# Patient Record
Sex: Male | Born: 1970
Health system: Southern US, Community
[De-identification: ages and names within clinical notes are randomized; demographics above are authoritative.]

## PROBLEM LIST (undated history)

## (undated) DIAGNOSIS — A419 Sepsis, unspecified organism: Secondary | ICD-10-CM

## (undated) DIAGNOSIS — Z992 Dependence on renal dialysis: Secondary | ICD-10-CM

## (undated) DIAGNOSIS — R112 Nausea with vomiting, unspecified: Secondary | ICD-10-CM

## (undated) DIAGNOSIS — I10 Essential (primary) hypertension: Secondary | ICD-10-CM

## (undated) DIAGNOSIS — E7800 Pure hypercholesterolemia, unspecified: Secondary | ICD-10-CM

## (undated) DIAGNOSIS — R000 Tachycardia, unspecified: Secondary | ICD-10-CM

## (undated) DIAGNOSIS — L0390 Cellulitis, unspecified: Secondary | ICD-10-CM

## (undated) DIAGNOSIS — E109 Type 1 diabetes mellitus without complications: Secondary | ICD-10-CM

## (undated) DIAGNOSIS — Z9889 Other specified postprocedural states: Secondary | ICD-10-CM

## (undated) DIAGNOSIS — E039 Hypothyroidism, unspecified: Secondary | ICD-10-CM

## (undated) DIAGNOSIS — G459 Transient cerebral ischemic attack, unspecified: Secondary | ICD-10-CM

## (undated) DIAGNOSIS — N182 Chronic kidney disease, stage 2 (mild): Secondary | ICD-10-CM

## (undated) DIAGNOSIS — K3184 Gastroparesis: Secondary | ICD-10-CM

## (undated) DIAGNOSIS — Z9641 Presence of insulin pump (external) (internal): Secondary | ICD-10-CM

## (undated) DIAGNOSIS — F1910 Other psychoactive substance abuse, uncomplicated: Secondary | ICD-10-CM

## (undated) DIAGNOSIS — E1110 Type 2 diabetes mellitus with ketoacidosis without coma: Secondary | ICD-10-CM

## (undated) DIAGNOSIS — G4733 Obstructive sleep apnea (adult) (pediatric): Secondary | ICD-10-CM

## (undated) DIAGNOSIS — N186 End stage renal disease: Secondary | ICD-10-CM

## (undated) DIAGNOSIS — M5020 Other cervical disc displacement, unspecified cervical region: Secondary | ICD-10-CM

## (undated) DIAGNOSIS — R1319 Other dysphagia: Secondary | ICD-10-CM

## (undated) HISTORY — PX: HERNIA REPAIR: SHX51

## (undated) HISTORY — PX: EYE SURGERY: SHX253

## (undated) HISTORY — DX: Pure hypercholesterolemia, unspecified: E78.00

## (undated) HISTORY — PX: PERITONEAL CATHETER INSERTION: SHX2223

## (undated) HISTORY — DX: Other psychoactive substance abuse, uncomplicated: F19.10

## (undated) HISTORY — PX: OTHER SURGICAL HISTORY: SHX169

## (undated) HISTORY — PX: CATARACT EXTRACTION: SUR2

## (undated) HISTORY — DX: Morbid (severe) obesity due to excess calories: E66.01

## (undated) HISTORY — DX: End stage renal disease: N18.6

## (undated) HISTORY — DX: Other specified postprocedural states: Z98.890

## (undated) HISTORY — DX: Gastroparesis: K31.84

## (undated) HISTORY — PX: PERITONEAL CATHETER REMOVAL: SUR1106

---

## 2009-12-15 ENCOUNTER — Encounter: Admission: RE | Admit: 2009-12-15 | Discharge: 2009-12-15 | Payer: Self-pay | Admitting: Neurological Surgery

## 2009-12-17 ENCOUNTER — Encounter: Admission: RE | Admit: 2009-12-17 | Discharge: 2009-12-17 | Payer: Self-pay | Admitting: Neurosurgery

## 2010-02-10 ENCOUNTER — Emergency Department (HOSPITAL_COMMUNITY): Admission: EM | Admit: 2010-02-10 | Discharge: 2010-02-10 | Payer: Self-pay | Admitting: Emergency Medicine

## 2010-02-17 ENCOUNTER — Encounter: Admission: RE | Admit: 2010-02-17 | Discharge: 2010-02-17 | Payer: Self-pay | Admitting: Neurological Surgery

## 2010-05-21 ENCOUNTER — Inpatient Hospital Stay (HOSPITAL_COMMUNITY): Admission: EM | Admit: 2010-05-21 | Discharge: 2010-05-23 | Payer: Self-pay | Admitting: Emergency Medicine

## 2010-10-25 ENCOUNTER — Encounter: Payer: Self-pay | Admitting: Neurological Surgery

## 2010-12-18 LAB — COMPREHENSIVE METABOLIC PANEL
ALT: 14 U/L (ref 0–53)
AST: 19 U/L (ref 0–37)
Albumin: 2.9 g/dL — ABNORMAL LOW (ref 3.5–5.2)
Alkaline Phosphatase: 63 U/L (ref 39–117)
BUN: 11 mg/dL (ref 6–23)
CO2: 26 mEq/L (ref 19–32)
Calcium: 8.7 mg/dL (ref 8.4–10.5)
Chloride: 109 mEq/L (ref 96–112)
Creatinine, Ser: 0.82 mg/dL (ref 0.4–1.5)
GFR calc Af Amer: >60 mL/min (ref >60)
GFR calc non Af Amer: >60 mL/min (ref >60)
Glucose, Bld: 106 mg/dL — ABNORMAL HIGH (ref 70–99)
Potassium: 3.8 mEq/L (ref 3.5–5.1)
Sodium: 138 mEq/L (ref 135–145)
Total Bilirubin: 1 mg/dL (ref 0.3–1.2)
Total Protein: 5.9 g/dL — ABNORMAL LOW (ref 6.0–8.3)

## 2010-12-18 LAB — BASIC METABOLIC PANEL
BUN: 10 mg/dL (ref 6–23)
BUN: 19 mg/dL (ref 6–23)
CO2: 24 mEq/L (ref 19–32)
CO2: 29 mEq/L (ref 19–32)
Calcium: 9.1 mg/dL (ref 8.4–10.5)
Calcium: 9.1 mg/dL (ref 8.4–10.5)
Chloride: 105 mEq/L (ref 96–112)
Chloride: 93 mEq/L — ABNORMAL LOW (ref 96–112)
Creatinine, Ser: 0.91 mg/dL (ref 0.4–1.5)
Creatinine, Ser: 1.13 mg/dL (ref 0.4–1.5)
GFR calc Af Amer: >60 mL/min (ref >60)
GFR calc Af Amer: >60 mL/min (ref >60)
GFR calc non Af Amer: >60 mL/min (ref >60)
GFR calc non Af Amer: >60 mL/min (ref >60)
Glucose, Bld: 768 mg/dL (ref 70–99)
Glucose, Bld: 81 mg/dL (ref 70–99)
Potassium: 3.6 mEq/L (ref 3.5–5.1)
Potassium: 5.6 mEq/L — ABNORMAL HIGH (ref 3.5–5.1)
Sodium: 126 mEq/L — ABNORMAL LOW (ref 135–145)
Sodium: 140 mEq/L (ref 135–145)

## 2010-12-18 LAB — GLUCOSE, CAPILLARY
Glucose-Capillary: 114 mg/dL — ABNORMAL HIGH (ref 70–99)
Glucose-Capillary: 115 mg/dL — ABNORMAL HIGH (ref 70–99)
Glucose-Capillary: 116 mg/dL — ABNORMAL HIGH (ref 70–99)
Glucose-Capillary: 134 mg/dL — ABNORMAL HIGH (ref 70–99)
Glucose-Capillary: 139 mg/dL — ABNORMAL HIGH (ref 70–99)
Glucose-Capillary: 153 mg/dL — ABNORMAL HIGH (ref 70–99)
Glucose-Capillary: 159 mg/dL — ABNORMAL HIGH (ref 70–99)
Glucose-Capillary: 162 mg/dL — ABNORMAL HIGH (ref 70–99)
Glucose-Capillary: 168 mg/dL — ABNORMAL HIGH (ref 70–99)
Glucose-Capillary: 169 mg/dL — ABNORMAL HIGH (ref 70–99)
Glucose-Capillary: 186 mg/dL — ABNORMAL HIGH (ref 70–99)
Glucose-Capillary: 204 mg/dL — ABNORMAL HIGH (ref 70–99)
Glucose-Capillary: 218 mg/dL — ABNORMAL HIGH (ref 70–99)
Glucose-Capillary: 223 mg/dL — ABNORMAL HIGH (ref 70–99)
Glucose-Capillary: 224 mg/dL — ABNORMAL HIGH (ref 70–99)
Glucose-Capillary: 228 mg/dL — ABNORMAL HIGH (ref 70–99)
Glucose-Capillary: 361 mg/dL — ABNORMAL HIGH (ref 70–99)
Glucose-Capillary: 361 mg/dL — ABNORMAL HIGH (ref 70–99)
Glucose-Capillary: 413 mg/dL — ABNORMAL HIGH (ref 70–99)
Glucose-Capillary: 420 mg/dL — ABNORMAL HIGH (ref 70–99)
Glucose-Capillary: 466 mg/dL — ABNORMAL HIGH (ref 70–99)
Glucose-Capillary: 73 mg/dL (ref 70–99)
Glucose-Capillary: 74 mg/dL (ref 70–99)
Glucose-Capillary: 79 mg/dL (ref 70–99)
Glucose-Capillary: 92 mg/dL (ref 70–99)
Glucose-Capillary: 93 mg/dL (ref 70–99)
Glucose-Capillary: >600 mg/dL (ref 70–99)
Glucose-Capillary: >600 mg/dL (ref 70–99)

## 2010-12-18 LAB — CLOSTRIDIUM DIFFICILE EIA: C difficile Toxins A+B, EIA: NEGATIVE

## 2010-12-18 LAB — POCT CARDIAC MARKERS
CKMB, poc: 1.8 ng/mL (ref 1.0–8.0)
Myoglobin, poc: 80.1 ng/mL (ref 12–200)
Troponin i, poc: <0.05 ng/mL (ref 0.00–0.09)

## 2010-12-18 LAB — CULTURE, BLOOD (ROUTINE X 2)
Culture: NO GROWTH
Culture: NO GROWTH

## 2010-12-18 LAB — CBC
HCT: 37.5 % — ABNORMAL LOW (ref 39.0–52.0)
HCT: 42.1 % (ref 39.0–52.0)
Hemoglobin: 13.1 g/dL (ref 13.0–17.0)
Hemoglobin: 14.6 g/dL (ref 13.0–17.0)
MCH: 29.6 pg (ref 26.0–34.0)
MCH: 30.4 pg (ref 26.0–34.0)
MCHC: 34.7 g/dL (ref 30.0–36.0)
MCHC: 34.9 g/dL (ref 30.0–36.0)
MCV: 84.7 fL (ref 78.0–100.0)
MCV: 87.5 fL (ref 78.0–100.0)
Platelets: 212 10*3/uL (ref 150–400)
Platelets: 225 10*3/uL (ref 150–400)
RBC: 4.43 MIL/uL (ref 4.22–5.81)
RBC: 4.81 MIL/uL (ref 4.22–5.81)
RDW: 12.7 % (ref 11.5–15.5)
RDW: 13 % (ref 11.5–15.5)
WBC: 6.9 10*3/uL (ref 4.0–10.5)
WBC: 8.5 10*3/uL (ref 4.0–10.5)

## 2010-12-18 LAB — URINALYSIS, ROUTINE W REFLEX MICROSCOPIC
Bilirubin Urine: NEGATIVE
Glucose, UA: >1000 mg/dL — AB
Ketones, ur: 15 mg/dL — AB
Leukocytes, UA: NEGATIVE
Nitrite: NEGATIVE
Protein, ur: NEGATIVE mg/dL
Specific Gravity, Urine: 1.027 (ref 1.005–1.030)
Urobilinogen, UA: 0.2 mg/dL (ref 0.0–1.0)
pH: 5.5 (ref 5.0–8.0)

## 2010-12-18 LAB — MRSA PCR SCREENING: MRSA by PCR: NEGATIVE

## 2010-12-18 LAB — TSH: TSH: 3.583 u[IU]/mL (ref 0.350–4.500)

## 2010-12-18 LAB — MAGNESIUM: Magnesium: 2 mg/dL (ref 1.5–2.5)

## 2010-12-18 LAB — HEMOGLOBIN A1C
Hgb A1c MFr Bld: 10.9 % — ABNORMAL HIGH (ref <5.7)
Mean Plasma Glucose: 266 mg/dL — ABNORMAL HIGH (ref <117)

## 2010-12-18 LAB — LIPASE, BLOOD
Lipase: 19 U/L (ref 11–59)
Lipase: 20 U/L (ref 11–59)

## 2010-12-18 LAB — LIPID PANEL
Cholesterol: 197 mg/dL (ref 0–200)
HDL: 57 mg/dL (ref >39)
LDL Cholesterol: 125 mg/dL — ABNORMAL HIGH (ref 0–99)
Total CHOL/HDL Ratio: 3.5 RATIO
Triglycerides: 77 mg/dL (ref <150)
VLDL: 15 mg/dL (ref 0–40)

## 2010-12-18 LAB — URINE MICROSCOPIC-ADD ON

## 2010-12-22 LAB — URINALYSIS, ROUTINE W REFLEX MICROSCOPIC
Bilirubin Urine: NEGATIVE
Glucose, UA: >1000 mg/dL — AB
Ketones, ur: 15 mg/dL — AB
Leukocytes, UA: NEGATIVE
Nitrite: NEGATIVE
Protein, ur: NEGATIVE mg/dL
Specific Gravity, Urine: 1.029 (ref 1.005–1.030)
Urobilinogen, UA: 1 mg/dL (ref 0.0–1.0)
pH: 6 (ref 5.0–8.0)

## 2010-12-22 LAB — CBC
HCT: 41.6 % (ref 39.0–52.0)
Hemoglobin: 14.3 g/dL (ref 13.0–17.0)
MCHC: 34.4 g/dL (ref 30.0–36.0)
MCV: 87.6 fL (ref 78.0–100.0)
Platelets: 250 K/uL (ref 150–400)
RBC: 4.75 MIL/uL (ref 4.22–5.81)
RDW: 13.9 % (ref 11.5–15.5)
WBC: 7.7 K/uL (ref 4.0–10.5)

## 2010-12-22 LAB — DIFFERENTIAL
Basophils Absolute: 0 10*3/uL (ref 0.0–0.1)
Basophils Relative: 0 % (ref 0–1)
Eosinophils Absolute: 0.1 10*3/uL (ref 0.0–0.7)
Eosinophils Relative: 2 % (ref 0–5)
Lymphocytes Relative: 32 % (ref 12–46)
Lymphs Abs: 2.4 10*3/uL (ref 0.7–4.0)
Monocytes Absolute: 0.6 10*3/uL (ref 0.1–1.0)
Monocytes Relative: 8 % (ref 3–12)
Neutro Abs: 4.4 10*3/uL (ref 1.7–7.7)
Neutrophils Relative %: 58 % (ref 43–77)

## 2010-12-22 LAB — LACTIC ACID, PLASMA: Lactic Acid, Venous: 1.4 mmol/L (ref 0.5–2.2)

## 2010-12-22 LAB — BASIC METABOLIC PANEL WITH GFR
BUN: 15 mg/dL (ref 6–23)
CO2: 28 meq/L (ref 19–32)
Calcium: 9.4 mg/dL (ref 8.4–10.5)
Chloride: 98 meq/L (ref 96–112)
Creatinine, Ser: 1.11 mg/dL (ref 0.4–1.5)
GFR calc non Af Amer: >60 mL/min
Glucose, Bld: 486 mg/dL — ABNORMAL HIGH (ref 70–99)
Potassium: 4.6 meq/L (ref 3.5–5.1)
Sodium: 130 meq/L — ABNORMAL LOW (ref 135–145)

## 2010-12-22 LAB — GLUCOSE, CAPILLARY
Glucose-Capillary: 208 mg/dL — ABNORMAL HIGH (ref 70–99)
Glucose-Capillary: 290 mg/dL — ABNORMAL HIGH (ref 70–99)
Glucose-Capillary: 313 mg/dL — ABNORMAL HIGH (ref 70–99)
Glucose-Capillary: 542 mg/dL — ABNORMAL HIGH (ref 70–99)

## 2010-12-22 LAB — URINE MICROSCOPIC-ADD ON

## 2010-12-23 ENCOUNTER — Ambulatory Visit (INDEPENDENT_AMBULATORY_CARE_PROVIDER_SITE_OTHER): Payer: 59

## 2010-12-23 ENCOUNTER — Inpatient Hospital Stay (INDEPENDENT_AMBULATORY_CARE_PROVIDER_SITE_OTHER)
Admission: RE | Admit: 2010-12-23 | Discharge: 2010-12-23 | Disposition: A | Discharge status: Home / Self Care | Payer: 59 | Source: Ambulatory Visit | Attending: Emergency Medicine | Admitting: Emergency Medicine

## 2010-12-23 DIAGNOSIS — K5900 Constipation, unspecified: Secondary | ICD-10-CM

## 2010-12-23 DIAGNOSIS — R109 Unspecified abdominal pain: Secondary | ICD-10-CM

## 2010-12-23 DIAGNOSIS — R319 Hematuria, unspecified: Secondary | ICD-10-CM

## 2010-12-23 LAB — POCT URINALYSIS DIP (DEVICE)
Bilirubin Urine: NEGATIVE
Glucose, UA: 500 mg/dL — AB
Ketones, ur: NEGATIVE mg/dL
Nitrite: NEGATIVE
Protein, ur: 30 mg/dL — AB
Specific Gravity, Urine: 1.015 (ref 1.005–1.030)
Urobilinogen, UA: 0.2 mg/dL (ref 0.0–1.0)
pH: 5 (ref 5.0–8.0)

## 2010-12-23 LAB — GLUCOSE, CAPILLARY: Glucose-Capillary: 199 mg/dL — ABNORMAL HIGH (ref 70–99)

## 2010-12-24 ENCOUNTER — Emergency Department (HOSPITAL_COMMUNITY): Payer: 59

## 2010-12-24 ENCOUNTER — Emergency Department (HOSPITAL_COMMUNITY)
Admission: EM | Admit: 2010-12-24 | Discharge: 2010-12-24 | Disposition: A | Discharge status: Home / Self Care | Payer: 59 | Attending: Emergency Medicine | Admitting: Emergency Medicine

## 2010-12-24 ENCOUNTER — Encounter (HOSPITAL_COMMUNITY): Payer: Self-pay | Admitting: Radiology

## 2010-12-24 DIAGNOSIS — R631 Polydipsia: Secondary | ICD-10-CM | POA: Insufficient documentation

## 2010-12-24 DIAGNOSIS — Z79899 Other long term (current) drug therapy: Secondary | ICD-10-CM | POA: Insufficient documentation

## 2010-12-24 DIAGNOSIS — R5383 Other fatigue: Secondary | ICD-10-CM | POA: Insufficient documentation

## 2010-12-24 DIAGNOSIS — R110 Nausea: Secondary | ICD-10-CM | POA: Insufficient documentation

## 2010-12-24 DIAGNOSIS — R319 Hematuria, unspecified: Secondary | ICD-10-CM | POA: Insufficient documentation

## 2010-12-24 DIAGNOSIS — Z794 Long term (current) use of insulin: Secondary | ICD-10-CM | POA: Insufficient documentation

## 2010-12-24 DIAGNOSIS — I10 Essential (primary) hypertension: Secondary | ICD-10-CM | POA: Insufficient documentation

## 2010-12-24 DIAGNOSIS — E119 Type 2 diabetes mellitus without complications: Secondary | ICD-10-CM | POA: Insufficient documentation

## 2010-12-24 DIAGNOSIS — R109 Unspecified abdominal pain: Secondary | ICD-10-CM | POA: Insufficient documentation

## 2010-12-24 DIAGNOSIS — R5381 Other malaise: Secondary | ICD-10-CM | POA: Insufficient documentation

## 2010-12-24 LAB — URINALYSIS, ROUTINE W REFLEX MICROSCOPIC
Glucose, UA: >1000 mg/dL — AB
Ketones, ur: 40 mg/dL — AB
Leukocytes, UA: NEGATIVE
Nitrite: NEGATIVE
Protein, ur: 100 mg/dL — AB
Specific Gravity, Urine: 1.028 (ref 1.005–1.030)
Urobilinogen, UA: 1 mg/dL (ref 0.0–1.0)
pH: 5.5 (ref 5.0–8.0)

## 2010-12-24 LAB — GLUCOSE, CAPILLARY: Glucose-Capillary: 288 mg/dL — ABNORMAL HIGH (ref 70–99)

## 2010-12-24 LAB — CBC
HCT: 40.6 % (ref 39.0–52.0)
Hemoglobin: 14.1 g/dL (ref 13.0–17.0)
MCH: 29.4 pg (ref 26.0–34.0)
MCHC: 34.7 g/dL (ref 30.0–36.0)
MCV: 84.8 fL (ref 78.0–100.0)
Platelets: 244 10*3/uL (ref 150–400)
RBC: 4.79 MIL/uL (ref 4.22–5.81)
RDW: 13.2 % (ref 11.5–15.5)
WBC: 11.2 10*3/uL — ABNORMAL HIGH (ref 4.0–10.5)

## 2010-12-24 LAB — POCT I-STAT, CHEM 8
BUN: 21 mg/dL (ref 6–23)
Calcium, Ion: 1.18 mmol/L (ref 1.12–1.32)
Chloride: 101 mEq/L (ref 96–112)
Creatinine, Ser: 1.5 mg/dL (ref 0.4–1.5)
Glucose, Bld: 295 mg/dL — ABNORMAL HIGH (ref 70–99)
HCT: 44 % (ref 39.0–52.0)
Hemoglobin: 15 g/dL (ref 13.0–17.0)
Potassium: 4.3 mEq/L (ref 3.5–5.1)
Sodium: 133 mEq/L — ABNORMAL LOW (ref 135–145)
TCO2: 23 mmol/L (ref 0–100)

## 2010-12-24 LAB — URINE MICROSCOPIC-ADD ON

## 2010-12-24 LAB — DIFFERENTIAL
Basophils Absolute: 0.1 10*3/uL (ref 0.0–0.1)
Basophils Relative: 0 % (ref 0–1)
Eosinophils Absolute: 0 10*3/uL (ref 0.0–0.7)
Eosinophils Relative: 0 % (ref 0–5)
Lymphocytes Relative: 15 % (ref 12–46)
Lymphs Abs: 1.6 10*3/uL (ref 0.7–4.0)
Monocytes Absolute: 2.3 10*3/uL — ABNORMAL HIGH (ref 0.1–1.0)
Monocytes Relative: 21 % — ABNORMAL HIGH (ref 3–12)
Neutro Abs: 7.2 10*3/uL (ref 1.7–7.7)
Neutrophils Relative %: 64 % (ref 43–77)

## 2010-12-24 MED ORDER — IOHEXOL 350 MG/ML SOLN
100.0000 mL | Freq: Once | INTRAVENOUS | Status: AC | PRN
  Administered 2010-12-24: 100 mL via INTRAVENOUS

## 2011-02-01 ENCOUNTER — Ambulatory Visit: Payer: 59 | Admitting: Family Medicine

## 2011-03-01 ENCOUNTER — Emergency Department (HOSPITAL_COMMUNITY): Payer: 59

## 2011-03-01 ENCOUNTER — Observation Stay (HOSPITAL_COMMUNITY)
Admission: EM | Admit: 2011-03-01 | Discharge: 2011-03-01 | Disposition: A | Discharge status: Home / Self Care | Payer: 59 | Attending: Emergency Medicine | Admitting: Emergency Medicine

## 2011-03-01 DIAGNOSIS — J40 Bronchitis, not specified as acute or chronic: Secondary | ICD-10-CM | POA: Insufficient documentation

## 2011-03-01 DIAGNOSIS — I10 Essential (primary) hypertension: Secondary | ICD-10-CM | POA: Insufficient documentation

## 2011-03-01 DIAGNOSIS — E119 Type 2 diabetes mellitus without complications: Principal | ICD-10-CM | POA: Insufficient documentation

## 2011-03-01 DIAGNOSIS — R110 Nausea: Secondary | ICD-10-CM | POA: Insufficient documentation

## 2011-03-01 LAB — CBC
HCT: 38.8 % — ABNORMAL LOW (ref 39.0–52.0)
Hemoglobin: 13.4 g/dL (ref 13.0–17.0)
MCH: 28.8 pg (ref 26.0–34.0)
MCHC: 34.5 g/dL (ref 30.0–36.0)
MCV: 83.4 fL (ref 78.0–100.0)
Platelets: 208 10*3/uL (ref 150–400)
RBC: 4.65 MIL/uL (ref 4.22–5.81)
RDW: 13 % (ref 11.5–15.5)
WBC: 10 10*3/uL (ref 4.0–10.5)

## 2011-03-01 LAB — POCT I-STAT, CHEM 8
BUN: 28 mg/dL — ABNORMAL HIGH (ref 6–23)
Calcium, Ion: 1.17 mmol/L (ref 1.12–1.32)
Chloride: 99 mEq/L (ref 96–112)
Creatinine, Ser: 1.1 mg/dL (ref 0.4–1.5)
Glucose, Bld: 569 mg/dL (ref 70–99)
HCT: 42 % (ref 39.0–52.0)
Hemoglobin: 14.3 g/dL (ref 13.0–17.0)
Potassium: 4.8 mEq/L (ref 3.5–5.1)
Sodium: 130 mEq/L — ABNORMAL LOW (ref 135–145)
TCO2: 25 mmol/L (ref 0–100)

## 2011-03-01 LAB — GLUCOSE, CAPILLARY
Glucose-Capillary: 530 mg/dL — ABNORMAL HIGH (ref 70–99)
Glucose-Capillary: 517 mg/dL — ABNORMAL HIGH (ref 70–99)
Glucose-Capillary: 536 mg/dL — ABNORMAL HIGH (ref 70–99)
Glucose-Capillary: 337 mg/dL — ABNORMAL HIGH (ref 70–99)
Glucose-Capillary: 215 mg/dL — ABNORMAL HIGH (ref 70–99)

## 2011-03-01 LAB — URINE MICROSCOPIC-ADD ON

## 2011-03-01 LAB — URINALYSIS, ROUTINE W REFLEX MICROSCOPIC
Bilirubin Urine: NEGATIVE
Glucose, UA: >1000 mg/dL — AB
Ketones, ur: NEGATIVE mg/dL
Leukocytes, UA: NEGATIVE
Nitrite: NEGATIVE
Protein, ur: NEGATIVE mg/dL
Specific Gravity, Urine: 1.029 (ref 1.005–1.030)
Urobilinogen, UA: 0.2 mg/dL (ref 0.0–1.0)
pH: 5.5 (ref 5.0–8.0)

## 2011-04-30 ENCOUNTER — Emergency Department (HOSPITAL_COMMUNITY)
Admission: EM | Admit: 2011-04-30 | Discharge: 2011-04-30 | Disposition: A | Discharge status: Home / Self Care | Payer: 59 | Attending: Emergency Medicine | Admitting: Emergency Medicine

## 2011-04-30 ENCOUNTER — Emergency Department (HOSPITAL_COMMUNITY): Payer: 59

## 2011-04-30 ENCOUNTER — Inpatient Hospital Stay (INDEPENDENT_AMBULATORY_CARE_PROVIDER_SITE_OTHER)
Admission: RE | Admit: 2011-04-30 | Discharge: 2011-04-30 | Disposition: A | Discharge status: Home / Self Care | Payer: 59 | Source: Ambulatory Visit | Attending: Family Medicine | Admitting: Family Medicine

## 2011-04-30 DIAGNOSIS — I10 Essential (primary) hypertension: Secondary | ICD-10-CM | POA: Insufficient documentation

## 2011-04-30 DIAGNOSIS — K5900 Constipation, unspecified: Secondary | ICD-10-CM | POA: Insufficient documentation

## 2011-04-30 DIAGNOSIS — Z79899 Other long term (current) drug therapy: Secondary | ICD-10-CM | POA: Insufficient documentation

## 2011-04-30 DIAGNOSIS — E119 Type 2 diabetes mellitus without complications: Secondary | ICD-10-CM | POA: Insufficient documentation

## 2011-04-30 DIAGNOSIS — R5381 Other malaise: Secondary | ICD-10-CM | POA: Insufficient documentation

## 2011-04-30 DIAGNOSIS — R112 Nausea with vomiting, unspecified: Secondary | ICD-10-CM | POA: Insufficient documentation

## 2011-04-30 DIAGNOSIS — R42 Dizziness and giddiness: Secondary | ICD-10-CM

## 2011-04-30 DIAGNOSIS — R059 Cough, unspecified: Secondary | ICD-10-CM | POA: Insufficient documentation

## 2011-04-30 DIAGNOSIS — R05 Cough: Secondary | ICD-10-CM | POA: Insufficient documentation

## 2011-04-30 DIAGNOSIS — R1915 Other abnormal bowel sounds: Secondary | ICD-10-CM | POA: Insufficient documentation

## 2011-04-30 DIAGNOSIS — E860 Dehydration: Secondary | ICD-10-CM | POA: Insufficient documentation

## 2011-04-30 DIAGNOSIS — R7989 Other specified abnormal findings of blood chemistry: Secondary | ICD-10-CM

## 2011-04-30 DIAGNOSIS — Z794 Long term (current) use of insulin: Secondary | ICD-10-CM | POA: Insufficient documentation

## 2011-04-30 LAB — POCT I-STAT, CHEM 8
BUN: 22 mg/dL (ref 6–23)
BUN: 22 mg/dL (ref 6–23)
Calcium, Ion: 1.23 mmol/L (ref 1.12–1.32)
Calcium, Ion: 1.23 mmol/L (ref 1.12–1.32)
Chloride: 102 mEq/L (ref 96–112)
Chloride: 105 mEq/L (ref 96–112)
Creatinine, Ser: 1.1 mg/dL (ref 0.50–1.35)
Creatinine, Ser: 1 mg/dL (ref 0.50–1.35)
Glucose, Bld: 334 mg/dL — ABNORMAL HIGH (ref 70–99)
Glucose, Bld: 198 mg/dL — ABNORMAL HIGH (ref 70–99)
HCT: 42 % (ref 39.0–52.0)
HCT: 39 % (ref 39.0–52.0)
Hemoglobin: 14.3 g/dL (ref 13.0–17.0)
Hemoglobin: 13.3 g/dL (ref 13.0–17.0)
Potassium: 4.7 mEq/L (ref 3.5–5.1)
Potassium: 4.2 mEq/L (ref 3.5–5.1)
Sodium: 135 mEq/L (ref 135–145)
Sodium: 136 mEq/L (ref 135–145)
TCO2: 21 mmol/L (ref 0–100)
TCO2: 22 mmol/L (ref 0–100)

## 2011-04-30 LAB — URINALYSIS, ROUTINE W REFLEX MICROSCOPIC
Bilirubin Urine: NEGATIVE
Glucose, UA: >1000 mg/dL — AB
Ketones, ur: 15 mg/dL — AB
Leukocytes, UA: NEGATIVE
Nitrite: NEGATIVE
Protein, ur: NEGATIVE mg/dL
Specific Gravity, Urine: 1.033 — ABNORMAL HIGH (ref 1.005–1.030)
Urobilinogen, UA: 0.2 mg/dL (ref 0.0–1.0)
pH: 5.5 (ref 5.0–8.0)

## 2011-04-30 LAB — URINE MICROSCOPIC-ADD ON

## 2011-04-30 LAB — GLUCOSE, CAPILLARY
Glucose-Capillary: 205 mg/dL — ABNORMAL HIGH (ref 70–99)
Glucose-Capillary: 133 mg/dL — ABNORMAL HIGH (ref 70–99)
Glucose-Capillary: 261 mg/dL — ABNORMAL HIGH (ref 70–99)
Glucose-Capillary: 283 mg/dL — ABNORMAL HIGH (ref 70–99)
Glucose-Capillary: 335 mg/dL — ABNORMAL HIGH (ref 70–99)
Glucose-Capillary: 306 mg/dL — ABNORMAL HIGH (ref 70–99)
Glucose-Capillary: 227 mg/dL — ABNORMAL HIGH (ref 70–99)

## 2011-07-01 ENCOUNTER — Emergency Department (HOSPITAL_COMMUNITY): Payer: 59

## 2011-07-01 ENCOUNTER — Emergency Department (HOSPITAL_COMMUNITY)
Admission: EM | Admit: 2011-07-01 | Discharge: 2011-07-01 | Disposition: A | Discharge status: Home / Self Care | Payer: 59 | Attending: Emergency Medicine | Admitting: Emergency Medicine

## 2011-07-01 DIAGNOSIS — R5381 Other malaise: Secondary | ICD-10-CM | POA: Insufficient documentation

## 2011-07-01 DIAGNOSIS — E119 Type 2 diabetes mellitus without complications: Secondary | ICD-10-CM | POA: Insufficient documentation

## 2011-07-01 DIAGNOSIS — I10 Essential (primary) hypertension: Secondary | ICD-10-CM | POA: Insufficient documentation

## 2011-07-01 DIAGNOSIS — R10819 Abdominal tenderness, unspecified site: Secondary | ICD-10-CM | POA: Insufficient documentation

## 2011-07-01 DIAGNOSIS — R112 Nausea with vomiting, unspecified: Secondary | ICD-10-CM | POA: Insufficient documentation

## 2011-07-01 DIAGNOSIS — J984 Other disorders of lung: Secondary | ICD-10-CM | POA: Insufficient documentation

## 2011-07-01 DIAGNOSIS — R079 Chest pain, unspecified: Secondary | ICD-10-CM | POA: Insufficient documentation

## 2011-07-01 LAB — CBC
HCT: 39.4 % (ref 39.0–52.0)
Hemoglobin: 13.6 g/dL (ref 13.0–17.0)
MCH: 29.3 pg (ref 26.0–34.0)
MCHC: 34.5 g/dL (ref 30.0–36.0)
MCV: 84.9 fL (ref 78.0–100.0)
Platelets: 309 10*3/uL (ref 150–400)
RBC: 4.64 MIL/uL (ref 4.22–5.81)
RDW: 12.8 % (ref 11.5–15.5)
WBC: 9.5 10*3/uL (ref 4.0–10.5)

## 2011-07-01 LAB — HEPATIC FUNCTION PANEL
ALT: 22 U/L (ref 0–53)
AST: 26 U/L (ref 0–37)
Albumin: 3.3 g/dL — ABNORMAL LOW (ref 3.5–5.2)
Alkaline Phosphatase: 134 U/L — ABNORMAL HIGH (ref 39–117)
Bilirubin, Direct: <0.1 mg/dL (ref 0.0–0.3)
Indirect Bilirubin: 0.3 mg/dL (ref 0.3–0.9)
Total Bilirubin: 0.4 mg/dL (ref 0.3–1.2)
Total Protein: 7.4 g/dL (ref 6.0–8.3)

## 2011-07-01 LAB — BLOOD GAS, VENOUS
Acid-base deficit: 2.8 mmol/L — ABNORMAL HIGH (ref 0.0–2.0)
Bicarbonate: 21 mEq/L (ref 20.0–24.0)
FIO2: 0.21 %
O2 Saturation: 92.4 %
Patient temperature: 37
TCO2: 18.6 mmol/L (ref 0–100)
pCO2, Ven: 35.6 mmHg — ABNORMAL LOW (ref 45.0–50.0)
pH, Ven: 7.389 — ABNORMAL HIGH (ref 7.250–7.300)
pO2, Ven: 63.5 mmHg — ABNORMAL HIGH (ref 30.0–45.0)

## 2011-07-01 LAB — URINE MICROSCOPIC-ADD ON

## 2011-07-01 LAB — BASIC METABOLIC PANEL
BUN: 18 mg/dL (ref 6–23)
CO2: 21 mEq/L (ref 19–32)
Calcium: 9.9 mg/dL (ref 8.4–10.5)
Chloride: 95 mEq/L — ABNORMAL LOW (ref 96–112)
Creatinine, Ser: 1.16 mg/dL (ref 0.50–1.35)
GFR calc Af Amer: >60 mL/min (ref >60)
GFR calc non Af Amer: >60 mL/min (ref >60)
Glucose, Bld: 199 mg/dL — ABNORMAL HIGH (ref 70–99)
Potassium: 3.5 mEq/L (ref 3.5–5.1)
Sodium: 134 mEq/L — ABNORMAL LOW (ref 135–145)

## 2011-07-01 LAB — DIFFERENTIAL
Basophils Absolute: 0 10*3/uL (ref 0.0–0.1)
Basophils Relative: 0 % (ref 0–1)
Eosinophils Absolute: 0.1 10*3/uL (ref 0.0–0.7)
Eosinophils Relative: 1 % (ref 0–5)
Lymphocytes Relative: 25 % (ref 12–46)
Lymphs Abs: 2.3 10*3/uL (ref 0.7–4.0)
Monocytes Absolute: 0.8 10*3/uL (ref 0.1–1.0)
Monocytes Relative: 9 % (ref 3–12)
Neutro Abs: 6.2 10*3/uL (ref 1.7–7.7)
Neutrophils Relative %: 65 % (ref 43–77)

## 2011-07-01 LAB — GLUCOSE, CAPILLARY
Glucose-Capillary: 229 mg/dL — ABNORMAL HIGH (ref 70–99)
Glucose-Capillary: 48 mg/dL — ABNORMAL LOW (ref 70–99)
Glucose-Capillary: 190 mg/dL — ABNORMAL HIGH (ref 70–99)

## 2011-07-01 LAB — URINALYSIS, ROUTINE W REFLEX MICROSCOPIC
Glucose, UA: >1000 mg/dL — AB
Ketones, ur: 40 mg/dL — AB
Leukocytes, UA: NEGATIVE
Nitrite: NEGATIVE
Protein, ur: 30 mg/dL — AB
Specific Gravity, Urine: 1.025 (ref 1.005–1.030)
Urobilinogen, UA: 0.2 mg/dL (ref 0.0–1.0)
pH: 5.5 (ref 5.0–8.0)

## 2011-07-01 LAB — POCT I-STAT TROPONIN I: Troponin i, poc: 0.01 ng/mL (ref 0.00–0.08)

## 2011-12-31 ENCOUNTER — Encounter (HOSPITAL_COMMUNITY): Payer: Self-pay | Admitting: *Deleted

## 2011-12-31 ENCOUNTER — Emergency Department (INDEPENDENT_AMBULATORY_CARE_PROVIDER_SITE_OTHER)
Admission: EM | Admit: 2011-12-31 | Discharge: 2011-12-31 | Disposition: A | Discharge status: Home / Self Care | Payer: 59 | Source: Home / Self Care | Attending: Family Medicine | Admitting: Family Medicine

## 2011-12-31 DIAGNOSIS — J069 Acute upper respiratory infection, unspecified: Secondary | ICD-10-CM

## 2011-12-31 MED ORDER — HYDROCOD POLST-CHLORPHEN POLST 10-8 MG/5ML PO LQCR: 5.0000 mL | Freq: Two times a day (BID) | ORAL | Status: DC

## 2011-12-31 MED ORDER — FLUTICASONE PROPIONATE 50 MCG/ACT NA SUSP: 2.0000 | Freq: Every day | NASAL | Status: DC

## 2011-12-31 MED ORDER — AZITHROMYCIN 250 MG PO TABS: ORAL_TABLET | ORAL | Status: AC

## 2011-12-31 NOTE — ED Notes (Signed)
Pt has had chest and nasal congestion for 2 days.  Today woke up with uvula swollen.

## 2011-12-31 NOTE — ED Provider Notes (Signed)
History     CSN: [id number]  Arrival date & time 12/31/11  0840   First MD Initiated Contact with Patient 12/31/11 [phone]      No chief complaint on file.   (Consider location/radiation/quality/duration/timing/severity/associated sxs/prior treatment) Patient is a 41 y.o. male presenting with URI. The history is provided by the patient.  URI The primary symptoms include ear pain, sore throat and cough. Primary symptoms do not include fever, nausea, vomiting or rash. The current episode started 3 to 5 days ago.  The onset of the illness is associated with exposure to sick contacts. Symptoms associated with the illness include congestion and rhinorrhea.    Past Medical History  Diagnosis Date  . Diabetes mellitus     No past surgical history on file.  No family history on file.  History  Substance Use Topics  . Smoking status: Not on file  . Smokeless tobacco: Not on file  . Alcohol Use: Not on file      Review of Systems  Constitutional: Negative for fever.  HENT: Positive for ear pain, congestion, sore throat and rhinorrhea.   Respiratory: Positive for cough.   Gastrointestinal: Negative for nausea and vomiting.  Skin: Negative for rash.    Allergies  Shellfish-derived products  Home Medications   Current Outpatient Rx  Name Route Sig Dispense Refill  . AZITHROMYCIN 250 MG PO TABS  Take as directed on pack 6 each 0  . HYDROCOD POLST-CPM POLST ER 10-8 MG/5ML PO LQCR Oral Take 5 mLs by mouth every 12 (twelve) hours. 115 mL 0  . FLUTICASONE PROPIONATE 50 MCG/ACT NA SUSP Nasal Place 2 sprays into the nose daily. 1 g 2    BP 133/84  Pulse 114  Temp(Src) 98.4 F (36.9 C) (Oral)  Resp 14  SpO2 98%  Physical Exam  Nursing note and vitals reviewed. Constitutional: He is oriented to person, place, and time. He appears well-developed and well-nourished.  HENT:  Head: Normocephalic.  Right Ear: External ear normal.  Left Ear: External ear normal.  Mouth/Throat:  Oropharynx is clear and moist.  Eyes: Pupils are equal, round, and reactive to light.  Neck: Normal range of motion. Neck supple.  Cardiovascular: Normal rate, normal heart sounds and intact distal pulses.   Pulmonary/Chest: Effort normal and breath sounds normal.  Abdominal: Bowel sounds are normal.  Neurological: He is alert and oriented to person, place, and time.  Skin: Skin is warm and dry.    ED Course  Procedures (including critical care time)  Labs Reviewed - No data to display No results found.   1. URI (upper respiratory infection)       MDM         Billy Fischer, MD 12/31/11 1005

## 2011-12-31 NOTE — Discharge Instructions (Signed)
Drink plenty of fluids as discussed, use medicine as prescribed, and mucinex or delsym for cough. Return or see your doctor if further problems

## 2012-02-15 ENCOUNTER — Encounter: Payer: Self-pay | Admitting: Cardiovascular Disease

## 2012-03-10 ENCOUNTER — Encounter: Payer: Self-pay | Admitting: Cardiovascular Disease

## 2012-03-10 ENCOUNTER — Encounter: Payer: Self-pay | Admitting: *Deleted

## 2012-03-13 ENCOUNTER — Encounter: Payer: Self-pay | Admitting: Cardiovascular Disease

## 2012-03-13 ENCOUNTER — Ambulatory Visit (INDEPENDENT_AMBULATORY_CARE_PROVIDER_SITE_OTHER): Payer: 59 | Admitting: Cardiovascular Disease

## 2012-03-13 VITALS — BP 156/103 | HR 98 | Ht 72.0 in | Wt 238.0 lb

## 2012-03-13 DIAGNOSIS — R079 Chest pain, unspecified: Secondary | ICD-10-CM | POA: Insufficient documentation

## 2012-03-13 DIAGNOSIS — E109 Type 1 diabetes mellitus without complications: Secondary | ICD-10-CM | POA: Insufficient documentation

## 2012-03-13 DIAGNOSIS — R000 Tachycardia, unspecified: Secondary | ICD-10-CM | POA: Insufficient documentation

## 2012-03-13 DIAGNOSIS — R0989 Other specified symptoms and signs involving the circulatory and respiratory systems: Secondary | ICD-10-CM

## 2012-03-13 DIAGNOSIS — IMO0001 Reserved for inherently not codable concepts without codable children: Secondary | ICD-10-CM

## 2012-03-13 DIAGNOSIS — R0609 Other forms of dyspnea: Secondary | ICD-10-CM

## 2012-03-13 DIAGNOSIS — R0600 Dyspnea, unspecified: Secondary | ICD-10-CM | POA: Insufficient documentation

## 2012-03-13 DIAGNOSIS — E119 Type 2 diabetes mellitus without complications: Secondary | ICD-10-CM

## 2012-03-13 NOTE — Patient Instructions (Signed)
Your physician wants you to follow-up in: [place] will receive a reminder letter in the mail two months in advance. If you don't receive a letter, please call our office to schedule the follow-up appointment. Your physician recommends that you continue on your current medications as directed. Please refer to the Current Medication list given to you today.  Your physician has requested that you have an echocardiogram. Echocardiography is a painless test that uses sound waves to create images of your heart. It provides your doctor with information about the size and shape of your heart and how well your heart's chambers and valves are working. This procedure takes approximately one hour. There are no restrictions for this procedure. DX DYSPNEA Your physician has requested that you have an exercise tolerance test. For further information please visit [email or web address]. Please also follow instruction sheet, as given. DX CHEST PAIN

## 2012-03-13 NOTE — Progress Notes (Signed)
Patient ID: [name], male   DOB: [birth year], 41 y.o.   MRN: [id number] 41 yo long term IDDM referred by Dr Soyla Murphy for diaphoresis SSCP and resting tachycardia.  Good friends with Leigh in ECG lab.  Had cath in [place] about 10 years ago when HR was high and no CAD.  DM poorly controlled but improving.  HbA1c in 8 range but had been much higher.  Diet can be an issue when he gets lazy.  He has had some atypical resting SSCP lasting minutes last couple of weeks. Associated with diaphoresis.  Had been on insulin pump before but gained too much weight.  Mild exertional dyspnea.  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  Has not had any recent cardiac testing.  TSH 3.5 in 2011.  On ACE and statin for vascular risk  Sees eye doctor regularly and no neovascularization of retina.    ROS: Denies fever, malais, weight loss, blurry vision, decreased visual acuity, cough, sputum, SOB, hemoptysis, pleuritic pain, palpitaitons, heartburn, abdominal pain, melena, lower extremity edema, claudication, or rash.  All other systems reviewed and negative   General: Affect appropriate Healthy:  appears stated age 12: normal Neck supple with no adenopathy JVP normal no bruits no thyromegaly Lungs clear with no wheezing and good diaphragmatic motion Heart:  S1/S2 no murmur,rub, gallop or click PMI normal Abdomen: benighn, BS positve, no tenderness, no AAA no bruit.  No HSM or HJR Distal pulses intact with no bruits No edema Neuro non-focal Skin warm and dry No muscular weakness  Medications Current Outpatient Prescriptions  Medication Sig Dispense Refill  . atorvastatin (LIPITOR) 20 MG tablet Take 20 mg by mouth daily.      . insulin glargine (LANTUS) 100 UNIT/ML injection Inject into the skin. Sliding scale      . insulin lispro (HUMALOG) 100 UNIT/ML injection Inject into the skin 3 (three) times daily before meals.      . insulin lispro  (HUMALOG) 100 UNIT/ML injection Inject into the skin. Sliding scale      . lisinopril (PRINIVIL,ZESTRIL) 10 MG tablet Take 10 mg by mouth daily.      . Multiple Vitamin (MULTIVITAMIN) capsule Take 1 capsule by mouth daily.        Allergies Shellfish-derived products  Family History: Family History  Problem Relation Age of Onset  . Hypertension    . Coronary artery disease    . Diabetes      Social History: History   Social History  . Marital Status: Married    Spouse Name: N/A    Number of Children: N/A  . Years of Education: N/A   Occupational History  . Not on file.   Social History Main Topics  . Smoking status: Never Smoker   . Smokeless tobacco: Not on file  . Alcohol Use: No  . Drug Use: No  . Sexually Active:    Other Topics Concern  . Not on file   Social History Narrative  . No narrative on file    Electrocardiogram:  SR rate 108 otherwise normal.    Assessment and Plan

## 2012-03-13 NOTE — Assessment & Plan Note (Signed)
NO cardiopulmonary abnormality on exam or ECG.  With relative tachycardia check echo

## 2012-03-13 NOTE — Assessment & Plan Note (Signed)
Atypical but longstanding IDDM with poor control. ECG normal.  F/U ETT

## 2012-03-13 NOTE — Assessment & Plan Note (Signed)
Improving slowly.  A1c still in 8 range F/U Dr Soyla Murphy  Poor BS control likely related to tachycardia

## 2012-03-13 NOTE — Assessment & Plan Note (Signed)
Start with ETT and echo.  Get labs from Dr Soyla Murphy  If these are ok may benefit from 24 hr holter to document normal circadian variation.  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

## 2012-03-16 ENCOUNTER — Ambulatory Visit (HOSPITAL_COMMUNITY): Payer: 59 | Attending: Cardiology | Admitting: Radiology

## 2012-03-16 DIAGNOSIS — R0600 Dyspnea, unspecified: Secondary | ICD-10-CM

## 2012-03-16 DIAGNOSIS — R0989 Other specified symptoms and signs involving the circulatory and respiratory systems: Secondary | ICD-10-CM | POA: Insufficient documentation

## 2012-03-16 DIAGNOSIS — I10 Essential (primary) hypertension: Secondary | ICD-10-CM | POA: Insufficient documentation

## 2012-03-16 DIAGNOSIS — E119 Type 2 diabetes mellitus without complications: Secondary | ICD-10-CM | POA: Insufficient documentation

## 2012-03-16 DIAGNOSIS — R079 Chest pain, unspecified: Secondary | ICD-10-CM | POA: Insufficient documentation

## 2012-03-16 DIAGNOSIS — E669 Obesity, unspecified: Secondary | ICD-10-CM | POA: Insufficient documentation

## 2012-03-16 DIAGNOSIS — R000 Tachycardia, unspecified: Secondary | ICD-10-CM | POA: Insufficient documentation

## 2012-03-16 DIAGNOSIS — R0609 Other forms of dyspnea: Secondary | ICD-10-CM | POA: Insufficient documentation

## 2012-03-16 DIAGNOSIS — R0602 Shortness of breath: Secondary | ICD-10-CM

## 2012-03-16 NOTE — Progress Notes (Signed)
Echocardiogram performed.  

## 2012-03-24 ENCOUNTER — Ambulatory Visit (INDEPENDENT_AMBULATORY_CARE_PROVIDER_SITE_OTHER): Payer: 59 | Admitting: Nurse Practitioner

## 2012-03-24 ENCOUNTER — Encounter: Payer: Self-pay | Admitting: Nurse Practitioner

## 2012-03-24 VITALS — BP 131/71 | HR 100 | Ht 72.0 in | Wt 238.0 lb

## 2012-03-24 DIAGNOSIS — R000 Tachycardia, unspecified: Secondary | ICD-10-CM

## 2012-03-24 DIAGNOSIS — R079 Chest pain, unspecified: Secondary | ICD-10-CM

## 2012-03-24 NOTE — Procedures (Signed)
Exercise Treadmill Test  Pre-Exercise Testing Evaluation Rhythm: normal sinus  Rate: 100   PR:  14 QRS:  .09  QT:  .36 QTc: .46     Test  Exercise Tolerance Test Ordering MD: Jenkins Rouge, MD  Interpreting MD: Truitt Merle , NP  Unique Test No: 1  Treadmill:  1  Indication for ETT: chest pain - rule out ischemia  Contraindication to ETT: No   Stress Modality: exercise - treadmill  Cardiac Imaging Performed: non   Protocol: standard Bruce - maximal  Max BP: 153/73  Max MPHR (bpm):  181 85% MPR (bpm):  153  MPHR obtained (bpm):  155 % MPHR obtained:  85%  Reached 85% MPHR (min:sec):  8:36 Total Exercise Time (min-sec):  9:00  Workload in METS:  10.1 Borg Scale: 14  Reason ETT Terminated:  patient's desire to stop    ST Segment Analysis At Rest: normal ST segments - no evidence of significant ST depression With Exercise: no evidence of significant ST depression  Other Information Arrhythmia:  No Angina during ETT:  absent (0) Quality of ETT:  diagnostic  ETT Interpretation:  normal - no evidence of ischemia by ST analysis  Comments: Patient presents today for GXT due to resting tachycardia and atypical chest pain. Has HTN and is diabetic. Has had a normal EF per echo but has LVH noted. He exercised today on the standard Bruce protocol for a total of 9 minutes. Good exercise tolerance. Adequate blood pressure reponse. Clinically negative for chest pain. EKG negative. Target reached at 8:36. Heart rate dropped 12 beats from his maximum HR within 45 seconds of recovery.   Recommendations: Per Dr. Kyla Balzarine recommendation, will place 24 Holter to further evaluate his tachycardia. I suspect he may have beta blocker added to his regimen. Will tentatively see him back in about 1 month if so. Risk factor modification is strongly encouraged.

## 2012-03-24 NOTE — Patient Instructions (Signed)
Will place a holter monitor next week to further evaluate your heart rate. Based on those results, you may have additional medicines added.  See Dr. Johnsie Cancel in one month.   Regular exercise is encouraged.

## 2012-03-28 ENCOUNTER — Encounter (INDEPENDENT_AMBULATORY_CARE_PROVIDER_SITE_OTHER): Payer: 59

## 2012-03-28 DIAGNOSIS — R000 Tachycardia, unspecified: Secondary | ICD-10-CM

## 2012-04-05 ENCOUNTER — Telehealth: Payer: Self-pay | Admitting: *Deleted

## 2012-04-05 NOTE — Telephone Encounter (Signed)
LEFT MESSAGE RE  HOLTER RESULTS PER DR NISHAN  SR [place] .Adonis Housekeeper

## 2012-04-05 NOTE — Telephone Encounter (Signed)
PT AWARE OF HOLTER RESULTS./CY

## 2012-05-04 ENCOUNTER — Ambulatory Visit: Payer: 59 | Admitting: Cardiovascular Disease

## 2012-06-14 ENCOUNTER — Encounter (HOSPITAL_COMMUNITY): Payer: Self-pay | Admitting: Emergency Medicine

## 2012-06-14 ENCOUNTER — Emergency Department (HOSPITAL_COMMUNITY)
Admission: EM | Admit: 2012-06-14 | Discharge: 2012-06-14 | Disposition: A | Discharge status: Home / Self Care | Payer: 59 | Attending: Emergency Medicine | Admitting: Emergency Medicine

## 2012-06-14 ENCOUNTER — Emergency Department (HOSPITAL_COMMUNITY): Payer: 59

## 2012-06-14 DIAGNOSIS — E1169 Type 2 diabetes mellitus with other specified complication: Secondary | ICD-10-CM | POA: Insufficient documentation

## 2012-06-14 DIAGNOSIS — E7800 Pure hypercholesterolemia, unspecified: Secondary | ICD-10-CM | POA: Insufficient documentation

## 2012-06-14 DIAGNOSIS — Z794 Long term (current) use of insulin: Secondary | ICD-10-CM | POA: Insufficient documentation

## 2012-06-14 DIAGNOSIS — R739 Hyperglycemia, unspecified: Secondary | ICD-10-CM

## 2012-06-14 DIAGNOSIS — K5900 Constipation, unspecified: Secondary | ICD-10-CM | POA: Insufficient documentation

## 2012-06-14 LAB — COMPREHENSIVE METABOLIC PANEL
ALT: 18 U/L (ref 0–53)
AST: 17 U/L (ref 0–37)
Albumin: 3.2 g/dL — ABNORMAL LOW (ref 3.5–5.2)
Alkaline Phosphatase: 125 U/L — ABNORMAL HIGH (ref 39–117)
BUN: 24 mg/dL — ABNORMAL HIGH (ref 6–23)
CO2: 24 mEq/L (ref 19–32)
Calcium: 9.9 mg/dL (ref 8.4–10.5)
Chloride: 94 mEq/L — ABNORMAL LOW (ref 96–112)
Creatinine, Ser: 0.97 mg/dL (ref 0.50–1.35)
GFR calc Af Amer: >90 mL/min (ref >90)
GFR calc non Af Amer: >90 mL/min (ref >90)
Glucose, Bld: 351 mg/dL — ABNORMAL HIGH (ref 70–99)
Potassium: 4.2 mEq/L (ref 3.5–5.1)
Sodium: 130 mEq/L — ABNORMAL LOW (ref 135–145)
Total Bilirubin: 0.3 mg/dL (ref 0.3–1.2)
Total Protein: 7.5 g/dL (ref 6.0–8.3)

## 2012-06-14 LAB — CBC WITH DIFFERENTIAL/PLATELET
Basophils Absolute: 0 10*3/uL (ref 0.0–0.1)
Basophils Relative: 1 % (ref 0–1)
Eosinophils Absolute: 0.2 10*3/uL (ref 0.0–0.7)
Eosinophils Relative: 2 % (ref 0–5)
HCT: 40.1 % (ref 39.0–52.0)
Hemoglobin: 14 g/dL (ref 13.0–17.0)
Lymphocytes Relative: 30 % (ref 12–46)
Lymphs Abs: 2 10*3/uL (ref 0.7–4.0)
MCH: 29.5 pg (ref 26.0–34.0)
MCHC: 34.9 g/dL (ref 30.0–36.0)
MCV: 84.4 fL (ref 78.0–100.0)
Monocytes Absolute: 0.7 10*3/uL (ref 0.1–1.0)
Monocytes Relative: 11 % (ref 3–12)
Neutro Abs: 3.8 10*3/uL (ref 1.7–7.7)
Neutrophils Relative %: 56 % (ref 43–77)
Platelets: 280 10*3/uL (ref 150–400)
RBC: 4.75 MIL/uL (ref 4.22–5.81)
RDW: 13 % (ref 11.5–15.5)
WBC: 6.7 10*3/uL (ref 4.0–10.5)

## 2012-06-14 LAB — URINALYSIS, ROUTINE W REFLEX MICROSCOPIC
Bilirubin Urine: NEGATIVE
Glucose, UA: >1000 mg/dL — AB
Ketones, ur: NEGATIVE mg/dL
Leukocytes, UA: NEGATIVE
Nitrite: NEGATIVE
Protein, ur: NEGATIVE mg/dL
Specific Gravity, Urine: 1.031 — ABNORMAL HIGH (ref 1.005–1.030)
Urobilinogen, UA: 0.2 mg/dL (ref 0.0–1.0)
pH: 5 (ref 5.0–8.0)

## 2012-06-14 LAB — URINE MICROSCOPIC-ADD ON

## 2012-06-14 LAB — GLUCOSE, CAPILLARY
Glucose-Capillary: 378 mg/dL — ABNORMAL HIGH (ref 70–99)
Glucose-Capillary: 187 mg/dL — ABNORMAL HIGH (ref 70–99)

## 2012-06-14 MED ORDER — INSULIN ASPART 100 UNIT/ML ~~LOC~~ SOLN
10.0000 [IU] | Freq: Once | SUBCUTANEOUS | Status: AC
  Administered 2012-06-14: 10 [IU] via SUBCUTANEOUS
  Filled 2012-06-14: qty 1

## 2012-06-14 MED ORDER — POLYETHYLENE GLYCOL 3350 17 GM/SCOOP PO POWD: 17.0000 g | Freq: Every day | ORAL | Status: AC

## 2012-06-14 MED ORDER — INSULIN REGULAR HUMAN 100 UNIT/ML IJ SOLN
10.0000 [IU] | Freq: Once | INTRAMUSCULAR | Status: DC
Start: 2012-06-14 — End: 2012-06-14

## 2012-06-14 MED ORDER — SODIUM CHLORIDE 0.9 % IV BOLUS (SEPSIS)
1000.0000 mL | Freq: Once | INTRAVENOUS | Status: AC
  Administered 2012-06-14: 1000 mL via INTRAVENOUS

## 2012-06-14 NOTE — ED Notes (Addendum)
Hyperglycemia- states CBG at 8:20 was 476. It was 536 at 5:30am this morning, took 20U Humalog. Has been constipated for 6 days -- no bm in 5 days, used suppository, no relief-- thinks this is making sugar go up.   Also-- bit by tick 3-4 weeks ago, pulled off umbilical area

## 2012-06-14 NOTE — ED Provider Notes (Signed)
History     CSN: [id number]  Arrival date & time 06/14/12  [id number]   First MD Initiated Contact with Patient 06/14/12 [phone]      Chief Complaint  Patient presents with  . Hyperglycemia    (Consider location/radiation/quality/duration/timing/severity/associated sxs/prior treatment) HPI Pt has had 5 days of constipation with only small, hard stool yesterday. Has tried OTC suppositories with no relief. No abdominal surgeries, passing flatus. No fever or chills. No focal area of pain.   Pt also c/o uncontrolled blood sugar. States he has been compliant with meds. No increased urination, dysuria.  Past Medical History  Diagnosis Date  . Diabetes mellitus   . Palpitations   . Tachycardia   . Gastroparesis   . Hypercholesteremia   . S/P cardiac cath 18 yrs ago    performed in the remote past; reportedly normal    History reviewed. No pertinent past surgical history.  Family History  Problem Relation Age of Onset  . Hypertension    . Coronary artery disease    . Diabetes      History  Substance Use Topics  . Smoking status: Never Smoker   . Smokeless tobacco: Not on file  . Alcohol Use: No      Review of Systems  Constitutional: Negative for fever and chills.  Respiratory: Negative for shortness of breath.   Cardiovascular: Negative for chest pain.  Gastrointestinal: Positive for constipation and abdominal distention. Negative for nausea, vomiting and abdominal pain.  Genitourinary: Negative for dysuria, frequency and flank pain.  Musculoskeletal: Negative for myalgias and back pain.  Skin: Negative for rash and wound.  Neurological: Negative for dizziness, weakness, light-headedness, numbness and headaches.    Allergies  Shellfish-derived products  Home Medications   Current Outpatient Rx  Name Route Sig Dispense Refill  . ATORVASTATIN CALCIUM 20 MG PO TABS Oral Take 20 mg by mouth daily.    . INSULIN LISPRO (HUMAN) 100 UNIT/ML [place] SOLN Subcutaneous Inject 1-10  Units into the skin 3 (three) times daily before meals. Sliding scale    . LISINOPRIL 10 MG PO TABS Oral Take 10 mg by mouth daily.    . MULTIVITAMINS PO CAPS Oral Take 1 capsule by mouth daily.    Marland Kitchen POLYETHYLENE GLYCOL 3350 PO POWD Oral Take 17 g by mouth daily. 255 g 0    BP 154/86  Pulse 108  Temp 98.3 F (36.8 C) (Oral)  Resp 18  SpO2 98%  Physical Exam  Nursing note and vitals reviewed. Constitutional: He is oriented to person, place, and time. He appears well-developed and well-nourished. No distress.  HENT:  Head: Normocephalic and atraumatic.  Mouth/Throat: Oropharynx is clear and moist.  Eyes: EOM are normal. Pupils are equal, round, and reactive to light.  Neck: Normal range of motion. Neck supple.  Cardiovascular: Normal rate and regular rhythm.   Pulmonary/Chest: Effort normal and breath sounds normal. No respiratory distress. He has no wheezes. He has no rales.  Abdominal: Soft. Bowel sounds are normal. He exhibits distension. He exhibits no mass. There is no tenderness. There is no rebound and no guarding.  Musculoskeletal: Normal range of motion. He exhibits no edema and no tenderness.  Neurological: He is alert and oriented to person, place, and time.  Skin: Skin is warm and dry. No rash noted. No erythema.  Psychiatric: He has a normal mood and affect. His behavior is normal.    ED Course  Procedures (including critical care time)  Labs Reviewed  GLUCOSE, CAPILLARY -  Abnormal; Notable for the following:    Glucose-Capillary 378 (*)     All other components within normal limits  COMPREHENSIVE METABOLIC PANEL - Abnormal; Notable for the following:    Sodium 130 (*)     Chloride 94 (*)     Glucose, Bld 351 (*)     BUN 24 (*)     Albumin 3.2 (*)     Alkaline Phosphatase 125 (*)     All other components within normal limits  URINALYSIS, ROUTINE W REFLEX MICROSCOPIC - Abnormal; Notable for the following:    Specific Gravity, Urine 1.031 (*)     Glucose, UA  >1000 (*)     Hgb urine dipstick MODERATE (*)     All other components within normal limits  GLUCOSE, CAPILLARY - Abnormal; Notable for the following:    Glucose-Capillary 187 (*)     All other components within normal limits  CBC WITH DIFFERENTIAL  URINE MICROSCOPIC-ADD ON   Dg Abd Acute W/chest  06/14/2012  *RADIOLOGY REPORT*  Clinical Data: Abdominal distension.  ACUTE ABDOMEN SERIES (ABDOMEN 2 VIEW & CHEST 1 VIEW)  Comparison: Abdominal radiograph 07/01/2011.  Findings: Lung volumes are normal.  No consolidative airspace disease.  No pleural effusions.  No pneumothorax.  No pulmonary nodule or mass noted.  Pulmonary vasculature and the cardiomediastinal silhouette are within normal limits.  No pneumoperitoneum.  Supine and upright views of the abdomen demonstrate gas and stool scattered throughout the colon extending to the level of the distal rectum.  No pathologic distension of small bowel is noted.  IMPRESSION: 1.  Nonobstructive bowel gas pattern. 2.  No pneumoperitoneum. 3.  No radiographic evidence of acute cardiopulmonary disease.   Original Report Authenticated By: Etheleen Mayhew, M.D.      1. Hyperglycemia   2. Constipation       MDM          Julianne Rice, MD 06/14/12 1128

## 2012-08-30 ENCOUNTER — Ambulatory Visit (INDEPENDENT_AMBULATORY_CARE_PROVIDER_SITE_OTHER): Payer: 59 | Admitting: Family Medicine

## 2012-08-30 DIAGNOSIS — Z131 Encounter for screening for diabetes mellitus: Secondary | ICD-10-CM

## 2012-08-30 NOTE — Progress Notes (Signed)
Patient presents today for 3 month DM follow up as part of an employee-sponsored [employer] to Wellness program. Medications, insulin regimens, and glucose readings have been reviewed. I have also discussed with patient lifestyle interventions such as diet and exercise. Details of this visit can be found in [facility] documenting program through [facility]). Patient has set a series of personal goals and will follow up in 3 months for further review of DM.

## 2012-09-05 ENCOUNTER — Encounter: Payer: Self-pay | Admitting: Family Medicine

## 2012-09-05 NOTE — Progress Notes (Signed)
Patient ID: [name], male   DOB: 11-10-1970, 41 y.o.   MRN: [id number] Reviewed and agree with this documentation and management.

## 2012-10-19 ENCOUNTER — Inpatient Hospital Stay (HOSPITAL_COMMUNITY)
Admission: EM | Admit: 2012-10-19 | Discharge: 2012-10-21 | DRG: 391 | Disposition: A | Discharge status: Home / Self Care | Payer: 59 | Attending: Internal Medicine | Admitting: Internal Medicine

## 2012-10-19 ENCOUNTER — Encounter (HOSPITAL_COMMUNITY): Payer: Self-pay | Admitting: Emergency Medicine

## 2012-10-19 ENCOUNTER — Inpatient Hospital Stay (HOSPITAL_COMMUNITY): Payer: 59

## 2012-10-19 DIAGNOSIS — R197 Diarrhea, unspecified: Secondary | ICD-10-CM

## 2012-10-19 DIAGNOSIS — Z7982 Long term (current) use of aspirin: Secondary | ICD-10-CM

## 2012-10-19 DIAGNOSIS — Z79899 Other long term (current) drug therapy: Secondary | ICD-10-CM

## 2012-10-19 DIAGNOSIS — E1010 Type 1 diabetes mellitus with ketoacidosis without coma: Secondary | ICD-10-CM | POA: Diagnosis present

## 2012-10-19 DIAGNOSIS — R110 Nausea: Secondary | ICD-10-CM

## 2012-10-19 DIAGNOSIS — Z8249 Family history of ischemic heart disease and other diseases of the circulatory system: Secondary | ICD-10-CM

## 2012-10-19 DIAGNOSIS — R079 Chest pain, unspecified: Secondary | ICD-10-CM

## 2012-10-19 DIAGNOSIS — E860 Dehydration: Secondary | ICD-10-CM

## 2012-10-19 DIAGNOSIS — J029 Acute pharyngitis, unspecified: Secondary | ICD-10-CM | POA: Diagnosis present

## 2012-10-19 DIAGNOSIS — Z794 Long term (current) use of insulin: Secondary | ICD-10-CM

## 2012-10-19 DIAGNOSIS — R000 Tachycardia, unspecified: Secondary | ICD-10-CM

## 2012-10-19 DIAGNOSIS — IMO0001 Reserved for inherently not codable concepts without codable children: Secondary | ICD-10-CM

## 2012-10-19 DIAGNOSIS — R112 Nausea with vomiting, unspecified: Secondary | ICD-10-CM

## 2012-10-19 DIAGNOSIS — E7800 Pure hypercholesterolemia, unspecified: Secondary | ICD-10-CM | POA: Diagnosis present

## 2012-10-19 DIAGNOSIS — A088 Other specified intestinal infections: Principal | ICD-10-CM | POA: Diagnosis present

## 2012-10-19 DIAGNOSIS — R0600 Dyspnea, unspecified: Secondary | ICD-10-CM

## 2012-10-19 DIAGNOSIS — R739 Hyperglycemia, unspecified: Secondary | ICD-10-CM

## 2012-10-19 DIAGNOSIS — R7309 Other abnormal glucose: Secondary | ICD-10-CM

## 2012-10-19 DIAGNOSIS — Z885 Allergy status to narcotic agent status: Secondary | ICD-10-CM

## 2012-10-19 DIAGNOSIS — E1110 Type 2 diabetes mellitus with ketoacidosis without coma: Secondary | ICD-10-CM

## 2012-10-19 DIAGNOSIS — E109 Type 1 diabetes mellitus without complications: Secondary | ICD-10-CM

## 2012-10-19 LAB — URINALYSIS, MICROSCOPIC ONLY
Bilirubin Urine: NEGATIVE
Glucose, UA: >1000 mg/dL — AB
Ketones, ur: 40 mg/dL — AB
Leukocytes, UA: NEGATIVE
Nitrite: NEGATIVE
Protein, ur: NEGATIVE mg/dL
Specific Gravity, Urine: 1.03 (ref 1.005–1.030)
Urobilinogen, UA: 0.2 mg/dL (ref 0.0–1.0)
pH: 5 (ref 5.0–8.0)

## 2012-10-19 LAB — GLUCOSE, CAPILLARY
Glucose-Capillary: 596 mg/dL (ref 70–99)
Glucose-Capillary: 520 mg/dL — ABNORMAL HIGH (ref 70–99)
Glucose-Capillary: 557 mg/dL (ref 70–99)
Glucose-Capillary: 353 mg/dL — ABNORMAL HIGH (ref 70–99)
Glucose-Capillary: 300 mg/dL — ABNORMAL HIGH (ref 70–99)
Glucose-Capillary: 254 mg/dL — ABNORMAL HIGH (ref 70–99)
Glucose-Capillary: 195 mg/dL — ABNORMAL HIGH (ref 70–99)
Glucose-Capillary: 204 mg/dL — ABNORMAL HIGH (ref 70–99)
Glucose-Capillary: 141 mg/dL — ABNORMAL HIGH (ref 70–99)
Glucose-Capillary: 168 mg/dL — ABNORMAL HIGH (ref 70–99)
Glucose-Capillary: 95 mg/dL (ref 70–99)
Glucose-Capillary: 114 mg/dL — ABNORMAL HIGH (ref 70–99)
Glucose-Capillary: 198 mg/dL — ABNORMAL HIGH (ref 70–99)

## 2012-10-19 LAB — CBC
HCT: 39 % (ref 39.0–52.0)
Hemoglobin: 13.3 g/dL (ref 13.0–17.0)
MCH: 29.6 pg (ref 26.0–34.0)
MCHC: 34.1 g/dL (ref 30.0–36.0)
MCV: 86.7 fL (ref 78.0–100.0)
Platelets: 237 10*3/uL (ref 150–400)
RBC: 4.5 MIL/uL (ref 4.22–5.81)
RDW: 13.3 % (ref 11.5–15.5)
WBC: 6.5 10*3/uL (ref 4.0–10.5)

## 2012-10-19 LAB — BASIC METABOLIC PANEL
BUN: 25 mg/dL — ABNORMAL HIGH (ref 6–23)
BUN: 23 mg/dL (ref 6–23)
BUN: 21 mg/dL (ref 6–23)
BUN: 17 mg/dL (ref 6–23)
CO2: 23 mEq/L (ref 19–32)
CO2: 24 mEq/L (ref 19–32)
CO2: 25 mEq/L (ref 19–32)
CO2: 23 mEq/L (ref 19–32)
Calcium: 8.6 mg/dL (ref 8.4–10.5)
Calcium: 8.5 mg/dL (ref 8.4–10.5)
Calcium: 8.1 mg/dL — ABNORMAL LOW (ref 8.4–10.5)
Calcium: 8 mg/dL — ABNORMAL LOW (ref 8.4–10.5)
Chloride: 101 mEq/L (ref 96–112)
Chloride: 102 mEq/L (ref 96–112)
Chloride: 106 mEq/L (ref 96–112)
Chloride: 105 mEq/L (ref 96–112)
Creatinine, Ser: 1.16 mg/dL (ref 0.50–1.35)
Creatinine, Ser: 1.13 mg/dL (ref 0.50–1.35)
Creatinine, Ser: 1.08 mg/dL (ref 0.50–1.35)
Creatinine, Ser: 1 mg/dL (ref 0.50–1.35)
GFR calc Af Amer: 90 mL/min — ABNORMAL LOW (ref >90)
GFR calc Af Amer: >90 mL/min (ref >90)
GFR calc Af Amer: >90 mL/min (ref >90)
GFR calc Af Amer: >90 mL/min (ref >90)
GFR calc non Af Amer: 77 mL/min — ABNORMAL LOW (ref >90)
GFR calc non Af Amer: 80 mL/min — ABNORMAL LOW (ref >90)
GFR calc non Af Amer: 84 mL/min — ABNORMAL LOW (ref >90)
GFR calc non Af Amer: >90 mL/min (ref >90)
Glucose, Bld: 228 mg/dL — ABNORMAL HIGH (ref 70–99)
Glucose, Bld: 172 mg/dL — ABNORMAL HIGH (ref 70–99)
Glucose, Bld: 155 mg/dL — ABNORMAL HIGH (ref 70–99)
Glucose, Bld: 130 mg/dL — ABNORMAL HIGH (ref 70–99)
Potassium: 3.9 mEq/L (ref 3.5–5.1)
Potassium: 3.5 mEq/L (ref 3.5–5.1)
Potassium: 3.6 mEq/L (ref 3.5–5.1)
Potassium: 3.8 mEq/L (ref 3.5–5.1)
Sodium: 135 mEq/L (ref 135–145)
Sodium: 137 mEq/L (ref 135–145)
Sodium: 138 mEq/L (ref 135–145)
Sodium: 136 mEq/L (ref 135–145)

## 2012-10-19 LAB — COMPREHENSIVE METABOLIC PANEL
ALT: 23 U/L (ref 0–53)
AST: 24 U/L (ref 0–37)
Albumin: 3.3 g/dL — ABNORMAL LOW (ref 3.5–5.2)
Alkaline Phosphatase: 74 U/L (ref 39–117)
BUN: 30 mg/dL — ABNORMAL HIGH (ref 6–23)
CO2: 18 mEq/L — ABNORMAL LOW (ref 19–32)
Calcium: 8.7 mg/dL (ref 8.4–10.5)
Chloride: 86 mEq/L — ABNORMAL LOW (ref 96–112)
Creatinine, Ser: 1.25 mg/dL (ref 0.50–1.35)
GFR calc Af Amer: 82 mL/min — ABNORMAL LOW (ref >90)
GFR calc non Af Amer: 71 mL/min — ABNORMAL LOW (ref >90)
Glucose, Bld: 665 mg/dL (ref 70–99)
Potassium: 5.2 mEq/L — ABNORMAL HIGH (ref 3.5–5.1)
Sodium: 127 mEq/L — ABNORMAL LOW (ref 135–145)
Total Bilirubin: 0.4 mg/dL (ref 0.3–1.2)
Total Protein: 6.8 g/dL (ref 6.0–8.3)

## 2012-10-19 LAB — CBC WITH DIFFERENTIAL/PLATELET
Basophils Absolute: 0 10*3/uL (ref 0.0–0.1)
Basophils Relative: 0 % (ref 0–1)
Eosinophils Absolute: 0.1 10*3/uL (ref 0.0–0.7)
Eosinophils Relative: 2 % (ref 0–5)
HCT: 40.4 % (ref 39.0–52.0)
Hemoglobin: 13.4 g/dL (ref 13.0–17.0)
Lymphocytes Relative: 22 % (ref 12–46)
Lymphs Abs: 2.1 10*3/uL (ref 0.7–4.0)
MCH: 29.7 pg (ref 26.0–34.0)
MCHC: 33.2 g/dL (ref 30.0–36.0)
MCV: 89.6 fL (ref 78.0–100.0)
Monocytes Absolute: 0.8 10*3/uL (ref 0.1–1.0)
Monocytes Relative: 9 % (ref 3–12)
Neutro Abs: 6.4 10*3/uL (ref 1.7–7.7)
Neutrophils Relative %: 67 % (ref 43–77)
Platelets: 234 10*3/uL (ref 150–400)
RBC: 4.51 MIL/uL (ref 4.22–5.81)
RDW: 13.2 % (ref 11.5–15.5)
WBC: 9.4 10*3/uL (ref 4.0–10.5)

## 2012-10-19 LAB — LIPASE, BLOOD: Lipase: 16 U/L (ref 11–59)

## 2012-10-19 LAB — HEMOGLOBIN A1C
Hgb A1c MFr Bld: 10.3 % — ABNORMAL HIGH (ref <5.7)
Mean Plasma Glucose: 249 mg/dL — ABNORMAL HIGH (ref <117)

## 2012-10-19 LAB — KETONES, QUALITATIVE

## 2012-10-19 MED ORDER — SODIUM CHLORIDE 0.9 % IV SOLN
INTRAVENOUS | Status: DC
  Administered 2012-10-19: 6.9 [IU]/h via INTRAVENOUS
  Administered 2012-10-19: 6.5 [IU]/h via INTRAVENOUS
  Administered 2012-10-19: 1.8 [IU]/h via INTRAVENOUS
  Administered 2012-10-19: 2.2 [IU]/h via INTRAVENOUS
  Administered 2012-10-20: 2 [IU]/h via INTRAVENOUS
  Filled 2012-10-19: qty 1

## 2012-10-19 MED ORDER — INSULIN REGULAR BOLUS VIA INFUSION
0.0000 [IU] | Freq: Three times a day (TID) | INTRAVENOUS | Status: DC
  Filled 2012-10-19: qty 10

## 2012-10-19 MED ORDER — SODIUM CHLORIDE 0.9 % IV SOLN
INTRAVENOUS | Status: DC
  Administered 2012-10-19: 11:00:00 via INTRAVENOUS

## 2012-10-19 MED ORDER — SODIUM CHLORIDE 0.9 % IV SOLN
INTRAVENOUS | Status: DC
  Administered 2012-10-20 (×2): via INTRAVENOUS
  Administered 2012-10-20: 1000 mL via INTRAVENOUS
  Administered 2012-10-20: 09:00:00 via INTRAVENOUS

## 2012-10-19 MED ORDER — SODIUM CHLORIDE 0.9 % IV SOLN
INTRAVENOUS | Status: DC
  Administered 2012-10-19: 7.2 [IU]/h via INTRAVENOUS
  Administered 2012-10-19: 9.9 [IU]/h via INTRAVENOUS
  Administered 2012-10-19: 4.6 [IU]/h via INTRAVENOUS
  Administered 2012-10-19: 5.9 [IU]/h via INTRAVENOUS
  Filled 2012-10-19: qty 1

## 2012-10-19 MED ORDER — DEXTROSE 50 % IV SOLN: 25.0000 mL | INTRAVENOUS | Status: DC | PRN

## 2012-10-19 MED ORDER — SODIUM CHLORIDE 0.9 % IV BOLUS (SEPSIS)
1000.0000 mL | Freq: Once | INTRAVENOUS | Status: AC
  Administered 2012-10-19: 1000 mL via INTRAVENOUS

## 2012-10-19 MED ORDER — ONDANSETRON HCL 4 MG/2ML IJ SOLN
4.0000 mg | Freq: Once | INTRAMUSCULAR | Status: AC
  Administered 2012-10-19: 4 mg via INTRAVENOUS
  Filled 2012-10-19: qty 2

## 2012-10-19 MED ORDER — PROMETHAZINE HCL 25 MG/ML IJ SOLN
12.5000 mg | Freq: Four times a day (QID) | INTRAMUSCULAR | Status: DC | PRN
  Administered 2012-10-19 (×2): 12.5 mg via INTRAVENOUS
  Filled 2012-10-19 (×2): qty 1

## 2012-10-19 MED ORDER — INSULIN GLARGINE 100 UNIT/ML ~~LOC~~ SOLN
20.0000 [IU] | Freq: Every day | SUBCUTANEOUS | Status: DC
  Administered 2012-10-19 – 2012-10-20 (×2): 20 [IU] via SUBCUTANEOUS

## 2012-10-19 MED ORDER — DEXTROSE-NACL 5-0.45 % IV SOLN: INTRAVENOUS | Status: DC

## 2012-10-19 MED ORDER — SODIUM CHLORIDE 0.9 % IV SOLN
1000.0000 mL | Freq: Once | INTRAVENOUS | Status: AC
  Administered 2012-10-19: 1000 mL via INTRAVENOUS

## 2012-10-19 MED ORDER — ONDANSETRON HCL 4 MG/2ML IJ SOLN: 4.0000 mg | Freq: Four times a day (QID) | INTRAMUSCULAR | Status: DC | PRN

## 2012-10-19 MED ORDER — ENOXAPARIN SODIUM 40 MG/0.4ML ~~LOC~~ SOLN
40.0000 mg | SUBCUTANEOUS | Status: DC
  Administered 2012-10-19 – 2012-10-20 (×2): 40 mg via SUBCUTANEOUS
  Filled 2012-10-19 (×3): qty 0.4

## 2012-10-19 MED ORDER — SODIUM CHLORIDE 0.9 % IV SOLN: INTRAVENOUS | Status: AC

## 2012-10-19 NOTE — ED Notes (Signed)
Pt presenting to ed with c/o elevated blood sugar with nausea and vomiting x 1 day. Pt states positive neck pain pt denies abdominal pain at this time

## 2012-10-19 NOTE — ED Provider Notes (Signed)
History     CSN: [id number]  Arrival date & time 10/19/12  0808   First MD Initiated Contact with Patient 10/19/12 [phone]      Chief Complaint  Patient presents with  . Hyperglycemia  . Nausea  . Emesis    (Consider location/radiation/quality/duration/timing/severity/associated sxs/prior treatment) Patient is a 42 y.o. male presenting with vomiting. The history is provided by the patient.  Emesis  Associated symptoms include diarrhea. Pertinent negatives include no abdominal pain, no cough, no fever and no headaches.  pt with hx iddm x 20 yrs states yesterday am onset of nvd illness. Had several watery stools, vomited several x, clear, not bloody or bilious. Today felt slightly better, but noted blood sugars were very high. No abd pain. No abd distension. No dysuria or gu c/o. No fever or chills. No syncope. Slightly lightheaded when stands. No cough or uri c/o. No fever or chills. Mild body aches.     Past Medical History  Diagnosis Date  . Diabetes mellitus   . Palpitations   . Tachycardia   . Gastroparesis   . Hypercholesteremia   . S/P cardiac cath 18 yrs ago    performed in the remote past; reportedly normal    History reviewed. No pertinent past surgical history.  Family History  Problem Relation Age of Onset  . Hypertension    . Coronary artery disease    . Diabetes      History  Substance Use Topics  . Smoking status: Never Smoker   . Smokeless tobacco: Not on file  . Alcohol Use: No      Review of Systems  Constitutional: Negative for fever.  HENT: Negative for sore throat and neck pain.   Eyes: Negative for redness.  Respiratory: Negative for cough and shortness of breath.   Cardiovascular: Negative for chest pain.  Gastrointestinal: Positive for vomiting and diarrhea. Negative for abdominal pain.  Genitourinary: Negative for dysuria and flank pain.  Musculoskeletal: Negative for back pain.  Skin: Negative for rash.  Neurological: Negative for  headaches.  Hematological: Does not bruise/bleed easily.  Psychiatric/Behavioral: Negative for confusion.    Allergies  Oxycontin and Shellfish-derived products  Home Medications   Current Outpatient Rx  Name  Route  Sig  Dispense  Refill  . ATORVASTATIN CALCIUM 40 MG PO TABS   Oral   Take 40 mg by mouth daily.         . INSULIN GLARGINE 100 UNIT/ML [place] SOLN   Subcutaneous   Inject 20 Units into the skin at bedtime.         . INSULIN LISPRO (HUMAN) 100 UNIT/ML [place] SOLN   Subcutaneous   Inject 15 Units into the skin 3 (three) times daily before meals. Sliding scale         . LISINOPRIL 20 MG PO TABS   Oral   Take 20 mg by mouth daily.         Marland Kitchen TADALAFIL 2.5 MG PO TABS   Oral   Take 1 tablet by mouth daily.         . TESTOSTERONE 20.25 MG/1.25GM (1.62%) TD GEL   Transdermal   Place 2 application onto the skin daily.           BP 145/66  Pulse 123  Temp 98.1 F (36.7 C) (Oral)  Resp 20  SpO2 100%  Physical Exam  Nursing note and vitals reviewed. Constitutional: He is oriented to person, place, and time. He appears well-developed and well-nourished. No  distress.  HENT:  Head: Atraumatic.  Nose: Nose normal.  Mouth/Throat: Oropharynx is clear and moist.  Eyes: Conjunctivae normal are normal. No scleral icterus.  Neck: Neck supple. No tracheal deviation present.       No stiffness or rigidity. c spine nt.   Cardiovascular: Regular rhythm, normal heart sounds and intact distal pulses.   Pulmonary/Chest: Effort normal and breath sounds normal. No accessory muscle usage. No respiratory distress.  Abdominal: Soft. Bowel sounds are normal. He exhibits no distension and no mass. There is no tenderness. There is no rebound and no guarding.  Genitourinary:       No cva tenderness  Musculoskeletal: Normal range of motion. He exhibits no edema and no tenderness.  Neurological: He is alert and oriented to person, place, and time.  Skin: Skin is warm and dry.    Psychiatric: He has a normal mood and affect.    ED Course  Procedures (including critical care time)  Results for orders placed during the hospital encounter of 10/19/12  CBC WITH DIFFERENTIAL      Component Value Range   WBC 9.4  4.0 - 10.5 K/uL   RBC 4.51  4.22 - 5.81 MIL/uL   Hemoglobin 13.4  13.0 - 17.0 g/dL   HCT 40.4  39.0 - 52.0 %   MCV 89.6  78.0 - 100.0 fL   MCH 29.7  26.0 - 34.0 pg   MCHC 33.2  30.0 - 36.0 g/dL   RDW 13.2  11.5 - 15.5 %   Platelets 234  150 - 400 K/uL   Neutrophils Relative 67  43 - 77 %   Neutro Abs 6.4  1.7 - 7.7 K/uL   Lymphocytes Relative 22  12 - 46 %   Lymphs Abs 2.1  0.7 - 4.0 K/uL   Monocytes Relative 9  3 - 12 %   Monocytes Absolute 0.8  0.1 - 1.0 K/uL   Eosinophils Relative 2  0 - 5 %   Eosinophils Absolute 0.1  0.0 - 0.7 K/uL   Basophils Relative 0  0 - 1 %   Basophils Absolute 0.0  0.0 - 0.1 K/uL  COMPREHENSIVE METABOLIC PANEL      Component Value Range   Sodium 127 (*) 135 - 145 mEq/L   Potassium 5.2 (*) 3.5 - 5.1 mEq/L   Chloride 86 (*) 96 - 112 mEq/L   CO2 18 (*) 19 - 32 mEq/L   Glucose, Bld 665 (*) 70 - 99 mg/dL   BUN 30 (*) 6 - 23 mg/dL   Creatinine, Ser 1.25  0.50 - 1.35 mg/dL   Calcium 8.7  8.4 - 10.5 mg/dL   Total Protein 6.8  6.0 - 8.3 g/dL   Albumin 3.3 (*) 3.5 - 5.2 g/dL   AST 24  0 - 37 U/L   ALT 23  0 - 53 U/L   Alkaline Phosphatase 74  39 - 117 U/L   Total Bilirubin 0.4  0.3 - 1.2 mg/dL   GFR calc non Af Amer 71 (*) >90 mL/min   GFR calc Af Amer 82 (*) >90 mL/min  URINALYSIS, MICROSCOPIC ONLY      Component Value Range   Color, Urine YELLOW  YELLOW   APPearance CLEAR  CLEAR   Specific Gravity, Urine 1.030  1.005 - 1.030   pH 5.0  5.0 - 8.0   Glucose, UA >1000 (*) NEGATIVE mg/dL   Hgb urine dipstick TRACE (*) NEGATIVE   Bilirubin Urine NEGATIVE  NEGATIVE  Ketones, ur 40 (*) NEGATIVE mg/dL   Protein, ur NEGATIVE  NEGATIVE mg/dL   Urobilinogen, UA 0.2  0.0 - 1.0 mg/dL   Nitrite NEGATIVE  NEGATIVE    Leukocytes, UA NEGATIVE  NEGATIVE   RBC / HPF 0-2  <3 RBC/hpf   Squamous Epithelial / LPF RARE  RARE  LIPASE, BLOOD      Component Value Range   Lipase 16  11 - 59 U/L  GLUCOSE, CAPILLARY      Component Value Range   Glucose-Capillary 596 (*) 70 - 99 mg/dL   Comment 1 Notify RN         MDM  Iv ns bolus. zofran iv.  Labs  Reviewed nursing notes and prior charts for additional history.   Additional ns iv.   Given blood sugar 655, dehydration, iv insulin via glucose stabilizer, ivf.  hospitalist service called to admit/obs, re tachycardia, dehydration, severe hyperglycemia.  Recheck hr improved mildly, sinus tach.  Elevated anion gap.  CRITICAL CARE Performed by: Mirna Mires   Total critical care time: 40  Critical care time was exclusive of separately billable procedures and treating other patients.  Critical care was necessary to treat or prevent imminent or life-threatening deterioration.  Critical care was time spent personally by me on the following activities: development of treatment plan with patient and/or surrogate as well as nursing, discussions with consultants, evaluation of patient's response to treatment, examination of patient, obtaining history from patient or surrogate, ordering and performing treatments and interventions, ordering and review of laboratory studies, ordering and review of radiographic studies, pulse oximetry and re-evaluation of patient's condition.    [facility] hospitalist says they will see right now in ed and do all admit orders.       Mirna Mires, MD 10/19/12 1105

## 2012-10-19 NOTE — H&P (Signed)
[facility] History and Physical  [name] [id number] DOB: Dec 23, 1970 DOA: 10/19/2012  Referring physician: er PCP: No primary provider on file.  Specialists:   Chief Complaint: n/v/elevated BS  HPI: [name] is a 42 y.o. male  With IDDM who comes in with a few day h/o N/V/D.  Blood sugars have been controlled up to last night when it was around 300.  He felt better this AM and went to work [employer]), when he arrived at work and vomited.  He came to the ER at that point.  Blood sugar was in there 600s when he arrived here. He has been given 2 L so far and started on an insulin gtt.    +chills +fever (subjective) No abd pain No dysuria No cough  Review of Systems: all systems reviewed, negative unless stated above  Past Medical History  Diagnosis Date  . Diabetes mellitus   . Palpitations   . Tachycardia   . Gastroparesis   . Hypercholesteremia   . S/P cardiac cath 18 yrs ago    performed in the remote past; reportedly normal   Past Surgical History  Procedure Date  . Hernia repair     bilateral  . Cervical neck fusion    Social History:  reports that he has never smoked. He has never used smokeless tobacco. He reports that he does not drink alcohol or use illicit drugs.   Allergies  Allergen Reactions  . Oxycontin (Oxycodone Hcl Er) Nausea And Vomiting  . Shellfish-Derived Products     Pt is not allergic to iodine, has had iodine in the past w/o premeds and w/ no problems    Family History  Problem Relation Age of Onset  . Hypertension    . Coronary artery disease    . Diabetes      Prior to Admission medications   Medication Sig Start Date End Date Taking? Authorizing Provider  aspirin 81 MG tablet Take 81 mg by mouth daily.   Yes Historical Provider, MD  atorvastatin (LIPITOR) 40 MG tablet Take 40 mg by mouth daily.   Yes Historical Provider, MD  insulin glargine (LANTUS) 100 UNIT/ML injection Inject 20 Units into the  skin at bedtime.   Yes Historical Provider, MD  insulin lispro (HUMALOG) 100 UNIT/ML injection Inject 15 Units into the skin 3 (three) times daily before meals. Sliding scale   Yes Historical Provider, MD  lisinopril (PRINIVIL,ZESTRIL) 20 MG tablet Take 20 mg by mouth daily.   Yes Historical Provider, MD  Testosterone (ANDROGEL) 20.25 MG/1.25GM (1.62%) GEL Place 2 application onto the skin daily.   Yes Historical Provider, MD  Tadalafil (CIALIS) 2.5 MG TABS Take 1 tablet by mouth daily.    Historical Provider, MD   Physical Exam: Filed Vitals:   10/19/12 0813 10/19/12 0833 10/19/12 1143  BP: 145/66 120/57 134/66  Pulse: 123 118 114  Temp: 98.1 F (36.7 C)    TempSrc: Oral    Resp: 20 16 20   SpO2: 100% 98% 97%     General:  A+Ox3, NAD- appears uncomforatable  Eyes: wnl  ENT: wnl  Neck: supple, no JVD  Cardiovascular: rrr- slightly tachy  Respiratory: clear anterior  Abdomen: +BS, soft, NT/ND  Skin: no rashes or lesions  Musculoskeletal: moves all 4 extremities  Psychiatric: no SI/noHI  Neurologic: CB 2-12 grossly intact  Labs on Admission:  Basic Metabolic Panel:  Lab [id number] 0845  NA 127*  K 5.2*  CL 86*  CO2 18*  GLUCOSE 665*  BUN 30*  CREATININE 1.25  CALCIUM 8.7  MG --  PHOS --   Liver Function Tests:  Lab 10/19/12 0845  AST 24  ALT 23  ALKPHOS 74  BILITOT 0.4  PROT 6.8  ALBUMIN 3.3*    Lab 10/19/12 0845  LIPASE 16  AMYLASE --   No results found for this basename: AMMONIA:5 in the last 168 hours CBC:  Lab 10/19/12 0845  WBC 9.4  NEUTROABS 6.4  HGB 13.4  HCT 40.4  MCV 89.6  PLT 234   Cardiac Enzymes: No results found for this basename: CKTOTAL:5,CKMB:5,CKMBINDEX:5,TROPONINI:5 in the last 168 hours  BNP (last 3 results) No results found for this basename: PROBNP:3 in the last 8760 hours CBG:  Lab 10/19/12 1138 10/19/12 1031 10/19/12 0820  GLUCAP 557* 520* 596*    Radiological Exams on Admission: No results  found.    Assessment/Plan Active Problems:  IDDM (insulin dependent diabetes mellitus)  Nausea & vomiting  Diarrhea  DKA (diabetic ketoacidoses)   1. DKA- protocol, once gap closed and off IV insulin along with nausea/vomiting resolved.  Will allow clears and popcicles to eat- advance as tolerated 2. N/V- alternate phenergan and zofran- clears and advance diet as tolerated 3. IDDM- hope to get back on his home regimine 4. 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

## 2012-10-19 NOTE — ED Notes (Signed)
Pt is aware that we need urine.  Sts he can not urinate at this time.

## 2012-10-20 LAB — GLUCOSE, CAPILLARY
Glucose-Capillary: 110 mg/dL — ABNORMAL HIGH (ref 70–99)
Glucose-Capillary: 141 mg/dL — ABNORMAL HIGH (ref 70–99)
Glucose-Capillary: 272 mg/dL — ABNORMAL HIGH (ref 70–99)
Glucose-Capillary: 309 mg/dL — ABNORMAL HIGH (ref 70–99)
Glucose-Capillary: 99 mg/dL (ref 70–99)

## 2012-10-20 MED ORDER — SALINE SPRAY 0.65 % NA SOLN
1.0000 | NASAL | Status: DC | PRN
  Filled 2012-10-20: qty 44

## 2012-10-20 MED ORDER — INSULIN ASPART 100 UNIT/ML ~~LOC~~ SOLN
15.0000 [IU] | Freq: Three times a day (TID) | SUBCUTANEOUS | Status: DC
  Administered 2012-10-20 – 2012-10-21 (×3): 15 [IU] via SUBCUTANEOUS

## 2012-10-20 MED ORDER — MENTHOL 3 MG MT LOZG
1.0000 | LOZENGE | OROMUCOSAL | Status: DC | PRN
  Filled 2012-10-20: qty 9

## 2012-10-20 MED ORDER — INSULIN ASPART 100 UNIT/ML ~~LOC~~ SOLN
0.0000 [IU] | Freq: Three times a day (TID) | SUBCUTANEOUS | Status: DC
  Administered 2012-10-20: 11 [IU] via SUBCUTANEOUS
  Administered 2012-10-20: 8 [IU] via SUBCUTANEOUS
  Administered 2012-10-20 – 2012-10-21 (×2): 2 [IU] via SUBCUTANEOUS

## 2012-10-20 NOTE — Progress Notes (Signed)
  RD consulted for nutrition education regarding diabetes.   Lab Results  Component Value Date   HGBA1C 10.3* 10/19/2012    RD provided "My Food Plan" handout. Discussed different food groups and their effects on blood sugar, emphasizing carbohydrate-containing foods. Provided list of carbohydrates and recommended serving sizes of common foods.  Discussed importance of controlled and consistent carbohydrate intake throughout the day. Provided examples of ways to balance meals/snacks and encouraged intake of high-fiber, whole grain complex carbohydrates. Teach back method used.  Expect good compliance.  Body mass index is 31.22 kg/(m^2). Pt meets criteria for obese based on current BMI.  Current diet order is CHO Mod Medium, patient is consuming approximately >50% of meals at this time. Labs and medications reviewed. No further nutrition interventions warranted at this time. RD contact information provided. If additional nutrition issues arise, please re-consult RD.  Brynda Greathouse, MS RD LDN Clinical Inpatient Dietitian Pager: [phone] Weekend/After hours pager: [phone]

## 2012-10-20 NOTE — Progress Notes (Signed)
Inpatient Diabetes Program Recommendations  AACE/ADA: New Consensus Statement on Inpatient Glycemic Control (2013)  Target Ranges:  Prepandial:   less than 140 mg/dL      Peak postprandial:   less than 180 mg/dL (1-2 hours)      Critically ill patients:  140 - 180 mg/dL   Reason for Visit: Hyperglycemia  42 yo male admitted with N/V/D.  Blood sugars have been controlled up to last night when it was around 300. He felt better this AM and went to work [employer]), when he arrived at work and vomited. He came to the ER at that point.  BS in 600s. GlucoStabilizer started for DKA. Gap is now closed and pt eating.  Results for ZOLTON, CHALIFOUR (MRN [id number]) as of 10/20/2012 15:38  Ref. Range 10/19/2012 14:51  Hemoglobin A1C Latest Range: <5.7 % 10.3 (H)  Results for JAHVON, DUREN (MRN [id number]) as of 10/20/2012 15:38  Ref. Range 10/19/2012 20:28  Sodium Latest Range: 135-145 mEq/L 136  Potassium Latest Range: 3.5-5.1 mEq/L 3.8  Chloride Latest Range: 96-112 mEq/L 105  CO2 Latest Range: 19-32 mEq/L 23  BUN Latest Range: 6-23 mg/dL 17  Creatinine Latest Range: 0.50-1.35 mg/dL 1.00  Calcium Latest Range: 8.4-10.5 mg/dL 8.0 (L)  GFR calc non Af Amer Latest Range: >90 mL/min >90  GFR calc Af Amer Latest Range: >90 mL/min >90  Glucose Latest Range: 70-99 mg/dL 130 (H)  Results for ELIAV, JUMA (MRN [id number]) as of 10/20/2012 15:38  Ref. Range 10/19/2012 19:50 10/19/2012 21:03 10/19/2012 22:13 10/19/2012 23:19 10/20/2012 00:22 10/20/2012 07:51 10/20/2012 12:15  Glucose-Capillary Latest Range: 70-99 mg/dL 168 (H) 95 114 (H) 198 (H) 110 (H) 309 (H) 272 (H)   Gap closed and basal/bolus insulin therapy to manage DM.  Ate lunch without nausea.   Inpatient Diabetes Program Recommendations Outpatient Referral: Continue f/u with [facility] as part of Link to Wellness Program  Thank you. Lorenda Peck, RD, LDN, CDE Inpatient Diabetes Coordinator [phone]

## 2012-10-20 NOTE — Progress Notes (Signed)
[facility] PROGRESS NOTE  [name] [id number] DOB: 09-10-1971 DOA: 10/19/2012 PCP: No primary provider on file.  Assessment/Plan: 1. DKA (mild)- gap closed last PM, able to eat so placed back on lantus and SSI and 15 units with meals, continue IVF (BS 110-309) 2. Viral gastroenteritis- N/V better but not resolved, wife sick now, symptomatic treatment 3. Sore throat- no thrush, Cepacol given  Code Status: full Family Communication: patient at bedside Disposition Plan: home Saturday if symptoms resolved   Consultants:  none  Procedures:  none  Antibiotics:  none  HPI/Subjective: Wife is now sick with same symptoms C/o sore throat Eating but still having nausea after No BM since last night  Objective: Filed Vitals:   10/19/12 1830 10/20/12 0035 10/20/12 0309 10/20/12 0705  BP: 126/79 107/65 115/75 130/77  Pulse: 99 95 94 102  Temp: 98.4 F (36.9 C) 98 F (36.7 C) 97.6 F (36.4 C) 97.7 F (36.5 C)  TempSrc: Oral Oral Oral Oral  Resp: 18 16 16 16   Height:      Weight:      SpO2: 98% 93% 94% 99%    Intake/Output Summary (Last 24 hours) at 10/20/12 0812 Last data filed at 10/19/12 1745  Gross per 24 hour  Intake    990 ml  Output      2 ml  Net    988 ml   Filed Weights   10/19/12 1445  Weight: 104.4 kg (230 lb 2.6 oz)    Exam:   General:  A+Ox3, NAD  Cardiovascular: rrr  Respiratory: clear anterior  Abdomen: +BS, soft, NT, ND  Data Reviewed: Basic Metabolic Panel:  Lab [id number] 10/19/12 1810 10/19/12 1559 10/19/12 1451 10/19/12 0845  NA 136 138 137 135 127*  K 3.8 3.6 3.5 3.9 5.2*  CL 105 106 102 101 86*  CO2 23 25 24 23  18*  GLUCOSE 130* 155* 172* 228* 665*  BUN 17 21 23  25* 30*  CREATININE 1.00 1.08 1.13 1.16 1.25  CALCIUM 8.0* 8.1* 8.5 8.6 8.7  MG -- -- -- -- --  PHOS -- -- -- -- --   Liver Function Tests:  Lab 10/19/12 0845  AST 24  ALT 23  ALKPHOS 74  BILITOT 0.4  PROT 6.8  ALBUMIN 3.3*    Lab  10/19/12 0845  LIPASE 16  AMYLASE --   No results found for this basename: AMMONIA:5 in the last 168 hours CBC:  Lab 10/19/12 1451 10/19/12 0845  WBC 6.5 9.4  NEUTROABS -- 6.4  HGB 13.3 13.4  HCT 39.0 40.4  MCV 86.7 89.6  PLT 237 234   Cardiac Enzymes: No results found for this basename: CKTOTAL:5,CKMB:5,CKMBINDEX:5,TROPONINI:5 in the last 168 hours BNP (last 3 results) No results found for this basename: PROBNP:3 in the last 8760 hours CBG:  Lab 10/20/12 0751 10/20/12 0022 10/19/12 2319 10/19/12 2213 10/19/12 2103  GLUCAP 309* 110* 198* 114* 95    No results found for this or any previous visit (from the past 240 hour(s)).   Studies: Dg Chest 2 View  10/19/2012  *RADIOLOGY REPORT*  Clinical Data: Shortness of breath.  CHEST - 2 VIEW  Comparison: Two-view chest 07/01/2011.  Findings: The heart size is normal.  Chronic elevation of left hemidiaphragm is again noted.  A small left pleural effusion and atelectasis is now present.  The visualized soft tissues and bony thorax are unremarkable.  IMPRESSION:  1.  Small left pleural effusion and minimal left basilar atelectasis. 2.  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   Original Report Authenticated By: San Morelle, M.D.     Scheduled Meds:   . enoxaparin (LOVENOX) injection  40 mg Subcutaneous Q24H  . insulin aspart  0-15 Units Subcutaneous TID WC  . insulin glargine  20 Units Subcutaneous QHS   Continuous Infusions:   . sodium chloride 1,000 mL (10/20/12 0054)    Active Problems:  IDDM (insulin dependent diabetes mellitus)  Nausea & vomiting  Diarrhea  DKA (diabetic ketoacidoses)    Time spent: [place], [place] [facility] Pager [phone]. If 8PM-8AM, please contact night-coverage at [email or web address], password [facility] 10/20/2012, 8:12 AM  LOS: 1 day

## 2012-10-20 NOTE — Care Management Note (Unsigned)
    Page 1 of 1   10/20/2012     6:04:15 PM   CARE MANAGEMENT NOTE 10/20/2012  Patient:  [name], [name]   Account Number:  [id number]  Date Initiated:  10/20/2012  Documentation initiated by:  Dessa Phi  Subjective/Objective Assessment:   ADMITTED W/N/V.HX:DM     Action/Plan:   FROM HOME W/SPOUSE.HAS PCP,PHARMACY.   Anticipated DC Date:  10/23/2012   Anticipated DC Plan:  [place]  CM consult      Choice offered to / List presented to:             Status of service:  In process, will continue to follow Medicare Important Message given?   (If response is "NO", the following Medicare IM given date fields will be blank) Date Medicare IM given:   Date Additional Medicare IM given:    Discharge Disposition:    Per UR Regulation:  Reviewed for med. necessity/level of care/duration of stay  If discussed at [place] of Stay Meetings, dates discussed:    Comments:  10/20/12 [facility] Elease Swarm RN,BSN NCM [id number]

## 2012-10-21 DIAGNOSIS — R112 Nausea with vomiting, unspecified: Secondary | ICD-10-CM

## 2012-10-21 DIAGNOSIS — Z794 Long term (current) use of insulin: Secondary | ICD-10-CM

## 2012-10-21 DIAGNOSIS — E119 Type 2 diabetes mellitus without complications: Secondary | ICD-10-CM

## 2012-10-21 LAB — BASIC METABOLIC PANEL
BUN: 6 mg/dL (ref 6–23)
CO2: 26 mEq/L (ref 19–32)
Calcium: 8.3 mg/dL — ABNORMAL LOW (ref 8.4–10.5)
Chloride: 107 mEq/L (ref 96–112)
Creatinine, Ser: 0.94 mg/dL (ref 0.50–1.35)
GFR calc Af Amer: >90 mL/min (ref >90)
GFR calc non Af Amer: >90 mL/min (ref >90)
Glucose, Bld: 150 mg/dL — ABNORMAL HIGH (ref 70–99)
Potassium: 3.6 mEq/L (ref 3.5–5.1)
Sodium: 138 mEq/L (ref 135–145)

## 2012-10-21 LAB — GLUCOSE, CAPILLARY: Glucose-Capillary: 146 mg/dL — ABNORMAL HIGH (ref 70–99)

## 2012-10-21 LAB — CBC
HCT: 35 % — ABNORMAL LOW (ref 39.0–52.0)
Hemoglobin: 11.8 g/dL — ABNORMAL LOW (ref 13.0–17.0)
MCH: 29.4 pg (ref 26.0–34.0)
MCHC: 33.7 g/dL (ref 30.0–36.0)
MCV: 87.3 fL (ref 78.0–100.0)
Platelets: 222 10*3/uL (ref 150–400)
RBC: 4.01 MIL/uL — ABNORMAL LOW (ref 4.22–5.81)
RDW: 13.5 % (ref 11.5–15.5)
WBC: 5.4 10*3/uL (ref 4.0–10.5)

## 2012-10-21 MED ORDER — UNABLE TO FIND: Status: DC

## 2012-10-21 MED ORDER — MENTHOL 3 MG MT LOZG: 1.0000 | LOZENGE | OROMUCOSAL | Status: DC | PRN

## 2012-10-21 NOTE — Progress Notes (Signed)
[facility] PROGRESS NOTE  [name] [id number] DOB: Feb 04, 1971 DOA: 10/19/2012 PCP: Nicoletta Dress, MD  Assessment/Plan: DKA (mild)/Uncontrolled diabeties Anion gap on admission was 23, anion gap closed on insulin drip. Transitioned back to home lantus and meal time insulin. SSI, on diabetic diet.  Viral gastroenteritis N/V resolved. Diarrhea resolved. Patient was instructed that if he has any futher diarrhea after discharge then to contact his supervisor and follow protocol as he works at [employer]. Patient understood the instructions, otherwise he is able to return to work on 10/23/2012 without restrictions.  Sore throat No thrush, Cepacol as needed. Cough at times, improved.  Code Status: Full Family Communication: Patient at bedside Disposition Plan: DC home today   Consultants:  none  Procedures:  none  Antibiotics:  none  HPI/Subjective: Had [name] coughing episode last night, which resolved. Diarrhea resolved, now forming solid stools.  Objective: Filed Vitals:   10/20/12 1552 10/20/12 2025 10/21/12 0149 10/21/12 0706  BP: 152/90 152/100 149/87 146/91  Pulse: 99 96 87 68  Temp: 98.4 F (36.9 C) 98.7 F (37.1 C) 98.4 F (36.9 C) 97.8 F (36.6 C)  TempSrc: Oral Oral Oral Oral  Resp: 18  16 18   Height:      Weight:      SpO2: 98% 97% 98% 97%    Intake/Output Summary (Last 24 hours) at 10/21/12 [id number] Last data filed at 10/21/12 0600  Gross per 24 hour  Intake   3475 ml  Output      0 ml  Net   3475 ml   Filed Weights   10/19/12 1445  Weight: 104.4 kg (230 lb 2.6 oz)    Exam: Physical Exam: General: Awake, Oriented, No acute distress. HEENT: EOMI. Neck: Supple CV: S1 and S2 Lungs: Clear to ascultation bilaterally Abdomen: Soft, Nontender, Nondistended, +bowel sounds. Ext: Good pulses. Trace edema.  Data Reviewed: Basic Metabolic Panel:  Lab [id number] 10/19/12 2028 10/19/12 1810 10/19/12 1559 10/19/12 1451  NA  138 136 138 137 135  K 3.6 3.8 3.6 3.5 3.9  CL 107 105 106 102 101  CO2 26 23 25 24 23   GLUCOSE 150* 130* 155* 172* 228*  BUN 6 17 21 23  25*  CREATININE 0.94 1.00 1.08 1.13 1.16  CALCIUM 8.3* 8.0* 8.1* 8.5 8.6  MG -- -- -- -- --  PHOS -- -- -- -- --   Liver Function Tests:  Lab 10/19/12 0845  AST 24  ALT 23  ALKPHOS 74  BILITOT 0.4  PROT 6.8  ALBUMIN 3.3*    Lab 10/19/12 0845  LIPASE 16  AMYLASE --   No results found for this basename: AMMONIA:5 in the last 168 hours CBC:  Lab 10/21/12 0552 10/19/12 1451 10/19/12 0845  WBC 5.4 6.5 9.4  NEUTROABS -- -- 6.4  HGB 11.8* 13.3 13.4  HCT 35.0* 39.0 40.4  MCV 87.3 86.7 89.6  PLT 222 237 234   Cardiac Enzymes: No results found for this basename: CKTOTAL:5,CKMB:5,CKMBINDEX:5,TROPONINI:5 in the last 168 hours BNP (last 3 results) No results found for this basename: PROBNP:3 in the last 8760 hours CBG:  Lab 10/21/12 0813 10/20/12 2138 10/20/12 1728 10/20/12 1215 10/20/12 0751  GLUCAP 146* 99 141* 272* 309*    No results found for this or any previous visit (from the past 240 hour(s)).   Studies: Dg Chest 2 View  10/19/2012  *RADIOLOGY REPORT*  Clinical Data: Shortness of breath.  CHEST - 2 VIEW  Comparison: Two-view chest 07/01/2011.  Findings:  The heart size is normal.  Chronic elevation of left hemidiaphragm is again noted.  [name] small left pleural effusion and atelectasis is now present.  The visualized soft tissues and bony thorax are unremarkable.  IMPRESSION:  1.  Small left pleural effusion and minimal left basilar atelectasis. 2.  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   Original Report Authenticated By: [name], M.D.     Scheduled Meds:    . enoxaparin (LOVENOX) injection  40 mg Subcutaneous Q24H  . insulin aspart  0-15 Units Subcutaneous TID WC  . insulin aspart  15 Units Subcutaneous TID WC  . insulin glargine  20 Units Subcutaneous QHS   Continuous Infusions:    . sodium chloride  150 mL/hr at 10/20/12 2142    Active Problems:  IDDM (insulin dependent diabetes mellitus)  Nausea & vomiting  Diarrhea  DKA (diabetic ketoacidoses)    [name]  [facility] Pager [phone]. If 8PM-8AM, please contact night-coverage at [email or web address], password [facility] 10/21/2012, 8:22 AM  LOS: 2 days

## 2012-10-21 NOTE — Discharge Summary (Signed)
Physician Discharge Summary  [name] [id number] DOB: Sep 14, 1971 DOA: 10/19/2012  PCP: Nicoletta Dress, MD  Admit date: 10/19/2012 Discharge date: 10/21/2012  Time spent: 25 minutes  Recommendations for Outpatient Follow-up:  1. Followup with SCHULTZ,DOUGLAS E, MD (PCP) in 1 week.  Discharge Diagnoses:  Active Problems:  IDDM (insulin dependent diabetes mellitus)  Nausea & vomiting  Diarrhea  DKA (diabetic ketoacidoses)   Discharge Condition: Stable  Diet recommendation: Diabetic diet  Filed Weights   10/19/12 1445  Weight: 104.4 kg (230 lb 2.6 oz)    History of present illness:  On admission: "[name] is a 42 y.o. male with IDDM who comes in with a few day h/o N/V/D. Blood sugars have been controlled up to last night when it was around 300. He felt better this AM and went to work [employer]), when he arrived at work and vomited. He came to the ER at that point. Blood sugar was in there 600s when he arrived here. He has been given 2 L so far and started on an insulin gtt"   Hospital Course:  DKA (mild)/Uncontrolled diabeties Anion gap on admission was 23, anion gap closed on insulin drip and IV fluids. Transitioned back to home lantus and meal time insulin. SSI, on diabetic diet.  Viral gastroenteritis N/V resolved. Diarrhea resolved. Patient was instructed that if he has any futher diarrhea after discharge then to contact his supervisor and follow protocol as he works at [employer]. Patient understood the instructions, otherwise he is able to return to work on 10/23/2012 without restrictions.  Sore throat No thrush, Cepacol as needed. Cough at times, improved.  Procedures:  None  Consultations:  None  Discharge Exam: Filed Vitals:   10/20/12 1552 10/20/12 2025 10/21/12 0149 10/21/12 0706  BP: 152/90 152/100 149/87 146/91  Pulse: 99 96 87 68  Temp: 98.4 F (36.9 C) 98.7 F (37.1 C) 98.4 F (36.9 C) 97.8 F (36.6 C)    TempSrc: Oral Oral Oral Oral  Resp: 18  16 18   Height:      Weight:      SpO2: 98% 97% 98% 97%   Discharge Instructions  Discharge Orders    Future Orders Please Complete By Expires   Diet Carb Modified      Increase activity slowly      Discharge instructions      Comments:   Followup with SCHULTZ,DOUGLAS E, MD (PCP) in 1 week.       Medication List     As of 10/21/2012  8:34 AM    TAKE these medications         ANDROGEL 20.25 MG/1.25GM (1.62%) Gel   Generic drug: Testosterone   Place 2 application onto the skin daily.      aspirin 81 MG tablet   Take 81 mg by mouth daily.      atorvastatin 40 MG tablet   Commonly known as: LIPITOR   Take 40 mg by mouth daily.      CIALIS 2.5 MG Tabs   Generic drug: Tadalafil   Take 1 tablet by mouth daily.      insulin glargine 100 UNIT/ML injection   Commonly known as: LANTUS   Inject 20 Units into the skin at bedtime.      insulin lispro 100 UNIT/ML injection   Commonly known as: HUMALOG   Inject 15 Units into the skin 3 (three) times daily before meals. Sliding scale      lisinopril 20 MG tablet  Commonly known as: PRINIVIL,ZESTRIL   Take 20 mg by mouth daily.      menthol-cetylpyridinium 3 MG lozenge   Commonly known as: CEPACOL   Take 1 lozenge (3 mg total) by mouth as needed (Sore throat).      UNABLE TO FIND   Please excuse Mr. Fouche from any work obligations from 10/19/2012 till 10/21/2012 as patient was hospitalized. Patient to return to work on 10/22/2012 without any restrictions.           Follow-up Information    Follow up with SCHULTZ,DOUGLAS E, MD. Schedule an appointment as soon as possible for a visit in 1 week.   Contact information:   [place] [address] [phone]           The results of significant diagnostics from this hospitalization (including imaging, microbiology, ancillary and laboratory) are listed below for reference.    Significant Diagnostic Studies: Dg  Chest 2 View  10/19/2012  *RADIOLOGY REPORT*  Clinical Data: Shortness of breath.  CHEST - 2 VIEW  Comparison: Two-view chest 07/01/2011.  Findings: The heart size is normal.  Chronic elevation of left hemidiaphragm is again noted.  A small left pleural effusion and atelectasis is now present.  The visualized soft tissues and bony thorax are unremarkable.  IMPRESSION:  1.  Small left pleural effusion and minimal left basilar atelectasis. 2.  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   Original Report Authenticated By: San Morelle, M.D.     Microbiology: No results found for this or any previous visit (from the past 240 hour(s)).   Labs: Basic Metabolic Panel:  Lab [id number] 10/19/12 2028 10/19/12 1810 10/19/12 1559 10/19/12 1451  NA 138 136 138 137 135  K 3.6 3.8 3.6 3.5 3.9  CL 107 105 106 102 101  CO2 26 23 25 24 23   GLUCOSE 150* 130* 155* 172* 228*  BUN 6 17 21 23  25*  CREATININE 0.94 1.00 1.08 1.13 1.16  CALCIUM 8.3* 8.0* 8.1* 8.5 8.6  MG -- -- -- -- --  PHOS -- -- -- -- --   Liver Function Tests:  Lab 10/19/12 0845  AST 24  ALT 23  ALKPHOS 74  BILITOT 0.4  PROT 6.8  ALBUMIN 3.3*    Lab 10/19/12 0845  LIPASE 16  AMYLASE --   No results found for this basename: AMMONIA:5 in the last 168 hours CBC:  Lab 10/21/12 0552 10/19/12 1451 10/19/12 0845  WBC 5.4 6.5 9.4  NEUTROABS -- -- 6.4  HGB 11.8* 13.3 13.4  HCT 35.0* 39.0 40.4  MCV 87.3 86.7 89.6  PLT 222 237 234   Cardiac Enzymes: No results found for this basename: CKTOTAL:5,CKMB:5,CKMBINDEX:5,TROPONINI:5 in the last 168 hours BNP: BNP (last 3 results) No results found for this basename: PROBNP:3 in the last 8760 hours CBG:  Lab 10/21/12 0813 10/20/12 2138 10/20/12 1728 10/20/12 1215 10/20/12 0751  GLUCAP 146* 99 141* 272* 309*    Signed:  Kingston Shawgo A  [facility] 10/21/2012, 8:34 AM

## 2012-11-18 ENCOUNTER — Other Ambulatory Visit: Payer: Self-pay

## 2012-11-29 ENCOUNTER — Ambulatory Visit (HOSPITAL_BASED_OUTPATIENT_CLINIC_OR_DEPARTMENT_OTHER): Admission: RE | Admit: 2012-11-29 | Payer: 59 | Source: Ambulatory Visit | Admitting: Orthopedic Surgery

## 2012-11-29 ENCOUNTER — Encounter (HOSPITAL_BASED_OUTPATIENT_CLINIC_OR_DEPARTMENT_OTHER): Admission: RE | Payer: Self-pay | Source: Ambulatory Visit

## 2012-11-29 SURGERY — RELEASE, DUPUYTREN CONTRACTURE
Anesthesia: General | Site: Hand | Laterality: Left

## 2012-12-20 ENCOUNTER — Encounter: Payer: Self-pay | Admitting: *Deleted

## 2012-12-20 ENCOUNTER — Encounter: Payer: 59 | Attending: Endocrinology | Admitting: *Deleted

## 2012-12-20 DIAGNOSIS — Z713 Dietary counseling and surveillance: Secondary | ICD-10-CM | POA: Insufficient documentation

## 2012-12-20 DIAGNOSIS — E1110 Type 2 diabetes mellitus with ketoacidosis without coma: Secondary | ICD-10-CM

## 2012-12-20 DIAGNOSIS — E1310 Other specified diabetes mellitus with ketoacidosis without coma: Secondary | ICD-10-CM | POA: Insufficient documentation

## 2012-12-20 DIAGNOSIS — IMO0001 Reserved for inherently not codable concepts without codable children: Secondary | ICD-10-CM

## 2012-12-20 NOTE — Progress Notes (Signed)
Introduction to Insulin Pump Therapy:  Appt start time: 1630 end time:  1730.  Assessment:  This patient has DM 1 and their primary concerns today: wants update on carb counting. Also had recent hypoglycemia that required hospitalization so planning to obtain Minimed 530-G with Enlite sensor which has been ordered and should arrive the end of this week.  MEDICATIONS: Basal Insulin: 25 units of Lantus at bedtime via pen Bolus Insulin: 12 units of Humnalog at meals plus sliding scale  via pen  Total of insulin doses per day 4-5 Other diabetes medications: none  Current BG meter is One Touch Mini and patient states they are testing 4 times per day Patient does not currently have Ketone Strips  This patient is currently adjusting bolus insulin based BG with a sliding scale ( 2 units for 50 points over 150) This patient is not currently adjusting bolus insulin based on carb intake. 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  Patient currently is working as a Dealer for [employer] and the schedule is erratic with long hours  Progress Towards Obtaining an Insulin PumpGoal(s):  In progress.  Patient states their expectations of pump therapy include: He has been on a pump before, is familiar with basic concepts of therapy and potential for improved control to postpone or prevent complications. Patient expresses understanding that for improved outcomes for their diabetes on an insulin pump they will:  Check BG 4-6 times per  day  Change out pump infusion set at least every 3 days  Upload pump information to software on a regular basis so provider can assess patterns and make setting adjustments.     Intervention:    Taught difference between delivery of insulin via syringe/pen compared to insulin pump.  Demonstrated improved insulin delivery via pump due to improved accuracy of dose and flexibility of adjusting bolus insulin based on carb intake and BG correction.  Explained importance of testing BG at least 4 times per day for appropriate correction of high BG and prevention of DKA as applicable.  Emphasized importance of follow up after Pump Start for appropriate pump setting adjustments and on-going training on more advanced features.  Plan:  Consider paying more attention to your foods based on which ones are Carb Choices Consider reading food labels for Total Carbohydrate of foods Go to the Medtronicdiabetes.com web-site to complete the MyLearning tutorial on the 530G pump Consider checking BG at alternate times per day as directed by MD  Take your last dose of Lantus the evening prior to the pump start On the day of your pump start, take Humalog per meals as usual. Bring 2 reservoirs, 2 infusion sets, the pump and all that is in the pump box and your Humalog insulin to pump start appointment Bring the meter and strips to pump start appointment   Handouts given during visit include: Meal Planning Handout Carb Counting and Food Label handouts Meal Plan Card  Intro To Pumping Handout  Monitoring/Evaluation:    Patient instructed to provide a 3 day food diary to demonstrate understanding  of carb counting   Patient instructed to go to Med[employer] pump web-site to complete learning module on insulin pump and bring Certificate of Completion to next visit in 3 weeks for pump start.

## 2012-12-20 NOTE — Patient Instructions (Addendum)
Plan:  Consider paying more attention to your foods based on which ones are Carb Choices Consider reading food labels for Total Carbohydrate of foods Go to the Medtronicdiabetes.com web-site to complete the MyLearning tutorial on the 530G pump Consider checking BG at alternate times per day as directed by MD  Take your last dose of Lantus the evening prior to the pump start On the day of your pump start, DO take Humalog per meals as usual. Bring 2 reservoirs, 2 infusion sets, the pump and all that is in the pump box and your Humalog insulin to pump start appointment Bring the meter and strips to pump start appointment

## 2013-01-10 ENCOUNTER — Encounter: Payer: 59 | Attending: Endocrinology | Admitting: *Deleted

## 2013-01-10 DIAGNOSIS — Z713 Dietary counseling and surveillance: Secondary | ICD-10-CM | POA: Insufficient documentation

## 2013-01-10 DIAGNOSIS — E1310 Other specified diabetes mellitus with ketoacidosis without coma: Secondary | ICD-10-CM | POA: Insufficient documentation

## 2013-01-10 NOTE — Progress Notes (Signed)
  Pump Follow Up Progress Note  Orders received from MD giving me permission to make insulin pump adjustments for the following patient.  Patient was started on Medtronic 530G insulin pump last week, being trained by [place]. He is here for his first follow up visit post pump start. He states he is already feeling better, is resting better and not having to get up to go to the bathroom as much during the night, and feels less stress from his diabetes now.   Reviewed blood glucose logs on 01/10/2013 via: [facility] and found the following:            Hypoglycemia Hyperglycemia Comments  Overnight Period:   YES Basal  Pre-Meal:    Breakfast  YES Basal   Lunch  YES Basal   Supper  YES Basal  Post-Meal: Breakfast      Lunch      Supper     Bedtime:       Comments: Although he is having occasional hypoglycemia, his average BG for past week is 160 +/- 80 mg/dl. Post meal BG's are rising less than 40 mg/dl so no change in Carb Ratio at this time. Plan increase in Basal Rate by 10% from 0.80 to 0.90 units per hour.   Pump Settings:changes made will be in bold print under date of visit Date: Current Date: 01/10/2013   Basal Rate: Carb Ratio Sensitivity  Basal Rate: Carb Ratio Sensitivity   MN: 0.800 10 32 MN: 0.900 10 32                                                         Plan: patient has tentative appointment to follow up with Melinda Crutch, [place]. He is to either confirm that appointment or decide to come back here for ongoing follow up. He needs to contact [place] to be able to set up [facility] on his computer so he can upload his pump on a regular basis for assessment of data.  Follow up: Pt to let me know if he will follow up in this office or through [employer] local clinical manager. CGM training will be done when he states he is ready.

## 2013-01-25 ENCOUNTER — Ambulatory Visit (INDEPENDENT_AMBULATORY_CARE_PROVIDER_SITE_OTHER): Payer: Self-pay | Admitting: Family Medicine

## 2013-01-25 DIAGNOSIS — E119 Type 2 diabetes mellitus without complications: Secondary | ICD-10-CM

## 2013-01-25 NOTE — Progress Notes (Signed)
Patient presents for 1 month follow up DM as part of the employee sponsored Link to [employer]. Medications, glucose readings, and insulin regimen have been reviewed. I have also discussed with patient lifestyle interventions such as diet and exercise. Full documentation of this visit can be found in the caretracker documenting system through [facility]). However, I did speak with patient specifically about compliance with blood pressure medication (he has not taken it in a week), as well as overcorrecting for low blood sugars. He was using 16 ounces of juice instead of 4 and then consequently shooting his blood sugar up to the 300-400 range. Reviewed rule of 15 for hypoglycemia. Patient has set a series of personal goals and will follow up in 1-2 months for further review of DM.

## 2013-01-29 ENCOUNTER — Ambulatory Visit: Payer: 59 | Admitting: *Deleted

## 2013-02-05 NOTE — Progress Notes (Signed)
Patient ID: [name], male   DOB: Jul 17, 1971, 42 y.o.   MRN: [id number] ATTENDING PHYSICIAN NOTE: I have reviewed the chart and agree with the plan as detailed above. Dorcas Mcmurray MD Pager [phone]

## 2013-04-11 ENCOUNTER — Encounter: Payer: 59 | Attending: Endocrinology | Admitting: *Deleted

## 2013-04-11 DIAGNOSIS — IMO0001 Reserved for inherently not codable concepts without codable children: Secondary | ICD-10-CM

## 2013-04-11 DIAGNOSIS — E1310 Other specified diabetes mellitus with ketoacidosis without coma: Secondary | ICD-10-CM | POA: Insufficient documentation

## 2013-04-11 DIAGNOSIS — Z713 Dietary counseling and surveillance: Secondary | ICD-10-CM | POA: Insufficient documentation

## 2013-04-11 DIAGNOSIS — E1110 Type 2 diabetes mellitus with ketoacidosis without coma: Secondary | ICD-10-CM

## 2013-04-11 NOTE — Progress Notes (Signed)
  Pump Follow Up Progress Note  Orders received from MD giving me permission to make insulin pump adjustments for the following patient. [name], [name] Male, 42 y.o., September 01, 1971  Reviewed blood glucose logs on 04/11/2013 via: [facility] and found the following:            Hypoglycemia Hyperglycemia Comments  Overnight Period:   YES   Pre-Meal:    Breakfast      Lunch      Supper     Post-Meal: Breakfast      Lunch      Supper     Bedtime:   YES    Comments: Patient states he met with his MD, Dr. Chalmers Cater 2 days ago and she increased his Carb Ratio and Sensitivty Factors already. So no plan to make any changes today until the results of these changes can be documented. Patient states that he is not comfortable with his carb counting accuracy and requests additional instruction. He also states he is putting in higher carb grams than he is actually eating in an effort to get more insulin and bring his BGs down. He has started jogging every evening for about 30 minutes both for weight loss and BG management. He expresses frustration of 16 pound stated weight gain in past 2 months since he started on the pump.  Pump Settings: Date: Current Date: no changes today   Basal Rate: Carb Ratio Sensitivity  Basal Rate: Carb Ratio Sensitivity   MN: 0.900 8.0 25 MN:     3 AM 0.925        11 A 0.950                                             Intervention: Answered questions regarding infusion set options and recommended he switch to Silhouette for less risk of site problems with his active job. Also reviewed Carb Counting and he expressed good verbal understanding of that. Commended him on his increase in jogging and SMBG frequency. Suggested he use Temp Basal to prevent hypoglycemia when he is jogging.  Plan:   Aim for 3 Carb Choices per meal (45 grams) +/- 1 either way to help with weight loss Aim for 0-2 Carbs per snack if hungry  Continue reading food labels for Total Carbohydrate and Fat  Grams of foods Continue with your activity level by jogging for 30 minutes daily as tolerated, good job! Consider using Temp Basal @ 75% for exercise and set duration for 1 hour longer than actual exercise time. Continue checking BG at alternate times per day as directed by MD  Continue using Bolus Wizard with your insulin pump for meal time and correction insulin. Please put in an accurate carb amount and if you need a smaller or greater insulin dose, override the total insulin recommendation accordingly rather than using incorrect carb amount. When ordering your pump supplies, consider switching from Mio to Silhouette infusion sets, (you will also need the Sil-serter). Remember to acknowledge the behaviors that you are doing well...   Follow up:  Patient follow up appointment in 4 weeks

## 2013-04-11 NOTE — Patient Instructions (Signed)
Plan:  Aim for 3 Carb Choices per meal (45 grams) +/- 1 either way to help with weight loss Aim for 0-2 Carbs per snack if hungry  Continue reading food labels for Total Carbohydrate and Fat Grams of foods Continue with your activity level by jogging for 30 minutes daily as tolerated, good job! Consider using Temp Basal @ 75% for exercise and set duration for 1 hour longer than actual exercise time. Continue checking BG at alternate times per day as directed by MD  Continue using Bolus Wizard with your insulin pump for meal time and correction insulin. Please put in an accurate carb amount and if you need a smaller or greater insulin dose, override the total insulin recommendation accordingly rather than using incorrect carb amount. When ordering your pump supplies, consider switching from Mio to Silhouette infusion sets, (you will also need the Sil-serter). Remember to acknowledge the behaviors that you are doing well.Marland KitchenMarland Kitchen

## 2013-05-15 ENCOUNTER — Encounter: Payer: 59 | Attending: Endocrinology | Admitting: *Deleted

## 2013-05-15 ENCOUNTER — Encounter: Payer: Self-pay | Admitting: *Deleted

## 2013-05-15 DIAGNOSIS — IMO0001 Reserved for inherently not codable concepts without codable children: Secondary | ICD-10-CM

## 2013-05-15 DIAGNOSIS — Z713 Dietary counseling and surveillance: Secondary | ICD-10-CM | POA: Insufficient documentation

## 2013-05-15 DIAGNOSIS — E1310 Other specified diabetes mellitus with ketoacidosis without coma: Secondary | ICD-10-CM | POA: Insufficient documentation

## 2013-05-15 NOTE — Progress Notes (Signed)
  Pump Follow Up Progress Note  Orders received from MD giving me permission to make insulin pump adjustments for the following patient.  Reviewed blood glucose logs on 05/15/2013 via: [facility] and found the following:            Hypoglycemia Hyperglycemia Comments  Overnight Period:   YES BASAL @ BEDTIME  Pre-Meal:    Breakfast      Lunch      Supper     Post-Meal: Breakfast      Lunch      Supper     Bedtime:   YES BASAL @ BEDTIME    Comments: Patient has had a stressful week with having to work all 3 shifts on various days. WE looked at past 2 weeks and evening BG are better but still too high. Suggest increasing evening basal rate by 10% again from 1.10 to 1.25 units/hr starting at 6 PM  Pump Settings: Date: Current Date: 05/15/2013   Basal Rate: Carb Ratio Sensitivity  Basal Rate: Carb Ratio Sensitivity   MN: 0.900 MN: 1/8 25 MN: 0.900 MN: 1/8 25   3  AM 0.925 5 PM: 1/7  3 AM 0.925 5 PM: 1/7   11 AM 0.850   11 AM 0.825    6 PM 1.10   6 PM 1.25 (+)                                Plan: Patient ready to start on CGM, appointment made for 05/31/2013 for Sensor Start   Follow up:  Patient to upload to [place] within 14 days for further review

## 2013-05-31 ENCOUNTER — Encounter: Payer: 59 | Admitting: *Deleted

## 2013-05-31 DIAGNOSIS — IMO0001 Reserved for inherently not codable concepts without codable children: Secondary | ICD-10-CM

## 2013-05-31 NOTE — Progress Notes (Signed)
CGM Training:  Appt start time: 1215 end time:1315.  Assessment:  Primary concerns today: Patient here for initiation of Medtronic Continuous Glucose Monitoring on 530G pump with Enlyte Sensor  Medications: see list, uses Humalog insulin in insulin pump     Intervention:   Understanding Glucose Sensing Programming Sensor Information  High Glucose: OFF  Low Glucose: MN: 80 mg/dl    7 AM: 60 mg/dl    10 PM: 80 mg/dl  Low Threshold Suspend: ON @ 60 mg/dl  Other settings to be added at follow up visit Starting [employer] of Product  Entering BG, Calibration Technique and Graphs with [name] and Reports - patient is already familiar with [place] Site and Alarms  Patient is more concerned about low BG during the night, so set Low Glucose Alarm lower during the day when he can feel the symptoms and a little higher during his sleep time.   Follow Up Patient to schedule visit with me for CGM follow up within 2 week(s).

## 2013-08-02 ENCOUNTER — Ambulatory Visit (INDEPENDENT_AMBULATORY_CARE_PROVIDER_SITE_OTHER): Payer: Self-pay | Admitting: Family Medicine

## 2013-08-02 VITALS — BP 145/103 | HR 92

## 2013-08-02 DIAGNOSIS — E119 Type 2 diabetes mellitus without complications: Secondary | ICD-10-CM

## 2013-08-02 NOTE — Progress Notes (Signed)
Patient presents for 3 mo f/u DM as part of the employee sponsored Link to [employer]. Medications, glucose readings, and insulin regimen have been reviewed. I have also discussed with patient lifestyle interventions such as diet and exercise. Full documentation of this visit can be found in the [employer] documenting system through [facility]). However specifics from this visit include the following:  Diabetes Mellitus: POC A1C 8.3, much better. Avg 14 day glucose 129. From his pump readings, I really don't see any adjustments that need to be made but Rise Paganini will look at them on Monday. Although the A1C is not at goal it is a 3 mo avg, and the last 2 weeks reflect very good control. I think given time and sustaining this level of control the A1C will come down.  plan 1.) will try to start exercising on the treadmill. His weight has increased a lot since he has started using the pump. 2.) f/u 3 mo but I will follow along with Rise Paganini to make sure he is following up and he stays under this level of control. He has not been wearing CGM so he will resume that soon because he woke up with a BG of 57 this am.  Hyperlipidemia: lipitor dose appropriate  Hypertension:  BP high today but patient went back to taking 1 tablet of the 20 mg even tho Dr. Chalmers Cater sent a new Rx for the 20 bid. He will f/u with her next week and tell her he hasn't been taking 40 mg. Hopefully she can just write for 40 mg tabs so he won't have to remember to take 2.  Cost Savings Intervention Outcomes: Medical problem identified  Patient has set a series of goals and will f/u in 3 mo for further review of DM

## 2013-08-06 ENCOUNTER — Encounter: Payer: 59 | Attending: Endocrinology | Admitting: *Deleted

## 2013-08-06 DIAGNOSIS — Z713 Dietary counseling and surveillance: Secondary | ICD-10-CM | POA: Insufficient documentation

## 2013-08-06 DIAGNOSIS — E1310 Other specified diabetes mellitus with ketoacidosis without coma: Secondary | ICD-10-CM | POA: Insufficient documentation

## 2013-08-06 DIAGNOSIS — IMO0001 Reserved for inherently not codable concepts without codable children: Secondary | ICD-10-CM

## 2013-08-06 NOTE — Progress Notes (Signed)
  Pump Follow Up Progress Note  Orders received from MD  giving me permission to make insulin pump adjustments for the following patient.  Reviewed blood glucose logs on 08/06/13 via: [place] that patient provided and found the following:            Hypoglycemia Hyperglycemia Comments  Overnight Period:      Pre-Meal:    Breakfast      Lunch      Supper     Post-Meal: Breakfast      Lunch      Supper     Bedtime:       Comments: Working 2nd shift right now.[facility] Personal reports indicate excellent BG control. He is now interested in weight loss. Per diet history, not enough carbohydrate intake. He walks at work all shift, not on his days off.   Pump Settings: no change today  Plan:  Aim for 3 Carb Choices per meal (45 grams) +/- 1 either way  Aim for 0-2 Carbs per snack if hungry  Consider reading food labels for Total Carbohydrate of foods Consider  increasing your activity level by aerobic exercise for 30 minutes 5 days a week or as tolerated Continue checking BG at alternate times per day as directed by MD  Consider wearing CGM 1 week a month or as needed.  Follow up: Patient to follow up with me every 3 months and more as needed.

## 2013-08-06 NOTE — Patient Instructions (Signed)
Plan:  Aim for 3 Carb Choices per meal (45 grams) +/- 1 either way  Aim for 0-2 Carbs per snack if hungry  Consider reading food labels for Total Carbohydrate of foods Consider  increasing your activity level by aerobic exercise for 30 minutes 5 days a week or as tolerated Continue checking BG at alternate times per day as directed by MD  Consider wearing CGM 1 week a month or as needed.

## 2013-08-09 ENCOUNTER — Other Ambulatory Visit: Payer: Self-pay

## 2013-09-28 ENCOUNTER — Encounter (HOSPITAL_COMMUNITY): Payer: Self-pay | Admitting: Emergency Medicine

## 2013-09-28 ENCOUNTER — Emergency Department (HOSPITAL_COMMUNITY): Payer: 59

## 2013-09-28 ENCOUNTER — Emergency Department (HOSPITAL_COMMUNITY)
Admission: EM | Admit: 2013-09-28 | Discharge: 2013-09-28 | Disposition: A | Discharge status: Home / Self Care | Payer: 59 | Attending: Emergency Medicine | Admitting: Emergency Medicine

## 2013-09-28 DIAGNOSIS — Z794 Long term (current) use of insulin: Secondary | ICD-10-CM | POA: Insufficient documentation

## 2013-09-28 DIAGNOSIS — Y9389 Activity, other specified: Secondary | ICD-10-CM | POA: Insufficient documentation

## 2013-09-28 DIAGNOSIS — S339XXA Sprain of unspecified parts of lumbar spine and pelvis, initial encounter: Secondary | ICD-10-CM | POA: Insufficient documentation

## 2013-09-28 DIAGNOSIS — S39012A Strain of muscle, fascia and tendon of lower back, initial encounter: Secondary | ICD-10-CM

## 2013-09-28 DIAGNOSIS — R110 Nausea: Secondary | ICD-10-CM | POA: Insufficient documentation

## 2013-09-28 DIAGNOSIS — E119 Type 2 diabetes mellitus without complications: Secondary | ICD-10-CM | POA: Insufficient documentation

## 2013-09-28 DIAGNOSIS — Z79899 Other long term (current) drug therapy: Secondary | ICD-10-CM | POA: Insufficient documentation

## 2013-09-28 DIAGNOSIS — E7800 Pure hypercholesterolemia, unspecified: Secondary | ICD-10-CM | POA: Insufficient documentation

## 2013-09-28 DIAGNOSIS — Z95818 Presence of other cardiac implants and grafts: Secondary | ICD-10-CM | POA: Insufficient documentation

## 2013-09-28 DIAGNOSIS — Y929 Unspecified place or not applicable: Secondary | ICD-10-CM | POA: Insufficient documentation

## 2013-09-28 DIAGNOSIS — Z8719 Personal history of other diseases of the digestive system: Secondary | ICD-10-CM | POA: Insufficient documentation

## 2013-09-28 DIAGNOSIS — X500XXA Overexertion from strenuous movement or load, initial encounter: Secondary | ICD-10-CM | POA: Insufficient documentation

## 2013-09-28 MED ORDER — DIAZEPAM 5 MG PO TABS
5.0000 mg | ORAL_TABLET | Freq: Once | ORAL | Status: AC
  Administered 2013-09-28: 5 mg via ORAL
  Filled 2013-09-28: qty 1

## 2013-09-28 MED ORDER — DIAZEPAM 5 MG PO TABS: 5.0000 mg | ORAL_TABLET | Freq: Two times a day (BID) | ORAL | Status: DC | PRN

## 2013-09-28 MED ORDER — OXYCODONE-ACETAMINOPHEN 5-325 MG PO TABS: 1.0000 | ORAL_TABLET | ORAL | Status: DC | PRN

## 2013-09-28 MED ORDER — HYDROMORPHONE HCL PF 1 MG/ML IJ SOLN
1.0000 mg | Freq: Once | INTRAMUSCULAR | Status: AC
  Administered 2013-09-28: 1 mg via INTRAMUSCULAR
  Filled 2013-09-28: qty 1

## 2013-09-28 MED ORDER — ONDANSETRON 8 MG PO TBDP
8.0000 mg | ORAL_TABLET | Freq: Once | ORAL | Status: AC
  Administered 2013-09-28: 8 mg via ORAL
  Filled 2013-09-28: qty 1

## 2013-09-28 MED ORDER — ONDANSETRON 4 MG PO TBDP: 4.0000 mg | ORAL_TABLET | Freq: Three times a day (TID) | ORAL | Status: DC | PRN

## 2013-09-28 NOTE — ED Notes (Signed)
Pt in stating a few days ago while getting ready for work he lifted up and felt instant pain in his lower back and to the right side of his lower back, states pain has continued and worsened over the last few days, also c/o nausea with this, pain is constant despite movement

## 2013-09-28 NOTE — ED Notes (Signed)
PA notified of pt's unchanged response to pain.

## 2013-09-28 NOTE — ED Provider Notes (Signed)
CSN: [id number]     Arrival date & time 09/28/13  1247 History   First MD Initiated Contact with Patient 09/28/13 1538     Chief Complaint  Patient presents with  . Back Pain  . Nausea   (Consider location/radiation/quality/duration/timing/severity/associated sxs/prior Treatment) HPI Comments: Patient is a 42 year old male past medical history significant for DM, gastroparesis, hypercholesterolemia presented to the emergency department for right-sided low back pain that began on Tuesday. He states he stood up and felt his right low back become tight. He states his pain has been getting worse since then. He denies any alleviating factors. He says his pain is worsened with sitting to standing. He denies any radiation down his legs. He denies any fevers, numbness or weakness, bladder or bowel incontinence, history of IV drug use, history of cancer.   Patient is a 42 y.o. male presenting with back pain.  Back Pain Associated symptoms: no fever, no numbness and no weakness     Past Medical History  Diagnosis Date  . Diabetes mellitus   . Palpitations   . Tachycardia   . Gastroparesis   . Hypercholesteremia   . S/P cardiac cath 18 yrs ago    performed in the remote past; reportedly normal   Past Surgical History  Procedure Laterality Date  . Hernia repair      bilateral  . Cervical neck fusion     Family History  Problem Relation Age of Onset  . Hypertension    . Coronary artery disease    . Diabetes     History  Substance Use Topics  . Smoking status: Never Smoker   . Smokeless tobacco: Never Used  . Alcohol Use: No    Review of Systems  Constitutional: Negative for fever and chills.  Musculoskeletal: Positive for back pain.  Neurological: Negative for weakness and numbness.  All other systems reviewed and are negative.    Allergies  Oxycontin; Prednisone; and Shellfish-derived products  Home Medications   Current Outpatient Rx  Name  Route  Sig  Dispense   Refill  . atorvastatin (LIPITOR) 40 MG tablet   Oral   Take 40 mg by mouth daily.         . cyclobenzaprine (FLEXERIL) 5 MG tablet   Oral   Take 10 mg by mouth 3 (three) times daily as needed for muscle spasms.         Marland Kitchen HYDROcodone-acetaminophen (NORCO) 5-325 MG per tablet   Oral   Take 1 tablet by mouth every 6 (six) hours as needed for moderate pain (pain).         Marland Kitchen ibuprofen (ADVIL,MOTRIN) 200 MG tablet   Oral   Take 600 mg by mouth every 6 (six) hours as needed (pain).         . insulin lispro (HUMALOG) 100 UNIT/ML injection   Subcutaneous   Inject into the skin 3 (three) times daily before meals. For use via insulin pump         . lisinopril (PRINIVIL,ZESTRIL) 20 MG tablet   Oral   Take 20 mg by mouth daily.         . Testosterone (ANDROGEL) 20.25 MG/1.25GM (1.62%) GEL   Transdermal   Place 4 application onto the skin daily.          . diazepam (VALIUM) 5 MG tablet   Oral   Take 1 tablet (5 mg total) by mouth every 12 (twelve) hours as needed for muscle spasms.   12 tablet  0   . ondansetron (ZOFRAN ODT) 4 MG disintegrating tablet   Oral   Take 1 tablet (4 mg total) by mouth every 8 (eight) hours as needed for nausea or vomiting.   10 tablet   0   . oxyCODONE-acetaminophen (PERCOCET) 5-325 MG per tablet   Oral   Take 1 tablet by mouth every 4 (four) hours as needed.   20 tablet   0    BP 174/91  Pulse 96  Temp(Src) 98.4 F (36.9 C) (Oral)  Resp 18  Wt 240 lb (108.863 kg)  SpO2 100% Physical Exam  Constitutional: He is oriented to person, place, and time. He appears well-developed and well-nourished. No distress.  HENT:  Head: Normocephalic and atraumatic.  Right Ear: External ear normal.  Left Ear: External ear normal.  Nose: Nose normal.  Mouth/Throat: Oropharynx is clear and moist. No oropharyngeal exudate.  Eyes: Conjunctivae and EOM are normal. Pupils are equal, round, and reactive to light.  Neck: Normal range of motion. Neck  supple.  Cardiovascular: Normal rate, regular rhythm, normal heart sounds and intact distal pulses.   Pulmonary/Chest: Effort normal and breath sounds normal. No respiratory distress.  Abdominal: Soft. There is no tenderness.  Musculoskeletal: Normal range of motion.       Lumbar back: He exhibits tenderness. He exhibits normal range of motion, no bony tenderness and no swelling.       Back:  Neurological: He is alert and oriented to person, place, and time. He has normal strength. No cranial nerve deficit or sensory deficit. Gait normal. GCS eye subscore is 4. GCS verbal subscore is 5. GCS motor subscore is 6.  No pronator drift. Bilateral heel-knee-shin intact.  Skin: Skin is warm and dry. He is not diaphoretic.    ED Course  Procedures (including critical care time) Medications  ondansetron (ZOFRAN-ODT) disintegrating tablet 8 mg (8 mg Oral Given 09/28/13 1554)  diazepam (VALIUM) tablet 5 mg (5 mg Oral Given 09/28/13 1646)  HYDROmorphone (DILAUDID) injection 1 mg (1 mg Intramuscular Given 09/28/13 1800)    Labs Review Labs Reviewed - No data to display Imaging Review Dg Lumbar Spine Complete  09/28/2013   CLINICAL DATA:  Acute onset low back pain.  EXAM: LUMBAR SPINE - COMPLETE 4+ VIEW  COMPARISON:  CT abdomen and pelvis 12/24/2010.  FINDINGS: Vertebral body height and alignment are maintained. As seen on CT scan, there is some loss of disc space height and vacuum disc phenomenon at L3-4. Facet degenerative change is noted at L5-S1. Paraspinous structures are unremarkable.  IMPRESSION: No acute finding.  No marked change in the appearance of L3-4 degenerative disc disease.   Electronically Signed   By: Inge Rise M.D.   On: 09/28/2013 16:36    EKG Interpretation   None       MDM   1. Lumbosacral strain, initial encounter     Afebrile, NAD, non-toxic appearing, AAOx4. Patient with back pain.  No neurological deficits and normal neuro exam.  Patient can walk but states  is painful.  No loss of bowel or bladder control.  No concern for cauda equina.  No fever, night sweats, weight loss, h/o cancer, IVDU.  RICE protocol and pain medicine indicated and discussed with patient. Return precautions discussed. Patient is agreeable to plan. Patient is stable at time of discharge.         Harlow Mares, PA-C 09/28/13 2201

## 2013-09-29 NOTE — ED Provider Notes (Signed)
Medical screening examination/treatment/procedure(s) were performed by non-physician practitioner and as supervising physician I was immediately available for consultation/collaboration.  EKG Interpretation   None         Neta Ehlers, MD 09/29/13 0111

## 2013-10-15 NOTE — Progress Notes (Signed)
Patient ID: [name], male   DOB: 1971/03/28, 43 y.o.   MRN: [id number] ATTENDING PHYSICIAN NOTE: I have reviewed the chart and agree with the plan as detailed above. Dorcas Mcmurray MD Pager [phone]

## 2013-11-06 ENCOUNTER — Ambulatory Visit: Payer: 59 | Admitting: *Deleted

## 2013-11-27 ENCOUNTER — Ambulatory Visit (INDEPENDENT_AMBULATORY_CARE_PROVIDER_SITE_OTHER): Payer: Self-pay | Admitting: Family Medicine

## 2013-11-27 VITALS — BP 160/100 | HR 98

## 2013-11-27 DIAGNOSIS — E119 Type 2 diabetes mellitus without complications: Secondary | ICD-10-CM

## 2013-11-27 NOTE — Progress Notes (Signed)
Patient presents for 3 mo f/u DM as part of the employee sponsored Link to [employer]. Medications, glucose readings, and insulin regimen have been reviewed. I have also discussed with patient lifestyle interventions such as diet and exercise. Full documentation of this visit can be found in the [employer] documenting system through [facility]). However, specifics from this visit include the following:  Diabetes Mellitus:POC A1C 9.9, not at goal. Patient was having some personal issues with his wife for over a month and they have since reconciled. He said he let everything go during that time. His 2 week print out looks great. There was only 1 hyperglycemic number the past 2 weeks plan 1.) I don't see any pump adjustments needing to be made at this point. When he stays focused and doing well, his numbers are in range. The recurring problem with him is that he gets lax at certain points and that's when he has the issue. It's difficult to get him in to his appointments on a 3 mo schedule. The longer I go without seeing him, the more he lapses back into his old habits. 2.) Will monitor patient more closely. I have asked he fax me his printouts from his pump every 2 weeks to make sure he is staying on track.  Hyperlipidemia: on appropriately dosed statin  Hypertension: BP elevated x 2 checks. Confirmed he is taking his BP medicine correctly. Last time he hadn't increased his lisinopril dose. Now he is taking it correctly but BP is still high. Pt will check at home x 1 week. I will call him next week to see what it has been running. If still high, then he will need to make an appointment with his primary to add another medicine. HCTZ or amlodipine would be fine.  Cost Savings Intervention Outcomes: Medical problem identified    Patient has set a series of personal goals and will f/u in 3 mo for further review of DM.

## 2013-12-17 ENCOUNTER — Observation Stay (HOSPITAL_COMMUNITY)
Admission: EM | Admit: 2013-12-17 | Discharge: 2013-12-18 | Disposition: A | Discharge status: Home / Self Care | Payer: 59 | Attending: Cardiovascular Disease | Admitting: Cardiovascular Disease

## 2013-12-17 ENCOUNTER — Emergency Department (HOSPITAL_COMMUNITY): Payer: 59

## 2013-12-17 ENCOUNTER — Other Ambulatory Visit: Payer: Self-pay

## 2013-12-17 ENCOUNTER — Encounter (HOSPITAL_COMMUNITY): Payer: Self-pay | Admitting: Emergency Medicine

## 2013-12-17 DIAGNOSIS — I10 Essential (primary) hypertension: Secondary | ICD-10-CM | POA: Diagnosis present

## 2013-12-17 DIAGNOSIS — E162 Hypoglycemia, unspecified: Secondary | ICD-10-CM

## 2013-12-17 DIAGNOSIS — E7800 Pure hypercholesterolemia, unspecified: Secondary | ICD-10-CM | POA: Diagnosis present

## 2013-12-17 DIAGNOSIS — E119 Type 2 diabetes mellitus without complications: Secondary | ICD-10-CM

## 2013-12-17 DIAGNOSIS — Z888 Allergy status to other drugs, medicaments and biological substances status: Secondary | ICD-10-CM | POA: Insufficient documentation

## 2013-12-17 DIAGNOSIS — K3184 Gastroparesis: Secondary | ICD-10-CM | POA: Insufficient documentation

## 2013-12-17 DIAGNOSIS — R079 Chest pain, unspecified: Secondary | ICD-10-CM | POA: Diagnosis present

## 2013-12-17 DIAGNOSIS — Z794 Long term (current) use of insulin: Secondary | ICD-10-CM | POA: Insufficient documentation

## 2013-12-17 DIAGNOSIS — I498 Other specified cardiac arrhythmias: Secondary | ICD-10-CM | POA: Insufficient documentation

## 2013-12-17 DIAGNOSIS — E109 Type 1 diabetes mellitus without complications: Secondary | ICD-10-CM

## 2013-12-17 DIAGNOSIS — Z9889 Other specified postprocedural states: Secondary | ICD-10-CM | POA: Insufficient documentation

## 2013-12-17 DIAGNOSIS — E1069 Type 1 diabetes mellitus with other specified complication: Secondary | ICD-10-CM | POA: Insufficient documentation

## 2013-12-17 DIAGNOSIS — R0789 Other chest pain: Principal | ICD-10-CM | POA: Insufficient documentation

## 2013-12-17 DIAGNOSIS — Z79899 Other long term (current) drug therapy: Secondary | ICD-10-CM | POA: Insufficient documentation

## 2013-12-17 HISTORY — DX: Essential (primary) hypertension: I10

## 2013-12-17 HISTORY — DX: Tachycardia, unspecified: R00.0

## 2013-12-17 HISTORY — DX: Type 1 diabetes mellitus without complications: E10.9

## 2013-12-17 LAB — HEPATIC FUNCTION PANEL
ALT: 14 U/L (ref 0–53)
AST: 17 U/L (ref 0–37)
Albumin: 3.3 g/dL — ABNORMAL LOW (ref 3.5–5.2)
Alkaline Phosphatase: 65 U/L (ref 39–117)
Bilirubin, Direct: <0.2 mg/dL (ref 0.0–0.3)
Total Bilirubin: 0.4 mg/dL (ref 0.3–1.2)
Total Protein: 6.8 g/dL (ref 6.0–8.3)

## 2013-12-17 LAB — BASIC METABOLIC PANEL
BUN: 18 mg/dL (ref 6–23)
CO2: 26 mEq/L (ref 19–32)
Calcium: 9.9 mg/dL (ref 8.4–10.5)
Chloride: 96 mEq/L (ref 96–112)
Creatinine, Ser: 1.12 mg/dL (ref 0.50–1.35)
GFR calc Af Amer: >90 mL/min (ref >90)
GFR calc non Af Amer: 80 mL/min — ABNORMAL LOW (ref >90)
Glucose, Bld: 107 mg/dL — ABNORMAL HIGH (ref 70–99)
Potassium: 4.7 mEq/L (ref 3.7–5.3)
Sodium: 136 mEq/L — ABNORMAL LOW (ref 137–147)

## 2013-12-17 LAB — GLUCOSE, CAPILLARY: Glucose-Capillary: 306 mg/dL — ABNORMAL HIGH (ref 70–99)

## 2013-12-17 LAB — CBG MONITORING, ED
Glucose-Capillary: 46 mg/dL — ABNORMAL LOW (ref 70–99)
Glucose-Capillary: 108 mg/dL — ABNORMAL HIGH (ref 70–99)

## 2013-12-17 LAB — CBC
HCT: 40.4 % (ref 39.0–52.0)
Hemoglobin: 14.1 g/dL (ref 13.0–17.0)
MCH: 29.1 pg (ref 26.0–34.0)
MCHC: 34.9 g/dL (ref 30.0–36.0)
MCV: 83.5 fL (ref 78.0–100.0)
Platelets: 271 10*3/uL (ref 150–400)
RBC: 4.84 MIL/uL (ref 4.22–5.81)
RDW: 13 % (ref 11.5–15.5)
WBC: 7.6 10*3/uL (ref 4.0–10.5)

## 2013-12-17 LAB — I-STAT TROPONIN, ED: Troponin i, poc: 0 ng/mL (ref 0.00–0.08)

## 2013-12-17 LAB — TROPONIN I: Troponin I: <0.3 ng/mL (ref <0.30)

## 2013-12-17 LAB — PROTIME-INR
INR: 1.01 (ref 0.00–1.49)
Prothrombin Time: 13.1 seconds (ref 11.6–15.2)

## 2013-12-17 MED ORDER — SODIUM CHLORIDE 0.9 % IV SOLN: 250.0000 mL | INTRAVENOUS | Status: DC | PRN

## 2013-12-17 MED ORDER — ASPIRIN 81 MG PO CHEW
81.0000 mg | CHEWABLE_TABLET | ORAL | Status: AC
  Administered 2013-12-18: 81 mg via ORAL
  Filled 2013-12-17: qty 1

## 2013-12-17 MED ORDER — SODIUM CHLORIDE 0.9 % IJ SOLN: 3.0000 mL | Freq: Two times a day (BID) | INTRAMUSCULAR | Status: DC

## 2013-12-17 MED ORDER — HEPARIN SODIUM (PORCINE) 5000 UNIT/ML IJ SOLN
5000.0000 [IU] | Freq: Three times a day (TID) | INTRAMUSCULAR | Status: DC
  Administered 2013-12-17: 5000 [IU] via SUBCUTANEOUS
  Filled 2013-12-17 (×5): qty 1

## 2013-12-17 MED ORDER — LISINOPRIL 20 MG PO TABS
20.0000 mg | ORAL_TABLET | Freq: Two times a day (BID) | ORAL | Status: DC
  Administered 2013-12-17 – 2013-12-18 (×2): 20 mg via ORAL
  Filled 2013-12-17 (×3): qty 1

## 2013-12-17 MED ORDER — ATORVASTATIN CALCIUM 40 MG PO TABS
40.0000 mg | ORAL_TABLET | Freq: Every day | ORAL | Status: DC
  Administered 2013-12-18: 40 mg via ORAL
  Filled 2013-12-17: qty 1

## 2013-12-17 MED ORDER — ASPIRIN EC 81 MG PO TBEC
81.0000 mg | DELAYED_RELEASE_TABLET | Freq: Every day | ORAL | Status: DC
  Filled 2013-12-17: qty 1

## 2013-12-17 MED ORDER — INSULIN PUMP
Freq: Three times a day (TID) | SUBCUTANEOUS | Status: DC
  Administered 2013-12-17: 1.4 via SUBCUTANEOUS
  Administered 2013-12-18: 4.5 via SUBCUTANEOUS
  Administered 2013-12-18: 6.3 via SUBCUTANEOUS
  Filled 2013-12-17: qty 1

## 2013-12-17 MED ORDER — NITROGLYCERIN 0.4 MG SL SUBL: 0.4000 mg | SUBLINGUAL_TABLET | SUBLINGUAL | Status: DC | PRN

## 2013-12-17 MED ORDER — SODIUM CHLORIDE 0.9 % IJ SOLN
3.0000 mL | INTRAMUSCULAR | Status: DC | PRN
Start: 2013-12-17 — End: 2013-12-18

## 2013-12-17 MED ORDER — ASPIRIN 81 MG PO CHEW
324.0000 mg | CHEWABLE_TABLET | Freq: Once | ORAL | Status: AC
  Administered 2013-12-17: 324 mg via ORAL
  Filled 2013-12-17: qty 4

## 2013-12-17 MED ORDER — DEXTROSE 50 % IV SOLN
1.0000 | Freq: Once | INTRAVENOUS | Status: DC
Start: 2013-12-17 — End: 2013-12-18

## 2013-12-17 MED ORDER — SODIUM CHLORIDE 0.9 % IV SOLN
1.0000 mL/kg/h | INTRAVENOUS | Status: DC
  Administered 2013-12-18: 1 mL/kg/h via INTRAVENOUS

## 2013-12-17 NOTE — ED Provider Notes (Signed)
CSN: [id number]     Arrival date & time 12/17/13  1349 History   First MD Initiated Contact with Patient 12/17/13 1429     Chief Complaint  Patient presents with  . Chest Pain     (Consider location/radiation/quality/duration/timing/severity/associated sxs/prior Treatment) HPI Pt presents with c/o chest pain intermittently over the past 3 days. He states symptoms are a tightness in the chest, described as a squeezing sensation.  No diaphoresis ( he was diaphoretic upon arrival to the ED due to low blood sugar), associated with nausea.  Symptoms come on at rest, last approx 30 seconds, then relieved on its own.  Has hx of HTN, DM, early heart disease in family.  No sob.  Currently has no chest pain.  There are no other associated systemic symptoms, there are no other alleviating or modifying factors.   Past Medical History  Diagnosis Date  . Type 1 diabetes mellitus     a. With insulin pump.  . Gastroparesis   . Hypercholesteremia   . S/P cardiac cath     a. 10-15 yrs ago at [place] due to tachycardia, reportedly normal. b. Normal ETT 03/2012.  Marland Kitchen Sinus tachycardia     a. 24-hr Holter 03/2012 - SR, occ PVCs, no VT, avg HR 96bpm.  . HTN (hypertension)    Past Surgical History  Procedure Laterality Date  . Hernia repair      bilateral  . Cervical neck fusion     Family History  Problem Relation Age of Onset  . Hypertension    . Coronary artery disease      Mother's side - both her side's grandparents died of heart disease (MIs)  . Diabetes    . Stroke      Paternal grandfather (89)   History  Substance Use Topics  . Smoking status: Never Smoker   . Smokeless tobacco: Never Used  . Alcohol Use: Yes     Comment: Rare - 1 beer a month    Review of Systems ROS reviewed and all otherwise negative except for mentioned in HPI    Allergies  Oxycontin; Prednisone; and Shellfish-derived products  Home Medications   Current Outpatient Rx  Name  Route  Sig  Dispense  Refill  .  atorvastatin (LIPITOR) 40 MG tablet   Oral   Take 40 mg by mouth daily.         Marland Kitchen ibuprofen (ADVIL,MOTRIN) 200 MG tablet   Oral   Take 200 mg by mouth every 6 (six) hours as needed.         . insulin lispro (HUMALOG) 100 UNIT/ML injection   Subcutaneous   Inject into the skin 3 (three) times daily before meals. For use via insulin pump         . lisinopril (PRINIVIL,ZESTRIL) 20 MG tablet   Oral   Take 20 mg by mouth 2 (two) times daily.          . sildenafil (VIAGRA) 100 MG tablet   Oral   Take 100 mg by mouth daily as needed for erectile dysfunction.          BP 140/83  Pulse 89  Temp(Src) 97.7 F (36.5 C) (Oral)  Resp 24  Wt 240 lb (108.863 kg)  SpO2 98% Vitals reviewed Physical Exam Physical Examination: General appearance - alert, well appearing, and in no distress Mental status - alert, oriented to person, place, and time Eyes - no conjunctival injection, no scleral icterus Mouth - mucous membranes moist, pharynx  normal without lesions Chest - clear to auscultation, no wheezes, rales or rhonchi, symmetric air entry Heart - normal rate, regular rhythm, normal S1, S2, no murmurs, rubs, clicks or gallops Abdomen - soft, nontender, nondistended, no masses or organomegaly Extremities - peripheral pulses normal, no pedal edema, no clubbing or cyanosis Skin - normal coloration and turgor, no rashes  ED Course  Procedures (including critical care time) Labs Review Labs Reviewed  BASIC METABOLIC PANEL - Abnormal; Notable for the following:    Sodium 136 (*)    Glucose, Bld 107 (*)    GFR calc non Af Amer 80 (*)    All other components within normal limits  CBG MONITORING, ED - Abnormal; Notable for the following:    Glucose-Capillary 46 (*)    All other components within normal limits  CBG MONITORING, ED - Abnormal; Notable for the following:    Glucose-Capillary 108 (*)    All other components within normal limits  CBC  I-STAT TROPOININ, ED   Imaging  Review Dg Chest 2 View  12/17/2013   CLINICAL DATA:  Shortness of Breath, left side chest pain  EXAM: CHEST  2 VIEW  COMPARISON:  11/25/2012  FINDINGS: Cardiomediastinal silhouette is stable. Mild elevation of the left hemidiaphragm again noted. No acute infiltrate or pleural effusion. No pulmonary edema. Bony thorax is unremarkable.  IMPRESSION: No active cardiopulmonary disease.   Electronically Signed   By: Lahoma Crocker M.D.   On: 12/17/2013 15:33     EKG Interpretation   Date/Time:  Monday December 17 2013 13:56:50 EDT Ventricular Rate:  96 PR Interval:  156 QRS Duration: 88 QT Interval:  352 QTC Calculation: 444 R Axis:   25 Text Interpretation:  Normal sinus rhythm Septal infarct , age  undetermined Abnormal ECG No significant change since last tracing  Confirmed by [facility]  MD, Aayden Cefalu [phone]) on 12/17/2013 4:25:28 PM      MDM   Final diagnoses:  Chest pain  Hypoglycemia    Pt with multiple risk factors and chest pain described as tightness with diaphoresis.  EKG reassuring, intiial troponin negative.  Pt was hypolgycemic on arrival- states this was due to missing his usual lunch time because of coming to the ED.  This was resolved with eating and drinking.  Due to his risk factors and somewhat concerning story of chest pain, cardiology consulted.  Pt has seen Dr. Frances Nickels in the past.  Plan per cardiology recs.      Threasa Beards, MD 12/17/13 1700

## 2013-12-17 NOTE — ED Notes (Addendum)
Pt provided 2 cups of orange juice and pack of graham crackers.

## 2013-12-17 NOTE — H&P (Signed)
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 He reportedly underwent cath over 10 yrs ago in [place] that was normal. He saw Dr. Johnsie Cancel in 2013 for atypical CP/dyspnea with normal ETT & normal echo (except mild-mod LVH), as well as palpitations/sinus tach with 24-hr Holter showing NSR with occ PVC, no VT, avg HR 96. He is good friends with Truman Hayward in the [employer]. He has not had any recent palpitations. However, since Wed of last week (3/11) he has had intermittent chest discomfort that lasts anywhere from 15 seconds up to maximum 5 minutes. It is sometimes sharp and sometimes squeezing. There are no inciting factors including exertion, palpation, or inspiration. When it first started it was very rare and he thought it might just be heartburn. However, over the last 3-4 days it has occurred with increasing frequency. Today he's had it just about every hour. It is sometimes associated with R arm discomfort, finger tingling, and R leg discomfort. He has chronic underlying unchanged dyspnea but does feel more tired recently. He occasional feels nausea, SOB with the chest discomfort but not every time. Today he had an episode brought on by standing up for the CXR. He also had an episode while I was in the room lasting about 15 seconds not associated with any telemetry abnormalities. He denies any leg erythema, edema, recent travel/surgery/bedrest, syncope, near syncope, fevers, weight changes. He does not exercise. His job is not very physical but he denies any recent exertional sx - the CP seems to  occur at random. He has noticed his BP running higher lately. He does admit he may not have been very compliant with his Lisinopril and Lipitor in the past but for the past few weeks has been really committed to taking it.  In the ER, labs significant for negative troponin x 1, normal CBC, glu 106 dropping to 46 in the ER but this was due to insulin pump in the setting of missed meals. He denies any other recent hypoglycemic events. He is not hypoxic or tachycardic in the ER.  Past Medical History  Diagnosis Date  . Diabetes mellitus     a. With insulin pump.  . Gastroparesis   . Hypercholesteremia   . S/P cardiac cath     a. ~15 yrs ago at [facility], reportedly normal. b. Normal ETT 03/2012.   Marland Kitchen Sinus tachycardia     a. 24-hr Holter 03/2012 - SR, occ PVCs, no VT, avg HR 96bpm.  . HTN (hypertension)      Most Recent Cardiac Studies: 2D Echo 03/2012 - Left ventricle: The cavity size was normal. Wall thickness was increased increased in a pattern of mild to moderate LVH. The estimated ejection fraction was 60%. Wall motion was normal; there were no regional wall motion abnormalities. Left ventricular diastolic function parameters were normal. - Right ventricle: The cavity size was normal. Systolic function was normal.  24-hr Holter 03/2012 to evaluate tachycardia SR occasional PVCs, no VT, avg HR 96bpm  ETT 03/2012 ETT Interpretation: normal - no evidence of ischemia by ST analysis  Comments:  Patient presents today for GXT due to resting tachycardia and atypical chest pain. Has HTN and is diabetic. Has had a normal EF per echo but has LVH noted. He exercised today on the standard Bruce protocol for a total of 9 minutes. Good exercise tolerance. Adequate blood pressure reponse. Clinically negative for chest pain. EKG negative. Target reached at 8:36. Heart rate dropped 12 beats from his maximum HR within 45 seconds of recovery.  Recommendations:  Per Dr. Kyla Balzarine recommendation, will place 24 Holter  to further evaluate his tachycardia. I suspect he may have beta blocker added to his regimen. Will tentatively see him back in about 1 month if so. Risk factor modification is strongly encouraged.     Surgical History:  Past Surgical History  Procedure Laterality Date  . Hernia repair      bilateral  . Cervical neck fusion       Home Meds: Prior to Admission medications   Medication Sig Start Date End Date Taking? Authorizing Provider  atorvastatin (LIPITOR) 40 MG tablet Take 40 mg by mouth daily.   Yes Historical Provider, MD  ibuprofen (ADVIL,MOTRIN) 200 MG tablet Take 200 mg by mouth every 6 (six) hours as needed.   Yes Historical Provider, MD  insulin lispro (HUMALOG) 100 UNIT/ML injection Inject into the skin 3 (three) times daily before meals. For use via insulin pump   Yes Historical Provider, MD  lisinopril (PRINIVIL,ZESTRIL) 20 MG tablet Take 20 mg by mouth 2 (two) times daily.    Yes Historical Provider, MD  sildenafil (VIAGRA) 100 MG tablet Take 100 mg by mouth daily as needed for erectile dysfunction.   Yes Historical Provider, MD    Allergies:  Allergies  Allergen Reactions  . Oxycontin [Oxycodone Hcl] Nausea And Vomiting  . Prednisone Other (See Comments)    Patient is diabetic, runs sugar up  . Shellfish-Derived Products     Pt is not allergic to iodine, has had iodine in the past w/o premeds and w/ no problems    History   Social History  . Marital Status: Married    Spouse Name: N/A    Number of Children: N/A  . Years of Education: N/A   Occupational History  . Not on file.   Social History Main Topics  . Smoking status: Never Smoker   . Smokeless tobacco: Never Used  . Alcohol Use: No  . Drug Use: No  . Sexual Activity: Yes   Other Topics Concern  . Not on file   Social History Narrative  . No narrative on file     Family History  Problem Relation Age of Onset  . Hypertension    . Coronary artery disease    . Diabetes      Review of  Systems: General: negative for chills, fever, night sweats or weight changes.  Cardiovascular: negative for orthopnea Dermatological: negative for rash Respiratory: negative for cough or wheezing Urologic: negative for hematuria Abdominal: negative for vomiting, diarrhea, bright red blood per rectum, melena, or hematemesis Neurologic: negative for visual changes, syncope, or dizziness All other systems reviewed and are otherwise negative except as noted above.  Labs:   Lab Results  Component Value Date   WBC 7.6 12/17/2013   HGB 14.1 12/17/2013   HCT 40.4 12/17/2013   MCV 83.5 12/17/2013   PLT 271 12/17/2013    Recent Labs Lab 12/17/13 1400  NA 136*  K 4.7  CL 96  CO2 26  BUN 18  CREATININE 1.12  CALCIUM 9.9  GLUCOSE 107*   Troponin neg x 1  Radiology/Studies:  Dg Chest 2 View 12/17/2013   CLINICAL DATA:  Shortness of Breath, left side chest pain  EXAM: CHEST  2 VIEW  COMPARISON:  11/25/2012  FINDINGS: Cardiomediastinal silhouette is stable. Mild elevation of the left hemidiaphragm again noted. No acute infiltrate or pleural effusion. No pulmonary edema. Bony thorax is unremarkable.  IMPRESSION: No active cardiopulmonary disease.   Electronically Signed   By: Lahoma Crocker M.D.   On: 12/17/2013 15:33    EKG: NSR 96bpm septal infarct age undetermined, TWI avL, otherwise no acute changes, not sig changed from prior  Physical Exam: Blood pressure 120/80, pulse 87, temperature 97.7 F (36.5 C), temperature source Oral, resp. rate 12, weight 240 lb (108.863 kg), SpO2 100.00%. General: Well developed, well nourished overweight WM in no acute distress. Head: Normocephalic, atraumatic, sclera non-icteric, no xanthomas, nares are without discharge.  Neck: JVD not elevated. Lungs: Clear bilaterally to auscultation without wheezes, rales, or rhonchi. Breathing is unlabored. Heart: RRR with S1 S2. No murmurs, rubs, or gallops appreciated. Abdomen: Soft, non-tender, non-distended with  normoactive bowel sounds. No hepatomegaly. No rebound/guarding. No obvious abdominal masses. Msk:  Strength and tone appear normal for age. Extremities: No clubbing or cyanosis. No edema or erythema. No signs to indicate DVT.  Distal pedal pulses are 2+ and equal bilaterally. Neuro: Alert and oriented X 3. No focal deficit. No facial asymmetry. Moves all extremities spontaneously. Psych:  Responds to questions appropriately with a normal affect.    ASSESSMENT AND PLAN:  1. Chest pain, somewhat atypical but in setting of 18+ yrs diabetes 2. Diabetes mellitus, on insulin pump 3. HTN, controlled here but patient reports running higher recently 4. Hyperlipidemia, on statin  Symptoms atypical but persistent and worsening with some features concerning for angina. He is a longtime diabetic so presentation could be skewed. Will admit, continue aspirin, cycle troponins, continue home meds. Follow BP. Will ask for diabetes coordinator help with insulin pump. For most definitive evaluation given high pretest probability, will plan cardiac cath tomorrow. Risks/benefits discussed with patient who is in agreement.  Patient will need to cover insulin pump with lead per cath lab. Dr. Johnsie Cancel says the case should be done radially if possible.  Signed, Melina Copa PA-C 12/17/2013, 4:26 PM  Patient examined chart reviewed including my notes and ETT from 2013  Progressive chest pain in patient with IDDM for over 18 years.  Some of the features are atypical but frequency increasing.  Favor diagnostic cath in am.  Risks discussed including MI and stroke Willing to proceed. Hopefully can be done radially.  Last cath at [facility] lots of pain in groin and had to use LFA ? Not clear why Has shell fish allergy not iodine.  No need for prednisone and it makes his BS go very high.  Normal breakfast then NPO Ask DM coordinator to help with insulin  Pump  Check BS before cath and during case.    Jenkins Rouge

## 2013-12-17 NOTE — ED Notes (Signed)
Cardiology at bedside.

## 2013-12-17 NOTE — ED Notes (Signed)
Attempted to call report x 1  

## 2013-12-17 NOTE — ED Notes (Signed)
Pt reports left sided CP x 3 days and states today it started radiating to his left arm. States the pain comes and goes, but he has it appx every 10 minutes lasting appx 15-30 seconds. Pt reports SOB with pain, and increased generalized weakness x 3 days. PT is AO x4. States "I feel a lot better now that my blood sugar is up." Denies eating lunch today, on insulin pump.

## 2013-12-17 NOTE — ED Notes (Signed)
PT returned from xray. Md Linker at bedside.

## 2013-12-17 NOTE — ED Notes (Signed)
Pt reports cp on and off for 4 days. Currently states tightness to left side of chest. Pt is diaphoretic at current, states he thinks his sugar is low. Pt is type 1 diabetic with insulin pump to right lower abdomen. Denies cardiac hx. Reports SOB.

## 2013-12-18 ENCOUNTER — Encounter (HOSPITAL_COMMUNITY)
Admission: EM | Disposition: A | Discharge status: Home / Self Care | Payer: Self-pay | Source: Home / Self Care | Attending: Emergency Medicine

## 2013-12-18 DIAGNOSIS — R079 Chest pain, unspecified: Secondary | ICD-10-CM

## 2013-12-18 HISTORY — PX: LEFT HEART CATHETERIZATION WITH CORONARY ANGIOGRAM: SHX5451

## 2013-12-18 LAB — GLUCOSE, CAPILLARY
Glucose-Capillary: 128 mg/dL — ABNORMAL HIGH (ref 70–99)
Glucose-Capillary: 191 mg/dL — ABNORMAL HIGH (ref 70–99)
Glucose-Capillary: 180 mg/dL — ABNORMAL HIGH (ref 70–99)
Glucose-Capillary: 165 mg/dL — ABNORMAL HIGH (ref 70–99)
Glucose-Capillary: 151 mg/dL — ABNORMAL HIGH (ref 70–99)
Glucose-Capillary: 246 mg/dL — ABNORMAL HIGH (ref 70–99)

## 2013-12-18 LAB — LIPID PANEL
Cholesterol: 131 mg/dL (ref 0–200)
HDL: 51 mg/dL (ref >39)
LDL Cholesterol: 60 mg/dL (ref 0–99)
Total CHOL/HDL Ratio: 2.6 RATIO
Triglycerides: 102 mg/dL (ref <150)
VLDL: 20 mg/dL (ref 0–40)

## 2013-12-18 LAB — BASIC METABOLIC PANEL
BUN: 18 mg/dL (ref 6–23)
CO2: 26 mEq/L (ref 19–32)
Calcium: 9.3 mg/dL (ref 8.4–10.5)
Chloride: 97 mEq/L (ref 96–112)
Creatinine, Ser: 1.19 mg/dL (ref 0.50–1.35)
GFR calc Af Amer: 86 mL/min — ABNORMAL LOW (ref >90)
GFR calc non Af Amer: 74 mL/min — ABNORMAL LOW (ref >90)
Glucose, Bld: 125 mg/dL — ABNORMAL HIGH (ref 70–99)
Potassium: 4.5 mEq/L (ref 3.7–5.3)
Sodium: 135 mEq/L — ABNORMAL LOW (ref 137–147)

## 2013-12-18 LAB — TSH: TSH: 1.073 u[IU]/mL (ref 0.350–4.500)

## 2013-12-18 LAB — TROPONIN I
Troponin I: <0.3 ng/mL (ref <0.30)
Troponin I: <0.3 ng/mL (ref <0.30)

## 2013-12-18 LAB — CBC
HCT: 35.6 % — ABNORMAL LOW (ref 39.0–52.0)
Hemoglobin: 12.4 g/dL — ABNORMAL LOW (ref 13.0–17.0)
MCH: 29 pg (ref 26.0–34.0)
MCHC: 34.8 g/dL (ref 30.0–36.0)
MCV: 83.4 fL (ref 78.0–100.0)
Platelets: 232 10*3/uL (ref 150–400)
RBC: 4.27 MIL/uL (ref 4.22–5.81)
RDW: 13 % (ref 11.5–15.5)
WBC: 7.4 10*3/uL (ref 4.0–10.5)

## 2013-12-18 LAB — HEMOGLOBIN A1C
Hgb A1c MFr Bld: 10.4 % — ABNORMAL HIGH (ref <5.7)
Mean Plasma Glucose: 252 mg/dL — ABNORMAL HIGH (ref <117)

## 2013-12-18 SURGERY — LEFT HEART CATHETERIZATION WITH CORONARY ANGIOGRAM
Anesthesia: LOCAL

## 2013-12-18 MED ORDER — VERAPAMIL HCL 2.5 MG/ML IV SOLN
INTRAVENOUS | Status: AC
  Filled 2013-12-18: qty 2

## 2013-12-18 MED ORDER — LIDOCAINE HCL (PF) 1 % IJ SOLN
INTRAMUSCULAR | Status: AC
  Filled 2013-12-18: qty 30

## 2013-12-18 MED ORDER — HEPARIN (PORCINE) IN NACL 2-0.9 UNIT/ML-% IJ SOLN
INTRAMUSCULAR | Status: AC
  Filled 2013-12-18: qty 1000

## 2013-12-18 MED ORDER — MIDAZOLAM HCL 2 MG/2ML IJ SOLN
INTRAMUSCULAR | Status: AC
  Filled 2013-12-18: qty 2

## 2013-12-18 MED ORDER — FENTANYL CITRATE 0.05 MG/ML IJ SOLN
INTRAMUSCULAR | Status: AC
  Filled 2013-12-18: qty 2

## 2013-12-18 MED ORDER — HEPARIN SODIUM (PORCINE) 1000 UNIT/ML IJ SOLN
INTRAMUSCULAR | Status: AC
  Filled 2013-12-18: qty 1

## 2013-12-18 MED ORDER — SODIUM CHLORIDE 0.9 % IV SOLN
1.0000 mL/kg/h | INTRAVENOUS | Status: AC
  Administered 2013-12-18: 1 mL/kg/h via INTRAVENOUS

## 2013-12-18 MED ORDER — NITROGLYCERIN 0.2 MG/ML ON CALL CATH LAB
INTRAVENOUS | Status: AC
  Filled 2013-12-18: qty 1

## 2013-12-18 NOTE — Discharge Instructions (Signed)
Radial Site Care Refer to this sheet in the next few weeks. These instructions provide you with information on caring for yourself after your procedure. Your caregiver may also give you more specific instructions. Your treatment has been planned according to current medical practices, but problems sometimes occur. Call your caregiver if you have any problems or questions after your procedure. HOME CARE INSTRUCTIONS  You may shower the day after the procedure.Remove the bandage (dressing) and gently wash the site with plain soap and water.Gently pat the site dry.  Do not apply powder or lotion to the site.  Do not submerge the affected site in water for 3 to 5 days.  Inspect the site at least twice daily.  Do not flex or bend the affected arm for 24 hours.  No lifting over 5 pounds (2.3 kg) for 5 days after your procedure.  Do not drive home if you are discharged the same day of the procedure. Have someone else drive you.  You may drive 24 hours after the procedure unless otherwise instructed by your caregiver.  Do not operate machinery or power tools for 24 hours.  A responsible adult should be with you for the first 24 hours after you arrive home. What to expect:  Any bruising will usually fade within 1 to 2 weeks.  Blood that collects in the tissue (hematoma) may be painful to the touch. It should usually decrease in size and tenderness within 1 to 2 weeks. SEEK IMMEDIATE MEDICAL CARE IF:  You have unusual pain at the radial site.  You have redness, warmth, swelling, or pain at the radial site.  You have drainage (other than a small amount of blood on the dressing).  You have chills.  You have a fever or persistent symptoms for more than 72 hours.  You have a fever and your symptoms suddenly get worse.  Your arm becomes pale, cool, tingly, or numb.  You have heavy bleeding from the site. Hold pressure on the site. Document Released: 10/23/2010 Document Revised:  12/13/2011 Document Reviewed: 10/23/2010 [facility] Patient Information 2014 [place], [place].   Diabetes Meal Planning Guide The diabetes meal planning guide is a tool to help you plan your meals and snacks. It is important for people with diabetes to manage their blood glucose (sugar) levels. Choosing the right foods and the right amounts throughout your day will help control your blood glucose. Eating right can even help you improve your blood pressure and reach or maintain a healthy weight. CARBOHYDRATE COUNTING MADE EASY When you eat carbohydrates, they turn to sugar. This raises your blood glucose level. Counting carbohydrates can help you control this level so you feel better. When you plan your meals by counting carbohydrates, you can have more flexibility in what you eat and balance your medicine with your food intake. Carbohydrate counting simply means adding up the total amount of carbohydrate grams in your meals and snacks. Try to eat about the same amount at each meal. Foods with carbohydrates are listed below. Each portion below is 1 carbohydrate serving or 15 grams of carbohydrates. Ask your dietician how many grams of carbohydrates you should eat at each meal or snack. Grains and Starches  1 slice bread.   English muffin or hotdog/hamburger bun.   cup cold cereal (unsweetened).   cup cooked pasta or rice.   cup starchy vegetables (corn, potatoes, peas, beans, winter squash).  1 tortilla (6 inches).   bagel.  1 waffle or pancake (size of a CD).  cup cooked cereal.  4 to 6 small crackers. *Whole grain is recommended. Fruit  1 cup fresh unsweetened berries, melon, papaya, pineapple.  1 small fresh fruit.   banana or mango.   cup fruit juice (4 oz unsweetened).   cup canned fruit in natural juice or water.  2 tbs dried fruit.  12 to 15 grapes or cherries. Milk and Yogurt  1 cup fat-free or 1% milk.  1 cup soy milk.  6 oz light yogurt with  sugar-free sweetener.  6 oz low-fat soy yogurt.  6 oz plain yogurt. Vegetables  1 cup raw or  cup cooked is counted as 0 carbohydrates or a "free" food.  If you eat 3 or more servings at 1 meal, count them as 1 carbohydrate serving. Other Carbohydrates   oz chips or pretzels.   cup ice cream or frozen yogurt.   cup sherbet or sorbet.  2 inch square cake, no frosting.  1 tbs honey, sugar, jam, jelly, or syrup.  2 small cookies.  3 squares of graham crackers.  3 cups popcorn.  6 crackers.  1 cup broth-based soup.  Count 1 cup casserole or other mixed foods as 2 carbohydrate servings.  Foods with less than 20 calories in a serving may be counted as 0 carbohydrates or a "free" food. You may want to purchase a book or computer software that lists the carbohydrate gram counts of different foods. In addition, the nutrition facts panel on the labels of the foods you eat are a good source of this information. The label will tell you how big the serving size is and the total number of carbohydrate grams you will be eating per serving. Divide this number by 15 to obtain the number of carbohydrate servings in a portion. Remember, 1 carbohydrate serving equals 15 grams of carbohydrate. SERVING SIZES Measuring foods and serving sizes helps you make sure you are getting the right amount of food. The list below tells how big or small some common serving sizes are.  1 oz.........4 stacked dice.  3 oz........Marland KitchenDeck of cards.  1 tsp.......Marland KitchenTip of little finger.  1 tbs......Marland KitchenMarland KitchenThumb.  2 tbs.......Marland KitchenGolf ball.   cup......Marland KitchenHalf of a fist.  1 cup.......Marland KitchenA fist. SAMPLE DIABETES MEAL PLAN Below is a sample meal plan that includes foods from the grain and starches, dairy, vegetable, fruit, and meat groups. A dietician can individualize a meal plan to fit your calorie needs and tell you the number of servings needed from each food group. However, controlling the total amount of  carbohydrates in your meal or snack is more important than making sure you include all of the food groups at every meal. You may interchange carbohydrate containing foods (dairy, starches, and fruits). The meal plan below is an example of a 2000 calorie diet using carbohydrate counting. This meal plan has 17 carbohydrate servings. Breakfast  1 cup oatmeal (2 carb servings).   cup light yogurt (1 carb serving).  1 cup blueberries (1 carb serving).   cup almonds. Snack  1 large apple (2 carb servings).  1 low-fat string cheese stick. Lunch  Chicken breast salad.  1 cup spinach.   cup chopped tomatoes.  2 oz chicken breast, sliced.  2 tbs low-fat [place] dressing.  12 whole-wheat crackers (2 carb servings).  12 to 15 grapes (1 carb serving).  1 cup low-fat milk (1 carb serving). Snack  1 cup carrots.   cup hummus (1 carb serving). Dinner  3 oz broiled salmon.  1 cup brown  rice (3 carb servings). Snack  1  cups steamed broccoli (1 carb serving) drizzled with 1 tsp olive oil and lemon juice.  1 cup light pudding (2 carb servings). DIABETES MEAL PLANNING WORKSHEET Your dietician can use this worksheet to help you decide how many servings of foods and what types of foods are right for you.  BREAKFAST Food Group and Servings / Carb Servings Grain/Starches __________________________________ Dairy __________________________________________ Vegetable ______________________________________ Fruit ___________________________________________ Meat __________________________________________ Fat ____________________________________________ LUNCH Food Group and Servings / Carb Servings Grain/Starches ___________________________________ Dairy ___________________________________________ Fruit ____________________________________________ Meat ___________________________________________ Fat _____________________________________________ [name] Food Group and Servings /  Carb Servings Grain/Starches ___________________________________ Dairy ___________________________________________ Fruit ____________________________________________ Meat ___________________________________________ Fat _____________________________________________ SNACKS Food Group and Servings / Carb Servings Grain/Starches ___________________________________ Dairy ___________________________________________ Vegetable _______________________________________ Fruit ____________________________________________ Meat ___________________________________________ Fat _____________________________________________ DAILY TOTALS Starches _________________________ Vegetable ________________________ Fruit ____________________________ Dairy ____________________________ Meat ____________________________ Fat ______________________________ Document Released: 06/17/2005 Document Revised: 12/13/2011 Document Reviewed: 04/28/2009 [employer] Patient Information 2014 [employer].

## 2013-12-18 NOTE — Progress Notes (Signed)
Noted referral for assistance with insulin pump since patient is planned for Heart Catheterization today.  Talked with Harriett, RN in Cath Lab and she reports that patient is scheduled for Cath today at 1:30. Since patient is able to keep insulin pump on during cath, recommend leaving insulin pump on during heart cath.  Have asked that RN in cath lab monitor blood glucose at least every 30 mins during and after procedure.  After procedure after patient wakes up from sedation, check blood glucose and have patient do bolus correction for blood glucose if needed.    Talked with the patient about insulin pump and home regimen for diabetes control.  Patient is followed by Dr. Chalmers Cater and has been on an insulin pump for about a year now. Patient has a Medtronic 530G insulin pump.  The following are his settings:  Basal rates 12A-3A 0.9 units/hour 3A-11A 0.925 units/hour 11A-6P 0.850 units/hour 6P-12A 1.25 units/hour  Carb to insulin ratio 12A-5P 1:8 (1 unit for every 8 grams of carbs) 5P-12A 1:7 (1 unit for every 7 grams of carbs)  Sensitivity Factor 1:25 (1 units drops blood glucose 25 mg/dl) Target Glucose  120 mg/dl  Patient reports that he blood sugar usually runs "pretty good". Inquired about glucose of 46 mg/dl at the time of presentation to the ED on day of admission. Patient states that his chest was hurting that morning and he did not eat much breakfast and he did not eat anything for lunch prior to coming to the ED.   He states that he rarely has a low blood sugar.  Discussed patient preference regarding insulin pump during heart cath.  Patient states that he prefers to keep his insulin pump on if possible.  Informed patient that I have talked with Harriett, RN in cath lab and was told that patient could keep insulin pump on during procedure and they would cover the pump to keep it protected during procedure.  Also informed patient that Nursing staff in Cath Lab have been asked to check blood  glucose levels at least every 30 mins and to call Inpatient Diabetes Team if issues arise or if the procedure will last longer than planned.  Talked with Janett Billow, RN (Bedside RN on 3W) regarding conversation with Cath Lab and patient. Bennie Pierini, RN to check blood glucose prior to patient leaving floor and be sure to report to Cath Lab RN.  Will continue to follow.  Again, please page [phone]) if additional assistance is needed from Inpatient Diabetes Coordinator or if issues arise.  Thanks, Barnie Alderman, RN, MSN, CCRN Diabetes Coordinator Inpatient Diabetes Program [phone] (Team Pager) [phone] ([facility] office) [phone] [facility] office)

## 2013-12-18 NOTE — H&P (View-Only) (Signed)
Patient Name: [name] Date of Encounter: 12/18/2013     Active Problems:   Chest pain   IDDM (insulin dependent diabetes mellitus)   Hypercholesteremia   HTN (hypertension)    SUBJECTIVE  The patient feels well this morning.  No significant chest discomfort overnight.  Cardiac enzymes are normal.  EKG on admission showed only minor nonspecific changes.  CURRENT MEDS . aspirin EC  81 mg Oral Daily  . atorvastatin  40 mg Oral Daily  . dextrose  1 ampule Intravenous Once  . heparin  5,000 Units Subcutaneous 3 times per day  . insulin pump   Subcutaneous TID AC, HS, 0200  . lisinopril  20 mg Oral BID  . sodium chloride  3 mL Intravenous Q12H    OBJECTIVE  Filed Vitals:   12/17/13 1630 12/17/13 1700 12/17/13 2232 12/18/13 0500  BP: 140/83 137/86 120/72 127/74  Pulse: 89 85 95 92  Temp:   98.5 F (36.9 C) 97.4 F (36.3 C)  TempSrc:      Resp: 24 18 20 18   Weight:    240 lb (108.863 kg)  SpO2: 98% 100% 97% 99%   No intake or output data in the 24 hours ending 12/18/13 0855 Filed Weights   12/17/13 1357 12/18/13 0500  Weight: 240 lb (108.863 kg) 240 lb (108.863 kg)    PHYSICAL EXAM  General: Pleasant, NAD. Neuro: Alert and oriented X 3. Moves all extremities spontaneously. Psych: Normal affect. HEENT:  Normal  Neck: Supple without bruits or JVD. Lungs:  Resp regular and unlabored, CTA. Heart: RRR no s3, s4, or murmurs. Abdomen: Soft, non-tender, non-distended, BS + x 4.  Extremities: No clubbing, cyanosis or edema. DP/PT/Radials 2+ and equal bilaterally.  Accessory Clinical Findings  CBC  Recent Labs  12/17/13 1400 12/18/13 0215  WBC 7.6 7.4  HGB 14.1 12.4*  HCT 40.4 35.6*  MCV 83.5 83.4  PLT 271 [id number]   Basic Metabolic Panel  Recent Labs  12/17/13 1400 12/18/13 0215  NA 136* 135*  K 4.7 4.5  CL 96 97  CO2 26 26  GLUCOSE 107* 125*  BUN 18 18  CREATININE 1.12 1.19  CALCIUM 9.9 9.3   Liver Function Tests  Recent Labs  12/17/13 2200  AST 17  ALT 14  ALKPHOS 65  BILITOT 0.4  PROT 6.8  ALBUMIN 3.3*   No results found for this basename: LIPASE, AMYLASE,  in the last 72 hours Cardiac Enzymes  Recent Labs  12/17/13 2200 12/18/13 0145  TROPONINI <0.30 <0.30   BNP No components found with this basename: POCBNP,  D-Dimer No results found for this basename: DDIMER,  in the last 72 hours Hemoglobin A1C  Recent Labs  12/17/13 2200  HGBA1C 10.4*   Fasting Lipid Panel  Recent Labs  12/18/13 0215  CHOL 131  HDL 51  LDLCALC 60  TRIG 102  CHOLHDL 2.6   Thyroid Function Tests  Recent Labs  12/17/13 2200  TSH 1.073    TELE  Normal sinus rhythm  ECG  Normal sinus rhythm with minor nonspecific ST-T wave changes  Radiology/Studies  Dg Chest 2 View  12/17/2013   CLINICAL DATA:  Shortness of Breath, left side chest pain  EXAM: CHEST  2 VIEW  COMPARISON:  11/25/2012  FINDINGS: Cardiomediastinal silhouette is stable. Mild elevation of the left hemidiaphragm again noted. No acute infiltrate or pleural effusion. No pulmonary edema. Bony thorax is unremarkable.  IMPRESSION: No active cardiopulmonary disease.   Electronically Signed  By: Lahoma Crocker M.D.   On: 12/17/2013 15:33    ASSESSMENT AND PLAN 1. Chest pain, somewhat atypical but in setting of 18+ yrs diabetes  2. Diabetes mellitus, on insulin pump  3. HTN, controlled here but patient reports running higher recently  4. Hyperlipidemia, on statin  Plan: Cardiac catheterization today.   Signed, Darlin Coco MD

## 2013-12-18 NOTE — Interval H&P Note (Signed)
History and Physical Interval Note:  12/18/2013 1:21 PM  [name]  has presented today for surgery, with the diagnosis of chest pain  The various methods of treatment have been discussed with the patient and family. After consideration of risks, benefits and other options for treatment, the patient has consented to  Procedure(s): LEFT HEART CATHETERIZATION WITH CORONARY ANGIOGRAM (N/A) as a surgical intervention .  The patient's history has been reviewed, patient examined, no change in status, stable for surgery.  I have reviewed the patient's chart and labs.  Questions were answered to the patient's satisfaction.   Cath Lab Visit (complete for each Cath Lab visit)  Clinical Evaluation Leading to the Procedure:   ACS: yes  Non-ACS:    Anginal Classification: CCS III  Anti-ischemic medical therapy: No Therapy  Non-Invasive Test Results: No non-invasive testing performed  Prior CABG: No previous CABG        [name] Martinique MD,FACC 12/18/2013 1:21 PM

## 2013-12-18 NOTE — Discharge Summary (Signed)
Physician Discharge Summary     Patient ID: [name] MRN: [id number] DOB/AGE: Mar 03, 1971 43 y.o. 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 He reportedly underwent cath over 10 yrs ago in [place] that was normal. He saw Dr. Johnsie [name] in 2013 for atypical CP/dyspnea with normal ETT & normal echo (except mild-mod LVH), as well as palpitations/sinus tach with 24-hr Holter showing NSR with occ PVC, no VT, avg HR 96. He is good friends with [name] in the [employer]. He has not had any recent palpitations. However, since Wed of last week (3/11) he has had intermittent chest discomfort that lasts anywhere from 15 seconds up to maximum 5 minutes. It is sometimes sharp and sometimes squeezing. There are no inciting factors including exertion, palpation, or inspiration. When it first started it was very rare and he thought it might just be heartburn. However, over the last 3-4 days it has occurred with increasing frequency. Today he's had it just about every hour. It is sometimes associated with R arm discomfort, finger tingling, and R leg discomfort. He has chronic underlying unchanged dyspnea but does feel more tired recently. He occasional feels nausea, SOB with the chest discomfort but not every time. Today he had an episode brought on by standing up for the CXR. He also had an episode while I was in the room lasting about 15 seconds not associated with any telemetry abnormalities. He denies any leg erythema, edema, recent  travel/surgery/bedrest, syncope, near syncope, fevers, weight changes. He does not exercise. His job is not very physical but he denies any recent exertional sx - the CP seems to occur at random. He has noticed his BP running higher lately. He does admit he may not have been very compliant with his Lisinopril and Lipitor in the past but for the past few weeks has been really committed to taking it.    In the ER, labs significant for negative troponin x 1, normal CBC, glu 106 dropping to 46 in the ER but this was due to insulin pump in the setting of missed meals. He denies any other recent hypoglycemic events. He is not hypoxic or tachycardic in the ER.   The patient was admitted and set up for left heart cath.  He ruled out for MI.  The procedure revealed normal coronary arteries and LV function.  Bp controlled on ACE-I.  lipitor for HLD.  The patient was seen by Dr. Mare [name] who felt he was stable for DC home.  Follow up arranged.   Consults: None  Significant Diagnostic Studies:  LEftheart cath Procedural Findings:  Hemodynamics:  AO 95/65 mean 77 mm Hg  LV 103/8 mm Hg  Coronary angiography:  Coronary dominance: right  Left mainstem: Normal  Left anterior descending (LAD): Normal  Left circumflex (LCx): Normal  Right coronary artery (RCA): Normal  Left ventriculography: Left ventricular systolic function is normal, LVEF is estimated at 55-65%, there is no significant mitral regurgitation  Final Conclusions:  1. Normal coronary arteries.  2. Normal  LV function.  Recommendations: risk factor modification.  [name]  Cardiac Panel (last 3 results)  Recent Labs  12/17/13 2200 12/18/13 0145 12/18/13 0825  TROPONINI <0.30 <0.30 <0.30    Treatments: See above  Discharge Exam: Blood pressure 134/79, pulse 92, temperature 97.4 F (36.3 C), temperature source Oral, resp. rate 18, weight 240 lb (108.863 kg), SpO2 99.00%.   Disposition: 01-Home or Self Care        Discharge Orders   Future Appointments Provider Department Dept Phone   01/01/2014 4:15 PM Matthew Hector, MD [facility] [phone]   Future Orders Complete By Expires   Diet - low sodium heart healthy  As directed    Discharge instructions  As directed    Comments:     No lifting with your right arm for three days.   Increase activity slowly  As directed        Medication List         atorvastatin 40 MG tablet  Commonly known as:  LIPITOR  Take 40 mg by mouth daily.     ibuprofen 200 MG tablet  Commonly known as:  ADVIL,MOTRIN  Take 200 mg by mouth every 6 (six) hours as needed.     insulin lispro 100 UNIT/ML injection  Commonly known as:  HUMALOG  Inject into the skin 3 (three) times daily before meals. For use via insulin pump     lisinopril 20 MG tablet  Commonly known as:  PRINIVIL,ZESTRIL  Take 20 mg by mouth 2 (two) times daily.     sildenafil 100 MG tablet  Commonly known as:  VIAGRA  Take 100 mg by mouth daily as needed for erectile dysfunction.       Follow-up Information   Follow up with Matthew Rouge, MD On 01/01/2014. (4:15PM)    Specialty:  Cardiology   Contact information:   [id number] [place] [address] [phone]       Signed: Tarri [name] 12/18/2013, 2:41 PM

## 2013-12-18 NOTE — Progress Notes (Signed)
Patient Name: [name] Date of Encounter: 12/18/2013     Active Problems:   Chest pain   IDDM (insulin dependent diabetes mellitus)   Hypercholesteremia   HTN (hypertension)    SUBJECTIVE  The patient feels well this morning.  No significant chest discomfort overnight.  Cardiac enzymes are normal.  EKG on admission showed only minor nonspecific changes.  CURRENT MEDS . aspirin EC  81 mg Oral Daily  . atorvastatin  40 mg Oral Daily  . dextrose  1 ampule Intravenous Once  . heparin  5,000 Units Subcutaneous 3 times per day  . insulin pump   Subcutaneous TID AC, HS, 0200  . lisinopril  20 mg Oral BID  . sodium chloride  3 mL Intravenous Q12H    OBJECTIVE  Filed Vitals:   12/17/13 1630 12/17/13 1700 12/17/13 2232 12/18/13 0500  BP: 140/83 137/86 120/72 127/74  Pulse: 89 85 95 92  Temp:   98.5 F (36.9 C) 97.4 F (36.3 C)  TempSrc:      Resp: 24 18 20 18   Weight:    240 lb (108.863 kg)  SpO2: 98% 100% 97% 99%   No intake or output data in the 24 hours ending 12/18/13 0855 Filed Weights   12/17/13 1357 12/18/13 0500  Weight: 240 lb (108.863 kg) 240 lb (108.863 kg)    PHYSICAL EXAM  General: Pleasant, NAD. Neuro: Alert and oriented X 3. Moves all extremities spontaneously. Psych: Normal affect. HEENT:  Normal  Neck: Supple without bruits or JVD. Lungs:  Resp regular and unlabored, CTA. Heart: RRR no s3, s4, or murmurs. Abdomen: Soft, non-tender, non-distended, BS + x 4.  Extremities: No clubbing, cyanosis or edema. DP/PT/Radials 2+ and equal bilaterally.  Accessory Clinical Findings  CBC  Recent Labs  12/17/13 1400 12/18/13 0215  WBC 7.6 7.4  HGB 14.1 12.4*  HCT 40.4 35.6*  MCV 83.5 83.4  PLT 271 [id number]   Basic Metabolic Panel  Recent Labs  12/17/13 1400 12/18/13 0215  NA 136* 135*  K 4.7 4.5  CL 96 97  CO2 26 26  GLUCOSE 107* 125*  BUN 18 18  CREATININE 1.12 1.19  CALCIUM 9.9 9.3   Liver Function Tests  Recent Labs  12/17/13 2200  AST 17  ALT 14  ALKPHOS 65  BILITOT 0.4  PROT 6.8  ALBUMIN 3.3*   No results found for this basename: LIPASE, AMYLASE,  in the last 72 hours Cardiac Enzymes  Recent Labs  12/17/13 2200 12/18/13 0145  TROPONINI <0.30 <0.30   BNP No components found with this basename: POCBNP,  D-Dimer No results found for this basename: DDIMER,  in the last 72 hours Hemoglobin A1C  Recent Labs  12/17/13 2200  HGBA1C 10.4*   Fasting Lipid Panel  Recent Labs  12/18/13 0215  CHOL 131  HDL 51  LDLCALC 60  TRIG 102  CHOLHDL 2.6   Thyroid Function Tests  Recent Labs  12/17/13 2200  TSH 1.073    TELE  Normal sinus rhythm  ECG  Normal sinus rhythm with minor nonspecific ST-T wave changes  Radiology/Studies  Dg Chest 2 View  12/17/2013   CLINICAL DATA:  Shortness of Breath, left side chest pain  EXAM: CHEST  2 VIEW  COMPARISON:  11/25/2012  FINDINGS: Cardiomediastinal silhouette is stable. Mild elevation of the left hemidiaphragm again noted. No acute infiltrate or pleural effusion. No pulmonary edema. Bony thorax is unremarkable.  IMPRESSION: No active cardiopulmonary disease.   Electronically Signed  By: Lahoma Crocker M.D.   On: 12/17/2013 15:33    ASSESSMENT AND PLAN 1. Chest pain, somewhat atypical but in setting of 18+ yrs diabetes  2. Diabetes mellitus, on insulin pump  3. HTN, controlled here but patient reports running higher recently  4. Hyperlipidemia, on statin  Plan: Cardiac catheterization today.   Signed, Darlin Coco MD

## 2013-12-18 NOTE — CV Procedure (Signed)
    Cardiac Catheterization Procedure Note  Name: [name] MRN: [id number] DOB: 06-07-1971  Procedure: Left Heart Cath, Selective Coronary Angiography, LV angiography  Indication: 43 yo WM with longstanding IDDM presents with symptoms of chest pain.   Procedural Details: The right wrist was prepped, draped, and anesthetized with 1% lidocaine. Using the modified Seldinger technique, a 5 French sheath was introduced into the right radial artery. 3 mg of verapamil was administered through the sheath, weight-based unfractionated heparin was administered intravenously. Standard Judkins catheters were used for selective coronary angiography and left ventriculography. Catheter exchanges were performed over an exchange length guidewire. After initial angiography of the RCA there was severe spasm of the radial and brachial arteries. I was unable to pass the left coronary catheter. After additional 2 doses of IA Verapamil and Ntg I was able to complete the procedure using 4 Fr. Catheters. There were no immediate procedural complications. A TR band was used for radial hemostasis at the completion of the procedure.  The patient was transferred to the post catheterization recovery area for further monitoring.  Procedural Findings: Hemodynamics: AO 95/65 mean 77 mm Hg LV 103/8 mm Hg  Coronary angiography: Coronary dominance: right  Left mainstem: Normal  Left anterior descending (LAD): Normal  Left circumflex (LCx): Normal  Right coronary artery (RCA): Normal  Left ventriculography: Left ventricular systolic function is normal, LVEF is estimated at 55-65%, there is no significant mitral regurgitation   Final Conclusions:   1. Normal coronary arteries. 2. Normal LV function.  Recommendations: risk factor modification.  Collier Salina [facility] 12/18/2013, 2:09 PM

## 2014-01-01 ENCOUNTER — Ambulatory Visit: Payer: 59 | Admitting: Cardiovascular Disease

## 2014-01-24 NOTE — Progress Notes (Signed)
Patient ID: [name], male   DOB: 09-19-1971, 43 y.o.   MRN: [id number] ATTENDING PHYSICIAN NOTE: I have reviewed the chart and agree with the plan as detailed above. Dorcas Mcmurray MD Pager [phone]

## 2014-06-07 ENCOUNTER — Emergency Department (HOSPITAL_COMMUNITY)
Admission: EM | Admit: 2014-06-07 | Discharge: 2014-06-07 | Disposition: A | Discharge status: Home / Self Care | Payer: 59 | Attending: Emergency Medicine | Admitting: Emergency Medicine

## 2014-06-07 ENCOUNTER — Emergency Department (HOSPITAL_COMMUNITY): Payer: 59

## 2014-06-07 ENCOUNTER — Encounter (HOSPITAL_COMMUNITY): Payer: Self-pay | Admitting: Emergency Medicine

## 2014-06-07 DIAGNOSIS — Z794 Long term (current) use of insulin: Secondary | ICD-10-CM | POA: Diagnosis not present

## 2014-06-07 DIAGNOSIS — Z8719 Personal history of other diseases of the digestive system: Secondary | ICD-10-CM | POA: Diagnosis not present

## 2014-06-07 DIAGNOSIS — Z79899 Other long term (current) drug therapy: Secondary | ICD-10-CM | POA: Diagnosis not present

## 2014-06-07 DIAGNOSIS — E7800 Pure hypercholesterolemia, unspecified: Secondary | ICD-10-CM | POA: Insufficient documentation

## 2014-06-07 DIAGNOSIS — E109 Type 1 diabetes mellitus without complications: Secondary | ICD-10-CM | POA: Insufficient documentation

## 2014-06-07 DIAGNOSIS — I10 Essential (primary) hypertension: Secondary | ICD-10-CM | POA: Diagnosis not present

## 2014-06-07 DIAGNOSIS — R739 Hyperglycemia, unspecified: Secondary | ICD-10-CM

## 2014-06-07 DIAGNOSIS — Z9889 Other specified postprocedural states: Secondary | ICD-10-CM | POA: Diagnosis not present

## 2014-06-07 LAB — TROPONIN I: Troponin I: <0.3 ng/mL (ref <0.30)

## 2014-06-07 LAB — CBC
HCT: 37.8 % — ABNORMAL LOW (ref 39.0–52.0)
Hemoglobin: 13.1 g/dL (ref 13.0–17.0)
MCH: 28.5 pg (ref 26.0–34.0)
MCHC: 34.7 g/dL (ref 30.0–36.0)
MCV: 82.2 fL (ref 78.0–100.0)
Platelets: 250 10*3/uL (ref 150–400)
RBC: 4.6 MIL/uL (ref 4.22–5.81)
RDW: 13.2 % (ref 11.5–15.5)
WBC: 6 10*3/uL (ref 4.0–10.5)

## 2014-06-07 LAB — COMPREHENSIVE METABOLIC PANEL
ALT: 17 U/L (ref 0–53)
AST: 16 U/L (ref 0–37)
Albumin: 3.5 g/dL (ref 3.5–5.2)
Alkaline Phosphatase: 80 U/L (ref 39–117)
Anion gap: 12 (ref 5–15)
BUN: 27 mg/dL — ABNORMAL HIGH (ref 6–23)
CO2: 24 mEq/L (ref 19–32)
Calcium: 10.4 mg/dL (ref 8.4–10.5)
Chloride: 91 mEq/L — ABNORMAL LOW (ref 96–112)
Creatinine, Ser: 1.34 mg/dL (ref 0.50–1.35)
GFR calc Af Amer: 75 mL/min — ABNORMAL LOW (ref >90)
GFR calc non Af Amer: 64 mL/min — ABNORMAL LOW (ref >90)
Glucose, Bld: 534 mg/dL — ABNORMAL HIGH (ref 70–99)
Potassium: 5.1 mEq/L (ref 3.7–5.3)
Sodium: 127 mEq/L — ABNORMAL LOW (ref 137–147)
Total Bilirubin: 0.4 mg/dL (ref 0.3–1.2)
Total Protein: 7.6 g/dL (ref 6.0–8.3)

## 2014-06-07 LAB — URINALYSIS, ROUTINE W REFLEX MICROSCOPIC
Bilirubin Urine: NEGATIVE
Glucose, UA: >1000 mg/dL — AB
Ketones, ur: NEGATIVE mg/dL
Leukocytes, UA: NEGATIVE
Nitrite: NEGATIVE
Protein, ur: NEGATIVE mg/dL
Specific Gravity, Urine: 1.027 (ref 1.005–1.030)
Urobilinogen, UA: 0.2 mg/dL (ref 0.0–1.0)
pH: 5 (ref 5.0–8.0)

## 2014-06-07 LAB — URINE MICROSCOPIC-ADD ON

## 2014-06-07 LAB — CBG MONITORING, ED
Glucose-Capillary: 156 mg/dL — ABNORMAL HIGH (ref 70–99)
Glucose-Capillary: 55 mg/dL — ABNORMAL LOW (ref 70–99)
Glucose-Capillary: 574 mg/dL (ref 70–99)
Glucose-Capillary: 86 mg/dL (ref 70–99)

## 2014-06-07 MED ORDER — GI COCKTAIL ~~LOC~~
30.0000 mL | Freq: Once | ORAL | Status: AC
  Administered 2014-06-07: 30 mL via ORAL
  Filled 2014-06-07: qty 30

## 2014-06-07 MED ORDER — INSULIN ASPART 100 UNIT/ML ~~LOC~~ SOLN
13.0000 [IU] | Freq: Once | SUBCUTANEOUS | Status: AC
  Administered 2014-06-07: 13 [IU] via INTRAVENOUS
  Filled 2014-06-07: qty 1

## 2014-06-07 MED ORDER — SODIUM CHLORIDE 0.9 % IV BOLUS (SEPSIS)
2000.0000 mL | Freq: Once | INTRAVENOUS | Status: AC
  Administered 2014-06-07: 2000 mL via INTRAVENOUS

## 2014-06-07 NOTE — ED Notes (Signed)
Pt wears an insulin pump and states that he was feeling weird tonight when he got to work and his CBG wouldn't read on his meter.

## 2014-06-07 NOTE — Discharge Instructions (Signed)

## 2014-06-07 NOTE — ED Notes (Signed)
CBG >500, will notify RN.

## 2014-06-07 NOTE — ED Notes (Addendum)
Notified RN, Lilibeth pt. CBG 86. Pt. CBG did not transmit in meter.

## 2014-06-07 NOTE — ED Notes (Signed)
Pt states that he feels nauseated and having heartburn

## 2014-06-07 NOTE — ED Notes (Signed)
Patient transported to X-ray 

## 2014-06-07 NOTE — ED Provider Notes (Signed)
CSN: [id number]     Arrival date & time 06/07/14  0047 History   First MD Initiated Contact with Patient 06/07/14 0107     Chief Complaint  Patient presents with  . Hyperglycemia     (Consider location/radiation/quality/duration/timing/severity/associated sxs/prior Treatment) HPI  43 year old male with hyperglycemia. Patient has a past history of insulin-dependent diabetes with insulin pump. Today he went to work and felt a little bit dizzy. Has been infrequently urinating and feeling thirsty for the past several hours. He checked his blood sugar was greater than 500 to keep the emergency room. Since then pump is currently working and appears to function appropriately. Recent medication changes. Denies any fever or chills. No acute pain. Patient recently changing his shift at work. Sleeping and eating patterns change have significantly changes. can identify otherwise.  Past Medical History  Diagnosis Date  . Type 1 diabetes mellitus     a. With insulin pump.  . Gastroparesis   . Hypercholesteremia   . S/P cardiac cath     a. 10-15 yrs ago at [facility] due to tachycardia, reportedly normal. b. Normal ETT 03/2012.  Marland Kitchen Sinus tachycardia     a. 24-hr Holter 03/2012 - SR, occ PVCs, no VT, avg HR 96bpm.  . HTN (hypertension)    Past Surgical History  Procedure Laterality Date  . Hernia repair      bilateral  . Cervical neck fusion     Family History  Problem Relation Age of Onset  . Hypertension    . Coronary artery disease      Mother's side - both her side's grandparents died of heart disease (MIs)  . Diabetes    . Stroke      Paternal grandfather (14)   History  Substance Use Topics  . Smoking status: Never Smoker   . Smokeless tobacco: Never Used  . Alcohol Use: Yes     Comment: Rare - 1 beer a month    Review of Systems  All systems reviewed and negative, other than as noted in HPI.    Allergies  Oxycontin; Prednisone; and Shellfish-derived products  Home Medications    Prior to Admission medications   Medication Sig Start Date End Date Taking? Authorizing Provider  atorvastatin (LIPITOR) 40 MG tablet Take 40 mg by mouth daily.   Yes Historical Provider, MD  insulin lispro (HUMALOG) 100 UNIT/ML injection Inject into the skin 3 (three) times daily before meals. For use via insulin pump   Yes Historical Provider, MD  lisinopril (PRINIVIL,ZESTRIL) 20 MG tablet Take 20 mg by mouth 2 (two) times daily.    Yes Historical Provider, MD  sildenafil (VIAGRA) 100 MG tablet Take 100 mg by mouth daily as needed for erectile dysfunction.   Yes Historical Provider, MD   BP 154/96  Pulse 99  Temp(Src) 98.1 F (36.7 C) (Oral)  Resp 16  Ht 6' (1.829 m)  Wt 230 lb (104.327 kg)  BMI 31.19 kg/m2  SpO2 96% Physical Exam  Nursing note and vitals reviewed. Constitutional: He is oriented to person, place, and time. He appears well-developed and well-nourished. No distress.  HENT:  Head: Normocephalic and atraumatic.  Eyes: Conjunctivae are normal. Right eye exhibits no discharge. Left eye exhibits no discharge.  Neck: Neck supple.  Cardiovascular: Normal rate, regular rhythm and normal heart sounds.  Exam reveals no gallop and no friction rub.   No murmur heard. Pulmonary/Chest: Effort normal and breath sounds normal. No respiratory distress.  Abdominal: Soft. He exhibits no distension. There  is no tenderness.  Musculoskeletal: He exhibits no edema and no tenderness.  Neurological: He is alert and oriented to person, place, and time. No cranial nerve deficit. He exhibits normal muscle tone. Coordination normal.  Skin: Skin is warm and dry.  Psychiatric: He has a normal mood and affect. His behavior is normal. Thought content normal.    ED Course  Procedures (including critical care time) Labs Review Labs Reviewed  CBC - Abnormal; Notable for the following:    HCT 37.8 (*)    All other components within normal limits  CBG MONITORING, ED - Abnormal; Notable for the  following:    Glucose-Capillary 574 (*)    All other components within normal limits  COMPREHENSIVE METABOLIC PANEL  URINALYSIS, ROUTINE W REFLEX MICROSCOPIC  TROPONIN I  CBG MONITORING, ED    Imaging Review No results found.   EKG Interpretation None      MDM   Final diagnoses:  Hyperglycemia   43 year old male with symptoms more than likely secondary to market hyperglycemia. No metabolic acidosis. No anion gap. No ketonuria. Improved with insulin, but over shot. Insulin pump was disconnected while in ED. Observed until normalized. No further complaints. Pt recently changing schedule. Sleeping patterns have changed. Physiologic stress of this as well as changed eating habits possibly precipitated. Afebrile. Appears well. Denies acute pain. I feel safe for DC at this time.   EKG abnormal, but changes noted to some degree on previous EKGs. Had cath less than 6 months ago with normal coronaries.     Virgel Manifold, MD 06/13/14 [phone]

## 2014-06-07 NOTE — ED Notes (Signed)
EKG given to EDP,Kohut,MD., for review. 

## 2014-09-12 ENCOUNTER — Encounter (HOSPITAL_COMMUNITY): Payer: Self-pay | Admitting: Cardiology

## 2014-12-16 ENCOUNTER — Emergency Department (HOSPITAL_COMMUNITY): Payer: PRIVATE HEALTH INSURANCE

## 2014-12-16 ENCOUNTER — Emergency Department (HOSPITAL_COMMUNITY)
Admission: EM | Admit: 2014-12-16 | Discharge: 2014-12-16 | Disposition: A | Discharge status: Home / Self Care | Payer: PRIVATE HEALTH INSURANCE | Attending: Emergency Medicine | Admitting: Emergency Medicine

## 2014-12-16 ENCOUNTER — Encounter (HOSPITAL_COMMUNITY): Payer: Self-pay | Admitting: Emergency Medicine

## 2014-12-16 DIAGNOSIS — Z79899 Other long term (current) drug therapy: Secondary | ICD-10-CM | POA: Diagnosis not present

## 2014-12-16 DIAGNOSIS — S0990XA Unspecified injury of head, initial encounter: Secondary | ICD-10-CM | POA: Diagnosis present

## 2014-12-16 DIAGNOSIS — S199XXA Unspecified injury of neck, initial encounter: Secondary | ICD-10-CM | POA: Diagnosis not present

## 2014-12-16 DIAGNOSIS — Z9889 Other specified postprocedural states: Secondary | ICD-10-CM | POA: Diagnosis not present

## 2014-12-16 DIAGNOSIS — M542 Cervicalgia: Secondary | ICD-10-CM

## 2014-12-16 DIAGNOSIS — Z9641 Presence of insulin pump (external) (internal): Secondary | ICD-10-CM | POA: Diagnosis not present

## 2014-12-16 DIAGNOSIS — Z8719 Personal history of other diseases of the digestive system: Secondary | ICD-10-CM | POA: Diagnosis not present

## 2014-12-16 DIAGNOSIS — Y9289 Other specified places as the place of occurrence of the external cause: Secondary | ICD-10-CM | POA: Insufficient documentation

## 2014-12-16 DIAGNOSIS — E780 Pure hypercholesterolemia: Secondary | ICD-10-CM | POA: Insufficient documentation

## 2014-12-16 DIAGNOSIS — W208XXA Other cause of strike by thrown, projected or falling object, initial encounter: Secondary | ICD-10-CM | POA: Insufficient documentation

## 2014-12-16 DIAGNOSIS — Z794 Long term (current) use of insulin: Secondary | ICD-10-CM | POA: Diagnosis not present

## 2014-12-16 DIAGNOSIS — I10 Essential (primary) hypertension: Secondary | ICD-10-CM | POA: Insufficient documentation

## 2014-12-16 DIAGNOSIS — Z981 Arthrodesis status: Secondary | ICD-10-CM | POA: Diagnosis not present

## 2014-12-16 DIAGNOSIS — Y990 Civilian activity done for income or pay: Secondary | ICD-10-CM | POA: Insufficient documentation

## 2014-12-16 DIAGNOSIS — E109 Type 1 diabetes mellitus without complications: Secondary | ICD-10-CM | POA: Insufficient documentation

## 2014-12-16 DIAGNOSIS — Y9389 Activity, other specified: Secondary | ICD-10-CM | POA: Diagnosis not present

## 2014-12-16 LAB — CBG MONITORING, ED: Glucose-Capillary: 287 mg/dL — ABNORMAL HIGH (ref 70–99)

## 2014-12-16 MED ORDER — ONDANSETRON 8 MG PO TBDP
8.0000 mg | ORAL_TABLET | Freq: Once | ORAL | Status: AC
  Administered 2014-12-16: 8 mg via ORAL
  Filled 2014-12-16: qty 1

## 2014-12-16 MED ORDER — OXYCODONE-ACETAMINOPHEN 5-325 MG PO TABS: 1.0000 | ORAL_TABLET | Freq: Four times a day (QID) | ORAL | Status: DC | PRN

## 2014-12-16 MED ORDER — ONDANSETRON 4 MG PO TBDP
4.0000 mg | ORAL_TABLET | Freq: Once | ORAL | Status: AC
  Administered 2014-12-16: 4 mg via ORAL
  Filled 2014-12-16: qty 1

## 2014-12-16 MED ORDER — ONDANSETRON HCL 4 MG PO TABS: 4.0000 mg | ORAL_TABLET | Freq: Four times a day (QID) | ORAL | Status: DC

## 2014-12-16 MED ORDER — OXYCODONE-ACETAMINOPHEN 5-325 MG PO TABS
2.0000 | ORAL_TABLET | Freq: Once | ORAL | Status: AC
  Administered 2014-12-16: 2 via ORAL
  Filled 2014-12-16: qty 2

## 2014-12-16 NOTE — Discharge Instructions (Signed)
Take pain medication as needed for severe pain.  Do not drive or operate heavy machinery for 4-6 hours after taking medication.

## 2014-12-16 NOTE — ED Notes (Signed)
Pt was working when a 40 lb box fell off a shelf and hit him in the back of the head. Pt states his vision blacked out for about 20 seconds, no loss of consciousness. Severe neck pain/stiffness since, hx of cervical spinal fusion. States also a diabetic and feels extremely nauseated

## 2014-12-16 NOTE — ED Provider Notes (Signed)
CSN: [id number]     Arrival date & time 12/16/14  1140 History   First MD Initiated Contact with Patient 12/16/14 1402     Chief Complaint  Patient presents with  . Neck Injury  . Head Injury  . Nausea     (Consider location/radiation/quality/duration/timing/severity/associated sxs/prior Treatment) HPI Comments: Patient presents today with pain to the back of the head and also the posterior neck.  He reports that just prior to arrival while at work a 40 lb box fell off of a shelf and hit him in the back in the head.  He reports that the impact caused him to fall to his knees and black out for approximately 20 seconds.  He also reports that he had three episodes of vomiting after being hit in the head.  He reports some dizziness after the impact, but denies dizziness at this time.  He is currently not on any anticoagulants.  He denies any vision changes at this time.  No numbness or tingling.  He has not taken anything for pain prior to arrival.  He reports history of Cervical Fusion Surgery 3-4 years ago.  Patient is a 44 y.o. male presenting with neck injury and head injury. The history is provided by the patient.  Neck Injury  Head Injury   Past Medical History  Diagnosis Date  . Type 1 diabetes mellitus     a. With insulin pump.  . Gastroparesis   . Hypercholesteremia   . S/P cardiac cath     a. 10-15 yrs ago at [facility] due to tachycardia, reportedly normal. b. Normal ETT 03/2012.  Marland Kitchen Sinus tachycardia     a. 24-hr Holter 03/2012 - SR, occ PVCs, no VT, avg HR 96bpm.  . HTN (hypertension)    Past Surgical History  Procedure Laterality Date  . Hernia repair      bilateral  . Cervical neck fusion    . Left heart catheterization with coronary angiogram N/A 12/18/2013    Procedure: LEFT HEART CATHETERIZATION WITH CORONARY ANGIOGRAM;  Surgeon: [name] M Martinique, MD;  [facility] C[facility] LAB;  Service: Cardiovascular;  Laterality: N/A;   Family History  Problem Relation Age of Onset  .  Hypertension    . Coronary artery disease      Mother's side - both her side's grandparents died of heart disease (MIs)  . Diabetes    . Stroke      Paternal grandfather (11)   History  Substance Use Topics  . Smoking status: Never Smoker   . Smokeless tobacco: Never Used  . Alcohol Use: Yes     Comment: Rare - 1 beer a month    Review of Systems  All other systems reviewed and are negative.     Allergies  Oxycontin; Prednisone; and Shellfish-derived products  Home Medications   Prior to Admission medications   Medication Sig Start Date End Date Taking? Authorizing Provider  atorvastatin (LIPITOR) 40 MG tablet Take 40 mg by mouth daily.   Yes Historical Provider, MD  calcium carbonate (TUMS - DOSED IN MG ELEMENTAL CALCIUM) 500 MG chewable tablet Chew 1 tablet by mouth 3 (three) times daily as needed for indigestion or heartburn.   Yes Historical Provider, MD  Insulin Human (INSULIN PUMP) SOLN Inject into the skin. Humalog   Yes Historical Provider, MD  lisinopril (PRINIVIL,ZESTRIL) 20 MG tablet Take 20 mg by mouth daily.    Yes Historical Provider, MD  sildenafil (VIAGRA) 100 MG tablet Take 100 mg by mouth  daily as needed for erectile dysfunction.   Yes Historical Provider, MD   BP 155/97 mmHg  Pulse 96  Temp(Src) 98.6 F (37 C) (Oral)  Resp 18  SpO2 97% Physical Exam  Constitutional: He appears well-developed and well-nourished.  HENT:  Head: Normocephalic and atraumatic.  Eyes: EOM are normal. Pupils are equal, round, and reactive to light.  Neck: Neck supple. Spinous process tenderness and muscular tenderness present.  Cervical collar in place.  Did not assess ROM  Cardiovascular: Normal rate, regular rhythm and normal heart sounds.   Pulmonary/Chest: Effort normal and breath sounds normal.  Musculoskeletal: Normal range of motion.  Neurological: He is alert. He has normal strength. No cranial nerve deficit or sensory deficit. Coordination normal.  Skin: Skin is  warm and dry.  Psychiatric: He has a normal mood and affect.  Nursing note and vitals reviewed.   ED Course  Procedures (including critical care time) Labs Review Labs Reviewed  CBG MONITORING, ED - Abnormal; Notable for the following:    Glucose-Capillary 287 (*)    All other components within normal limits    Imaging Review Ct Head Wo Contrast  12/16/2014   CLINICAL DATA:  44 year old male status post injury to the posterior head and neck sustained at work when a 40 lb box fell from a shelf striking him in the back of the head. Visual disturbance for 20 seconds. No reported loss of consciousness.  EXAM: CT HEAD WITHOUT CONTRAST  CT CERVICAL SPINE WITHOUT CONTRAST  TECHNIQUE: Multidetector CT imaging of the head and cervical spine was performed following the standard protocol without intravenous contrast. Multiplanar CT image reconstructions of the cervical spine were also generated.  COMPARISON:  Prior head CT 11/25/2012  FINDINGS: CT HEAD FINDINGS  Negative for acute intracranial hemorrhage, acute infarction, mass, mass effect, hydrocephalus or midline shift. Gray-white differentiation is preserved throughout. No focal soft tissue contusion or scalp hematoma. No calvarial fracture. Globes and orbits are intact and symmetric bilaterally. Normal aeration of the mastoid air cells and paranasal sinuses. New  CT CERVICAL SPINE FINDINGS  No acute fracture, malalignment or prevertebral soft tissue swelling. Prior surgical changes of C3-C5 anterior cervical discectomy and fusion with interbody grafts at C3-C4 and C4-C5. There is successful bony ankylosis of the segment. No evidence of hardware complication. Mild degenerative disc disease at C5-C6. Unremarkable CT appearance of the thyroid gland. No acute soft tissue abnormality. The lung apices are unremarkable.  IMPRESSION: CT HEAD  1. Negative CT CSPINE  1. No acute fracture or malalignment 2. Surgical changes of prior C3-C5 ACDF without evidence of  hardware complication.   Electronically Signed   By: Jacqulynn Cadet M.D.   On: 12/16/2014 15:24   Ct Cervical Spine Wo Contrast  12/16/2014   CLINICAL DATA:  44 year old male status post injury to the posterior head and neck sustained at work when a 40 lb box fell from a shelf striking him in the back of the head. Visual disturbance for 20 seconds. No reported loss of consciousness.  EXAM: CT HEAD WITHOUT CONTRAST  CT CERVICAL SPINE WITHOUT CONTRAST  TECHNIQUE: Multidetector CT imaging of the head and cervical spine was performed following the standard protocol without intravenous contrast. Multiplanar CT image reconstructions of the cervical spine were also generated.  COMPARISON:  Prior head CT 11/25/2012  FINDINGS: CT HEAD FINDINGS  Negative for acute intracranial hemorrhage, acute infarction, mass, mass effect, hydrocephalus or midline shift. Gray-white differentiation is preserved throughout. No focal soft tissue contusion or scalp  hematoma. No calvarial fracture. Globes and orbits are intact and symmetric bilaterally. Normal aeration of the mastoid air cells and paranasal sinuses. New  CT CERVICAL SPINE FINDINGS  No acute fracture, malalignment or prevertebral soft tissue swelling. Prior surgical changes of C3-C5 anterior cervical discectomy and fusion with interbody grafts at C3-C4 and C4-C5. There is successful bony ankylosis of the segment. No evidence of hardware complication. Mild degenerative disc disease at C5-C6. Unremarkable CT appearance of the thyroid gland. No acute soft tissue abnormality. The lung apices are unremarkable.  IMPRESSION: CT HEAD  1. Negative CT CSPINE  1. No acute fracture or malalignment 2. Surgical changes of prior C3-C5 ACDF without evidence of hardware complication.   Electronically Signed   By: Jacqulynn Cadet M.D.   On: 12/16/2014 15:24     EKG Interpretation None      MDM   Final diagnoses:  None   Patient presents today with posterior headache and  cervical spinal pain.  Pain has been present since he was at work where a 40 lb box fell off of a shelf and hit him in the posterior head and neck.  Patient reports that he blacked out for approximately 20 seconds after the injury occurred.  CT head and CT cervical spine are negative for acute findings.  Feel that the patient is stable for discharge.  Concussion instructions given to the patient.  Return precautions given.      Hyman Bible, PA-C 12/17/14 2307  Milton Ferguson, MD 12/17/14 [phone]

## 2015-01-23 ENCOUNTER — Ambulatory Visit: Payer: Self-pay

## 2015-02-10 ENCOUNTER — Other Ambulatory Visit (HOSPITAL_COMMUNITY): Payer: Self-pay | Admitting: Neurological Surgery

## 2015-02-10 DIAGNOSIS — M542 Cervicalgia: Secondary | ICD-10-CM

## 2015-02-13 ENCOUNTER — Other Ambulatory Visit: Payer: Self-pay

## 2015-02-13 ENCOUNTER — Ambulatory Visit: Payer: Self-pay

## 2015-02-13 NOTE — Patient Outreach (Signed)
[facility]) Care Management  02/13/2015  [name] 1970-11-05 [id number]  Member contacted RNCM concerning today's scheduled office visit.  States that he suffered an injury 2 months ago that injured his neck.  States he has seen the neurosurgeon who did his neck surgery 5 years ago and he is to have an MRI tomorrow.  States that his blood sugars have been elevated due to his pain.  States he is to have labs drawn on 02/25/15 and he will see Dr.Balan on 03/04/15.  Requests that Link to Wellness appointment be postponed as he is to see the endocrinologist this month. Instructed member that we can reschedule his appointment in June and he will contact to schedule in June. Instructed to try to check his blood sugars before he eats and to take his insulin bolus as directed. Plan to contact in June to schedule Link to Wellness appointment.[name] Garter RN, [facility] Care Management Coordinator-Link to [place] Management [phone]

## 2015-02-14 ENCOUNTER — Ambulatory Visit (HOSPITAL_COMMUNITY)
Admission: RE | Admit: 2015-02-14 | Discharge: 2015-02-14 | Disposition: A | Discharge status: Home / Self Care | Payer: PRIVATE HEALTH INSURANCE | Source: Ambulatory Visit | Attending: Neurological Surgery | Admitting: Neurological Surgery

## 2015-02-14 DIAGNOSIS — M25511 Pain in right shoulder: Secondary | ICD-10-CM | POA: Insufficient documentation

## 2015-02-14 DIAGNOSIS — M542 Cervicalgia: Secondary | ICD-10-CM | POA: Insufficient documentation

## 2015-02-14 DIAGNOSIS — M4802 Spinal stenosis, cervical region: Secondary | ICD-10-CM | POA: Diagnosis not present

## 2015-02-14 DIAGNOSIS — Z981 Arthrodesis status: Secondary | ICD-10-CM | POA: Diagnosis not present

## 2015-02-14 DIAGNOSIS — M5023 Other cervical disc displacement, cervicothoracic region: Secondary | ICD-10-CM | POA: Diagnosis not present

## 2015-02-14 DIAGNOSIS — M129 Arthropathy, unspecified: Secondary | ICD-10-CM | POA: Diagnosis not present

## 2015-06-05 ENCOUNTER — Other Ambulatory Visit: Payer: Self-pay

## 2015-06-05 VITALS — BP 148/94 | HR 98 | Resp 14 | Ht 72.0 in | Wt 246.0 lb

## 2015-06-05 DIAGNOSIS — E109 Type 1 diabetes mellitus without complications: Secondary | ICD-10-CM

## 2015-06-05 NOTE — Patient Outreach (Signed)
[facility]) Care Management   06/05/2015  [name] September 24, 1971 [id number]  [name] is an 44 y.o. male.   Member seen for follow up office visit for Link to Wellness program for self management of Type 1 diabetes  Subjective: Member states he is still having pain in his neck after an injury.  States that he has had several injections in his neck but it has not helped.  States that his blood sugars have been high due to the steroid injections and stress.  States that he had his labs drawn last month and his hemoglobin A1C was 9.4.  States he is to see Dr.Balan on 07/28/15.  States he has not been able to exercise much due to his neck pain.  States that he knows he has not been eating right and he eats large portions.    Objective:   Review of Systems  Musculoskeletal: Positive for neck pain.    Physical Exam  Today's Vitals   06/05/15 1605 06/05/15 1614  BP: 148/94   Pulse: 98   Resp: 14   Height: 1.829 m (6')   Weight: 246 lb (111.585 kg)   SpO2: 98%   PainSc: 6  6     Current Medications:   Current Outpatient Prescriptions  Medication Sig Dispense Refill  . aspirin EC 81 MG tablet Take 81 mg by mouth daily.    Marland Kitchen atorvastatin (LIPITOR) 40 MG tablet Take 40 mg by mouth daily.    . calcium carbonate (TUMS - DOSED IN MG ELEMENTAL CALCIUM) 500 MG chewable tablet Chew 1 tablet by mouth 3 (three) times daily as needed for indigestion or heartburn.    Marland Kitchen ibuprofen (ADVIL,MOTRIN) 200 MG tablet Take 600 mg by mouth every 6 (six) hours as needed. 3 tablets as needed    . Insulin Human (INSULIN PUMP) SOLN Inject into the skin. Humalog    . lisinopril (PRINIVIL,ZESTRIL) 20 MG tablet Take 20 mg by mouth daily.     Marland Kitchen oxyCODONE-acetaminophen (PERCOCET/ROXICET) 5-325 MG per tablet Take 1-2 tablets by mouth every 6 (six) hours as needed for severe pain. 15 tablet 0  . sildenafil (VIAGRA) 100 MG tablet Take 100 mg by mouth daily as needed for erectile  dysfunction.    . ondansetron (ZOFRAN) 4 MG tablet Take 1 tablet (4 mg total) by mouth every 6 (six) hours. (Patient not taking: Reported on 06/05/2015) 12 tablet 0   No current facility-administered medications for this visit.    Functional Status:   In your present state of health, do you have any difficulty performing the following activities: 06/05/2015  Hearing? N  Vision? N  Difficulty concentrating or making decisions? N  Walking or climbing stairs? N  Dressing or bathing? N  Doing errands, shopping? N    Fall/Depression Screening:    PHQ 2/9 Scores 06/05/2015  PHQ - 2 Score 1   THN CM Care Plan Problem One        Most Recent Value   Care Plan Problem One  Elevated blood sugars related to dx of Type 1 DM   Role Documenting the Problem One  Care Management [place] for Problem One  Active   THN Long Term Goal (31-90 days)  Member will decrease hemoglobin A1C by one point within the next 90 days   THN Long Term Goal Start Date  06/05/15   Interventions for Problem One Long Term Goal  Reviewed CHO counting and the importance of  watching portion sizes, Instructed to make an appointment with [name] to help him with his pump settings  and notified her of visit with RNCM, Reinforced importance of exercise to help with pain control and glycemic control, Discussed effect stress and pain can have on blood sugars, encouraged to resume wearing his continuous glucose monitor      Assessment:   Member seen for follow up office visit for Link to Wellness program for self management of Type 1 diabetes.  Member is not well controlled with last Hemoglobin A1C of 9.4.  Member has had higher CBGs with steroid injections.  Member has not been to see RD/CDE to adjust his insulin pump settings. He has not been watching his diet closely and he has not been wearing his continuous glucose monitor.  He has been less active with neck injury Plan:  Plan to see [name] at [place].  Call to schedule.  [phone] Plan to check blood sugar before eating and remember to bolus insulin. Plan to watch portion sizes of chips Plan to continue walking 10-15 minutes 5 days a week  Plan to schedule dental appointment Plan to keep appointment October 24 Plan to see Norm Parcel to Wellness August 21, 2015  [name] Garter RN, [facility] Care Management Coordinator-Link to [place] Management [phone]

## 2015-06-06 NOTE — Patient Instructions (Signed)
1. Plan to see [name] at [facility].  Call to schedule.  [phone] 2. Plan to check blood sugar before eating and remember to bolus insulin. Plan to watch portion sizes of chips 3. Plan to continue walking 10-15 minutes 5 days a week  4. Plan to schedule dental appointment 5. Plan to keep appointment October 24 6. Plan to see Link to Wellness August 21, 2015 at [facility]

## 2015-08-21 ENCOUNTER — Ambulatory Visit: Payer: Self-pay

## 2015-09-07 ENCOUNTER — Emergency Department (HOSPITAL_COMMUNITY): Payer: 59

## 2015-09-07 ENCOUNTER — Inpatient Hospital Stay (HOSPITAL_COMMUNITY)
Admission: EM | Admit: 2015-09-07 | Discharge: 2015-09-09 | DRG: 872 | Disposition: A | Discharge status: Home / Self Care | Payer: 59 | Attending: Internal Medicine | Admitting: Internal Medicine

## 2015-09-07 ENCOUNTER — Encounter (HOSPITAL_COMMUNITY): Payer: Self-pay | Admitting: *Deleted

## 2015-09-07 DIAGNOSIS — N179 Acute kidney failure, unspecified: Secondary | ICD-10-CM | POA: Diagnosis present

## 2015-09-07 DIAGNOSIS — I129 Hypertensive chronic kidney disease with stage 1 through stage 4 chronic kidney disease, or unspecified chronic kidney disease: Secondary | ICD-10-CM | POA: Diagnosis present

## 2015-09-07 DIAGNOSIS — M79606 Pain in leg, unspecified: Secondary | ICD-10-CM | POA: Insufficient documentation

## 2015-09-07 DIAGNOSIS — N189 Chronic kidney disease, unspecified: Secondary | ICD-10-CM

## 2015-09-07 DIAGNOSIS — Z79899 Other long term (current) drug therapy: Secondary | ICD-10-CM | POA: Diagnosis not present

## 2015-09-07 DIAGNOSIS — Z7982 Long term (current) use of aspirin: Secondary | ICD-10-CM

## 2015-09-07 DIAGNOSIS — L03115 Cellulitis of right lower limb: Secondary | ICD-10-CM | POA: Diagnosis not present

## 2015-09-07 DIAGNOSIS — E669 Obesity, unspecified: Secondary | ICD-10-CM | POA: Diagnosis present

## 2015-09-07 DIAGNOSIS — Z888 Allergy status to other drugs, medicaments and biological substances status: Secondary | ICD-10-CM | POA: Diagnosis not present

## 2015-09-07 DIAGNOSIS — E119 Type 2 diabetes mellitus without complications: Secondary | ICD-10-CM | POA: Diagnosis not present

## 2015-09-07 DIAGNOSIS — Z6832 Body mass index (BMI) 32.0-32.9, adult: Secondary | ICD-10-CM

## 2015-09-07 DIAGNOSIS — E860 Dehydration: Secondary | ICD-10-CM | POA: Diagnosis present

## 2015-09-07 DIAGNOSIS — Z885 Allergy status to narcotic agent status: Secondary | ICD-10-CM

## 2015-09-07 DIAGNOSIS — M5020 Other cervical disc displacement, unspecified cervical region: Secondary | ICD-10-CM | POA: Diagnosis not present

## 2015-09-07 DIAGNOSIS — Z823 Family history of stroke: Secondary | ICD-10-CM | POA: Diagnosis not present

## 2015-09-07 DIAGNOSIS — N182 Chronic kidney disease, stage 2 (mild): Secondary | ICD-10-CM | POA: Diagnosis present

## 2015-09-07 DIAGNOSIS — Z981 Arthrodesis status: Secondary | ICD-10-CM

## 2015-09-07 DIAGNOSIS — Z9641 Presence of insulin pump (external) (internal): Secondary | ICD-10-CM | POA: Diagnosis present

## 2015-09-07 DIAGNOSIS — Z833 Family history of diabetes mellitus: Secondary | ICD-10-CM | POA: Diagnosis not present

## 2015-09-07 DIAGNOSIS — E7800 Pure hypercholesterolemia, unspecified: Secondary | ICD-10-CM | POA: Diagnosis present

## 2015-09-07 DIAGNOSIS — M79604 Pain in right leg: Secondary | ICD-10-CM | POA: Diagnosis present

## 2015-09-07 DIAGNOSIS — R6510 Systemic inflammatory response syndrome (SIRS) of non-infectious origin without acute organ dysfunction: Secondary | ICD-10-CM | POA: Diagnosis present

## 2015-09-07 DIAGNOSIS — A419 Sepsis, unspecified organism: Principal | ICD-10-CM | POA: Diagnosis present

## 2015-09-07 DIAGNOSIS — Z91013 Allergy to seafood: Secondary | ICD-10-CM

## 2015-09-07 DIAGNOSIS — E1065 Type 1 diabetes mellitus with hyperglycemia: Secondary | ICD-10-CM | POA: Diagnosis present

## 2015-09-07 DIAGNOSIS — E1022 Type 1 diabetes mellitus with diabetic chronic kidney disease: Secondary | ICD-10-CM | POA: Diagnosis present

## 2015-09-07 DIAGNOSIS — E109 Type 1 diabetes mellitus without complications: Secondary | ICD-10-CM

## 2015-09-07 DIAGNOSIS — Z794 Long term (current) use of insulin: Secondary | ICD-10-CM | POA: Diagnosis not present

## 2015-09-07 DIAGNOSIS — E785 Hyperlipidemia, unspecified: Secondary | ICD-10-CM | POA: Diagnosis present

## 2015-09-07 DIAGNOSIS — I10 Essential (primary) hypertension: Secondary | ICD-10-CM | POA: Diagnosis present

## 2015-09-07 DIAGNOSIS — Z8249 Family history of ischemic heart disease and other diseases of the circulatory system: Secondary | ICD-10-CM | POA: Diagnosis not present

## 2015-09-07 DIAGNOSIS — E871 Hypo-osmolality and hyponatremia: Secondary | ICD-10-CM | POA: Diagnosis present

## 2015-09-07 HISTORY — DX: Chronic kidney disease, stage 2 (mild): N18.2

## 2015-09-07 HISTORY — DX: Other cervical disc displacement, unspecified cervical region: M50.20

## 2015-09-07 LAB — CBC WITH DIFFERENTIAL/PLATELET
Basophils Absolute: 0.1 10*3/uL (ref 0.0–0.1)
Basophils Relative: 1 %
Eosinophils Absolute: 0.2 10*3/uL (ref 0.0–0.7)
Eosinophils Relative: 2 %
HCT: 34.1 % — ABNORMAL LOW (ref 39.0–52.0)
Hemoglobin: 11.6 g/dL — ABNORMAL LOW (ref 13.0–17.0)
Lymphocytes Relative: 17 %
Lymphs Abs: 1.7 10*3/uL (ref 0.7–4.0)
MCH: 29.4 pg (ref 26.0–34.0)
MCHC: 34 g/dL (ref 30.0–36.0)
MCV: 86.3 fL (ref 78.0–100.0)
Monocytes Absolute: 1.4 10*3/uL — ABNORMAL HIGH (ref 0.1–1.0)
Monocytes Relative: 14 %
Neutro Abs: 6.7 10*3/uL (ref 1.7–7.7)
Neutrophils Relative %: 66 %
Platelets: 279 10*3/uL (ref 150–400)
RBC: 3.95 MIL/uL — ABNORMAL LOW (ref 4.22–5.81)
RDW: 12.9 % (ref 11.5–15.5)
WBC: 10 10*3/uL (ref 4.0–10.5)

## 2015-09-07 LAB — COMPREHENSIVE METABOLIC PANEL
ALT: 20 U/L (ref 17–63)
AST: 22 U/L (ref 15–41)
Albumin: 3.7 g/dL (ref 3.5–5.0)
Alkaline Phosphatase: 88 U/L (ref 38–126)
Anion gap: 11 (ref 5–15)
BUN: 24 mg/dL — ABNORMAL HIGH (ref 6–20)
CO2: 23 mmol/L (ref 22–32)
Calcium: 9.1 mg/dL (ref 8.9–10.3)
Chloride: 98 mmol/L — ABNORMAL LOW (ref 101–111)
Creatinine, Ser: 1.34 mg/dL — ABNORMAL HIGH (ref 0.61–1.24)
GFR calc Af Amer: >60 mL/min (ref >60)
GFR calc non Af Amer: >60 mL/min (ref >60)
Glucose, Bld: 351 mg/dL — ABNORMAL HIGH (ref 65–99)
Potassium: 4.5 mmol/L (ref 3.5–5.1)
Sodium: 132 mmol/L — ABNORMAL LOW (ref 135–145)
Total Bilirubin: 0.8 mg/dL (ref 0.3–1.2)
Total Protein: 7.7 g/dL (ref 6.5–8.1)

## 2015-09-07 LAB — URINE MICROSCOPIC-ADD ON

## 2015-09-07 LAB — URINALYSIS, ROUTINE W REFLEX MICROSCOPIC
Bilirubin Urine: NEGATIVE
Glucose, UA: >1000 mg/dL — AB
Ketones, ur: NEGATIVE mg/dL
Leukocytes, UA: NEGATIVE
Nitrite: NEGATIVE
Protein, ur: 100 mg/dL — AB
Specific Gravity, Urine: 1.024 (ref 1.005–1.030)
pH: 5 (ref 5.0–8.0)

## 2015-09-07 LAB — I-STAT CG4 LACTIC ACID, ED: Lactic Acid, Venous: 1.9 mmol/L (ref 0.5–2.0)

## 2015-09-07 LAB — CBG MONITORING, ED: Glucose-Capillary: 374 mg/dL — ABNORMAL HIGH (ref 65–99)

## 2015-09-07 LAB — PROTIME-INR
INR: 1.07 (ref 0.00–1.49)
Prothrombin Time: 14.1 seconds (ref 11.6–15.2)

## 2015-09-07 LAB — LACTIC ACID, PLASMA: Lactic Acid, Venous: 3.4 mmol/L (ref 0.5–2.0)

## 2015-09-07 LAB — PROCALCITONIN: Procalcitonin: 0.31 ng/mL

## 2015-09-07 LAB — APTT: aPTT: 24 seconds (ref 24–37)

## 2015-09-07 LAB — GLUCOSE, CAPILLARY: Glucose-Capillary: 206 mg/dL — ABNORMAL HIGH (ref 65–99)

## 2015-09-07 LAB — CK: Total CK: 276 U/L (ref 49–397)

## 2015-09-07 MED ORDER — VANCOMYCIN HCL IN DEXTROSE 1-5 GM/200ML-% IV SOLN
1000.0000 mg | Freq: Once | INTRAVENOUS | Status: AC
Start: 2015-09-07 — End: 2015-09-07
  Administered 2015-09-07: 1000 mg via INTRAVENOUS
  Filled 2015-09-07: qty 200

## 2015-09-07 MED ORDER — HYDRALAZINE HCL 20 MG/ML IJ SOLN: 5.0000 mg | INTRAMUSCULAR | Status: DC | PRN

## 2015-09-07 MED ORDER — HEPARIN SODIUM (PORCINE) 5000 UNIT/ML IJ SOLN
5000.0000 [IU] | Freq: Three times a day (TID) | INTRAMUSCULAR | Status: DC
  Administered 2015-09-08 – 2015-09-09 (×4): 5000 [IU] via SUBCUTANEOUS
  Filled 2015-09-07 (×5): qty 1

## 2015-09-07 MED ORDER — ATORVASTATIN CALCIUM 40 MG PO TABS
40.0000 mg | ORAL_TABLET | Freq: Every day | ORAL | Status: DC
  Administered 2015-09-08: 40 mg via ORAL
  Filled 2015-09-07: qty 1

## 2015-09-07 MED ORDER — ASPIRIN EC 81 MG PO TBEC
81.0000 mg | DELAYED_RELEASE_TABLET | Freq: Every day | ORAL | Status: DC
  Administered 2015-09-08 – 2015-09-09 (×2): 81 mg via ORAL
  Filled 2015-09-07 (×2): qty 1

## 2015-09-07 MED ORDER — MORPHINE SULFATE (PF) 4 MG/ML IV SOLN
4.0000 mg | Freq: Once | INTRAVENOUS | Status: AC
  Administered 2015-09-07: 4 mg via INTRAVENOUS
  Filled 2015-09-07: qty 1

## 2015-09-07 MED ORDER — SODIUM CHLORIDE 0.9 % IV BOLUS (SEPSIS)
1000.0000 mL | Freq: Once | INTRAVENOUS | Status: AC
  Administered 2015-09-07: 1000 mL via INTRAVENOUS

## 2015-09-07 MED ORDER — SODIUM CHLORIDE 0.9 % IV SOLN
INTRAVENOUS | Status: DC
  Administered 2015-09-08 (×2): via INTRAVENOUS

## 2015-09-07 MED ORDER — OXYCODONE-ACETAMINOPHEN 5-325 MG PO TABS
1.0000 | ORAL_TABLET | Freq: Four times a day (QID) | ORAL | Status: DC | PRN
  Administered 2015-09-08 – 2015-09-09 (×2): 2 via ORAL
  Filled 2015-09-07 (×2): qty 2

## 2015-09-07 MED ORDER — ACETAMINOPHEN 325 MG PO TABS
650.0000 mg | ORAL_TABLET | Freq: Four times a day (QID) | ORAL | Status: DC | PRN
  Administered 2015-09-08: 650 mg via ORAL
  Filled 2015-09-07 (×2): qty 2

## 2015-09-07 MED ORDER — VANCOMYCIN HCL IN DEXTROSE 750-5 MG/150ML-% IV SOLN
750.0000 mg | Freq: Two times a day (BID) | INTRAVENOUS | Status: DC
  Administered 2015-09-08 – 2015-09-09 (×3): 750 mg via INTRAVENOUS
  Filled 2015-09-07 (×4): qty 150

## 2015-09-07 MED ORDER — PIPERACILLIN-TAZOBACTAM 3.375 G IVPB
3.3750 g | Freq: Three times a day (TID) | INTRAVENOUS | Status: DC
  Administered 2015-09-08 – 2015-09-09 (×4): 3.375 g via INTRAVENOUS
  Filled 2015-09-07 (×4): qty 50

## 2015-09-07 MED ORDER — SODIUM CHLORIDE 0.9 % IJ SOLN
3.0000 mL | Freq: Two times a day (BID) | INTRAMUSCULAR | Status: DC
  Administered 2015-09-08: 3 mL via INTRAVENOUS

## 2015-09-07 MED ORDER — SODIUM CHLORIDE 0.9 % IV BOLUS (SEPSIS)
2000.0000 mL | Freq: Once | INTRAVENOUS | Status: AC
  Administered 2015-09-07: 2000 mL via INTRAVENOUS

## 2015-09-07 MED ORDER — MORPHINE SULFATE (PF) 2 MG/ML IV SOLN
2.0000 mg | INTRAVENOUS | Status: DC | PRN
  Administered 2015-09-07 – 2015-09-09 (×4): 2 mg via INTRAVENOUS
  Filled 2015-09-07 (×4): qty 1

## 2015-09-07 MED ORDER — ACETAMINOPHEN 650 MG RE SUPP: 650.0000 mg | Freq: Four times a day (QID) | RECTAL | Status: DC | PRN

## 2015-09-07 MED ORDER — CALCIUM CARBONATE ANTACID 500 MG PO CHEW: 1.0000 | CHEWABLE_TABLET | Freq: Three times a day (TID) | ORAL | Status: DC | PRN

## 2015-09-07 MED ORDER — INSULIN PUMP
Freq: Three times a day (TID) | SUBCUTANEOUS | Status: DC
  Administered 2015-09-08: 13.5 via SUBCUTANEOUS
  Administered 2015-09-08 (×3): via SUBCUTANEOUS
  Administered 2015-09-08: 7.6 via SUBCUTANEOUS
  Administered 2015-09-09 (×3): via SUBCUTANEOUS
  Filled 2015-09-07: qty 1

## 2015-09-07 MED ORDER — VANCOMYCIN HCL IN DEXTROSE 1-5 GM/200ML-% IV SOLN
1000.0000 mg | Freq: Once | INTRAVENOUS | Status: AC
  Administered 2015-09-07: 1000 mg via INTRAVENOUS
  Filled 2015-09-07: qty 200

## 2015-09-07 MED ORDER — HYDROMORPHONE HCL 1 MG/ML IJ SOLN
1.0000 mg | Freq: Once | INTRAMUSCULAR | Status: AC
  Administered 2015-09-07: 1 mg via INTRAVENOUS
  Filled 2015-09-07: qty 1

## 2015-09-07 MED ORDER — PIPERACILLIN-TAZOBACTAM 3.375 G IVPB 30 MIN
3.3750 g | Freq: Once | INTRAVENOUS | Status: AC
  Administered 2015-09-07: 3.375 g via INTRAVENOUS
  Filled 2015-09-07: qty 50

## 2015-09-07 MED ORDER — ONDANSETRON HCL 4 MG/2ML IJ SOLN
4.0000 mg | Freq: Three times a day (TID) | INTRAMUSCULAR | Status: DC | PRN
  Administered 2015-09-08 (×2): 4 mg via INTRAVENOUS
  Filled 2015-09-07 (×2): qty 2

## 2015-09-07 MED ORDER — SODIUM CHLORIDE 0.9 % IV SOLN: INTRAVENOUS | Status: DC

## 2015-09-07 MED ORDER — ALUM & MAG HYDROXIDE-SIMETH 200-200-20 MG/5ML PO SUSP: 30.0000 mL | Freq: Four times a day (QID) | ORAL | Status: DC | PRN

## 2015-09-07 MED ORDER — ONDANSETRON HCL 4 MG/2ML IJ SOLN
4.0000 mg | Freq: Once | INTRAMUSCULAR | Status: AC
  Administered 2015-09-07: 4 mg via INTRAVENOUS
  Filled 2015-09-07: qty 2

## 2015-09-07 NOTE — ED Notes (Signed)
Pt has had urinal at bedside since returning back from radiology. Encouraged patient to urinate when hospitalist leaves the room.

## 2015-09-07 NOTE — H&P (Signed)
[facility] History and Physical  [name] [id number] DOB: 07-Jan-1971 DOA: 09/07/2015  Referring physician: ED physician PCP: Nicoletta Dress, MD  Specialists:   Chief Complaint: Right leg pain, swelling, fever  HPI: [name] is a 44 y.o. male with PMH of type I diabetes mellitus on insulin Pump, hypertension, hyperlipidemia, CKD-2, herniated cervical spine disc, who presents with right leg pain, swelling and fever.  Pt reports that he started having R lower leg pain and swelling yesterday, pain is extending to the bottom of R foot below the second toe. He also noticed redness in R lower leg. The pain is severe, sharp, constant. He has subjective fever and chills. He has nausea, but no vomiting, abdominal pain or diarrhea. Patient denies chest pain, shortness of breath, symptoms of UTI or unilateral weakness. He  States that he had an ablation to his neck for herniated cervical disc on 08/26/15. His blood pressure is elevated.   In ED, patient was found to have WBC 10.0, lactate 1.90, temperature 99.2, tachycardia, respiration rate 20, worsening renal function, pending urinalysis. X-ray of right foot showed distal soft tissue swelling without acute underlying bony abnormality, and old, healed first distal phalanx fracture. Patient is admitted to inpatient for further intervention and treatment.  Where does patient live?   At home   Can patient participate in ADLs?  Yes Review of Systems:   General: has fevers, chills, no changes in body weight, has poor appetite, has fatigue HEENT: no blurry vision, hearing changes or sore throat Pulm: no dyspnea, coughing, wheezing CV: no chest pain, palpitations Abd: has nausea, no vomiting, abdominal pain, diarrhea, constipation GU: no dysuria, burning on urination, increased urinary frequency, hematuria  Ext: has R leg edema and pain. Neuro: no unilateral weakness, numbness, or tingling, no vision change or hearing  loss Skin: no rash MSK: No muscle spasm, no deformity, no limitation of range of movement in spin Heme: No easy bruising.  Travel history: No recent long distant travel.  Allergy:  Allergies  Allergen Reactions  . Oxycontin [Oxycodone Hcl] Nausea And Vomiting  . Prednisone Other (See Comments)    Patient is diabetic, runs sugar up  . Shellfish-Derived Products     Pt is not allergic to iodine, has had iodine in the past w/o premeds and w/ no problems    Past Medical History  Diagnosis Date  . Type 1 diabetes mellitus ([place])     a. With insulin pump.  . Gastroparesis   . Hypercholesteremia   . S/P cardiac cath     a. 10-15 yrs ago at [facility] due to tachycardia, reportedly normal. b. Normal ETT 03/2012.  Marland Kitchen Sinus tachycardia (HCC)     a. 24-hr Holter 03/2012 - SR, occ PVCs, no VT, avg HR 96bpm.  . HTN (hypertension)   . CKD (chronic kidney disease), stage II   . Herniated cervical disc     Past Surgical History  Procedure Laterality Date  . Hernia repair      bilateral  . Cervical neck fusion    . Left heart catheterization with coronary angiogram N/A 12/18/2013    Procedure: LEFT HEART CATHETERIZATION WITH CORONARY ANGIOGRAM;  Surgeon: [name] M Martinique, MD;  Location: [facility] CATH LAB;  Service: Cardiovascular;  Laterality: N/A;    Social History:  reports that he has never smoked. He has never used smokeless tobacco. He reports that he drinks alcohol. He reports that he does not use illicit drugs.  Family History:  Family History  Problem Relation Age of Onset  . Hypertension    . Coronary artery disease      Mother's side - both her side's grandparents died of heart disease (MIs)  . Diabetes    . Stroke      Paternal grandfather (38)     Prior to Admission medications   Medication Sig Start Date End Date Taking? Authorizing Provider  aspirin EC 81 MG tablet Take 81 mg by mouth daily.   Yes Historical Provider, MD  atorvastatin (LIPITOR) 40 MG tablet Take 40 mg by mouth daily.    Yes Historical Provider, MD  calcium carbonate (TUMS - DOSED IN MG ELEMENTAL CALCIUM) 500 MG chewable tablet Chew 1 tablet by mouth 3 (three) times daily as needed for indigestion or heartburn.   Yes Historical Provider, MD  ibuprofen (ADVIL,MOTRIN) 200 MG tablet Take 600 mg by mouth every 6 (six) hours as needed for headache, mild pain or moderate pain. 3 tablets as needed   Yes Historical Provider, MD  Insulin Human (INSULIN PUMP) SOLN by Continuous infusion (non-IV) route. Humalog   Yes Historical Provider, MD  lisinopril (PRINIVIL,ZESTRIL) 20 MG tablet Take 20 mg by mouth daily.    Yes Historical Provider, MD  oxyCODONE-acetaminophen (PERCOCET/ROXICET) 5-325 MG per tablet Take 1-2 tablets by mouth every 6 (six) hours as needed for severe pain. 12/16/14  Yes Heather Laisure, PA-C  sildenafil (VIAGRA) 100 MG tablet Take 100 mg by mouth daily as needed for erectile dysfunction.   Yes Historical Provider, MD  ondansetron (ZOFRAN) 4 MG tablet Take 1 tablet (4 mg total) by mouth every 6 (six) hours. Patient not taking: Reported on 06/05/2015 12/16/14   Hyman Bible, PA-C    Physical Exam: Filed Vitals:   09/07/15 1943 09/07/15 2108 09/07/15 2203  BP: 167/92  146/95  Pulse: 112  102  Temp: 99.2 F (37.3 C)  99 F (37.2 C)  TempSrc: Oral  Oral  Resp: 20  20  Height:  6' (1.829 m)   Weight:  108.863 kg (240 lb)   SpO2: 98%  100%   General: Not in acute distress HEENT:       Eyes: PERRL, EOMI, no scleral icterus.       ENT: No discharge from the ears and nose, no pharynx injection, no tonsillar enlargement.        Neck: No JVD, no bruit, no mass felt. Heme: No neck lymph node enlargement. Cardiac: S1/S2, RRR, tachycardia, No murmurs, No gallops or rubs. Pulm: No rales, wheezing, rhonchi or rubs. Abd: Soft, nondistended, nontender, no rebound pain, no organomegaly, BS present. Ext: 2+DP/PT pulse bilaterally. There is redness, tenderness and warmth over right lower leg and plantar area in  the right foot. Tenderness also involves calf area. Musculoskeletal: No joint deformities, No joint redness or warmth, no limitation of ROM in spin. Skin: No rashes.  Neuro: Alert, oriented X3, cranial nerves II-XII grossly intact, muscle strength 5/5 in all extremities. Psych: Patient is not psychotic, no suicidal or hemocidal ideation.  Labs on Admission:  Basic Metabolic Panel:  Recent Labs Lab 09/07/15 2046  NA 132*  K 4.5  CL 98*  CO2 23  GLUCOSE 351*  BUN 24*  CREATININE 1.34*  CALCIUM 9.1   Liver Function Tests:  Recent Labs Lab 09/07/15 2046  AST 22  ALT 20  ALKPHOS 88  BILITOT 0.8  PROT 7.7  ALBUMIN 3.7   No results for input(s): LIPASE, AMYLASE in the last 168 hours. No results for input(s):  AMMONIA in the last 168 hours. CBC:  Recent Labs Lab 09/07/15 2046  WBC 10.0  NEUTROABS 6.7  HGB 11.6*  HCT 34.1*  MCV 86.3  PLT 279   Cardiac Enzymes: No results for input(s): CKTOTAL, CKMB, CKMBINDEX, TROPONINI in the last 168 hours.  BNP (last 3 results) No results for input(s): BNP in the last 8760 hours.  ProBNP (last 3 results) No results for input(s): PROBNP in the last 8760 hours.  CBG:  Recent Labs Lab 09/07/15 1946  GLUCAP 374*    Radiological Exams on Admission: Dg Foot Complete Right  09/07/2015  CLINICAL DATA:  Severe plantar right foot pain since yesterday morning. No known injury. EXAM: RIGHT FOOT COMPLETE - 3+ VIEW COMPARISON:  None. FINDINGS: Distal soft tissue swelling. There is also deformity of the lateral aspect of the base of the first distal phalanx, involving the first IP joint. There is also mild dorsal tarsal spur formation. IMPRESSION: 1. Distal soft tissue swelling without acute underlying bony abnormality. 2. Old, healed first distal phalanx fracture, as described above. Electronically Signed   By: Claudie Revering M.D.   On: 09/07/2015 21:25    EKG: Independently reviewed. QTC 438, tachycardia, no ischemic  change   Assessment/Plan Principal Problem:   Cellulitis of leg, right Active Problems:   IDDM (insulin dependent diabetes mellitus) (HCC)   Hypercholesteremia   HTN (hypertension)   Sepsis (HCC)   Acute on chronic kidney failure (HCC)   Herniated cervical disc  Cellulitis of R lower leg and sepsis: Patient's tenderness, swelling, redness and warmth in R lower leg are consistent with cellulitis. Patient meets criteria for sepsis on admission with tachycardia and RR=20. He is hemodynamically stable. Another DD is DVT, which needs to be ruled out.  - will admit to tele bed - Empiric antimicrobial treatment with vancomycin and Zosyn per pharmacy - PRN Zofran for nausea, morphine and Percocet for pain - Blood cultures x 2  - will get Procalcitonin and trend lactic acid levels per sepsis protocol. - IVF: 3.0 NS bolus, followed by 75 cc/h  - LE doppler to r/o DVT  DM-I: On insulin pump. Last A1c10.4 , poorly controled. Blood sugar is elevated at 351, which is likely due to ongoing infection. -will continue insulin pump and consult to diabetic educator -IV fluids as above  HLD: Last LDL was 60 on 12/18/13 -Continue home medications: Lipitor  HTN (hypertension): -Hold lisinopril due to worsening renal function. -IV hydralazine.  AoCKD-II: Baseline Cre is 1.1-1.2, his Cre is 1.34 on admission. Likely due to prerenal secondary to dehydration and continuation of ACEI and NSAIDs. - IVF as above - Check FeNa  - Follow up renal function by BMP - Hold lisinopril and ibuprofen   Herniated cervical disc: s/p ablation and cervical fusion. -When necessary Percocet  DVT ppx: SQ Heparin     Code Status: Full code Family Communication:  Yes, patient's wife at bed side Disposition Plan: Admit to inpatient   Date of Service 09/07/2015    Ivor Costa [facility] Pager [phone]  If 7PM-7AM, please contact night-coverage [email or web address] Password [email or web address] 09/07/2015, 10:45 PM

## 2015-09-07 NOTE — ED Provider Notes (Addendum)
CSN: [id number]     Arrival date & time 09/07/15  1937 History   First MD Initiated Contact with Patient 09/07/15 2018     Chief Complaint  Patient presents with  . Leg Pain     (Consider location/radiation/quality/duration/timing/severity/associated sxs/prior Treatment) Patient is a 44 y.o. male presenting with leg pain. The history is provided by the patient.  Leg Pain Associated symptoms: no back pain and no fever   Patient w hx iddm, c/o acute onset right heel/foot, lower leg pain since esterday. States worse w standing on foot. Pain shoots upward. Mod-severe. No hx same pain. Denies injury or fall. No fevers, or  sweats. + chills.  States blood sugars have been up. No skin lesions. States feels swollen. No hx dvt or pe.       Past Medical History  Diagnosis Date  . Type 1 diabetes mellitus ([place])     a. With insulin pump.  . Gastroparesis   . Hypercholesteremia   . S/P cardiac cath     a. 10-15 yrs ago at [facility] due to tachycardia, reportedly normal. b. Normal ETT 03/2012.  Marland Kitchen Sinus tachycardia (HCC)     a. 24-hr Holter 03/2012 - SR, occ PVCs, no VT, avg HR 96bpm.  . HTN (hypertension)    Past Surgical History  Procedure Laterality Date  . Hernia repair      bilateral  . Cervical neck fusion    . Left heart catheterization with coronary angiogram N/A 12/18/2013    Procedure: LEFT HEART CATHETERIZATION WITH CORONARY ANGIOGRAM;  Surgeon: [name] M Martinique, MD;  Location: [facility] CATH LAB;  Service: Cardiovascular;  Laterality: N/A;   Family History  Problem Relation Age of Onset  . Hypertension    . Coronary artery disease      Mother's side - both her side's grandparents died of heart disease (MIs)  . Diabetes    . Stroke      Paternal grandfather (41)   Social History  Substance Use Topics  . Smoking status: Never Smoker   . Smokeless tobacco: Never Used  . Alcohol Use: Yes     Comment: Rare - 1 beer a month    Review of Systems  Constitutional: Negative for fever and  chills.  HENT: Negative for sore throat.   Eyes: Negative for redness.  Respiratory: Negative for shortness of breath.   Cardiovascular: Negative for chest pain.  Gastrointestinal: Negative for vomiting and abdominal pain.  Genitourinary: Negative for flank pain.  Musculoskeletal: Negative for back pain.  Skin: Negative for rash.  Neurological: Negative for headaches.  Hematological: Does not bruise/bleed easily.  Psychiatric/Behavioral: Negative for confusion.      Allergies  Oxycontin; Prednisone; and Shellfish-derived products  Home Medications   Prior to Admission medications   Medication Sig Start Date End Date Taking? Authorizing Provider  aspirin EC 81 MG tablet Take 81 mg by mouth daily.    Historical Provider, MD  atorvastatin (LIPITOR) 40 MG tablet Take 40 mg by mouth daily.    Historical Provider, MD  calcium carbonate (TUMS - DOSED IN MG ELEMENTAL CALCIUM) 500 MG chewable tablet Chew 1 tablet by mouth 3 (three) times daily as needed for indigestion or heartburn.    Historical Provider, MD  ibuprofen (ADVIL,MOTRIN) 200 MG tablet Take 600 mg by mouth every 6 (six) hours as needed. 3 tablets as needed    Historical Provider, MD  Insulin Human (INSULIN PUMP) SOLN Inject into the skin. Humalog    Historical Provider, MD  lisinopril (PRINIVIL,ZESTRIL) 20 MG tablet Take 20 mg by mouth daily.     Historical Provider, MD  ondansetron (ZOFRAN) 4 MG tablet Take 1 tablet (4 mg total) by mouth every 6 (six) hours. Patient not taking: Reported on 06/05/2015 12/16/14   Hyman Bible, PA-C  oxyCODONE-acetaminophen (PERCOCET/ROXICET) 5-325 MG per tablet Take 1-2 tablets by mouth every 6 (six) hours as needed for severe pain. 12/16/14   Heather Laisure, PA-C  sildenafil (VIAGRA) 100 MG tablet Take 100 mg by mouth daily as needed for erectile dysfunction.    Historical Provider, MD   BP 167/92 mmHg  Pulse 112  Temp(Src) 99.2 F (37.3 C) (Oral)  Resp 20  SpO2 98% Physical Exam   Constitutional: He is oriented to person, place, and time. He appears well-developed and well-nourished. No distress.  HENT:  Mouth/Throat: Oropharynx is clear and moist.  Eyes: Conjunctivae are normal. No scleral icterus.  Neck: Neck supple. No tracheal deviation present.  Cardiovascular: Normal rate, regular rhythm, normal heart sounds and intact distal pulses.  Exam reveals no gallop and no friction rub.   No murmur heard. Pulmonary/Chest: Effort normal and breath sounds normal. No accessory muscle usage. No respiratory distress.  Abdominal: Soft. He exhibits no distension. There is no tenderness.  Musculoskeletal: Normal range of motion.  Neurological: He is alert and oriented to person, place, and time.  Skin: Skin is warm and dry. No rash noted. He is not diaphoretic.  Psychiatric: He has a normal mood and affect.  Nursing note and vitals reviewed.   ED Course  Procedures (including critical care time) Labs Review  Results for orders placed or performed during the hospital encounter of 09/07/15  Comprehensive metabolic panel  Result Value Ref Range   Sodium 132 (L) 135 - 145 mmol/L   Potassium 4.5 3.5 - 5.1 mmol/L   Chloride 98 (L) 101 - 111 mmol/L   CO2 23 22 - 32 mmol/L   Glucose, Bld 351 (H) 65 - 99 mg/dL   BUN 24 (H) 6 - 20 mg/dL   Creatinine, Ser 1.34 (H) 0.61 - 1.24 mg/dL   Calcium 9.1 8.9 - 10.3 mg/dL   Total Protein 7.7 6.5 - 8.1 g/dL   Albumin 3.7 3.5 - 5.0 g/dL   AST 22 15 - 41 U/L   ALT 20 17 - 63 U/L   Alkaline Phosphatase 88 38 - 126 U/L   Total Bilirubin 0.8 0.3 - 1.2 mg/dL   GFR calc non Af Amer >60 >60 mL/min   GFR calc Af Amer >60 >60 mL/min   Anion gap 11 5 - 15  CBC WITH DIFFERENTIAL  Result Value Ref Range   WBC 10.0 4.0 - 10.5 K/uL   RBC 3.95 (L) 4.22 - 5.81 MIL/uL   Hemoglobin 11.6 (L) 13.0 - 17.0 g/dL   HCT 34.1 (L) 39.0 - 52.0 %   MCV 86.3 78.0 - 100.0 fL   MCH 29.4 26.0 - 34.0 pg   MCHC 34.0 30.0 - 36.0 g/dL   RDW 12.9 11.5 - 15.5 %    Platelets 279 150 - 400 K/uL   Neutrophils Relative % 66 %   Neutro Abs 6.7 1.7 - 7.7 K/uL   Lymphocytes Relative 17 %   Lymphs Abs 1.7 0.7 - 4.0 K/uL   Monocytes Relative 14 %   Monocytes Absolute 1.4 (H) 0.1 - 1.0 K/uL   Eosinophils Relative 2 %   Eosinophils Absolute 0.2 0.0 - 0.7 K/uL   Basophils Relative 1 %  Basophils Absolute 0.1 0.0 - 0.1 K/uL  CBG monitoring, ED  Result Value Ref Range   Glucose-Capillary 374 (H) 65 - 99 mg/dL   Comment 1 Notify RN   I-Stat CG4 Lactic Acid, ED  (not at  [facility])  Result Value Ref Range   Lactic Acid, Venous 1.90 0.5 - 2.0 mmol/L   Dg Foot Complete Right  09/07/2015  CLINICAL DATA:  Severe plantar right foot pain since yesterday morning. No known injury. EXAM: RIGHT FOOT COMPLETE - 3+ VIEW COMPARISON:  None. FINDINGS: Distal soft tissue swelling. There is also deformity of the lateral aspect of the base of the first distal phalanx, involving the first IP joint. There is also mild dorsal tarsal spur formation. IMPRESSION: 1. Distal soft tissue swelling without acute underlying bony abnormality. 2. Old, healed first distal phalanx fracture, as described above. Electronically Signed   By: Claudie Revering M.D.   On: 09/07/2015 21:25       I have personally reviewed and evaluated these images and lab results as part of my medical decision-making.    MDM   Iv ns. Labs.  Reviewed nursing notes and prior charts for additional history.   Exam c/w cellulitis. No crepitus. Distal pulses palp.  Iv abx. Elevate leg.  Pt requests pain med, morphine iv.   Give hyperglycemia, elevated hr, systemic symptoms, shaking chills, cellulitis on exam, will admit.  Iv ns boluses.   Hospitalists paged for admission.  Vascular dopplers currently unavailable - admit team can get as part of pts workup in AM.   Hospitalists request tele med/temp orders.       Lajean Saver, MD 09/07/15 2159

## 2015-09-07 NOTE — Progress Notes (Signed)
Rx Brief Antibiotic note:  Zosyn/Vancomycin  See 12/4 note D Zeigler for full note  Plan:  Zosyn 3.375 gm IV q8h EI  Vancomycin 1GM (ED) + additional 1Gm = 2Gm load (Pt>100 kg) then 750mg  IV q12h  Thanks, Dorrene German 09/07/2015 11:50 PM

## 2015-09-07 NOTE — ED Notes (Signed)
Pt reports acute onset of rt lower leg pain that began yesterday, pt states pain is severe, foot is tender to palpation and wife reports swelling to rt foot. Pt w/ redness to rt foot, pt admits pain is radiating up calf and intermittently into right groin. Pt denies chest pain or shortness of breath - pt concerned d/t having an ablation to his neck on 08/26/15 and has also been experiencing hyperglycemia.

## 2015-09-07 NOTE — Progress Notes (Signed)
ANTIBIOTIC CONSULT NOTE - INITIAL  Pharmacy Consult for vancomycin/zosyn Indication: cellulitis  Allergies  Allergen Reactions  . Oxycontin [Oxycodone Hcl] Nausea And Vomiting  . Prednisone Other (See Comments)    Patient is diabetic, runs sugar up  . Shellfish-Derived Products     Pt is not allergic to iodine, has had iodine in the past w/o premeds and w/ no problems    Patient Measurements:   Adjusted Body Weight:   Vital Signs: Temp: 99.2 F (37.3 C) (12/04 1943) Temp Source: Oral (12/04 1943) BP: 167/92 mmHg (12/04 1943) Pulse Rate: 112 (12/04 1943) Intake/Output from previous day:   Intake/Output from this shift:    Labs: No results for input(s): WBC, HGB, PLT, LABCREA, CREATININE in the last 72 hours. CrCl cannot be calculated (Unknown ideal weight.). No results for input(s): VANCOTROUGH, VANCOPEAK, VANCORANDOM, GENTTROUGH, GENTPEAK, GENTRANDOM, TOBRATROUGH, TOBRAPEAK, TOBRARND, AMIKACINPEAK, AMIKACINTROU, AMIKACIN in the last 72 hours.   Microbiology: No results found for this or any previous visit (from the past 720 hour(s)).  Medical History: Past Medical History  Diagnosis Date  . Type 1 diabetes mellitus ([place])     a. With insulin pump.  . Gastroparesis   . Hypercholesteremia   . S/P cardiac cath     a. 10-15 yrs ago at [facility] due to tachycardia, reportedly normal. b. Normal ETT 03/2012.  Marland Kitchen Sinus tachycardia (HCC)     a. 24-hr Holter 03/2012 - SR, occ PVCs, no VT, avg HR 96bpm.  . HTN (hypertension)    Assessment: 43 YOM presents with cellulitis of RLE, pharmacy asked to dose vancomycin and zosyn.  12/4 >> vancomycin  >> 12/4 >>zosyn  >>    / blood:  Dose changes/levels:  No labs drawn  Goal of Therapy:  Vancomycin trough level 10-15 mcg/ml  Plan:   One-time doses vancomycin and zosyn ordered by ED  Await labs to determine maintenance doses  Suggest using order set to guide therapy for cellulitis, more narrow spectrum may be  appropriate  Doreene Eland, PharmD, BCPS.   Pager: [phone] 09/07/2015 8:39 PM

## 2015-09-07 NOTE — ED Notes (Signed)
Pt in radiology 

## 2015-09-07 NOTE — ED Notes (Signed)
Hospitalist at bedside 

## 2015-09-08 ENCOUNTER — Inpatient Hospital Stay (HOSPITAL_COMMUNITY): Payer: 59

## 2015-09-08 ENCOUNTER — Encounter (HOSPITAL_COMMUNITY): Payer: Self-pay

## 2015-09-08 DIAGNOSIS — N189 Chronic kidney disease, unspecified: Secondary | ICD-10-CM

## 2015-09-08 DIAGNOSIS — E119 Type 2 diabetes mellitus without complications: Secondary | ICD-10-CM

## 2015-09-08 DIAGNOSIS — M79606 Pain in leg, unspecified: Secondary | ICD-10-CM

## 2015-09-08 DIAGNOSIS — N179 Acute kidney failure, unspecified: Secondary | ICD-10-CM

## 2015-09-08 DIAGNOSIS — L03115 Cellulitis of right lower limb: Secondary | ICD-10-CM

## 2015-09-08 DIAGNOSIS — Z794 Long term (current) use of insulin: Secondary | ICD-10-CM

## 2015-09-08 LAB — GLUCOSE, CAPILLARY
Glucose-Capillary: 181 mg/dL — ABNORMAL HIGH (ref 65–99)
Glucose-Capillary: 134 mg/dL — ABNORMAL HIGH (ref 65–99)
Glucose-Capillary: 123 mg/dL — ABNORMAL HIGH (ref 65–99)
Glucose-Capillary: 173 mg/dL — ABNORMAL HIGH (ref 65–99)
Glucose-Capillary: 178 mg/dL — ABNORMAL HIGH (ref 65–99)
Glucose-Capillary: 199 mg/dL — ABNORMAL HIGH (ref 65–99)
Glucose-Capillary: 209 mg/dL — ABNORMAL HIGH (ref 65–99)

## 2015-09-08 LAB — CBC
HCT: 32.7 % — ABNORMAL LOW (ref 39.0–52.0)
Hemoglobin: 11 g/dL — ABNORMAL LOW (ref 13.0–17.0)
MCH: 29.2 pg (ref 26.0–34.0)
MCHC: 33.6 g/dL (ref 30.0–36.0)
MCV: 86.7 fL (ref 78.0–100.0)
Platelets: 200 10*3/uL (ref 150–400)
RBC: 3.77 MIL/uL — ABNORMAL LOW (ref 4.22–5.81)
RDW: 12.9 % (ref 11.5–15.5)
WBC: 6.4 10*3/uL (ref 4.0–10.5)

## 2015-09-08 LAB — BASIC METABOLIC PANEL
Anion gap: 5 (ref 5–15)
BUN: 17 mg/dL (ref 6–20)
CO2: 23 mmol/L (ref 22–32)
Calcium: 7.9 mg/dL — ABNORMAL LOW (ref 8.9–10.3)
Chloride: 105 mmol/L (ref 101–111)
Creatinine, Ser: 1.19 mg/dL (ref 0.61–1.24)
GFR calc Af Amer: >60 mL/min (ref >60)
GFR calc non Af Amer: >60 mL/min (ref >60)
Glucose, Bld: 227 mg/dL — ABNORMAL HIGH (ref 65–99)
Potassium: 4 mmol/L (ref 3.5–5.1)
Sodium: 133 mmol/L — ABNORMAL LOW (ref 135–145)

## 2015-09-08 LAB — LACTIC ACID, PLASMA: Lactic Acid, Venous: 1.2 mmol/L (ref 0.5–2.0)

## 2015-09-08 MED ORDER — METOCLOPRAMIDE HCL 5 MG/ML IJ SOLN
10.0000 mg | Freq: Four times a day (QID) | INTRAMUSCULAR | Status: DC | PRN
  Administered 2015-09-08 (×2): 10 mg via INTRAVENOUS
  Filled 2015-09-08 (×2): qty 2

## 2015-09-08 NOTE — Progress Notes (Signed)
PT Cancellation Note  Patient Details Name: [name] MRN: [id number] DOB: 03/19/1971   Cancelled Treatment:    Reason Eval/Treat Not Completed: Medical issues which prohibited therapy (N/V per RN. will check back later as schedule allows.)   Claretha Cooper 09/08/2015, 9:33 AM Tresa Endo PT [phone]

## 2015-09-08 NOTE — Progress Notes (Signed)
VASCULAR LAB PRELIMINARY  PRELIMINARY  PRELIMINARY  PRELIMINARY  Left lower extremity venous duplex completed.    Preliminary report:  There is no DVT or SVT noted in the right lower extremity.  There are multiple enlarged lymph nodes noted in the right proximal thigh and in the bilateral groins.  Jorita Bohanon, RVT 09/08/2015, 9:30 AM

## 2015-09-08 NOTE — Consult Note (Signed)
   Operating Room [facility] Inpatient Consult   09/08/2015  [name] 1971-09-09 [id number]   Patient is active with [facility] to [facility] DM program for [place] employees/dependents with [employer] [place] insurance. Spoke with patient to discuss his ongoing follow up with [facility] to [facility]. He states he needs to reschedule his appointment follow up. He missed his November appointment with the [facility] to [facility]. Please see chart review tab then notes for further correspondence from Link to Wellness program. Will make inpatient RNCM aware patient is active with the [place] to [place], MSN-Ed, RN,BSN [facility] Liaison [phone]

## 2015-09-08 NOTE — Progress Notes (Signed)
Inpatient Diabetes Program Recommendations  AACE/ADA: New Consensus Statement on Inpatient Glycemic Control (2015)  Target Ranges:  Prepandial:   less than 140 mg/dL      Peak postprandial:   less than 180 mg/dL (1-2 hours)      Critically ill patients:  140 - 180 mg/dL   Results for BRUK, CHOPIN (MRN [id number]) as of 09/08/2015 08:46  Ref. Range 09/07/2015 19:46 09/07/2015 23:45 09/08/2015 02:20 09/08/2015 06:05 09/08/2015 07:54  Glucose-Capillary Latest Ref Range: 65-99 mg/dL 374 (H) 206 (H) 181 (H) 134 (H) 123 (H)    Admit with: LE Cellulitis/ Sepsis  History: DM, CKD, HTN  Home DM Meds: Insulin Pump (see below for settings)  Current Insulin Orders: Insulin Pump (ac, hs, 2am)    -Spoke with patient this AM.  Patient A&O and able to independently manage his insulin pump.  Sees Dr. Chalmers Cater for insulin pump/Diabetes management.  Has appointment with Dr. Chalmers Cater on 09/11/15.  Patient has extra insulin pump supplies at bedside and plans to change his set/site/reservoir/insulin/tubing tomorrow (12/06).  -Glucose levels stabilizing.  Was 374 mg/dl on admission.  Patient stated he was in a lot of pain on admit and attributes elevated glucose to the pain.  CBG down to 123 mg/dl by 8am today.  -Reviewed pump settings with patient.  See below for pump settings.  -Reviewed charting responsibilities with RN caring for patient today.   Basal Rates:  12am- 0.9 units/hour 3am- 0.925 units/hour 11am- 0.85 units/hour 6pm- 1.25 units/hour  Total basal per 24 hour period= 23.55 units  Carbohydrate Ratio:  12am- 1 unit for every 8 grams carbohydrates consumed by patient 5pm- 1 unit for every 7 grams carbohydrates consumed by patient  Sensitivity Factor:  1 unit for every 25 mg/dl above target CBG  Target CBG= 120 mg/dl     --Will follow patient during hospitalization--  Wyn Quaker RN, MSN, CDE Diabetes Coordinator Inpatient Glycemic Control Team Team Pager:  [phone] (8a-5p)

## 2015-09-08 NOTE — Progress Notes (Signed)
[facility] PROGRESS NOTE  [name] [id number] DOB: [birth year] DOA: 09/07/2015 PCP: None Primary endocrinologist: Dr. Chalmers Cater  Brief narrative 44 year old obese male with type 1 diabetes mellitus on insulin pump, hypertension, hyperlipidemia, chronic kidney disease stage II, herniated cervical spinal disc presented with right leg pain with swelling and fever since one day. Patient reported pain and swelling with erythema extending from her right lower leg standing to the right second toe. He also had subjective fevers and chills with nausea. Patient reported having ablation of his neck for her next cervical disc on 08/26/2015 and since then he has noticed his blood sugar to be high.  The ED patient had low-grade fever, tachycardic and had acute kidney injury. X-ray of the right foot show distal soft tissue swelling without underlying abscess or bony abnormality. He was admitted for SIRS secondary to cellulitis of his right leg.  Assessment/Plan: SIRS due to cellulitis of the right leg and foot Patient clearly does not meet criteria for sepsis. Has uncontrolled blood glucose for past several days. Placed on empiric IV vancomycin and Zosyn. Pain control with when necessary Percocet and morphine. Doppler of the right leg negative for DVT. -Patient nausea and vomiting this morning. Continue when necessary Zofran alternating with Reglan. -Follow blood culture.  Uncontrolled type 1 diabetes mellitus neck line on insulin pump.  No recent A1c in the system. Patient hyperglycemic with blood sugar in 350s. Continue insulin pump. Diabetic coordinator consulted. Continue IV hydration. Check A1c. A follows with Dr. York Spaniel and has an appointment on 12/8.  Acute on chronic any disease stage II Baseline creatinine of 1.2. Possibly prerenal due to dehydration and use of ACE inhibitor and NSAIDs. Holding both. Improved with IV hydration is morning.   Hyperlipidemia Continue  Lipitor.  Hypertension Holding lisinopril due to acute kidney injury. When necessary hydralazine.  Herniated cervical disc Status post ablation and cervical fusion done recently. Continue when necessary Percocet.  Hyponatremia Secondary to dehydration. Monitor with fluids  Diet: Diabetic DVT prophylaxis: Subcutaneous heparin   Code Status: Full code Family Communication: None at bedside Disposition Plan: Home once improved. Possibly tomorrow   Consultants:  None  Procedures:  None  Antibiotics:  IV vancomycin and Zosyn  HPI/Subjective: Patient seen and examined. Complains pain in his right foot. Reports 3 episodes of vomiting since admission.  Objective: Filed Vitals:   09/08/15 0548 09/08/15 0628  BP:  132/79  Pulse:  73  Temp: 97.8 F (36.6 C) 97.5 F (36.4 C)  Resp:  18    Intake/Output Summary (Last 24 hours) at 09/08/15 1103 Last data filed at 09/08/15 0600  Gross per 24 hour  Intake    500 ml  Output      0 ml  Net    500 ml   Filed Weights   09/07/15 2108  Weight: 108.863 kg (240 lb)    Exam:   General: Middle aged obese male not in distress  HEENT: No pallor, moist oral mucosa, supple neck  Chest: Clear to auscultation bilaterally  Cardiovascular: S1 and S2, no murmurs rub or gallop  GI: Soft, nondistended, nontender, bowel sounds present  Musculoskeletal: Erythema over right leg significantly improved, has some erythema over the foot with tenderness  CNS: Alert and oriented  Data Reviewed: Basic Metabolic Panel:  Recent Labs Lab 09/07/15 2046 09/08/15 0130  NA 132* 133*  K 4.5 4.0  CL 98* 105  CO2 23 23  GLUCOSE 351* 227*  BUN 24* 17  CREATININE 1.34* 1.19  CALCIUM 9.1 7.9*   Liver Function Tests:  Recent Labs Lab 09/25/2015 2046  AST 22  ALT 20  ALKPHOS 88  BILITOT 0.8  PROT 7.7  ALBUMIN 3.7   No results for input(s): LIPASE, AMYLASE in the last 168 hours. No results for input(s): AMMONIA in the last 168  hours. CBC:  Recent Labs Lab 25-Sep-2015 2046 09/08/15 0130  WBC 10.0 6.4  NEUTROABS 6.7  --   HGB 11.6* 11.0*  HCT 34.1* 32.7*  MCV 86.3 86.7  PLT 279 200   Cardiac Enzymes:  Recent Labs Lab 2015/09/25 2239  CKTOTAL 276   BNP (last 3 results) No results for input(s): BNP in the last 8760 hours.  ProBNP (last 3 results) No results for input(s): PROBNP in the last 8760 hours.  CBG:  Recent Labs Lab 2015/09/25 1946 2015-09-25 2345 09/08/15 0220 09/08/15 0605 09/08/15 0754  GLUCAP 374* 206* 181* 134* 123*    No results found for this or any previous visit (from the past 240 hour(s)).   Studies: Dg Foot Complete Right  2015-09-25  CLINICAL DATA:  Severe plantar right foot pain since yesterday morning. No known injury. EXAM: RIGHT FOOT COMPLETE - 3+ VIEW COMPARISON:  None. FINDINGS: Distal soft tissue swelling. There is also deformity of the lateral aspect of the base of the first distal phalanx, involving the first IP joint. There is also mild dorsal tarsal spur formation. IMPRESSION: 1. Distal soft tissue swelling without acute underlying bony abnormality. 2. Old, healed first distal phalanx fracture, as described above. Electronically Signed   By: Claudie Revering M.D.   On: 2015/09/25 21:25    Scheduled Meds: . aspirin EC  81 mg Oral Daily  . atorvastatin  40 mg Oral Daily  . heparin  5,000 Units Subcutaneous 3 times per day  . insulin pump   Subcutaneous TID AC, HS, 0200  . piperacillin-tazobactam (ZOSYN)  IV  3.375 g Intravenous Q8H  . sodium chloride  3 mL Intravenous Q12H  . vancomycin  750 mg Intravenous Q12H   Continuous Infusions: . sodium chloride 100 mL/hr at 09/08/15 1000      Time spent:25 minutes    Javad Salva  [facility] Pager [phone]. If 7PM-7AM, please contact night-coverage at [email or web address], password [facility] 09/08/2015, 11:03 AM  LOS: 1 day

## 2015-09-09 DIAGNOSIS — I10 Essential (primary) hypertension: Secondary | ICD-10-CM

## 2015-09-09 DIAGNOSIS — M5020 Other cervical disc displacement, unspecified cervical region: Secondary | ICD-10-CM

## 2015-09-09 DIAGNOSIS — M79606 Pain in leg, unspecified: Secondary | ICD-10-CM | POA: Insufficient documentation

## 2015-09-09 DIAGNOSIS — M79604 Pain in right leg: Secondary | ICD-10-CM

## 2015-09-09 DIAGNOSIS — N179 Acute kidney failure, unspecified: Secondary | ICD-10-CM | POA: Insufficient documentation

## 2015-09-09 LAB — GLUCOSE, CAPILLARY
Glucose-Capillary: 93 mg/dL (ref 65–99)
Glucose-Capillary: 111 mg/dL — ABNORMAL HIGH (ref 65–99)
Glucose-Capillary: 125 mg/dL — ABNORMAL HIGH (ref 65–99)

## 2015-09-09 LAB — HEMOGLOBIN A1C
Hgb A1c MFr Bld: 12.5 % — ABNORMAL HIGH (ref 4.8–5.6)
Mean Plasma Glucose: 312 mg/dL

## 2015-09-09 MED ORDER — KETOROLAC TROMETHAMINE 30 MG/ML IJ SOLN
30.0000 mg | Freq: Once | INTRAMUSCULAR | Status: AC
  Administered 2015-09-09: 30 mg via INTRAMUSCULAR
  Filled 2015-09-09: qty 1

## 2015-09-09 MED ORDER — METOCLOPRAMIDE HCL 5 MG PO TABS: 5.0000 mg | ORAL_TABLET | Freq: Three times a day (TID) | ORAL | Status: DC | PRN

## 2015-09-09 MED ORDER — OXYCODONE-ACETAMINOPHEN 5-325 MG PO TABS: 2.0000 | ORAL_TABLET | Freq: Four times a day (QID) | ORAL | Status: DC | PRN

## 2015-09-09 MED ORDER — DOXYCYCLINE HYCLATE 100 MG PO TABS: 100.0000 mg | ORAL_TABLET | Freq: Two times a day (BID) | ORAL | Status: AC

## 2015-09-09 NOTE — Progress Notes (Signed)
Pt lying in bed with right leg elevated with c/o severe right foot pain from the ball of his foot up to his groin. Pt States "The pain pills didn't seem help. It really hurts like when I came in." MD paged and given update via phone. New orders to be placed.

## 2015-09-09 NOTE — Discharge Summary (Addendum)
Physician Discharge Summary  [name] [id number] DOB: August 29, 1971 DOA: 09/07/2015  PCP: Nicoletta Dress, MD  Admit date: 09/07/2015 Discharge date: 09/09/2015  Time spent: >30 minutes  Recommendations for Outpatient Follow-up:  1. Discharge home on oral doxycycline for 10 more days. Stop date 09/18/2015.    Discharge Diagnoses:  Principal Problem:   Cellulitis of leg, right   Active Problems:   IDDM (insulin dependent diabetes mellitus) (HCC)   Hypercholesteremia   HTN (hypertension)   Sepsis (HCC)   Acute on chronic kidney failure ([place])   Herniated cervical disc   Discharge Condition: Fair  Diet recommendation: Diabetic, low-sodium  Filed Weights   09/07/15 2108  Weight: 108.863 kg (240 lb)    History of present illness:  Please refer to admission H&P for details, in brief,44 year old obese male with type 1 diabetes mellitus on insulin pump, hypertension, hyperlipidemia, chronic kidney disease stage II, herniated cervical spinal disc presented with right leg pain with swelling and fever since one day. Patient reported pain and swelling with erythema extending from her right lower leg standing to the right second toe. He also had subjective fevers and chills with nausea. Patient reported having ablation of his neck for her next cervical disc on 08/26/2015 and since then he has noticed his blood sugar to be high.  The ED patient had low-grade fever, tachycardic and had acute kidney injury. X-ray of the right foot show distal soft tissue swelling without underlying abscess or bony abnormality. He was admitted for SIRS secondary to cellulitis of his right leg.  Hospital Course:  SIRS due to cellulitis of the right leg and foot Patient clearly does not meet criteria for sepsis. Has uncontrolled blood glucose for past several days. Placed on empiric IV vancomycin and Zosyn. Pain control with when necessary Percocet and morphine. Doppler of the right leg negative  for DVT. -Cellulitis much improved and limiting to right foot. Will discharge him on oral doxycycline. Patient has significant pain in his right foot (worsened on weightbearing and ambulation) I think there is a component  of plantar fasciitis. Have informed him to alternate NSAIDs with Percocet. Instructed him on stretching exercise of his foot and calf, getting comfortable cushioned shoes for walking and avoiding long standing or walking for a few days. (I give recommendations to patient's supervisor who had come to visit him this morning)  Uncontrolled type 1 diabetes mellitus neck line on insulin pump.   Patient hyperglycemic with blood sugar in 350s. Continue insulin pump. Diabetic coordinator consulted. Given IV hydration and resume his insulin pump.  A1c of 12.5. A follows with Dr. Chalmers Cater and has an appointment on 12/8. Will adjust insulin dose as outpatient.  Acute on chronic any disease stage II Baseline creatinine of 1.2. Possibly prerenal due to dehydration and use of ACE inhibitor and NSAIDs. Medications were held during hospital stay and renal function normal after IV hydration.   Hyperlipidemia Continue Lipitor.  Hypertension Resume lisinopril.Marland Kitchen  Herniated cervical disc Status post ablation and cervical fusion done recently. Continue when necessary Percocet.  Hyponatremia Secondary to dehydration. Monitor with fluids  Diet: Diabetic DVT prophylaxis: Subcutaneous heparin   Code Status: Full code Family Communication: None at bedside Disposition Plan: Home with outpatient follow-up   Consultants:  None  Procedures:  None  Antibiotics:  IV vancomycin and Zosyn  HPI/Subjective:  Discharge Exam: Filed Vitals:   09/08/15 2056 09/09/15 0446  BP: 129/76 137/68  Pulse: 82 85  Temp: 98.6 F (37 C) 98.1 F (36.7  C)  Resp: 18 18     General: Middle aged obese male not in distress  HEENT: No pallor, moist oral mucosa, supple neck  Chest: Clear to  auscultation bilaterally  Cardiovascular: S1 and S2, no murmurs rub or gallop  GI: Soft, nondistended, nontender, bowel sounds present  Musculoskeletal: Erythema over right leg resolved. Has slight erythema with swelling over right foot with tenderness on the plantar surface underneath the toes .  CNS: Alert and oriented  Discharge Instructions    Current Discharge Medication List    START taking these medications   Details  doxycycline (VIBRA-TABS) 100 MG tablet Take 1 tablet (100 mg total) by mouth 2 (two) times daily. Qty: 20 tablet, Refills: 0    metoCLOPramide (REGLAN) 5 MG tablet Take 1 tablet (5 mg total) by mouth every 8 (eight) hours as needed for nausea or vomiting. Qty: 15 tablet, Refills: 0      CONTINUE these medications which have NOT CHANGED   Details  aspirin EC 81 MG tablet Take 81 mg by mouth daily.    atorvastatin (LIPITOR) 40 MG tablet Take 40 mg by mouth daily.    calcium carbonate (TUMS - DOSED IN MG ELEMENTAL CALCIUM) 500 MG chewable tablet Chew 1 tablet by mouth 3 (three) times daily as needed for indigestion or heartburn.    ibuprofen (ADVIL,MOTRIN) 200 MG tablet Take 600 mg by mouth every 6 (six) hours as needed for headache, mild pain or moderate pain. 3 tablets as needed    Insulin Human (INSULIN PUMP) SOLN by Continuous infusion (non-IV) route. Humalog    lisinopril (PRINIVIL,ZESTRIL) 20 MG tablet Take 20 mg by mouth daily.     oxyCODONE-acetaminophen (PERCOCET/ROXICET) 5-325 MG per tablet Take 1-2 tablets by mouth every 6 (six) hours as needed for severe pain. Qty: 15 tablet, Refills: 0    sildenafil (VIAGRA) 100 MG tablet Take 100 mg by mouth daily as needed for erectile dysfunction.      STOP taking these medications     ondansetron (ZOFRAN) 4 MG tablet        Allergies  Allergen Reactions  . Oxycontin [Oxycodone Hcl] Nausea And Vomiting  . Prednisone Other (See Comments)    Patient is diabetic, runs sugar up  . Shellfish-Derived  Products     Pt is not allergic to iodine, has had iodine in the past w/o premeds and w/ no problems   Follow-up Information    Follow up with Jacelyn Pi, MD On 09/11/2015.   Specialty:  Endocrinology   Contact information:   [address] [place] [address] [phone]        The results of significant diagnostics from this hospitalization (including imaging, microbiology, ancillary and laboratory) are listed below for reference.    Significant Diagnostic Studies: Dg Foot Complete Right  16-Sep-2015  CLINICAL DATA:  Severe plantar right foot pain since yesterday morning. No known injury. EXAM: RIGHT FOOT COMPLETE - 3+ VIEW COMPARISON:  None. FINDINGS: Distal soft tissue swelling. There is also deformity of the lateral aspect of the base of the first distal phalanx, involving the first IP joint. There is also mild dorsal tarsal spur formation. IMPRESSION: 1. Distal soft tissue swelling without acute underlying bony abnormality. 2. Old, healed first distal phalanx fracture, as described above. Electronically Signed   By: Claudie Revering M.D.   On: 2015/09/16 21:25    Microbiology: No results found for this or any previous visit (from the past 240 hour(s)).   Labs: Basic  Metabolic Panel:  Recent Labs Lab 09/07/15 2046 09/08/15 0130  NA 132* 133*  K 4.5 4.0  CL 98* 105  CO2 23 23  GLUCOSE 351* 227*  BUN 24* 17  CREATININE 1.34* 1.19  CALCIUM 9.1 7.9*   Liver Function Tests:  Recent Labs Lab 09/07/15 2046  AST 22  ALT 20  ALKPHOS 88  BILITOT 0.8  PROT 7.7  ALBUMIN 3.7   No results for input(s): LIPASE, AMYLASE in the last 168 hours. No results for input(s): AMMONIA in the last 168 hours. CBC:  Recent Labs Lab 09/07/15 2046 09/08/15 0130  WBC 10.0 6.4  NEUTROABS 6.7  --   HGB 11.6* 11.0*  HCT 34.1* 32.7*  MCV 86.3 86.7  PLT 279 200   Cardiac Enzymes:  Recent Labs Lab 09/07/15 2239  CKTOTAL 276   BNP: BNP (last 3 results) No  results for input(s): BNP in the last 8760 hours.  ProBNP (last 3 results) No results for input(s): PROBNP in the last 8760 hours.  CBG:  Recent Labs Lab 09/08/15 1337 09/08/15 1634 09/08/15 2059 09/09/15 0246 09/09/15 0744  GLUCAP 178* 199* 209* 93 111*       Signed:  Neven Fina  [facility] 09/09/2015, 9:33 AM

## 2015-09-09 NOTE — Consult Note (Signed)
   [facility] CM Inpatient Consult   09/09/2015  RANDOM TOUSANT [birth year] [id number]   Came to visit patient at bedside on behalf of [facility]. He reports he started having excruciating pain upon discharge. In bed with leg elevated. He endorses he plans on following up with [facility] to [place] for an appointment for DM management. Appreciative of visit.  Marthenia Rolling, MSN-Ed, RN,BSN [facility] Liaison [phone]

## 2015-09-09 NOTE — Progress Notes (Signed)
Discussed with Pt regarding new MD order for Toradol IM for unrelieved right leg pain. Pt given IM Toradol, heat packs to right leg and leg elevated on 2 pillows. Maintain current plan of care for Pt

## 2015-09-09 NOTE — Progress Notes (Signed)
Pt states pain after IM Toradol better. Pt staes "It feels now like my leg is sore like I have a big bruise up my leg." Pt is to ambulate in the hallway and tolerated activity fair. MD given update via phone and ok to discharge Pt this afternoon.

## 2015-09-09 NOTE — Discharge Instructions (Signed)

## 2015-09-09 NOTE — Evaluation (Signed)
Physical Therapy One Time Evaluation Patient Details Name: [name] MRN: [id number] DOB: 13-Oct-1970 Today's Date: 09/09/2015   History of Present Illness  44 yo male admitted with R LE cellulitis and possible exacerbation of plantar fasciitis, PMH of type I diabetes mellitus on insulin Pump, hypertension, hyperlipidemia, CKD-2, herniated cervical spine disc, who presents with right leg pain, swelling and fever.  Clinical Impression  Pt admitted with above diagnosis. Pt demonstrated no functional limitations, however, his pain in R LE increases during ambulation. Educated pt on stretches, ice application, and future follow up with outpatient PT if needed. No PT follow up necessary at this time, no needs for equipment.      Follow Up Recommendations No PT follow up    Equipment Recommendations  None recommended by PT    Recommendations for Other Services       Precautions / Restrictions Precautions Precautions: None Precaution Comments: recent cervical ablation Restrictions Weight Bearing Restrictions: No      Mobility  Bed Mobility Overal bed mobility: Modified Independent             General bed mobility comments: increased time, did not require assistance from therapist   Transfers Overall transfer level: Modified independent Equipment used: None             General transfer comment: increased time d/t pain  Ambulation/Gait Ambulation/Gait assistance: Modified independent (Device/Increase time) Ambulation Distance (Feet): 320 Feet Assistive device: None (IV pole initially) Gait Pattern/deviations: Step-through pattern;Decreased stance time - right;Decreased step length - left;Decreased weight shift to right;Antalgic     General Gait Details: no cues required, pt is high functioning, however pain increases with WB on R LE, possible diagnosis of plantar fasciitis  Stairs            Wheelchair Mobility    Modified Rankin (Stroke Patients  Only)       Balance Overall balance assessment: No apparent balance deficits (not formally assessed)                                           Pertinent Vitals/Pain Pain Assessment: 0-10 Pain Score: 8  Pain Location: ball of the R foot during ambulation Pain Descriptors / Indicators: Aching;Sore Pain Intervention(s): Limited activity within patient's tolerance;Monitored during session;Repositioned;Ice applied    Home Living Family/patient expects to be discharged to:: Private residence Living Arrangements: Spouse/significant other Available Help at Discharge: Family Type of Home: House Home Access: Stairs to enter Entrance Stairs-Rails: Right Entrance Stairs-Number of Steps: 3 Home Layout: Able to live on main level with bedroom/bathroom        Prior Function Level of Independence: Independent               Hand Dominance        Extremity/Trunk Assessment   Upper Extremity Assessment: Overall WFL for tasks assessed           Lower Extremity Assessment: RLE deficits/detail RLE Deficits / Details: ankle AROM WFL, functional strength observed at least 3+/5      Cervical / Trunk Assessment: Normal  Communication   Communication: No difficulties  Cognition Arousal/Alertness: Awake/alert Behavior During Therapy: WFL for tasks assessed/performed Overall Cognitive Status: Within Functional Limits for tasks assessed                      General Comments  Exercises Other Exercises Other Exercises: plantar fascia stretch with a sheet, educated pt on stretching plantar fascia and tight musculature of lower leg, specifically tibialis posterior and flexor hallucis longus   Other Exercises: educated pt on ice application paying attention to timing (no longer than 20 minutes) d/t mild peripheral 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      Assessment/Plan    PT Assessment Patent does not need any further PT services  PT Diagnosis Difficulty  walking   PT Problem List    PT Treatment Interventions     PT Goals (Current goals can be found in the Care Plan section) Acute Rehab PT Goals PT Goal Formulation: All assessment and education complete, DC therapy    Frequency     Barriers to discharge        Co-evaluation               End of Session   Activity Tolerance: Patient tolerated treatment well Patient left: in chair;with call bell/phone within reach;with chair alarm set Nurse Communication: Mobility status         Time: [id number] PT Time Calculation (min) (ACUTE ONLY): 20 min   Charges:   PT Evaluation $Initial PT Evaluation Tier I: 1 Procedure     PT G Codes:        Mariellen Blaney, SPT 10/04/2015, 1:55 PM

## 2015-09-10 ENCOUNTER — Other Ambulatory Visit: Payer: Self-pay

## 2015-09-10 NOTE — Patient Outreach (Signed)
[facility]) Care Management  09/10/2015  [name] [birth year] [id number]  Transition of care phone call.  No answer and message left.  Plan to contact tomorrow 09/11/15 [name] Garter RN, [facility] Care Management Coordinator-Link to [place] Management [phone]

## 2015-09-11 ENCOUNTER — Ambulatory Visit (INDEPENDENT_AMBULATORY_CARE_PROVIDER_SITE_OTHER): Payer: 59 | Admitting: Podiatry

## 2015-09-11 ENCOUNTER — Encounter: Payer: Self-pay | Admitting: Podiatry

## 2015-09-11 ENCOUNTER — Other Ambulatory Visit: Payer: Self-pay

## 2015-09-11 VITALS — Temp 98.1°F

## 2015-09-11 DIAGNOSIS — L02619 Cutaneous abscess of unspecified foot: Secondary | ICD-10-CM

## 2015-09-11 DIAGNOSIS — L03119 Cellulitis of unspecified part of limb: Secondary | ICD-10-CM

## 2015-09-11 LAB — CBC WITH DIFFERENTIAL/PLATELET
Basophils Absolute: 0.1 10*3/uL (ref 0.0–0.1)
Basophils Relative: 1 % (ref 0–1)
Eosinophils Absolute: 0.4 10*3/uL (ref 0.0–0.7)
Eosinophils Relative: 6 % — ABNORMAL HIGH (ref 0–5)
HCT: 38.8 % — ABNORMAL LOW (ref 39.0–52.0)
Hemoglobin: 12.8 g/dL — ABNORMAL LOW (ref 13.0–17.0)
Lymphocytes Relative: 23 % (ref 12–46)
Lymphs Abs: 1.6 10*3/uL (ref 0.7–4.0)
MCH: 28.6 pg (ref 26.0–34.0)
MCHC: 33 g/dL (ref 30.0–36.0)
MCV: 86.8 fL (ref 78.0–100.0)
MPV: 9.1 fL (ref 8.6–12.4)
Monocytes Absolute: 1 10*3/uL (ref 0.1–1.0)
Monocytes Relative: 15 % — ABNORMAL HIGH (ref 3–12)
Neutro Abs: 3.8 10*3/uL (ref 1.7–7.7)
Neutrophils Relative %: 55 % (ref 43–77)
Platelets: 284 10*3/uL (ref 150–400)
RBC: 4.47 MIL/uL (ref 4.22–5.81)
RDW: 13 % (ref 11.5–15.5)
WBC: 6.9 10*3/uL (ref 4.0–10.5)

## 2015-09-11 MED ORDER — AMOXICILLIN-POT CLAVULANATE 875-125 MG PO TABS: 1.0000 | ORAL_TABLET | Freq: Two times a day (BID) | ORAL | Status: DC

## 2015-09-11 NOTE — Patient Outreach (Signed)
[facility]) Care Management  09/11/2015  [name] 1970-10-12 [id number] Second attempt to contact member after discharged from hospital.  No answer and message left.  Plan to call tomorrow 09/12/15. [name] Garter RN, [facility] Care Management Coordinator-Link to [place] Management [phone]

## 2015-09-11 NOTE — Progress Notes (Signed)
   Subjective:    Patient ID: [name], male    DOB: 1970-10-13, 44 y.o.   MRN: [id number]  HPI Pt presents with painful lesion on bottom of right foot with drainage, pt is currently on abts and endocrinologist prescribed a stronger abt. Redness and pain noted on plantar forefoot where leswion is located and redness noted on dorsal foot right, with noted warmth    Review of Systems  Gastrointestinal: Positive for nausea.  All other systems reviewed and are negative.      Objective:   Physical Exam        Assessment & Plan:

## 2015-09-12 ENCOUNTER — Other Ambulatory Visit: Payer: Self-pay

## 2015-09-12 LAB — SEDIMENTATION RATE: Sed Rate: 77 mm/hr — ABNORMAL HIGH (ref 0–15)

## 2015-09-12 NOTE — Patient Outreach (Signed)
[facility]) Care Management  09/12/2015  ANDI MACBRIDE 03/24/1971 [id number]  Third attempt to contact member after hospitalization.  No answer and message left.  Plan to attempt call on 09/15/15 and if no response will send letter. [name] Garter RN, [facility] Care Management Coordinator-Link to [place] Management [phone]

## 2015-09-13 LAB — CULTURE, BLOOD (ROUTINE X 2)
Culture: NO GROWTH
Culture: NO GROWTH

## 2015-09-14 LAB — WOUND CULTURE
Gram Stain: NONE SEEN
Gram Stain: NONE SEEN
Organism ID, Bacteria: NO GROWTH

## 2015-09-14 NOTE — Progress Notes (Signed)
Subjective:     Patient ID: [name], male   DOB: 1971/07/28, 44 y.o.   MRN: [id number]  HPI patient presents with a lesion on the bottom of his right foot with drainage. Patient noted this over the last several weeks and is currently on antibiotics from his endocrinologist and currently does not have a temperature. He did go to the emergency room and also was prescribed an oral antibiotic and had x-rays performed the air and then at that time saw his endocrinologist  prescribed a different antibiotic   Review of Systems  All other systems reviewed and are negative.      Objective:   Physical Exam  Constitutional: He is oriented to person, place, and time.  Cardiovascular: Intact distal pulses.   Musculoskeletal: Normal range of motion.  Neurological: He is oriented to person, place, and time.  Skin: Skin is warm and dry.  Nursing note and vitals reviewed.  neurovascular status was found to be intact as far as pulses and mild diminishment of sharp Dole vibratory. Patient is noted to have good digital perfusion and is well oriented 3 and presents with wife today. On the forefoot right I noted mild redness around the second and third metatarsals dorsal with no proximal edema erythema drainage or lymph no distention noted currently. Patient's temperature is normal and his sugar has been running typical for him at this time. On the plantar surface I noted some yellow numbness and he stated that he was able to get some drainage out of this area after he soak it and pressed on it. It is localized to the second and third metatarsal crease between the digits and the metatarsal and slightly proximal to this area with no erythema edema but some yellow discoloration and no drainage noted     Assessment:     Probable low grade cellulitic process with possibility for plantar abscess    Plan:     H&P and x-rays reviewed that do not indicate bony destruction currently. At this time I did do a  proximal nerve block and after appropriate numbness using a 15 blade I did open up the arch area. I did not note any overt drainage currently or odor but I did do a deep culture of this area and I packed the area which was approximately 1 cm in length with Iodosorb tape in order to help if there is an abscess present. I explained to him and his wife that I want them to return to me on Monday for reevaluation and I gave strict instructions that if there should be any increased swelling redness pain or any systemic signs of infection they are to go straight to the emergency room. I did explain the possibility for more surgery in the future and the possibility for amputation if it turns out there is a deeper issue occurring. I placed him on  Augmentin 875 mg twice a day and advised on continuing his doxycycline that he has until we get the results of cultures. I dispensed surgical shoe at this time

## 2015-09-15 ENCOUNTER — Ambulatory Visit (INDEPENDENT_AMBULATORY_CARE_PROVIDER_SITE_OTHER): Payer: 59 | Admitting: Podiatry

## 2015-09-15 ENCOUNTER — Encounter: Payer: Self-pay | Admitting: Podiatry

## 2015-09-15 ENCOUNTER — Other Ambulatory Visit: Payer: Self-pay

## 2015-09-15 VITALS — BP 154/96 | HR 93 | Temp 98.5°F | Resp 16

## 2015-09-15 DIAGNOSIS — L02619 Cutaneous abscess of unspecified foot: Secondary | ICD-10-CM | POA: Diagnosis not present

## 2015-09-15 DIAGNOSIS — L03119 Cellulitis of unspecified part of limb: Secondary | ICD-10-CM | POA: Diagnosis not present

## 2015-09-15 NOTE — Patient Outreach (Signed)
[facility]) Care Management  09/15/2015  [name] 1971/09/22 [id number]   Phone call to return message left by member on 09/22/15.  Member saw podiatrist today for follow up for foot infection.  States that he is on another antibiotic since last week after Dr.Regal drained his foot.  States that it is healing now and that Dr.Regal was pleased with his progress.  States he saw Dr.Balan last week and he is to schedule another follow up with her.  States his sugars have been improving and are ranging in the 160's range except it went up to 400 this AM because his pump site needed changing.  States they went down after the site change.  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    Appointment scheduled for follow up 09/25/15 [facility] CM Care Plan Problem One        Most Recent Value   Care Plan Problem One  Elevated blood sugars related to dx of Type 1 DM   Role Documenting the Problem One  Care Management [place] for Problem One  Active   THN Long Term Goal (31-90 days)  Member will decrease hemoglobin A1C by one point within the next 90 days   THN Long Term Goal Start Date  06/05/15   THN CM Short Term Goal #1 (0-30 days)  Member will not be readmitted to hospital for the next 30 days   THN CM Short Term Goal #1 Start Date  09/10/15   Interventions for Short Term Goal #1  Instructed member to keep appointments with Dr.Regal and Dr.Balan, Instructed to take antibiotics until gone, Instructed on importance of keeping blood sugars under control to help with wound healing    [name] Garter RN, [facility] Care Management Coordinator-Link to [place] Management [phone]

## 2015-09-16 NOTE — Progress Notes (Signed)
Subjective:     Patient ID: ORMOND SCHERER, male   DOB: 1970/10/25, 44 y.o.   MRN: [id number]  HPI patient states that he is doing much better with significant reduction of discomfort and no redness in the foot and his temperature has been running normal and his sugar has been running high just today but prior to that it was doing fine   Review of Systems     Objective:   Physical Exam Neurovascular status unchanged with patient's plantar incision right healed well with no drainage no edema or erythema noted presently. Patient is well oriented 3 and has no proximal edema erythema or lymph no distention    Assessment:     Doing well from aggressive abscess reduction right plantar foot with no current indications of infection    Plan:     Reviewed condition at great length and discussed the absolute importance that he needs to get his sugar under better control. Patient promises to do this and we'll continue to monitor the plantar foot and if any redness erythema edema or any systemic signs of infection were to occur he is to go to the emergency room where he will require IV antibiotics and more aggressive opening of this area. Patient was given all thorough instructions and the fact that this is a very serious condition and that there is a possibility for reoccurrence

## 2015-09-17 ENCOUNTER — Telehealth: Payer: Self-pay | Admitting: *Deleted

## 2015-09-17 NOTE — Telephone Encounter (Signed)
Pt states he feels his foot is not healing as quickly as he thought, because the bandaid that Dr. Paulla Dolly told him to use 09/15/2015 is holding the moisture in.  Pt states Dr. Paulla Dolly had told him to soak with [facility], but he's stopped that because that and the bandaid made the area look worse.  I told pt to continue the epsom salt soaks at night, allow to air dry while resting then cover with a gauze pad and roll gauze to allow to wound to dry and get air.  Pt states understanding.

## 2015-09-22 ENCOUNTER — Telehealth: Payer: Self-pay | Admitting: *Deleted

## 2015-09-22 NOTE — Telephone Encounter (Addendum)
Dr. Paulla Dolly states blood work 09/11/2015 are within normal levels, sed rate is high, but depending on the pt's status may repeat blood work.  Left message with orders and results.  09/23/2015 - Pt states he was seen by Dr. Paulla Dolly about 2 weeks ago, a procedure was performed to open a wound on his foot, and now the area is not healing, and is becoming painful.  Pt request an appt.  Transferred pt to schedulers.

## 2015-09-24 ENCOUNTER — Ambulatory Visit (INDEPENDENT_AMBULATORY_CARE_PROVIDER_SITE_OTHER): Payer: 59 | Admitting: Podiatry

## 2015-09-24 ENCOUNTER — Encounter: Payer: Self-pay | Admitting: Podiatry

## 2015-09-24 VITALS — BP 128/88 | HR 92 | Temp 97.6°F | Resp 12

## 2015-09-24 DIAGNOSIS — L03119 Cellulitis of unspecified part of limb: Secondary | ICD-10-CM | POA: Diagnosis not present

## 2015-09-24 DIAGNOSIS — L02619 Cutaneous abscess of unspecified foot: Secondary | ICD-10-CM

## 2015-09-24 MED ORDER — SILVER SULFADIAZINE 1 % EX CREA: 1.0000 "application " | TOPICAL_CREAM | Freq: Every day | CUTANEOUS | Status: DC

## 2015-09-24 NOTE — Patient Instructions (Addendum)
I advise you to limit years standing walking until the wound on the bottom the right foot heals. 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 Paulla Dolly

## 2015-09-25 ENCOUNTER — Other Ambulatory Visit: Payer: Self-pay

## 2015-09-25 VITALS — BP 140/88 | HR 93 | Resp 15 | Ht 72.0 in | Wt 238.8 lb

## 2015-09-25 DIAGNOSIS — E109 Type 1 diabetes mellitus without complications: Secondary | ICD-10-CM

## 2015-09-25 NOTE — Patient Instructions (Signed)
Plan to see [name] at [facility].  Call to schedule.  [phone] Plan to check blood sugar before eating and bedtime and remember to bolus insulin.  Plan to join the [facility] and start exercise when foot is healed Plan to find a primary care provider and schedule an appointment Plan to have follow up calls on 10/02/15 11:45 and 10/08/14 1PM Plan to see [facility] on 11/03/15  at [facility]

## 2015-09-25 NOTE — Patient Outreach (Addendum)
[facility]) Care Management   09/25/2015  [name] Aug 01, 1971 [id number]  [name] is an 44 y.o. male.   Member seen for follow up office visit for [facility] program for self management of Type 1 diabetes.  Subjective: member states he saw the foot doctor yesterday and he told him his foot is healing.  States he is still on antibiotics and he is to dress his foot daily with Silvadene cream.  States he has been trying to watch is sugars better.  States that his sugars went up when he was having injections in his neck for pain.  States he is to see Dr. Chalmers Cater in April.  States he plans to find a primary care provider and he plans to check into going to his wife's MD.    Objective:   Review of Systems  Musculoskeletal: Positive for neck pain.  Dressing intact on rt foot with no drainage  Physical Exam Today's Vitals   09/25/15 1123 09/25/15 1127  BP: 140/88   Pulse: 93   Resp: 15   Height: 1.829 m (6')   Weight: 238 lb 12.8 oz (108.319 kg)   SpO2: 97%   PainSc: 2  2    Current Medications:   Current Outpatient Prescriptions  Medication Sig Dispense Refill  . amoxicillin-clavulanate (AUGMENTIN) 875-125 MG tablet Take 1 tablet by mouth 2 (two) times daily. 20 tablet 0  . aspirin EC 81 MG tablet Take 81 mg by mouth daily.    Marland Kitchen atorvastatin (LIPITOR) 40 MG tablet Take 40 mg by mouth daily.    . calcium carbonate (TUMS - DOSED IN MG ELEMENTAL CALCIUM) 500 MG chewable tablet Chew 1 tablet by mouth 3 (three) times daily as needed for indigestion or heartburn.    . Insulin Human (INSULIN PUMP) SOLN by Continuous infusion (non-IV) route. Humalog    . lisinopril (PRINIVIL,ZESTRIL) 20 MG tablet Take 20 mg by mouth daily.     . metoCLOPramide (REGLAN) 5 MG tablet Take 1 tablet (5 mg total) by mouth every 8 (eight) hours as needed for nausea or vomiting. 15 tablet 0  . oxyCODONE-acetaminophen (PERCOCET/ROXICET) 5-325 MG tablet Take 2 tablets by mouth  every 6 (six) hours as needed for severe pain. 15 tablet 0  . sildenafil (VIAGRA) 100 MG tablet Take 100 mg by mouth daily as needed for erectile dysfunction.    . silver sulfADIAZINE (SILVADENE) 1 % cream Apply 1 application topically daily. 50 g 0  . ibuprofen (ADVIL,MOTRIN) 200 MG tablet Take 600 mg by mouth every 6 (six) hours as needed for headache, mild pain or moderate pain. Reported on 09/25/2015     No current facility-administered medications for this visit.    Functional Status:   In your present state of health, do you have any difficulty performing the following activities: 09/25/2015 09/07/2015  Hearing? N N  Vision? N N  Difficulty concentrating or making decisions? N N  Walking or climbing stairs? N N  Dressing or bathing? N N  Doing errands, shopping? N N    Fall/Depression Screening:    PHQ 2/9 Scores 09/25/2015 06/05/2015  PHQ - 2 Score 0 1    Assessment:   Member seen for follow up office visit for [facility] program for self management of Type 1 diabetes.  Member is recovering from abscess of his right foot and had been hospitalized for cellulitis.  Member's hemoglobin A1C has increased to 12.5 from 9.4.  Member had an  ablation on his neck and had steroid injections in his next.  Reports that he is now trying to improve his eating habits.  Reports his blood sugars are ranging from 130-180 now.  Member does not have primary care provider but is followed by endocrinologist regularly.    Plan:  Plan to see [name] at [facility].  Call to schedule.  [phone] Plan to check blood sugar before eating and bedtime and remember to bolus insulin.  Plan to join the [facility] and start exercise when foot is healed Plan to find a primary care provider and schedule an appointment Plan to have follow up calls on 10/02/15 11:45 and 10/08/14 1PM Plan to see [facility] on 11/03/15  at [place] Problem One        Most Recent Value    Care Plan Problem One  Elevated blood sugars related to dx of Type 1 DM   Role Documenting the Problem One  Care Management Coordinator   Care Plan for Problem One  Active   THN Long Term Goal (31-90 days)  Member will decrease hemoglobin A1C by one point within the next 90 days   THN Long Term Goal Start Date  09/25/15 [Last hemoglobin A1C 12.5 increased from 9.4]   Interventions for Problem One Long Term Goal  Reviewed CHO counting and the importance of watching portion sizes, Reinforced to make an appointment with [name] to help him with his pump settings,  Encouraged to join [employer] with his wife and to start to exercise when his foot heals, Encouraged to call today to get an appointment with a new primary care provider(looking into going to wife's primary), Reinforced importance of keeping his blood sugars  under control to help decrease long term complications   THN CM Short Term Goal #1 (0-30 days)  Member will not be readmitted to hospital for the next 30 days   THN CM Short Term Goal #1 Start Date  09/10/15   Interventions for Short Term Goal #1  Instructed to keep appointments with Dr.Regal next week, Reinforced to take antibiotics until gone, Reinforced importance of keeping blood sugars under control to help with wound healing

## 2015-09-25 NOTE — Progress Notes (Signed)
Patient ID: [name], male   DOB: [birth year], 44 y.o.   MRN: [id number]  Subjective: This patient presents for follow-up for I&D of abscess on the plantar aspect of the right foot. Patient completing Augmentin by mouth twice a day without any complaints has several doses left. 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 He says he applies topical antibiotic ointment during the day with a gauze dressing and Leeza wound open at night.He is a known diabetic  Objective: Orientated 3 BP 128/88 Temperature 97.9  DP and PT pulses 2/4 bilaterally The plantar aspect of the right foot subsecond MPJ has a linear fissure, superficial ulcer measuring 20 mm x 2 mm. The base appears granular without any active drainage noted. There is a low-grade edema in the area without any surrounding erythema, or malodor  Assessment: Resolving cellulitis plantar aspect right foot Superficial ulceration associated with I&D of abscess without any obvious clinical sign of infection  Plan: Patient advised to complete any remaining Augmentin 875/125 by mouth twice a day as prescribed. Not recommending refill unless he develops sudden increase in pain, swelling, redness. Rx Silvadene cream to apply daily to the superficial ulcer plantar aspect right foot and cover with gauze advised patient to keep the wound site continuously covered with Silvadene gauze. Patient advised to wear surgical shoe on right foot and limit standing walking  I recommended patient that cause of this weightbearing occupation either he stays off work until this wound site heels or he gets a job modification requiring minimal standing and walking.  Reappoint 7 days for Dr. Paulla Dolly to follow

## 2015-10-02 ENCOUNTER — Encounter: Payer: Self-pay | Admitting: Podiatry

## 2015-10-02 ENCOUNTER — Ambulatory Visit (INDEPENDENT_AMBULATORY_CARE_PROVIDER_SITE_OTHER): Payer: 59 | Admitting: Podiatry

## 2015-10-02 ENCOUNTER — Other Ambulatory Visit: Payer: Self-pay

## 2015-10-02 VITALS — BP 134/89 | HR 97 | Resp 12

## 2015-10-02 DIAGNOSIS — L03119 Cellulitis of unspecified part of limb: Secondary | ICD-10-CM

## 2015-10-02 DIAGNOSIS — Z794 Long term (current) use of insulin: Principal | ICD-10-CM

## 2015-10-02 DIAGNOSIS — L02619 Cutaneous abscess of unspecified foot: Secondary | ICD-10-CM | POA: Diagnosis not present

## 2015-10-02 DIAGNOSIS — IMO0001 Reserved for inherently not codable concepts without codable children: Secondary | ICD-10-CM

## 2015-10-02 DIAGNOSIS — E119 Type 2 diabetes mellitus without complications: Principal | ICD-10-CM

## 2015-10-02 NOTE — Progress Notes (Signed)
Subjective:     Patient ID: [name], male   DOB: Apr 13, 1971, 44 y.o.   MRN: [id number]  HPI patient states it's doing a lot better but it still seems to be a little bit open on the bottom my right foot   Review of Systems     Objective:   Physical Exam Neurovascular status intact with keratotic tissue sub-right second metatarsal but it is a small fissure measuring about 1 cm in length by 1 or 2 mm in width with no drainage no odor or no indications of extension    Assessment:     Appears to be doing well with significant infection that he had plantar right foot    Plan:     Advised on padding therapy and today I trimmed the entire area debrided tissue and applied Iodosorb under occlusion which she will leave on for several days. Continue soaks continue padding and reappoint if this does not close in the next couple weeks or if any swelling or redness were to occur

## 2015-10-02 NOTE — Patient Outreach (Signed)
[facility]) Care Management  10/02/2015  ISSIAC SEACHRIST 30-Dec-1970 [id number]   Telephone call to follow up with member post hospitalization  Week 2 for abscess rt foot States he has had a good week.  States that he has been trying to stay off of his feet as much as possible.  States that his blood sugars have been good with no readings over 140 this last week and no low readings.  States he is to see Dr.Regal later today.  States he thinks his foot is healing but it is slow. Post hospitalization call.  Healing abscess rt foot Plan to call member next week to check on status [facility] CM Care Plan Problem One        Most Recent Value   Care Plan Problem One  Elevated blood sugars related to dx of Type 1 DM   Role Documenting the Problem One  Care Management [place] for Problem One  Active   THN Long Term Goal (31-90 days)  Member will decrease hemoglobin A1C by one point within the next 90 days   THN Long Term Goal Start Date  09/25/15 [Last hemoglobin A1C 12.5 increased from 9.4]   Interventions for Problem One Long Term Goal  Reviewed CHO counting and the importance of watching portion sizes, Reinforced to make an appointment with [name] to help him with his pump settings,  Encouraged to join [employer] with his wife and to start to exercise when his foot heals, Encouraged to call today to get an appointment with a new primary care provider(looking into going to wife's primary), Reinforced importance of keeping his blood sugars  under control to help decrease long term complications   THN CM Short Term Goal #1 (0-30 days)  Member will not be readmitted to hospital for the next 30 days   THN CM Short Term Goal #1 Start Date  09/10/15   Interventions for Short Term Goal #1  Reinforced to keep appointment with Dr.Regal today, Reinforced importance of keeping blood sugars under control to help with wound healing, Reinforced s/s of infection to call MD

## 2015-10-09 ENCOUNTER — Other Ambulatory Visit: Payer: Self-pay

## 2015-10-09 DIAGNOSIS — E109 Type 1 diabetes mellitus without complications: Secondary | ICD-10-CM

## 2015-10-09 NOTE — Patient Outreach (Signed)
[facility]) Care Management  10/09/2015  [name] [birth year] [id number]  Telephone call to follow up with member post hospitalization  for abscess rt foot States he has had a good week last week.  States that Dr.Regal told him his foot is healing.  States he is to go back in 2 weeks if it is not healing well and in one month otherwise.   States that he continues  to stay off of his feet as much as possible. States that his blood sugars have been good with no readings with the highest reading of 160 and averaging 120-130  this last week and no low readings.  States he thinks his foot is healing but it is slow. Post hospitalization call. Healing abscess rt foot Plan to see RNCM on 11/03/15 for scheduled visit. THN CM Care Plan Problem One        Most Recent Value   Care Plan Problem One  Elevated blood sugars related to dx of Type 1 DM   Role Documenting the Problem One  Care Management [place] for Problem One  Active   THN Long Term Goal (31-90 days)  Member will decrease hemoglobin A1C by one point within the next 90 days   THN Long Term Goal Start Date  09/25/15 [Last hemoglobin A1C 12.5 increased from 9.4]   Interventions for Problem One Long Term Goal  Reviewed CHO counting and the importance of watching portion sizes, Reinforced to make an appointment with [name] to help him with his pump settings,  Encouraged to join [employer] with his wife and to start to exercise when his foot heals, Encouraged to call today to get an appointment with a new primary care provider(looking into going to wife's primary), Reinforced importance of keeping his blood sugars  under control to help decrease long term complications   THN CM Short Term Goal #1 (0-30 days)  Member will not be readmitted to hospital for the next 30 days   THN CM Short Term Goal #1 Start Date  09/10/15   [facility] CM Short Term Goal #1 Met Date  10/09/15 [name] has not been readmitted in the  last 30 days]   Interventions for Short Term Goal #1  Reinforced to keep appointment with Dr.Regal, Instructed to keep appointment with [facility] on 11/03/15, Reinforced importance of keeping blood sugars under control to help with wound healing, Reinforced s/s of infection to call MD

## 2015-11-03 ENCOUNTER — Other Ambulatory Visit: Payer: Self-pay

## 2015-11-03 VITALS — BP 150/80 | HR 100 | Resp 16 | Ht 72.0 in | Wt 239.4 lb

## 2015-11-03 DIAGNOSIS — E109 Type 1 diabetes mellitus without complications: Secondary | ICD-10-CM

## 2015-11-03 NOTE — Patient Outreach (Signed)
[name]) Care Management   11/03/2015  [name] Aug 30, 1971 [id number]  [name] is an 45 y.o. male.   Member seen for follow up office visit for Link to Wellness program for self management of Type 1 diabetes  Subjective:  Member states that he has been trying very hard to keep his blood sugars under control.  States that having the foot infection scared him and he wants to take better care of himself.  States that his foot is almost healed and he is starting to walk 3 times a week.  States that his blood sugars are ranging 110-120 fasting in AM and 130-160 before meals.  States he has only had one low reading in the last month which he is not sure why he went low.    Objective:   ROS  Physical Exam  Skin:      Today's Vitals   11/03/15 1559  BP: 150/80  Pulse: 100  Resp: 16  Height: 1.829 m (6')  Weight: 239 lb 6.4 oz (108.591 kg)  SpO2: 99%  PainSc: 0-No pain    Current Medications:   Current Outpatient Prescriptions  Medication Sig Dispense Refill  . aspirin EC 81 MG tablet Take 81 mg by mouth daily.    Marland Kitchen atorvastatin (LIPITOR) 40 MG tablet Take 40 mg by mouth daily.    Marland Kitchen ibuprofen (ADVIL,MOTRIN) 200 MG tablet Take 600 mg by mouth every 6 (six) hours as needed for headache, mild pain or moderate pain. Reported on 09/25/2015    . Insulin Human (INSULIN PUMP) SOLN by Continuous infusion (non-IV) route. Humalog    . lisinopril (PRINIVIL,ZESTRIL) 20 MG tablet Take 20 mg by mouth daily.     . metoCLOPramide (REGLAN) 5 MG tablet Take 1 tablet (5 mg total) by mouth every 8 (eight) hours as needed for nausea or vomiting. 15 tablet 0  . sildenafil (VIAGRA) 100 MG tablet Take 100 mg by mouth daily as needed for erectile dysfunction.    Marland Kitchen amoxicillin-clavulanate (AUGMENTIN) 875-125 MG tablet Take 1 tablet by mouth 2 (two) times daily. (Patient not taking: Reported on 11/03/2015) 20 tablet 0  . calcium carbonate (TUMS - DOSED IN MG ELEMENTAL  CALCIUM) 500 MG chewable tablet Chew 1 tablet by mouth 3 (three) times daily as needed for indigestion or heartburn. Reported on 11/03/2015    . oxyCODONE-acetaminophen (PERCOCET/ROXICET) 5-325 MG tablet Take 2 tablets by mouth every 6 (six) hours as needed for severe pain. (Patient not taking: Reported on 11/03/2015) 15 tablet 0  . silver sulfADIAZINE (SILVADENE) 1 % cream Apply 1 application topically daily. 50 g 0   No current facility-administered medications for this visit.    Functional Status:   In your present state of health, do you have any difficulty performing the following activities: 11/03/2015 09/25/2015  Hearing? N N  Vision? N N  Difficulty concentrating or making decisions? N N  Walking or climbing stairs? N N  Dressing or bathing? N N  Doing errands, shopping? N N    Fall/Depression Screening:    PHQ 2/9 Scores 11/03/2015 09/25/2015 06/05/2015  PHQ - 2 Score 0 0 1    Assessment:   Member seen for follow up office visit for Link to Wellness program for self management of Type 1 diabetes.  Member continues to recover from abscess of his right foot and had been hospitalized for cellulitis. Member's hemoglobin A1C has increased to 12.5 from 9.4.  Reports that he is now trying to  improve his eating habits and starting to exercise. Reports his blood sugars are ranging from 110-160 now. Member does not have primary care provider but is followed by endocrinologist regularly. Member to see endocrinologist in April.   Plan:      Plan to [name].  Call to schedule.  [phone] Plan to check blood sugar before eating and remember to bolus insulin.  Plan to join the [facility] and start exercise when foot is healed Plan to walk 3 times a week for 60 minutes Plan to find a primary care provider and schedule an appointment Plan to schedule eye exam Plan to see [name] in April Plan to see Link to Wellness on 12/23/15  at 3:30PM  [facility] CM  Care Plan Problem One        Most Recent Value   Care Plan Problem One  Elevated blood sugars related to dx of Type 1 DM   Role Documenting the Problem One  Care Management [place] for Problem One  Active   THN Long Term Goal (31-90 days)  Member will decrease hemoglobin A1C by one point within the next 90 days   THN Long Term Goal Start Date  11/03/15 [name] hemoglobin A1C 12.5 increased from 9.4]   Interventions for Problem One Long Term Goal  Reviewed CHO counting and the importance of watching portion sizes, Reinforced to make an appointment with [name] to help him with his pump settings,  Encouraged to join th his wife, Reinforced to call today to get an appointment with a new primary care provider(looking into going to wife's primary), Reinforced importance of keeping his blood sugars  under control to help decrease long term complications   THN CM Short Term Goal #1 (0-30 days)  Member will not be readmitted to [name] for the next 30 days   THN CM Short Term Goal #1 Start Date  09/10/15   [name] CM Short Term Goal #1 Met Date  10/09/15 [name] has not been readmitted in the last [place] RN, [facility] Care Management Coordinator-Link to [name] Management [phone]

## 2015-11-04 NOTE — Patient Instructions (Addendum)
1. to see [name] at [facility].  Call to schedule.  [id number] 2. Plan to check blood sugar before eating and remember to bolus insulin.  3. Plan to join the [facility] and start exercise when foot is healed 4. Plan to walk 3 times a week for 60 minutes 5. Plan to find a primary care provider and schedule an appointment 6. Plan to schedule eye exam 7. Plan tPlan o see Dr. Chalmers Cater in April 8. Plan to see Link to Wellness on 12/23/15  at 3:30PM

## 2015-11-25 MED FILL — HumaLOG 100 UNIT/ML SOLN: 100 | 90 days supply | Qty: 60 | Fill #2

## 2015-11-25 MED FILL — CONTOUR NEXT STRIPS: 90 days supply | Qty: 400 | Fill #1

## 2015-11-25 MED FILL — ATORVASTATIN 20 MG TABLET: 20 | 90 days supply | Qty: 90 | Fill #1

## 2015-11-25 MED FILL — LISINOPRIL 20 MG TABLET: 20 | 90 days supply | Qty: 180 | Fill #1

## 2015-12-02 DIAGNOSIS — E1065 Type 1 diabetes mellitus with hyperglycemia: Secondary | ICD-10-CM | POA: Diagnosis not present

## 2015-12-23 ENCOUNTER — Ambulatory Visit: Payer: Self-pay

## 2016-01-22 DIAGNOSIS — E109 Type 1 diabetes mellitus without complications: Secondary | ICD-10-CM | POA: Diagnosis not present

## 2016-01-22 DIAGNOSIS — E7800 Pure hypercholesterolemia, unspecified: Secondary | ICD-10-CM | POA: Diagnosis not present

## 2016-01-22 DIAGNOSIS — I10 Essential (primary) hypertension: Secondary | ICD-10-CM | POA: Diagnosis not present

## 2016-01-22 DIAGNOSIS — E291 Testicular hypofunction: Secondary | ICD-10-CM | POA: Diagnosis not present

## 2016-01-22 DIAGNOSIS — G609 Hereditary and idiopathic neuropathy, unspecified: Secondary | ICD-10-CM | POA: Diagnosis not present

## 2016-02-23 DIAGNOSIS — E1065 Type 1 diabetes mellitus with hyperglycemia: Secondary | ICD-10-CM | POA: Diagnosis not present

## 2016-02-24 ENCOUNTER — Ambulatory Visit: Payer: Self-pay

## 2016-03-08 MED FILL — VIAGRA 100 MG TABLET: 100 | 60 days supply | Qty: 12 | Fill #0

## 2016-03-08 MED FILL — ATORVASTATIN 20 MG TABLET: 20 | 90 days supply | Qty: 90 | Fill #0

## 2016-03-08 MED FILL — HumaLOG 100 UNIT/ML SOLN: 100 | 90 days supply | Qty: 60 | Fill #3

## 2016-03-08 MED FILL — LISINOPRIL 20 MG TABLET: 20 | 90 days supply | Qty: 180 | Fill #0

## 2016-04-20 MED FILL — CONTOUR NEXT STRIPS: 90 days supply | Qty: 400 | Fill #1

## 2016-05-19 DIAGNOSIS — M545 Low back pain: Secondary | ICD-10-CM | POA: Diagnosis not present

## 2016-05-20 ENCOUNTER — Ambulatory Visit: Payer: Self-pay

## 2016-05-24 DIAGNOSIS — E1065 Type 1 diabetes mellitus with hyperglycemia: Secondary | ICD-10-CM | POA: Diagnosis not present

## 2016-06-17 MED FILL — LISINOPRIL 20 MG TABLET: 20 | 90 days supply | Qty: 180 | Fill #1

## 2016-06-17 MED FILL — HumaLOG 100 UNIT/ML SOLN: 100 | 90 days supply | Qty: 90 | Fill #0

## 2016-06-17 MED FILL — ATORVASTATIN 20 MG TABLET: 20 | 90 days supply | Qty: 90 | Fill #1

## 2016-06-30 DIAGNOSIS — H52223 Regular astigmatism, bilateral: Secondary | ICD-10-CM | POA: Diagnosis not present

## 2016-07-27 ENCOUNTER — Other Ambulatory Visit: Payer: Self-pay

## 2016-07-27 VITALS — BP 158/90 | HR 92 | Resp 14 | Ht 72.0 in | Wt 253.0 lb

## 2016-07-27 DIAGNOSIS — E109 Type 1 diabetes mellitus without complications: Secondary | ICD-10-CM

## 2016-07-27 NOTE — Patient Outreach (Signed)
[facility]) Care Management   07/27/2016  [name] 10-27-1970 [id number]  [name] is an 45 y.o. male.   Member seen for follow up office visit for Link to Wellness program for self management of Type 1 diabetes  Subjective:  Member states that he has had back problems and he had steroid injections that made his blood sugars go up.  States that he let Dr.Balan know and he made sure he checked his blood sugars more and took his correction doses.  States that his hemoglobin A1C was 9.8% when it was last checked.  States that he is to see her again on 08/17/16.  States that his weight went up while he was on steroids  because he ate more.  States he knows what to eat and how to count the CHO but he serves himself larger portions.  Objective:   Review of Systems  Musculoskeletal: Positive for back pain and neck pain.    Physical Exam Today's Vitals   07/27/16 1556 07/27/16 1607  BP: (!) 158/90   Pulse: 92   Resp: 14   SpO2: 97%   Weight: 253 lb (114.8 kg)   Height: 1.829 m (6')   PainSc: 0-No pain 0-No pain   Encounter Medications:   Outpatient Encounter Prescriptions as of 07/27/2016  Medication Sig Note  . aspirin EC 81 MG tablet Take 81 mg by mouth daily. 11/03/2015: Taking daily  . atorvastatin (LIPITOR) 40 MG tablet Take 40 mg by mouth daily.   . calcium carbonate (TUMS - DOSED IN MG ELEMENTAL CALCIUM) 500 MG chewable tablet Chew 1 tablet by mouth 3 (three) times daily as needed for indigestion or heartburn. Reported on 11/03/2015   . ibuprofen (ADVIL,MOTRIN) 200 MG tablet Take 600 mg by mouth every 6 (six) hours as needed for headache, mild pain or moderate pain. Reported on 09/25/2015   . Insulin Human (INSULIN PUMP) SOLN by Continuous infusion (non-IV) route. Humalog 09/07/2015: Insulin pump dosed by sliding scale Humalog 100units/ml  . lisinopril (PRINIVIL,ZESTRIL) 20 MG tablet Take 20 mg by mouth daily.    . metoCLOPramide (REGLAN) 5 MG  tablet Take 1 tablet (5 mg total) by mouth every 8 (eight) hours as needed for nausea or vomiting.   . sildenafil (VIAGRA) 100 MG tablet Take 100 mg by mouth daily as needed for erectile dysfunction.   Marland Kitchen amoxicillin-clavulanate (AUGMENTIN) 875-125 MG tablet Take 1 tablet by mouth 2 (two) times daily. (Patient not taking: Reported on 07/27/2016)   . oxyCODONE-acetaminophen (PERCOCET/ROXICET) 5-325 MG tablet Take 2 tablets by mouth every 6 (six) hours as needed for severe pain. (Patient not taking: Reported on 07/27/2016)   . silver sulfADIAZINE (SILVADENE) 1 % cream Apply 1 application topically daily. (Patient not taking: Reported on 07/27/2016)    No facility-administered encounter medications on file as of 07/27/2016.     Functional Status:   In your present state of health, do you have any difficulty performing the following activities: 07/27/2016 11/03/2015  Hearing? N N  Vision? N N  Difficulty concentrating or making decisions? N N  Walking or climbing stairs? N N  Dressing or bathing? N N  Doing errands, shopping? N N  Some recent data might be hidden    Fall/Depression Screening:    PHQ 2/9 Scores 07/27/2016 11/03/2015 09/25/2015 06/05/2015  PHQ - 2 Score 0 0 0 1    Assessment:  Member seen for follow up office visit for Link to Wellness program for self management  of Type 1 diabetes. Member not meeting diabetes self management goal of 7% or below with last result of 9.8%.  Reports that he has not been watching diet or exercising regularly while having back problems. Reports his blood sugars are ranging from 110-225. Member does not have primary care provider but is followed by endocrinologist regularly. Member to see endocrinologist in November.  Plan:   Plan to eat 4-5(60-75gm) servings of carbohydrates a meal 15 gm for snacks.  Plan to eat snack of protein and carbohydrate in the afternoon Plan to check blood sugar before eating and remember to bolus insulin.  Plan to look  into getting personal trainer Plan to walk 3 times a week for 60 minutes Plan to find a primary care provider and schedule an appointment Plan to see Dr. Chalmers Cater in November Plan to see Link to Wellness on 11/02/16  at [place] Problem One   Flowsheet Row Most Recent Value  Care Plan Problem One  Elevated blood sugars as evidenced by hemoglobin A1C of 12.5% related to dx of Type 1 DM  Role Documenting the Problem One  Care Management [place] for Problem One  Active  THN Long Term Goal (31-90 days)  Member will decrease hemoglobin A1C by one point within the next 90 days  THN Long Term Goal Start Date  07/27/16 [Last hemoglobin A1C 9.8%]  Interventions for Problem One Long Term Goal  Reviewed CHO counting and the importance of watching portion sizes, Encouraged to discuss with Dr.Balan about getting upgrade to the new [employer] 670 G pump and sensor, Given written instruction on personal training available at [facility], Reinforced importance of keeping his blood sugars  under control to help decrease long term complications, Reinforced on how steroids can increase his blood sugars, Encouraged to try to lose weight,    [name] Garter RN, [facility] Care Management Coordinator-Link to [place] Management [phone]

## 2016-07-28 NOTE — Patient Instructions (Signed)
1. Plan to eat 4-5(60-75gm) servings of carbohydrates a meal 15 gm for snacks.  Plan to eat snack of protein and carbohydrate in the afternoon 2. Plan to check blood sugar before eating and remember to bolus insulin.  3. Plan to look into getting personal trainer 4. Plan to walk 3 times a week for 60 minutes 5. Plan to find a primary care provider and schedule an appointment 6. Plan to see Dr. Chalmers Cater in November 7. Plan to see Link to Wellness on 11/02/16  at [facility]

## 2016-08-31 DIAGNOSIS — E1065 Type 1 diabetes mellitus with hyperglycemia: Secondary | ICD-10-CM | POA: Diagnosis not present

## 2016-09-17 DIAGNOSIS — E291 Testicular hypofunction: Secondary | ICD-10-CM | POA: Diagnosis not present

## 2016-09-17 DIAGNOSIS — E109 Type 1 diabetes mellitus without complications: Secondary | ICD-10-CM | POA: Diagnosis not present

## 2016-09-17 DIAGNOSIS — E7800 Pure hypercholesterolemia, unspecified: Secondary | ICD-10-CM | POA: Diagnosis not present

## 2016-09-24 DIAGNOSIS — E109 Type 1 diabetes mellitus without complications: Secondary | ICD-10-CM | POA: Diagnosis not present

## 2016-09-24 DIAGNOSIS — E7800 Pure hypercholesterolemia, unspecified: Secondary | ICD-10-CM | POA: Diagnosis not present

## 2016-09-24 DIAGNOSIS — G609 Hereditary and idiopathic neuropathy, unspecified: Secondary | ICD-10-CM | POA: Diagnosis not present

## 2016-09-24 DIAGNOSIS — I10 Essential (primary) hypertension: Secondary | ICD-10-CM | POA: Diagnosis not present

## 2016-10-01 DIAGNOSIS — M9904 Segmental and somatic dysfunction of sacral region: Secondary | ICD-10-CM | POA: Diagnosis not present

## 2016-10-01 DIAGNOSIS — M545 Low back pain: Secondary | ICD-10-CM | POA: Diagnosis not present

## 2016-10-01 DIAGNOSIS — M9905 Segmental and somatic dysfunction of pelvic region: Secondary | ICD-10-CM | POA: Diagnosis not present

## 2016-10-01 DIAGNOSIS — M9903 Segmental and somatic dysfunction of lumbar region: Secondary | ICD-10-CM | POA: Diagnosis not present

## 2016-10-05 DIAGNOSIS — M9903 Segmental and somatic dysfunction of lumbar region: Secondary | ICD-10-CM | POA: Diagnosis not present

## 2016-10-05 DIAGNOSIS — M545 Low back pain: Secondary | ICD-10-CM | POA: Diagnosis not present

## 2016-10-05 DIAGNOSIS — M9904 Segmental and somatic dysfunction of sacral region: Secondary | ICD-10-CM | POA: Diagnosis not present

## 2016-10-05 DIAGNOSIS — M9905 Segmental and somatic dysfunction of pelvic region: Secondary | ICD-10-CM | POA: Diagnosis not present

## 2016-11-02 ENCOUNTER — Ambulatory Visit: Payer: Self-pay

## 2016-11-11 MED FILL — ATORVASTATIN 20 MG TABLET: 20 | 90 days supply | Qty: 90 | Fill #2

## 2016-11-11 MED FILL — HumaLOG 100 UNIT/ML SOLN: 100 | 90 days supply | Qty: 90 | Fill #0 | Status: TO

## 2016-11-11 MED FILL — LISINOPRIL 20 MG TABLET: 20 | 90 days supply | Qty: 180 | Fill #2

## 2016-12-23 MED FILL — CONTOUR NEXT STRIPS: 90 days supply | Qty: 400 | Fill #0 | Status: TO

## 2017-01-14 ENCOUNTER — Ambulatory Visit: Payer: Self-pay | Admitting: Registered Nurse

## 2017-01-14 VITALS — BP 170/90 | HR 100 | Temp 98.9°F

## 2017-01-14 DIAGNOSIS — J0100 Acute maxillary sinusitis, unspecified: Secondary | ICD-10-CM

## 2017-01-14 DIAGNOSIS — R69 Illness, unspecified: Secondary | ICD-10-CM

## 2017-01-14 DIAGNOSIS — H6593 Unspecified nonsuppurative otitis media, bilateral: Secondary | ICD-10-CM

## 2017-01-14 DIAGNOSIS — J111 Influenza due to unidentified influenza virus with other respiratory manifestations: Secondary | ICD-10-CM

## 2017-01-14 DIAGNOSIS — J209 Acute bronchitis, unspecified: Secondary | ICD-10-CM

## 2017-01-14 MED ORDER — OSELTAMIVIR PHOSPHATE 75 MG PO CAPS: 75.0000 mg | ORAL_CAPSULE | Freq: Two times a day (BID) | ORAL | 0 refills | Status: DC

## 2017-01-14 MED ORDER — ALBUTEROL SULFATE HFA 108 (90 BASE) MCG/ACT IN AERS: 1.0000 | INHALATION_SPRAY | RESPIRATORY_TRACT | 0 refills | Status: DC | PRN

## 2017-01-14 MED ORDER — OSELTAMIVIR PHOSPHATE 75 MG PO CAPS: 75.0000 mg | ORAL_CAPSULE | Freq: Two times a day (BID) | ORAL | 0 refills | Status: AC

## 2017-01-14 MED ORDER — BENZONATATE 200 MG PO CAPS: 200.0000 mg | ORAL_CAPSULE | Freq: Three times a day (TID) | ORAL | 0 refills | Status: AC | PRN

## 2017-01-14 MED ORDER — AMOXICILLIN-POT CLAVULANATE 875-125 MG PO TABS: 1.0000 | ORAL_TABLET | Freq: Two times a day (BID) | ORAL | 0 refills | Status: AC

## 2017-01-14 MED ORDER — SALINE SPRAY 0.65 % NA SOLN: 2.0000 | NASAL | 0 refills | Status: DC

## 2017-01-14 MED ORDER — BENZONATATE 200 MG PO CAPS: 200.0000 mg | ORAL_CAPSULE | Freq: Three times a day (TID) | ORAL | 0 refills | Status: DC | PRN

## 2017-01-14 MED ORDER — AMOXICILLIN-POT CLAVULANATE 875-125 MG PO TABS: 1.0000 | ORAL_TABLET | Freq: Two times a day (BID) | ORAL | 0 refills | Status: DC

## 2017-01-14 NOTE — Progress Notes (Signed)
170/90  

## 2017-01-14 NOTE — Patient Instructions (Addendum)
24 hour work excuse given 01/14/2017 at 1442 to patient and he is to notify supervisor augmentin 875mg  by mouth twice a day x 10 days Nasal saline 2 spray each nostril every 2 hours while awake Tessalon pearles 200mg  by mouth every 8 hours as needed for cough Albuterol inhaler 1-2 puffs every 4-6 hours as needed for chest tightness, protracted cough/wheezing flonase 1 spray each nostril twice a day Shower twice a day Rest Hydrate  Tonsillitis Tonsillitis is an infection of the throat. This infection causes the tonsils to become red, tender, and swollen. Tonsils are tissues in the back of your throat. If bacteria caused your infection, antibiotic medicine will be given to you. Sometimes, symptoms of this infection can be helped with the use of steroid medicine. If your tonsillitis is very bad (severe) and happens often, you may need to get your tonsils removed (tonsillectomy). Follow these instructions at home: Medicines   Take over-the-counter and prescription medicines only as told by your doctor.  If you were prescribed an antibiotic, take it as told by your doctor. Do not stop taking the antibiotic even if you start to feel better. Eating and drinking   Drink enough fluid to keep your pee (urine) clear or pale yellow.  While your throat is sore, eat soft or liquid foods like:  Soup.  [place]bert.  Instant breakfast drinks.  Drink warm fluids.  Eat frozen ice pops. General instructions   Rest as much as possible and get plenty of sleep.  Gargle with a salt-water mixture 3-4 times a day or as needed. To make a salt-water mixture, completely dissolve -1 tsp of salt in 1 cup of warm water.  Wash your hands often with soap and water. If there is no soap and water, use hand sanitizer.  Do not share cups, bottles, or other utensils until your symptoms are gone.  Do not smoke. If you need help quitting, ask your doctor.  Keep all follow-up visits as told by your doctor. This is  important. Contact a doctor if:  You have large, tender lumps in your neck.  You have a fever that does not go away after 2-3 days.  You have a rash.  You cough up green, yellow-brown, or bloody fluid.  You cannot swallow liquids or food for 24 hours.  Only one of your tonsils is swollen. Get help right away if:  You have any new symptoms such as:  Vomiting  Very bad headache  Stiff neck  Chest pain  Trouble breathing or swallowing  You have very bad throat pain and also have drooling or voice changes.  You have very bad pain that is not helped by medicine.  You cannot fully open your mouth.  You have redness, swelling, or severe pain anywhere in your neck. Summary  Tonsillitis causes your tonsils to be red, tender, and swollen.  While your throat is sore eat soft or liquid foods.  Gargle with a salt-water mixture 3-4 times a day or as needed.  Do not share cups, bottles, or other utensils until your symptoms are gone. This information is not intended to replace advice given to you by your health care provider. Make sure you discuss any questions you have with your health care provider. Document Released: 03/08/2008 Document Revised: 02/26/2016 Document Reviewed: 03/09/2013 Elsevier Interactive Patient Education  2017 [employer]  Sinusitis, Adult Sinusitis is soreness and inflammation of your sinuses. Sinuses are hollow spaces in the bones around your face. They are located:  Around your eyes.  In the middle of your forehead.  Behind your nose.  In your cheekbones. Your sinuses and nasal passages are lined with a stringy fluid (mucus). Mucus normally drains out of your sinuses. When your nasal tissues get inflamed or swollen, the mucus can get trapped or blocked so air cannot flow through your sinuses. This lets bacteria, viruses, and funguses grow, and that leads to infection. Follow these instructions at home: Medicines   Take, use, or apply  over-the-counter and prescription medicines only as told by your doctor. These may include nasal sprays.  If you were prescribed an antibiotic medicine, take it as told by your doctor. Do not stop taking the antibiotic even if you start to feel better. Hydrate and Humidify   Drink enough water to keep your pee (urine) clear or pale yellow.  Use a cool mist humidifier to keep the humidity level in your home above 50%.  Breathe in steam for 10-15 minutes, 3-4 times a day or as told by your doctor. You can do this in the bathroom while a hot shower is running.  Try not to spend time in cool or dry air. Rest   Rest as much as possible.  Sleep with your head raised (elevated).  Make sure to get enough sleep each night. General instructions   Put a warm, moist washcloth on your face 3-4 times a day or as told by your doctor. This will help with discomfort.  Wash your hands often with soap and water. If there is no soap and water, use hand sanitizer.  Do not smoke. Avoid being around people who are smoking (secondhand smoke).  Keep all follow-up visits as told by your doctor. This is important. Contact a doctor if:  You have a fever.  Your symptoms get worse.  Your symptoms do not get better within 10 days. Get help right away if:  You have a very bad headache.  You cannot stop throwing up (vomiting).  You have pain or swelling around your face or eyes.  You have trouble seeing.  You feel confused.  Your neck is stiff.  You have trouble breathing. This information is not intended to replace advice given to you by your health care provider. Make sure you discuss any questions you have with your health care provider. Document Released: 03/08/2008 Document Revised: 05/16/2016 Document Reviewed: 07/16/2015 Elsevier Interactive Patient Education  2017 [employer] Inc.  Acute Bronchitis, Adult Acute bronchitis is when air tubes (bronchi) in the lungs suddenly get swollen. The  condition can make it hard to breathe. It can also cause these symptoms:  A cough.  Coughing up clear, yellow, or green mucus.  Wheezing.  Chest congestion.  Shortness of breath.  A fever.  Body aches.  Chills.  A sore throat. Follow these instructions at home: Medicines   Take over-the-counter and prescription medicines only as told by your doctor.  If you were prescribed an antibiotic medicine, take it as told by your doctor. Do not stop taking the antibiotic even if you start to feel better. General instructions   Rest.  Drink enough fluids to keep your pee (urine) clear or pale yellow.  Avoid smoking and secondhand smoke. If you smoke and you need help quitting, ask your doctor. Quitting will help your lungs heal faster.  Use an inhaler, cool mist vaporizer, or humidifier as told by your doctor.  Keep all follow-up visits as told by your doctor. This is important. How is this prevented?  To lower your risk of getting this condition again:  Wash your hands often with soap and water. If you cannot use soap and water, use hand sanitizer.  Avoid contact with people who have cold symptoms.  Try not to touch your hands to your mouth, nose, or eyes.  Make sure to get the flu shot every year. Contact a doctor if:  Your symptoms do not get better in 2 weeks. Get help right away if:  You cough up blood.  You have chest pain.  You have very bad shortness of breath.  You become dehydrated.  You faint (pass out) or keep feeling like you are going to pass out.  You keep throwing up (vomiting).  You have a very bad headache.  Your fever or chills gets worse. This information is not intended to replace advice given to you by your health care provider. Make sure you discuss any questions you have with your health care provider. Document Released: 03/08/2008 Document Revised: 04/28/2016 Document Reviewed: 03/10/2016 Elsevier Interactive Patient Education  2017  [employer].  Influenza, Adult Influenza ("the flu") is an infection in the lungs, nose, and throat (respiratory tract). It is caused by a virus. The flu causes many common cold symptoms, as well as a high fever and body aches. It can make you feel very sick. The flu spreads easily from person to person (is contagious). Getting a flu shot (influenza vaccination) every year is the best way to prevent the flu. Follow these instructions at home:  Take over-the-counter and prescription medicines only as told by your doctor.  Use a cool mist humidifier to add moisture (humidity) to the air in your home. This can make it easier to breathe.  Rest as needed.  Drink enough fluid to keep your pee (urine) clear or pale yellow.  Cover your mouth and nose when you cough or sneeze.  Wash your hands with soap and water often, especially after you cough or sneeze. If you cannot use soap and water, use hand sanitizer.  Stay home from work or school as told by your doctor. Unless you are visiting your doctor, try to avoid leaving home until your fever has been gone for 24 hours without the use of medicine.  Keep all follow-up visits as told by your doctor. This is important. How is this prevented?  Getting a yearly (annual) flu shot is the best way to avoid getting the flu. You may get the flu shot in late summer, fall, or winter. Ask your doctor when you should get your flu shot.  Wash your hands often or use hand sanitizer often.  Avoid contact with people who are sick during cold and flu season.  Eat healthy foods.  Drink plenty of fluids.  Get enough sleep.  Exercise regularly. Contact a doctor if:  You get new symptoms.  You have:  Chest pain.  Watery poop (diarrhea).  A fever.  Your cough gets worse.  You start to have more mucus.  You feel sick to your stomach (nauseous).  You throw up (vomit). Get help right away if:  You start to be short of breath or have trouble  breathing.  Your skin or nails turn a bluish color.  You have very bad pain or stiffness in your neck.  You get a sudden headache.  You get sudden pain in your face or ear.  You cannot stop throwing up. This information is not intended to replace advice given to you by your health  care provider. Make sure you discuss any questions you have with your health care provider. Document Released: 06/29/2008 Document Revised: 02/26/2016 Document Reviewed: 07/15/2015 Elsevier Interactive Patient Education  2017 [employer].

## 2017-01-14 NOTE — Progress Notes (Signed)
Subjective:    Patient ID: [name], male    DOB: [birth year], 46 y.o.   MRN: [id number]  45y/o caucasian male diabetic on insulin pump here for cough keeping him up at night, ear aches, fatigue, coughing fits.  Patient has tried mucinex without much relief felt a little better this am but feeling worse now.  Works in [employer], clinical.  Has noticed some wheezing with cough, sore throat, body aches, chills, decreased appetite and congestion.  Cough nonproductive.  Denied hemoptysis, ear discharge, nausea, vomiting and/or diarrhea.  Blood sugars running higher than normal low 200s this week his goal to be below 150.  PMHx insulin dependent diabetes, herniated cervical disk, chronic kidney failure, tachycardia PSHx cardiac catheterization, neck fusion      Review of Systems  Constitutional: Positive for appetite change, chills and fatigue. Negative for activity change, diaphoresis, fever and unexpected weight change.  HENT: Positive for ear pain and sore throat. Negative for congestion, dental problem, drooling, ear discharge, facial swelling, hearing loss, mouth sores, nosebleeds, postnasal drip, rhinorrhea, sinus pain, sinus pressure, sneezing, tinnitus, trouble swallowing and voice change.   Eyes: Negative for photophobia, pain, discharge, redness, itching and visual disturbance.  Respiratory: Positive for cough and wheezing. Negative for choking, chest tightness, shortness of breath and stridor.   Cardiovascular: Negative for chest pain, palpitations and leg swelling.  Gastrointestinal: Negative for abdominal distention, abdominal pain, blood in stool, constipation, diarrhea, nausea and vomiting.  Endocrine: Negative for cold intolerance and heat intolerance.  Genitourinary: Negative for dysuria.  Musculoskeletal: Positive for myalgias. Negative for arthralgias, back pain, gait problem, joint swelling, neck pain and neck stiffness.  Skin: Negative for color change, pallor,  rash and wound.  Allergic/Immunologic: Positive for environmental allergies. Negative for food allergies and immunocompromised state.  Neurological: Positive for headaches. Negative for dizziness, tremors, seizures, syncope, facial asymmetry, speech difficulty, weakness, light-headedness and numbness.  Hematological: Negative for adenopathy. Does not bruise/bleed easily.  Psychiatric/Behavioral: Positive for sleep disturbance. Negative for agitation, behavioral problems and confusion.       Objective:   Physical Exam  Constitutional: He is oriented to person, place, and time. Vital signs are normal. He appears well-developed and well-nourished. He is active and cooperative.  Non-toxic appearance. He does not have a sickly appearance. He appears ill. No distress.  HENT:  Head: Normocephalic and atraumatic.  Right Ear: Hearing, external ear and ear canal normal. A middle ear effusion is present.  Left Ear: Hearing, external ear and ear canal normal. A middle ear effusion is present.  Nose: Mucosal edema and rhinorrhea present. No nose lacerations, sinus tenderness, nasal deformity, septal deviation or nasal septal hematoma. No epistaxis.  No foreign bodies. Right sinus exhibits maxillary sinus tenderness. Right sinus exhibits no frontal sinus tenderness. Left sinus exhibits maxillary sinus tenderness. Left sinus exhibits no frontal sinus tenderness.  Mouth/Throat: Uvula is midline and mucous membranes are normal. Mucous membranes are not pale, not dry and not cyanotic. He does not have dentures. No oral lesions. No trismus in the jaw. Normal dentition. No dental abscesses, uvula swelling, lacerations or dental caries. Posterior oropharyngeal edema and posterior oropharyngeal erythema present. No oropharyngeal exudate or tonsillar abscesses.  Tonsils edema cryptic 3+/4 bilaterally no exudate; cobblestoning posterior pharynx; bilateral nasal turbinates edema/erythema clear discharge; maxillary sinuses  TTP bilaterally; bilateral allergic shiners; bilateral TMs air fluid level clear  Eyes: Conjunctivae, EOM and lids are normal. Pupils are equal, round, and reactive to light. Right eye exhibits no chemosis,  no discharge, no exudate and no hordeolum. No foreign body present in the right eye. Left eye exhibits no chemosis, no discharge, no exudate and no hordeolum. No foreign body present in the left eye. Right conjunctiva is not injected. Right conjunctiva has no hemorrhage. Left conjunctiva is not injected. Left conjunctiva has no hemorrhage. No scleral icterus. Right eye exhibits normal extraocular motion and no nystagmus. Left eye exhibits normal extraocular motion and no nystagmus. Right pupil is round and reactive. Left pupil is round and reactive. Pupils are equal.  Neck: Trachea normal and normal range of motion. Neck supple. No tracheal tenderness, no spinous process tenderness and no muscular tenderness present. No neck rigidity. No tracheal deviation, no edema, no erythema and normal range of motion present. No thyroid mass and no thyromegaly present.  Cardiovascular: Normal rate, regular rhythm, S1 normal, S2 normal, normal heart sounds and intact distal pulses.  PMI is not displaced.  Exam reveals no gallop and no friction rub.   No murmur heard. Pulmonary/Chest: Effort normal and breath sounds normal. No stridor. No respiratory distress. He has no decreased breath sounds. He has no wheezes. He has no rhonchi. He has no rales.  Speaks full sentences without difficulty; intermittent dry barky cough during exam  Abdominal: Soft. Normal appearance. He exhibits no distension. There is no rigidity and no guarding.  Musculoskeletal: Normal range of motion. He exhibits no edema or tenderness.       Right shoulder: Normal.       Left shoulder: Normal.       Right elbow: Normal.      Left elbow: Normal.       Right wrist: Normal.       Left wrist: Normal.       Right hip: Normal.       Left hip:  Normal.       Right knee: Normal.       Left knee: Normal.       Right ankle: Normal.       Left ankle: Normal.       Cervical back: Normal.       Right hand: Normal.       Left hand: Normal.  Gait sure and steady in hallway; on off exam table without difficulty  Lymphadenopathy:       Head (right side): No submental, no submandibular, no tonsillar, no preauricular, no posterior auricular and no occipital adenopathy present.       Head (left side): No submental, no submandibular, no tonsillar, no preauricular, no posterior auricular and no occipital adenopathy present.    He has no cervical adenopathy.       Right cervical: No superficial cervical, no deep cervical and no posterior cervical adenopathy present.      Left cervical: No superficial cervical, no deep cervical and no posterior cervical adenopathy present.  Neurological: He is alert and oriented to person, place, and time. He has normal strength. He is not disoriented. He displays no atrophy and no tremor. No cranial nerve deficit or sensory deficit. He exhibits normal muscle tone. He displays no seizure activity. Coordination and gait normal. GCS eye subscore is 4. GCS verbal subscore is 5. GCS motor subscore is 6.  Bilateral hand grasp equal bilaterally 5/5  Skin: Skin is warm and intact. No abrasion, no bruising, no burn, no ecchymosis, no laceration, no lesion, no petechiae and no rash noted. He is not diaphoretic. No cyanosis or erythema. No pallor. Nails show no clubbing.  Skin on face moist  Psychiatric: He has a normal mood and affect. His speech is normal and behavior is normal. Judgment and thought content normal. Cognition and memory are normal.  Nursing note and vitals reviewed.         Assessment & Plan:  A-influenza like illness, acute maxillary sinusitis nonrecurrent, acute bronchitis; acute otitis media effusion bilaterally  P-Electronic Rx tamiflu 75mg  po BID x 5 days #10 RF0 and albuterol 15mcg 1-2 puffs po  q4-6h prn protracted cough, wheeze, chest tightness #1 RF0.  Hydrate hydrate hydrate.if unable to maintain po intake or urine orange/brown/unable to void every 8 hours follow up with PCM/UCC/ER for re-evaluation.  If blood sugar greater than 300 follow up for re-evaluation.  Tylenol 1000mg  po q6h prn pain/fever. Honey with lemon.  Avoid motrin/advil/aleve/naproxen due to diabetes and hypertension.  no evidence of invasive bacterial infection, non toxic and well hydrated.  This is most likely self limiting viral infection.  I do not see where any further testing or imaging is necessary at this time.   I will suggest supportive care, rest, good hygiene and encourage the patient to take adequate fluids.  Given 24 hour work excuse. Patient to talk with supervisor discussed recommend staying home until afebrile 24 hours off tylenol/motrin. nasal saline 1-2 sprays each nostril prn q2h,.  Discussed honey with lemon and salt water gargles for comfort also.  The patient is to return to clinic or EMERGENCY ROOM if symptoms worsen or change significantly e.g. Dyspnea, dysphagia, vomiting, lethargy, SOB, wheezing. Patient verbalized agreement and understanding of treatment plan and had no further questions at this time.  Supportive treatment.   No evidence of invasive bacterial infection, non toxic and well hydrated.  This is most likely self limiting viral infection.  I do not see where any further testing or imaging is necessary at this time.   I will suggest supportive care, rest, good hygiene and encourage the patient to take adequate fluids.  The patient is to return to clinic or EMERGENCY ROOM if symptoms worsen or change significantly e.g. ear pain, fever, purulent discharge from ears or bleeding.  Exitcare handout on otitis media with effusion given to patient.  Patient verbalized agreement and understanding of treatment plan.    start flonase 1 spray each nostril BID, saline 2 sprays each nostril q2h prn  congestion.  If no improvement with 48 hours of saline and flonase use start augmentin 875mg  po BID x 10 days.  Rx given.  No evidence of systemic bacterial infection, non toxic and well hydrated.  I do not see where any further testing or imaging is necessary at this time.   I will suggest supportive care, rest, good hygiene and encourage the patient to take adequate fluids.  The patient is to return to clinic or EMERGENCY ROOM if symptoms worsen or change significantly.  Exitcare handout on sinusitis given to patient.  Patient verbalized agreement and understanding of treatment plan and had no further questions at this time.   P2:  Hand washing and cover cough  Tessalon pearles 200mg  po TID prn cough #21 RF0.  Demonstrated/instructed MDI use Rx albuterol 26mcg 1-2 puffs po q4-6h prn protracted cough/wheezing #1 RF0.  Discussed augmentin for sinusitis will also cover bronchitis.  Patient has not tolerated prednisone well in the past due to his diabetes will hold at this time. Tussionex has medication interactions with chronic medications as does phenergan will hold at this time and trial of tessalon pearles.  Discussed  honey with lemon.  Hydrating. Sp02 decreased from baseline discussed this with patient.   Bronchitis simple, community acquired, may have started as viral (probably respiratory syncytial, parainfluenza, influenza, or adenovirus), but now evidence of acute purulent bronchitis with resultant bronchial edema and mucus formation.  Viruses are the most common cause of bronchial inflammation in otherwise healthy adults with acute bronchitis.  The appearance of sputum is not predictive of whether a bacterial infection is present.  Purulent sputum is most often caused by viral infections.  There are a small portion of those caused by non-viral agents being Mycoplamsa pneumonia.  Microscopic examination or C&S of sputum in the healthy adult with acute bronchitis is generally not helpful (usually negative  or normal respiratory flora) other considerations being cough from upper respiratory tract infections, sinusitis or allergic syndromes (mild asthma or viral pneumonia).  Differential Diagnosis:  reactive airway disease (asthma, allergic aspergillosis (eosinophilia), chronic bronchitis, respiratory infection (Sinusitis, Common cold, pneumonia), congestive heart failure, reflux esophagitis, bronchogenic tumor, aspiration syndromes and/or exposure irritants/tobacco smoke.  In this case, there is no evidence of any invasive bacterial illness.  Most likely viral etiology so will hold on antibiotic treatment.  Advise supportive care with rest, encourage fluids, good hygiene and watch for any worsening symptoms.  If they were to develop:  come back to the office or go to the emergency room if after hours. Without high fever, severe dyspnea, lack of physical findings or other risk factors, I will hold on a chest radiograph and CBC at this time.  I discussed that approximately 50% of patients with acute bronchitis have a cough that lasts up to three weeks, and 25% for over a month.  Tylenol, one to two tablets every four hours as needed for fever or myalgias.   No aspirin.  Patient instructed to follow up in one week or sooner if symptoms worsen. Patient verbalized agreement and understanding of treatment plan.  P2:  hand washing and cover cough

## 2017-01-17 DIAGNOSIS — J40 Bronchitis, not specified as acute or chronic: Secondary | ICD-10-CM | POA: Diagnosis not present

## 2017-01-17 DIAGNOSIS — E1010 Type 1 diabetes mellitus with ketoacidosis without coma: Secondary | ICD-10-CM | POA: Diagnosis not present

## 2017-01-17 DIAGNOSIS — Z91041 Radiographic dye allergy status: Secondary | ICD-10-CM | POA: Diagnosis not present

## 2017-01-17 DIAGNOSIS — E7800 Pure hypercholesterolemia, unspecified: Secondary | ICD-10-CM | POA: Diagnosis not present

## 2017-01-17 DIAGNOSIS — N179 Acute kidney failure, unspecified: Secondary | ICD-10-CM | POA: Diagnosis not present

## 2017-01-17 DIAGNOSIS — E162 Hypoglycemia, unspecified: Secondary | ICD-10-CM | POA: Diagnosis not present

## 2017-01-17 DIAGNOSIS — J329 Chronic sinusitis, unspecified: Secondary | ICD-10-CM | POA: Diagnosis not present

## 2017-01-17 DIAGNOSIS — E039 Hypothyroidism, unspecified: Secondary | ICD-10-CM | POA: Diagnosis not present

## 2017-01-17 DIAGNOSIS — E1310 Other specified diabetes mellitus with ketoacidosis without coma: Secondary | ICD-10-CM | POA: Diagnosis not present

## 2017-01-17 DIAGNOSIS — R531 Weakness: Secondary | ICD-10-CM | POA: Diagnosis not present

## 2017-01-17 DIAGNOSIS — Z885 Allergy status to narcotic agent status: Secondary | ICD-10-CM | POA: Diagnosis not present

## 2017-01-17 DIAGNOSIS — E10649 Type 1 diabetes mellitus with hypoglycemia without coma: Secondary | ICD-10-CM | POA: Diagnosis not present

## 2017-01-17 DIAGNOSIS — E1110 Type 2 diabetes mellitus with ketoacidosis without coma: Secondary | ICD-10-CM | POA: Diagnosis not present

## 2017-01-17 DIAGNOSIS — J019 Acute sinusitis, unspecified: Secondary | ICD-10-CM | POA: Diagnosis not present

## 2017-01-17 DIAGNOSIS — J209 Acute bronchitis, unspecified: Secondary | ICD-10-CM | POA: Diagnosis not present

## 2017-01-19 DIAGNOSIS — E162 Hypoglycemia, unspecified: Secondary | ICD-10-CM

## 2017-01-25 DIAGNOSIS — I10 Essential (primary) hypertension: Secondary | ICD-10-CM | POA: Diagnosis not present

## 2017-01-25 DIAGNOSIS — E109 Type 1 diabetes mellitus without complications: Secondary | ICD-10-CM | POA: Diagnosis not present

## 2017-01-25 DIAGNOSIS — Z9641 Presence of insulin pump (external) (internal): Secondary | ICD-10-CM | POA: Diagnosis not present

## 2017-02-03 DIAGNOSIS — L255 Unspecified contact dermatitis due to plants, except food: Secondary | ICD-10-CM | POA: Diagnosis not present

## 2017-02-03 DIAGNOSIS — E1165 Type 2 diabetes mellitus with hyperglycemia: Secondary | ICD-10-CM | POA: Diagnosis not present

## 2017-02-09 ENCOUNTER — Ambulatory Visit: Payer: Self-pay

## 2017-02-25 DIAGNOSIS — E1065 Type 1 diabetes mellitus with hyperglycemia: Secondary | ICD-10-CM | POA: Diagnosis not present

## 2017-02-25 DIAGNOSIS — Z9641 Presence of insulin pump (external) (internal): Secondary | ICD-10-CM | POA: Diagnosis not present

## 2017-02-25 DIAGNOSIS — E1040 Type 1 diabetes mellitus with diabetic neuropathy, unspecified: Secondary | ICD-10-CM | POA: Diagnosis not present

## 2017-02-25 DIAGNOSIS — E782 Mixed hyperlipidemia: Secondary | ICD-10-CM | POA: Diagnosis not present

## 2017-03-03 DIAGNOSIS — E1065 Type 1 diabetes mellitus with hyperglycemia: Secondary | ICD-10-CM | POA: Diagnosis not present

## 2017-03-08 DIAGNOSIS — E1065 Type 1 diabetes mellitus with hyperglycemia: Secondary | ICD-10-CM | POA: Diagnosis not present

## 2017-03-09 DIAGNOSIS — E1065 Type 1 diabetes mellitus with hyperglycemia: Secondary | ICD-10-CM | POA: Diagnosis not present

## 2017-03-16 ENCOUNTER — Ambulatory Visit: Payer: Self-pay

## 2017-04-11 DIAGNOSIS — E1065 Type 1 diabetes mellitus with hyperglycemia: Secondary | ICD-10-CM | POA: Diagnosis not present

## 2017-04-11 DIAGNOSIS — E1022 Type 1 diabetes mellitus with diabetic chronic kidney disease: Secondary | ICD-10-CM | POA: Diagnosis not present

## 2017-04-11 DIAGNOSIS — E1040 Type 1 diabetes mellitus with diabetic neuropathy, unspecified: Secondary | ICD-10-CM | POA: Diagnosis not present

## 2017-04-11 DIAGNOSIS — Z9641 Presence of insulin pump (external) (internal): Secondary | ICD-10-CM | POA: Diagnosis not present

## 2017-04-22 DIAGNOSIS — N183 Chronic kidney disease, stage 3 unspecified: Secondary | ICD-10-CM | POA: Insufficient documentation

## 2017-04-22 DIAGNOSIS — I10 Essential (primary) hypertension: Secondary | ICD-10-CM | POA: Diagnosis not present

## 2017-04-22 DIAGNOSIS — E7800 Pure hypercholesterolemia, unspecified: Secondary | ICD-10-CM | POA: Diagnosis not present

## 2017-04-27 ENCOUNTER — Other Ambulatory Visit: Payer: Self-pay

## 2017-04-27 VITALS — BP 138/82 | HR 91 | Resp 16 | Ht 72.0 in | Wt 248.9 lb

## 2017-04-27 DIAGNOSIS — E1040 Type 1 diabetes mellitus with diabetic neuropathy, unspecified: Secondary | ICD-10-CM

## 2017-04-27 NOTE — Patient Instructions (Signed)
1. Plan to eat 4-5(60-75gm) servings of carbohydrates a meal 15 gm for snacks.  Plan to eat snack of protein and carbohydrate in the afternoon 2. Plan to bolus insulin 30 minutes before eating and make sure to eat. 3. Plan to talk with supervisor about meal timing   4. Plan to go to gym 2 times a week 30-45 minutes 5. Plan to walk 3 times a week for 60 minutes 6. Plan to complete EMMI programs by 07/03/17 7. Plan to see Dr. Gabriel Carina on 07/05/17 8. Plan to see [facility] on 08/03/17  at [facility]

## 2017-04-27 NOTE — Patient Outreach (Signed)
[facility]) Care Management   04/27/2017  [name] 07/04/1971 [id number]  [name] is an 46 y.o. male.   Member seen for follow up office visit for Link to Wellness program for self management of Type 1 diabetes  Subjective:  Member state that he changed endocrinologist and he now has a primary care provider.  States that he now has the new 670G Metronic pump and sensor.  States he is now using it in the Auto mode.  States that he has seen a big improvement in his blood sugars since he started using the new pump.  States his hemoglobin A1C had gone up to 11.5%.  States that he now has kidney damage and that has concerned him.  States he wants to take better care of himself now.  States that he has joined the gym at [employer] but he has not gone to work out yet.  States he has had only 2 low blood sugars in the last month and his pump alarmed him of the low.  States he is still checking his blood sugar 5-6 times a day even with the sensor.  States he is feeling better now than he has felt in years.  Objective:   ROS  Physical Exam Today's Vitals   04/27/17 1508 04/27/17 1511  BP: 138/82   Pulse: 91   Resp: 16   SpO2: 97%   Weight: 248 lb 14.4 oz (112.9 kg)   Height: 1.829 m (6')   PainSc: 0-No pain 0-No pain   Encounter Medications:   Outpatient Encounter Prescriptions as of 04/27/2017  Medication Sig Note  . albuterol (PROVENTIL HFA;VENTOLIN HFA) 108 (90 Base) MCG/ACT inhaler Inhale 1-2 puffs into the lungs every 4 (four) hours as needed for wheezing or shortness of breath.   Marland Kitchen amLODipine (NORVASC) 5 MG tablet Take 5 mg by mouth daily.   Marland Kitchen aspirin EC 81 MG tablet Take 81 mg by mouth daily. 11/03/2015: Taking daily  . atorvastatin (LIPITOR) 40 MG tablet Take 40 mg by mouth daily.   Marland Kitchen BAYER CONTOUR NEXT TEST test strip Inject 1 strip into the skin 4 (four) times daily as needed.   . calcium carbonate (TUMS - DOSED IN MG ELEMENTAL CALCIUM) 500 MG  chewable tablet Chew 1 tablet by mouth 3 (three) times daily as needed for indigestion or heartburn. Reported on 11/03/2015   . HUMALOG 100 UNIT/ML injection Inject 100 Units into the skin daily. 01/14/2017: Insulin pump continuous infusion  . hydrochlorothiazide (HYDRODIURIL) 12.5 MG tablet Take 12.5 mg by mouth daily.   Marland Kitchen lisinopril (PRINIVIL,ZESTRIL) 20 MG tablet Take 20 mg by mouth 2 (two) times daily.    . sildenafil (VIAGRA) 100 MG tablet Take 100 mg by mouth daily as needed for erectile dysfunction.   Marland Kitchen ibuprofen (ADVIL,MOTRIN) 200 MG tablet Take 600 mg by mouth every 6 (six) hours as needed for headache, mild pain or moderate pain. Reported on 09/25/2015   . Insulin Human (INSULIN PUMP) SOLN by Continuous infusion (non-IV) route. Humalog 09/07/2015: Insulin pump dosed by sliding scale Humalog 100units/ml  . sodium chloride (OCEAN) 0.65 % SOLN nasal spray Place 2 sprays into both nostrils every 2 (two) hours while awake.    No facility-administered encounter medications on file as of 04/27/2017.     Functional Status:   In your present state of health, do you have any difficulty performing the following activities: 07/27/2016  Hearing? N  Vision? N  Difficulty concentrating or making decisions?  N  Walking or climbing stairs? N  Dressing or bathing? N  Doing errands, shopping? N  Some recent data might be hidden    Fall/Depression Screening:    Fall Risk  04/27/2017 07/27/2016 11/03/2015  Falls in the past year? No No No   PHQ 2/9 Scores 04/27/2017 07/27/2016 11/03/2015 09/25/2015 06/05/2015  PHQ - 2 Score 0 0 0 0 1    Assessment:  Member seen for follow up office visit for Link to Wellness program for self management of Type 1 diabetes. Member not meeting diabetes self management goal of 7% or below with last result of 11.5%. Member now has new endocrinologist and primary care provider.  Member started on 670G Medtronic insulin pump with Guardian sensor and is using in Auto Mode.  Blood  sugars have improved and ranging 110-130 in the AM.  Member reports improved adherence with diet but has difficulty taking bolus before eating due to work issues.  Member up to date with annual eye exam and dental check ups. Plan:  Plan to eat 4-5(60-75gm) servings of carbohydrates a meal 15 gm for snacks.  Plan to eat snack of protein and carbohydrate in the afternoon Plan to bolus insulin 30 minutes before eating and make sure to eat. Plan to talk with supervisor about meal timing   Plan to go to gym 2 times a week 30-45 minutes Plan to walk 3 times a week for 60 minutes Plan to complete EMMI programs by 07/03/17 Plan to see Dr. Gabriel Carina on 07/05/17 Plan to see Link to Wellness on 08/03/17  at 3 PM  [facility] CM Care Plan Problem One     Most Recent Value  Care Plan Problem One  Elevated blood sugars as evidenced by hemoglobin A1C of 12.5% related to dx of Type 1 DM  Role Documenting the Problem One  Care Management [place] for Problem One  Active  [facility] Long Term Goal   Member will decrease hemoglobin A1C to 7% or below in the next 90 days  [facility] Long Term Goal Start Date  04/27/17 Maryjane Hurter hemoglobin A1C 11.5%]  Interventions for Problem One Long Term Goal  Prasied for making changes in diet and using his pump in Auto mode.  Praised for getting a primary care provider and having a physical, Reviewed CHO counting and the importance of watching portion sizes, Encourged to take his bolus 30 minutes before eating as directed by his endocrinologist, Reinforced importance of keeping his blood sugars  under control to help him with  long term complications, Encouraged to go to gym at [place] and reinforced importance of regular exercise,  Encouraged to try to lose weight,    [name] Garter RN, [facility] Care Management Coordinator-Link to [place] Management [phone]

## 2017-05-31 DIAGNOSIS — E1065 Type 1 diabetes mellitus with hyperglycemia: Secondary | ICD-10-CM | POA: Diagnosis not present

## 2017-06-28 DIAGNOSIS — E7800 Pure hypercholesterolemia, unspecified: Secondary | ICD-10-CM | POA: Diagnosis not present

## 2017-06-28 DIAGNOSIS — I10 Essential (primary) hypertension: Secondary | ICD-10-CM | POA: Diagnosis not present

## 2017-06-28 DIAGNOSIS — E1065 Type 1 diabetes mellitus with hyperglycemia: Secondary | ICD-10-CM | POA: Diagnosis not present

## 2017-06-28 DIAGNOSIS — Z8639 Personal history of other endocrine, nutritional and metabolic disease: Secondary | ICD-10-CM | POA: Diagnosis not present

## 2017-06-28 DIAGNOSIS — N529 Male erectile dysfunction, unspecified: Secondary | ICD-10-CM | POA: Diagnosis not present

## 2017-06-28 DIAGNOSIS — N183 Chronic kidney disease, stage 3 (moderate): Secondary | ICD-10-CM | POA: Diagnosis not present

## 2017-06-29 ENCOUNTER — Other Ambulatory Visit: Payer: Self-pay

## 2017-06-30 DIAGNOSIS — E875 Hyperkalemia: Secondary | ICD-10-CM | POA: Diagnosis not present

## 2017-07-04 DIAGNOSIS — E875 Hyperkalemia: Secondary | ICD-10-CM | POA: Diagnosis not present

## 2017-07-04 DIAGNOSIS — N183 Chronic kidney disease, stage 3 (moderate): Secondary | ICD-10-CM | POA: Diagnosis not present

## 2017-07-05 DIAGNOSIS — E1040 Type 1 diabetes mellitus with diabetic neuropathy, unspecified: Secondary | ICD-10-CM | POA: Diagnosis not present

## 2017-07-05 DIAGNOSIS — E1021 Type 1 diabetes mellitus with diabetic nephropathy: Secondary | ICD-10-CM | POA: Diagnosis not present

## 2017-07-05 DIAGNOSIS — E1022 Type 1 diabetes mellitus with diabetic chronic kidney disease: Secondary | ICD-10-CM | POA: Diagnosis not present

## 2017-07-05 DIAGNOSIS — Z9641 Presence of insulin pump (external) (internal): Secondary | ICD-10-CM | POA: Diagnosis not present

## 2017-07-07 DIAGNOSIS — E1022 Type 1 diabetes mellitus with diabetic chronic kidney disease: Secondary | ICD-10-CM | POA: Diagnosis not present

## 2017-07-07 DIAGNOSIS — E875 Hyperkalemia: Secondary | ICD-10-CM | POA: Diagnosis not present

## 2017-07-07 DIAGNOSIS — N183 Chronic kidney disease, stage 3 (moderate): Secondary | ICD-10-CM | POA: Diagnosis not present

## 2017-07-07 DIAGNOSIS — R809 Proteinuria, unspecified: Secondary | ICD-10-CM | POA: Diagnosis not present

## 2017-07-07 DIAGNOSIS — I10 Essential (primary) hypertension: Secondary | ICD-10-CM | POA: Diagnosis not present

## 2017-07-08 ENCOUNTER — Other Ambulatory Visit: Payer: Self-pay | Admitting: Nephrology

## 2017-07-08 DIAGNOSIS — N183 Chronic kidney disease, stage 3 unspecified: Secondary | ICD-10-CM

## 2017-07-13 ENCOUNTER — Ambulatory Visit
Admission: RE | Admit: 2017-07-13 | Discharge: 2017-07-13 | Disposition: A | Discharge status: Home / Self Care | Payer: 59 | Source: Ambulatory Visit | Attending: Nephrology | Admitting: Nephrology

## 2017-07-13 DIAGNOSIS — N183 Chronic kidney disease, stage 3 unspecified: Secondary | ICD-10-CM

## 2017-07-13 DIAGNOSIS — D1803 Hemangioma of intra-abdominal structures: Secondary | ICD-10-CM | POA: Diagnosis not present

## 2017-07-14 ENCOUNTER — Ambulatory Visit: Admission: RE | Admit: 2017-07-14 | Payer: 59 | Source: Ambulatory Visit

## 2017-07-26 DIAGNOSIS — N183 Chronic kidney disease, stage 3 (moderate): Secondary | ICD-10-CM | POA: Diagnosis not present

## 2017-07-26 DIAGNOSIS — E7800 Pure hypercholesterolemia, unspecified: Secondary | ICD-10-CM | POA: Diagnosis not present

## 2017-07-26 DIAGNOSIS — I10 Essential (primary) hypertension: Secondary | ICD-10-CM | POA: Diagnosis not present

## 2017-07-27 ENCOUNTER — Encounter: Payer: 59 | Attending: Nephrology | Admitting: Dietician

## 2017-07-27 ENCOUNTER — Encounter: Payer: Self-pay | Admitting: Dietician

## 2017-07-27 VITALS — Ht 71.0 in | Wt 251.2 lb

## 2017-07-27 DIAGNOSIS — Z6835 Body mass index (BMI) 35.0-35.9, adult: Secondary | ICD-10-CM

## 2017-07-27 DIAGNOSIS — N183 Chronic kidney disease, stage 3 unspecified: Secondary | ICD-10-CM

## 2017-07-27 DIAGNOSIS — Z9641 Presence of insulin pump (external) (internal): Secondary | ICD-10-CM | POA: Insufficient documentation

## 2017-07-27 DIAGNOSIS — Z6834 Body mass index (BMI) 34.0-34.9, adult: Secondary | ICD-10-CM | POA: Insufficient documentation

## 2017-07-27 DIAGNOSIS — Z713 Dietary counseling and surveillance: Secondary | ICD-10-CM | POA: Diagnosis not present

## 2017-07-27 DIAGNOSIS — Z79899 Other long term (current) drug therapy: Secondary | ICD-10-CM | POA: Insufficient documentation

## 2017-07-27 DIAGNOSIS — E875 Hyperkalemia: Secondary | ICD-10-CM | POA: Diagnosis not present

## 2017-07-27 DIAGNOSIS — I129 Hypertensive chronic kidney disease with stage 1 through stage 4 chronic kidney disease, or unspecified chronic kidney disease: Secondary | ICD-10-CM | POA: Insufficient documentation

## 2017-07-27 DIAGNOSIS — E1022 Type 1 diabetes mellitus with diabetic chronic kidney disease: Secondary | ICD-10-CM | POA: Diagnosis not present

## 2017-07-27 DIAGNOSIS — Z91013 Allergy to seafood: Secondary | ICD-10-CM | POA: Insufficient documentation

## 2017-07-27 DIAGNOSIS — E109 Type 1 diabetes mellitus without complications: Secondary | ICD-10-CM | POA: Diagnosis present

## 2017-07-27 NOTE — Progress Notes (Signed)
Medical Nutrition Therapy: Visit start time: 0900  end time: 1020  Assessment:  Diagnosis: Type 1 Diabetes, CKD Stage III Past medical history: HTN, HLD Psychosocial issues/ stress concerns: patient reports high stress level Preferred learning method:  . Auditory . Visual . Hands-on  Current weight: 251.1lbs  Height: 5'11" Medications, supplements: reconciled list in medical record  Progress and evaluation: Patient reports good BG control recently with insulin pump; he is carb counting and using 8:1 and 6:1 ratios for bolus insulin after meals, depending on the time of day. He voices concern over his kidney disease particularly since having elevated potassium at last lab report. He had labwork done yesterday, and we checked results from [facility] -- potassium now at 4.8. GFR is now 44. He has been strictly limiting potassium intake as well as sodium. He has also consumed very little carbohydrate in effort to limit potassium and control BGs. He seeks help in knowing what he can eat and how to coordinate diabetic and renal diets.   Physical activity: no structured activity  Dietary Intake:  Usual eating pattern includes 3 meals and 1 snacks per day. Dining out frequency: 0 meals per week.  Breakfast: today 3 scrambled eggs, water. Was eating whole grain bread, oatmeal until potassium increased Snack: none Lunch: grilled chicken, zucchini and squash, water Snack: low potassium granola bar Supper: chicken diced with pineapple and chili sauce  Snack: none Beverages: water, some unsweetened tea with sweet n low; recently stopped diet mt dew  Nutrition Care Education: Topics covered: diabetes, low potassium and sodium for renal disease, weight control Basic nutrition: basic food groups, appropriate nutrient balance Weight control: benefits of weight control, determined energy needs for weight loss at 1700kcal Diabetes: Carb counting, appropriate carb intake and balance including portion  control and healthy carb choices for diabetes and renal health Renal diet: Advised potassium intake of 2400mg  or less daily and sodium intake of 2000mg  or less daily. Discussed options for low potassium and low sodium foods that would still allow healthy nutrient balance. Discussed possiblity of phosphorus restriction if phosphorus levels become elevated. Instructed on daily protein needs and acceptable level of 1g/kg body weight, or 115g daily from all sources; with goal of 60g from high biological value proteins.    Nutritional Diagnosis:  [place]-2.2 Altered nutrition-related laboratory As related to diabetes, CKD.  As evidenced by GFR 44, HbA1C 7.4%. [place]-3.3 Overweight/obesity As related to excess calories, limited activity.  As evidenced by BMI 35, patient report.  Intervention: Instruction as noted above.   Patient feels he will need to reduce meat portions for weight loss and avoid excessive intake for CKD.   Set goals with direction from patient.    No follow-up scheduled at this time, he will schedule later if needed.  Education Materials given:  . Food lists/ Planning A Balanced Meal . Sample meal pattern . Low Potassium Diet booklet  Renal Diet Food Pyramid/ list of high potassium, high sodium foods ([place]) . Goals/ instructions  Learner/ who was taught:  . Patient  . Spouse/ partner  Level of understanding: Marland Kitchen Verbalizes/ demonstrates competency  Demonstrated degree of understanding via:   Teach back Learning barriers: . None  Willingness to learn/ readiness for change: . Eager, change in progress  Monitoring and Evaluation:  Dietary intake, exercise, BG control and renal function, and body weight      follow up: prn

## 2017-07-27 NOTE — Patient Instructions (Signed)
   Control portions of meats, aim for 9oz (or equivalents) daily from protein foods.  Include at least 3 servings of carbohydrate foods, or 45grams, with meals, but no more than 60grams.  OK to have whole grain foods unless phosphorus levels become high.

## 2017-08-02 DIAGNOSIS — N183 Chronic kidney disease, stage 3 (moderate): Secondary | ICD-10-CM | POA: Diagnosis not present

## 2017-08-02 DIAGNOSIS — Z6835 Body mass index (BMI) 35.0-35.9, adult: Secondary | ICD-10-CM

## 2017-08-02 DIAGNOSIS — E66812 Obesity, class 2: Secondary | ICD-10-CM | POA: Insufficient documentation

## 2017-08-02 DIAGNOSIS — I10 Essential (primary) hypertension: Secondary | ICD-10-CM | POA: Diagnosis not present

## 2017-08-02 DIAGNOSIS — E7800 Pure hypercholesterolemia, unspecified: Secondary | ICD-10-CM | POA: Diagnosis not present

## 2017-08-03 ENCOUNTER — Other Ambulatory Visit: Payer: Self-pay

## 2017-08-03 VITALS — BP 140/80 | HR 89 | Resp 16 | Ht 71.0 in | Wt 254.9 lb

## 2017-08-03 DIAGNOSIS — E1040 Type 1 diabetes mellitus with diabetic neuropathy, unspecified: Secondary | ICD-10-CM

## 2017-08-03 NOTE — Patient Outreach (Signed)
[facility]) Care Management   08/03/2017  [name] November 07, 1970 [id number]  [name] is an 46 y.o. male.  Member seen for follow up office visit for Link to Wellness program for self management of Type 1 diabetes  Subjective:  Member states that he now has a diagnosis of stage 3 renal failure.  States his potassium had been high.  States he is seeing a kidney specialist now.  States that his hemoglobin A1C had gone down to 7.4%.  States he is wearing his pump in Auto mode and his blood sugars have been much better.  States that he and his wife saw the RD about his diet and how to follow a renal diet and a diabetes diet.  States he has been trying very hard to eat better.  States he no longer drinks diet sodas and has switched to almond milk.  States he is not going to the gym but he does a lot of walking at work.    Objective:   ROS  Physical Exam Today's Vitals   08/03/17 1501 08/03/17 1509  BP: 140/80   Pulse: 89   Resp: 16   SpO2: 97%   Weight: 254 lb 14.4 oz (115.6 kg)   Height: 1.803 m (5' 11" )   PainSc: 0-No pain 0-No pain   Encounter Medications:   Outpatient Encounter Prescriptions as of 08/03/2017  Medication Sig Note  . amLODipine (NORVASC) 5 MG tablet Take 10 mg by mouth daily. 2 tablets once a day   . atorvastatin (LIPITOR) 40 MG tablet Take 40 mg by mouth daily.   Marland Kitchen BAYER CONTOUR NEXT TEST test strip Inject 1 strip into the skin 4 (four) times daily as needed.   Marland Kitchen HUMALOG 100 UNIT/ML injection Inject 100 Units into the skin daily. 01/14/2017: Insulin pump continuous infusion  . hydrochlorothiazide (HYDRODIURIL) 12.5 MG tablet Take 12.5 mg by mouth daily.   . sildenafil (VIAGRA) 100 MG tablet Take 100 mg by mouth daily as needed for erectile dysfunction.   . VELTASSA 8.4 g packet MIX AND DIS 1 PACKET IN WATER AND DRINK PO ONCE D.   . albuterol (PROVENTIL HFA;VENTOLIN HFA) 108 (90 Base) MCG/ACT inhaler Inhale 1-2 puffs into the lungs  every 4 (four) hours as needed for wheezing or shortness of breath. (Patient not taking: Reported on 07/27/2017)   . aspirin EC 81 MG tablet Take 81 mg by mouth daily. 11/03/2015: Taking daily  . calcium carbonate (TUMS - DOSED IN MG ELEMENTAL CALCIUM) 500 MG chewable tablet Chew 1 tablet by mouth 3 (three) times daily as needed for indigestion or heartburn. Reported on 11/03/2015   . ibuprofen (ADVIL,MOTRIN) 200 MG tablet Take 600 mg by mouth every 6 (six) hours as needed for headache, mild pain or moderate pain. Reported on 09/25/2015   . Insulin Human (INSULIN PUMP) SOLN by Continuous infusion (non-IV) route. Humalog 09/07/2015: Insulin pump dosed by sliding scale Humalog 100units/ml  . sodium chloride (OCEAN) 0.65 % SOLN nasal spray Place 2 sprays into both nostrils every 2 (two) hours while awake.   . [DISCONTINUED] lisinopril (PRINIVIL,ZESTRIL) 20 MG tablet Take 20 mg by mouth 2 (two) times daily.     No facility-administered encounter medications on file as of 08/03/2017.     Functional Status:   In your present state of health, do you have any difficulty performing the following activities: 08/03/2017  Hearing? N  Vision? N  Difficulty concentrating or making decisions? N  Walking or climbing  stairs? N  Dressing or bathing? N  Doing errands, shopping? N  Some recent data might be hidden    Fall/Depression Screening:    Fall Risk  08/03/2017 07/27/2017 04/27/2017  Falls in the past year? No No No   PHQ 2/9 Scores 08/03/2017 07/27/2017 04/27/2017 07/27/2016 11/03/2015 09/25/2015 06/05/2015  PHQ - 2 Score 0 0 0 0 0 0 1    Assessment:  Member seen for follow up office visit for Link to Wellness program for self management of Type 1 diabetes. Member not meeting diabetes self management goal of 7% or below with last result 7.4% which much improved from 11.5%.Member now has new endocrinologist and primary care provider.  Member  on 670G Medtronic insulin pump with Guardian sensor and is  using in Auto Mode.  Blood sugars have improved and ranging 110-130 in the AM.  Member reports improved adherence with diet.  Member and wife had had education with RD on renal diet and diabetes.    Member due for annual eye exam and up to date with dental check ups.  Member to be transitioned to [facility] for diabetes management in 2019.  Plan:  Plan to eat 3-4(45-60gm) servings of carbohydrates a meal 15 gm for snacks.  Plan to eat snack of protein and carbohydrate in the afternoon Plan to bolus insulin 30 minutes before eating and make sure to eat. Plan to go to gym 2 times a week 30-45 minutes Plan to schedule eye exam Plan to see Dr. Gabriel [name] on 10/11/17 Plan to be followed by [facility] in 2019  [facility] CM Care Plan Problem One     Most Recent Value  Care Plan Problem One  Elevated blood sugars as evidenced by hemoglobin A1C of 12.5% related to dx of Type 1 DM  Role Documenting the Problem One  Care Management Coordinator  Care Plan for Problem One  Not Active  [facility] Long Term Goal   Member will decrease hemoglobin A1C to 7% or below in the next 90 days  [facility] Long Term Goal Start Date  04/27/17 [Last hemoglobin A1C 11.5%]  [facility] Long Term Goal Met Date  08/03/17 [to be enrolled in an external program]  Interventions for Problem One Long Term Goal  Praised for lowering hemoglobin A1C to 7.4%, Instructed that he will be enrolled in [place] [facility] for diabetes management and that his pharmacy and supply benefits will remain unchanged,  Reviewed CHO counting and portion control, Reviewed to follow renal diet, Reinforced importance of keeping his blood sugars  under control to help him with  long term complications, Encouraged to go to gym at [facility] and reinforced importance of regular exercise,  Encouraged to try to lose weight,     Member will be contacted by [facility] in January to continue diabetes disease management. Case closed for Link to  Wellness as member will be enrolled in an external program.   [name] Garter RN, [facility] Care Management Coordinator-Link to [place] Management [phone]

## 2017-08-04 NOTE — Patient Instructions (Signed)
1. Plan to eat 3-4(45-60gm) servings of carbohydrates a meal 15 gm for snacks.  Plan to eat snack of protein and carbohydrate in the afternoon 2. Plan to bolus insulin 30 minutes before eating and make sure to eat. 3. Plan to go to gym 2 times a week 30-45 minutes 4. Plan to schedule eye exam 5. Plan to see Dr. Gabriel Carina on 10/11/17 6. Plan to be followed by [facility] in 2019

## 2017-08-12 DIAGNOSIS — E875 Hyperkalemia: Secondary | ICD-10-CM | POA: Diagnosis not present

## 2017-08-12 DIAGNOSIS — I10 Essential (primary) hypertension: Secondary | ICD-10-CM | POA: Diagnosis not present

## 2017-08-12 DIAGNOSIS — E1022 Type 1 diabetes mellitus with diabetic chronic kidney disease: Secondary | ICD-10-CM | POA: Diagnosis not present

## 2017-08-12 DIAGNOSIS — N183 Chronic kidney disease, stage 3 (moderate): Secondary | ICD-10-CM | POA: Diagnosis not present

## 2017-08-19 ENCOUNTER — Ambulatory Visit: Payer: 59 | Attending: Internal Medicine

## 2017-08-19 DIAGNOSIS — G4733 Obstructive sleep apnea (adult) (pediatric): Secondary | ICD-10-CM | POA: Insufficient documentation

## 2017-08-19 DIAGNOSIS — R5383 Other fatigue: Secondary | ICD-10-CM | POA: Diagnosis not present

## 2017-08-19 DIAGNOSIS — G4730 Sleep apnea, unspecified: Secondary | ICD-10-CM | POA: Diagnosis present

## 2017-08-22 DIAGNOSIS — E1065 Type 1 diabetes mellitus with hyperglycemia: Secondary | ICD-10-CM | POA: Diagnosis not present

## 2017-08-24 DIAGNOSIS — E875 Hyperkalemia: Secondary | ICD-10-CM | POA: Diagnosis not present

## 2017-08-24 DIAGNOSIS — E1022 Type 1 diabetes mellitus with diabetic chronic kidney disease: Secondary | ICD-10-CM | POA: Diagnosis not present

## 2017-08-24 DIAGNOSIS — N183 Chronic kidney disease, stage 3 (moderate): Secondary | ICD-10-CM | POA: Diagnosis not present

## 2017-08-24 DIAGNOSIS — I10 Essential (primary) hypertension: Secondary | ICD-10-CM | POA: Diagnosis not present

## 2017-08-26 DIAGNOSIS — E1065 Type 1 diabetes mellitus with hyperglycemia: Secondary | ICD-10-CM | POA: Diagnosis not present

## 2017-09-07 ENCOUNTER — Ambulatory Visit: Payer: 59 | Admitting: Urology

## 2017-09-07 ENCOUNTER — Encounter: Payer: Self-pay | Admitting: Urology

## 2017-09-07 ENCOUNTER — Telehealth: Payer: Self-pay | Admitting: Urology

## 2017-09-07 DIAGNOSIS — N529 Male erectile dysfunction, unspecified: Secondary | ICD-10-CM

## 2017-09-07 NOTE — Telephone Encounter (Signed)
Patient is supposed to pick up a test dose of trimix and call back to make an app to do his training. Please make sure this gets called in please and thank you. Can you please call the patient when this has been done so he will know when to go pick it up @ [place]

## 2017-09-08 DIAGNOSIS — N529 Male erectile dysfunction, unspecified: Secondary | ICD-10-CM | POA: Insufficient documentation

## 2017-09-08 NOTE — Progress Notes (Signed)
09/07/2017 8:42 AM   [name] [birth year] [id number]  Referring provider: Dion Body, MD [facility] [place], [place] [address]  Chief Complaint  Patient presents with  . Erectile Dysfunction    HPI: [name] is a 46 y.o. male who presents for evaluation and treatment of erectile dysfunction.  He has insulin-dependent diabetes and a 8-10-year history of ED.  He has been using PDE 5 inhibitors and is currently using Viagra 100 mg.  He typically has an erection firm enough for penetration however he states he will lose the erection prior to ejaculation.  He has good libido and states his testosterone level has been checked and is normal.  He also complains of loss of seminal emission.  Organic risk factors include insulin-dependent diabetes, hypertension, chronic kidney disease, hypercholesterolemia and antihypertensive medications.  Denies previous tobacco history.  He denies pain or curvature with erections.  He wants to look at other options for his ED treatment.   PMH: Past Medical History:  Diagnosis Date  . CKD (chronic kidney disease), stage II   . Gastroparesis   . Herniated cervical disc   . HTN (hypertension)   . Hypercholesteremia   . S/P cardiac cath    a. 10-15 yrs ago at [facility] due to tachycardia, reportedly normal. b. Normal ETT 03/2012.  Marland Kitchen Sinus tachycardia    a. 24-hr Holter 03/2012 - SR, occ PVCs, no VT, avg HR 96bpm.  . Type 1 diabetes mellitus ([place])    a. With insulin pump.    Surgical History: Past Surgical History:  Procedure Laterality Date  . cervical neck fusion    . HERNIA REPAIR     bilateral  . LEFT HEART CATHETERIZATION WITH CORONARY ANGIOGRAM N/A 12/18/2013   Procedure: LEFT HEART CATHETERIZATION WITH CORONARY ANGIOGRAM;  Surgeon: [name] M Martinique, MD;  Location: [facility] CATH LAB;  Service: Cardiovascular;  Laterality: N/A;    Home Medications:  Allergies as of 09/07/2017      Reactions   Oxycontin  [oxycodone Hcl] Nausea And Vomiting   Penicillins Other (See Comments)   Possible reaction many years ago per patient   Prednisone Other (See Comments)   Patient is diabetic, runs sugar up   Shellfish-derived Products    Pt is not allergic to iodine, has had iodine in the past w/o premeds and w/ no problems      Medication List        Accurate as of 09/07/17 11:59 PM. Always use your most recent med list.          amLODipine 5 MG tablet Commonly known as:  NORVASC Take 10 mg by mouth daily. 2 tablets once a day   aspirin EC 81 MG tablet Take 81 mg by mouth daily.   atorvastatin 40 MG tablet Commonly known as:  LIPITOR Take 40 mg by mouth daily.   BAYER CONTOUR NEXT TEST test strip Generic drug:  glucose blood Inject 1 strip into the skin 4 (four) times daily as needed.   calcium carbonate 500 MG chewable tablet Commonly known as:  TUMS - dosed in mg elemental calcium Chew 1 tablet by mouth 3 (three) times daily as needed for indigestion or heartburn. Reported on 11/03/2015   HUMALOG 100 UNIT/ML injection Generic drug:  insulin lispro Inject 100 Units into the skin daily.   hydrochlorothiazide 12.5 MG tablet Commonly known as:  HYDRODIURIL Take 12.5 mg by mouth daily.   ibuprofen 200 MG tablet Commonly known as:  ADVIL,MOTRIN Take 600 mg by mouth every 6 (six) hours as needed for headache, mild pain or moderate pain. Reported on 09/25/2015   sildenafil 100 MG tablet Commonly known as:  VIAGRA Take 100 mg by mouth daily as needed for erectile dysfunction.   sodium chloride 0.65 % Soln nasal spray Commonly known as:  OCEAN Place 2 sprays into both nostrils every 2 (two) hours while awake.   VELTASSA 8.4 g packet Generic drug:  patiromer MIX AND DIS 1 PACKET IN WATER AND DRINK PO ONCE D.       Allergies:  Allergies  Allergen Reactions  . Oxycontin [Oxycodone Hcl] Nausea And Vomiting  . Penicillins Other (See Comments)    Possible reaction many years ago  per patient  . Prednisone Other (See Comments)    Patient is diabetic, runs sugar up  . Shellfish-Derived Products     Pt is not allergic to iodine, has had iodine in the past w/o premeds and w/ no problems    Family History: Family History  Problem Relation Age of Onset  . Hypertension Unknown   . Coronary artery disease Unknown        Mother's side - both her side's grandparents died of heart disease (MIs)  . Diabetes Unknown   . Stroke Unknown        Paternal grandfather (62)  . Prostate cancer Neg Hx   . Bladder Cancer Neg Hx   . Kidney cancer Neg Hx     Social History:  reports that  has never smoked. he has never used smokeless tobacco. He reports that he does not drink alcohol or use drugs.  ROS: UROLOGY Frequent Urination?: No Hard to postpone urination?: No Burning/pain with urination?: No Get up at night to urinate?: No Leakage of urine?: No Urine stream starts and stops?: No Trouble starting stream?: No Do you have to strain to urinate?: No Blood in urine?: No Urinary tract infection?: No Sexually transmitted disease?: No Injury to kidneys or bladder?: No Painful intercourse?: No Weak stream?: No Erection problems?: Yes Penile pain?: No  Gastrointestinal Nausea?: No Vomiting?: No Indigestion/heartburn?: No Diarrhea?: No Constipation?: No  Constitutional Fever: No Night sweats?: No Weight loss?: No Fatigue?: No  Skin Skin rash/lesions?: No Itching?: No  Eyes Blurred vision?: No Double vision?: No  Ears/Nose/Throat Sore throat?: No Sinus problems?: No  Hematologic/Lymphatic Swollen glands?: No Easy bruising?: No  Cardiovascular Leg swelling?: No Chest pain?: No  Respiratory Cough?: No Shortness of breath?: No  Endocrine Excessive thirst?: No  Musculoskeletal Back pain?: Yes Joint pain?: No  Neurological Headaches?: No Dizziness?: No  Psychologic Depression?: No Anxiety?: No  Physical Exam: BP (!) 160/93 (BP  Location: Right Arm, Patient Position: Sitting, Cuff Size: Large)   Pulse 88   Ht 5\' 11"  (1.803 m)   Wt 252 lb (114.3 kg)   BMI 35.15 kg/m   Constitutional:  Alert and oriented, No acute distress. HEENT: [place] AT, moist mucus membranes.  Trachea midline, no masses. Cardiovascular: No clubbing, cyanosis, or edema. Respiratory: Normal respiratory effort, no increased work of breathing. GI: Abdomen is soft, nontender, nondistended, no abdominal masses GU: No CVA tenderness.  Penis circumcised without lesions.  No palpable plaques.  Testes descended bilaterally without masses or tenderness cord/epididymes palpably normal. Skin: No rashes, bruises or suspicious lesions. Lymph: No cervical or inguinal adenopathy. Neurologic: Grossly intact, no focal deficits, moving all 4 extremities. Psychiatric: Normal mood and affect.  Laboratory Data: Lab Results  Component Value Date  WBC 6.9 09/11/2015   HGB 12.8 (L) 09/11/2015   HCT 38.8 (L) 09/11/2015   MCV 86.8 09/11/2015   PLT 284 09/11/2015    Lab Results  Component Value Date   CREATININE 1.19 09/08/2015    Lab Results  Component Value Date   HGBA1C 12.5 (H) 09/08/2015     Results for orders placed during the hospital encounter of 07/13/17  US RENAL   Narrative CLINICAL DATA:  46 year old male with stage 3 chronic kidney disease.  EXAM: RENAL / URINARY TRACT ULTRASOUND COMPLETE  COMPARISON:  CTA Abdomen and Pelvis 12/24/2010  FINDINGS: Right Kidney:  Length: 11.2 cm. Increased renal cortical echogenicity (image 5). No mass or hydronephrosis.  Left Kidney:  Length: 11.6 cm. Increased left renal cortical echogenicity suspected similar to the right side. No mass or hydronephrosis.  Bladder:  Appears normal for degree of bladder distention. Both ureteral jets detected with Doppler.  Other findings: Small right hepatic lobe 1.2 cm echogenic focus is probably a benign liver hemangioma (image 3).  IMPRESSION: 1. No  acute renal findings. Mildly increased cortical echogenicity compatible with chronic medical renal disease. 2. Incidental small benign liver hemangioma.   Electronically Signed   By: Genevie Ann M.D.   On: 07/13/2017 17:12     Assessment & Plan:   1. Erectile dysfunction, unspecified erectile dysfunction type We discussed other treatment options including intracavernosal injections, vacuum erection devices and penile implant surgery.  He was interested in intracavernosal injections.  He will follow-up for injection training and test dose tri-mix  2.  Loss of seminal emission He is on no medications which should cause this side effect.  We discussed possibilities of retrograde ejaculation and neurogenic etiology secondary to his diabetes.  He states he is not really bothered by this problem and was primarily here for ED.   Abbie Sons, [place] [address], [place], [place] [address] [phone]

## 2017-09-09 DIAGNOSIS — G4733 Obstructive sleep apnea (adult) (pediatric): Secondary | ICD-10-CM | POA: Diagnosis not present

## 2017-09-13 ENCOUNTER — Encounter: Payer: Self-pay | Admitting: Urology

## 2017-09-13 ENCOUNTER — Ambulatory Visit: Payer: 59 | Admitting: Urology

## 2017-09-13 VITALS — BP 174/100 | HR 93 | Ht 71.0 in | Wt 253.6 lb

## 2017-09-13 DIAGNOSIS — N529 Male erectile dysfunction, unspecified: Secondary | ICD-10-CM

## 2017-09-13 NOTE — Progress Notes (Signed)
46 year old male with erectile dysfunction presents today for intracavernosal injection training.  Landmarks were reviewed and he was observed to successfully inject 0.1 cc trimix into the right corpus cavernosum.  He will call back with the rigidity and duration of the erection.  He was instructed to call for a rigid erection lasting longer than 3 hours.

## 2017-09-14 ENCOUNTER — Telehealth: Payer: Self-pay

## 2017-09-14 NOTE — Telephone Encounter (Signed)
Pt called stating he injected 0.1 of trimix yesterday without success. Pt stated absolutely nothing. Pt then stated this morning he injected 0.2 and got a semifirm erection that was not firm enough for penetration. Pt inquired injecting 0.3. Please advise.

## 2017-09-14 NOTE — Telephone Encounter (Signed)
I saw the patient while I was in the hospital and he asked about dosing.  He was informed he could increase to 0.3 cc

## 2017-10-10 DIAGNOSIS — G4733 Obstructive sleep apnea (adult) (pediatric): Secondary | ICD-10-CM | POA: Diagnosis not present

## 2017-10-18 DIAGNOSIS — E875 Hyperkalemia: Secondary | ICD-10-CM | POA: Diagnosis not present

## 2017-10-18 DIAGNOSIS — E1022 Type 1 diabetes mellitus with diabetic chronic kidney disease: Secondary | ICD-10-CM | POA: Diagnosis not present

## 2017-10-18 DIAGNOSIS — I10 Essential (primary) hypertension: Secondary | ICD-10-CM | POA: Diagnosis not present

## 2017-10-18 DIAGNOSIS — N183 Chronic kidney disease, stage 3 (moderate): Secondary | ICD-10-CM | POA: Diagnosis not present

## 2017-10-25 ENCOUNTER — Ambulatory Visit: Payer: 59 | Admitting: Urology

## 2017-10-28 ENCOUNTER — Ambulatory Visit (INDEPENDENT_AMBULATORY_CARE_PROVIDER_SITE_OTHER): Payer: Self-pay | Admitting: Family

## 2017-10-28 ENCOUNTER — Encounter: Payer: Self-pay | Admitting: Urology

## 2017-10-28 ENCOUNTER — Ambulatory Visit: Payer: 59 | Admitting: Urology

## 2017-10-28 ENCOUNTER — Encounter: Payer: Self-pay | Admitting: Family

## 2017-10-28 VITALS — BP 130/71 | HR 112 | Temp 100.4°F | Wt 262.0 lb

## 2017-10-28 VITALS — BP 111/74 | HR 112 | Ht 71.0 in | Wt 262.0 lb

## 2017-10-28 DIAGNOSIS — N529 Male erectile dysfunction, unspecified: Secondary | ICD-10-CM | POA: Diagnosis not present

## 2017-10-28 DIAGNOSIS — J069 Acute upper respiratory infection, unspecified: Secondary | ICD-10-CM

## 2017-10-28 DIAGNOSIS — E109 Type 1 diabetes mellitus without complications: Secondary | ICD-10-CM

## 2017-10-28 DIAGNOSIS — R509 Fever, unspecified: Secondary | ICD-10-CM

## 2017-10-28 LAB — POCT INFLUENZA A/B
Influenza A, POC: NEGATIVE
Influenza B, POC: NEGATIVE

## 2017-10-28 MED ORDER — OSELTAMIVIR PHOSPHATE 75 MG PO CAPS: 75.0000 mg | ORAL_CAPSULE | Freq: Two times a day (BID) | ORAL | 0 refills | Status: DC

## 2017-10-28 MED ORDER — PROMETHAZINE-PHENYLEPHRINE 6.25-5 MG/5ML PO SYRP: 5.0000 mL | ORAL_SOLUTION | Freq: Four times a day (QID) | ORAL | 0 refills | Status: DC | PRN

## 2017-10-28 NOTE — Progress Notes (Signed)
10/28/2017 1:40 PM   [name] 08/05/1971 [id number]  Referring provider: Dion Body, MD [name], [name]  Chief Complaint  Patient presents with  . Erectile Dysfunction    HPI: 47 year old male presents for follow-up of erectile dysfunction.  He recently started tri-mix.  He was able to achieve a good erection with a dose of 0.3 cc however he complains of an intense burning penile sensation approximately 30-45 minutes after the injection.  He is satisfied with the erection quality at this dose.   PMH: Past Medical History:  Diagnosis Date  . CKD (chronic kidney disease), stage II   . Gastroparesis   . Herniated cervical disc   . HTN (hypertension)   . Hypercholesteremia   . S/P cardiac cath    a. 10-15 yrs ago at [facility] due to tachycardia, reportedly normal. b. Normal ETT 03/2012.  Marland Kitchen Sinus tachycardia    a. 24-hr Holter 03/2012 - SR, occ PVCs, no VT, avg HR 96bpm.  . Type 1 diabetes mellitus ([place])    a. With insulin pump.    Surgical History: Past Surgical History:  Procedure Laterality Date  . cervical neck fusion    . HERNIA REPAIR     bilateral  . LEFT HEART CATHETERIZATION WITH CORONARY ANGIOGRAM N/A 12/18/2013   Procedure: LEFT HEART CATHETERIZATION WITH CORONARY ANGIOGRAM;  Surgeon: [name] M Martinique, MD;  Location: [facility] CATH LAB;  Service: Cardiovascular;  Laterality: N/A;    Home Medications:  Allergies as of 10/28/2017      Reactions   Oxycontin [oxycodone Hcl] Nausea And Vomiting   Penicillins Other (See Comments)   Possible reaction many years ago per patient   Prednisone Other (See Comments)   Patient is diabetic, runs sugar up   Shellfish-derived Products    Pt is not allergic to iodine, has had iodine in the past w/o premeds and w/ no problems      Medication List        Accurate as of 10/28/17  1:40 PM. Always use your most recent med list.          amLODipine 5 MG tablet Commonly  known as:  NORVASC Take 10 mg by mouth daily. 2 tablets once a day   atorvastatin 40 MG tablet Commonly known as:  LIPITOR Take 40 mg by mouth daily.   BAYER CONTOUR NEXT TEST test strip Generic drug:  glucose blood Inject 1 strip into the skin 4 (four) times daily as needed.   HUMALOG 100 UNIT/ML injection Generic drug:  insulin lispro Inject 100 Units into the skin daily.   hydrochlorothiazide 12.5 MG tablet Commonly known as:  HYDRODIURIL Take 12.5 mg by mouth daily.   losartan 50 MG tablet Commonly known as:  COZAAR Take 50 mg by mouth daily.   VELTASSA 8.4 g packet Generic drug:  patiromer MIX AND DIS 1 PACKET IN WATER AND DRINK PO ONCE D.       Allergies:  Allergies  Allergen Reactions  . Oxycontin [Oxycodone Hcl] Nausea And Vomiting  . Penicillins Other (See Comments)    Possible reaction many years ago per patient  . Prednisone Other (See Comments)    Patient is diabetic, runs sugar up  . Shellfish-Derived Products     Pt is not allergic to iodine, has had iodine in the past w/o premeds and w/ no problems    Family History: Family History  Problem Relation Age of Onset  . Hypertension Unknown   .  Coronary artery disease Unknown        Mother's side - both her side's grandparents died of heart disease (MIs)  . Diabetes Unknown   . Stroke Unknown        Paternal grandfather (61)  . Prostate cancer Neg Hx   . Bladder Cancer Neg Hx   . Kidney cancer Neg Hx     Social History:  reports that  has never smoked. he has never used smokeless tobacco. He reports that he does not drink alcohol or use drugs.  ROS: UROLOGY Frequent Urination?: No Hard to postpone urination?: No Burning/pain with urination?: No Get up at night to urinate?: No Leakage of urine?: No Urine stream starts and stops?: No Trouble starting stream?: No Do you have to strain to urinate?: No Blood in urine?: No Urinary tract infection?: No Sexually transmitted disease?: No Injury  to kidneys or bladder?: No Painful intercourse?: No Weak stream?: No Erection problems?: Yes Penile pain?: No  Gastrointestinal Nausea?: No Vomiting?: No Indigestion/heartburn?: No Diarrhea?: No Constipation?: No  Constitutional Fever: No Night sweats?: No Weight loss?: No Fatigue?: No  Skin Skin rash/lesions?: No Itching?: No  Eyes Blurred vision?: No Double vision?: No  Ears/Nose/Throat Sore throat?: No Sinus problems?: No  Hematologic/Lymphatic Swollen glands?: No Easy bruising?: No  Cardiovascular Leg swelling?: No Chest pain?: No  Respiratory Cough?: Yes Shortness of breath?: No  Endocrine Excessive thirst?: No  Musculoskeletal Back pain?: No Joint pain?: No  Neurological Headaches?: Yes Dizziness?: No  Psychologic Depression?: No Anxiety?: No  Physical Exam: BP 111/74 (BP Location: Left Arm, Patient Position: Sitting, Cuff Size: Large)   Pulse (!) 112   Ht 5\' 11"  (1.803 m)   Wt 262 lb (118.8 kg)   BMI 36.54 kg/m   Constitutional:  Alert and oriented, No acute distress. HEENT: [place] AT, moist mucus membranes.  Trachea midline, no masses.  Laboratory Data: Lab Results  Component Value Date   WBC 6.9 09/11/2015   HGB 12.8 (L) 09/11/2015   HCT 38.8 (L) 09/11/2015   MCV 86.8 09/11/2015   PLT 284 09/11/2015    Lab Results  Component Value Date   CREATININE 1.19 09/08/2015     Lab Results  Component Value Date   HGBA1C 12.5 (H) 09/08/2015    Urinalysis Lab Results  Component Value Date   APPEARANCEUR CLEAR 09/07/2015   LEUKOCYTESUR NEGATIVE 09/07/2015   PROTEINUR 100 (A) 09/07/2015   GLUCOSEU >1000 (A) 09/07/2015   RBCU 6-30 09/07/2015   BILIRUBINUR NEGATIVE 09/07/2015   NITRITE NEGATIVE 09/07/2015    Lab Results  Component Value Date   BACTERIA RARE (A) 09/07/2015      Assessment & Plan:   1.  Erectile dysfunction We will see if he has less penile pain with bi-mix 30/1.   Return in about 6 months (around  04/27/2018) for Recheck.     Abbie Sons, [place] [address] [place], [place] [address] [phone]

## 2017-10-28 NOTE — Patient Instructions (Signed)

## 2017-10-28 NOTE — Progress Notes (Signed)
Subjective:     Patient ID: [name], male   DOB: 04-27-1971, 47 y.o.   MRN: [id number]  HPI 47 year old white male, nonsmoker, Type 1 diabetic is in today with c/o cough, fatigue, headache, congestion, headache, x 2 days. Has been taking OTC allergy medication w/o relief. Wears an insulin pump. Glucose 112  Review of Systems  Constitutional: Positive for chills, fatigue and fever.  HENT: Positive for congestion.   Respiratory: Positive for cough and wheezing.   Cardiovascular: Negative.   Gastrointestinal: Negative.   Endocrine: Negative.   Allergic/Immunologic: Negative.   Neurological: Negative.   Psychiatric/Behavioral: Negative.    Past Medical History:  Diagnosis Date  . CKD (chronic kidney disease), stage II   . Gastroparesis   . Herniated cervical disc   . HTN (hypertension)   . Hypercholesteremia   . S/P cardiac cath    a. 10-15 yrs ago at [facility] due to tachycardia, reportedly normal. b. Normal ETT 03/2012.  Marland Kitchen Sinus tachycardia    a. 24-hr Holter 03/2012 - SR, occ PVCs, no VT, avg HR 96bpm.  . Type 1 diabetes mellitus ([place])    a. With insulin pump.    Social History   Socioeconomic History  . Marital status: Married    Spouse name: Not on file  . Number of children: Not on file  . Years of education: Not on file  . Highest education level: Not on file  Social Needs  . Financial resource strain: Not on file  . Food insecurity - worry: Not on file  . Food insecurity - inability: Not on file  . Transportation needs - medical: Not on file  . Transportation needs - non-medical: Not on file  Occupational History  . Not on file  Tobacco Use  . Smoking status: Never Smoker  . Smokeless tobacco: Never Used  Substance and Sexual Activity  . Alcohol use: No  . Drug use: No  . Sexual activity: Yes  Other Topics Concern  . Not on file  Social History Narrative  . Not on file    Past Surgical History:  Procedure Laterality Date  . cervical neck fusion     . HERNIA REPAIR     bilateral  . LEFT HEART CATHETERIZATION WITH CORONARY ANGIOGRAM N/A 12/18/2013   Procedure: LEFT HEART CATHETERIZATION WITH CORONARY ANGIOGRAM;  Surgeon: [name] M Martinique, MD;  Location: [facility] CATH LAB;  Service: Cardiovascular;  Laterality: N/A;    Family History  Problem Relation Age of Onset  . Hypertension Unknown   . Coronary artery disease Unknown        Mother's side - both her side's grandparents died of heart disease (MIs)  . Diabetes Unknown   . Stroke Unknown        Paternal grandfather (89)  . Prostate cancer Neg Hx   . Bladder Cancer Neg Hx   . Kidney cancer Neg Hx     Allergies  Allergen Reactions  . Oxycontin [Oxycodone Hcl] Nausea And Vomiting  . Penicillins Other (See Comments)    Possible reaction many years ago per patient  . Prednisone Other (See Comments)    Patient is diabetic, runs sugar up  . Shellfish-Derived Products     Pt is not allergic to iodine, has had iodine in the past w/o premeds and w/ no problems    Current Outpatient Medications on File Prior to Visit  Medication Sig Dispense Refill  . amLODipine (NORVASC) 5 MG tablet Take 10 mg by mouth  daily. 2 tablets once a day    . atorvastatin (LIPITOR) 40 MG tablet Take 40 mg by mouth daily.    Marland Kitchen BAYER CONTOUR NEXT TEST test strip Inject 1 strip into the skin 4 (four) times daily as needed.  11  . HUMALOG 100 UNIT/ML injection Inject 100 Units into the skin daily.  11  . hydrochlorothiazide (HYDRODIURIL) 12.5 MG tablet Take 12.5 mg by mouth daily.    Marland Kitchen losartan (COZAAR) 50 MG tablet Take 50 mg by mouth daily.    . VELTASSA 8.4 g packet MIX AND DIS 1 PACKET IN WATER AND DRINK PO ONCE D.  4   No current facility-administered medications on file prior to visit.     BP 130/71   Pulse (!) 112   Temp (!) 100.4 F (38 C)   Wt 262 lb (118.8 kg)   SpO2 97%   BMI 36.54 kg/m chart    Objective:   Physical Exam  Constitutional: He is oriented to person, place, and time. He appears  well-developed and well-nourished.  HENT:  Right Ear: External ear normal.  Left Ear: External ear normal.  Mouth/Throat: Oropharynx is clear and moist.  Neck: Normal range of motion.  Cardiovascular: Normal rate, regular rhythm and normal heart sounds.  Pulmonary/Chest: Effort normal and breath sounds normal.  Course breath sounds noted   Musculoskeletal: Normal range of motion.  Neurological: He is alert and oriented to person, place, and time.  Skin: Skin is warm and dry.  Psychiatric: He has a normal mood and affect.       Assessment:     [name] was seen today for choice-[place]-bodyache/chills/ha/cough/ x2d.  Diagnoses and all orders for this visit:  Chills with fever -     POCT Influenza A/B  Type 1 diabetes mellitus without complications (HCC)  Viral upper respiratory illness  Other orders -     oseltamivir (TAMIFLU) 75 MG capsule; Take 1 capsule (75 mg total) by mouth 2 (two) times daily. -     promethazine-phenylephrine (PROMETHAZINE-PHENYLEPHRINE) 6.25-5 MG/5ML SYRP; Take 5 mLs by mouth every 6 (six) hours as needed for congestion.      Plan:     Call the office if symptoms worsen or persist. See PCP with any additional concerns.

## 2017-11-10 DIAGNOSIS — G4733 Obstructive sleep apnea (adult) (pediatric): Secondary | ICD-10-CM | POA: Diagnosis not present

## 2017-11-28 DIAGNOSIS — I10 Essential (primary) hypertension: Secondary | ICD-10-CM | POA: Diagnosis not present

## 2017-11-28 DIAGNOSIS — E7800 Pure hypercholesterolemia, unspecified: Secondary | ICD-10-CM | POA: Diagnosis not present

## 2017-11-28 DIAGNOSIS — N183 Chronic kidney disease, stage 3 (moderate): Secondary | ICD-10-CM | POA: Diagnosis not present

## 2017-12-05 DIAGNOSIS — E7800 Pure hypercholesterolemia, unspecified: Secondary | ICD-10-CM | POA: Diagnosis not present

## 2017-12-05 DIAGNOSIS — N183 Chronic kidney disease, stage 3 (moderate): Secondary | ICD-10-CM | POA: Diagnosis not present

## 2017-12-05 DIAGNOSIS — Z0000 Encounter for general adult medical examination without abnormal findings: Secondary | ICD-10-CM | POA: Diagnosis not present

## 2017-12-05 DIAGNOSIS — I10 Essential (primary) hypertension: Secondary | ICD-10-CM | POA: Diagnosis not present

## 2017-12-08 DIAGNOSIS — G4733 Obstructive sleep apnea (adult) (pediatric): Secondary | ICD-10-CM | POA: Diagnosis not present

## 2017-12-15 DIAGNOSIS — E1065 Type 1 diabetes mellitus with hyperglycemia: Secondary | ICD-10-CM | POA: Diagnosis not present

## 2018-02-02 ENCOUNTER — Encounter: Payer: Self-pay | Admitting: Family Medicine

## 2018-02-02 ENCOUNTER — Ambulatory Visit (INDEPENDENT_AMBULATORY_CARE_PROVIDER_SITE_OTHER): Payer: Self-pay | Admitting: Family Medicine

## 2018-02-02 VITALS — BP 140/80 | HR 93 | Temp 98.8°F | Wt 250.0 lb

## 2018-02-02 DIAGNOSIS — R05 Cough: Secondary | ICD-10-CM

## 2018-02-02 DIAGNOSIS — R0602 Shortness of breath: Secondary | ICD-10-CM

## 2018-02-02 DIAGNOSIS — J069 Acute upper respiratory infection, unspecified: Secondary | ICD-10-CM

## 2018-02-02 DIAGNOSIS — R059 Cough, unspecified: Secondary | ICD-10-CM

## 2018-02-02 MED ORDER — IPRATROPIUM BROMIDE 0.03 % NA SOLN: 2.0000 | Freq: Two times a day (BID) | NASAL | 0 refills | Status: DC

## 2018-02-02 MED ORDER — BENZONATATE 100 MG PO CAPS
100.0000 mg | ORAL_CAPSULE | Freq: Three times a day (TID) | ORAL | 0 refills | Status: DC | PRN
Start: 2018-02-02 — End: 2018-03-21

## 2018-02-02 MED ORDER — ALBUTEROL SULFATE 108 (90 BASE) MCG/ACT IN AEPB: 2.0000 | INHALATION_SPRAY | RESPIRATORY_TRACT | 2 refills | Status: DC | PRN

## 2018-02-02 MED ORDER — LEVOCETIRIZINE DIHYDROCHLORIDE 5 MG PO TABS: 5.0000 mg | ORAL_TABLET | Freq: Every evening | ORAL | 1 refills | Status: DC

## 2018-02-02 NOTE — Progress Notes (Signed)
Patient ID: MAJD TISSUE, male    DOB: 11/10/1970, 47 y.o.   MRN: [id number]  PCP: Dion Body, MD  Chief Complaint  Patient presents with  . choice-chest/nasal congestion    shortness of breath     Subjective:  HPI [name] is a 47 y.o. male with a history of insulin-dependent diabetes, hypertension, obesity, CKD 3 presents for evaluation of 2 days of chest and nasal congestion, cough and shortness of breath.  Patient reports in the past developing shortness of breath with accompanying nasal congestion and cough.  At present the symptoms have been unresponsive to Zyrtec.  Due to chronic illnesses he has not tried over-the-counter medications. He reports in the past he was prescribed an albuterol inhaler for shortness of breath and persistent cough which resolved symptoms.  He denies fever, body aches, nausea or vomiting, or headache.  He endorses experiencing some dizziness earlier and profound fatigue currently. Of note he did check his blood sugar prior to arrival today and it was 200.  He reports that his blood sugars are always high when he eats ill. He has an upcoming appointment with his endocrinologist on 02/05/2018. Social History   Socioeconomic History  . Marital status: Married    Spouse name: Not on file  . Number of children: Not on file  . Years of education: Not on file  . Highest education level: Not on file  Occupational History  . Not on file  Social Needs  . Financial resource strain: Not on file  . Food insecurity:    Worry: Not on file    Inability: Not on file  . Transportation needs:    Medical: Not on file    Non-medical: Not on file  Tobacco Use  . Smoking status: Never Smoker  . Smokeless tobacco: Never Used  Substance and Sexual Activity  . Alcohol use: No  . Drug use: No  . Sexual activity: Yes  Lifestyle  . Physical activity:    Days per week: Not on file    Minutes per session: Not on file  . Stress: Not on file   Relationships  . Social connections:    Talks on phone: Not on file    Gets together: Not on file    Attends religious service: Not on file    Active member of club or organization: Not on file    Attends meetings of clubs or organizations: Not on file    Relationship status: Not on file  . Intimate partner violence:    Fear of current or ex partner: Not on file    Emotionally abused: Not on file    Physically abused: Not on file    Forced sexual activity: Not on file  Other Topics Concern  . Not on file  Social History Narrative  . Not on file    Family History  Problem Relation Age of Onset  . Hypertension Unknown   . Coronary artery disease Unknown        Mother's side - both her side's grandparents died of heart disease (MIs)  . Diabetes Unknown   . Stroke Unknown        Paternal grandfather (6)  . Prostate cancer Neg Hx   . Bladder Cancer Neg Hx   . Kidney cancer Neg Hx    Review of Systems Pertinent negatives listed in HPI  Patient Active Problem List   Diagnosis Date Noted  . Erectile dysfunction 09/08/2017  . CKD (chronic kidney disease)  stage 3, GFR 30-59 ml/min (HCC) 04/22/2017  . Type 1 diabetes mellitus with diabetic neuropathy ([place]) 02/25/2017  . Leg pain   . Acute kidney injury ([place])   . Sepsis ([place]) 09/07/2015  . Acute on chronic kidney failure ([place]) 09/07/2015  . Cellulitis of leg, right 09/07/2015  . Herniated cervical disc   . Hypercholesteremia   . HTN (hypertension)   . Nausea & vomiting 10/19/2012  . Diarrhea 10/19/2012  . DKA (diabetic ketoacidoses) ([place]) 10/19/2012  . Chest pain 03/13/2012  . Dyspnea 03/13/2012  . Tachycardia 03/13/2012  . IDDM (insulin dependent diabetes mellitus) ([place]) 03/13/2012    Allergies  Allergen Reactions  . Oxycontin [Oxycodone Hcl] Nausea And Vomiting  . Penicillins Other (See Comments)    Possible reaction many years ago per patient  . Prednisone Other (See Comments)    Patient is diabetic, runs sugar  up  . Shellfish-Derived Products     Pt is not allergic to iodine, has had iodine in the past w/o premeds and w/ no problems    Prior to Admission medications   Medication Sig Start Date End Date Taking? Authorizing Provider  atorvastatin (LIPITOR) 40 MG tablet Take 40 mg by mouth daily.   Yes [provider]  BAYER CONTOUR NEXT TEST test strip Inject 1 strip into the skin 4 (four) times daily as needed. 12/23/16  Yes [provider]  HUMALOG 100 UNIT/ML injection Inject 100 Units into the skin daily. 11/11/16  Yes [provider]  losartan (COZAAR) 50 MG tablet Take 50 mg by mouth daily.   Yes [provider]  VELTASSA 8.4 g packet MIX AND DIS 1 PACKET IN WATER AND DRINK PO ONCE D. 07/21/17  Yes [provider]  amLODipine (NORVASC) 5 MG tablet Take 10 mg by mouth daily. 2 tablets once a day 01/25/17   [provider]  hydrochlorothiazide (HYDRODIURIL) 12.5 MG tablet Take 12.5 mg by mouth daily. 04/22/17 04/22/18  [provider]  oseltamivir (TAMIFLU) 75 MG capsule Take 1 capsule (75 mg total) by mouth 2 (two) times daily. Patient not taking: Reported on 02/02/2018 10/28/17   Kennyth Arnold, FNP  promethazine-phenylephrine (PROMETHAZINE-PHENYLEPHRINE) 6.25-5 MG/5ML SYRP Take 5 mLs by mouth every 6 (six) hours as needed for congestion. Patient not taking: Reported on 02/02/2018 10/28/17   Kennyth Arnold, FNP   Past Medical, Surgical Family and Social History reviewed and updated.   Objective:   Today's Vitals   02/02/18 1419  BP: 140/80  Pulse: 93  Temp: 98.8 F (37.1 C)  SpO2: 96%  Weight: 250 lb (113.4 kg)    Wt Readings from Last 3 Encounters:  02/02/18 250 lb (113.4 kg)  10/28/17 262 lb (118.8 kg)  10/28/17 262 lb (118.8 kg)   Physical Exam         Assessment & Plan:  1. Viral upper respiratory tract infection 2. Cough 3. SOB (shortness of breath)  I will treat patient symptomatically today for current  symptoms of cough facial and chest congestion and shortness of breath.  I do not feel that antibiotic therapy is warranted as the symptoms have only been present for 48 hours and are likely related to environmental allergies such as pollen. Patient has been given strict follow-up instructions if symptoms worsen.  here is no imaging available here at this site and I advised him that a chest x-ray may be warranted if his cough and fatigue symptoms do not resolve within the next 24 to 48 hours  given his chronic illnesses he is at high risk for pneumonia.  Advised him to continue to monitor his blood sugar and if blood sugar remains elevated to follow-up immediately with primary care provider.  See medication orders.  Meds ordered this encounter  Medications  . Albuterol Sulfate 108 (90 Base) MCG/ACT AEPB    Sig: Inhale 2 puffs into the lungs every 4 (four) hours as needed.    Dispense:  1 each    Refill:  2  . benzonatate (TESSALON) 100 MG capsule    Sig: Take 1-2 capsules (100-200 mg total) by mouth 3 (three) times daily as needed for cough.    Dispense:  60 capsule    Refill:  0  . levocetirizine (XYZAL) 5 MG tablet    Sig: Take 1 tablet (5 mg total) by mouth every evening.    Dispense:  90 tablet    Refill:  1  . ipratropium (ATROVENT) 0.03 % nasal spray    Sig: Place 2 sprays into both nostrils 2 (two) times daily.    Dispense:  30 mL    Refill:  0    If symptoms worsen or do not improve, return for follow-up, follow-up with PCP, or at the emergency department if severity of symptoms warrant a higher level of care.   Carroll Sage. Kenton Kingfisher, MSN, FNP-C [facility]  [place], [place] [id number]

## 2018-02-02 NOTE — Patient Instructions (Addendum)
If fatigue or shortness of breath worsens, follow-up immediately at the closest emergency department and/or urgent care which is equipped with chest imaging.  I am treating your symptoms today as they have only been present over the course of the last 2 days therefore antibiotic therapy is not warranted.    Take all medications as prescribed.   Shortness of Breath, Adult Shortness of breath means you have trouble breathing. Your lungs are organs for breathing. Follow these instructions at home: Pay attention to any changes in your symptoms. Take these actions to help with your condition:  Do not smoke. Smoking can cause shortness of breath. If you need help to quit smoking, ask your doctor.  Avoid things that can make it harder to breathe, such as: ? Mold. ? Dust. ? Air pollution. ? Chemical smells. ? Things that can cause allergy symptoms (allergens), if you have allergies.  Keep your living space clean and free of mold and dust.  Rest as needed. Slowly return to your usual activities.  Take over-the-counter and prescription medicines, including oxygen and inhaled medicines, only as told by your doctor.  Keep all follow-up visits as told by your doctor. This is important.  Contact a doctor if:  Your condition does not get better as soon as expected.  You have a hard time doing your normal activities, even after you rest.  You have new symptoms. Get help right away if:  You have trouble breathing when you are resting.  You feel light-headed or you faint.  You have a cough that is not helped by medicines.  You cough up blood.  You have pain with breathing.  You have pain in your chest, arms, shoulders, or belly (abdomen).  You have a fever.  You cannot walk up stairs.  You cannot exercise the way you normally do. This information is not intended to replace advice given to you by your health care provider. Make sure you discuss any questions you have with your  health care provider. Document Released: 03/08/2008 Document Revised: 10/07/2016 Document Reviewed: 10/07/2016 Elsevier Interactive Patient Education  2017 [employer].  Upper Respiratory Infection, Adult Most upper respiratory infections (URIs) are caused by a virus. A URI affects the nose, throat, and upper air passages. The most common type of URI is often called "the common cold." Follow these instructions at home:  Take medicines only as told by your doctor.  Gargle warm saltwater or take cough drops to comfort your throat as told by your doctor.  Use a warm mist humidifier or inhale steam from a shower to increase air moisture. This may make it easier to breathe.  Drink enough fluid to keep your pee (urine) clear or pale yellow.  Eat soups and other clear broths.  Have a healthy diet.  Rest as needed.  Go back to work when your fever is gone or your doctor says it is okay. ? You may need to stay home longer to avoid giving your URI to others. ? You can also wear a face mask and wash your hands often to prevent spread of the virus.  Use your inhaler more if you have asthma.  Do not use any tobacco products, including cigarettes, chewing tobacco, or electronic cigarettes. If you need help quitting, ask your doctor. Contact a doctor if:  You are getting worse, not better.  Your symptoms are not helped by medicine.  You have chills.  You are getting more short of breath.  You have brown or  red mucus.  You have yellow or brown discharge from your nose.  You have pain in your face, especially when you bend forward.  You have a fever.  You have puffy (swollen) neck glands.  You have pain while swallowing.  You have white areas in the back of your throat. Get help right away if:  You have very bad or constant: ? Headache. ? Ear pain. ? Pain in your forehead, behind your eyes, and over your cheekbones (sinus pain). ? Chest pain.  You have long-lasting  (chronic) lung disease and any of the following: ? Wheezing. ? Long-lasting cough. ? Coughing up blood. ? A change in your usual mucus.  You have a stiff neck.  You have changes in your: ? Vision. ? Hearing. ? Thinking. ? Mood. This information is not intended to replace advice given to you by your health care provider. Make sure you discuss any questions you have with your health care provider. Document Released: 03/08/2008 Document Revised: 05/23/2016 Document Reviewed: 12/26/2013 Elsevier Interactive Patient Education  2018 [employer].

## 2018-02-04 DIAGNOSIS — J209 Acute bronchitis, unspecified: Secondary | ICD-10-CM | POA: Diagnosis not present

## 2018-02-06 DIAGNOSIS — Z9641 Presence of insulin pump (external) (internal): Secondary | ICD-10-CM | POA: Diagnosis not present

## 2018-02-06 DIAGNOSIS — E1021 Type 1 diabetes mellitus with diabetic nephropathy: Secondary | ICD-10-CM | POA: Diagnosis not present

## 2018-02-06 DIAGNOSIS — E1022 Type 1 diabetes mellitus with diabetic chronic kidney disease: Secondary | ICD-10-CM | POA: Diagnosis not present

## 2018-02-06 DIAGNOSIS — E1040 Type 1 diabetes mellitus with diabetic neuropathy, unspecified: Secondary | ICD-10-CM | POA: Diagnosis not present

## 2018-02-09 ENCOUNTER — Telehealth: Payer: Self-pay | Admitting: Emergency Medicine

## 2018-02-09 NOTE — Telephone Encounter (Signed)
Left message follow up call  For visit with [facility]

## 2018-03-09 DIAGNOSIS — I10 Essential (primary) hypertension: Secondary | ICD-10-CM | POA: Diagnosis not present

## 2018-03-09 DIAGNOSIS — N183 Chronic kidney disease, stage 3 (moderate): Secondary | ICD-10-CM | POA: Diagnosis not present

## 2018-03-09 DIAGNOSIS — E1022 Type 1 diabetes mellitus with diabetic chronic kidney disease: Secondary | ICD-10-CM | POA: Diagnosis not present

## 2018-03-09 DIAGNOSIS — E875 Hyperkalemia: Secondary | ICD-10-CM | POA: Diagnosis not present

## 2018-03-21 ENCOUNTER — Encounter: Payer: Self-pay | Admitting: Internal Medicine

## 2018-03-21 ENCOUNTER — Ambulatory Visit: Payer: 59 | Admitting: Internal Medicine

## 2018-03-21 VITALS — BP 120/86 | HR 88 | Ht 71.0 in | Wt 261.0 lb

## 2018-03-21 DIAGNOSIS — K219 Gastro-esophageal reflux disease without esophagitis: Secondary | ICD-10-CM | POA: Diagnosis not present

## 2018-03-21 DIAGNOSIS — R079 Chest pain, unspecified: Secondary | ICD-10-CM | POA: Diagnosis not present

## 2018-03-21 DIAGNOSIS — R0602 Shortness of breath: Secondary | ICD-10-CM

## 2018-03-21 DIAGNOSIS — R059 Cough, unspecified: Secondary | ICD-10-CM

## 2018-03-21 DIAGNOSIS — R05 Cough: Secondary | ICD-10-CM | POA: Diagnosis not present

## 2018-03-21 MED ORDER — HYDROCOD POLST-CPM POLST ER 10-8 MG/5ML PO SUER: 5.0000 mL | Freq: Two times a day (BID) | ORAL | 0 refills | Status: DC | PRN

## 2018-03-21 MED ORDER — BUDESONIDE-FORMOTEROL FUMARATE 160-4.5 MCG/ACT IN AERO: 2.0000 | INHALATION_SPRAY | Freq: Two times a day (BID) | RESPIRATORY_TRACT | 12 refills | Status: DC

## 2018-03-21 MED ORDER — PANTOPRAZOLE SODIUM 40 MG PO TBEC: 40.0000 mg | DELAYED_RELEASE_TABLET | Freq: Every day | ORAL | 1 refills | Status: DC

## 2018-03-21 NOTE — Patient Instructions (Signed)
START SYMBICORT TWICE DAILY GET CXR START PROTONIX REFER TO CARDIOLOGY FOR CHEST PAIN

## 2018-03-21 NOTE — Progress Notes (Signed)
Name: [name] MRN: [id number] DOB: September 21, 1971     CONSULTATION DATE: 6.18.19 REFERRING MD : Holley Raring  CHIEF COMPLAINT: SOB/Cough  STUDIES:     CXR independently reviewed by Me 12/2013-no acute opacities, no pneumonia   HISTORY OF PRESENT ILLNESS: 47 yo white male seen today for cough which has been going on for the last 4 to 5 months Patient also history of chronic kidney disease stage III has been noted to have lower extremity swelling for the last several months specially in the ankles Patient has intermittent wheezing Patient has uncontrolled reflux and he states that his cough got better when he was treated with Zantac but his cough came back several days later Patient has been having intermittent chest pain on and off for the last 1 month approximately 3 times Patient is insulin-dependent diabetes with insulin pump  Patient has been diagnosed with sleep apnea and has been under therapy for the last 6 months Patient has gained weight over the last several months approximately 20 pounds  Several months ago he was diagnosed with bronchitis and was given antibiotics Tessalon Perles and albuterol inhaler which did not help  Patient states he is had reactive airways disease as a child but no respiratory issues growing up He was diagnosed with pneumonia 2 years ago  Patient is a non-smoker No secondhand smoke exposure  Patient states that he has some triggers that elicit his cough which include dust and extreme laughing  Patient has no signs or symptoms of infection at this time    PAST MEDICAL HISTORY :   has a past medical history of CKD (chronic kidney disease), stage II, Gastroparesis, Herniated cervical disc, HTN (hypertension), Hypercholesteremia, S/P cardiac cath, Sinus tachycardia, and Type 1 diabetes mellitus ([place]).  has a past surgical history that includes Hernia repair; cervical neck fusion; and left heart catheterization with coronary angiogram (N/A,  12/18/2013). Prior to Admission medications   Medication Sig Start Date End Date Taking? Authorizing Provider  Albuterol Sulfate 108 (90 Base) MCG/ACT AEPB Inhale 2 puffs into the lungs every 4 (four) hours as needed. 02/02/18  Yes Scot Jun, FNP  amLODipine (NORVASC) 5 MG tablet Take 10 mg by mouth daily. 2 tablets once a day 01/25/17  Yes [provider]  atorvastatin (LIPITOR) 40 MG tablet Take 80 mg by mouth daily.    Yes [provider]  BAYER CONTOUR NEXT TEST test strip Inject 1 strip into the skin 4 (four) times daily as needed. 12/23/16  Yes [provider]  HUMALOG 100 UNIT/ML injection Inject 100 Units into the skin daily. 11/11/16  Yes [provider]  losartan (COZAAR) 50 MG tablet Take 100 mg by mouth daily.    Yes [provider]   Allergies  Allergen Reactions  . Oxycontin [Oxycodone Hcl] Nausea And Vomiting  . Penicillins Other (See Comments)    Possible reaction many years ago per patient  . Prednisone Other (See Comments)    Patient is diabetic, runs sugar up  . Shellfish-Derived Products     Pt is not allergic to iodine, has had iodine in the past w/o premeds and w/ no problems    FAMILY HISTORY:  family history includes Coronary artery disease in his unknown relative; Diabetes in his unknown relative; Hypertension in his unknown relative; Stroke in his unknown relative. SOCIAL HISTORY:  reports that he has never smoked. He has never used smokeless tobacco. He reports that he does not drink alcohol or use drugs.  REVIEW OF SYSTEMS:   Constitutional: Negative for fever, chills, weight loss, malaise/fatigue and diaphoresis.  HENT: Negative for hearing loss, ear pain, nosebleeds, congestion, sore throat, neck pain, tinnitus and ear discharge.   Eyes: Negative for blurred vision, double vision, photophobia, pain, discharge and redness.  Respiratory: + Cough,-hemoptysis,-sputum production,-shortness of breath, + wheezing  and-stridor.   Cardiovascular: +chest pain,  +leg swelling   Gastrointestinal: + Heartburn,- nausea, -vomiting, -abdominal pain, -diarrhea, -constipation, -blood in stool and -melena.  Genitourinary: Negative for dysuria, urgency, frequency, hematuria and flank pain.  Musculoskeletal: Negative for myalgias, back pain, joint pain and falls.  Skin: Negative for itching and rash.  Neurological: Negative for dizziness, tingling, tremors, sensory change, speech change, focal weakness, seizures, loss of consciousness, weakness and headaches.  Endo/Heme/Allergies: Negative for environmental allergies and polydipsia. Does not bruise/bleed easily.  ALL OTHER ROS ARE NEGATIVE  BP 120/86 (BP Location: Left Arm, Cuff Size: Normal)   Pulse 88   Ht 5\' 11"  (1.803 m)   Wt 261 lb (118.4 kg)   SpO2 98%   BMI 36.40 kg/m    Physical Examination:   GENERAL:NAD, no fevers, chills, no weakness no fatigue HEAD: Normocephalic, atraumatic.  EYES: Pupils equal, round, reactive to light. Extraocular muscles intact. No scleral icterus.  MOUTH: Moist mucosal membrane.   EAR, NOSE, THROAT: Clear without exudates. No external lesions.  NECK: Supple. No thyromegaly. No nodules. No JVD.  PULMONARY:CTA B/L no wheezes, no crackles, no rhonchi CARDIOVASCULAR: S1 and S2. Regular rate and rhythm. No murmurs, rubs, or gallops. 1-2+ edema.  GASTROINTESTINAL: Soft, nontender, nondistended. No masses. Positive bowel sounds.  MUSCULOSKELETAL: No swelling, clubbing, or edema. Range of motion full in all extremities.  NEUROLOGIC: Cranial nerves II through XII are intact. No gross focal neurological deficits.  SKIN: No ulceration, lesions, rashes, or cyanosis. Skin warm and dry. Turgor intact.  PSYCHIATRIC: Mood, affect within normal limits. The patient is awake, alert and oriented x 3. Insight, judgment intact.      ASSESSMENT / PLAN: 47 year old pleasant white male seen today for chronic cough in the setting of  intermittent wheezing associated with uncontrolled reflux and lower extremities swelling symptoms are most likely related to underlying reactive airways disease and cough variant asthma also in the setting of chest pain and lower extremity swelling.  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 in the setting of normal office spirometry testing in the office Recommend starting Symbicort 160 twice daily Albuterol as needed Avoid triggers Patient advised to rinse mouth after every use Check chest x-ray two-view  #2 GERD uncontrolled  recommend starting Protonix  #3 chest pain with a history of diabetes Recommend cardiology referral for further evaluation  #4 OSA Recommend compliance report at next visit  #5 lower extremity swelling Patient has chronic kidney disease stage III need to assess for diastolic heart failure recommend cardiology referral  Patient satisfied with Plan of action and management. All questions answered Follow up in 4 weeks   Navea Woodrow Patricia Pesa, M.D.  [facility]  Medical Director [place] Director [facility] Cardio-Pulmonary Department

## 2018-03-22 ENCOUNTER — Ambulatory Visit
Admission: RE | Admit: 2018-03-22 | Discharge: 2018-03-22 | Disposition: A | Discharge status: Home / Self Care | Payer: 59 | Source: Ambulatory Visit | Attending: Internal Medicine | Admitting: Internal Medicine

## 2018-03-22 DIAGNOSIS — E109 Type 1 diabetes mellitus without complications: Secondary | ICD-10-CM | POA: Diagnosis not present

## 2018-03-22 DIAGNOSIS — R0602 Shortness of breath: Secondary | ICD-10-CM | POA: Diagnosis not present

## 2018-03-22 DIAGNOSIS — R05 Cough: Secondary | ICD-10-CM | POA: Insufficient documentation

## 2018-03-22 DIAGNOSIS — E1065 Type 1 diabetes mellitus with hyperglycemia: Secondary | ICD-10-CM | POA: Diagnosis not present

## 2018-03-22 DIAGNOSIS — R059 Cough, unspecified: Secondary | ICD-10-CM

## 2018-03-31 ENCOUNTER — Encounter: Payer: Self-pay | Admitting: Cardiovascular Disease

## 2018-03-31 ENCOUNTER — Ambulatory Visit: Payer: 59 | Admitting: Cardiovascular Disease

## 2018-03-31 VITALS — BP 128/78 | HR 86 | Ht 71.0 in | Wt 261.5 lb

## 2018-03-31 DIAGNOSIS — R0602 Shortness of breath: Secondary | ICD-10-CM | POA: Diagnosis not present

## 2018-03-31 DIAGNOSIS — E785 Hyperlipidemia, unspecified: Secondary | ICD-10-CM | POA: Diagnosis not present

## 2018-03-31 DIAGNOSIS — R600 Localized edema: Secondary | ICD-10-CM | POA: Diagnosis not present

## 2018-03-31 DIAGNOSIS — R079 Chest pain, unspecified: Secondary | ICD-10-CM

## 2018-03-31 MED ORDER — AMLODIPINE BESYLATE 5 MG PO TABS: 5.0000 mg | ORAL_TABLET | Freq: Every day | ORAL | Status: DC

## 2018-03-31 NOTE — Patient Instructions (Signed)
Medication Instructions: DECREASE Amlodipine 5 mg daily.  If you need a refill on your cardiac medications before your next appointment, please call your pharmacy.   Procedures/Testing: Your physician has requested that you have an echocardiogram. Echocardiography is a painless test that uses sound waves to create images of your heart. It provides your doctor with information about the size and shape of your heart and how well your heart's chambers and valves are working. You may receive an ultrasound enhancing agent through an IV if needed to better visualize your heart during the echo.This procedure takes approximately one hour. There are no restrictions for this procedure. This will take place at the [facility] clinic.   Your physician has requested that you have an exercise stress myoview. For further information please visit [email or web address]. Please follow instruction sheet, as given.   Follow-Up: Your physician wants you to follow-up in 2 months with Dr. Fletcher Anon.    Thank you for choosing Heart[facility] at [facility]!     [place]  Your provider has ordered a Stress Test with nuclear imaging. The purpose of this test is to evaluate the blood supply to your heart muscle. This procedure is referred to as a "Non-Invasive Stress Test." This is because other than having an IV started in your vein, nothing is inserted or "invades" your body. Cardiac stress tests are done to find areas of poor blood flow to the heart by determining the extent of coronary artery disease (CAD). Some patients exercise on a treadmill, which naturally increases the blood flow to your heart, while others who are unable to walk on a treadmill due to physical limitations have a pharmacologic/chemical stress agent called Lexiscan . This medicine will mimic walking on a treadmill by temporarily increasing your coronary blood flow.   Please note: these test may take anywhere between 2-4 hours to  complete  PLEASE REPORT TO [place] AT THE FIRST DESK WILL DIRECT YOU WHERE TO GO  Date of Procedure:_____________________________________  Arrival Time for Procedure:______________________________  Instructions regarding medication:   _X___ : Hold diabetes medication morning of procedure  ____:  Hold betablocker(s) night before procedure and morning of procedure  ____:  Hold other medications as follows:_________________________________________________________________________________________________________________________________________________________________________________________________________________________________________________________________________________________  PLEASE NOTIFY THE OFFICE AT LEAST 24 HOURS IN ADVANCE IF YOU ARE UNABLE TO KEEP YOUR APPOINTMENT.  [phone] AND  PLEASE NOTIFY NUCLEAR MEDICINE AT [facility] AT LEAST 24 HOURS IN ADVANCE IF YOU ARE UNABLE TO KEEP YOUR APPOINTMENT. [phone]  How to prepare for your Myoview test:  1. Do not eat or drink after midnight 2. No caffeine for 24 hours prior to test 3. No smoking 24 hours prior to test. 4. Your medication may be taken with water.  If your doctor stopped a medication because of this test, do not take that medication. 5. Ladies, please do not wear dresses.  Skirts or pants are appropriate. Please wear a short sleeve shirt. 6. No perfume, cologne or lotion. 7. Wear comfortable walking shoes. No heels!

## 2018-03-31 NOTE — Progress Notes (Signed)
Cardiology Office Note   Date:  03/31/2018   ID:  [name], DOB 1970/12/29, MRN [id number]  PCP:  Dion Body, MD  Cardiologist:   Kathlyn Sacramento, MD   Chief Complaint  Patient presents with  . other    Former patient of Dr. Kyla Balzarine. Meds reviewed by the pt. verbally. Pt. c/o LE edema, shortness of breath & has chest pain several times a month.      History of Present Illness: [name] is a 47 y.o. male who was referred by Dr. Mortimer Fries for evaluation of leg edema, shortness of breath and chest pain.  He has known history of type 1 diabetes, chronic kidney disease, sleep apnea,  hypertension and hyperlipidemia.  He had cardiac catheterization in the remote past due to tachycardia which was reportedly normal.   Previous Holter monitor in 2013 showed occasional PVCs and average heart rate of 96 bpm. He was seen by Dr. Johnsie Cancel in 2013 for atypical chest pain.  He had a normal treadmill stress test.  Echocardiogram showed normal LV systolic function with no significant valvular abnormalities. He describes sharp left-sided chest discomfort lasting for 15 to 20 seconds which usually happens at rest and not with physical activities.  He does complain of exertional dyspnea.  He has been diabetic for 22 years. He complains of leg edema worse on the left side.   Past Medical History:  Diagnosis Date  . CKD (chronic kidney disease), stage II   . Gastroparesis   . Herniated cervical disc   . HTN (hypertension)   . Hypercholesteremia   . S/P cardiac cath    a. 10-15 yrs ago at [facility] due to tachycardia, reportedly normal. b. Normal ETT 03/2012.  Marland Kitchen Sinus tachycardia    a. 24-hr Holter 03/2012 - SR, occ PVCs, no VT, avg HR 96bpm.  . Type 1 diabetes mellitus ([place])    a. With insulin pump.    Past Surgical History:  Procedure Laterality Date  . cervical neck fusion    . HERNIA REPAIR     bilateral  . LEFT HEART CATHETERIZATION WITH CORONARY ANGIOGRAM N/A 12/18/2013   Procedure: LEFT HEART CATHETERIZATION WITH CORONARY ANGIOGRAM;  Surgeon: [name] M Martinique, MD;  Location: [facility] CATH LAB;  Service: Cardiovascular;  Laterality: N/A;     Current Outpatient Medications  Medication Sig Dispense Refill  . amLODipine (NORVASC) 5 MG tablet Take 10 mg by mouth daily. 2 tablets once a day    . atorvastatin (LIPITOR) 40 MG tablet Take 80 mg by mouth daily.     Marland Kitchen BAYER CONTOUR NEXT TEST test strip Inject 1 strip into the skin 4 (four) times daily as needed.  11  . budesonide-formoterol (SYMBICORT) 160-4.5 MCG/ACT inhaler Inhale 2 puffs into the lungs 2 (two) times daily. 1 Inhaler 12  . chlorpheniramine-HYDROcodone (TUSSIONEX PENNKINETIC ER) 10-8 MG/5ML SUER Take 5 mLs by mouth every 12 (twelve) hours as needed for cough. 140 mL 0  . HUMALOG 100 UNIT/ML injection Inject 100 Units into the skin daily.  11  . losartan (COZAAR) 50 MG tablet Take 100 mg by mouth daily.     . pantoprazole (PROTONIX) 40 MG tablet Take 1 tablet (40 mg total) by mouth daily. 30 tablet 1   No current facility-administered medications for this visit.     Allergies:   Oxycontin [oxycodone hcl]; Penicillins; Prednisone; and Shellfish-derived products    Social History:  The patient  reports that he has never smoked. He has never  used smokeless tobacco. He reports that he does not drink alcohol or use drugs.   Family History:  The patient's family history includes Coronary artery disease in his unknown relative; Diabetes in his unknown relative; Hypertension in his unknown relative; Stroke in his unknown relative.    ROS:  Please see the history of present illness.   Otherwise, review of systems are positive for none.   All other systems are reviewed and negative.    PHYSICAL EXAM: VS:  BP 128/78 (BP Location: Right Arm, Patient Position: Sitting, Cuff Size: Normal)   Pulse 86   Ht 5\' 11"  (1.803 m)   Wt 261 lb 8 oz (118.6 kg)   BMI 36.47 kg/m  , BMI Body mass index is 36.47 kg/m. GEN: Well  nourished, well developed, in no acute distress  HEENT: normal  Neck: no JVD, carotid bruits, or masses Cardiac: RRR; no murmurs, rubs, or gallops, moderate bilateral leg edema  Respiratory:  clear to auscultation bilaterally, normal work of breathing GI: soft, nontender, nondistended, + BS MS: no deformity or atrophy  Skin: warm and dry, no rash Neuro:  Strength and sensation are intact Psych: euthymic mood, full affect   EKG:  EKG is ordered today. The ekg ordered today demonstrates normal sinus rhythm with no significant ST or T wave changes.   Recent Labs: No results found for requested labs within last 8760 hours.    Lipid Panel    Component Value Date/Time   CHOL 131 12/18/2013 0215   TRIG 102 12/18/2013 0215   HDL 51 12/18/2013 0215   CHOLHDL 2.6 12/18/2013 0215   VLDL 20 12/18/2013 0215   LDLCALC 60 12/18/2013 0215      Wt Readings from Last 3 Encounters:  03/31/18 261 lb 8 oz (118.6 kg)  03/21/18 261 lb (118.4 kg)  02/02/18 250 lb (113.4 kg)      PAD Screen 03/31/2018  Previous PAD dx? No  Previous surgical procedure? No  Pain with walking? No  Feet/toe relief with dangling? No  Painful, non-healing ulcers? No  Extremities discolored? No      ASSESSMENT AND PLAN:  1.  Atypical chest pain: The chest pain is overall atypical but he has significant exertional dyspnea and multiple risk factors for coronary artery disease including prolonged history of diabetes mellitus.  Due to that, I recommend evaluation with a treadmill nuclear stress test.  2.  Exertional dyspnea: I requested an echocardiogram to evaluate diastolic function and pulmonary pressure especially with significant leg edema.  3.  Bilateral leg edema: Possible component of chronic venous insufficiency but I also suspect that amlodipine as well as chronic kidney disease are contributing.  Evaluate cardiac status as outlined above.  I decreased amlodipine 5 mg daily.  6.  Hyperlipidemia:  Currently on atorvastatin 81 mg daily.  Recommend a target LDL of less than 70 given that he is diabetic.    Disposition:   FU with me in 2 months  Signed,  Kathlyn Sacramento, MD  03/31/2018 3:00 PM    [place]

## 2018-04-05 DIAGNOSIS — N183 Chronic kidney disease, stage 3 (moderate): Secondary | ICD-10-CM | POA: Diagnosis not present

## 2018-04-05 DIAGNOSIS — D638 Anemia in other chronic diseases classified elsewhere: Secondary | ICD-10-CM | POA: Diagnosis not present

## 2018-04-05 DIAGNOSIS — I10 Essential (primary) hypertension: Secondary | ICD-10-CM | POA: Diagnosis not present

## 2018-04-05 DIAGNOSIS — E7800 Pure hypercholesterolemia, unspecified: Secondary | ICD-10-CM | POA: Diagnosis not present

## 2018-04-10 ENCOUNTER — Other Ambulatory Visit: Payer: Self-pay

## 2018-04-10 ENCOUNTER — Ambulatory Visit (INDEPENDENT_AMBULATORY_CARE_PROVIDER_SITE_OTHER): Payer: 59

## 2018-04-10 DIAGNOSIS — R079 Chest pain, unspecified: Secondary | ICD-10-CM | POA: Diagnosis not present

## 2018-04-10 DIAGNOSIS — R0602 Shortness of breath: Secondary | ICD-10-CM | POA: Diagnosis not present

## 2018-04-10 MED ORDER — PERFLUTREN LIPID MICROSPHERE
1.0000 mL | INTRAVENOUS | Status: AC | PRN
  Administered 2018-04-10: 2 mL via INTRAVENOUS

## 2018-04-11 ENCOUNTER — Encounter
Admission: RE | Admit: 2018-04-11 | Discharge: 2018-04-11 | Disposition: A | Discharge status: Home / Self Care | Payer: 59 | Source: Ambulatory Visit | Attending: Cardiovascular Disease | Admitting: Cardiovascular Disease

## 2018-04-11 DIAGNOSIS — R079 Chest pain, unspecified: Secondary | ICD-10-CM

## 2018-04-11 LAB — NM MYOCAR MULTI W/SPECT W/WALL MOTION / EF
Estimated workload: 10.4 METS
Exercise duration (min): 11 min
Exercise duration (sec): 0 s
LV dias vol: 102 mL (ref 62–150)
LV sys vol: 35 mL
MPHR: 175 {beats}/min
Peak HR: 150 {beats}/min
Percent HR: 85 %
Rest HR: 92 {beats}/min
TID: 0.78

## 2018-04-11 MED ORDER — TECHNETIUM TC 99M TETROFOSMIN IV KIT
12.7500 | PACK | Freq: Once | INTRAVENOUS | Status: AC | PRN
  Administered 2018-04-11: 12.75 via INTRAVENOUS

## 2018-04-11 MED ORDER — TECHNETIUM TC 99M TETROFOSMIN IV KIT
30.9900 | PACK | Freq: Once | INTRAVENOUS | Status: AC | PRN
  Administered 2018-04-11: 30.99 via INTRAVENOUS

## 2018-04-13 DIAGNOSIS — I10 Essential (primary) hypertension: Secondary | ICD-10-CM | POA: Diagnosis not present

## 2018-04-13 DIAGNOSIS — N183 Chronic kidney disease, stage 3 (moderate): Secondary | ICD-10-CM | POA: Diagnosis not present

## 2018-04-13 DIAGNOSIS — E1065 Type 1 diabetes mellitus with hyperglycemia: Secondary | ICD-10-CM | POA: Diagnosis not present

## 2018-04-18 ENCOUNTER — Encounter: Payer: Self-pay | Admitting: Podiatry

## 2018-04-18 ENCOUNTER — Ambulatory Visit: Payer: 59 | Admitting: Podiatry

## 2018-04-18 ENCOUNTER — Telehealth: Payer: Self-pay

## 2018-04-18 ENCOUNTER — Other Ambulatory Visit: Payer: Self-pay

## 2018-04-18 DIAGNOSIS — L97522 Non-pressure chronic ulcer of other part of left foot with fat layer exposed: Secondary | ICD-10-CM

## 2018-04-18 DIAGNOSIS — E0843 Diabetes mellitus due to underlying condition with diabetic autonomic (poly)neuropathy: Secondary | ICD-10-CM | POA: Diagnosis not present

## 2018-04-18 DIAGNOSIS — I70245 Atherosclerosis of native arteries of left leg with ulceration of other part of foot: Secondary | ICD-10-CM

## 2018-04-18 MED ORDER — GENTAMICIN SULFATE 0.1 % EX CREA: 1.0000 "application " | TOPICAL_CREAM | Freq: Three times a day (TID) | CUTANEOUS | 1 refills | Status: DC

## 2018-04-18 MED ORDER — DOXYCYCLINE HYCLATE 100 MG PO TABS: 100.0000 mg | ORAL_TABLET | Freq: Two times a day (BID) | ORAL | 0 refills | Status: DC

## 2018-04-18 MED ORDER — PAPAVERINE-PHENTOLAMINE 30-1 MG/ML IC SOLN
0.3000 mL | INTRACAVERNOUS | Status: DC
Start: 2018-04-18 — End: 2019-10-03

## 2018-04-18 NOTE — Telephone Encounter (Signed)
[facility] called stating pt would like to add lidocaine to his Trimix rx due to burning and pain when injecting. Per Dr. Bernardo Heater, he is not comfortable ding this at this time. I spoke with pt and he states that its not the injection that hurts, it is the after effects. He states when he obtains an erection it feels like fire and he can't tolerate it. Pt states Bimix worked but only when he gave himself 2-3 shots and then it was painful from the shot and almost felt numb. Pt is coming in on the 26th and will discuss with you.

## 2018-04-18 NOTE — Telephone Encounter (Signed)
Noted  

## 2018-04-20 ENCOUNTER — Ambulatory Visit: Payer: Self-pay | Admitting: Internal Medicine

## 2018-04-21 ENCOUNTER — Ambulatory Visit: Payer: Self-pay | Admitting: Cardiovascular Disease

## 2018-04-24 DIAGNOSIS — E1022 Type 1 diabetes mellitus with diabetic chronic kidney disease: Secondary | ICD-10-CM | POA: Diagnosis not present

## 2018-04-24 DIAGNOSIS — N183 Chronic kidney disease, stage 3 (moderate): Secondary | ICD-10-CM | POA: Diagnosis not present

## 2018-04-24 DIAGNOSIS — E875 Hyperkalemia: Secondary | ICD-10-CM | POA: Diagnosis not present

## 2018-04-24 DIAGNOSIS — I10 Essential (primary) hypertension: Secondary | ICD-10-CM | POA: Diagnosis not present

## 2018-04-27 NOTE — Progress Notes (Signed)
   Subjective:  47 year old male with PMHx of T1DM presenting today with a chief complaint of a callus lesion noted to the left hallux that has been present for the past 1-2 weeks. He reports associated cracking and believes an ulceration is now forming. He reports some redness and mild drainage from the area. He has been cleaning the area with peroxide and applying Neosporin. There are no modifying factors noted. Patient is here for further evaluation and treatment.   Past Medical History:  Diagnosis Date  . CKD (chronic kidney disease), stage II   . Gastroparesis   . Herniated cervical disc   . HTN (hypertension)   . Hypercholesteremia   . S/P cardiac cath    a. 10-15 yrs ago at [facility] due to tachycardia, reportedly normal. b. Normal ETT 03/2012.  Marland Kitchen Sinus tachycardia    a. 24-hr Holter 03/2012 - SR, occ PVCs, no VT, avg HR 96bpm.  . Type 1 diabetes mellitus ([place])    a. With insulin pump.      Objective/Physical Exam General: The patient is alert and oriented x3 in no acute distress.  Dermatology:  Wound #1 noted to the left hallux measuring 2.0 x 2.0 x 0.1 cm (LxWxD).   To the noted ulceration(s), there is no eschar. There is a moderate amount of slough, fibrin, and necrotic tissue noted. Granulation tissue and wound base is red. There is a minimal amount of serosanguineous drainage noted. There is no exposed bone muscle-tendon ligament or joint. There is no malodor. Periwound integrity is intact. Skin is warm, dry and supple bilateral lower extremities.  Vascular: Palpable pedal pulses bilaterally. No edema or erythema noted. Capillary refill within normal limits.  Neurological: Epicritic and protective threshold diminished bilaterally.   Musculoskeletal Exam: Range of motion within normal limits to all pedal and ankle joints bilateral. Muscle strength 5/5 in all groups bilateral.   Assessment: #1 ulceration of the left hallux secondary to diabetes mellitus #2 diabetes mellitus w/  peripheral neuropathy   Plan of Care:  #1 Patient was evaluated. #2 medically necessary excisional debridement including subcutaneous tissue was performed using a tissue nipper and a chisel blade. Excisional debridement of all the necrotic nonviable tissue down to healthy bleeding viable tissue was performed with post-debridement measurements same as pre-. #3 the wound was cleansed and dry sterile dressing applied. #4 Post op shoe dispensed. Weightbearing as tolerated.  #5 Prescription for gentamicin cream provided to patient.  #6 Prescription for Doxycycline 100 mg #20 provided to patient.  #7 Referral to [facility] wound care center. Patient works at [employer].  #8 patient is to return to clinic as needed.   Edrick Kins, DPM [facility]  Dr. Edrick Kins, [address]                                        [place], [place] [address]                Office [phone]  Fax [phone]

## 2018-04-28 ENCOUNTER — Encounter: Payer: Self-pay | Admitting: Urology

## 2018-04-28 ENCOUNTER — Ambulatory Visit: Payer: 59 | Admitting: Urology

## 2018-04-28 VITALS — BP 144/81 | HR 92 | Ht 71.0 in | Wt 250.0 lb

## 2018-04-28 DIAGNOSIS — N5201 Erectile dysfunction due to arterial insufficiency: Secondary | ICD-10-CM | POA: Diagnosis not present

## 2018-04-28 NOTE — Progress Notes (Signed)
04/28/2018 4:02 PM   [name] Feb 01, 1971 [id number]  Referring provider: Dion Body, MD [place] [facility] [place], [place] [address]  Chief Complaint  Patient presents with  . Erectile Dysfunction    HPI: 47 year old male presents for follow-up of ED.  He had good results with Trimix however had significant burning penile sensation.  He was tried on papaverine/phentolamine and did not have the burning however did not achieve an adequate erection with a significant dose.  His compounding pharmacy offered to add lidocaine to the Trimix.   PMH: Past Medical History:  Diagnosis Date  . CKD (chronic kidney disease), stage II   . Gastroparesis   . Herniated cervical disc   . HTN (hypertension)   . Hypercholesteremia   . S/P cardiac cath    a. 10-15 yrs ago at [facility] due to tachycardia, reportedly normal. b. Normal ETT 03/2012.  Marland Kitchen Sinus tachycardia    a. 24-hr Holter 03/2012 - SR, occ PVCs, no VT, avg HR 96bpm.  . Type 1 diabetes mellitus ([place])    a. With insulin pump.    Surgical History: Past Surgical History:  Procedure Laterality Date  . cervical neck fusion    . HERNIA REPAIR     bilateral  . LEFT HEART CATHETERIZATION WITH CORONARY ANGIOGRAM N/A 12/18/2013   Procedure: LEFT HEART CATHETERIZATION WITH CORONARY ANGIOGRAM;  Surgeon: [name] M Martinique, MD;  Location: [facility] CATH LAB;  Service: Cardiovascular;  Laterality: N/A;    Home Medications:  Allergies as of 04/28/2018      Reactions   Oxycontin [oxycodone Hcl] Nausea And Vomiting   Penicillins Other (See Comments)   Possible reaction many years ago per patient   Prednisone Other (See Comments)   Patient is diabetic, runs sugar up   Shellfish-derived Products    Pt is not allergic to iodine, has had iodine in the past w/o premeds and w/ no problems      Medication List        Accurate as of 04/28/18  4:02 PM. Always use your most recent med list.          amLODipine 5 MG  tablet Commonly known as:  NORVASC Take 1 tablet (5 mg total) by mouth daily.   atorvastatin 80 MG tablet Commonly known as:  LIPITOR Take 80 mg by mouth daily.   BAYER CONTOUR NEXT TEST test strip Generic drug:  glucose blood Inject 1 strip into the skin 4 (four) times daily as needed.   budesonide-formoterol 160-4.5 MCG/ACT inhaler Commonly known as:  SYMBICORT Inhale 2 puffs into the lungs 2 (two) times daily.   doxycycline 100 MG tablet Commonly known as:  VIBRA-TABS Take 1 tablet (100 mg total) by mouth 2 (two) times daily.   furosemide 40 MG tablet Commonly known as:  LASIX Take 40 mg by mouth daily.   gentamicin cream 0.1 % Commonly known as:  GARAMYCIN Apply 1 application topically 3 (three) times daily.   HUMALOG 100 UNIT/ML injection Generic drug:  insulin lispro Inject 100 Units into the skin daily.   losartan 100 MG tablet Commonly known as:  COZAAR Take 100 mg by mouth daily.   pantoprazole 40 MG tablet Commonly known as:  PROTONIX Take 1 tablet (40 mg total) by mouth daily.   Papaverine-Phentolamine 30-1 MG/ML Soln 0.3 mLs by Intracavernosal route as directed.       Allergies:  Allergies  Allergen Reactions  . Oxycontin [Oxycodone Hcl] Nausea And Vomiting  .  Penicillins Other (See Comments)    Possible reaction many years ago per patient  . Prednisone Other (See Comments)    Patient is diabetic, runs sugar up  . Shellfish-Derived Products     Pt is not allergic to iodine, has had iodine in the past w/o premeds and w/ no problems    Family History: Family History  Problem Relation Age of Onset  . Hypertension Unknown   . Coronary artery disease Unknown        Mother's side - both her side's grandparents died of heart disease (MIs)  . Diabetes Unknown   . Stroke Unknown        Paternal grandfather (76)  . Prostate cancer Neg Hx   . Bladder Cancer Neg Hx   . Kidney cancer Neg Hx     Social History:  reports that he has never smoked.  He has never used smokeless tobacco. He reports that he does not drink alcohol or use drugs.  ROS: UROLOGY Frequent Urination?: No Hard to postpone urination?: No Burning/pain with urination?: No Get up at night to urinate?: No Leakage of urine?: No Urine stream starts and stops?: No Trouble starting stream?: No Do you have to strain to urinate?: No Blood in urine?: No Urinary tract infection?: No Sexually transmitted disease?: No Injury to kidneys or bladder?: No Painful intercourse?: No Weak stream?: No Erection problems?: Yes Penile pain?: No  Gastrointestinal Nausea?: No Vomiting?: No Indigestion/heartburn?: No Diarrhea?: No Constipation?: No  Constitutional Fever: No Night sweats?: No Weight loss?: No Fatigue?: No  Skin Skin rash/lesions?: No Itching?: No  Eyes Blurred vision?: No Double vision?: No  Ears/Nose/Throat Sore throat?: No Sinus problems?: No  Hematologic/Lymphatic Swollen glands?: No Easy bruising?: No  Cardiovascular Leg swelling?: No Chest pain?: No  Respiratory Cough?: No Shortness of breath?: No  Endocrine Excessive thirst?: No  Musculoskeletal Back pain?: No Joint pain?: No  Neurological Headaches?: No Dizziness?: No  Psychologic Depression?: No Anxiety?: No  Physical Exam: BP (!) 144/81   Pulse 92   Ht 5\' 11"  (1.803 m)   Wt 250 lb (113.4 kg)   BMI 34.87 kg/m   Constitutional:  Alert and oriented, No acute distress. HEENT: [place] AT, moist mucus membranes.  Trachea midline, no masses. Cardiovascular: No clubbing, cyanosis, or edema. Respiratory: Normal respiratory effort, no increased work of breathing. GI: Abdomen is soft, nontender, nondistended, no abdominal masses GU: No CVA tenderness Lymph: No cervical or inguinal lymphadenopathy. Skin: No rashes, bruises or suspicious lesions. Neurologic: Grossly intact, no focal deficits, moving all 4 extremities. Psychiatric: Normal mood and affect.   Assessment &  Plan:   I did review the literature and there are supporting studies that addition of 1% lidocaine without epinephrine to Trimix resolves penile pain without safety concerns. Will approve this modification.   Abbie Sons, [place] [address] [place], [place] [address] [phone]

## 2018-04-30 ENCOUNTER — Encounter: Payer: Self-pay | Admitting: Urology

## 2018-05-01 ENCOUNTER — Telehealth: Payer: Self-pay

## 2018-05-01 NOTE — Telephone Encounter (Signed)
-----   Message from Abbie Sons, MD sent at 04/30/2018  2:14 PM EDT ----- Please check with [facility] the amount and strength of lidocaine they add to tri-mix

## 2018-05-02 NOTE — Telephone Encounter (Signed)
Spoke w/ [facility], they said it is 10mg  / 40ml

## 2018-05-03 NOTE — Telephone Encounter (Signed)
Called into [facility]

## 2018-05-03 NOTE — Telephone Encounter (Signed)
Okay to add lidocaine to Trimix.  Please notify patient.

## 2018-05-09 ENCOUNTER — Telehealth: Payer: Self-pay

## 2018-05-09 ENCOUNTER — Ambulatory Visit: Payer: 59 | Admitting: Podiatry

## 2018-05-09 NOTE — Telephone Encounter (Signed)
-----   Message from Edrick Kins, DPM sent at 04/18/2018 11:52 AM EDT ----- Regarding: referral Please refer patient to [facility] wound care center  Dx: DFU left hallux

## 2018-05-09 NOTE — Telephone Encounter (Signed)
I spoke with [name] at wound care center, she stated that patient refused to schedule appt because time/date was far out.    I called and left message for patient to call back to discuss appt.

## 2018-05-09 NOTE — Telephone Encounter (Signed)
-----   Message from Edrick Kins, DPM sent at 04/18/2018 11:52 AM EDT ----- Regarding: referral Please refer patient to [facility] wound care center  Dx: DFU left hallux

## 2018-05-09 NOTE — Telephone Encounter (Signed)
I spoke with patient, he stated that his foot is doing much better and did not want to see wound care center and also cancelled his appt with Dr. Amalia Hailey today.  He stated that he will call back if he starts to have any problems with foot again.

## 2018-05-22 ENCOUNTER — Ambulatory Visit: Payer: Self-pay | Admitting: Cardiovascular Disease

## 2018-05-23 ENCOUNTER — Encounter: Payer: Self-pay | Admitting: Podiatry

## 2018-05-23 ENCOUNTER — Ambulatory Visit: Payer: 59 | Admitting: Podiatry

## 2018-05-23 DIAGNOSIS — E0843 Diabetes mellitus due to underlying condition with diabetic autonomic (poly)neuropathy: Secondary | ICD-10-CM | POA: Diagnosis not present

## 2018-05-23 DIAGNOSIS — L97522 Non-pressure chronic ulcer of other part of left foot with fat layer exposed: Secondary | ICD-10-CM | POA: Diagnosis not present

## 2018-05-23 DIAGNOSIS — I70245 Atherosclerosis of native arteries of left leg with ulceration of other part of foot: Secondary | ICD-10-CM

## 2018-05-23 MED ORDER — DOXYCYCLINE HYCLATE 100 MG PO TABS: 100.0000 mg | ORAL_TABLET | Freq: Two times a day (BID) | ORAL | 0 refills | Status: DC

## 2018-05-25 NOTE — Progress Notes (Signed)
   Subjective:  47 year old male with PMHx of T1DM presenting today for follow up evaluation of an ulceration of the left hallux. He reports associated redness and swelling with some burning pain that began this morning. He states he finished his last dose of Doxycycline last night. There are no modifying factors noted. He denies fever or chills. Patient is here for further evaluation and treatment.   Past Medical History:  Diagnosis Date  . CKD (chronic kidney disease), stage II   . Gastroparesis   . Herniated cervical disc   . HTN (hypertension)   . Hypercholesteremia   . S/P cardiac cath    a. 10-15 yrs ago at [facility] due to tachycardia, reportedly normal. b. Normal ETT 03/2012.  Marland Kitchen Sinus tachycardia    a. 24-hr Holter 03/2012 - SR, occ PVCs, no VT, avg HR 96bpm.  . Type 1 diabetes mellitus ([place])    a. With insulin pump.      Objective/Physical Exam General: The patient is alert and oriented x3 in no acute distress.  Dermatology:  Wound #1 noted to the left hallux measuring 2.3 x 1.8 x 0.2 cm (LxWxD).   To the noted ulceration(s), there is no eschar. There is a moderate amount of slough, fibrin, and necrotic tissue noted. Granulation tissue and wound base is red. There is a minimal amount of serosanguineous drainage noted. There is no exposed bone muscle-tendon ligament or joint. There is no malodor. Periwound integrity is intact. Skin is warm, dry and supple bilateral lower extremities.  Vascular: Palpable pedal pulses bilaterally. No edema or erythema noted. Capillary refill within normal limits.  Neurological: Epicritic and protective threshold diminished bilaterally.   Musculoskeletal Exam: Range of motion within normal limits to all pedal and ankle joints bilateral. Muscle strength 5/5 in all groups bilateral.   Assessment: #1 ulceration of the left hallux secondary to diabetes mellitus #2 diabetes mellitus w/ peripheral neuropathy   Plan of Care:  #1 Patient was  evaluated. #2 medically necessary excisional debridement including subcutaneous tissue was performed using a tissue nipper and a chisel blade. Excisional debridement of all the necrotic nonviable tissue down to healthy bleeding viable tissue was performed with post-debridement measurements same as pre-. #3 the wound was cleansed and dry sterile dressing applied. #4 Culture taken from wound.  #5 Refill prescription for Doxycycline #20 provided to patient.  #6 Referral resent to [facility] wound care center. Patient never made an appointment.  #7 Return to clinic in 10 days.    Edrick Kins, DPM [facility]  Dr. Edrick Kins, [address]                                        [place], [place] [address]                Office [phone]  Fax [phone]

## 2018-05-26 LAB — WOUND CULTURE

## 2018-06-02 ENCOUNTER — Encounter

## 2018-06-02 ENCOUNTER — Ambulatory Visit: Payer: 59 | Admitting: Podiatry

## 2018-06-02 ENCOUNTER — Encounter: Payer: Self-pay | Admitting: Podiatry

## 2018-06-02 DIAGNOSIS — I70245 Atherosclerosis of native arteries of left leg with ulceration of other part of foot: Secondary | ICD-10-CM

## 2018-06-02 DIAGNOSIS — L97522 Non-pressure chronic ulcer of other part of left foot with fat layer exposed: Secondary | ICD-10-CM

## 2018-06-02 DIAGNOSIS — E0843 Diabetes mellitus due to underlying condition with diabetic autonomic (poly)neuropathy: Secondary | ICD-10-CM

## 2018-06-03 NOTE — Progress Notes (Signed)
   Subjective:  47 year old male with PMHx of T1DM presenting today for follow up evaluation of an ulceration of the left hallux. He reports some mild improvement of the wound. He reports continued malodor. He still has not been seen by the wound care center. Patient is here for further evaluation and treatment.   Past Medical History:  Diagnosis Date  . CKD (chronic kidney disease), stage II   . Gastroparesis   . Herniated cervical disc   . HTN (hypertension)   . Hypercholesteremia   . S/P cardiac cath    a. 10-15 yrs ago at [facility] due to tachycardia, reportedly normal. b. Normal ETT 03/2012.  Marland Kitchen Sinus tachycardia    a. 24-hr Holter 03/2012 - SR, occ PVCs, no VT, avg HR 96bpm.  . Type 1 diabetes mellitus ([place])    a. With insulin pump.      Objective/Physical Exam General: The patient is alert and oriented x3 in no acute distress.  Dermatology:  Wound #1 noted to the left hallux measuring 2.4 x 1.9 x 0.2 cm (LxWxD).   To the noted ulceration(s), there is no eschar. There is a moderate amount of slough, fibrin, and necrotic tissue noted. Granulation tissue and wound base is red. There is a minimal amount of serosanguineous drainage noted. There is no exposed bone muscle-tendon ligament or joint. There is no malodor. Periwound integrity is intact. Skin is warm, dry and supple bilateral lower extremities.  Vascular: Palpable pedal pulses bilaterally. No edema or erythema noted. Capillary refill within normal limits.  Neurological: Epicritic and protective threshold diminished bilaterally.   Musculoskeletal Exam: Range of motion within normal limits to all pedal and ankle joints bilateral. Muscle strength 5/5 in all groups bilateral.   Assessment: #1 ulceration of the left hallux secondary to diabetes mellitus #2 diabetes mellitus w/ peripheral neuropathy   Plan of Care:  #1 Patient was evaluated. #2 medically necessary excisional debridement including subcutaneous tissue was performed  using a tissue nipper and a chisel blade. Excisional debridement of all the necrotic nonviable tissue down to healthy bleeding viable tissue was performed with post-debridement measurements same as pre-. #3 the wound was cleansed and dry sterile dressing applied. #4 Recommended collagen daily with dry sterile dressing.  #5 CAM boot dispensed. Discontinue using post op shoe.  #6 Return to clinic in 3 weeks.    Edrick Kins, DPM [facility]  Dr. Edrick Kins, [address]                                        [place], [place] [address]                Office [phone]  Fax [phone]

## 2018-06-09 ENCOUNTER — Ambulatory Visit: Payer: Self-pay | Admitting: Cardiovascular Disease

## 2018-06-12 DIAGNOSIS — E1065 Type 1 diabetes mellitus with hyperglycemia: Secondary | ICD-10-CM | POA: Diagnosis not present

## 2018-06-12 DIAGNOSIS — E109 Type 1 diabetes mellitus without complications: Secondary | ICD-10-CM | POA: Diagnosis not present

## 2018-06-27 ENCOUNTER — Encounter: Payer: Self-pay | Admitting: Podiatry

## 2018-06-27 ENCOUNTER — Ambulatory Visit: Payer: 59 | Admitting: Podiatry

## 2018-06-27 DIAGNOSIS — L97522 Non-pressure chronic ulcer of other part of left foot with fat layer exposed: Secondary | ICD-10-CM | POA: Diagnosis not present

## 2018-06-27 DIAGNOSIS — I70245 Atherosclerosis of native arteries of left leg with ulceration of other part of foot: Secondary | ICD-10-CM

## 2018-06-27 DIAGNOSIS — E0843 Diabetes mellitus due to underlying condition with diabetic autonomic (poly)neuropathy: Secondary | ICD-10-CM

## 2018-06-27 MED ORDER — GENTAMICIN SULFATE 0.1 % EX CREA: 1.0000 "application " | TOPICAL_CREAM | Freq: Two times a day (BID) | CUTANEOUS | 1 refills | Status: DC

## 2018-06-27 MED ORDER — DOXYCYCLINE HYCLATE 100 MG PO TABS: 100.0000 mg | ORAL_TABLET | Freq: Two times a day (BID) | ORAL | 0 refills | Status: DC

## 2018-06-27 NOTE — Progress Notes (Signed)
   Subjective:  47 year old male with PMHx of T1DM presenting today for follow up evaluation of an ulceration of the left hallux. He reports some redness and soreness of the area that began 4-5 days ago and he is concerned for infection. He has been using the CAM boot as directed without issue. There are no modifying factors noted. Patient is here for further evaluation and treatment.   Past Medical History:  Diagnosis Date  . CKD (chronic kidney disease), stage II   . Gastroparesis   . Herniated cervical disc   . HTN (hypertension)   . Hypercholesteremia   . S/P cardiac cath    a. 10-15 yrs ago at [facility] due to tachycardia, reportedly normal. b. Normal ETT 03/2012.  Marland Kitchen Sinus tachycardia    a. 24-hr Holter 03/2012 - SR, occ PVCs, no VT, avg HR 96bpm.  . Type 1 diabetes mellitus ([place])    a. With insulin pump.      Objective/Physical Exam General: The patient is alert and oriented x3 in no acute distress.  Dermatology:  Wound #1 noted to the left hallux measuring 0.8 x 1.0 x 0.2 cm (LxWxD).   To the noted ulceration(s), there is no eschar. There is a moderate amount of slough, fibrin, and necrotic tissue noted. Granulation tissue and wound base is red. There is a minimal amount of serosanguineous drainage noted. There is no exposed bone muscle-tendon ligament or joint. There is no malodor. Periwound integrity is intact. Skin is warm, dry and supple bilateral lower extremities.  Vascular: Palpable pedal pulses bilaterally. No edema or erythema noted. Capillary refill within normal limits.  Neurological: Epicritic and protective threshold diminished bilaterally.   Musculoskeletal Exam: Range of motion within normal limits to all pedal and ankle joints bilateral. Muscle strength 5/5 in all groups bilateral.   Assessment: #1 ulceration of the left hallux secondary to diabetes mellitus #2 diabetes mellitus w/ peripheral neuropathy   Plan of Care:  #1 Patient was evaluated. #2 medically  necessary excisional debridement including subcutaneous tissue was performed using a tissue nipper and a chisel blade. Excisional debridement of all the necrotic nonviable tissue down to healthy bleeding viable tissue was performed with post-debridement measurements same as pre-. #3 the wound was cleansed and dry sterile dressing applied. #4 Culture taken from wound.  #5 Prescription for Doxycycline 100 mg #20 provided to patient.  #6 Prescription for gentamicin cream provided to patient.  #7 Continue using CAM boot. Weightbearing as tolerated.  #8 Return to clinic in 3 weeks.     Edrick Kins, DPM [facility]  Dr. Edrick Kins, [address]                                        [place], [place] [address]                Office [phone]  Fax [phone]

## 2018-07-02 LAB — WOUND CULTURE: Organism ID, Bacteria: NONE SEEN

## 2018-07-11 ENCOUNTER — Encounter: Payer: Self-pay | Admitting: Podiatry

## 2018-07-11 ENCOUNTER — Ambulatory Visit (INDEPENDENT_AMBULATORY_CARE_PROVIDER_SITE_OTHER): Payer: 59 | Admitting: Podiatry

## 2018-07-11 DIAGNOSIS — I70245 Atherosclerosis of native arteries of left leg with ulceration of other part of foot: Secondary | ICD-10-CM | POA: Diagnosis not present

## 2018-07-11 DIAGNOSIS — L97522 Non-pressure chronic ulcer of other part of left foot with fat layer exposed: Secondary | ICD-10-CM

## 2018-07-11 DIAGNOSIS — E0843 Diabetes mellitus due to underlying condition with diabetic autonomic (poly)neuropathy: Secondary | ICD-10-CM

## 2018-07-12 NOTE — Progress Notes (Signed)
   Subjective:  47 year old male with PMHx of T1DM presenting today for follow up evaluation of an ulceration of the left hallux. He states the wound is improving. He is concerned because he notes a callus forming over the area. There are no modifying factors noted. He has been using collagen and a bandage as well as using the CAM boot as directed. Patient is here for further evaluation and treatment.   Past Medical History:  Diagnosis Date  . CKD (chronic kidney disease), stage II   . Gastroparesis   . Herniated cervical disc   . HTN (hypertension)   . Hypercholesteremia   . S/P cardiac cath    a. 10-15 yrs ago at [facility] due to tachycardia, reportedly normal. b. Normal ETT 03/2012.  Marland Kitchen Sinus tachycardia    a. 24-hr Holter 03/2012 - SR, occ PVCs, no VT, avg HR 96bpm.  . Type 1 diabetes mellitus ([place])    a. With insulin pump.      Objective/Physical Exam General: The patient is alert and oriented x3 in no acute distress.  Dermatology:  Wound #1 noted to the left hallux measuring 0.6 x 0.6 x 0.2 cm (LxWxD).   To the noted ulceration(s), there is no eschar. There is a moderate amount of slough, fibrin, and necrotic tissue noted. Granulation tissue and wound base is red. There is a minimal amount of serosanguineous drainage noted. There is no exposed bone muscle-tendon ligament or joint. There is no malodor. Periwound integrity is intact. Skin is warm, dry and supple bilateral lower extremities.  Vascular: Palpable pedal pulses bilaterally. No edema or erythema noted. Capillary refill within normal limits.  Neurological: Epicritic and protective threshold diminished bilaterally.   Musculoskeletal Exam: Range of motion within normal limits to all pedal and ankle joints bilateral. Muscle strength 5/5 in all groups bilateral.       Assessment: #1 ulceration of the left hallux secondary to diabetes mellitus #2 diabetes mellitus w/ peripheral neuropathy   Plan of Care:  #1 Patient was  evaluated. #2 medically necessary excisional debridement including subcutaneous tissue was performed using a tissue nipper and a chisel blade. Excisional debridement of all the necrotic nonviable tissue down to healthy bleeding viable tissue was performed with post-debridement measurements same as pre-. #3 the wound was cleansed and dry sterile dressing applied. #4 Continue using collagen and dry sterile dressing daily.  #5 Continue weightbearing in CAM boot.  #6 Silver wound gel provided to patient if he feels like there is no improvement with collagen.  #7 Return to clinic in 2 weeks.      Edrick Kins, DPM [facility]  Dr. Edrick Kins, [address]                                        [place], [place] [address]                Office [phone]  Fax [phone]

## 2018-07-21 ENCOUNTER — Ambulatory Visit: Payer: 59 | Admitting: Podiatry

## 2018-07-27 ENCOUNTER — Encounter: Payer: Self-pay | Admitting: Podiatry

## 2018-07-27 ENCOUNTER — Ambulatory Visit: Payer: 59 | Admitting: Podiatry

## 2018-07-27 DIAGNOSIS — E0843 Diabetes mellitus due to underlying condition with diabetic autonomic (poly)neuropathy: Secondary | ICD-10-CM

## 2018-07-27 DIAGNOSIS — L97522 Non-pressure chronic ulcer of other part of left foot with fat layer exposed: Secondary | ICD-10-CM | POA: Diagnosis not present

## 2018-07-27 NOTE — Progress Notes (Signed)
This patient presents to the office saying the ulcer on his left big toe has opened up and has worsened.  He states that about 3 days ago he removed his bandage and noticed that there was increased  dead tissue noted on the inside aspect of left great toe.  He has previously been seen by Dr. Amalia Hailey who was been treating him with medicine to close the ulcer on his left great toe.  He was also dispensed a cam walker to ambulate with by Dr. Amalia Hailey.  He presents the office today for continued evaluation and treatment of this enlarged diabetic ulcer.    Vascular  Dorsalis pedis and posterior tibial pulses are palpable  B/L.  Capillary return  WNL.  Temperature gradient is  WNL.  Skin turgor  WNL  Sensorium  Senn Weinstein monofilament wire  diminished. Diminished  tactile sensation.  Nail Exam  Patient has normal nails with no evidence of bacterial or fungal infection.  Orthopedic  Exam  Muscle tone and muscle strength  WNL.  No limitations of motion feet  B/L.  No crepitus or joint effusion noted.  Foot type is unremarkable and digits show no abnormalities.  Bony prominences are unremarkable.  Skin    Normal skin texture and turgor. Ulcer noted under the medial aspect of the left hallux.  There is white necrotic tissue noted extending from the ulcer medially to the line of breaking left hallux.  No evidence of any redness, swelling or infection.  Diabetic ulcer left hallux.  ROV.  Measurement of the ulcerated area left hallux. After debridement of necrotic tissue reveals a 2 cm x 1 cm ulceration.  This ulcer was bandaged with Neosporin and dry sterile dressing and Coban.  Patient may have had an allergic reaction to the bandages and the tape on the bandage that may have led to the white necrotic tissue formation left hallux. Patient was given home soaking instructions.  Patient was also dispensed a new cam walker to ambulate.  RTC 1 week.   Gardiner Barefoot DPM

## 2018-08-01 ENCOUNTER — Ambulatory Visit: Payer: 59 | Admitting: Podiatry

## 2018-08-01 ENCOUNTER — Encounter: Payer: Self-pay | Admitting: Podiatry

## 2018-08-01 DIAGNOSIS — L97522 Non-pressure chronic ulcer of other part of left foot with fat layer exposed: Secondary | ICD-10-CM | POA: Diagnosis not present

## 2018-08-01 DIAGNOSIS — E0843 Diabetes mellitus due to underlying condition with diabetic autonomic (poly)neuropathy: Secondary | ICD-10-CM | POA: Diagnosis not present

## 2018-08-01 DIAGNOSIS — I70245 Atherosclerosis of native arteries of left leg with ulceration of other part of foot: Secondary | ICD-10-CM | POA: Diagnosis not present

## 2018-08-01 MED ORDER — DOXYCYCLINE HYCLATE 100 MG PO TABS: 100.0000 mg | ORAL_TABLET | Freq: Two times a day (BID) | ORAL | 0 refills | Status: DC

## 2018-08-02 NOTE — Progress Notes (Signed)
   Subjective:  47 year old male with PMHx of T1DM presenting today for follow up evaluation of an ulceration of the left hallux.  Patient states that the wound improved significantly, however the last 10 days there is been deterioration of the wound with increased redness to the toe.  He has been weightbearing in the immobilization cam boot on light duty at work.  Patient is also been applying silver antimicrobial wound gel daily.  Past Medical History:  Diagnosis Date  . CKD (chronic kidney disease), stage II   . Gastroparesis   . Herniated cervical disc   . HTN (hypertension)   . Hypercholesteremia   . S/P cardiac cath    a. 10-15 yrs ago at [facility] due to tachycardia, reportedly normal. b. Normal ETT 03/2012.  Marland Kitchen Sinus tachycardia    a. 24-hr Holter 03/2012 - SR, occ PVCs, no VT, avg HR 96bpm.  . Type 1 diabetes mellitus ([place])    a. With insulin pump.      Objective/Physical Exam General: The patient is alert and oriented x3 in no acute distress.  Dermatology:  Wound #1 noted to the left hallux measuring 0.7 x 0.5 x 0.2 cm (LxWxD).   To the noted ulceration(s), there is no eschar. There is a moderate amount of slough, fibrin, and necrotic tissue noted. Granulation tissue and wound base is red. There is a minimal amount of serosanguineous drainage noted. There is no exposed bone muscle-tendon ligament or joint. There is no malodor. Periwound integrity is intact. Skin is warm, dry and supple bilateral lower extremities.  Vascular: Palpable pedal pulses bilaterally.  There is some moderate erythema and edema noted to the affected toe concerning for a toe cellulitis.  Capillary refill within normal limits.  Neurological: Epicritic and protective threshold diminished bilaterally.   Musculoskeletal Exam: Range of motion within normal limits to all pedal and ankle joints bilateral. Muscle strength 5/5 in all groups bilateral.   Assessment: #1 ulceration of the left hallux secondary to  diabetes mellitus #2 diabetes mellitus w/ peripheral neuropathy #3 cellulitis left hallux   Plan of Care:  #1 Patient was evaluated. #2 medically necessary excisional debridement including subcutaneous tissue was performed using a tissue nipper and a chisel blade. Excisional debridement of all the necrotic nonviable tissue down to healthy bleeding viable tissue was performed with post-debridement measurements same as pre-. #3 the wound was cleansed and dry sterile dressing applied. #4  Continue silver antimicrobial wound gel #5 continue weightbearing in the immobilization cam boot light duty at work #6 refill prescription for doxycycline 100 mg #20 #7 today we are going to request authorization for Theraskin skin graft.  The patient has failed conservative treatment for greater than 6 weeks.  Treatments have failed to create any sort of satisfactory alleviation of symptoms for the patient. #8 return to clinic in 2 weeks   Edrick Kins, DPM [facility]  Dr. Edrick Kins, [address]                                        [place], [place] [address]                Office [phone]  Fax [phone]

## 2018-08-07 DIAGNOSIS — I10 Essential (primary) hypertension: Secondary | ICD-10-CM | POA: Diagnosis not present

## 2018-08-07 DIAGNOSIS — E1040 Type 1 diabetes mellitus with diabetic neuropathy, unspecified: Secondary | ICD-10-CM | POA: Diagnosis not present

## 2018-08-07 DIAGNOSIS — N183 Chronic kidney disease, stage 3 (moderate): Secondary | ICD-10-CM | POA: Diagnosis not present

## 2018-08-07 DIAGNOSIS — E1065 Type 1 diabetes mellitus with hyperglycemia: Secondary | ICD-10-CM | POA: Diagnosis not present

## 2018-08-21 DIAGNOSIS — I10 Essential (primary) hypertension: Secondary | ICD-10-CM | POA: Diagnosis not present

## 2018-08-21 DIAGNOSIS — N183 Chronic kidney disease, stage 3 (moderate): Secondary | ICD-10-CM | POA: Diagnosis not present

## 2018-08-21 DIAGNOSIS — R809 Proteinuria, unspecified: Secondary | ICD-10-CM | POA: Diagnosis not present

## 2018-08-21 DIAGNOSIS — E1022 Type 1 diabetes mellitus with diabetic chronic kidney disease: Secondary | ICD-10-CM | POA: Diagnosis not present

## 2018-08-25 ENCOUNTER — Ambulatory Visit: Payer: 59 | Admitting: Podiatry

## 2018-08-25 ENCOUNTER — Encounter: Payer: Self-pay | Admitting: Podiatry

## 2018-08-25 DIAGNOSIS — L97522 Non-pressure chronic ulcer of other part of left foot with fat layer exposed: Secondary | ICD-10-CM | POA: Diagnosis not present

## 2018-08-25 DIAGNOSIS — I70245 Atherosclerosis of native arteries of left leg with ulceration of other part of foot: Secondary | ICD-10-CM

## 2018-08-25 DIAGNOSIS — E0843 Diabetes mellitus due to underlying condition with diabetic autonomic (poly)neuropathy: Secondary | ICD-10-CM

## 2018-08-27 NOTE — Progress Notes (Signed)
Subjective:  47 year old male with PMHx of T1DM presenting today for follow up evaluation of an ulceration of the left hallux.  Patient has been very frustrated with the nonhealing nature of the ulceration site.  Conservative treatments including routine serial debridement, collagen and multiple wound products, offloading and weight distribution using immobilization cam boots and postoperative shoes all have been unsuccessful to provide any significant improvement of the ulceration sites of the hallux.  He presents for further treatment evaluation  Past Medical History:  Diagnosis Date  . CKD (chronic kidney disease), stage II   . Gastroparesis   . Herniated cervical disc   . HTN (hypertension)   . Hypercholesteremia   . S/P cardiac cath    a. 10-15 yrs ago at [facility] due to tachycardia, reportedly normal. b. Normal ETT 03/2012.  Marland Kitchen Sinus tachycardia    a. 24-hr Holter 03/2012 - SR, occ PVCs, no VT, avg HR 96bpm.  . Type 1 diabetes mellitus ([place])    a. With insulin pump.      Objective/Physical Exam General: The patient is alert and oriented x3 in no acute distress.  Dermatology:  Wound #1 noted to the left hallux measuring 1.51.5 x 0.3 cm (LxWxD).   To the noted ulceration(s), there is no eschar. There is a moderate amount of slough, fibrin, and necrotic tissue noted. Granulation tissue and wound base is red. There is a minimal amount of serosanguineous drainage noted. There is no exposed bone muscle-tendon ligament or joint. There is no malodor. Periwound integrity is intact. Skin is warm, dry and supple bilateral lower extremities.  Vascular: Palpable pedal pulses bilaterally.  There is some moderate erythema and edema noted to the affected toe concerning for a toe cellulitis.  Capillary refill within normal limits.  Neurological: Epicritic and protective threshold diminished bilaterally.   Musculoskeletal Exam: Range of motion within normal limits to all pedal and ankle joints  bilateral. Muscle strength 5/5 in all groups bilateral.   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 #2 medically necessary excisional debridement including subcutaneous tissue was performed using a tissue nipper and a chisel blade. Excisional debridement of all the necrotic nonviable tissue down to healthy bleeding viable tissue was performed with post-debridement measurements same as pre-. #3 the wound was cleansed and dry sterile dressing applied. #4  New wound cultures were taken today and sent to pathology for culture and sensitivity #5 continue gentamicin cream and dry sterile dressing #6 today we are going to make an order for home health dressing supplies.  Patient can change the dressings by himself at home.  Supplies required would be aquacal Ag, Prisma collagen matrix, and dry sterile dressing supplies. #7 patient would also benefit from Peg Assist offloading insole to place inside of his postoperative shoe.  I will work on getting this provided to the patient #8 at the moment the patient's insurance is denied skin grafting.  This would be medically necessary and beneficial to the patient due to the fact that he is failed conservative treatment for several months, greater than 8 weeks, despite aggressive conservative treatment.  We will attempt to get authorization through peer to peer review #9 return to clinic in 3 weeks  Edrick Kins, DPM [facility]  Dr. Edrick Kins, DPM    [address]  [place], [place] [address]                Office [phone]  Fax [phone]

## 2018-08-28 ENCOUNTER — Observation Stay
Admission: EM | Admit: 2018-08-28 | Discharge: 2018-08-29 | Disposition: A | Discharge status: Home / Self Care | Payer: 59 | Attending: Family Medicine | Admitting: Family Medicine

## 2018-08-28 ENCOUNTER — Emergency Department: Payer: 59

## 2018-08-28 ENCOUNTER — Other Ambulatory Visit: Payer: Self-pay

## 2018-08-28 DIAGNOSIS — E119 Type 2 diabetes mellitus without complications: Secondary | ICD-10-CM | POA: Diagnosis not present

## 2018-08-28 DIAGNOSIS — I129 Hypertensive chronic kidney disease with stage 1 through stage 4 chronic kidney disease, or unspecified chronic kidney disease: Secondary | ICD-10-CM | POA: Insufficient documentation

## 2018-08-28 DIAGNOSIS — N183 Chronic kidney disease, stage 3 (moderate): Secondary | ICD-10-CM | POA: Diagnosis not present

## 2018-08-28 DIAGNOSIS — Z880 Allergy status to penicillin: Secondary | ICD-10-CM | POA: Insufficient documentation

## 2018-08-28 DIAGNOSIS — Z9119 Patient's noncompliance with other medical treatment and regimen: Secondary | ICD-10-CM | POA: Insufficient documentation

## 2018-08-28 DIAGNOSIS — Z7982 Long term (current) use of aspirin: Secondary | ICD-10-CM | POA: Insufficient documentation

## 2018-08-28 DIAGNOSIS — Z885 Allergy status to narcotic agent status: Secondary | ICD-10-CM | POA: Diagnosis not present

## 2018-08-28 DIAGNOSIS — K3184 Gastroparesis: Secondary | ICD-10-CM | POA: Insufficient documentation

## 2018-08-28 DIAGNOSIS — E1043 Type 1 diabetes mellitus with diabetic autonomic (poly)neuropathy: Secondary | ICD-10-CM | POA: Insufficient documentation

## 2018-08-28 DIAGNOSIS — Z79899 Other long term (current) drug therapy: Secondary | ICD-10-CM | POA: Insufficient documentation

## 2018-08-28 DIAGNOSIS — Z9641 Presence of insulin pump (external) (internal): Secondary | ICD-10-CM | POA: Diagnosis not present

## 2018-08-28 DIAGNOSIS — G459 Transient cerebral ischemic attack, unspecified: Principal | ICD-10-CM | POA: Insufficient documentation

## 2018-08-28 DIAGNOSIS — N179 Acute kidney failure, unspecified: Secondary | ICD-10-CM | POA: Insufficient documentation

## 2018-08-28 DIAGNOSIS — R4701 Aphasia: Secondary | ICD-10-CM | POA: Insufficient documentation

## 2018-08-28 DIAGNOSIS — Z888 Allergy status to other drugs, medicaments and biological substances status: Secondary | ICD-10-CM | POA: Diagnosis not present

## 2018-08-28 DIAGNOSIS — E1040 Type 1 diabetes mellitus with diabetic neuropathy, unspecified: Secondary | ICD-10-CM | POA: Diagnosis not present

## 2018-08-28 DIAGNOSIS — E1022 Type 1 diabetes mellitus with diabetic chronic kidney disease: Secondary | ICD-10-CM | POA: Diagnosis not present

## 2018-08-28 DIAGNOSIS — H538 Other visual disturbances: Secondary | ICD-10-CM | POA: Diagnosis not present

## 2018-08-28 DIAGNOSIS — E785 Hyperlipidemia, unspecified: Secondary | ICD-10-CM | POA: Diagnosis not present

## 2018-08-28 DIAGNOSIS — E7800 Pure hypercholesterolemia, unspecified: Secondary | ICD-10-CM | POA: Diagnosis not present

## 2018-08-28 DIAGNOSIS — I10 Essential (primary) hypertension: Secondary | ICD-10-CM | POA: Diagnosis not present

## 2018-08-28 DIAGNOSIS — G43109 Migraine with aura, not intractable, without status migrainosus: Secondary | ICD-10-CM

## 2018-08-28 LAB — DIFFERENTIAL
Abs Immature Granulocytes: 0.07 10*3/uL (ref 0.00–0.07)
Basophils Absolute: 0.1 10*3/uL (ref 0.0–0.1)
Basophils Relative: 1 %
Eosinophils Absolute: 0.4 10*3/uL (ref 0.0–0.5)
Eosinophils Relative: 5 %
Immature Granulocytes: 1 %
Lymphocytes Relative: 29 %
Lymphs Abs: 2.5 10*3/uL (ref 0.7–4.0)
Monocytes Absolute: 0.7 10*3/uL (ref 0.1–1.0)
Monocytes Relative: 8 %
Neutro Abs: 5 10*3/uL (ref 1.7–7.7)
Neutrophils Relative %: 56 %

## 2018-08-28 LAB — CBC
HCT: 42.5 % (ref 39.0–52.0)
HCT: 41.2 % (ref 39.0–52.0)
Hemoglobin: 14.3 g/dL (ref 13.0–17.0)
Hemoglobin: 13.6 g/dL (ref 13.0–17.0)
MCH: 28.9 pg (ref 26.0–34.0)
MCH: 28.6 pg (ref 26.0–34.0)
MCHC: 33.6 g/dL (ref 30.0–36.0)
MCHC: 33 g/dL (ref 30.0–36.0)
MCV: 85.9 fL (ref 80.0–100.0)
MCV: 86.6 fL (ref 80.0–100.0)
Platelets: 301 10*3/uL (ref 150–400)
Platelets: 282 10*3/uL (ref 150–400)
RBC: 4.95 MIL/uL (ref 4.22–5.81)
RBC: 4.76 MIL/uL (ref 4.22–5.81)
RDW: 13.1 % (ref 11.5–15.5)
RDW: 13.1 % (ref 11.5–15.5)
WBC: 8.8 10*3/uL (ref 4.0–10.5)
WBC: 7.5 10*3/uL (ref 4.0–10.5)
nRBC: 0 % (ref 0.0–0.2)
nRBC: 0 % (ref 0.0–0.2)

## 2018-08-28 LAB — WOUND CULTURE: Organism ID, Bacteria: NONE SEEN

## 2018-08-28 LAB — COMPREHENSIVE METABOLIC PANEL
ALT: 19 U/L (ref 0–44)
AST: 26 U/L (ref 15–41)
Albumin: 2.9 g/dL — ABNORMAL LOW (ref 3.5–5.0)
Alkaline Phosphatase: 84 U/L (ref 38–126)
Anion gap: 9 (ref 5–15)
BUN: 24 mg/dL — ABNORMAL HIGH (ref 6–20)
CO2: 25 mmol/L (ref 22–32)
Calcium: 8.6 mg/dL — ABNORMAL LOW (ref 8.9–10.3)
Chloride: 101 mmol/L (ref 98–111)
Creatinine, Ser: 1.94 mg/dL — ABNORMAL HIGH (ref 0.61–1.24)
GFR calc Af Amer: 46 mL/min — ABNORMAL LOW (ref >60)
GFR calc non Af Amer: 40 mL/min — ABNORMAL LOW (ref >60)
Glucose, Bld: 144 mg/dL — ABNORMAL HIGH (ref 70–99)
Potassium: 4 mmol/L (ref 3.5–5.1)
Sodium: 135 mmol/L (ref 135–145)
Total Bilirubin: 0.6 mg/dL (ref 0.3–1.2)
Total Protein: 6.5 g/dL (ref 6.5–8.1)

## 2018-08-28 LAB — GLUCOSE, CAPILLARY
Glucose-Capillary: 124 mg/dL — ABNORMAL HIGH (ref 70–99)
Glucose-Capillary: 135 mg/dL — ABNORMAL HIGH (ref 70–99)

## 2018-08-28 LAB — HEMOGLOBIN A1C
Hgb A1c MFr Bld: 10.9 % — ABNORMAL HIGH (ref 4.8–5.6)
Mean Plasma Glucose: 266.13 mg/dL

## 2018-08-28 LAB — PROTIME-INR
INR: 0.9
Prothrombin Time: 12.1 seconds (ref 11.4–15.2)

## 2018-08-28 LAB — LIPID PANEL
Cholesterol: 276 mg/dL — ABNORMAL HIGH (ref 0–200)
HDL: 43 mg/dL (ref >40)
LDL Cholesterol: 197 mg/dL — ABNORMAL HIGH (ref 0–99)
Total CHOL/HDL Ratio: 6.4 RATIO
Triglycerides: 180 mg/dL — ABNORMAL HIGH (ref <150)
VLDL: 36 mg/dL (ref 0–40)

## 2018-08-28 LAB — TROPONIN I: Troponin I: <0.03 ng/mL (ref <0.03)

## 2018-08-28 LAB — APTT: aPTT: 30 seconds (ref 24–36)

## 2018-08-28 MED ORDER — INSULIN ASPART 100 UNIT/ML ~~LOC~~ SOLN: 0.0000 [IU] | Freq: Every day | SUBCUTANEOUS | Status: DC

## 2018-08-28 MED ORDER — INSULIN PUMP
Freq: Three times a day (TID) | SUBCUTANEOUS | Status: DC
  Administered 2018-08-28 – 2018-08-29 (×2): via SUBCUTANEOUS
  Filled 2018-08-28: qty 1

## 2018-08-28 MED ORDER — LOSARTAN POTASSIUM 50 MG PO TABS
100.0000 mg | ORAL_TABLET | Freq: Every day | ORAL | Status: DC
  Administered 2018-08-29: 100 mg via ORAL
  Filled 2018-08-28: qty 2

## 2018-08-28 MED ORDER — SENNOSIDES-DOCUSATE SODIUM 8.6-50 MG PO TABS: 1.0000 | ORAL_TABLET | Freq: Every evening | ORAL | Status: DC | PRN

## 2018-08-28 MED ORDER — KETOROLAC TROMETHAMINE 15 MG/ML IJ SOLN
15.0000 mg | Freq: Four times a day (QID) | INTRAMUSCULAR | Status: DC | PRN
  Filled 2018-08-28: qty 1

## 2018-08-28 MED ORDER — CARVEDILOL 3.125 MG PO TABS
3.1250 mg | ORAL_TABLET | Freq: Two times a day (BID) | ORAL | Status: DC
  Administered 2018-08-28 – 2018-08-29 (×2): 3.125 mg via ORAL
  Filled 2018-08-28 (×2): qty 1

## 2018-08-28 MED ORDER — ACETAMINOPHEN 160 MG/5ML PO SOLN
650.0000 mg | ORAL | Status: DC | PRN
  Filled 2018-08-28: qty 20.3

## 2018-08-28 MED ORDER — INSULIN ASPART 100 UNIT/ML ~~LOC~~ SOLN: 0.0000 [IU] | Freq: Three times a day (TID) | SUBCUTANEOUS | Status: DC

## 2018-08-28 MED ORDER — ACETAMINOPHEN 650 MG RE SUPP: 650.0000 mg | RECTAL | Status: DC | PRN

## 2018-08-28 MED ORDER — ACETAMINOPHEN 325 MG PO TABS: 650.0000 mg | ORAL_TABLET | ORAL | Status: DC | PRN

## 2018-08-28 MED ORDER — ONDANSETRON HCL 4 MG/2ML IJ SOLN
4.0000 mg | Freq: Once | INTRAMUSCULAR | Status: AC
  Administered 2018-08-28: 4 mg via INTRAVENOUS
  Filled 2018-08-28: qty 2

## 2018-08-28 MED ORDER — HYDROMORPHONE HCL 1 MG/ML IJ SOLN
0.5000 mg | Freq: Once | INTRAMUSCULAR | Status: AC
  Administered 2018-08-28: 0.5 mg via INTRAVENOUS
  Filled 2018-08-28: qty 1

## 2018-08-28 MED ORDER — METOCLOPRAMIDE HCL 5 MG/ML IJ SOLN
10.0000 mg | Freq: Once | INTRAMUSCULAR | Status: AC
  Administered 2018-08-28: 10 mg via INTRAVENOUS

## 2018-08-28 MED ORDER — ASPIRIN 325 MG PO TABS
325.0000 mg | ORAL_TABLET | Freq: Every day | ORAL | Status: DC
  Administered 2018-08-28 – 2018-08-29 (×2): 325 mg via ORAL
  Filled 2018-08-28 (×3): qty 1

## 2018-08-28 MED ORDER — METOCLOPRAMIDE HCL 5 MG/ML IJ SOLN
INTRAMUSCULAR | Status: AC
  Filled 2018-08-28: qty 2

## 2018-08-28 MED ORDER — STROKE: EARLY STAGES OF RECOVERY BOOK
Freq: Once | Status: AC
  Administered 2018-08-28: 21:00:00

## 2018-08-28 MED ORDER — FUROSEMIDE 40 MG PO TABS
40.0000 mg | ORAL_TABLET | Freq: Every day | ORAL | Status: DC
  Administered 2018-08-29: 40 mg via ORAL
  Filled 2018-08-28: qty 1

## 2018-08-28 MED ORDER — HYDROMORPHONE HCL 1 MG/ML IJ SOLN
1.0000 mg | INTRAMUSCULAR | Status: DC | PRN
  Administered 2018-08-29: 1 mg via INTRAVENOUS
  Filled 2018-08-28: qty 1

## 2018-08-28 MED ORDER — ENOXAPARIN SODIUM 40 MG/0.4ML ~~LOC~~ SOLN
40.0000 mg | SUBCUTANEOUS | Status: DC
  Administered 2018-08-28: 40 mg via SUBCUTANEOUS
  Filled 2018-08-28: qty 0.4

## 2018-08-28 MED ORDER — HYDRALAZINE HCL 20 MG/ML IJ SOLN: 10.0000 mg | Freq: Four times a day (QID) | INTRAMUSCULAR | Status: DC | PRN

## 2018-08-28 MED ORDER — ASPIRIN 300 MG RE SUPP: 300.0000 mg | Freq: Every day | RECTAL | Status: DC

## 2018-08-28 MED ORDER — ATORVASTATIN CALCIUM 20 MG PO TABS
80.0000 mg | ORAL_TABLET | Freq: Every day | ORAL | Status: DC
  Administered 2018-08-29: 10:00:00 80 mg via ORAL
  Filled 2018-08-28: qty 4

## 2018-08-28 MED ORDER — PANTOPRAZOLE SODIUM 40 MG PO TBEC
40.0000 mg | DELAYED_RELEASE_TABLET | Freq: Every day | ORAL | Status: DC
  Administered 2018-08-29: 40 mg via ORAL
  Filled 2018-08-28: qty 1

## 2018-08-28 MED ORDER — PAPAVERINE-PHENTOLAMINE 30-1 MG/ML IC SOLN: 0.3000 mL | INTRACAVERNOUS | Status: DC

## 2018-08-28 MED ORDER — MOMETASONE FURO-FORMOTEROL FUM 200-5 MCG/ACT IN AERO
2.0000 | INHALATION_SPRAY | Freq: Two times a day (BID) | RESPIRATORY_TRACT | Status: DC
  Administered 2018-08-29: 10:00:00 2 via RESPIRATORY_TRACT
  Filled 2018-08-28 (×2): qty 8.8

## 2018-08-28 MED ORDER — MORPHINE SULFATE (PF) 4 MG/ML IV SOLN
4.0000 mg | Freq: Once | INTRAVENOUS | Status: AC
  Administered 2018-08-28: 4 mg via INTRAVENOUS
  Filled 2018-08-28: qty 1

## 2018-08-28 NOTE — ED Notes (Signed)
RN Sherrie called and informed on pt's status and plan

## 2018-08-28 NOTE — ED Triage Notes (Signed)
First Nurse note:  Arrives from PCP office for evaluation of htn, confusion, and possible TIA.  Patient arrives AAOx3.  Skin warm and dry.  MAE equally and strong.  Denies all complaint, with exception to slight headache.

## 2018-08-28 NOTE — ED Notes (Signed)
Family at bedside. 

## 2018-08-28 NOTE — ED Notes (Signed)
Pt reports improved pain but still severe.

## 2018-08-28 NOTE — Progress Notes (Signed)
Chaplain responded to a code stroke. Pt came from CT. Chaplain held space and let Pt know he was there for spiritual support and would be praying for him and the care team. Chaplain aid nurse in trying to locate family (not found).    08/28/18 1600  Clinical Encounter Type  Visited With Patient  Visit Type Initial;Spiritual support;Code  Spiritual Encounters  Spiritual Needs Prayer;Emotional

## 2018-08-28 NOTE — Consult Note (Signed)
[facility] TeleNeurology Consult Services   Date of Service:   08/28/2018 15:46:29  Impression:     .  RO Acute Ischemic Stroke  Comments: the patient has multiple vascular risk factors with a relatively severe headache which is similar to a headache he had 1 month ago but at that time did not have any focal neurologic symptoms. Suspect this is more likely to be migrainous in nature, however cannot exclude a vascular event completely. Recommend treating headache with non-vasoactive headache medications. Recommend starting aspirin therapy and admitting for stroke workup. The patient has chronic kidney disease and his creatinine is not yet back. If his creatinine is not markedly elevated consider CT angios head and neck given the headache and focal neurologic symptoms. Without any focal deficit if his kidney function is markedly impaired and would cause harm to give him contrast with air on the side of caution and get an MRI and she'll and carotid ultrasound when able. He will also need additional neuroimaging per the discretion of inpatient neurology. Feel that dissection or vasospasm less likely but not impossible  Metrics: Last Known Well: 08/28/2018 14:30:00 [facility] Notification Time: 08/28/2018 15:45:42 Arrival Time: 08/28/2018 15:09:00 Stamp Time: 08/28/2018 15:46:29 Time First Login Attempt: 08/28/2018 15:53:25 Video Start Time: 08/28/2018 15:53:25  Symptoms: HA/vision changes/speech issues. NIHSS Start Assessment Time: 08/28/2018 15:55:30 Patient is not a candidate for tPA. Patient was not deemed candidate for tPA thrombolytics because of the patient's focal symptoms have resolved at this point. Video End Time: 08/28/2018 16:05:55  CT head showed no acute hemorrhage or acute core infarct.  Advanced imaging was not obtained as the presentation was not suggestive of Large Vessel Occlusive Disease.   ER Physician notified of the decision on thrombolytics management on  08/28/2018 16:05:46  Our recommendations are outlined below.  Recommendations:     .  Activate Stroke Protocol Admission/Order Set     .  Stroke/Telemetry Floor     .  Neuro Checks     .  Bedside Swallow Eval     .  DVT Prophylaxis     .  IV Fluids, Normal Saline     .  Head of Bed Below 30 Degrees     .  Euglycemia and Avoid Hyperthermia (PRN Acetaminophen)     .  Initiate Aspirin 325 MG Daily   Sign Out:     .  Discussed with Emergency Department Provider    ------------------------------------------------------------------------------  History of Present Illness: Patient is a 47 year old Male.  Patient was brought by EMS for symptoms of HA/vision changes/speech issues.  At 14:30 he began to have a bad HA with bilateral arm pain. [place] DM, HLD, HTN and CKD. He had a HA like this one month ago he did not see treatment. at the time. THe speech happened 10 min prior to the onset of the HA. Perhaps a flash i his left vision. He is sensitive to light and sound. HA 10/10. One month ago the HA was the same but no focal neuro symptoms at that time.  CT head showed no acute hemorrhage or acute core infarct.   Examination: HD(622 systolic), Blood [id number](119) 1A: Level of Consciousness - Alert; keenly responsive + 0 1B: Ask Month and Age - Both Questions Right + 0 1C: Blink Eyes & Squeeze Hands - Performs Both Tasks + 0 2: Test Horizontal Extraocular Movements - Normal + 0 3: Test Visual Fields - No Visual Loss + 0 4: Test Facial Palsy (Use Grimace if  Obtunded) - Normal symmetry + 0 5A: Test Left Arm Motor Drift - No Drift for 10 Seconds + 0 5B: Test Right Arm Motor Drift - No Drift for 10 Seconds + 0 6A: Test Left Leg Motor Drift - No Drift for 5 Seconds + 0 6B: Test Right Leg Motor Drift - No Drift for 5 Seconds + 0 7: Test Limb Ataxia (FNF/Heel-Shin) - No Ataxia + 0 8: Test Sensation - Normal; No sensory loss + 0 9: Test Language/Aphasia - Normal; No aphasia + 0 10: Test  Dysarthria - Normal + 0 11: Test Extinction/Inattention - No abnormality + 0  NIHSS Score: 0  Patient was informed the Neurology Consult would happen via TeleHealth consult by way of interactive audio and video telecommunications and consented to receiving care in this manner.  Due to the immediate potential for life-threatening deterioration due to underlying acute neurologic illness, I spent 35 minutes providing critical care. This time includes time for face to face visit via telemedicine, review of medical records, imaging studies and discussion of findings with providers, the patient and/or family.   Dr Katina Degree   [facility] [phone]   Case [id number]

## 2018-08-28 NOTE — ED Notes (Signed)
Pt to the er for sudden onset headache and loss of words. Pt had an episode a month ago of a severe headache but no loss of speech. Pt has a hx of diabetes and HTN. Pt wears an insulin pump. Pt was taking amlodipine but was taken off due to edema. Pt now taking coreg for the last 5 days. Pt also taking antibiotics for a foot ulcer r/t the swelling he experienced with the amlodipine. No neuro deficits at this time. Pt does have photophobia but no nausea.

## 2018-08-28 NOTE — ED Notes (Signed)
ED TO INPATIENT HANDOFF REPORT  Name/Age/Gender [name] 47 y.o. male  Code Status Code Status History    Date Active Date Inactive Code Status Order ID Comments User Context   09/07/2015 2222 09/09/2015 1854 Full Code [id number]  Ivor Costa, MD ED   12/18/2013 1428 12/18/2013 2139 Full Code [id number]  Martinique, [name] M, MD Inpatient   12/17/2013 1944 12/18/2013 1428 Full Code [id number]  Charlie Pitter, PA-C Inpatient   10/19/2012 1427 10/21/2012 1214 Full Code [id number]  Erma Heritage, RN Inpatient      Home/SNF/Other Home  Chief Complaint htn, confusion  Level of Care/Admitting Diagnosis ED Disposition    ED Disposition Condition [place]: [place] [[id number]]  Level of Care: Med-Surg [16]  Diagnosis: TIA (transient ischemic attack) [382505]  Admitting Physician: Bettey Costa [[id number]]  Attending Physician: MODY, Ulice Bold [[id number]]  PT Class (Do Not Modify): Observation [104]  PT Acc Code (Do Not Modify): Observation [10022]       Medical History Past Medical History:  Diagnosis Date  . CKD (chronic kidney disease), stage II   . Gastroparesis   . Herniated cervical disc   . HTN (hypertension)   . Hypercholesteremia   . S/P cardiac cath    a. 10-15 yrs ago at [facility] due to tachycardia, reportedly normal. b. Normal ETT 03/2012.  Marland Kitchen Sinus tachycardia    a. 24-hr Holter 03/2012 - SR, occ PVCs, no VT, avg HR 96bpm.  . Type 1 diabetes mellitus ([place])    a. With insulin pump.    Allergies Allergies  Allergen Reactions  . Oxycontin [Oxycodone Hcl] Nausea And Vomiting  . Penicillins Other (See Comments)    Possible reaction many years ago per patient  . Prednisone Other (See Comments)    Patient is diabetic, runs sugar up  . Shellfish-Derived Products     Pt is not allergic to iodine, has had iodine in the past w/o premeds and w/ no problems    IV Location/Drains/Wounds Patient Lines/Drains/Airways Status   Active  Line/Drains/Airways    None          Labs/Imaging Results for orders placed or performed during the hospital encounter of 08/28/18 (from the past 48 hour(s))  Glucose, capillary     Status: Abnormal   Collection Time: 08/28/18  3:36 PM  Result Value Ref Range   Glucose-Capillary 124 (H) 70 - 99 mg/dL   Comment 1 Notify RN    Comment 2 Document in Chart   Protime-INR     Status: None   Collection Time: 08/28/18  3:56 PM  Result Value Ref Range   Prothrombin Time 12.1 11.4 - 15.2 seconds   INR 0.90     Comment: Performed at [facility], [address] [place], [place] [address]  APTT     Status: None   Collection Time: 08/28/18  3:56 PM  Result Value Ref Range   aPTT 30 24 - 36 seconds    Comment: Performed at [facility], [address] [place], [place] [address]  CBC     Status: None   Collection Time: 08/28/18  3:56 PM  Result Value Ref Range   WBC 8.8 4.0 - 10.5 K/uL   RBC 4.95 4.22 - 5.81 MIL/uL   Hemoglobin 14.3 13.0 - 17.0 g/dL   HCT 42.5 39.0 - 52.0 %   MCV 85.9 80.0 - 100.0 fL   MCH 28.9 26.0 - 34.0 pg  MCHC 33.6 30.0 - 36.0 g/dL   RDW 13.1 11.5 - 15.5 %   Platelets 301 150 - 400 K/uL   nRBC 0.0 0.0 - 0.2 %    Comment: Performed at [facility], [address] [place], [place] [address]  Differential     Status: None   Collection Time: 08/28/18  3:56 PM  Result Value Ref Range   Neutrophils Relative % 56 %   Neutro Abs 5.0 1.7 - 7.7 K/uL   Lymphocytes Relative 29 %   Lymphs Abs 2.5 0.7 - 4.0 K/uL   Monocytes Relative 8 %   Monocytes Absolute 0.7 0.1 - 1.0 K/uL   Eosinophils Relative 5 %   Eosinophils Absolute 0.4 0.0 - 0.5 K/uL   Basophils Relative 1 %   Basophils Absolute 0.1 0.0 - 0.1 K/uL   Immature Granulocytes 1 %   Abs Immature Granulocytes 0.07 0.00 - 0.07 K/uL    Comment: Performed at [facility], [address] [place], [place] [address]  Comprehensive metabolic panel     Status: Abnormal    Collection Time: 08/28/18  3:56 PM  Result Value Ref Range   Sodium 135 135 - 145 mmol/L   Potassium 4.0 3.5 - 5.1 mmol/L   Chloride 101 98 - 111 mmol/L   CO2 25 22 - 32 mmol/L   Glucose, Bld 144 (H) 70 - 99 mg/dL   BUN 24 (H) 6 - 20 mg/dL   Creatinine, Ser 1.94 (H) 0.61 - 1.24 mg/dL   Calcium 8.6 (L) 8.9 - 10.3 mg/dL   Total Protein 6.5 6.5 - 8.1 g/dL   Albumin 2.9 (L) 3.5 - 5.0 g/dL   AST 26 15 - 41 U/L   ALT 19 0 - 44 U/L   Alkaline Phosphatase 84 38 - 126 U/L   Total Bilirubin 0.6 0.3 - 1.2 mg/dL   GFR calc non Af Amer 40 (L) >60 mL/min   GFR calc Af Amer 46 (L) >60 mL/min    Comment: (NOTE) The eGFR has been calculated using the CKD EPI equation. This calculation has not been validated in all clinical situations. eGFR's persistently <60 mL/min signify possible Chronic Kidney Disease.    Anion gap 9 5 - 15    Comment: Performed at [facility], [address] [place], [place] [address]  Troponin I - Once     Status: None   Collection Time: 08/28/18  3:56 PM  Result Value Ref Range   Troponin I <0.03 <0.03 ng/mL    Comment: Performed at [facility], [address] [place], [place] [address]   Ct Head Code Stroke Wo Contrast  Result Date: 08/28/2018 CLINICAL DATA:  Code stroke. TIA. Headache blurred vision dizziness numbness both arms EXAM: CT HEAD WITHOUT CONTRAST TECHNIQUE: Contiguous axial images were obtained from the base of the skull through the vertex without intravenous contrast. COMPARISON:  CT head 12/16/2014 FINDINGS: Brain: No evidence of acute infarction, hemorrhage, hydrocephalus, extra-axial collection or mass lesion/mass effect. Vascular: Negative for hyperdense vessel Skull: Negative Sinuses/Orbits: Negative Other: None ASPECTS ([place] Stroke Program Early CT Score) - Ganglionic level infarction (caudate, lentiform nuclei, internal capsule, insula, M1-M3 cortex): 7 - Supraganglionic infarction (M4-M6 cortex): 3 Total score (0-10 with 10  being normal): 10 IMPRESSION: 1. Negative CT head 2. ASPECTS is 10 3. These results were called by telephone at the time of interpretation on 08/28/2018 at 3:54 pm to Dr. Lavonia Drafts , who verbally acknowledged these results. Electronically Signed  By: Franchot Gallo M.D.   On: 08/28/2018 15:54    Pending Labs Unresulted Labs (From admission, onward)    Start     Ordered   08/28/18 1805  Lipid panel  Once,   STAT     08/28/18 1804   Signed and Held  HIV antibody (Routine Testing)  Once,   R     Signed and Held   Signed and Held  Hemoglobin A1c  Tomorrow morning,   R     Signed and Held   Signed and Held  Lipid panel  Tomorrow morning,   R    Comments:  Fasting    Signed and Held   Signed and Held  CBC  (enoxaparin (LOVENOX)    CrCl >/= 30 ml/min)  Once,   R    Comments:  Baseline for enoxaparin therapy IF NOT ALREADY DRAWN.  Notify MD if PLT < 100 K.    Signed and Held   Signed and Held  Creatinine, serum  (enoxaparin (LOVENOX)    CrCl >/= 30 ml/min)  Once,   R    Comments:  Baseline for enoxaparin therapy IF NOT ALREADY DRAWN.    Signed and Held   Signed and Held  Creatinine, serum  (enoxaparin (LOVENOX)    CrCl >/= 30 ml/min)  Weekly,   R    Comments:  while on enoxaparin therapy    Signed and Held   Signed and Held  Hemoglobin A1c  Once,   R    Comments:  To assess prior glycemic control    Signed and Held          Vitals/Pain Today's Vitals   08/28/18 1730 08/28/18 1745 08/28/18 1800 08/28/18 1815  BP: (!) 171/97 (!) 181/102 (!) 171/104 (!) 157/93  Pulse: 81 84 82 81  Resp: 16 (!) 25 19 (!) 8  Temp:      SpO2: 94% 97% 97% 96%  Weight:      Height:      PainSc:        Isolation Precautions No active isolations  Medications Medications  HYDROmorphone (DILAUDID) injection 1 mg (has no administration in time range)  ketorolac (TORADOL) 15 MG/ML injection 15 mg (has no administration in time range)  morphine 4 MG/ML injection 4 mg (4 mg Intravenous Given  08/28/18 1619)  ondansetron (ZOFRAN) injection 4 mg (4 mg Intravenous Given 08/28/18 1620)  metoCLOPramide (REGLAN) injection 10 mg (10 mg Intravenous Given 08/28/18 1642)  HYDROmorphone (DILAUDID) injection 0.5 mg (0.5 mg Intravenous Given 08/28/18 1812)    Mobility walks

## 2018-08-28 NOTE — ED Provider Notes (Signed)
[facility] Emergency Department Provider Note   ____________________________________________    I have reviewed the triage vital signs and the nursing notes.   HISTORY  Chief Complaint Code Stroke     HPI [name] is a 47 y.o. male with a history of diabetes, chronic kidney disease presents for headache and apparent brief word finding difficulty.  Patient was apparently at his primary care physician's office when he developed a severe headache and had difficulty finding words for a couple of seconds.  He appears to be back at baseline now.  No extremity weakness.  Code stroke called by triage.  Headache is improving now   Past Medical History:  Diagnosis Date  . CKD (chronic kidney disease), stage II   . Gastroparesis   . Herniated cervical disc   . HTN (hypertension)   . Hypercholesteremia   . S/P cardiac cath    a. 10-15 yrs ago at [facility] due to tachycardia, reportedly normal. b. Normal ETT 03/2012.  Marland Kitchen Sinus tachycardia    a. 24-hr Holter 03/2012 - SR, occ PVCs, no VT, avg HR 96bpm.  . Type 1 diabetes mellitus ([place])    a. With insulin pump.    Patient Active Problem List   Diagnosis Date Noted  . TIA (transient ischemic attack) 08/28/2018  . Erectile dysfunction 09/08/2017  . Class 2 severe obesity due to excess calories with serious comorbidity and body mass index (BMI) of 35.0 to 35.9 in adult ([place]) 08/02/2017  . CKD (chronic kidney disease) stage 3, GFR 30-59 ml/min (H[facility]) 04/22/2017  . Type 1 diabetes mellitus with diabetic neuropathy ([place]) 02/25/2017  . Uncontrolled type 1 diabetes mellitus with hyperglycemia ([place]) 02/25/2017  . Leg pain   . Acute kidney injury ([place])   . Sepsis ([place]) 09/07/2015  . Acute on chronic kidney failure ([place]) 09/07/2015  . Cellulitis of leg, right 09/07/2015  . Herniated cervical disc   . Hypercholesteremia   . HTN (hypertension)   . Nausea & vomiting 10/19/2012  . Diarrhea 10/19/2012  . DKA  (diabetic ketoacidoses) ([place]) 10/19/2012  . Chest pain 03/13/2012  . Dyspnea 03/13/2012  . Tachycardia 03/13/2012  . IDDM (insulin dependent diabetes mellitus) ([place]) 03/13/2012    Past Surgical History:  Procedure Laterality Date  . cervical neck fusion    . HERNIA REPAIR     bilateral  . LEFT HEART CATHETERIZATION WITH CORONARY ANGIOGRAM N/A 12/18/2013   Procedure: LEFT HEART CATHETERIZATION WITH CORONARY ANGIOGRAM;  Surgeon: [name] M Martinique, MD;  Location[facility] CATH LAB;  Service: Cardiovascular;  Laterality: N/A;    Prior to Admission medications   Medication Sig Start Date End Date Taking? Authorizing Provider  atorvastatin (LIPITOR) 80 MG tablet Take 80 mg by mouth daily.   Yes [provider]  BAYER CONTOUR NEXT TEST test strip Inject 1 strip into the skin 4 (four) times daily as needed. 12/23/16  Yes [provider]  budesonide-formoterol (SYMBICORT) 160-4.5 MCG/ACT inhaler Inhale 2 puffs into the lungs 2 (two) times daily. 03/21/18  Yes Flora Lipps, MD  carvedilol (COREG) 3.125 MG tablet Take 3.125 mg by mouth 2 (two) times daily.   Yes [provider]  doxycycline (VIBRA-TABS) 100 MG tablet Take 1 tablet (100 mg total) by mouth 2 (two) times daily. 08/01/18  Yes Edrick Kins, DPM  furosemide (LASIX) 40 MG tablet Take 40 mg by mouth daily.   Yes [provider]  gentamicin cream (GARAMYCIN) 0.1 % Apply 1 application topically  2 (two) times daily. 06/27/18  Yes Edrick Kins, DPM  HUMALOG 100 UNIT/ML injection Inject 100 Units into the skin daily. 11/11/16  Yes [provider]  losartan (COZAAR) 100 MG tablet Take 100 mg by mouth daily.   Yes [provider]  pantoprazole (PROTONIX) 40 MG tablet Take 1 tablet (40 mg total) by mouth daily. 03/21/18 03/21/19 Yes Kasa, Maretta Bees, MD  amLODipine (NORVASC) 5 MG tablet Take 1 tablet (5 mg total) by mouth daily. 03/31/18   Wellington Hampshire, MD  Papaverine-Phentolamine 30-1 MG/ML SOLN 0.3 mLs by  Intracavernosal route as directed. 04/18/18   Stoioff, Ronda Fairly, MD     Allergies Oxycontin [oxycodone hcl]; Penicillins; Prednisone; and Shellfish-derived products  Family History  Problem Relation Age of Onset  . Hypertension Unknown   . Coronary artery disease Unknown        Mother's side - both her side's grandparents died of heart disease (MIs)  . Diabetes Unknown   . Stroke Unknown        Paternal grandfather (14)  . Prostate cancer Neg Hx   . Bladder Cancer Neg Hx   . Kidney cancer Neg Hx     Social History Social History   Tobacco Use  . Smoking status: Never Smoker  . Smokeless tobacco: Never Used  Substance Use Topics  . Alcohol use: No  . Drug use: No    Review of Systems  Constitutional: No fever/chills Eyes: No visual changes.  ENT: No sore throat. Cardiovascular: Denies chest pain. Respiratory: Denies shortness of breath. Gastrointestinal: No abdominal pain.  No nausea, no vomiting.   Genitourinary: Negative for dysuria. Musculoskeletal: Negative for back pain. Skin: Negative for rash. Neurological: Negative for headaches or weakness   ____________________________________________   PHYSICAL EXAM:  VITAL SIGNS: ED Triage Vitals [08/28/18 1537]  Enc Vitals Group     BP (!) 176/101     Pulse Rate 94     Resp 18     Temp 98.3 F (36.8 C)     Temp src      SpO2 97 %     Weight 115.2 kg (254 lb)     Height 1.803 m (5\' 11" )     Head Circumference      Peak Flow      Pain Score 10     Pain Loc      Pain Edu?      Excl. in [place]?     Constitutional: Alert and oriented. No acute distress.  Eyes: Conjunctivae are normal.  PERRLA, EOMI Head: Atraumatic.  Neck:  Painless ROM Cardiovascular: Normal rate, regular rhythm.  Good peripheral circulation. Respiratory: Normal respiratory effort.  No retractions.  Gastrointestinal: Soft and nontender. No distention  Musculoskeletal: No lower extremity tenderness nor edema.  Warm and well  perfused Neurologic:  Normal speech and language. No gross focal neurologic deficits are appreciated.  Normal strength in all extremities, cranial nerves II through XII are normal Skin:  Skin is warm, dry and intact. No rash noted. Psychiatric: Mood and affect are normal. Speech and behavior are normal.  ____________________________________________   LABS (all labs ordered are listed, but only abnormal results are displayed)  Labs Reviewed  GLUCOSE, CAPILLARY - Abnormal; Notable for the following components:      Result Value   Glucose-Capillary 124 (*)    All other components within normal limits  COMPREHENSIVE METABOLIC PANEL - Abnormal; Notable for the following components:   Glucose, Bld 144 (*)  BUN 24 (*)    Creatinine, Ser 1.94 (*)    Calcium 8.6 (*)    Albumin 2.9 (*)    GFR calc non Af Amer 40 (*)    GFR calc Af Amer 46 (*)    All other components within normal limits  PROTIME-INR  APTT  CBC  DIFFERENTIAL  TROPONIN I  CBG MONITORING, ED   ____________________________________________  EKG  ED ECG REPORT I, Lavonia Drafts, the attending physician, personally viewed and interpreted this ECG.  Date: 08/28/2018  Rhythm: normal sinus rhythm QRS Axis: normal Intervals: normal ST/T Wave abnormalities: normal Narrative Interpretation: no evidence of acute ischemia  ____________________________________________  RADIOLOGY  CT head unremarkable ____________________________________________   PROCEDURES  Procedure(s) performed: No  Procedures   Critical Care performed: No ____________________________________________   INITIAL IMPRESSION / ASSESSMENT AND PLAN / ED COURSE  Pertinent labs & imaging results that were available during my care of the patient were reviewed by me and considered in my medical decision making (see chart for details).  Patient presents with acute onset of headache and possibly some word finding difficulty.  At baseline now.  Is  being evaluated by tele-neurology, pending their recommendations.  Neurology recommends admission, given creatinine we will hold off on CT angio at this time  Patient given IV morphine IV Zofran and IV Reglan with significant improvement in headache    ____________________________________________   FINAL CLINICAL IMPRESSION(S) / ED DIAGNOSES  Final diagnoses:  Complicated migraine        Note:  This document was prepared using Dragon voice recognition software and may include unintentional dictation errors.    Lavonia Drafts, MD 08/28/18 [phone]

## 2018-08-28 NOTE — Code Documentation (Signed)
Pt arrives with complaints of word finding, states he was at work at 1430 when he felt as if he couldn't get his words out, states he went to his PCP who found his BP was high and then sent him to the ED, hx of DM type 1 with an insulin pump in place, also states ulcer on his left foot, currently being treated with abx, code stroke activated in triage, after Ct pt taken to room 9, initial NIHSS 0 with resolved symptoms, pt states headache and elevated BP, states recent change in BP medication, no tPA due to resolved symptoms, report off to [employer]

## 2018-08-28 NOTE — ED Notes (Signed)
CODE  STROKE  CALLED  AT 15:39PM TO 333

## 2018-08-28 NOTE — H&P (Addendum)
[name] at [place] NAME: [name]    MR#:  [id number]  DATE OF BIRTH:  12-10-1970  DATE OF ADMISSION:  08/28/2018  PRIMARY CARE PHYSICIAN: [name], MD   REQUESTING/REFERRING PHYSICIAN:  Dr Corky Downs  CHIEF COMPLAINT:   Headache and confusion HISTORY OF PRESENT ILLNESS:  [name]  is a 47 y.o. male with a known history of chronic kidney disease stage II, insulin-dependent diabetes and essential hypertension who presents today to the ER due to above complaint.  Patient reports he was at work this afternoon when he developed sudden onset of confusion which lasted approximately 15 minutes.  He denies any other neurological deficits.  Shortly after his symptoms started subsiding he developed a 10 out of 10 headache.  He denies nausea, vomiting or meningeal signs.  He was in his usual state of health this morning and yesterday.  He denies chest pain or shortness of breath.  In the emergency room CT the head was negative.   Telemetry neurology was contacted by ED physician who recommended admission and further work-up for TIA/CVA.  Patient received morphine for his headache however continues to have a headache.  He reports this is not the worst headache of his life.  PAST MEDICAL HISTORY:   Past Medical History:  Diagnosis Date  . CKD (chronic kidney disease), stage II   . Gastroparesis   . Herniated cervical disc   . HTN (hypertension)   . Hypercholesteremia   . S/P cardiac cath    a. 10-15 yrs ago at [facility] due to tachycardia, reportedly normal. b. Normal ETT 03/2012.  Marland Kitchen Sinus tachycardia    a. 24-hr Holter 03/2012 - SR, occ PVCs, no VT, avg HR 96bpm.  . Type 1 diabetes mellitus ([place])    a. With insulin pump.    PAST SURGICAL HISTORY:   Past Surgical History:  Procedure Laterality Date  . cervical neck fusion    . HERNIA REPAIR     bilateral  . LEFT HEART CATHETERIZATION WITH CORONARY ANGIOGRAM N/A 12/18/2013    Procedure: LEFT HEART CATHETERIZATION WITH CORONARY ANGIOGRAM;  Surgeon: [name] M Martinique, MD;  Location: [facility] CATH LAB;  Service: Cardiovascular;  Laterality: N/A;    SOCIAL HISTORY:   Social History   Tobacco Use  . Smoking status: Never Smoker  . Smokeless tobacco: Never Used  Substance Use Topics  . Alcohol use: No    FAMILY HISTORY:   Family History  Problem Relation Age of Onset  . Hypertension Unknown   . Coronary artery disease Unknown        Mother's side - both her side's grandparents died of heart disease (MIs)  . Diabetes Unknown   . Stroke Unknown        Paternal grandfather (108)  . Prostate cancer Neg Hx   . Bladder Cancer Neg Hx   . Kidney cancer Neg Hx     DRUG ALLERGIES:   Allergies  Allergen Reactions  . Oxycontin [Oxycodone Hcl] Nausea And Vomiting  . Penicillins Other (See Comments)    Possible reaction many years ago per patient  . Prednisone Other (See Comments)    Patient is diabetic, runs sugar up  . Shellfish-Derived Products     Pt is not allergic to iodine, has had iodine in the past w/o premeds and w/ no problems    REVIEW OF SYSTEMS:   Review of Systems  Constitutional: Negative.  Negative for chills, fever and malaise/fatigue.  HENT: Negative.  Negative for ear discharge, ear pain, hearing loss, nosebleeds and sore throat.   Eyes: Negative.  Negative for blurred vision and pain.  Respiratory: Negative.  Negative for cough, hemoptysis, shortness of breath and wheezing.   Cardiovascular: Negative.  Negative for chest pain, palpitations and leg swelling.  Gastrointestinal: Negative.  Negative for abdominal pain, blood in stool, diarrhea, nausea and vomiting.  Genitourinary: Negative.  Negative for dysuria.  Musculoskeletal: Negative.  Negative for back pain.  Skin: Negative.   Neurological: Positive for headaches. Negative for dizziness, tremors, speech change, focal weakness and seizures.  Endo/Heme/Allergies: Negative.  Does not  bruise/bleed easily.  Psychiatric/Behavioral: Negative.  Negative for depression, hallucinations and suicidal ideas.    MEDICATIONS AT HOME:   Prior to Admission medications   Medication Sig Start Date End Date Taking? Authorizing Provider  atorvastatin (LIPITOR) 80 MG tablet Take 80 mg by mouth daily.   Yes [provider]  BAYER CONTOUR NEXT TEST test strip Inject 1 strip into the skin 4 (four) times daily as needed. 12/23/16  Yes [provider]  budesonide-formoterol (SYMBICORT) 160-4.5 MCG/ACT inhaler Inhale 2 puffs into the lungs 2 (two) times daily. 03/21/18  Yes Flora Lipps, MD  carvedilol (COREG) 3.125 MG tablet Take 3.125 mg by mouth 2 (two) times daily.   Yes [provider]  doxycycline (VIBRA-TABS) 100 MG tablet Take 1 tablet (100 mg total) by mouth 2 (two) times daily. 08/01/18  Yes Edrick Kins, DPM  furosemide (LASIX) 40 MG tablet Take 40 mg by mouth daily.   Yes [provider]  gentamicin cream (GARAMYCIN) 0.1 % Apply 1 application topically 2 (two) times daily. 06/27/18  Yes Edrick Kins, DPM  HUMALOG 100 UNIT/ML injection Inject 100 Units into the skin daily. 11/11/16  Yes [provider]  losartan (COZAAR) 100 MG tablet Take 100 mg by mouth daily.   Yes [provider]  pantoprazole (PROTONIX) 40 MG tablet Take 1 tablet (40 mg total) by mouth daily. 03/21/18 03/21/19 Yes Kasa, Maretta Bees, MD  amLODipine (NORVASC) 5 MG tablet Take 1 tablet (5 mg total) by mouth daily. 03/31/18   Wellington Hampshire, MD  Papaverine-Phentolamine 30-1 MG/ML SOLN 0.3 mLs by Intracavernosal route as directed. 04/18/18   Stoioff, Ronda Fairly, MD      VITAL SIGNS:  Blood pressure (!) 179/119, pulse 90, temperature 98.3 F (36.8 C), resp. rate (!) 22, height 5\' 11"  (1.803 m), weight 115.2 kg, SpO2 100 %.  PHYSICAL EXAMINATION:   Physical Exam  Constitutional: He is oriented to person, place, and time. No distress.  HENT:  Head: Normocephalic.  Eyes:  No scleral icterus.  Neck: Normal range of motion. Neck supple. No JVD present. No tracheal deviation present.  Cardiovascular: Normal rate, regular rhythm and normal heart sounds. Exam reveals no gallop and no friction rub.  No murmur heard. Pulmonary/Chest: Effort normal and breath sounds normal. No respiratory distress. He has no wheezes. He has no rales. He exhibits no tenderness.  Abdominal: Soft. Bowel sounds are normal. He exhibits no distension and no mass. There is no tenderness. There is no rebound and no guarding.  Musculoskeletal: Normal range of motion. He exhibits no edema.  Neurological: He is alert and oriented to person, place, and time.  Skin: Skin is warm. No rash noted. No erythema.  Psychiatric: Judgment normal.      LABORATORY PANEL:   CBC Recent Labs  Lab 08/28/18 1556  WBC 8.8  HGB 14.3  HCT  42.5  PLT 301   ------------------------------------------------------------------------------------------------------------------  Chemistries  Recent Labs  Lab 08/28/18 1556  NA 135  K 4.0  CL 101  CO2 25  GLUCOSE 144*  BUN 24*  CREATININE 1.94*  CALCIUM 8.6*  AST 26  ALT 19  ALKPHOS 84  BILITOT 0.6   ------------------------------------------------------------------------------------------------------------------  Cardiac Enzymes Recent Labs  Lab 08/28/18 1556  TROPONINI <0.03   ------------------------------------------------------------------------------------------------------------------  RADIOLOGY:  Ct Head Code Stroke Wo Contrast  Result Date: 08/28/2018 CLINICAL DATA:  Code stroke. TIA. Headache blurred vision dizziness numbness both arms EXAM: CT HEAD WITHOUT CONTRAST TECHNIQUE: Contiguous axial images were obtained from the base of the skull through the vertex without intravenous contrast. COMPARISON:  CT head 12/16/2014 FINDINGS: Brain: No evidence of acute infarction, hemorrhage, hydrocephalus, extra-axial collection or mass  lesion/mass effect. Vascular: Negative for hyperdense vessel Skull: Negative Sinuses/Orbits: Negative Other: None ASPECTS ([place] Stroke Program Early CT Score) - Ganglionic level infarction (caudate, lentiform nuclei, internal capsule, insula, M1-M3 cortex): 7 - Supraganglionic infarction (M4-M6 cortex): 3 Total score (0-10 with 10 being normal): 10 IMPRESSION: 1. Negative CT head 2. ASPECTS is 10 3. These results were called by telephone at the time of interpretation on 08/28/2018 at 3:54 pm to Dr. Lavonia Drafts , who verbally acknowledged these results. Electronically Signed   By: Franchot Gallo M.D.   On: 08/28/2018 15:54    EKG:   Normal sinus rhythm no ST elevation or depression  IMPRESSION AND PLAN:   47 year old male with history of hypertension, hyperlipidemia and diabetes who presents to the emergency room with confusion and headache.  1.  TIA: Continue CVA work-up including MRI/MRA of the brain, echocardiogram and carotid Doppler. Start aspirin and continue statin. Check lipid panel and A1c Consultation requested with neurology, PT, OT and speech  2.  Headache: Follow-up on neurology consultation Continue supportive care   3.  Essential hypertension: Continue losartan, Norvasc and Coreg  4.  Chronic kidney disease stage II: Creatinine is at baseline  5.  Hyperlipidemia: Continue atorvastatin  6.  Diabetes: Patient has insulin pump Diabetes nurse consultation requested.  All the records are reviewed and case discussed with ED provider. Management plans discussed with the patient and he is in agreement  CODE STATUS: Full  TOTAL TIME TAKING CARE OF THIS PATIENT: 48 minutes.    Keirah Konitzer M.D on 08/28/2018 at 5:58 PM  Between 7am to 6pm - Pager - [phone]  After 6pm go to [email or web address] - password [email or web address] [facility]  Office  [phone]  CC: Primary care physician; [name], MD

## 2018-08-28 NOTE — ED Triage Notes (Addendum)
Pt works here and around 1415 patient started expericing severe headache, blurred vision, dizziness and numbness in both arms. Pt also states some confusion at this time and that all symptoms are back again.  Pt's states no prior hx of cardiac or stroke. Pt states severe headache 10/10 pain at this time. Pt unable to get all words out efficiently.

## 2018-08-29 ENCOUNTER — Observation Stay: Payer: 59

## 2018-08-29 ENCOUNTER — Telehealth: Payer: Self-pay | Admitting: Podiatry

## 2018-08-29 ENCOUNTER — Observation Stay (HOSPITAL_BASED_OUTPATIENT_CLINIC_OR_DEPARTMENT_OTHER)
Admit: 2018-08-29 | Discharge: 2018-08-29 | Disposition: A | Discharge status: Home / Self Care | Payer: 59 | Attending: Internal Medicine | Admitting: Internal Medicine

## 2018-08-29 ENCOUNTER — Encounter: Payer: Self-pay | Admitting: *Deleted

## 2018-08-29 DIAGNOSIS — R410 Disorientation, unspecified: Secondary | ICD-10-CM | POA: Diagnosis not present

## 2018-08-29 DIAGNOSIS — G43109 Migraine with aura, not intractable, without status migrainosus: Secondary | ICD-10-CM

## 2018-08-29 DIAGNOSIS — G459 Transient cerebral ischemic attack, unspecified: Secondary | ICD-10-CM | POA: Diagnosis not present

## 2018-08-29 DIAGNOSIS — E119 Type 2 diabetes mellitus without complications: Secondary | ICD-10-CM | POA: Diagnosis not present

## 2018-08-29 DIAGNOSIS — E1022 Type 1 diabetes mellitus with diabetic chronic kidney disease: Secondary | ICD-10-CM | POA: Diagnosis not present

## 2018-08-29 DIAGNOSIS — I129 Hypertensive chronic kidney disease with stage 1 through stage 4 chronic kidney disease, or unspecified chronic kidney disease: Secondary | ICD-10-CM | POA: Diagnosis not present

## 2018-08-29 DIAGNOSIS — Z880 Allergy status to penicillin: Secondary | ICD-10-CM | POA: Diagnosis not present

## 2018-08-29 DIAGNOSIS — I6523 Occlusion and stenosis of bilateral carotid arteries: Secondary | ICD-10-CM | POA: Diagnosis not present

## 2018-08-29 DIAGNOSIS — I10 Essential (primary) hypertension: Secondary | ICD-10-CM

## 2018-08-29 DIAGNOSIS — Z79899 Other long term (current) drug therapy: Secondary | ICD-10-CM | POA: Diagnosis not present

## 2018-08-29 DIAGNOSIS — E785 Hyperlipidemia, unspecified: Secondary | ICD-10-CM | POA: Diagnosis not present

## 2018-08-29 DIAGNOSIS — L97522 Non-pressure chronic ulcer of other part of left foot with fat layer exposed: Secondary | ICD-10-CM | POA: Diagnosis not present

## 2018-08-29 DIAGNOSIS — Z885 Allergy status to narcotic agent status: Secondary | ICD-10-CM | POA: Diagnosis not present

## 2018-08-29 DIAGNOSIS — R51 Headache: Secondary | ICD-10-CM | POA: Diagnosis not present

## 2018-08-29 DIAGNOSIS — Z888 Allergy status to other drugs, medicaments and biological substances status: Secondary | ICD-10-CM | POA: Diagnosis not present

## 2018-08-29 DIAGNOSIS — R4701 Aphasia: Secondary | ICD-10-CM | POA: Diagnosis not present

## 2018-08-29 LAB — AMMONIA: Ammonia: 15 umol/L (ref 9–35)

## 2018-08-29 LAB — ECHOCARDIOGRAM COMPLETE
Height: 71 in
Weight: 4032 oz

## 2018-08-29 LAB — LIPID PANEL
Cholesterol: 257 mg/dL — ABNORMAL HIGH (ref 0–200)
HDL: 43 mg/dL (ref >40)
LDL Cholesterol: 190 mg/dL — ABNORMAL HIGH (ref 0–99)
Total CHOL/HDL Ratio: 6 RATIO
Triglycerides: 119 mg/dL (ref <150)
VLDL: 24 mg/dL (ref 0–40)

## 2018-08-29 LAB — HEMOGLOBIN A1C
Hgb A1c MFr Bld: 10.9 % — ABNORMAL HIGH (ref 4.8–5.6)
Mean Plasma Glucose: 266.13 mg/dL

## 2018-08-29 LAB — GLUCOSE, CAPILLARY
Glucose-Capillary: 131 mg/dL — ABNORMAL HIGH (ref 70–99)
Glucose-Capillary: 193 mg/dL — ABNORMAL HIGH (ref 70–99)

## 2018-08-29 LAB — CREATININE, SERUM
Creatinine, Ser: 2.02 mg/dL — ABNORMAL HIGH (ref 0.61–1.24)
GFR calc Af Amer: 44 mL/min — ABNORMAL LOW (ref >60)
GFR calc non Af Amer: 38 mL/min — ABNORMAL LOW (ref >60)

## 2018-08-29 MED ORDER — ONDANSETRON HCL 4 MG/2ML IJ SOLN
4.0000 mg | INTRAMUSCULAR | Status: DC | PRN
  Administered 2018-08-29 (×2): 4 mg via INTRAVENOUS
  Filled 2018-08-29 (×2): qty 2

## 2018-08-29 MED ORDER — ASPIRIN EC 81 MG PO TBEC: 81.0000 mg | DELAYED_RELEASE_TABLET | Freq: Every day | ORAL | 0 refills | Status: DC

## 2018-08-29 NOTE — Telephone Encounter (Signed)
New order has been refaxed to [employer]

## 2018-08-29 NOTE — Progress Notes (Signed)
SLP Cancellation Note  Patient Details Name: [name] MRN: [id number] DOB: 1970-12-20   Cancelled treatment:       Reason Eval/Treat Not Completed: SLP screened, no needs identified, will sign off(chart reviewed; consulted NSG ). Pt denied any difficulty swallowing and is currently on a regular diet; tolerates swallowing pills w/ water per NSG. Pt conversed at conversational level w/out deficits noted w/ NSG and staff. denied any speech-language deficits.  No further skilled ST services indicated as pt appears at his baseline. NSG to reconsult if any change in status.     Orinda Kenner, MS, CCC-SLP Amarri Michaelson 08/29/2018, 11:59 AM

## 2018-08-29 NOTE — Telephone Encounter (Signed)
[employer] called stating they received the pts supply order but it is missing frequency of change for [employer]. Please add and refax to the number below.  [phone]

## 2018-08-29 NOTE — Progress Notes (Signed)
PT Cancellation Note  Patient Details Name: [name] MRN: [id number] DOB: 11/02/1970   Cancelled Treatment:    Reason Eval/Treat Not Completed: PT screened, no needs identified, will sign off Pt still with some head ache and elevated BP, but had no PT related issues that need further work up.  He showed good strength, balance, was able to easily and confidently ambulate around the nurses' station with appropriate speed, etc.  He was able to negotiate up/down steps w/o issue and generally reports all visual and other issues are resolved.  No PT needs at this time, will sign off.     Kreg Shropshire, DPT 08/29/2018, 10:29 AM

## 2018-08-29 NOTE — Progress Notes (Signed)
OT Cancellation Note  Patient Details Name: [name] MRN: [id number] DOB: 05-10-1971   Cancelled Treatment:    Reason Eval/Treat Not Completed: Patient at procedure or test/ unavailable.  Order received and chart reviewed.  Pt not available due to going to [place] but most likely will not need OT services after discussion with NSG and PT but will check back again later.  Chrys Racer, OTR/L ascom [phone] 08/29/18, 10:39 AM

## 2018-08-29 NOTE — Progress Notes (Signed)
*  PRELIMINARY RESULTS* Echocardiogram 2D Echocardiogram has been performed.  [name] 08/29/2018, 10:05 AM

## 2018-08-29 NOTE — Plan of Care (Signed)
  Problem: Education: Goal: Knowledge of General Education information will improve Description Including pain rating scale, medication(s)/side effects and non-pharmacologic comfort measures Outcome: Progressing   Problem: Health Behavior/Discharge Planning: Goal: Ability to manage health-related needs will improve Outcome: Progressing   Problem: Clinical Measurements: Goal: Diagnostic test results will improve Outcome: Progressing Goal: Cardiovascular complication will be avoided Outcome: Progressing   Problem: Pain Managment: Goal: General experience of comfort will improve Outcome: Progressing   Problem: Safety: Goal: Ability to remain free from injury will improve Outcome: Progressing   Problem: Education: Goal: Knowledge of disease or condition will improve Outcome: Progressing   Problem: Self-Care: Goal: Ability to participate in self-care as condition permits will improve Outcome: Progressing   Problem: Ischemic Stroke/TIA Tissue Perfusion: Goal: Complications of ischemic stroke/TIA will be minimized Outcome: Progressing

## 2018-08-29 NOTE — Progress Notes (Signed)
Patient refused me to unwrap his wound dressing of his L big toe diabetic ulcer. Patient stated he is under doctor's care for wound treament and does not want dressing removed--is concerned about it getting infected if removed. I told him I would not remove the dressing per his instructions and would place a wound consult.

## 2018-08-29 NOTE — Progress Notes (Addendum)
Inpatient Diabetes Program Recommendations  AACE/ADA: New Consensus Statement on Inpatient Glycemic Control (2019)  Target Ranges:  Prepandial:   less than 140 mg/dL      Peak postprandial:   less than 180 mg/dL (1-2 hours)      Critically ill patients:  140 - 180 mg/dL   Results for [name], [name] (MRN [id number]) as of 08/29/2018 07:53  Ref. Range 08/28/2018 15:36 08/28/2018 20:47 08/29/2018 01:53  Glucose-Capillary Latest Ref Range: 70 - 99 mg/dL 124 (H) 135 (H) 131 (H)   Results for [name], [name] (MRN [id number]) as of 08/29/2018 07:53  Ref. Range 08/28/2018 19:05  Hemoglobin A1C Latest Ref Range: 4.8 - 5.6 % 10.9 (H)   Review of Glycemic Control  Diabetes history: DM1 (makes NO insulin; requires basal, correction, and meal coverage insulin) Outpatient Diabetes medications: Medtronic 670G insulin pump with Humalog (provides basal, correction, and meal coverage insulin) Current orders for Inpatient glycemic control: Insulin Pump ACHS&2am, Novolog 0-15 units TID with meals, Novolog 0-5 units QHS  Inpatient Diabetes Program Recommendations:  Correction (SSI): Please discontinue Novolog correction scale since patient is using his insulin pump for all insulin needs.  Per chart review, patient is followed by Dr. Gabriel Carina for diabetes management and last office visit with Dr. Gabriel Carina was on 02/06/18. Patient uses a Medtronic 670G insulin pump with Humalog insulin as an outpatient. Patient currently has insulin pump ordered while inpatient. Per Dr. Joycie Peek office note on 02/06/18 the following should be insulin pump settings:   Basal insulin  12A 0.9 units/hour 3A 0.95 units/hour 11A 0.9 units/hour 6P 0.95 units/hour Total daily basal insulin: 22.2 units/24 hours  Carb Coverage 12A 1:8 1 unit for every 8 grams of carbohydrates 6A 1:6 1 unit for every 6 grams of carbohydrates  Insulin Sensitivity 1:28 1 unit drops blood glucose 28 mg/dl  Target Glucose Goals 120-120  mg/dl  Will plan to see patient today to verify insulin pump settings and discuss A1C.    NURSING: If not already done,  please print off the Patient insulin pump contract and flow sheet. The insulin pump contract should be signed by the patient and then placed in the chart. The patient insulin pump flow sheet will be completed by the patient at the bedside and the RN caring for the patient will use the patient's flow sheet to document in the [facility]. RN will need to complete the Nursing Insulin Pump Flowsheet at least once a shift. Patient will need to keep extra insulin pump supplies at the bedside at all times.   Addendum 08/29/18@11 :45-Went by to see patient and was informed patient has already been discharged home.  Thanks, Barnie Alderman, RN, MSN, CDE Diabetes Coordinator Inpatient Diabetes Program [phone] (Team Pager from 8am to 5pm)

## 2018-08-29 NOTE — Progress Notes (Addendum)
Messaged hospitalist about patients c/o headache 8/10 and new onset of nausea. BG checked and WNL. Notified hospitalist of BP 183/110. Awaiting response.  See new order for zofran.

## 2018-08-29 NOTE — Discharge Summary (Signed)
[facility] NAME: [name]    MR#:  [id number]  DATE OF BIRTH:  [birth year]  DATE OF ADMISSION:  08/28/2018 ADMITTING PHYSICIAN: Bettey Costa, MD  DATE OF DISCHARGE: No discharge date for patient encounter.  PRIMARY CARE PHYSICIAN: [name], MD    ADMISSION DIAGNOSIS:  Complicated migraine [W40.973]  DISCHARGE DIAGNOSIS:  Active Problems:   TIA (transient ischemic attack)   SECONDARY DIAGNOSIS:   Past Medical History:  Diagnosis Date  . CKD (chronic kidney disease), stage II   . Gastroparesis   . Herniated cervical disc   . HTN (hypertension)   . Hypercholesteremia   . S/P cardiac cath    a. 10-15 yrs ago at [facility] due to tachycardia, reportedly normal. b. Normal ETT 03/2012.  [facility] Sinus tachycardia    a. 24-hr Holter 03/2012 - SR, occ PVCs, no VT, avg HR 96bpm.  . Type 1 diabetes mellitus ([place])    a. With insulin pump.    HOSPITAL COURSE:   47 year old male with history of hypertension, hyperlipidemia and diabetes who presents to the emergency room with confusion and headache.  *Acute TIA Resolved Treated on our CVA protocol, MRA/MRI of the brain was negative for any acute process, echocardiogram and carotid Dopplers pending at the time of discharge, patient treated with aspirin and statin therapy, neurology to see patient while in house, and patient did well  *Acute headache Resolved  *Acute on chronic uncontrolled independent diabetes mellitus Secondary to noncompliance with medical management-his insulin pump as well as dietary indiscretion Controlled with insulin pump, patient given education while in house  *Essential hypertension Continued losartan, Norvasc and Coreg  *Chronic diabetic nephropathy, stage II At baseline Avoid nephrotoxic agents while in house  *Hyperlipidemia Continue atorvastatin   DISCHARGE CONDITIONS:   stable  CONSULTS OBTAINED:  Treatment Team:   Alexis Goodell, MD Catarina Hartshorn, MD  DRUG ALLERGIES:   Allergies  Allergen Reactions  . Oxycontin [Oxycodone Hcl] Nausea And Vomiting  . Penicillins Other (See Comments)    Possible reaction many years ago per patient  . Prednisone Other (See Comments)    Patient is diabetic, runs sugar up  . Shellfish-Derived Products     Pt is not allergic to iodine, has had iodine in the past w/o premeds and w/ no problems    DISCHARGE MEDICATIONS:   Allergies as of 08/29/2018      Reactions   Oxycontin [oxycodone Hcl] Nausea And Vomiting   Penicillins Other (See Comments)   Possible reaction many years ago per patient   Prednisone Other (See Comments)   Patient is diabetic, runs sugar up   Shellfish-derived Products    Pt is not allergic to iodine, has had iodine in the past w/o premeds and w/ no problems      Medication List    TAKE these medications   amLODipine 5 MG tablet Commonly known as:  NORVASC Take 1 tablet (5 mg total) by mouth daily.   aspirin EC 81 MG tablet Take 1 tablet (81 mg total) by mouth daily.   atorvastatin 80 MG tablet Commonly known as:  LIPITOR Take 80 mg by mouth daily.   BAYER CONTOUR NEXT TEST test strip Generic drug:  glucose blood Inject 1 strip into the skin 4 (four) times daily as needed.   budesonide-formoterol 160-4.5 MCG/ACT inhaler Commonly known as:  SYMBICORT Inhale 2 puffs into the lungs 2 (two) times daily.   carvedilol 3.125 MG tablet  Commonly known as:  COREG Take 3.125 mg by mouth 2 (two) times daily.   doxycycline 100 MG tablet Commonly known as:  VIBRA-TABS Take 1 tablet (100 mg total) by mouth 2 (two) times daily.   furosemide 40 MG tablet Commonly known as:  LASIX Take 40 mg by mouth daily.   gentamicin cream 0.1 % Commonly known as:  GARAMYCIN Apply 1 application topically 2 (two) times daily.   HUMALOG 100 UNIT/ML injection Generic drug:  insulin lispro Inject 100 Units into the skin daily.   losartan  100 MG tablet Commonly known as:  COZAAR Take 100 mg by mouth daily.   pantoprazole 40 MG tablet Commonly known as:  PROTONIX Take 1 tablet (40 mg total) by mouth daily.   Papaverine-Phentolamine 30-1 MG/ML Soln 0.3 mLs by Intracavernosal route as directed.        DISCHARGE INSTRUCTIONS:      If you experience worsening of your admission symptoms, develop shortness of breath, life threatening emergency, suicidal or homicidal thoughts you must seek medical attention immediately by calling 911 or calling your MD immediately  if symptoms less severe.  You Must read complete instructions/literature along with all the possible adverse reactions/side effects for all the Medicines you take and that have been prescribed to you. Take any new Medicines after you have completely understood and accept all the possible adverse reactions/side effects.   Please note  You were cared for by a hospitalist during your hospital stay. If you have any questions about your discharge medications or the care you received while you were in the hospital after you are discharged, you can call the unit and asked to speak with the hospitalist on call if the hospitalist that took care of you is not available. Once you are discharged, your primary care physician will handle any further medical issues. Please note that NO REFILLS for any discharge medications will be authorized once you are discharged, as it is imperative that you return to your primary care physician (or establish a relationship with a primary care physician if you do not have one) for your aftercare needs so that they can reassess your need for medications and monitor your lab values.    Today   CHIEF COMPLAINT:   Chief Complaint  Patient presents with  . Code Stroke    HISTORY OF PRESENT ILLNESS:   47 y.o. male with a known history of chronic kidney disease stage II, insulin-dependent diabetes and essential hypertension who presents today  to the ER due to above complaint.  Patient reports he was at work this afternoon when he developed sudden onset of confusion which lasted approximately 15 minutes.  He denies any other neurological deficits.  Shortly after his symptoms started subsiding he developed a 10 out of 10 headache.  He denies nausea, vomiting or meningeal signs.  He was in his usual state of health this morning and yesterday.  He denies chest pain or shortness of breath.  In the emergency room CT the head was negative.   Telemetry neurology was contacted by ED physician who recommended admission and further work-up for TIA/CVA.  Patient received morphine for his headache however continues to have a headache.  He reports this is not the worst headache of his life.  VITAL SIGNS:  Blood pressure (!) 165/101, pulse 95, temperature (!) 97.4 F (36.3 C), temperature source Oral, resp. rate 17, height 5\' 11"  (1.803 m), weight 114.6 kg, SpO2 95 %.  I/O:    Intake/Output  Summary (Last 24 hours) at 08/29/2018 1033 Last data filed at 08/28/2018 1948 Gross per 24 hour  Intake 240 ml  Output -  Net 240 ml    PHYSICAL EXAMINATION:  GENERAL:  47 y.o.-year-old patient lying in the bed with no acute distress.  EYES: Pupils equal, round, reactive to light and accommodation. No scleral icterus. Extraocular muscles intact.  HEENT: Head atraumatic, normocephalic. Oropharynx and nasopharynx clear.  NECK:  Supple, no jugular venous distention. No thyroid enlargement, no tenderness.  LUNGS: Normal breath sounds bilaterally, no wheezing, rales,rhonchi or crepitation. No use of accessory muscles of respiration.  CARDIOVASCULAR: S1, S2 normal. No murmurs, rubs, or gallops.  ABDOMEN: Soft, non-tender, non-distended. Bowel sounds present. No organomegaly or mass.  EXTREMITIES: No pedal edema, cyanosis, or clubbing.  NEUROLOGIC: Cranial nerves II through XII are intact. Muscle strength 5/5 in all extremities. Sensation intact. Gait not  checked.  PSYCHIATRIC: The patient is alert and oriented x 3.  SKIN: No obvious rash, lesion, or ulcer.   DATA REVIEW:   CBC Recent Labs  Lab 08/28/18 1905  WBC 7.5  HGB 13.6  HCT 41.2  PLT 282    Chemistries  Recent Labs  Lab 08/28/18 1556 08/28/18 1905  NA 135  --   K 4.0  --   CL 101  --   CO2 25  --   GLUCOSE 144*  --   BUN 24*  --   CREATININE 1.94* 2.02*  CALCIUM 8.6*  --   AST 26  --   ALT 19  --   ALKPHOS 84  --   BILITOT 0.6  --     Cardiac Enzymes Recent Labs  Lab 08/28/18 1556  TROPONINI <0.03    Microbiology Results  Results for orders placed or performed in visit on 08/25/18  WOUND CULTURE     Status: None   Collection Time: 08/25/18 12:13 PM  Result Value Ref Range Status   Gram Stain Result Final report  Final   Organism ID, Bacteria Comment  Final    Comment: No white blood cells seen.   Organism ID, Bacteria No organisms seen  Final   Aerobic Bacterial Culture Final report  Final   Organism ID, Bacteria Routine flora  Final    Comment: Scant growth    RADIOLOGY:  Mr Brain Wo Contrast  Result Date: 08/29/2018 CLINICAL DATA:  Sudden onset of confusion.  Severe headache. EXAM: MRI HEAD WITHOUT CONTRAST MRA HEAD WITHOUT CONTRAST TECHNIQUE: Multiplanar, multiecho pulse sequences of the brain and surrounding structures were obtained without intravenous contrast. Angiographic images of the head were obtained using MRA technique without contrast. COMPARISON:  Head CT 08/28/2018 FINDINGS: MRI HEAD FINDINGS BRAIN: There is no acute infarct, acute hemorrhage or mass effect. The midline structures are normal. There are no old infarcts. The white matter signal is normal for the patient's age. The CSF spaces are normal for age, with no hydrocephalus. Susceptibility-sensitive sequences show no chronic microhemorrhage or superficial siderosis. SKULL AND UPPER CERVICAL SPINE: The visualized skull base, calvarium, upper cervical spine and extracranial soft  tissues are normal. SINUSES/ORBITS: No fluid levels or advanced mucosal thickening. No mastoid or middle ear effusion. The orbits are normal. MRA HEAD FINDINGS Intracranial internal carotid arteries: Normal. Anterior cerebral arteries: Normal. Middle cerebral arteries: Normal. Posterior communicating arteries: Present only on the right. Posterior cerebral arteries: Normal. Basilar artery: Normal. Vertebral arteries: Left dominant. Normal. Superior cerebellar arteries: Normal. Inferior cerebellar arteries: Normal. IMPRESSION: 1. Normal MRI of the brain.  2. Normal intracranial MRA. Electronically Signed   By: Ulyses Jarred M.D.   On: 08/29/2018 01:58   Mr Jodene Nam Head/brain ZO Cm  Result Date: 08/29/2018 CLINICAL DATA:  Sudden onset of confusion.  Severe headache. EXAM: MRI HEAD WITHOUT CONTRAST MRA HEAD WITHOUT CONTRAST TECHNIQUE: Multiplanar, multiecho pulse sequences of the brain and surrounding structures were obtained without intravenous contrast. Angiographic images of the head were obtained using MRA technique without contrast. COMPARISON:  Head CT 08/28/2018 FINDINGS: MRI HEAD FINDINGS BRAIN: There is no acute infarct, acute hemorrhage or mass effect. The midline structures are normal. There are no old infarcts. The white matter signal is normal for the patient's age. The CSF spaces are normal for age, with no hydrocephalus. Susceptibility-sensitive sequences show no chronic microhemorrhage or superficial siderosis. SKULL AND UPPER CERVICAL SPINE: The visualized skull base, calvarium, upper cervical spine and extracranial soft tissues are normal. SINUSES/ORBITS: No fluid levels or advanced mucosal thickening. No mastoid or middle ear effusion. The orbits are normal. MRA HEAD FINDINGS Intracranial internal carotid arteries: Normal. Anterior cerebral arteries: Normal. Middle cerebral arteries: Normal. Posterior communicating arteries: Present only on the right. Posterior cerebral arteries: Normal. Basilar  artery: Normal. Vertebral arteries: Left dominant. Normal. Superior cerebellar arteries: Normal. Inferior cerebellar arteries: Normal. IMPRESSION: 1. Normal MRI of the brain. 2. Normal intracranial MRA. Electronically Signed   By: Ulyses Jarred M.D.   On: 08/29/2018 01:58   Ct Head Code Stroke Wo Contrast  Result Date: 08/28/2018 CLINICAL DATA:  Code stroke. TIA. Headache blurred vision dizziness numbness both arms EXAM: CT HEAD WITHOUT CONTRAST TECHNIQUE: Contiguous axial images were obtained from the base of the skull through the vertex without intravenous contrast. COMPARISON:  CT head 12/16/2014 FINDINGS: Brain: No evidence of acute infarction, hemorrhage, hydrocephalus, extra-axial collection or mass lesion/mass effect. Vascular: Negative for hyperdense vessel Skull: Negative Sinuses/Orbits: Negative Other: None ASPECTS ([place] Stroke Program Early CT Score) - Ganglionic level infarction (caudate, lentiform nuclei, internal capsule, insula, M1-M3 cortex): 7 - Supraganglionic infarction (M4-M6 cortex): 3 Total score (0-10 with 10 being normal): 10 IMPRESSION: 1. Negative CT head 2. ASPECTS is 10 3. These results were called by telephone at the time of interpretation on 08/28/2018 at 3:54 pm to Dr. Lavonia Drafts , who verbally acknowledged these results. Electronically Signed   By: Franchot Gallo M.D.   On: 08/28/2018 15:54    EKG:   Orders placed or performed during the hospital encounter of 08/28/18  . ED EKG  . ED EKG      Management plans discussed with the patient, family and they are in agreement.  CODE STATUS:     Code Status Orders  (From admission, onward)         Start     Ordered   08/28/18 1859  Full code  Continuous     08/28/18 1858        Code Status History    Date Active Date Inactive Code Status Order ID Comments User Context   09/07/2015 2222 09/09/2015 1854 Full Code [id number]  Ivor Costa, MD ED   12/18/2013 1428 12/18/2013 2139 Full Code [id number]  [place],  [name] M, MD Inpatient   12/17/2013 1944 12/18/2013 1428 Full Code [id number]  Charlie Pitter, PA-C Inpatient   10/19/2012 1427 10/21/2012 1214 Full Code [id number]  Erma Heritage, RN Inpatient      TOTAL TIME TAKING CARE OF THIS PATIENT: 40 minutes.    Avel Peace Salary M.D on 08/29/2018 at 10:33 AM  Between 7am to 6pm - Pager - [phone]  After 6pm go to [email or web address] - password [email or web address] [facility]  Office  [phone]  CC: Primary care physician; [name], MD   Note: This dictation was prepared with Dragon dictation along with smaller phrase technology. Any transcriptional errors that result from this process are unintentional.

## 2018-08-29 NOTE — Consult Note (Signed)
Referring Physician:     Chief Complaint: Headache, blurry vision, word finding difficulty  HPI: [name] is an 47 y.o. male past medical history of chronic kidney disease stage II, gastroparesis, hypertension, hyperlipidemia, diabetes mellitus with diabetic neuropathy presenting to the ED from PCP office with strokelike symptoms.  Patient states that he was at work at around 2 PM when he suddenly developed an episode of confusion, word finding difficulty and blurry vision.  Symptoms lasted about 15 minutes and he developed severe headache and achiness in his bilateral upper extremities.  Patient was seen by his PCP for evaluation of symptoms who in turn sent him for further evaluation in the ED.  Patient described headache as throbbing severe in intensity, located predominantly in bifrontral and bilateral supraorbital.  Describes associated symptoms of light and noise sensitivity, Denies associated altered sensorium, cranial nerve deficit, seizures, focal motor or sensory deficits, diplopia, vomiting, syncope or LOC, paresthesia (numbness, tingling, pins-and-needles sensation) or a heavy feeling in an extremity.  He however endorses feeling of lightheadedness and dizziness.  Patient states that he has been going and a significant stress of late and think that symptoms might have been brought on by stress.  Denies any depression, sleep apnea, anxiety, or head trauma.  Denies family history of migraine headache.  On arrival to the ED initial NIH stroke scale of 0.  CT head did not show acute intracranial abnormality.  Follow-up MRI of the brain was normal with no acute abnormality noted.  Due to his stroke risk factors patient was admitted for further stroke work-up and evaluation.  Patient state he was not on antiplatelet or anticoagulation prior to this event.  Date last known well: Date: 08/28/2018 Time last known well: Time: 14:00 tPA Given: No: NIH stroke scale of 0, none disabling  symptoms.  Past Medical History:  Diagnosis Date  . CKD (chronic kidney disease), stage II   . Gastroparesis   . Herniated cervical disc   . HTN (hypertension)   . Hypercholesteremia   . S/P cardiac cath    a. 10-15 yrs ago at [facility] due to tachycardia, reportedly normal. b. Normal ETT 03/2012.  Marland Kitchen Sinus tachycardia    a. 24-hr Holter 03/2012 - SR, occ PVCs, no VT, avg HR 96bpm.  . Type 1 diabetes mellitus ([place])    a. With insulin pump.    Past Surgical History:  Procedure Laterality Date  . cervical neck fusion    . HERNIA REPAIR     bilateral  . LEFT HEART CATHETERIZATION WITH CORONARY ANGIOGRAM N/A 12/18/2013   Procedure: LEFT HEART CATHETERIZATION WITH CORONARY ANGIOGRAM;  Surgeon: [name] M Martinique, MD;  [facility];  Service: Cardiovascular;  Laterality: N/A;    Family History  Problem Relation Age of Onset  . Hypertension Unknown   . Coronary artery disease Unknown        Mother's side - both her side's grandparents died of heart disease (MIs)  . Diabetes Unknown   . Stroke Unknown        Paternal grandfather (40)  . Prostate cancer Neg Hx   . Bladder Cancer Neg Hx   . Kidney cancer Neg Hx    Social History:  reports that he has never smoked. He has never used smokeless tobacco. He reports that he does not drink alcohol or use drugs.  Allergies:  Allergies  Allergen Reactions  . Oxycontin [Oxycodone Hcl] Nausea And Vomiting  . Penicillins Other (See Comments)    Possible  reaction many years ago per patient  . Prednisone Other (See Comments)    Patient is diabetic, runs sugar up  . Shellfish-Derived Products     Pt is not allergic to iodine, has had iodine in the past w/o premeds and w/ no problems    Medications:  I have reviewed the patient's current medications. Prior to Admission:  Medications Prior to Admission  Medication Sig Dispense Refill Last Dose  . atorvastatin (LIPITOR) 80 MG tablet Take 80 mg by mouth daily.   08/28/2018 at 0800  . BAYER  CONTOUR NEXT TEST test strip Inject 1 strip into the skin 4 (four) times daily as needed.  11 Taking  . budesonide-formoterol (SYMBICORT) 160-4.5 MCG/ACT inhaler Inhale 2 puffs into the lungs 2 (two) times daily. 1 Inhaler 12 90+ days at Unknown  . carvedilol (COREG) 3.125 MG tablet Take 3.125 mg by mouth 2 (two) times daily.   08/28/2018 at 0800  . doxycycline (VIBRA-TABS) 100 MG tablet Take 1 tablet (100 mg total) by mouth 2 (two) times daily. 20 tablet 0 08/28/2018 at 0800  . furosemide (LASIX) 40 MG tablet Take 40 mg by mouth daily.   08/28/2018 at 0800  . gentamicin cream (GARAMYCIN) 0.1 % Apply 1 application topically 2 (two) times daily. 30 g 1 Taking  . HUMALOG 100 UNIT/ML injection Inject 100 Units into the skin daily.  11 Taking  . losartan (COZAAR) 100 MG tablet Take 100 mg by mouth daily.   08/28/2018 at 0800  . pantoprazole (PROTONIX) 40 MG tablet Take 1 tablet (40 mg total) by mouth daily. 30 tablet 1 08/28/2018 at 0800  . amLODipine (NORVASC) 5 MG tablet Take 1 tablet (5 mg total) by mouth daily.   Taking  . Papaverine-Phentolamine 30-1 MG/ML SOLN 0.3 mLs by Intracavernosal route as directed.   Taking   Scheduled: . aspirin  300 mg Rectal Daily   Or  . aspirin  325 mg Oral Daily  . atorvastatin  80 mg Oral Daily  . carvedilol  3.125 mg Oral BID  . enoxaparin (LOVENOX) injection  40 mg Subcutaneous Q24H  . furosemide  40 mg Oral Daily  . insulin pump   Subcutaneous TID AC & HS  . losartan  100 mg Oral Daily  . mometasone-formoterol  2 puff Inhalation BID  . pantoprazole  40 mg Oral Daily  . Papaverine-Phentolamine  0.3 mL Intracavernosal UD    ROS: History obtained from the patient   General ROS: negative for - chills, fatigue, fever, night sweats, weight gain or weight loss Psychological ROS: negative for - behavioral disorder, hallucinations, memory difficulties, mood swings or suicidal ideation Ophthalmic ROS: negative for - blurry vision, double vision, eye pain or  loss of vision ENT ROS: negative for - epistaxis, nasal discharge, oral lesions, sore throat, tinnitus or vertigo Allergy and Immunology ROS: negative for - hives or itchy/watery eyes Hematological and Lymphatic ROS: negative for - bleeding problems, bruising or swollen lymph nodes Endocrine ROS: negative for - galactorrhea, hair pattern changes, polydipsia/polyuria or temperature intolerance Respiratory ROS: negative for - cough, hemoptysis, shortness of breath or wheezing Cardiovascular ROS: negative for - chest pain, dyspnea on exertion, edema or irregular heartbeat Gastrointestinal ROS: negative for - abdominal pain, diarrhea, hematemesis, nausea/vomiting or stool incontinence Genito-Urinary ROS: negative for - dysuria, hematuria, incontinence or urinary frequency/urgency Musculoskeletal ROS: negative for - joint swelling or muscular weakness Neurological ROS: as noted in HPI Dermatological ROS: negative for rash and skin lesion changes  Physical Examination:  Blood pressure (!) 153/79, pulse 89, temperature (!) 97.4 F (36.3 C), temperature source Oral, resp. rate 17, height 5\' 11"  (1.803 m), weight 114.6 kg, SpO2 95 %.   HEENT-  Normocephalic, no lesions, without obvious abnormality.  Normal external eye and conjunctiva.  Normal TM's bilaterally.  Normal auditory canals and external ears. Normal external nose, mucus membranes and septum.  Normal pharynx. Cardiovascular- S1, S2 normal, pulses palpable throughout   Lungs- chest clear, no wheezing, rales, normal symmetric air entry Abdomen- soft, non-tender; bowel sounds normal; no masses,  no organomegaly Extremities- no edema Lymph-no adenopathy palpable Musculoskeletal-no joint tenderness, deformity or swelling Skin-warm and dry, no hyperpigmentation, vitiligo, or suspicious lesions  Neurological Exam   Mental Status: Alert, oriented, thought content appropriate.  Speech fluent without evidence of aphasia.  Able to follow 3 step  commands without difficulty. Attention span and concentration seemed appropriate  Cranial Nerves: II: Discs flat bilaterally; Visual fields grossly normal, pupils equal, round, reactive to light and accommodation III,IV, VI: ptosis not present, extra-ocular motions intact bilaterally V,VII: smile symmetric, facial light touch sensation intact VIII: hearing normal bilaterally IX,X: gag reflex present XI: bilateral shoulder shrug XII: midline tongue extension Motor: Right :  Upper extremity   5/5 Without pronator drift      Left: Upper extremity   5/5 without pronator drift Right:   Lower extremity   5/5                                          Left: Lower extremity   5/5 Tone and bulk:normal tone throughout; no atrophy noted Sensory: Pinprick and light touch intact bilaterally Deep Tendon Reflexes: 2+ and symmetric throughout Plantars: Right: mute                              Left: mute Cerebellar: Finger-to-nose testing intact bilaterally. Heel to shin testing normal bilaterally Gait: not tested due to safety concerns  Data Reviewed  Laboratory Studies:  Basic Metabolic Panel: Recent Labs  Lab 08/28/18 1556 08/28/18 1905  NA 135  --   K 4.0  --   CL 101  --   CO2 25  --   GLUCOSE 144*  --   BUN 24*  --   CREATININE 1.94* 2.02*  CALCIUM 8.6*  --     Liver Function Tests: Recent Labs  Lab 08/28/18 1556  AST 26  ALT 19  ALKPHOS 84  BILITOT 0.6  PROT 6.5  ALBUMIN 2.9*   No results for input(s): LIPASE, AMYLASE in the last 168 hours. Recent Labs  Lab 08/29/18 0823  AMMONIA 15    CBC: Recent Labs  Lab 08/28/18 1556 08/28/18 1905  WBC 8.8 7.5  NEUTROABS 5.0  --   HGB 14.3 13.6  HCT 42.5 41.2  MCV 85.9 86.6  PLT 301 282    Cardiac Enzymes: Recent Labs  Lab 08/28/18 1556  TROPONINI <0.03    BNP: Invalid input(s): POCBNP  CBG: Recent Labs  Lab 08/28/18 1536 08/28/18 2047 08/29/18 0153 08/29/18 0752  GLUCAP 124* 135* 131* 193*     Microbiology: Results for orders placed or performed in visit on 08/25/18  WOUND CULTURE     Status: None   Collection Time: 08/25/18 12:13 PM  Result Value Ref Range Status   Gram Stain Result Final report  Final  Organism ID, Bacteria Comment  Final    Comment: No white blood cells seen.   Organism ID, Bacteria No organisms seen  Final   Aerobic Bacterial Culture Final report  Final   Organism ID, Bacteria Routine flora  Final    Comment: Scant growth    Coagulation Studies: Recent Labs    08/28/18 1556  LABPROT 12.1  INR 0.90    Urinalysis: No results for input(s): COLORURINE, LABSPEC, PHURINE, GLUCOSEU, HGBUR, BILIRUBINUR, KETONESUR, PROTEINUR, UROBILINOGEN, NITRITE, LEUKOCYTESUR in the last 168 hours.  Invalid input(s): APPERANCEUR  Lipid Panel:    Component Value Date/Time   CHOL 257 (H) 08/29/2018 0431   TRIG 119 08/29/2018 0431   HDL 43 08/29/2018 0431   CHOLHDL 6.0 08/29/2018 0431   VLDL 24 08/29/2018 0431   LDLCALC 190 (H) 08/29/2018 0431    HgbA1C:  Lab Results  Component Value Date   HGBA1C 10.9 (H) 08/29/2018    Urine Drug Screen:  No results found for: LABOPIA, COCAINSCRNUR, LABBENZ, AMPHETMU, THCU, LABBARB  Alcohol Level: No results for input(s): ETH in the last 168 hours.  Other results: EKG: normal EKG, normal sinus rhythm, unchanged from previous tracings.  Imaging: Mr Brain Wo Contrast  Result Date: 08/29/2018 CLINICAL DATA:  Sudden onset of confusion.  Severe headache. EXAM: MRI HEAD WITHOUT CONTRAST MRA HEAD WITHOUT CONTRAST TECHNIQUE: Multiplanar, multiecho pulse sequences of the brain and surrounding structures were obtained without intravenous contrast. Angiographic images of the head were obtained using MRA technique without contrast. COMPARISON:  Head CT 08/28/2018 FINDINGS: MRI HEAD FINDINGS BRAIN: There is no acute infarct, acute hemorrhage or mass effect. The midline structures are normal. There are no old infarcts. The white  matter signal is normal for the patient's age. The CSF spaces are normal for age, with no hydrocephalus. Susceptibility-sensitive sequences show no chronic microhemorrhage or superficial siderosis. SKULL AND UPPER CERVICAL SPINE: The visualized skull base, calvarium, upper cervical spine and extracranial soft tissues are normal. SINUSES/ORBITS: No fluid levels or advanced mucosal thickening. No mastoid or middle ear effusion. The orbits are normal. MRA HEAD FINDINGS Intracranial internal carotid arteries: Normal. Anterior cerebral arteries: Normal. Middle cerebral arteries: Normal. Posterior communicating arteries: Present only on the right. Posterior cerebral arteries: Normal. Basilar artery: Normal. Vertebral arteries: Left dominant. Normal. Superior cerebellar arteries: Normal. Inferior cerebellar arteries: Normal. IMPRESSION: 1. Normal MRI of the brain. 2. Normal intracranial MRA. Electronically Signed   By: Ulyses Jarred M.D.   On: 08/29/2018 01:58   Mr Jodene Nam Head/brain ID Cm  Result Date: 08/29/2018 CLINICAL DATA:  Sudden onset of confusion.  Severe headache. EXAM: MRI HEAD WITHOUT CONTRAST MRA HEAD WITHOUT CONTRAST TECHNIQUE: Multiplanar, multiecho pulse sequences of the brain and surrounding structures were obtained without intravenous contrast. Angiographic images of the head were obtained using MRA technique without contrast. COMPARISON:  Head CT 08/28/2018 FINDINGS: MRI HEAD FINDINGS BRAIN: There is no acute infarct, acute hemorrhage or mass effect. The midline structures are normal. There are no old infarcts. The white matter signal is normal for the patient's age. The CSF spaces are normal for age, with no hydrocephalus. Susceptibility-sensitive sequences show no chronic microhemorrhage or superficial siderosis. SKULL AND UPPER CERVICAL SPINE: The visualized skull base, calvarium, upper cervical spine and extracranial soft tissues are normal. SINUSES/ORBITS: No fluid levels or advanced mucosal  thickening. No mastoid or middle ear effusion. The orbits are normal. MRA HEAD FINDINGS Intracranial internal carotid arteries: Normal. Anterior cerebral arteries: Normal. Middle cerebral arteries: Normal. Posterior communicating arteries: Present  only on the right. Posterior cerebral arteries: Normal. Basilar artery: Normal. Vertebral arteries: Left dominant. Normal. Superior cerebellar arteries: Normal. Inferior cerebellar arteries: Normal. IMPRESSION: 1. Normal MRI of the brain. 2. Normal intracranial MRA. Electronically Signed   By: Ulyses Jarred M.D.   On: 08/29/2018 01:58   Ct Head Code Stroke Wo Contrast  Result Date: 08/28/2018 CLINICAL DATA:  Code stroke. TIA. Headache blurred vision dizziness numbness both arms EXAM: CT HEAD WITHOUT CONTRAST TECHNIQUE: Contiguous axial images were obtained from the base of the skull through the vertex without intravenous contrast. COMPARISON:  CT head 12/16/2014 FINDINGS: Brain: No evidence of acute infarction, hemorrhage, hydrocephalus, extra-axial collection or mass lesion/mass effect. Vascular: Negative for hyperdense vessel Skull: Negative Sinuses/Orbits: Negative Other: None ASPECTS ([place] Stroke Program Early CT Score) - Ganglionic level infarction (caudate, lentiform nuclei, internal capsule, insula, M1-M3 cortex): 7 - Supraganglionic infarction (M4-M6 cortex): 3 Total score (0-10 with 10 being normal): 10 IMPRESSION: 1. Negative CT head 2. ASPECTS is 10 3. These results were called by telephone at the time of interpretation on 08/28/2018 at 3:54 pm to Dr. Lavonia Drafts , who verbally acknowledged these results. Electronically Signed   By: Franchot Gallo M.D.   On: 08/28/2018 15:54    Assessment: 47 y.o. male past medical history of chronic kidney disease stage II, gastroparesis, hypertension, hyperlipidemia, diabetes mellitus with diabetic neuropathy presenting to the ED from PCP office with episode of confusion, word finding difficulty, bladder vision  and bilateral upper extremity achiness.  Etiology concerning for TIA.  MRI brain reviewed and showed no acute intracranial abnormality. Hemoglobin A1c 10.9, LDL 190.  Patient states that he was not on antiplatelet or anticoagulation prior to this event.  Stroke Risk Factors - diabetes mellitus, family history, hyperlipidemia and hypertension  Plan: 1. Start therapy with Aspirin 81 mg/day with intensive management of vascular risk factor to keep systolic BP (SBP) <726 mm Hg (130 mm Hg if diabetic)  2. High potency statin with goal low density lipoprotein (LDL) <70 mg/dl 3. Carotids pending 4. Echocardiogram pending  This patient was staffed with Dr. Irish Elders, Alease Frame who personally evaluated patient, reviewed documentation and agreed with assessment and plan of care as above.  Rufina Falco, DNP, FNP-BC Board certified Nurse Practitioner Neurology Department  08/29/2018, 9:52 AM

## 2018-08-30 LAB — RPR: RPR Ser Ql: NONREACTIVE

## 2018-08-30 LAB — HIV ANTIBODY (ROUTINE TESTING W REFLEX): HIV Screen 4th Generation wRfx: NONREACTIVE

## 2018-09-04 DIAGNOSIS — E1065 Type 1 diabetes mellitus with hyperglycemia: Secondary | ICD-10-CM | POA: Diagnosis not present

## 2018-09-04 DIAGNOSIS — E109 Type 1 diabetes mellitus without complications: Secondary | ICD-10-CM | POA: Diagnosis not present

## 2018-09-05 DIAGNOSIS — Z09 Encounter for follow-up examination after completed treatment for conditions other than malignant neoplasm: Secondary | ICD-10-CM | POA: Diagnosis not present

## 2018-09-05 DIAGNOSIS — G459 Transient cerebral ischemic attack, unspecified: Secondary | ICD-10-CM | POA: Diagnosis not present

## 2018-09-05 DIAGNOSIS — I10 Essential (primary) hypertension: Secondary | ICD-10-CM | POA: Diagnosis not present

## 2018-09-15 ENCOUNTER — Ambulatory Visit: Payer: 59 | Admitting: Podiatry

## 2018-09-15 ENCOUNTER — Ambulatory Visit (INDEPENDENT_AMBULATORY_CARE_PROVIDER_SITE_OTHER): Payer: 59

## 2018-09-15 ENCOUNTER — Encounter: Payer: Self-pay | Admitting: Podiatry

## 2018-09-15 DIAGNOSIS — L97522 Non-pressure chronic ulcer of other part of left foot with fat layer exposed: Secondary | ICD-10-CM | POA: Diagnosis not present

## 2018-09-15 DIAGNOSIS — E0843 Diabetes mellitus due to underlying condition with diabetic autonomic (poly)neuropathy: Secondary | ICD-10-CM

## 2018-09-15 NOTE — Patient Instructions (Signed)
Pre-Operative Instructions  Congratulations, you have decided to take an important step towards improving your quality of life.  You can be assured that the doctors and staff at [facility] will be with you every step of the way.  Here are some important things you should know:  1. Plan to be at the surgery center/hospital at least 1 (one) hour prior to your scheduled time, unless otherwise directed by the surgical center/hospital staff.  You must have a responsible adult accompany you, remain during the surgery and drive you home.  Make sure you have directions to the surgical center/hospital to ensure you arrive on time. 2. If you are having surgery at [facility] or [facility], you will need a copy of your medical history and physical form from your family physician within one month prior to the date of surgery. We will give you a form for your primary physician to complete.  3. We make every effort to accommodate the date you request for surgery.  However, there are times where surgery dates or times have to be moved.  We will contact you as soon as possible if a change in schedule is required.   4. No aspirin/ibuprofen for one week before surgery.  If you are on aspirin, any non-steroidal anti-inflammatory medications (Mobic, Aleve, Ibuprofen) should not be taken seven (7) days prior to your surgery.  You make take Tylenol for pain prior to surgery.  5. Medications - If you are taking daily heart and blood pressure medications, seizure, reflux, allergy, asthma, anxiety, pain or diabetes medications, make sure you notify the surgery center/hospital before the day of surgery so they can tell you which medications you should take or avoid the day of surgery. 6. No food or drink after midnight the night before surgery unless directed otherwise by surgical center/hospital staff. 7. No alcoholic beverages 24-hours prior to surgery.  No smoking 24-hours prior or 24-hours after  surgery. 8. Wear loose pants or shorts. They should be loose enough to fit over bandages, boots, and casts. 9. Don't wear slip-on shoes. Sneakers are preferred. 10. Bring your boot with you to the surgery center/hospital.  Also bring crutches or a walker if your physician has prescribed it for you.  If you do not have this equipment, it will be provided for you after surgery. 11. If you have not been contacted by the surgery center/hospital by the day before your surgery, call to confirm the date and time of your surgery. 12. Leave-time from work may vary depending on the type of surgery you have.  Appropriate arrangements should be made prior to surgery with your employer. 13. Prescriptions will be provided immediately following surgery by your doctor.  Fill these as soon as possible after surgery and take the medication as directed. Pain medications will not be refilled on weekends and must be approved by the doctor. 14. Remove nail polish on the operative foot and avoid getting pedicures prior to surgery. 15. Wash the night before surgery.  The night before surgery wash the foot and leg well with water and the antibacterial soap provided. Be sure to pay special attention to beneath the toenails and in between the toes.  Wash for at least three (3) minutes. Rinse thoroughly with water and dry well with a towel.  Perform this wash unless told not to do so by your physician.  Enclosed: 1 Ice pack (please put in freezer the night before surgery)   1 Hibiclens skin cleaner     Pre-op instructions  If you have any questions regarding the instructions, please do not hesitate to call our office.  [place]: [address], [place], [place] [address] -- [phone]  [place]: [address] [place], [place] [address] -- [phone]  [place]: [address]  [place], [place] [address] -- [phone]  [place]: [address], [address], [place], [place] [address] -- [phone]  Website:  [email or web address] 

## 2018-09-19 ENCOUNTER — Telehealth: Payer: Self-pay | Admitting: *Deleted

## 2018-09-19 LAB — WOUND CULTURE: Organism ID, Bacteria: NONE SEEN

## 2018-09-19 NOTE — Telephone Encounter (Signed)
"  I was told to call and talk to [facility] about getting a surgery scheduled for Dr. Amalia Hailey in [place].  Give me a call back at your convenience.  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

## 2018-09-20 NOTE — Progress Notes (Signed)
Subjective:  47 year old male with PMHx of T1DM presenting today for follow up evaluation of an ulceration of the left hallux.  We have been treating the ulcer to the left hallux conservatively since 04/18/2017 without any progression or improvement of the patient's symptoms.  The patient has been very compliant with care and instructions at home however there is been no improvement of the wound.  Authorization request for skin graft was denied through insurance.  He presents today for further treatment evaluation and to discuss different treatment options  Past Medical History:  Diagnosis Date  . CKD (chronic kidney disease), stage II   . Gastroparesis   . Herniated cervical disc   . HTN (hypertension)   . Hypercholesteremia   . S/P cardiac cath    a. 10-15 yrs ago at [facility] due to tachycardia, reportedly normal. b. Normal ETT 03/2012.  Marland Kitchen Sinus tachycardia    a. 24-hr Holter 03/2012 - SR, occ PVCs, no VT, avg HR 96bpm.  . Type 1 diabetes mellitus ([place])    a. With insulin pump.      Objective/Physical Exam General: The patient is alert and oriented x3 in no acute distress.  Dermatology:  Wound #1 noted to the left hallux measuring 1.51.5 x 0.3 cm (LxWxD).   To the noted ulceration(s), there is no eschar. There is a moderate amount of slough, fibrin, and necrotic tissue noted. Granulation tissue and wound base is red. There is a minimal amount of serosanguineous drainage noted. There is no exposed bone muscle-tendon ligament or joint. There is no malodor. Periwound integrity is intact. Skin is warm, dry and supple bilateral lower extremities.  Vascular: Palpable pedal pulses bilaterally.  There is some moderate erythema and edema noted to the affected toe concerning for a toe cellulitis.  Capillary refill within normal limits.  Neurological: Epicritic and protective threshold diminished bilaterally.   Musculoskeletal Exam: Range of motion within normal limits to all pedal and ankle joints  bilateral. Muscle strength 5/5 in all groups bilateral.   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 #2 medically necessary excisional debridement including subcutaneous tissue was performed using a tissue nipper and a chisel blade. Excisional debridement of all the necrotic nonviable tissue down to healthy bleeding viable tissue was performed with post-debridement measurements same as pre-. #3 the wound was cleansed and dry sterile dressing applied. #4  Today we are going to proceed with aggressive management of the ulceration including surgical intervention.  Today I discussed that by performing an exostectomy to the plantar portion of the osseous structures of the left hallux it should offload pressure from the overlying ulcer.  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 I discussed the benefits and risks relating to the surgery.  Patient agrees with proceeding with surgery.  #5 authorization for surgery initiated today.  Surgery will consist of exostectomy left hallux.  Debridement of ulcer left hallux.  Possible application of Acell wound matrix left hallux.  #6 continue wearing postoperative shoe #7 recommend Peg Assist offloading insole.  Patient says that he will purchase it online #8 continue Aquasol AG or gentamicin cream daily #9 return to clinic 1 week postop  Edrick Kins, DPM [facility]  Dr. Edrick Kins, DPM  [address]                                        [place], [place] [address]                Office [phone]  Fax [phone]

## 2018-09-21 NOTE — Telephone Encounter (Signed)
"  I am calling to schedule my surgery with Dr. Amalia Hailey.  I have called several times to get you."  Do you have a date in mind?  Dr. Amalia Hailey does surgeries on Thursdays.  "What's the soonest date you have?"  He can do it 09/28/18.  "I can't do it on December 26.  I am going out of town with my family.  I was thinking more like January 3 or 23."  He can't do it on either of those days.  His next available will be February 13.  "Okay put me down for then.  Is there anything I need to do?" You need to register online with the surgery center, instructions are in the brochure that we gave you.  You cannot eat or drink anything after midnight.  Someone from the surgical center will call you a day or two prior to your surgery date and they will give you your arrival time.  "Okay, thank you."

## 2018-09-28 DIAGNOSIS — H524 Presbyopia: Secondary | ICD-10-CM | POA: Diagnosis not present

## 2018-09-28 DIAGNOSIS — E113393 Type 2 diabetes mellitus with moderate nonproliferative diabetic retinopathy without macular edema, bilateral: Secondary | ICD-10-CM | POA: Diagnosis not present

## 2018-10-06 ENCOUNTER — Encounter: Payer: Self-pay | Admitting: Podiatry

## 2018-10-06 ENCOUNTER — Ambulatory Visit: Payer: 59 | Admitting: Podiatry

## 2018-10-06 DIAGNOSIS — L97522 Non-pressure chronic ulcer of other part of left foot with fat layer exposed: Secondary | ICD-10-CM | POA: Diagnosis not present

## 2018-10-06 DIAGNOSIS — E0843 Diabetes mellitus due to underlying condition with diabetic autonomic (poly)neuropathy: Secondary | ICD-10-CM

## 2018-10-06 DIAGNOSIS — I70245 Atherosclerosis of native arteries of left leg with ulceration of other part of foot: Secondary | ICD-10-CM

## 2018-10-06 MED ORDER — DOXYCYCLINE HYCLATE 100 MG PO TABS: 100.0000 mg | ORAL_TABLET | Freq: Two times a day (BID) | ORAL | 2 refills | Status: DC

## 2018-10-10 NOTE — Progress Notes (Signed)
   Subjective:  48 year old male with PMHx of T1DM presenting today for follow up evaluation of an ulceration of the left hallux.  We have been treating the ulcer to the left hallux conservatively since 04/18/2017 without any progression or improvement of the patient's symptoms.  Patient is unsure if there is been any improvement since last visit.  He does have some pain that comes and goes over the last few days.  He is also noticed some increased redness and swelling to the toe.  Past Medical History:  Diagnosis Date  . CKD (chronic kidney disease), stage II   . Gastroparesis   . Herniated cervical disc   . HTN (hypertension)   . Hypercholesteremia   . S/P cardiac cath    a. 10-15 yrs ago at [facility] due to tachycardia, reportedly normal. b. Normal ETT 03/2012.  Marland Kitchen Sinus tachycardia    a. 24-hr Holter 03/2012 - SR, occ PVCs, no VT, avg HR 96bpm.  . Type 1 diabetes mellitus ([place])    a. With insulin pump.      Objective/Physical Exam General: The patient is alert and oriented x3 in no acute distress.  Dermatology:  Wound #1 noted to the left hallux measuring 1.51.5 x 0.3 cm (LxWxD).   To the noted ulceration(s), there is no eschar. There is a moderate amount of slough, fibrin, and necrotic tissue noted. Granulation tissue and wound base is red. There is a minimal amount of serosanguineous drainage noted. There is no exposed bone muscle-tendon ligament or joint. There is no malodor. Periwound integrity is intact. Skin is warm, dry and supple bilateral lower extremities.  Vascular: Palpable pedal pulses bilaterally.  There is some moderate erythema and edema noted to the affected toe concerning for a toe cellulitis.  Capillary refill within normal limits.  Neurological: Epicritic and protective threshold diminished bilaterally.   Musculoskeletal Exam: Range of motion within normal limits to all pedal and ankle joints bilateral. Muscle strength 5/5 in all groups bilateral.   Assessment: #1  ulceration of the left hallux secondary to diabetes mellitus #2 diabetes mellitus w/ peripheral neuropathy #3 cellulitis left hallux-improved   Plan of Care:  #1 Patient was evaluated. #2 medically necessary excisional debridement including subcutaneous tissue was performed using a tissue nipper and a chisel blade. Excisional debridement of all the necrotic nonviable tissue down to healthy bleeding viable tissue was performed with post-debridement measurements same as pre-. #3 the wound was cleansed and dry sterile dressing applied. #4 continue Aquasol AG or gentamicin cream daily #5 patient is scheduled for surgical intervention on 11/16/2018.  In the meantime continue weightbearing in the postoperative shoe. #6 prescription for doxycycline 100 mg #28 with 1 refill.  Recommend that the patient take the oral antibiotics up until surgery.  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 #7 return to clinic 1 week postop  Edrick Kins, DPM [facility]  Dr. Edrick Kins, [address]                                        [place], [place] [address]                Office [phone]  Fax [phone]

## 2018-10-24 ENCOUNTER — Ambulatory Visit: Payer: 59 | Admitting: Podiatry

## 2018-10-24 ENCOUNTER — Encounter: Payer: Self-pay | Admitting: Podiatry

## 2018-10-24 DIAGNOSIS — E0843 Diabetes mellitus due to underlying condition with diabetic autonomic (poly)neuropathy: Secondary | ICD-10-CM | POA: Diagnosis not present

## 2018-10-24 DIAGNOSIS — L97522 Non-pressure chronic ulcer of other part of left foot with fat layer exposed: Secondary | ICD-10-CM

## 2018-10-31 NOTE — Progress Notes (Signed)
   Subjective:  48 year old male with PMHx of T1DM presenting today for follow up evaluation of an ulceration of the left hallux. He states he the wound is improving but he is concerned about a blister that appeared at the base of the toenail. He reports associated drainage. He believes the bandage may have irritated the wound. He has been using the post op shoe and changing his bandage daily as directed. Patient is here for further evaluation and treatment.   Past Medical History:  Diagnosis Date  . CKD (chronic kidney disease), stage II   . Gastroparesis   . Herniated cervical disc   . HTN (hypertension)   . Hypercholesteremia   . S/P cardiac cath    a. 10-15 yrs ago at [facility] due to tachycardia, reportedly normal. b. Normal ETT 03/2012.  Marland Kitchen Sinus tachycardia    a. 24-hr Holter 03/2012 - SR, occ PVCs, no VT, avg HR 96bpm.  . Type 1 diabetes mellitus ([place])    a. With insulin pump.      Objective/Physical Exam General: The patient is alert and oriented x3 in no acute distress.  Dermatology:  Wound #1 noted to the left hallux measuring 0.9 x 0.9 x 0.2 cm (LxWxD).   To the noted ulceration(s), there is no eschar. There is a moderate amount of slough, fibrin, and necrotic tissue noted. Granulation tissue and wound base is red. There is a minimal amount of serosanguineous drainage noted. There is no exposed bone muscle-tendon ligament or joint. There is no malodor. Periwound integrity is intact. Skin is warm, dry and supple bilateral lower extremities.  Vascular: Palpable pedal pulses bilaterally.  There is some moderate erythema and edema noted to the affected toe concerning for a toe cellulitis.  Capillary refill within normal limits.  Neurological: Epicritic and protective threshold diminished bilaterally.   Musculoskeletal Exam: Range of motion within normal limits to all pedal and ankle joints bilateral. Muscle strength 5/5 in all groups bilateral.   Assessment: #1 ulceration of the  left hallux secondary to diabetes mellitus #2 diabetes mellitus w/ peripheral neuropathy #3 cellulitis left hallux-improved   Plan of Care:  #1 Patient was evaluated. #2 medically necessary excisional debridement including subcutaneous tissue was performed using a tissue nipper and a chisel blade. Excisional debridement of all the necrotic nonviable tissue down to healthy bleeding viable tissue was performed with post-debridement measurements same as pre-. #3 the wound was cleansed and dry sterile dressing applied. #4 Continue applying dry sterile dressing daily.  #5 Continue using the post op shoe as directed.  #6 Patient scheduled for surgery on 11/16/2018.  #7 Return to clinic one week post op.   Edrick Kins, DPM [facility]  Dr. Edrick Kins, [address]                                        [place], [place] [address]                Office [phone]  Fax [phone]

## 2018-11-16 ENCOUNTER — Encounter: Payer: Self-pay | Admitting: Podiatry

## 2018-11-16 ENCOUNTER — Other Ambulatory Visit: Payer: Self-pay | Admitting: Podiatry

## 2018-11-16 DIAGNOSIS — L97522 Non-pressure chronic ulcer of other part of left foot with fat layer exposed: Secondary | ICD-10-CM | POA: Diagnosis not present

## 2018-11-16 DIAGNOSIS — L89893 Pressure ulcer of other site, stage 3: Secondary | ICD-10-CM | POA: Diagnosis not present

## 2018-11-16 DIAGNOSIS — E7800 Pure hypercholesterolemia, unspecified: Secondary | ICD-10-CM | POA: Diagnosis not present

## 2018-11-16 DIAGNOSIS — D1632 Benign neoplasm of short bones of left lower limb: Secondary | ICD-10-CM | POA: Diagnosis not present

## 2018-11-16 DIAGNOSIS — M25775 Osteophyte, left foot: Secondary | ICD-10-CM | POA: Diagnosis not present

## 2018-11-16 DIAGNOSIS — L97529 Non-pressure chronic ulcer of other part of left foot with unspecified severity: Secondary | ICD-10-CM | POA: Diagnosis not present

## 2018-11-16 NOTE — Progress Notes (Signed)
.  postop

## 2018-11-24 ENCOUNTER — Ambulatory Visit (INDEPENDENT_AMBULATORY_CARE_PROVIDER_SITE_OTHER): Payer: 59

## 2018-11-24 ENCOUNTER — Ambulatory Visit (INDEPENDENT_AMBULATORY_CARE_PROVIDER_SITE_OTHER): Payer: 59 | Admitting: Podiatry

## 2018-11-24 DIAGNOSIS — L97522 Non-pressure chronic ulcer of other part of left foot with fat layer exposed: Secondary | ICD-10-CM

## 2018-11-24 DIAGNOSIS — Z9889 Other specified postprocedural states: Secondary | ICD-10-CM

## 2018-11-24 DIAGNOSIS — E0843 Diabetes mellitus due to underlying condition with diabetic autonomic (poly)neuropathy: Secondary | ICD-10-CM | POA: Diagnosis not present

## 2018-11-27 NOTE — Progress Notes (Signed)
   Subjective:  Patient presents today status post exostectomy left hallux. DOS: 11/16/2018. He states he is doing well. He reports his pain has been tolerable without having to take pain medication. He reports some tenderness to the incision site that is caused by touching it. He has been using the post op shoe as directed. Patient is here for further evaluation and treatment.    Past Medical History:  Diagnosis Date  . CKD (chronic kidney disease), stage II   . Gastroparesis   . Herniated cervical disc   . HTN (hypertension)   . Hypercholesteremia   . S/P cardiac cath    a. 10-15 yrs ago at [facility] due to tachycardia, reportedly normal. b. Normal ETT 03/2012.  Marland Kitchen Sinus tachycardia    a. 24-hr Holter 03/2012 - SR, occ PVCs, no VT, avg HR 96bpm.  . Type 1 diabetes mellitus ([place])    a. With insulin pump.      Objective/Physical Exam Neurovascular status intact.  Skin incisions appear to be well coapted with sutures and staples intact. No sign of infectious process noted. No dehiscence. No active bleeding noted. Moderate edema noted to the surgical extremity.  Wound #1 noted to the left hallux measuring 0.4 x 0.4 x 0.2 cm.   To the above-noted ulceration, there is no eschar. There is a moderate amount of slough, fibrin and necrotic tissue. Granulation tissue and wound base is red. There is no malodor. There is a minimal amount of serosanginous drainage noted. Periwound integrity is intact.   Radiographic Exam:  Orthopedic hardware and osteotomies sites appear to be stable with routine healing.  Assessment: 1. s/p exostectomy left hallux. DOS: 11/16/2018 2. Ulceration of the left hallux secondary to diabetes mellitus    Plan of Care:  1. Patient was evaluated. X-rays reviewed 2. Medically necessary excisional debridement including subcutaneous tissue was performed using a tissue nipper and a chisel blade. Excisional debridement of all the necrotic nonviable tissue down to healthy bleeding  viable tissue was performed with post-debridement measurements same as pre-. 3. The wound was cleansed and dry sterile dressing applied. 4. Resume using silver wound gel every other day with a dry sterile dressing.  5. Continue weightbearing in post op shoe.  6. Return to clinic in one week.     Edrick Kins, DPM [facility]  Dr. Edrick Kins, [address]                                        [place], [place] [address]                Office [phone]  Fax [phone]

## 2018-11-28 ENCOUNTER — Ambulatory Visit (INDEPENDENT_AMBULATORY_CARE_PROVIDER_SITE_OTHER): Payer: 59 | Admitting: Podiatry

## 2018-11-28 ENCOUNTER — Encounter: Payer: Self-pay | Admitting: Podiatry

## 2018-11-28 DIAGNOSIS — E0843 Diabetes mellitus due to underlying condition with diabetic autonomic (poly)neuropathy: Secondary | ICD-10-CM

## 2018-11-28 DIAGNOSIS — Z9889 Other specified postprocedural states: Secondary | ICD-10-CM

## 2018-11-28 DIAGNOSIS — L97522 Non-pressure chronic ulcer of other part of left foot with fat layer exposed: Secondary | ICD-10-CM

## 2018-11-28 MED ORDER — SULFAMETHOXAZOLE-TRIMETHOPRIM 800-160 MG PO TABS: 1.0000 | ORAL_TABLET | Freq: Two times a day (BID) | ORAL | 0 refills | Status: DC

## 2018-11-29 NOTE — Progress Notes (Signed)
   Subjective:  Patient presents today status post exostectomy left hallux. DOS: 11/16/2018. He reports swelling of the foot. He states a suture came out. He reports associated redness and swelling around the incision. He is currently taking Doxycycline as directed. There are no modifying factors noted. Patient is here for further evaluation and treatment.    Past Medical History:  Diagnosis Date  . CKD (chronic kidney disease), stage II   . Gastroparesis   . Herniated cervical disc   . HTN (hypertension)   . Hypercholesteremia   . S/P cardiac cath    a. 10-15 yrs ago at [facility] due to tachycardia, reportedly normal. b. Normal ETT 03/2012.  Marland Kitchen Sinus tachycardia    a. 24-hr Holter 03/2012 - SR, occ PVCs, no VT, avg HR 96bpm.  . Type 1 diabetes mellitus ([place])    a. With insulin pump.      Objective/Physical Exam Neurovascular status intact. Increased erythema and edema localized to the left hallux. Maceration noted to the incision site.   Wound #1 noted to the left hallux measuring 0.4 x 0.4 x 0.2 cm.   To the above-noted ulceration, there is no eschar. There is a moderate amount of slough, fibrin and necrotic tissue. Granulation tissue and wound base is red. There is no malodor. There is a minimal amount of serosanginous drainage noted. Periwound integrity is intact.   Assessment: 1. s/p exostectomy left hallux. DOS: 11/16/2018 2. Ulceration of the left hallux secondary to diabetes mellitus  3. Mild cellulitis left hallux    Plan of Care:  1. Patient was evaluated.  2. Prescription for Bactrim DS provided to patient.  3. Continue dressing changes with silver hydrogel and dry sterile dressing.  4. Continue using post op shoe.  5. Return to clinic in one week.     Edrick Kins, DPM [facility]  Dr. Edrick Kins, [address]                                        [place], [place] [address]                Office [phone]  Fax [phone]

## 2018-12-05 ENCOUNTER — Ambulatory Visit (INDEPENDENT_AMBULATORY_CARE_PROVIDER_SITE_OTHER): Payer: 59

## 2018-12-05 ENCOUNTER — Ambulatory Visit (INDEPENDENT_AMBULATORY_CARE_PROVIDER_SITE_OTHER): Payer: Self-pay | Admitting: Podiatry

## 2018-12-05 DIAGNOSIS — L97522 Non-pressure chronic ulcer of other part of left foot with fat layer exposed: Secondary | ICD-10-CM

## 2018-12-05 DIAGNOSIS — Z9889 Other specified postprocedural states: Secondary | ICD-10-CM

## 2018-12-06 DIAGNOSIS — E109 Type 1 diabetes mellitus without complications: Secondary | ICD-10-CM | POA: Diagnosis not present

## 2018-12-06 DIAGNOSIS — E1065 Type 1 diabetes mellitus with hyperglycemia: Secondary | ICD-10-CM | POA: Diagnosis not present

## 2018-12-06 NOTE — Progress Notes (Signed)
   Subjective:  Patient presents today status post exostectomy left hallux. DOS: 11/16/2018. He states he has improved significantly since his last visit. He states the wounds are healing appropriately. He has finished taking the Doxycycline as directed. There are no modifying factors noted. Patient is here for further evaluation and treatment.    Past Medical History:  Diagnosis Date  . CKD (chronic kidney disease), stage II   . Gastroparesis   . Herniated cervical disc   . HTN (hypertension)   . Hypercholesteremia   . S/P cardiac cath    a. 10-15 yrs ago at [facility] due [facility] tachycardia, reportedly normal. b. Normal ETT 03/2012.  Marland Kitchen Sinus tachycardia    a. 24-hr Holter 03/2012 - SR, occ PVCs, no VT, avg HR 96bpm.  . Type 1 diabetes mellitus ([place])    a. With insulin pump.      Objective/Physical Exam Neurovascular status intact. Maceration noted to the incision site.   Wound #1 noted to the left hallux measuring 0.2x 0.2 x 0.1 cm.   To the above-noted ulceration, there is no eschar. There is a moderate amount of slough, fibrin and necrotic tissue. Granulation tissue and wound base is red. There is no malodor. There is a minimal amount of serosanginous drainage noted. Periwound integrity is intact.  Radiographic Exam:  Orthopedic hardware and osteotomies sites appear to be stable with routine healing.   Assessment: 1. s/p exostectomy left hallux. DOS: 11/16/2018 2. Ulceration of the left hallux secondary to diabetes mellitus - improved  3. Mild cellulitis left hallux - resolved    Plan of Care:  1. Patient was evaluated. X-Rays reviewed.  2. Continue applying dry sterile dressing daily.  3. Continue using post op shoe for one week.  4. Sutures removed.  5. Return to clinic in one week.      Edrick Kins, DPM [facility]  Dr. Edrick Kins, [address]                                        [place], [place] [address]                Office [phone]  Fax [phone]

## 2018-12-12 ENCOUNTER — Ambulatory Visit (INDEPENDENT_AMBULATORY_CARE_PROVIDER_SITE_OTHER): Payer: 59

## 2018-12-12 ENCOUNTER — Encounter: Payer: Self-pay | Admitting: Podiatry

## 2018-12-12 ENCOUNTER — Ambulatory Visit (INDEPENDENT_AMBULATORY_CARE_PROVIDER_SITE_OTHER): Payer: 59 | Admitting: Podiatry

## 2018-12-12 DIAGNOSIS — L97522 Non-pressure chronic ulcer of other part of left foot with fat layer exposed: Secondary | ICD-10-CM | POA: Diagnosis not present

## 2018-12-12 DIAGNOSIS — Z9889 Other specified postprocedural states: Secondary | ICD-10-CM

## 2018-12-13 ENCOUNTER — Ambulatory Visit (INDEPENDENT_AMBULATORY_CARE_PROVIDER_SITE_OTHER): Payer: 59 | Admitting: Orthotics

## 2018-12-13 ENCOUNTER — Other Ambulatory Visit: Payer: Self-pay

## 2018-12-13 DIAGNOSIS — M7751 Other enthesopathy of right foot: Secondary | ICD-10-CM

## 2018-12-13 DIAGNOSIS — Z9889 Other specified postprocedural states: Secondary | ICD-10-CM

## 2018-12-13 DIAGNOSIS — L97522 Non-pressure chronic ulcer of other part of left foot with fat layer exposed: Secondary | ICD-10-CM

## 2018-12-13 DIAGNOSIS — E0843 Diabetes mellitus due to underlying condition with diabetic autonomic (poly)neuropathy: Secondary | ICD-10-CM | POA: Diagnosis not present

## 2018-12-13 NOTE — Progress Notes (Signed)
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 Upon recommendation of Dr. Ellard Artis  Patient presents with DM2, diabetic ulceration hallux Left Goals are accommodative diabetic w/ pcell cover.  Plan vendor [facility]

## 2018-12-15 NOTE — Progress Notes (Signed)
   Subjective:  Patient presents today status post exostectomy left hallux. DOS: 11/16/2018. He states he is doing well. He reports some redness around the incision site. He has been using the post op shoe as directed. He denies modifying factors. Patient is here for further evaluation and treatment.    Past Medical History:  Diagnosis Date  . CKD (chronic kidney disease), stage II   . Gastroparesis   . Herniated cervical disc   . HTN (hypertension)   . Hypercholesteremia   . S/P cardiac cath    a. 10-15 yrs ago at [facility] due [facility], reportedly normal. b. Normal ETT 03/2012.  Marland Kitchen Sinus tachycardia    a. 24-hr Holter 03/2012 - SR, occ PVCs, no VT, avg HR 96bpm.  . Type 1 diabetes mellitus ([place])    a. With insulin pump.      Objective/Physical Exam Neurovascular status intact. Maceration noted to the incision site has improved significantly.   Wound #1 noted to the left hallux measuring 0.1 x 0.2 x 0.1 cm.   To the above-noted ulceration, there is no eschar. There is a moderate amount of slough, fibrin and necrotic tissue. Granulation tissue and wound base is red. There is no malodor. There is a minimal amount of serosanginous drainage noted. Periwound integrity is intact.  Radiographic Exam:  Orthopedic hardware and osteotomies sites appear to be stable with routine healing.   Assessment: 1. s/p exostectomy left hallux. DOS: 11/16/2018 2. Ulceration of the left hallux secondary to diabetes mellitus - improved  3. Mild cellulitis left hallux - resolved    Plan of Care:  1. Patient was evaluated. X-Rays reviewed.  2. Dressing changed.  3. Continue using post op shoe.  4. Appointment with Liliane Channel, Pedorthist, for DM shoes and insoles. 5. Return to clinic in 2 weeks.    Edrick Kins, DPM [facility]  Dr. Edrick Kins, [address]                                        [place], [place] [address]                Office [phone]  Fax [phone]

## 2018-12-26 ENCOUNTER — Encounter: Payer: Self-pay | Admitting: Podiatry

## 2018-12-26 ENCOUNTER — Ambulatory Visit (INDEPENDENT_AMBULATORY_CARE_PROVIDER_SITE_OTHER): Payer: 59 | Admitting: Podiatry

## 2018-12-26 ENCOUNTER — Other Ambulatory Visit: Payer: Self-pay

## 2018-12-26 DIAGNOSIS — E0843 Diabetes mellitus due to underlying condition with diabetic autonomic (poly)neuropathy: Secondary | ICD-10-CM

## 2018-12-26 DIAGNOSIS — L97522 Non-pressure chronic ulcer of other part of left foot with fat layer exposed: Secondary | ICD-10-CM

## 2018-12-26 DIAGNOSIS — Z9889 Other specified postprocedural states: Secondary | ICD-10-CM

## 2018-12-27 NOTE — Progress Notes (Signed)
   Subjective:  Patient presents today status post exostectomy left hallux. DOS: 11/16/2018. He states he is doing well. He expresses concern about some clear drainage that occurred yesterday but has since resolved. He reports some mild associated swelling. He has been using the post op shoe as directed. He denies any modifying factors. Patient is here for further evaluation and treatment.    Past Medical History:  Diagnosis Date  . CKD (chronic kidney disease), stage II   . Gastroparesis   . Herniated cervical disc   . HTN (hypertension)   . Hypercholesteremia   . S/P cardiac cath    a. 10-15 yrs ago at [facility] due [facility], reportedly normal. b. Normal ETT 03/2012.  Marland Kitchen Sinus tachycardia    a. 24-hr Holter 03/2012 - SR, occ PVCs, no VT, avg HR 96bpm.  . Type 1 diabetes mellitus ([place])    a. With insulin pump.      Objective/Physical Exam Neurovascular status intact. Maceration noted to the incision site has improved significantly. Erythema and edema localized to the left hallux.   Wound noted to the left hallux has healed. Complete re-epithelialization has occurred. No drainage noted.   Assessment: 1. s/p exostectomy left hallux. DOS: 11/16/2018 2. Ulceration of the left hallux secondary to diabetes mellitus - healed 3. Mild cellulitis left hallux - recurrent    Plan of Care:  1. Patient was evaluated.  2. Resume taking Doxycycline 100 mg BID #20.  3. Recommended using Betadine daily with a bandage.  4. Continue wearing DM shoes and insoles.  5. Return to clinic in 2 weeks.    Edrick Kins, DPM [facility]  Dr. Edrick Kins, [address]                                        [place], [place] [address]                Office [phone]  Fax [phone]

## 2019-01-03 ENCOUNTER — Other Ambulatory Visit: Payer: 59 | Admitting: Orthotics

## 2019-01-09 ENCOUNTER — Encounter: Payer: Self-pay | Admitting: Podiatry

## 2019-01-09 ENCOUNTER — Ambulatory Visit (INDEPENDENT_AMBULATORY_CARE_PROVIDER_SITE_OTHER): Payer: 59 | Admitting: Podiatry

## 2019-01-09 ENCOUNTER — Other Ambulatory Visit: Payer: Self-pay

## 2019-01-09 VITALS — Temp 97.8°F

## 2019-01-09 DIAGNOSIS — I70245 Atherosclerosis of native arteries of left leg with ulceration of other part of foot: Secondary | ICD-10-CM

## 2019-01-09 DIAGNOSIS — Z9889 Other specified postprocedural states: Secondary | ICD-10-CM

## 2019-01-09 DIAGNOSIS — E0843 Diabetes mellitus due to underlying condition with diabetic autonomic (poly)neuropathy: Secondary | ICD-10-CM

## 2019-01-09 DIAGNOSIS — L97522 Non-pressure chronic ulcer of other part of left foot with fat layer exposed: Secondary | ICD-10-CM

## 2019-01-09 NOTE — Progress Notes (Signed)
   Subjective:  Patient presents today status post exostectomy left hallux. DOS: 11/16/2018.  Patient says that although the wounds have basically healed he continues to experience some swelling and redness to the great toe.  Otherwise he has no new complaints at this time.  He is weightbearing and walking in his diabetic shoes with insoles.  Past Medical History:  Diagnosis Date  . CKD (chronic kidney disease), stage II   . Gastroparesis   . Herniated cervical disc   . HTN (hypertension)   . Hypercholesteremia   . S/P cardiac cath    a. 10-15 yrs ago at [facility] due [facility] tachycardia, reportedly normal. b. Normal ETT 03/2012.  Marland Kitchen Sinus tachycardia    a. 24-hr Holter 03/2012 - SR, occ PVCs, no VT, avg HR 96bpm.  . Type 1 diabetes mellitus ([place])    a. With insulin pump.      Objective/Physical Exam Neurovascular status intact.  Epicritic and protective threshold diminished.  Both the plantar ulceration and the lateral incision site have healed completely.  There is some hyperkeratotic callus tissue noted.  No drainage..  Mild erythema and edema localized to the left hallux.   Assessment: 1. s/p exostectomy left hallux. DOS: 11/16/2018 2. Ulceration of the left hallux secondary to diabetes mellitus - healed 3. Mild edema left great toe   Plan of Care:  1. Patient was evaluated.  2.  Continue weightbearing in the diabetic shoes with insoles 3.  Excisional debridement of the hyperkeratotic callus tissue was performed using a chisel blade without incident or bleeding 4.  The patient no longer needs to apply any wraps or dressings to the great toe 5.  Today we note extend the patient's short-term disability for an additional 4 weeks.  This will allow the patient to transition into good supportive shoes.  Scheduled return to work will be 02/12/2019.  Medically necessary 5.  Return to clinic in 4 weeks  Edrick Kins, DPM [facility]  Dr. Edrick Kins, [address]                                         [place], [place] [address]                Office [phone]  Fax [phone]

## 2019-01-22 DIAGNOSIS — E1022 Type 1 diabetes mellitus with diabetic chronic kidney disease: Secondary | ICD-10-CM | POA: Diagnosis not present

## 2019-01-22 DIAGNOSIS — E103293 Type 1 diabetes mellitus with mild nonproliferative diabetic retinopathy without macular edema, bilateral: Secondary | ICD-10-CM | POA: Diagnosis not present

## 2019-01-22 DIAGNOSIS — E1021 Type 1 diabetes mellitus with diabetic nephropathy: Secondary | ICD-10-CM | POA: Diagnosis not present

## 2019-01-22 DIAGNOSIS — Z9641 Presence of insulin pump (external) (internal): Secondary | ICD-10-CM | POA: Insufficient documentation

## 2019-01-22 DIAGNOSIS — E1040 Type 1 diabetes mellitus with diabetic neuropathy, unspecified: Secondary | ICD-10-CM | POA: Diagnosis not present

## 2019-01-26 DIAGNOSIS — E039 Hypothyroidism, unspecified: Secondary | ICD-10-CM | POA: Insufficient documentation

## 2019-01-26 DIAGNOSIS — E1021 Type 1 diabetes mellitus with diabetic nephropathy: Secondary | ICD-10-CM | POA: Diagnosis not present

## 2019-01-26 DIAGNOSIS — E1040 Type 1 diabetes mellitus with diabetic neuropathy, unspecified: Secondary | ICD-10-CM | POA: Diagnosis not present

## 2019-01-26 DIAGNOSIS — E1022 Type 1 diabetes mellitus with diabetic chronic kidney disease: Secondary | ICD-10-CM | POA: Diagnosis not present

## 2019-01-26 DIAGNOSIS — E103293 Type 1 diabetes mellitus with mild nonproliferative diabetic retinopathy without macular edema, bilateral: Secondary | ICD-10-CM | POA: Diagnosis not present

## 2019-02-03 ENCOUNTER — Emergency Department
Admission: EM | Admit: 2019-02-03 | Discharge: 2019-02-03 | Disposition: A | Discharge status: Home / Self Care | Payer: 59 | Attending: Emergency Medicine | Admitting: Emergency Medicine

## 2019-02-03 ENCOUNTER — Emergency Department: Payer: 59

## 2019-02-03 ENCOUNTER — Encounter: Payer: Self-pay | Admitting: Emergency Medicine

## 2019-02-03 ENCOUNTER — Other Ambulatory Visit: Payer: Self-pay

## 2019-02-03 DIAGNOSIS — Z03818 Encounter for observation for suspected exposure to other biological agents ruled out: Secondary | ICD-10-CM | POA: Diagnosis not present

## 2019-02-03 DIAGNOSIS — I129 Hypertensive chronic kidney disease with stage 1 through stage 4 chronic kidney disease, or unspecified chronic kidney disease: Secondary | ICD-10-CM | POA: Insufficient documentation

## 2019-02-03 DIAGNOSIS — Z79899 Other long term (current) drug therapy: Secondary | ICD-10-CM | POA: Diagnosis not present

## 2019-02-03 DIAGNOSIS — N183 Chronic kidney disease, stage 3 (moderate): Secondary | ICD-10-CM | POA: Insufficient documentation

## 2019-02-03 DIAGNOSIS — Z7982 Long term (current) use of aspirin: Secondary | ICD-10-CM | POA: Insufficient documentation

## 2019-02-03 DIAGNOSIS — Z20828 Contact with and (suspected) exposure to other viral communicable diseases: Secondary | ICD-10-CM | POA: Insufficient documentation

## 2019-02-03 DIAGNOSIS — Z794 Long term (current) use of insulin: Secondary | ICD-10-CM | POA: Insufficient documentation

## 2019-02-03 DIAGNOSIS — J111 Influenza due to unidentified influenza virus with other respiratory manifestations: Secondary | ICD-10-CM | POA: Insufficient documentation

## 2019-02-03 DIAGNOSIS — E1065 Type 1 diabetes mellitus with hyperglycemia: Secondary | ICD-10-CM | POA: Insufficient documentation

## 2019-02-03 DIAGNOSIS — W57XXXA Bitten or stung by nonvenomous insect and other nonvenomous arthropods, initial encounter: Secondary | ICD-10-CM | POA: Insufficient documentation

## 2019-02-03 DIAGNOSIS — R1110 Vomiting, unspecified: Secondary | ICD-10-CM | POA: Diagnosis not present

## 2019-02-03 DIAGNOSIS — R05 Cough: Secondary | ICD-10-CM | POA: Diagnosis not present

## 2019-02-03 DIAGNOSIS — R6889 Other general symptoms and signs: Secondary | ICD-10-CM

## 2019-02-03 DIAGNOSIS — R6883 Chills (without fever): Secondary | ICD-10-CM | POA: Diagnosis present

## 2019-02-03 DIAGNOSIS — R509 Fever, unspecified: Secondary | ICD-10-CM | POA: Diagnosis not present

## 2019-02-03 LAB — CBC WITH DIFFERENTIAL/PLATELET
Abs Immature Granulocytes: 0.03 10*3/uL (ref 0.00–0.07)
Basophils Absolute: 0 10*3/uL (ref 0.0–0.1)
Basophils Relative: 1 %
Eosinophils Absolute: 0.1 10*3/uL (ref 0.0–0.5)
Eosinophils Relative: 3 %
HCT: 41.5 % (ref 39.0–52.0)
Hemoglobin: 14 g/dL (ref 13.0–17.0)
Immature Granulocytes: 1 %
Lymphocytes Relative: 16 %
Lymphs Abs: 0.6 10*3/uL — ABNORMAL LOW (ref 0.7–4.0)
MCH: 28.5 pg (ref 26.0–34.0)
MCHC: 33.7 g/dL (ref 30.0–36.0)
MCV: 84.5 fL (ref 80.0–100.0)
Monocytes Absolute: 0.5 10*3/uL (ref 0.1–1.0)
Monocytes Relative: 12 %
Neutro Abs: 2.7 10*3/uL (ref 1.7–7.7)
Neutrophils Relative %: 67 %
Platelets: 190 10*3/uL (ref 150–400)
RBC: 4.91 MIL/uL (ref 4.22–5.81)
RDW: 13.2 % (ref 11.5–15.5)
WBC: 4 10*3/uL (ref 4.0–10.5)
nRBC: 0 % (ref 0.0–0.2)

## 2019-02-03 LAB — BASIC METABOLIC PANEL
Anion gap: 7 (ref 5–15)
BUN: 23 mg/dL — ABNORMAL HIGH (ref 6–20)
CO2: 24 mmol/L (ref 22–32)
Calcium: 8.3 mg/dL — ABNORMAL LOW (ref 8.9–10.3)
Chloride: 101 mmol/L (ref 98–111)
Creatinine, Ser: 2.42 mg/dL — ABNORMAL HIGH (ref 0.61–1.24)
GFR calc Af Amer: 36 mL/min — ABNORMAL LOW (ref >60)
GFR calc non Af Amer: 31 mL/min — ABNORMAL LOW (ref >60)
Glucose, Bld: 177 mg/dL — ABNORMAL HIGH (ref 70–99)
Potassium: 4.3 mmol/L (ref 3.5–5.1)
Sodium: 132 mmol/L — ABNORMAL LOW (ref 135–145)

## 2019-02-03 LAB — LACTIC ACID, PLASMA: Lactic Acid, Venous: 1.1 mmol/L (ref 0.5–1.9)

## 2019-02-03 LAB — SARS CORONAVIRUS 2 BY RT PCR (HOSPITAL ORDER, PERFORMED IN ~~LOC~~ HOSPITAL LAB): SARS Coronavirus 2: NEGATIVE

## 2019-02-03 MED ORDER — SODIUM CHLORIDE 0.9 % IV BOLUS
500.0000 mL | Freq: Once | INTRAVENOUS | Status: AC
  Administered 2019-02-03: 500 mL via INTRAVENOUS

## 2019-02-03 MED ORDER — ONDANSETRON HCL 4 MG/2ML IJ SOLN
4.0000 mg | Freq: Once | INTRAMUSCULAR | Status: AC
  Administered 2019-02-03: 15:00:00 4 mg via INTRAVENOUS
  Filled 2019-02-03: qty 2

## 2019-02-03 MED ORDER — ACETAMINOPHEN 325 MG PO TABS
650.0000 mg | ORAL_TABLET | Freq: Once | ORAL | Status: AC
  Administered 2019-02-03: 15:00:00 650 mg via ORAL
  Filled 2019-02-03: qty 2

## 2019-02-03 MED ORDER — KETOROLAC TROMETHAMINE 30 MG/ML IJ SOLN
30.0000 mg | Freq: Once | INTRAMUSCULAR | Status: AC
  Administered 2019-02-03: 17:00:00 30 mg via INTRAVENOUS
  Filled 2019-02-03: qty 1

## 2019-02-03 MED ORDER — METOCLOPRAMIDE HCL 10 MG PO TABS
10.0000 mg | ORAL_TABLET | Freq: Once | ORAL | Status: AC
  Administered 2019-02-03: 10 mg via ORAL
  Filled 2019-02-03: qty 1

## 2019-02-03 MED ORDER — SODIUM CHLORIDE 0.9 % IV BOLUS
500.0000 mL | Freq: Once | INTRAVENOUS | Status: AC
  Administered 2019-02-03: 15:00:00 500 mL via INTRAVENOUS

## 2019-02-03 NOTE — ED Triage Notes (Signed)
Chills and body aches, headaches and nausea x 2 days.

## 2019-02-03 NOTE — Discharge Instructions (Addendum)
Your exam, labs, and CXR are normal at this time. You have tested negative for COVID-19. Your symptoms are concerning for possible tickborne illness. Take your previously prescribed Doxycycline (100 mg am & pm) for the next 14 days. Follow-up with your provider or return as needed.

## 2019-02-03 NOTE — ED Provider Notes (Addendum)
[facility] Emergency Department Provider Note ____________________________________________  Time seen: 1408  I have reviewed the triage vital signs and the nursing notes.  HISTORY  Chief Complaint  Chills and Cough  HPI [name] is a 48 y.o. male with a history of type 1 diabetes, CKD, and HTN presents himself to the ED for evaluation of headaches, chills, and bodyaches. He reports nausea for the last 2 days without vomiting. He was evaluated and sent over from a local urgent care, after a negative rapid flu test. He also gives a remote history of recent tick bites over the last 3 weeks. He denies any rashes, chest pain, or syncope. He admits to shoulder muscle pain,  poor appetite, and noted some diarrhea today. His BS has not been over 200 mg/dl.   Past Medical History:  Diagnosis Date  . CKD (chronic kidney disease), stage II   . Gastroparesis   . Herniated cervical disc   . HTN (hypertension)   . Hypercholesteremia   . S/P cardiac cath    a. 10-15 yrs ago [facility] due to tachycardia, reportedly normal. b. Normal ETT 03/2012.  Marland Kitchen Sinus tachycardia    a. 24-hr Holter 03/2012 - SR, occ PVCs, no VT, avg HR 96bpm.  . Type 1 diabetes mellitus ([place])    a. With insulin pump.    Patient Active Problem List   Diagnosis Date Noted  . TIA (transient ischemic attack) 08/28/2018  . Erectile dysfunction 09/08/2017  . Class 2 severe obesity due to excess calories with serious comorbidity and body mass index (BMI) of 35.0 to 35.9 in adult ([place]) 08/02/2017  . CKD (chronic kidney disease) stage 3, GFR 30-59 ml/min (HCC) 04/22/2017  . Type 1 diabetes mellitus with diabetic neuropathy ([place]) 02/25/2017  . Uncontrolled type 1 diabetes mellitus with hyperglycemia ([place]) 02/25/2017  . Leg pain   . Acute kidney injury ([place])   . Sepsis ([place]) 09/07/2015  . Acute on chronic kidney failure ([place]) 09/07/2015  . Cellulitis of leg, right 09/07/2015  . Herniated cervical disc    . Hypercholesteremia   . HTN (hypertension)   . Nausea & vomiting 10/19/2012  . Diarrhea 10/19/2012  . DKA (diabetic ketoacidoses) ([place]) 10/19/2012  . Chest pain 03/13/2012  . Dyspnea 03/13/2012  . Tachycardia 03/13/2012  . IDDM (insulin dependent diabetes mellitus) ([place]) 03/13/2012    Past Surgical History:  Procedure Laterality Date  . cervical neck fusion    . HERNIA REPAIR     bilateral  . LEFT HEART CATHETERIZATION WITH CORONARY ANGIOGRAM N/A 12/18/2013   Procedure: LEFT HEART CATHETERIZATION WITH CORONARY ANGIOGRAM;  Surgeon: [name] M Martinique, MD;  Location: [facility] CATH LAB;  Service: Cardiovascular;  Laterality: N/A;    Prior to Admission medications   Medication Sig Start Date End Date Taking? Authorizing Provider  amLODipine (NORVASC) 5 MG tablet Take 1 tablet (5 mg total) by mouth daily. 03/31/18   Wellington Hampshire, MD  aspirin EC 81 MG tablet Take 1 tablet (81 mg total) by mouth daily. 08/29/18   Salary, Avel Peace, MD  atorvastatin (LIPITOR) 80 MG tablet Take 80 mg by mouth daily.    [provider]  BAYER CONTOUR NEXT TEST test strip Inject 1 strip into the skin 4 (four) times daily as needed. 12/23/16   [provider]  budesonide-formoterol (SYMBICORT) 160-4.5 MCG/ACT inhaler Inhale 2 puffs into the lungs 2 (two) times daily. 03/21/18   Flora Lipps, MD  carvedilol (COREG) 3.125 MG tablet Take  3.125 mg by mouth 2 (two) times daily.    [provider]  furosemide (LASIX) 40 MG tablet Take 40 mg by mouth daily.    [provider]  gentamicin cream (GARAMYCIN) 0.1 % Apply 1 application topically 2 (two) times daily. 06/27/18   Edrick Kins, DPM  HUMALOG 100 UNIT/ML injection Inject 100 Units into the skin daily. 11/11/16   [provider]  losartan (COZAAR) 100 MG tablet Take 100 mg by mouth daily.    [provider]  pantoprazole (PROTONIX) 40 MG tablet Take 1 tablet (40 mg total) by mouth daily. 03/21/18 03/21/19  Flora Lipps,  MD  Papaverine-Phentolamine 30-1 MG/ML SOLN 0.3 mLs by Intracavernosal route as directed. 04/18/18   Stoioff, Ronda Fairly, MD  sulfamethoxazole-trimethoprim (BACTRIM DS,SEPTRA DS) 800-160 MG tablet Take 1 tablet by mouth 2 (two) times daily. 11/28/18   Edrick Kins, DPM    Allergies Oxycontin [oxycodone hcl]; Penicillins; Prednisone; and Shellfish-derived products  Family History  Problem Relation Age of Onset  . Hypertension Other   . Coronary artery disease Other        Mother's side - both her side's grandparents died of heart disease (MIs)  . Diabetes Other   . Stroke Other        Paternal grandfather (73)  . Prostate cancer Neg Hx   . Bladder Cancer Neg Hx   . Kidney cancer Neg Hx     Social History Social History   Tobacco Use  . Smoking status: Never Smoker  . Smokeless tobacco: Never Used  Substance Use Topics  . Alcohol use: No  . Drug use: No    Review of Systems  Constitutional: Negative for fever. Reports chills and malaise Eyes: Negative for visual changes. ENT: Negative for sore throat. Cardiovascular: Negative for chest pain. Respiratory: Negative for shortness of breath. Gastrointestinal: Negative for abdominal pain,vomiting and diarrhea. Reports nausea Genitourinary: Negative for dysuria. Musculoskeletal: Negative for back pain. Reports muscle pains Skin: Negative for rash. Neurological: Negative for headaches, focal weakness or numbness. ____________________________________________  PHYSICAL EXAM:  VITAL SIGNS: ED Triage Vitals  Enc Vitals Group     BP 02/03/19 1350 (!) 160/89     Pulse Rate 02/03/19 1350 (!) 110     Resp 02/03/19 1350 20     Temp 02/03/19 1350 100 F (37.8 C)     Temp Source 02/03/19 1350 Oral     SpO2 02/03/19 1350 97 %     Weight 02/03/19 1351 255 lb (115.7 kg)     Height 02/03/19 1351 5\' 11"  (1.803 m)     Head Circumference --      Peak Flow --      Pain Score 02/03/19 1351 9     Pain Loc --      Pain Edu? --       Excl. in [place]? --     Constitutional: Alert and oriented. Well appearing and in no distress. Head: Normocephalic and atraumatic. Eyes: Conjunctivae are normal. Normal extraocular movements Cardiovascular: Normal rate, regular rhythm. Normal distal pulses. Respiratory: Normal respiratory effort. No wheezes/rales/rhonchi. Gastrointestinal: Soft and nontender. No distention. Musculoskeletal: Nontender with normal range of motion in all extremities.  Neurologic:  Normal gait without ataxia. Normal speech and language. No gross focal neurologic deficits are appreciated. Skin:  Skin is warm, dry and intact. No rash noted. Psychiatric: Mood and affect are normal. Patient exhibits appropriate insight and judgment. ____________________________________________   LABS (pertinent positives/negatives) Labs Reviewed  BASIC METABOLIC  PANEL - Abnormal; Notable for the following components:      Result Value   Sodium 132 (*)    Glucose, Bld 177 (*)    BUN 23 (*)    Creatinine, Ser 2.42 (*)    Calcium 8.3 (*)    GFR calc non Af Amer 31 (*)    GFR calc Af Amer 36 (*)    All other components within normal limits  CBC WITH DIFFERENTIAL/PLATELET - Abnormal; Notable for the following components:   Lymphs Abs 0.6 (*)    All other components within normal limits  SARS CORONAVIRUS 2 (HOSPITAL ORDER, [facility])  URINE CULTURE  LACTIC ACID, PLASMA  ___________________________________________   RADIOLOGY  CXR  Negative.  ____________________________________________  PROCEDURES  Procedures Tylenol 650 mg PO Zofran 4 mg IVP NS 1000 ml bolus ____________________________________________  INITIAL IMPRESSION / ASSESSMENT AND PLAN / ED COURSE  Hakim Minniefield Spenser was evaluated in Emergency Department on 02/03/2019 for the symptoms described in the history of present illness. He was evaluated in the context of the global COVID-19 pandemic, which necessitated consideration that  the patient might be at risk for infection with the SARS-CoV-2 virus that causes COVID-19. Institutional protocols and algorithms that pertain to the evaluation of patients at risk for COVID-19 are in a state of rapid change based on information released by regulatory bodies including the CDC and federal and state organizations. These policies and algorithms were followed during the patient's care in the ED.  Patient with a negative rapid COVID-19 test. His labs are reassuring as it shows some mild increase in his BUN/Cr consistent with dehydration. No indication of DKA, sepsis, CAP; and he is without respiratory distress or toxic appearance. He reports significant improvement following IV fluids and meds. He is requesting discharge at this time. He will be treated empirically for tickborne illness. He will take his previously prescribed Doxycycline (BID) as directed. Follow-up with return instructions are provided.  ____________________________________________  FINAL CLINICAL IMPRESSION(S) / ED DIAGNOSES  Final diagnoses:  Flu-like symptoms  Tick bite, initial encounter      Melvenia Needles, PA-C 02/03/19 [address] Dannielle Karvonen, PA-C 02/03/19 2027    Carrie Mew, MD 02/05/19 0006

## 2019-02-06 ENCOUNTER — Other Ambulatory Visit: Payer: Self-pay

## 2019-02-06 ENCOUNTER — Ambulatory Visit (INDEPENDENT_AMBULATORY_CARE_PROVIDER_SITE_OTHER): Payer: 59 | Admitting: Podiatry

## 2019-02-06 ENCOUNTER — Encounter: Payer: Self-pay | Admitting: Podiatry

## 2019-02-06 VITALS — Temp 98.2°F

## 2019-02-06 DIAGNOSIS — Z9889 Other specified postprocedural states: Secondary | ICD-10-CM

## 2019-02-06 DIAGNOSIS — E0843 Diabetes mellitus due to underlying condition with diabetic autonomic (poly)neuropathy: Secondary | ICD-10-CM

## 2019-02-06 DIAGNOSIS — L97522 Non-pressure chronic ulcer of other part of left foot with fat layer exposed: Secondary | ICD-10-CM

## 2019-02-06 NOTE — Progress Notes (Signed)
   Subjective:  Patient presents today status post exostectomy left hallux. DOS: 11/16/2018.  Patient is concerned because he noticed some red area to the bottom of his great toe.  He says that the swelling and erythema has actually gone down since last visit.  He has been wearing his diabetic shoes with insoles.  Past Medical History:  Diagnosis Date  . CKD (chronic kidney disease), stage II   . Gastroparesis   . Herniated cervical disc   . HTN (hypertension)   . Hypercholesteremia   . S/P cardiac cath    a. 10-15 yrs ago at [facility] due to tachycardia, reportedly normal. b. Normal ETT 03/2012.  Marland Kitchen Sinus tachycardia    a. 24-hr Holter 03/2012 - SR, occ PVCs, no VT, avg HR 96bpm.  . Type 1 diabetes mellitus ([place])    a. With insulin pump.      Objective/Physical Exam Neurovascular status intact.  Epicritic and protective threshold diminished.  Lateral incision site has healed completely.  There is some hyperkeratotic callus tissue noted to the plantar ulceration and with debridement there is a small superficial ulcer measuring approximately 0.3 x 0.3 x 0.1 cm.  No drainage.. Improved erythema and edema to the left hallux.   Assessment: 1. s/p exostectomy left hallux. DOS: 11/16/2018 2. 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 Mild edema left great toe   Plan of Care:  1. Patient was evaluated.  2.  Continue weightbearing in the diabetic shoes with insoles 3.  Medically necessary excisional debridement including subcutaneous tissue was performed using a surgical blade.  Excisional debridement of all necrotic nonviable tissue down to healthy bleeding viable tissue was performed with post debridement measurement same as pre-.  Light dressing applied 4.  Patient scheduled to return to work on Monday, 02/12/2019.  He will return to work with light duty restrictions x4 weeks 5.  Return to clinic in 2 weeks  Edrick Kins, DPM [facility]  Dr. Edrick Kins, [address]                                        [place], [place] [address]                Office [phone]  Fax [phone]

## 2019-02-20 ENCOUNTER — Ambulatory Visit (INDEPENDENT_AMBULATORY_CARE_PROVIDER_SITE_OTHER): Payer: 59 | Admitting: Podiatry

## 2019-02-20 ENCOUNTER — Encounter: Payer: Self-pay | Admitting: Podiatry

## 2019-02-20 ENCOUNTER — Ambulatory Visit: Payer: 59 | Admitting: Podiatry

## 2019-02-20 ENCOUNTER — Other Ambulatory Visit: Payer: Self-pay

## 2019-02-20 VITALS — Temp 96.4°F

## 2019-02-20 DIAGNOSIS — L97522 Non-pressure chronic ulcer of other part of left foot with fat layer exposed: Secondary | ICD-10-CM | POA: Diagnosis not present

## 2019-02-20 DIAGNOSIS — E0843 Diabetes mellitus due to underlying condition with diabetic autonomic (poly)neuropathy: Secondary | ICD-10-CM

## 2019-02-22 NOTE — Progress Notes (Signed)
   Subjective:  48 y.o. male with PMHx of diabetes mellitus presenting today for follow up evaluation of an ulceration of the left hallux. He states the wound has not changed much since his last visit. He has been using the post op shoe as directed. There are no modifying factors noted. Patient is here for further evaluation and treatment.   Past Medical History:  Diagnosis Date  . CKD (chronic kidney disease), stage II   . Gastroparesis   . Herniated cervical disc   . HTN (hypertension)   . Hypercholesteremia   . S/P cardiac cath    a. 10-15 yrs ago at [facility] due to tachycardia, reportedly normal. b. Normal ETT 03/2012.  Marland Kitchen Sinus tachycardia    a. 24-hr Holter 03/2012 - SR, occ PVCs, no VT, avg HR 96bpm.  . Type 1 diabetes mellitus ([place])    a. With insulin pump.      Objective/Physical Exam General: The patient is alert and oriented x3 in no acute distress.  Dermatology:  Wound #1 noted to the left hallux measuring 0.5 x 0.5 x 0.2 cm (LxWxD).   To the noted ulceration(s), there is no eschar. There is a moderate amount of slough, fibrin, and necrotic tissue noted. Granulation tissue and wound base is red. There is a minimal amount of serosanguineous drainage noted. There is no exposed bone muscle-tendon ligament or joint. There is no malodor. Periwound integrity is intact. Skin is warm, dry and supple bilateral lower extremities.  Vascular: Palpable pedal pulses bilaterally. No edema or erythema noted. Capillary refill within normal limits.  Neurological: Epicritic and protective threshold diminished bilaterally.   Musculoskeletal Exam: Range of motion within normal limits to all pedal and ankle joints bilateral. Muscle strength 5/5 in all groups bilateral.   Assessment: 1. 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 diabetes mellitus w/ peripheral neuropathy   Plan of Care:  1. Patient was evaluated. 2. medically necessary excisional debridement including  subcutaneous tissue was performed using a tissue nipper and a chisel blade. Excisional debridement of all the necrotic nonviable tissue down to healthy bleeding viable tissue was performed with post-debridement measurements same as pre-. 3. the wound was cleansed and dry sterile dressing applied. 4. Continue applying dry sterile dressing daily.  5. Continue using post op shoe as needed.  6. patient is to return to clinic in 2 weeks.   Edrick Kins, DPM [facility]  Dr. Edrick Kins, [address]                                        [place], [place] [address]                Office [phone]  Fax [phone]

## 2019-02-27 DIAGNOSIS — E1065 Type 1 diabetes mellitus with hyperglycemia: Secondary | ICD-10-CM | POA: Diagnosis not present

## 2019-03-06 DIAGNOSIS — E103293 Type 1 diabetes mellitus with mild nonproliferative diabetic retinopathy without macular edema, bilateral: Secondary | ICD-10-CM | POA: Diagnosis not present

## 2019-03-06 DIAGNOSIS — E1021 Type 1 diabetes mellitus with diabetic nephropathy: Secondary | ICD-10-CM | POA: Diagnosis not present

## 2019-03-06 DIAGNOSIS — E1022 Type 1 diabetes mellitus with diabetic chronic kidney disease: Secondary | ICD-10-CM | POA: Diagnosis not present

## 2019-03-06 DIAGNOSIS — E1040 Type 1 diabetes mellitus with diabetic neuropathy, unspecified: Secondary | ICD-10-CM | POA: Diagnosis not present

## 2019-03-09 ENCOUNTER — Encounter: Payer: Self-pay | Admitting: Podiatry

## 2019-03-09 ENCOUNTER — Ambulatory Visit: Payer: 59 | Admitting: Podiatry

## 2019-03-09 ENCOUNTER — Other Ambulatory Visit: Payer: Self-pay

## 2019-03-09 VITALS — Temp 97.4°F

## 2019-03-09 DIAGNOSIS — L97522 Non-pressure chronic ulcer of other part of left foot with fat layer exposed: Secondary | ICD-10-CM

## 2019-03-09 DIAGNOSIS — E0843 Diabetes mellitus due to underlying condition with diabetic autonomic (poly)neuropathy: Secondary | ICD-10-CM

## 2019-03-10 NOTE — Progress Notes (Signed)
   Subjective:  48 y.o. male with PMHx of diabetes mellitus presenting today for follow up evaluation of an ulceration of the left hallux. He states the wound has improved. He has been applying a dry sterile dressing daily. There are no modifying factors noted. Patient is here for further evaluation and treatment.   Past Medical History:  Diagnosis Date  . CKD (chronic kidney disease), stage II   . Gastroparesis   . Herniated cervical disc   . HTN (hypertension)   . Hypercholesteremia   . S/P cardiac cath    a. 10-15 yrs ago at [facility] due to tachycardia, reportedly normal. b. Normal ETT 03/2012.  Marland Kitchen Sinus tachycardia    a. 24-hr Holter 03/2012 - SR, occ PVCs, no VT, avg HR 96bpm.  . Type 1 diabetes mellitus ([place])    a. With insulin pump.      Objective/Physical Exam General: The patient is alert and oriented x3 in no acute distress.  Dermatology:  Wound #1 noted to the left hallux measuring 0.3 x 0.3 x 0.2 cm (LxWxD).   To the noted ulceration(s), there is no eschar. There is a moderate amount of slough, fibrin, and necrotic tissue noted. Granulation tissue and wound base is red. There is a minimal amount of serosanguineous drainage noted. There is no exposed bone muscle-tendon ligament or joint. There is no malodor. Periwound integrity is intact. Skin is warm, dry and supple bilateral lower extremities.  Vascular: Palpable pedal pulses bilaterally. No edema or erythema noted. Capillary refill within normal limits.  Neurological: Epicritic and protective threshold diminished bilaterally.   Musculoskeletal Exam: Range of motion within normal limits to all pedal and ankle joints bilateral. Muscle strength 5/5 in all groups bilateral.   Assessment: 1. 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 diabetes mellitus w/ peripheral neuropathy   Plan of Care:  1. Patient was evaluated. 2. medically necessary excisional debridement including subcutaneous tissue was  performed using a tissue nipper and a chisel blade. Excisional debridement of all the necrotic nonviable tissue down to healthy bleeding viable tissue was performed with post-debridement measurements same as pre-. 3. the wound was cleansed and dry sterile dressing applied. 4. Continue applying dry sterile dressing daily.  5. Continue wearing good shoe gear.  6. patient is to return to clinic in 3 weeks.   Edrick Kins, DPM [facility]  Dr. Edrick Kins, [address]                                        [place], [place] [address]                Office [phone]  Fax [phone]

## 2019-03-26 DIAGNOSIS — I10 Essential (primary) hypertension: Secondary | ICD-10-CM | POA: Diagnosis not present

## 2019-03-26 DIAGNOSIS — E1065 Type 1 diabetes mellitus with hyperglycemia: Secondary | ICD-10-CM | POA: Diagnosis not present

## 2019-03-26 DIAGNOSIS — N183 Chronic kidney disease, stage 3 (moderate): Secondary | ICD-10-CM | POA: Diagnosis not present

## 2019-03-26 DIAGNOSIS — E039 Hypothyroidism, unspecified: Secondary | ICD-10-CM | POA: Diagnosis not present

## 2019-03-26 DIAGNOSIS — R05 Cough: Secondary | ICD-10-CM | POA: Diagnosis not present

## 2019-03-30 ENCOUNTER — Ambulatory Visit: Payer: 59 | Admitting: Podiatry

## 2019-04-03 ENCOUNTER — Ambulatory Visit: Payer: 59 | Admitting: Podiatry

## 2019-04-03 ENCOUNTER — Other Ambulatory Visit: Payer: Self-pay

## 2019-04-03 ENCOUNTER — Encounter: Payer: Self-pay | Admitting: Podiatry

## 2019-04-03 VITALS — Temp 97.3°F

## 2019-04-03 DIAGNOSIS — L97522 Non-pressure chronic ulcer of other part of left foot with fat layer exposed: Secondary | ICD-10-CM

## 2019-04-03 DIAGNOSIS — E0843 Diabetes mellitus due to underlying condition with diabetic autonomic (poly)neuropathy: Secondary | ICD-10-CM | POA: Diagnosis not present

## 2019-04-03 DIAGNOSIS — Z9889 Other specified postprocedural states: Secondary | ICD-10-CM

## 2019-04-04 NOTE — Progress Notes (Signed)
   Subjective:  48 y.o. male with PMHx of diabetes mellitus presenting today for follow up evaluation of an ulceration of the left hallux. He states the wound has improved. He has been using Gentamicin cream daily with a dry sterile dressing. There are no modifying factors noted at this time. Patient is here for further evaluation and treatment.   Past Medical History:  Diagnosis Date  . CKD (chronic kidney disease), stage II   . Gastroparesis   . Herniated cervical disc   . HTN (hypertension)   . Hypercholesteremia   . S/P cardiac cath    a. 10-15 yrs ago at [facility] due [facility] tachycardia, reportedly normal. b. Normal ETT 03/2012.  Marland Kitchen Sinus tachycardia    a. 24-hr Holter 03/2012 - SR, occ PVCs, no VT, avg HR 96bpm.  . Type 1 diabetes mellitus ([place])    a. With insulin pump.      Objective/Physical Exam General: The patient is alert and oriented x3 in no acute distress.  Dermatology:  Wound #1 noted to the left hallux measuring 0.8 x 0.8 x 0.2 cm (LxWxD).   To the noted ulceration(s), there is no eschar. There is a moderate amount of slough, fibrin, and necrotic tissue noted. Granulation tissue and wound base is red. There is a minimal amount of serosanguineous drainage noted. There is no exposed bone muscle-tendon ligament or joint. There is no malodor. Periwound integrity is intact. Skin is warm, dry and supple bilateral lower extremities.  Vascular: Palpable pedal pulses bilaterally. No edema or erythema noted. Capillary refill within normal limits.  Neurological: Epicritic and protective threshold diminished bilaterally.   Musculoskeletal Exam: Range of motion within normal limits to all pedal and ankle joints bilateral. Muscle strength 5/5 in all groups bilateral.   Assessment: 1. 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 diabetes mellitus w/ peripheral neuropathy   Plan of Care:  1. Patient was evaluated. 2. medically necessary excisional debridement  including subcutaneous tissue was performed using a tissue nipper and a chisel blade. Excisional debridement of all the necrotic nonviable tissue down to healthy bleeding viable tissue was performed with post-debridement measurements same as pre-. 3. the wound was cleansed and dry sterile dressing applied. 4. Continue using Gentamicin cream daily with a dry sterile dressing.  5. Recommended arthrodesis surgery at some point, but patient cannot financially take the time off work.  6. patient is to return to clinic in 3 weeks.   Edrick Kins, DPM [facility]  Dr. Edrick Kins, [place]                                        [place], [place] [address]                Office [phone]  Fax [phone]

## 2019-04-16 DIAGNOSIS — E1022 Type 1 diabetes mellitus with diabetic chronic kidney disease: Secondary | ICD-10-CM | POA: Diagnosis not present

## 2019-04-16 DIAGNOSIS — E1021 Type 1 diabetes mellitus with diabetic nephropathy: Secondary | ICD-10-CM | POA: Diagnosis not present

## 2019-04-16 DIAGNOSIS — E1040 Type 1 diabetes mellitus with diabetic neuropathy, unspecified: Secondary | ICD-10-CM | POA: Diagnosis not present

## 2019-04-16 DIAGNOSIS — E103293 Type 1 diabetes mellitus with mild nonproliferative diabetic retinopathy without macular edema, bilateral: Secondary | ICD-10-CM | POA: Diagnosis not present

## 2019-04-17 ENCOUNTER — Other Ambulatory Visit: Payer: Self-pay | Admitting: Podiatry

## 2019-04-17 ENCOUNTER — Ambulatory Visit: Payer: 59 | Admitting: Podiatry

## 2019-04-17 ENCOUNTER — Ambulatory Visit (INDEPENDENT_AMBULATORY_CARE_PROVIDER_SITE_OTHER): Payer: 59

## 2019-04-17 ENCOUNTER — Other Ambulatory Visit: Payer: 59

## 2019-04-17 ENCOUNTER — Encounter: Payer: Self-pay | Admitting: Podiatry

## 2019-04-17 ENCOUNTER — Other Ambulatory Visit: Payer: Self-pay

## 2019-04-17 VITALS — Temp 98.0°F

## 2019-04-17 DIAGNOSIS — M779 Enthesopathy, unspecified: Secondary | ICD-10-CM | POA: Diagnosis not present

## 2019-04-17 DIAGNOSIS — M722 Plantar fascial fibromatosis: Secondary | ICD-10-CM

## 2019-04-17 DIAGNOSIS — M778 Other enthesopathies, not elsewhere classified: Secondary | ICD-10-CM

## 2019-04-17 MED ORDER — COLCHICINE 0.6 MG PO TABS: 0.6000 mg | ORAL_TABLET | Freq: Every day | ORAL | 0 refills | Status: DC

## 2019-04-19 NOTE — Progress Notes (Signed)
   Subjective: 48 y.o. male with PMHx of T1DM presenting today for follow up evaluation of an ulceration of the left hallux. He states the wound is improving and is healing appropriately. He has been using Gentamicin cream as directed. He denies any worsening factors.  He also has a new complaint of throbbing, nagging pain to the plantar aspect of the bilateral arches that began about one week ago. He states it feels as if he is walking on a bruise or a stone. Walking increases his pain. He has been taking OTC Tylenol with no significant relief. Patient is here for further evaluation and treatment.   Past Medical History:  Diagnosis Date  . CKD (chronic kidney disease), stage II   . Gastroparesis   . Herniated cervical disc   . HTN (hypertension)   . Hypercholesteremia   . S/P cardiac cath    a. 10-15 yrs ago at [facility] due [facility] tachycardia, reportedly normal. b. Normal ETT 03/2012.  Marland Kitchen Sinus tachycardia    a. 24-hr Holter 03/2012 - SR, occ PVCs, no VT, avg HR 96bpm.  . Type 1 diabetes mellitus ([place])    a. With insulin pump.     Objective: Physical Exam General: The patient is alert and oriented x3 in no acute distress.  Dermatology: Wound noted to the left hallux has healed. Complete re-epithelialization has occurred. No drainage noted.  Skin is warm, dry and supple bilateral lower extremities. Negative for open lesions or macerations bilateral.   Vascular: Dorsalis Pedis and Posterior Tibial pulses palpable bilateral.  Capillary fill time is immediate to all digits.  Neurological: Epicritic and protective threshold intact bilateral.   Musculoskeletal: Tenderness to palpation to the plantar aspect of the left heel along the plantar fascia with erythema, edema and warmth of the left arch. All other joints range of motion within normal limits bilateral. Strength 5/5 in all groups bilateral.   Radiographic exam: Normal osseous mineralization. Joint spaces preserved. No  fracture/dislocation/boney destruction. No other soft tissue abnormalities or radiopaque foreign bodies.   Assessment: 1. Inflammatory plantar fasciitis left midsubstance  2. Ulceration of the left hallux secondary to diabetes mellitus - healed 3. T1DM with peripheral neuropathy   Plan of Care:  1. Patient evaluated. Xrays reviewed.   2. Light debridement of the ulceration of the left hallux performed with a tissue nipper.  3. Prescription for Colcrys 0.6 mg #14 provided to patient.  4. Resume using CAM boot. Weightbearing as tolerated.  5. Return to clinic in 10 days.    Edrick Kins, DPM [facility]  Dr. Edrick Kins, DPM    [address], [place] [address]                Office [phone]  Fax [phone]

## 2019-04-24 ENCOUNTER — Ambulatory Visit: Payer: 59 | Admitting: Podiatry

## 2019-04-27 ENCOUNTER — Ambulatory Visit: Payer: 59 | Admitting: Podiatry

## 2019-04-27 ENCOUNTER — Encounter: Payer: Self-pay | Admitting: Podiatry

## 2019-04-27 ENCOUNTER — Other Ambulatory Visit: Payer: Self-pay

## 2019-04-27 VITALS — Temp 97.3°F

## 2019-04-27 DIAGNOSIS — M722 Plantar fascial fibromatosis: Secondary | ICD-10-CM

## 2019-04-27 DIAGNOSIS — L97522 Non-pressure chronic ulcer of other part of left foot with fat layer exposed: Secondary | ICD-10-CM | POA: Diagnosis not present

## 2019-04-27 DIAGNOSIS — E0843 Diabetes mellitus due to underlying condition with diabetic autonomic (poly)neuropathy: Secondary | ICD-10-CM | POA: Diagnosis not present

## 2019-05-04 NOTE — Progress Notes (Signed)
   Subjective: 48 y.o. male with PMHx of T1DM presenting today for follow up evaluation of plantar fasciitis of the left foot. He reports continued pain in the arch. He has been using the CAM boot and taking Colcrys as directed. There are no modifying factors noted at this time. Patient is here for further evaluation and treatment.   Past Medical History:  Diagnosis Date  . CKD (chronic kidney disease), stage II   . Gastroparesis   . Herniated cervical disc   . HTN (hypertension)   . Hypercholesteremia   . S/P cardiac cath    a. 10-15 yrs ago at [facility] due to tachycardia, reportedly normal. b. Normal ETT 03/2012.  Marland Kitchen Sinus tachycardia    a. 24-hr Holter 03/2012 - SR, occ PVCs, no VT, avg HR 96bpm.  . Type 1 diabetes mellitus ([place])    a. With insulin pump.     Objective: Physical Exam General: The patient is alert and oriented x3 in no acute distress.  Dermatology: Wound noted to the left hallux has healed. Complete re-epithelialization has occurred. No drainage noted.  Skin is warm, dry and supple bilateral lower extremities. Negative for open lesions or macerations bilateral.   Vascular: Dorsalis Pedis and Posterior Tibial pulses palpable bilateral.  Capillary fill time is immediate to all digits.  Neurological: Epicritic and protective threshold intact bilateral.   Musculoskeletal: Tenderness to palpation to the plantar aspect of the left heel along the plantar fascia with erythema, edema and warmth of the left arch. All other joints range of motion within normal limits bilateral. Strength 5/5 in all groups bilateral.   Assessment: 1. Inflammatory plantar fasciitis left midsubstance - improved  2. Ulceration of the left hallux secondary to diabetes mellitus - healed 3. T1DM with peripheral neuropathy   Plan of Care:  1. Patient evaluated.  2. Injection of 0.5 mLs Celestone Soluspan injected into the midsubstance of the plantar fascia of the left foot.  3. Recommended OTC Urea 40%  cream.  4. Prescription for Meloxicam provided to patient. 5. Recommended good shoe gear.  6. Return to clinic in 4 weeks.    Edrick Kins, DPM [facility]  Dr. Edrick Kins, DPM    [address], [place] [address]                Office [phone]  Fax [phone]

## 2019-06-01 ENCOUNTER — Ambulatory Visit: Payer: 59 | Admitting: Podiatry

## 2019-06-01 ENCOUNTER — Other Ambulatory Visit: Payer: Self-pay

## 2019-06-01 ENCOUNTER — Encounter: Payer: Self-pay | Admitting: Podiatry

## 2019-06-01 DIAGNOSIS — E0843 Diabetes mellitus due to underlying condition with diabetic autonomic (poly)neuropathy: Secondary | ICD-10-CM

## 2019-06-01 DIAGNOSIS — L97522 Non-pressure chronic ulcer of other part of left foot with fat layer exposed: Secondary | ICD-10-CM

## 2019-06-03 NOTE — Progress Notes (Signed)
   Subjective: 48 y.o. male with PMHx of T1DM presenting today for follow up evaluation of plantar fasciitis of the left foot. He states he is doing well. He reports some mild intermittent pain noted to the left heel and arch but it is much improved from what it was previously. He has been wearing his DM shoes and taking Meloxicam which helps alleviate his pain. He denies any worsening factors at this time. Patient is here for further evaluation and treatment.   Past Medical History:  Diagnosis Date  . CKD (chronic kidney disease), stage II   . Gastroparesis   . Herniated cervical disc   . HTN (hypertension)   . Hypercholesteremia   . S/P cardiac cath    a. 10-15 yrs ago at [facility] due to tachycardia, reportedly normal. b. Normal ETT 03/2012.  Marland Kitchen Sinus tachycardia    a. 24-hr Holter 03/2012 - SR, occ PVCs, no VT, avg HR 96bpm.  . Type 1 diabetes mellitus ([place])    a. With insulin pump.     Objective: Physical Exam General: The patient is alert and oriented x3 in no acute distress.  Dermatology: Wound noted to the left hallux has healed. Complete re-epithelialization has occurred. No drainage noted.  Skin is cool, dry and supple bilateral lower extremities. Negative for open lesions or macerations.  Vascular: Palpable pedal pulses bilaterally. No edema or erythema noted. Capillary refill within normal limits.  Neurological: Epicritic and protective threshold diminished bilaterally.   Musculoskeletal Exam: All pedal and ankle joints range of motion within normal limits bilateral. Muscle strength 5/5 in all groups bilateral.    Assessment: 1. Inflammatory plantar fasciitis left midsubstance - resolved  2. Ulceration of the left hallux secondary to diabetes mellitus - healed 3. T1DM with peripheral neuropathy   Plan of Care:  1. Patient evaluated.  2. Continue wearing DM shoes.  3. May resume full activity with no restrictions.  4. Return to clinic in 6 months for routine care.     Edrick Kins, DPM [facility]  Dr. Edrick Kins, DPM    [address], [place] [address]                Office [phone]  Fax [phone]

## 2019-07-13 ENCOUNTER — Other Ambulatory Visit: Payer: Self-pay

## 2019-07-13 ENCOUNTER — Encounter: Payer: Self-pay | Admitting: *Deleted

## 2019-07-13 ENCOUNTER — Emergency Department
Admission: EM | Admit: 2019-07-13 | Discharge: 2019-07-13 | Disposition: A | Discharge status: Home / Self Care | Payer: 59 | Attending: Student | Admitting: Student

## 2019-07-13 DIAGNOSIS — I959 Hypotension, unspecified: Secondary | ICD-10-CM | POA: Diagnosis not present

## 2019-07-13 DIAGNOSIS — E860 Dehydration: Secondary | ICD-10-CM | POA: Diagnosis not present

## 2019-07-13 DIAGNOSIS — R42 Dizziness and giddiness: Secondary | ICD-10-CM | POA: Diagnosis not present

## 2019-07-13 DIAGNOSIS — Z794 Long term (current) use of insulin: Secondary | ICD-10-CM | POA: Insufficient documentation

## 2019-07-13 DIAGNOSIS — Z79899 Other long term (current) drug therapy: Secondary | ICD-10-CM | POA: Diagnosis not present

## 2019-07-13 DIAGNOSIS — N183 Chronic kidney disease, stage 3 unspecified: Secondary | ICD-10-CM | POA: Insufficient documentation

## 2019-07-13 DIAGNOSIS — R519 Headache, unspecified: Secondary | ICD-10-CM | POA: Insufficient documentation

## 2019-07-13 DIAGNOSIS — Z7982 Long term (current) use of aspirin: Secondary | ICD-10-CM | POA: Insufficient documentation

## 2019-07-13 DIAGNOSIS — I129 Hypertensive chronic kidney disease with stage 1 through stage 4 chronic kidney disease, or unspecified chronic kidney disease: Secondary | ICD-10-CM | POA: Insufficient documentation

## 2019-07-13 DIAGNOSIS — R55 Syncope and collapse: Secondary | ICD-10-CM | POA: Diagnosis not present

## 2019-07-13 DIAGNOSIS — E1022 Type 1 diabetes mellitus with diabetic chronic kidney disease: Secondary | ICD-10-CM | POA: Insufficient documentation

## 2019-07-13 LAB — CBC
HCT: 38.8 % — ABNORMAL LOW (ref 39.0–52.0)
Hemoglobin: 12.8 g/dL — ABNORMAL LOW (ref 13.0–17.0)
MCH: 28.2 pg (ref 26.0–34.0)
MCHC: 33 g/dL (ref 30.0–36.0)
MCV: 85.5 fL (ref 80.0–100.0)
Platelets: 288 10*3/uL (ref 150–400)
RBC: 4.54 MIL/uL (ref 4.22–5.81)
RDW: 13.1 % (ref 11.5–15.5)
WBC: 7.6 10*3/uL (ref 4.0–10.5)
nRBC: 0 % (ref 0.0–0.2)

## 2019-07-13 LAB — BASIC METABOLIC PANEL
Anion gap: 9 (ref 5–15)
BUN: 52 mg/dL — ABNORMAL HIGH (ref 6–20)
CO2: 22 mmol/L (ref 22–32)
Calcium: 8.9 mg/dL (ref 8.9–10.3)
Chloride: 103 mmol/L (ref 98–111)
Creatinine, Ser: 3.02 mg/dL — ABNORMAL HIGH (ref 0.61–1.24)
GFR calc Af Amer: 27 mL/min — ABNORMAL LOW (ref >60)
GFR calc non Af Amer: 24 mL/min — ABNORMAL LOW (ref >60)
Glucose, Bld: 146 mg/dL — ABNORMAL HIGH (ref 70–99)
Potassium: 4.6 mmol/L (ref 3.5–5.1)
Sodium: 134 mmol/L — ABNORMAL LOW (ref 135–145)

## 2019-07-13 LAB — TROPONIN I (HIGH SENSITIVITY)
Troponin I (High Sensitivity): 13 ng/L (ref <18)
Troponin I (High Sensitivity): 11 ng/L (ref <18)

## 2019-07-13 LAB — GLUCOSE, CAPILLARY
Glucose-Capillary: 131 mg/dL — ABNORMAL HIGH (ref 70–99)
Glucose-Capillary: 161 mg/dL — ABNORMAL HIGH (ref 70–99)

## 2019-07-13 MED ORDER — SODIUM CHLORIDE 0.9% FLUSH
3.0000 mL | Freq: Once | INTRAVENOUS | Status: AC
  Administered 2019-07-13: 3 mL via INTRAVENOUS

## 2019-07-13 MED ORDER — SODIUM CHLORIDE 0.9 % IV BOLUS
1000.0000 mL | Freq: Once | INTRAVENOUS | Status: AC
  Administered 2019-07-13: 1000 mL via INTRAVENOUS

## 2019-07-13 MED ORDER — ACETAMINOPHEN 500 MG PO TABS
1000.0000 mg | ORAL_TABLET | Freq: Once | ORAL | Status: AC
  Administered 2019-07-13: 1000 mg via ORAL
  Filled 2019-07-13: qty 2

## 2019-07-13 NOTE — ED Notes (Signed)
Lab notified trop needs to be added to labs

## 2019-07-13 NOTE — ED Provider Notes (Signed)
[facility] Emergency Department Provider Note  ____________________________________________   First MD Initiated Contact with Patient 07/13/19 1127     (approximate)  I have reviewed the triage vital signs and the nursing notes.  History  Chief Complaint Hypotension    HPI [name] is a 48 y.o. male with history of CKD, HTN, diabetes who presents for an episode of lightheadedness/near syncope and initial hypotension. Patient was outside working when he began to feel faint and experienced tunnel vision. He became pale and diaphoretic. No loss of consciousness. BP was taken and noted to by hypotensive at the time, on arrival BP improved to 130s/80s. CBG 131. Patient states he had been off his HTN medication for some time due to side effects, but that he restarted them a few days ago, including 12 mg carvedilol and 100 mg losartan. He took both of these this morning. He did not have anything to eat or drink aside from his medication before getting to work. In the ED he reports feeling moderately improved, but still with mild headache, 5/10. No chest pain, SOB, difficulty breathing, or palpitations. No fevers, chills, nausea, vomiting, or recent illnesses or infection. No bloody or melanotic stool.   Past Medical Hx Past Medical History:  Diagnosis Date  . CKD (chronic kidney disease), stage II   . Gastroparesis   . Herniated cervical disc   . HTN (hypertension)   . Hypercholesteremia   . S/P cardiac cath    a. 10-15 yrs ago at [facility] due to tachycardia, reportedly normal. b. Normal ETT 03/2012.  Marland Kitchen Sinus tachycardia    a. 24-hr Holter 03/2012 - SR, occ PVCs, no VT, avg HR 96bpm.  . Type 1 diabetes mellitus ([place])    a. With insulin pump.    Problem List Patient Active Problem List   Diagnosis Date Noted  . TIA (transient ischemic attack) 08/28/2018  . Erectile dysfunction 09/08/2017  . Class 2 severe obesity due to excess calories with serious  comorbidity and body mass index (BMI) of 35.0 to 35.9 in adult ([place]) 08/02/2017  . CKD (chronic kidney disease) stage 3, GFR 30-59 ml/min 04/22/2017  . Type 1 diabetes mellitus with diabetic neuropathy ([place]) 02/25/2017  . Uncontrolled type 1 diabetes mellitus with hyperglycemia ([place]) 02/25/2017  . Leg pain   . Acute kidney injury ([place])   . Sepsis ([place]) 09/07/2015  . Acute on chronic kidney failure ([place]) 09/07/2015  . Cellulitis of leg, right 09/07/2015  . Herniated cervical disc   . Hypercholesteremia   . HTN (hypertension)   . Nausea & vomiting 10/19/2012  . Diarrhea 10/19/2012  . DKA (diabetic ketoacidoses) ([place]) 10/19/2012  . Chest pain 03/13/2012  . Dyspnea 03/13/2012  . Tachycardia 03/13/2012  . IDDM (insulin dependent diabetes mellitus) ([place]) 03/13/2012    Past Surgical Hx Past Surgical History:  Procedure Laterality Date  . cervical neck fusion    . HERNIA REPAIR     bilateral  . LEFT HEART CATHETERIZATION WITH CORONARY ANGIOGRAM N/A 12/18/2013   Procedure: LEFT HEART CATHETERIZATION WITH CORONARY ANGIOGRAM;  Surgeon: [name] M Martinique, MD;  Location: [facility] CATH LAB;  Service: Cardiovascular;  Laterality: N/A;    Medications Prior to Admission medications   Medication Sig Start Date End Date Taking? Authorizing Provider  amLODipine (NORVASC) 5 MG tablet Take 1 tablet (5 mg total) by mouth daily. 03/31/18   Wellington Hampshire, MD  aspirin EC 81 MG tablet Take 1 tablet (81 mg total) by mouth daily.  08/29/18   Salary, Avel Peace, MD  atorvastatin (LIPITOR) 80 MG tablet Take 80 mg by mouth daily.    [provider]  BAYER CONTOUR NEXT TEST test strip Inject 1 strip into the skin 4 (four) times daily as needed. 12/23/16   [provider]  budesonide-formoterol (SYMBICORT) 160-4.5 MCG/ACT inhaler Inhale 2 puffs into the lungs 2 (two) times daily. 03/21/18   Flora Lipps, MD  carvedilol (COREG) 3.125 MG tablet Take 3.125 mg by mouth 2 (two) times daily.    [provider]  colchicine 0.6 MG tablet Take 1 tablet (0.6 mg total) by mouth daily. Take 2 tablets stat, then 1 tablet daily until finished. 04/17/19   Edrick Kins, DPM  furosemide (LASIX) 40 MG tablet Take 40 mg by mouth daily.    [provider]  gentamicin cream (GARAMYCIN) 0.1 % Apply 1 application topically 2 (two) times daily. 06/27/18   Edrick Kins, DPM  HUMALOG 100 UNIT/ML injection Inject 100 Units into the skin daily. 11/11/16   [provider]  losartan (COZAAR) 100 MG tablet Take 100 mg by mouth daily.    [provider]  pantoprazole (PROTONIX) 40 MG tablet Take 1 tablet (40 mg total) by mouth daily. 03/21/18 03/21/19  Flora Lipps, MD  Papaverine-Phentolamine 30-1 MG/ML SOLN 0.3 mLs by Intracavernosal route as directed. 04/18/18   Stoioff, Ronda Fairly, MD    Allergies Oxycontin [oxycodone hcl], Penicillins, Prednisone, and Shellfish-derived products  Family Hx Family History  Problem Relation Age of Onset  . Hypertension Other   . Coronary artery disease Other        Mother's side - both her side's grandparents died of heart disease (MIs)  . Diabetes Other   . Stroke Other        Paternal grandfather (55)  . Prostate cancer Neg Hx   . Bladder Cancer Neg Hx   . Kidney cancer Neg Hx     Social Hx Social History   Tobacco Use  . Smoking status: Never Smoker  . Smokeless tobacco: Never Used  Substance Use Topics  . Alcohol use: No  . Drug use: No     Review of Systems  Constitutional: Negative for fever, chills. +lightheaded, near syncope Eyes: Negative for visual changes. ENT: Negative for sore throat. Cardiovascular: Negative for chest pain. Respiratory: Negative for shortness of breath. Gastrointestinal: Negative for nausea, vomiting.  Genitourinary: Negative for dysuria. Musculoskeletal: Negative for leg swelling. Skin: Negative for rash. Neurological: Negative for for headaches.   Physical Exam  Vital Signs: ED Triage  Vitals  Enc Vitals Group     BP 07/13/19 1130 135/81     Pulse Rate 07/13/19 1130 82     Resp 07/13/19 1130 14     Temp 07/13/19 1130 98 F (36.7 C)     Temp Source 07/13/19 1130 Oral     SpO2 07/13/19 1130 97 %     Weight 07/13/19 1127 255 lb 1.2 oz (115.7 kg)     Height 07/13/19 1127 5\' 11"  (1.803 m)     Head Circumference --      Peak Flow --      Pain Score 07/13/19 1127 5     Pain Loc --      Pain Edu? --      Excl. in [place]? --     Constitutional: Alert and oriented.  Head: Normocephalic. Atraumatic. Eyes: Conjunctivae clear. Sclera anicteric. Nose: No congestion. No rhinorrhea. Mouth/Throat: Mucous membranes are moist.  Neck: No stridor.   Cardiovascular: Normal rate, regular rhythm. No murmurs. Extremities well perfused. Respiratory: Normal respiratory effort.  Lungs CTAB. Gastrointestinal: Soft. Non-tender. Non-distended.  Musculoskeletal: No lower extremity edema. No deformities. Neurologic:  Normal speech and language. No gross focal neurologic deficits are appreciated.  Skin: Skin is warm, dry and intact. No rash noted. Psychiatric: Mood and affect are appropriate for situation.  EKG  Personally reviewed.   Rate: 82 Rhythm: sinus Axis: normal Intervals: question 1st degree block No acute ischemic changes No evidence of prolonged QTc, Brug[place], or WPW No STEMI    Radiology  N/A   Procedures  Procedure(s) performed (including critical care):  Procedures   Initial Impression / Assessment and Plan / ED Course  48 y.o. male who presents to the ED for lightheadedness, near syncope, in the setting of newly restarting his HTN medication and nothing to eat/drink this AM.  Ddx: medication side effect, dehydration, electrolyte abnormality, atypical ACS, arrhythmia, anemia  Plan: labs, EKG, fluids, encourage PO  No significant anemia. Cr slightly increased from baseline, consistent with mild AKI and likely element of dehydration. EKG w/o acute ischemic  changes or evidence of arrhythmia. HS troponin x 2 negative, and down trending. BP stable. Received IVF and took PO well. As such, will plan for discharged. Encouraged adequate PO, and touching base with his PCP for recommendations for properly restarting his medications. He voices understanding of this. Given return precautions.   Final Clinical Impression(s) / ED Diagnosis  Final diagnoses:  Dehydration  Lightheaded       Note:  This document was prepared using Dragon voice recognition software and may include unintentional dictation errors.   Lilia Pro., MD 07/13/19 [phone]

## 2019-07-13 NOTE — ED Notes (Signed)
MD at bedside. 

## 2019-07-13 NOTE — ED Notes (Signed)
Pt given turkey sandwich tray 

## 2019-07-13 NOTE — ED Triage Notes (Signed)
Pt working when he became lightheaded, pale and diaphoretic. First nurse checked BP and confirmed pt was hypotensive. Pt recently started a new BP medication and is a diabetic with an insulin pump and reports he has not eaten this morning. CBG is 131 upon checking. Pt arrived to treatment room alert and oriented x 4 but continues to be pale and diaphoretic.

## 2019-07-13 NOTE — Discharge Instructions (Addendum)
Please be sure to stay well-hydrated and continue to eat a healthy diet.  Please touch base with your primary care doctor to schedule an appointment to discuss your blood pressure medication usage.  Please return to the emergency department for any new or worsening symptoms.

## 2019-07-13 NOTE — ED Notes (Signed)
Pt reports headache 7/10, Dr. Joan Mayans informed

## 2019-09-21 ENCOUNTER — Encounter: Payer: Self-pay | Admitting: Emergency Medicine

## 2019-09-21 ENCOUNTER — Emergency Department
Admission: EM | Admit: 2019-09-21 | Discharge: 2019-09-21 | Disposition: A | Discharge status: Home / Self Care | Payer: PRIVATE HEALTH INSURANCE | Attending: Emergency Medicine | Admitting: Emergency Medicine

## 2019-09-21 ENCOUNTER — Other Ambulatory Visit: Payer: Self-pay

## 2019-09-21 DIAGNOSIS — Z79899 Other long term (current) drug therapy: Secondary | ICD-10-CM | POA: Diagnosis not present

## 2019-09-21 DIAGNOSIS — H16133 Photokeratitis, bilateral: Secondary | ICD-10-CM | POA: Diagnosis not present

## 2019-09-21 DIAGNOSIS — N182 Chronic kidney disease, stage 2 (mild): Secondary | ICD-10-CM | POA: Insufficient documentation

## 2019-09-21 DIAGNOSIS — Z7982 Long term (current) use of aspirin: Secondary | ICD-10-CM | POA: Insufficient documentation

## 2019-09-21 DIAGNOSIS — I129 Hypertensive chronic kidney disease with stage 1 through stage 4 chronic kidney disease, or unspecified chronic kidney disease: Secondary | ICD-10-CM | POA: Diagnosis not present

## 2019-09-21 DIAGNOSIS — E1022 Type 1 diabetes mellitus with diabetic chronic kidney disease: Secondary | ICD-10-CM | POA: Diagnosis not present

## 2019-09-21 DIAGNOSIS — I10 Essential (primary) hypertension: Secondary | ICD-10-CM | POA: Diagnosis not present

## 2019-09-21 DIAGNOSIS — H5713 Ocular pain, bilateral: Secondary | ICD-10-CM | POA: Diagnosis present

## 2019-09-21 MED ORDER — TETRACAINE HCL 0.5 % OP SOLN
2.0000 [drp] | Freq: Once | OPHTHALMIC | Status: AC
  Administered 2019-09-21: 2 [drp] via OPHTHALMIC
  Filled 2019-09-21: qty 4

## 2019-09-21 MED ORDER — FLUORESCEIN SODIUM 1 MG OP STRP
1.0000 | ORAL_STRIP | Freq: Once | OPHTHALMIC | Status: AC
  Administered 2019-09-21: 1 via OPHTHALMIC
  Filled 2019-09-21: qty 1

## 2019-09-21 MED ORDER — ERYTHROMYCIN 5 MG/GM OP OINT: 1.0000 "application " | TOPICAL_OINTMENT | Freq: Three times a day (TID) | OPHTHALMIC | 0 refills | Status: DC

## 2019-09-21 MED ORDER — HYDROCODONE-ACETAMINOPHEN 5-325 MG PO TABS: 1.0000 | ORAL_TABLET | Freq: Four times a day (QID) | ORAL | 0 refills | Status: DC | PRN

## 2019-09-21 NOTE — ED Provider Notes (Signed)
[facility] Emergency Department Provider Note  ____________________________________________   First MD Initiated Contact with Patient 09/21/19 1653     (approximate)  I have reviewed the triage vital signs and the nursing notes.   HISTORY  Chief Complaint Eye Problem    HPI [name] is a 48 y.o. male presents emergency department complaining of his eyes burning and hurting after being exposed to the Tru-D ultraviolet light upstairs.  He states he was walking down the hallway in turned into her room in which the light was running.  He states he did not hurt at first.  States as the days gone by his eyes and begun to hurt and staying in feel gritty.  He states it feels like he has a welders burn.    Past Medical History:  Diagnosis Date  . CKD (chronic kidney disease), stage II   . Gastroparesis   . Herniated cervical disc   . HTN (hypertension)   . Hypercholesteremia   . S/P cardiac cath    a. 10-15 yrs ago at [facility] due to tachycardia, reportedly normal. b. Normal ETT 03/2012.  Marland Kitchen Sinus tachycardia    a. 24-hr Holter 03/2012 - SR, occ PVCs, no VT, avg HR 96bpm.  . Type 1 diabetes mellitus ([place])    a. With insulin pump.    Patient Active Problem List   Diagnosis Date Noted  . TIA (transient ischemic attack) 08/28/2018  . Erectile dysfunction 09/08/2017  . Class 2 severe obesity due to excess calories with serious comorbidity and body mass index (BMI) of 35.0 to 35.9 in adult ([place]) 08/02/2017  . CKD (chronic kidney disease) stage 3, GFR 30-59 ml/min 04/22/2017  . Type 1 diabetes mellitus with diabetic neuropathy ([place]) 02/25/2017  . Uncontrolled type 1 diabetes mellitus with hyperglycemia ([place]) 02/25/2017  . Leg pain   . Acute kidney injury ([place])   . Sepsis ([place]) 09/07/2015  . Acute on chronic kidney failure ([place]) 09/07/2015  . Cellulitis of leg, right 09/07/2015  . Herniated cervical disc   . Hypercholesteremia   . HTN (hypertension)    . Nausea & vomiting 10/19/2012  . Diarrhea 10/19/2012  . DKA (diabetic ketoacidoses) ([place]) 10/19/2012  . Chest pain 03/13/2012  . Dyspnea 03/13/2012  . Tachycardia 03/13/2012  . IDDM (insulin dependent diabetes mellitus) ([place]) 03/13/2012    Past Surgical History:  Procedure Laterality Date  . cervical neck fusion    . HERNIA REPAIR     bilateral  . LEFT HEART CATHETERIZATION WITH CORONARY ANGIOGRAM N/A 12/18/2013   Procedure: LEFT HEART CATHETERIZATION WITH CORONARY ANGIOGRAM;  Surgeon: [name] M Martinique, MD;  Location: [facility] CATH LAB;  Service: Cardiovascular;  Laterality: N/A;    Prior to Admission medications   Medication Sig Start Date End Date Taking? Authorizing Provider  amLODipine (NORVASC) 5 MG tablet Take 1 tablet (5 mg total) by mouth daily. 03/31/18   Wellington Hampshire, MD  aspirin EC 81 MG tablet Take 1 tablet (81 mg total) by mouth daily. 08/29/18   Salary, Avel Peace, MD  atorvastatin (LIPITOR) 80 MG tablet Take 80 mg by mouth daily.    [provider]  BAYER CONTOUR NEXT TEST test strip Inject 1 strip into the skin 4 (four) times daily as needed. 12/23/16   [provider]  budesonide-formoterol (SYMBICORT) 160-4.5 MCG/ACT inhaler Inhale 2 puffs into the lungs 2 (two) times daily. 03/21/18   Flora Lipps, MD  carvedilol (COREG) 3.125 MG tablet Take 3.125 mg by  mouth 2 (two) times daily.    [provider]  colchicine 0.6 MG tablet Take 1 tablet (0.6 mg total) by mouth daily. Take 2 tablets stat, then 1 tablet daily until finished. 04/17/19   Edrick Kins, DPM  erythromycin ophthalmic ointment Place 1 application into both eyes 3 (three) times daily. About 1/4 inch 09/21/19   Caryn Section Linden Dolin, PA-C  furosemide (LASIX) 40 MG tablet Take 40 mg by mouth daily.    [provider]  gentamicin cream (GARAMYCIN) 0.1 % Apply 1 application topically 2 (two) times daily. 06/27/18   Edrick Kins, DPM  HUMALOG 100 UNIT/ML injection Inject 100 Units into  the skin daily. 11/11/16   [provider]  HYDROcodone-acetaminophen (NORCO/VICODIN) 5-325 MG tablet Take 1 tablet by mouth every 6 (six) hours as needed for moderate pain. 09/21/19   Jayant Kriz, Linden Dolin, PA-C  losartan (COZAAR) 100 MG tablet Take 100 mg by mouth daily.    [provider]  pantoprazole (PROTONIX) 40 MG tablet Take 1 tablet (40 mg total) by mouth daily. 03/21/18 03/21/19  Flora Lipps, MD  Papaverine-Phentolamine 30-1 MG/ML SOLN 0.3 mLs by Intracavernosal route as directed. 04/18/18   Stoioff, Ronda Fairly, MD    Allergies Oxycontin [oxycodone hcl], Penicillins, Prednisone, and Shellfish-derived products  Family History  Problem Relation Age of Onset  . Hypertension Other   . Coronary artery disease Other        Mother's side - both her side's grandparents died of heart disease (MIs)  . Diabetes Other   . Stroke Other        Paternal grandfather (60)  . Prostate cancer Neg Hx   . Bladder Cancer Neg Hx   . Kidney cancer Neg Hx     Social History Social History   Tobacco Use  . Smoking status: Never Smoker  . Smokeless tobacco: Never Used  Substance Use Topics  . Alcohol use: No  . Drug use: No    Review of Systems  Constitutional: No fever/chills Eyes: No visual changes.  Positive eye injury ENT: No sore throat. Respiratory: Denies cough Genitourinary: Negative for dysuria. Musculoskeletal: Negative for back pain. Skin: Negative for rash.    ____________________________________________   PHYSICAL EXAM:  VITAL SIGNS: ED Triage Vitals  Enc Vitals Group     BP 09/21/19 1627 (!) 208/106     Pulse Rate 09/21/19 1623 92     Resp 09/21/19 1623 18     Temp 09/21/19 1623 99.1 F (37.3 C)     Temp Source 09/21/19 1623 Oral     SpO2 09/21/19 1623 96 %     Weight 09/21/19 1623 234 lb (106.1 kg)     Height 09/21/19 1623 6' (1.829 m)     Head Circumference --      Peak Flow --      Pain Score 09/21/19 1623 7     Pain Loc --      Pain Edu? --       Excl. in [place]? --     Constitutional: Alert and oriented. Well appearing and in no acute distress. Eyes: Conjunctivae are injected bilaterally, no abrasions are noted, PERRL, no visual change.  Head: Atraumatic. Nose: No congestion/rhinnorhea. Mouth/Throat: Mucous membranes are moist.   Neck:  supple no lymphadenopathy noted Cardiovascular: Normal rate, regular rhythm.  Respiratory: Normal respiratory effort.  No retractions,  GU: deferred Musculoskeletal: FROM all extremities, warm and well perfused Neurologic:  Normal speech and language.  Skin:  Skin  is warm, dry and intact. No rash noted. Psychiatric: Mood and affect are normal. Speech and behavior are normal.  ____________________________________________   LABS (all labs ordered are listed, but only abnormal results are displayed)  Labs Reviewed - No data to display ____________________________________________   ____________________________________________  RADIOLOGY    ____________________________________________   PROCEDURES  Procedure(s) performed: No  Procedures    ____________________________________________   INITIAL IMPRESSION / ASSESSMENT AND PLAN / ED COURSE  Pertinent labs & imaging results that were available during my care of the patient were reviewed by me and considered in my medical decision making (see chart for details).   Patient is 48 year old male presents emergency department with eye injury secondary to the true-D ultraviolet light.  See HPI Patient states his Tdap is up-to-date  Physical exam shows a conjunctiva to be injected.  Tetracaine L[place]ato helped relieve pain, fluorescein stain did not show any dye uptake or abrasions.  Explained findings to the patient.  Explained to him that he has photokeratitis.  He will be placed on erythromycin ophthalmic ointment and given pain medication.  He is to follow-up with [facility] on Monday.  He is to call make an appointment.   States he understands and will comply.  Is discharged in stable condition.    [name] was evaluated in Emergency Department on 09/21/2019 for the symptoms described in the history of present illness. He was evaluated in the context of the global COVID-19 pandemic, which necessitated consideration that the patient might be at risk for infection with the SARS-CoV-2 virus that causes COVID-19. Institutional protocols and algorithms that pertain to the evaluation of patients at risk for COVID-19 are in a state of rapid change based on information released by regulatory bodies including the CDC and federal and state organizations. These policies and algorithms were followed during the patient's care in the ED.   As part of my medical decision making, I reviewed the following data within the [place] notes reviewed and incorporated, Old chart reviewed, Notes from prior ED visits and [place] Controlled Substance Database  ____________________________________________   FINAL CLINICAL IMPRESSION(S) / ED DIAGNOSES  Final diagnoses:  Photokeratitis of both eyes      NEW MEDICATIONS STARTED DURING THIS VISIT:  New Prescriptions   ERYTHROMYCIN OPHTHALMIC OINTMENT    Place 1 application into both eyes 3 (three) times daily. About 1/4 inch   HYDROCODONE-ACETAMINOPHEN (NORCO/VICODIN) 5-325 MG TABLET    Take 1 tablet by mouth every 6 (six) hours as needed for moderate pain.     Note:  This document was prepared using Dragon voice recognition software and may include unintentional dictation errors.    Versie Starks, PA-C 09/21/19 1733    Duffy Bruce, MD 09/24/19 [phone]

## 2019-09-21 NOTE — Discharge Instructions (Signed)
Follow-up with [facility] on Monday.  Please call for an appointment.  Use the erythromycin ophthalmic ointment, quarter of an inch at least 3 times daily.  Vicodin for pain not controlled by Tylenol or ibuprofen.  Wear dark glasses when in the sun.  Return to the emergency department if you are worsening.

## 2019-09-21 NOTE — ED Notes (Signed)

## 2019-09-21 NOTE — ED Triage Notes (Signed)
Pt employee here.  Reports looked at the Tru-D light for few seconds.  BP elevated but has not taken his bp meds today and very anxious about feeling in eyes from light.  Pt c/o blurry vision and feeling a gritty feeling in eyes.  Ambulatory.  Does not need UDS per Will with employee health.

## 2019-09-21 NOTE — ED Notes (Signed)
See triage note  Presents with blurred vision and pain to both eyes

## 2019-09-27 ENCOUNTER — Emergency Department (HOSPITAL_COMMUNITY): Payer: 59

## 2019-09-27 ENCOUNTER — Other Ambulatory Visit: Payer: Self-pay

## 2019-09-27 ENCOUNTER — Inpatient Hospital Stay (HOSPITAL_COMMUNITY): Payer: 59

## 2019-09-27 ENCOUNTER — Inpatient Hospital Stay (HOSPITAL_COMMUNITY)
Admission: EM | Admit: 2019-09-27 | Discharge: 2019-10-03 | DRG: 638 | Disposition: A | Discharge status: Home / Self Care | Payer: 59 | Attending: Internal Medicine | Admitting: Internal Medicine

## 2019-09-27 ENCOUNTER — Encounter (HOSPITAL_COMMUNITY): Payer: Self-pay

## 2019-09-27 DIAGNOSIS — N184 Chronic kidney disease, stage 4 (severe): Secondary | ICD-10-CM | POA: Diagnosis present

## 2019-09-27 DIAGNOSIS — E860 Dehydration: Secondary | ICD-10-CM | POA: Diagnosis not present

## 2019-09-27 DIAGNOSIS — Z91128 Patient's intentional underdosing of medication regimen for other reason: Secondary | ICD-10-CM

## 2019-09-27 DIAGNOSIS — G4733 Obstructive sleep apnea (adult) (pediatric): Secondary | ICD-10-CM | POA: Diagnosis present

## 2019-09-27 DIAGNOSIS — K3184 Gastroparesis: Secondary | ICD-10-CM | POA: Diagnosis present

## 2019-09-27 DIAGNOSIS — T383X6A Underdosing of insulin and oral hypoglycemic [antidiabetic] drugs, initial encounter: Secondary | ICD-10-CM | POA: Diagnosis present

## 2019-09-27 DIAGNOSIS — D649 Anemia, unspecified: Secondary | ICD-10-CM | POA: Diagnosis not present

## 2019-09-27 DIAGNOSIS — N183 Chronic kidney disease, stage 3 unspecified: Secondary | ICD-10-CM | POA: Diagnosis not present

## 2019-09-27 DIAGNOSIS — E669 Obesity, unspecified: Secondary | ICD-10-CM | POA: Diagnosis present

## 2019-09-27 DIAGNOSIS — Z20828 Contact with and (suspected) exposure to other viral communicable diseases: Secondary | ICD-10-CM | POA: Diagnosis present

## 2019-09-27 DIAGNOSIS — K449 Diaphragmatic hernia without obstruction or gangrene: Secondary | ICD-10-CM | POA: Diagnosis present

## 2019-09-27 DIAGNOSIS — R531 Weakness: Secondary | ICD-10-CM | POA: Diagnosis not present

## 2019-09-27 DIAGNOSIS — N179 Acute kidney failure, unspecified: Secondary | ICD-10-CM | POA: Diagnosis present

## 2019-09-27 DIAGNOSIS — N182 Chronic kidney disease, stage 2 (mild): Secondary | ICD-10-CM | POA: Diagnosis not present

## 2019-09-27 DIAGNOSIS — I5189 Other ill-defined heart diseases: Secondary | ICD-10-CM | POA: Diagnosis present

## 2019-09-27 DIAGNOSIS — R000 Tachycardia, unspecified: Secondary | ICD-10-CM | POA: Diagnosis not present

## 2019-09-27 DIAGNOSIS — I169 Hypertensive crisis, unspecified: Secondary | ICD-10-CM | POA: Diagnosis not present

## 2019-09-27 DIAGNOSIS — K269 Duodenal ulcer, unspecified as acute or chronic, without hemorrhage or perforation: Secondary | ICD-10-CM | POA: Diagnosis present

## 2019-09-27 DIAGNOSIS — Y92009 Unspecified place in unspecified non-institutional (private) residence as the place of occurrence of the external cause: Secondary | ICD-10-CM | POA: Diagnosis not present

## 2019-09-27 DIAGNOSIS — E1010 Type 1 diabetes mellitus with ketoacidosis without coma: Principal | ICD-10-CM | POA: Diagnosis present

## 2019-09-27 DIAGNOSIS — I10 Essential (primary) hypertension: Secondary | ICD-10-CM | POA: Diagnosis not present

## 2019-09-27 DIAGNOSIS — E7800 Pure hypercholesterolemia, unspecified: Secondary | ICD-10-CM | POA: Diagnosis present

## 2019-09-27 DIAGNOSIS — E10319 Type 1 diabetes mellitus with unspecified diabetic retinopathy without macular edema: Secondary | ICD-10-CM | POA: Diagnosis present

## 2019-09-27 DIAGNOSIS — R1314 Dysphagia, pharyngoesophageal phase: Secondary | ICD-10-CM | POA: Diagnosis present

## 2019-09-27 DIAGNOSIS — D72829 Elevated white blood cell count, unspecified: Secondary | ICD-10-CM | POA: Diagnosis present

## 2019-09-27 DIAGNOSIS — Z6835 Body mass index (BMI) 35.0-35.9, adult: Secondary | ICD-10-CM | POA: Diagnosis not present

## 2019-09-27 DIAGNOSIS — Z981 Arthrodesis status: Secondary | ICD-10-CM

## 2019-09-27 DIAGNOSIS — I129 Hypertensive chronic kidney disease with stage 1 through stage 4 chronic kidney disease, or unspecified chronic kidney disease: Secondary | ICD-10-CM | POA: Diagnosis present

## 2019-09-27 DIAGNOSIS — R1310 Dysphagia, unspecified: Secondary | ICD-10-CM | POA: Diagnosis not present

## 2019-09-27 DIAGNOSIS — Z9641 Presence of insulin pump (external) (internal): Secondary | ICD-10-CM | POA: Diagnosis present

## 2019-09-27 DIAGNOSIS — R410 Disorientation, unspecified: Secondary | ICD-10-CM | POA: Diagnosis not present

## 2019-09-27 DIAGNOSIS — K2210 Ulcer of esophagus without bleeding: Secondary | ICD-10-CM | POA: Diagnosis not present

## 2019-09-27 DIAGNOSIS — D539 Nutritional anemia, unspecified: Secondary | ICD-10-CM | POA: Diagnosis present

## 2019-09-27 DIAGNOSIS — R112 Nausea with vomiting, unspecified: Secondary | ICD-10-CM | POA: Diagnosis not present

## 2019-09-27 DIAGNOSIS — E1065 Type 1 diabetes mellitus with hyperglycemia: Secondary | ICD-10-CM

## 2019-09-27 DIAGNOSIS — E1022 Type 1 diabetes mellitus with diabetic chronic kidney disease: Secondary | ICD-10-CM | POA: Diagnosis present

## 2019-09-27 DIAGNOSIS — E1043 Type 1 diabetes mellitus with diabetic autonomic (poly)neuropathy: Secondary | ICD-10-CM | POA: Diagnosis present

## 2019-09-27 DIAGNOSIS — E1110 Type 2 diabetes mellitus with ketoacidosis without coma: Secondary | ICD-10-CM

## 2019-09-27 DIAGNOSIS — E875 Hyperkalemia: Secondary | ICD-10-CM | POA: Diagnosis not present

## 2019-09-27 DIAGNOSIS — R1311 Dysphagia, oral phase: Secondary | ICD-10-CM | POA: Diagnosis not present

## 2019-09-27 DIAGNOSIS — N289 Disorder of kidney and ureter, unspecified: Secondary | ICD-10-CM | POA: Diagnosis not present

## 2019-09-27 DIAGNOSIS — Z7982 Long term (current) use of aspirin: Secondary | ICD-10-CM

## 2019-09-27 DIAGNOSIS — R9431 Abnormal electrocardiogram [ECG] [EKG]: Secondary | ICD-10-CM | POA: Diagnosis present

## 2019-09-27 DIAGNOSIS — R1319 Other dysphagia: Secondary | ICD-10-CM

## 2019-09-27 DIAGNOSIS — R404 Transient alteration of awareness: Secondary | ICD-10-CM | POA: Diagnosis not present

## 2019-09-27 DIAGNOSIS — R0689 Other abnormalities of breathing: Secondary | ICD-10-CM | POA: Diagnosis not present

## 2019-09-27 DIAGNOSIS — E1122 Type 2 diabetes mellitus with diabetic chronic kidney disease: Secondary | ICD-10-CM | POA: Diagnosis not present

## 2019-09-27 DIAGNOSIS — E785 Hyperlipidemia, unspecified: Secondary | ICD-10-CM | POA: Diagnosis not present

## 2019-09-27 DIAGNOSIS — K219 Gastro-esophageal reflux disease without esophagitis: Secondary | ICD-10-CM | POA: Diagnosis present

## 2019-09-27 DIAGNOSIS — I248 Other forms of acute ischemic heart disease: Secondary | ICD-10-CM | POA: Diagnosis not present

## 2019-09-27 DIAGNOSIS — R778 Other specified abnormalities of plasma proteins: Secondary | ICD-10-CM | POA: Diagnosis not present

## 2019-09-27 DIAGNOSIS — K5900 Constipation, unspecified: Secondary | ICD-10-CM | POA: Diagnosis not present

## 2019-09-27 DIAGNOSIS — E1165 Type 2 diabetes mellitus with hyperglycemia: Secondary | ICD-10-CM | POA: Diagnosis not present

## 2019-09-27 HISTORY — DX: Transient cerebral ischemic attack, unspecified: G45.9

## 2019-09-27 LAB — BASIC METABOLIC PANEL
Anion gap: 31 — ABNORMAL HIGH (ref 5–15)
Anion gap: 22 — ABNORMAL HIGH (ref 5–15)
Anion gap: 13 (ref 5–15)
Anion gap: 10 (ref 5–15)
Anion gap: 11 (ref 5–15)
BUN: 63 mg/dL — ABNORMAL HIGH (ref 6–20)
BUN: 62 mg/dL — ABNORMAL HIGH (ref 6–20)
BUN: 62 mg/dL — ABNORMAL HIGH (ref 6–20)
BUN: 61 mg/dL — ABNORMAL HIGH (ref 6–20)
BUN: 60 mg/dL — ABNORMAL HIGH (ref 6–20)
BUN: 57 mg/dL — ABNORMAL HIGH (ref 6–20)
CO2: <7 mmol/L — ABNORMAL LOW (ref 22–32)
CO2: 5 mmol/L — ABNORMAL LOW (ref 22–32)
CO2: 12 mmol/L — ABNORMAL LOW (ref 22–32)
CO2: 20 mmol/L — ABNORMAL LOW (ref 22–32)
CO2: 23 mmol/L (ref 22–32)
CO2: 22 mmol/L (ref 22–32)
Calcium: 8.1 mg/dL — ABNORMAL LOW (ref 8.9–10.3)
Calcium: 8 mg/dL — ABNORMAL LOW (ref 8.9–10.3)
Calcium: 8.3 mg/dL — ABNORMAL LOW (ref 8.9–10.3)
Calcium: 8.1 mg/dL — ABNORMAL LOW (ref 8.9–10.3)
Calcium: 8.3 mg/dL — ABNORMAL LOW (ref 8.9–10.3)
Calcium: 8.2 mg/dL — ABNORMAL LOW (ref 8.9–10.3)
Chloride: 86 mmol/L — ABNORMAL LOW (ref 98–111)
Chloride: 90 mmol/L — ABNORMAL LOW (ref 98–111)
Chloride: 97 mmol/L — ABNORMAL LOW (ref 98–111)
Chloride: 101 mmol/L (ref 98–111)
Chloride: 104 mmol/L (ref 98–111)
Chloride: 106 mmol/L (ref 98–111)
Creatinine, Ser: 4.57 mg/dL — ABNORMAL HIGH (ref 0.61–1.24)
Creatinine, Ser: 4.51 mg/dL — ABNORMAL HIGH (ref 0.61–1.24)
Creatinine, Ser: 4.42 mg/dL — ABNORMAL HIGH (ref 0.61–1.24)
Creatinine, Ser: 4.33 mg/dL — ABNORMAL HIGH (ref 0.61–1.24)
Creatinine, Ser: 4.3 mg/dL — ABNORMAL HIGH (ref 0.61–1.24)
Creatinine, Ser: 4.2 mg/dL — ABNORMAL HIGH (ref 0.61–1.24)
GFR calc Af Amer: 16 mL/min — ABNORMAL LOW (ref >60)
GFR calc Af Amer: 17 mL/min — ABNORMAL LOW (ref >60)
GFR calc Af Amer: 17 mL/min — ABNORMAL LOW (ref >60)
GFR calc Af Amer: 18 mL/min — ABNORMAL LOW (ref >60)
GFR calc Af Amer: 18 mL/min — ABNORMAL LOW (ref >60)
GFR calc Af Amer: 18 mL/min — ABNORMAL LOW (ref >60)
GFR calc non Af Amer: 14 mL/min — ABNORMAL LOW (ref >60)
GFR calc non Af Amer: 14 mL/min — ABNORMAL LOW (ref >60)
GFR calc non Af Amer: 15 mL/min — ABNORMAL LOW (ref >60)
GFR calc non Af Amer: 15 mL/min — ABNORMAL LOW (ref >60)
GFR calc non Af Amer: 15 mL/min — ABNORMAL LOW (ref >60)
GFR calc non Af Amer: 16 mL/min — ABNORMAL LOW (ref >60)
Glucose, Bld: 1104 mg/dL (ref 70–99)
Glucose, Bld: 1035 mg/dL (ref 70–99)
Glucose, Bld: 736 mg/dL (ref 70–99)
Glucose, Bld: 532 mg/dL (ref 70–99)
Glucose, Bld: 290 mg/dL — ABNORMAL HIGH (ref 70–99)
Glucose, Bld: 100 mg/dL — ABNORMAL HIGH (ref 70–99)
Potassium: 7.2 mmol/L (ref 3.5–5.1)
Potassium: 7.2 mmol/L (ref 3.5–5.1)
Potassium: 4.5 mmol/L (ref 3.5–5.1)
Potassium: 4 mmol/L (ref 3.5–5.1)
Potassium: 3.6 mmol/L (ref 3.5–5.1)
Potassium: 4.3 mmol/L (ref 3.5–5.1)
Sodium: 123 mmol/L — ABNORMAL LOW (ref 135–145)
Sodium: 126 mmol/L — ABNORMAL LOW (ref 135–145)
Sodium: 131 mmol/L — ABNORMAL LOW (ref 135–145)
Sodium: 134 mmol/L — ABNORMAL LOW (ref 135–145)
Sodium: 137 mmol/L (ref 135–145)
Sodium: 139 mmol/L (ref 135–145)

## 2019-09-27 LAB — I-STAT CHEM 8, ED
BUN: 57 mg/dL — ABNORMAL HIGH (ref 6–20)
Calcium, Ion: 1.02 mmol/L — ABNORMAL LOW (ref 1.15–1.40)
Chloride: 94 mmol/L — ABNORMAL LOW (ref 98–111)
Creatinine, Ser: 4.2 mg/dL — ABNORMAL HIGH (ref 0.61–1.24)
Glucose, Bld: >700 mg/dL (ref 70–99)
HCT: 39 % (ref 39.0–52.0)
Hemoglobin: 13.3 g/dL (ref 13.0–17.0)
Potassium: 7.1 mmol/L (ref 3.5–5.1)
Sodium: 120 mmol/L — ABNORMAL LOW (ref 135–145)
TCO2: 8 mmol/L — ABNORMAL LOW (ref 22–32)

## 2019-09-27 LAB — MRSA PCR SCREENING: MRSA by PCR: NEGATIVE

## 2019-09-27 LAB — BLOOD GAS, VENOUS
Acid-base deficit: 24 mmol/L — ABNORMAL HIGH (ref 0.0–2.0)
Acid-base deficit: 25.7 mmol/L — ABNORMAL HIGH (ref 0.0–2.0)
Acid-base deficit: 12.3 mmol/L — ABNORMAL HIGH (ref 0.0–2.0)
Bicarbonate: 5.9 mmol/L — ABNORMAL LOW (ref 20.0–28.0)
Bicarbonate: 5.2 mmol/L — ABNORMAL LOW (ref 20.0–28.0)
Bicarbonate: 14.3 mmol/L — ABNORMAL LOW (ref 20.0–28.0)
O2 Saturation: 76.3 %
O2 Saturation: 79.3 %
O2 Saturation: 65 %
Patient temperature: 98.6
Patient temperature: 98.6
Patient temperature: 98.6
pCO2, Ven: 23 mmHg — ABNORMAL LOW (ref 44.0–60.0)
pCO2, Ven: 23.1 mmHg — ABNORMAL LOW (ref 44.0–60.0)
pCO2, Ven: 35.8 mmHg — ABNORMAL LOW (ref 44.0–60.0)
pH, Ven: 7.036 — CL (ref 7.250–7.430)
pH, Ven: 6.982 — CL (ref 7.250–7.430)
pH, Ven: 7.224 — ABNORMAL LOW (ref 7.250–7.430)
pO2, Ven: 50.5 mmHg — ABNORMAL HIGH (ref 32.0–45.0)
pO2, Ven: 56.3 mmHg — ABNORMAL HIGH (ref 32.0–45.0)
pO2, Ven: 33.4 mmHg (ref 32.0–45.0)

## 2019-09-27 LAB — GLUCOSE, CAPILLARY
Glucose-Capillary: 101 mg/dL — ABNORMAL HIGH (ref 70–99)
Glucose-Capillary: 116 mg/dL — ABNORMAL HIGH (ref 70–99)
Glucose-Capillary: 138 mg/dL — ABNORMAL HIGH (ref 70–99)
Glucose-Capillary: 144 mg/dL — ABNORMAL HIGH (ref 70–99)
Glucose-Capillary: 158 mg/dL — ABNORMAL HIGH (ref 70–99)
Glucose-Capillary: 203 mg/dL — ABNORMAL HIGH (ref 70–99)
Glucose-Capillary: 246 mg/dL — ABNORMAL HIGH (ref 70–99)
Glucose-Capillary: 271 mg/dL — ABNORMAL HIGH (ref 70–99)
Glucose-Capillary: 382 mg/dL — ABNORMAL HIGH (ref 70–99)
Glucose-Capillary: 426 mg/dL — ABNORMAL HIGH (ref 70–99)
Glucose-Capillary: 456 mg/dL — ABNORMAL HIGH (ref 70–99)
Glucose-Capillary: 462 mg/dL — ABNORMAL HIGH (ref 70–99)
Glucose-Capillary: 474 mg/dL — ABNORMAL HIGH (ref 70–99)
Glucose-Capillary: 480 mg/dL — ABNORMAL HIGH (ref 70–99)
Glucose-Capillary: 563 mg/dL (ref 70–99)
Glucose-Capillary: 583 mg/dL (ref 70–99)
Glucose-Capillary: 91 mg/dL (ref 70–99)
Glucose-Capillary: >600 mg/dL (ref 70–99)
Glucose-Capillary: >600 mg/dL (ref 70–99)
Glucose-Capillary: >600 mg/dL (ref 70–99)

## 2019-09-27 LAB — CBC WITH DIFFERENTIAL/PLATELET
Abs Immature Granulocytes: 0.42 10*3/uL — ABNORMAL HIGH (ref 0.00–0.07)
Basophils Absolute: 0.1 10*3/uL (ref 0.0–0.1)
Basophils Relative: 1 %
Eosinophils Absolute: 0.1 10*3/uL (ref 0.0–0.5)
Eosinophils Relative: 1 %
HCT: 41.9 % (ref 39.0–52.0)
Hemoglobin: 11.8 g/dL — ABNORMAL LOW (ref 13.0–17.0)
Immature Granulocytes: 3 %
Lymphocytes Relative: 12 %
Lymphs Abs: 1.8 10*3/uL (ref 0.7–4.0)
MCH: 28.8 pg (ref 26.0–34.0)
MCHC: 28.2 g/dL — ABNORMAL LOW (ref 30.0–36.0)
MCV: 102.2 fL — ABNORMAL HIGH (ref 80.0–100.0)
Monocytes Absolute: 1.3 10*3/uL — ABNORMAL HIGH (ref 0.1–1.0)
Monocytes Relative: 9 %
Neutro Abs: 11.4 10*3/uL — ABNORMAL HIGH (ref 1.7–7.7)
Neutrophils Relative %: 74 %
Platelets: 305 10*3/uL (ref 150–400)
RBC: 4.1 MIL/uL — ABNORMAL LOW (ref 4.22–5.81)
RDW: 13.5 % (ref 11.5–15.5)
WBC: 15.1 10*3/uL — ABNORMAL HIGH (ref 4.0–10.5)
nRBC: 0 % (ref 0.0–0.2)

## 2019-09-27 LAB — CBG MONITORING, ED
Glucose-Capillary: >600 mg/dL (ref 70–99)
Glucose-Capillary: >600 mg/dL (ref 70–99)
Glucose-Capillary: >600 mg/dL (ref 70–99)
Glucose-Capillary: >600 mg/dL (ref 70–99)
Glucose-Capillary: >600 mg/dL (ref 70–99)

## 2019-09-27 LAB — BETA-HYDROXYBUTYRIC ACID
Beta-Hydroxybutyric Acid: >8 mmol/L — ABNORMAL HIGH (ref 0.05–0.27)
Beta-Hydroxybutyric Acid: >8 mmol/L — ABNORMAL HIGH (ref 0.05–0.27)
Beta-Hydroxybutyric Acid: 0.07 mmol/L (ref 0.05–0.27)

## 2019-09-27 LAB — TSH: TSH: 2.019 u[IU]/mL (ref 0.350–4.500)

## 2019-09-27 LAB — TROPONIN I (HIGH SENSITIVITY)
Troponin I (High Sensitivity): 2654 ng/L (ref <18)
Troponin I (High Sensitivity): 7715 ng/L (ref <18)
Troponin I (High Sensitivity): 9332 ng/L (ref <18)
Troponin I (High Sensitivity): 10640 ng/L (ref <18)

## 2019-09-27 LAB — HIV ANTIBODY (ROUTINE TESTING W REFLEX): HIV Screen 4th Generation wRfx: NONREACTIVE

## 2019-09-27 LAB — RESPIRATORY PANEL BY RT PCR (FLU A&B, COVID)
Influenza A by PCR: NEGATIVE
Influenza B by PCR: NEGATIVE
SARS Coronavirus 2 by RT PCR: NEGATIVE

## 2019-09-27 LAB — ECHOCARDIOGRAM COMPLETE
Height: 69 in
Weight: 3844.82 oz

## 2019-09-27 MED ORDER — LACTATED RINGERS IV SOLN: INTRAVENOUS | Status: DC

## 2019-09-27 MED ORDER — SODIUM BICARBONATE 8.4 % IV SOLN
50.0000 meq | Freq: Once | INTRAVENOUS | Status: AC
  Administered 2019-09-27: 50 meq via INTRAVENOUS
  Filled 2019-09-27: qty 50

## 2019-09-27 MED ORDER — DEXTROSE-NACL 5-0.45 % IV SOLN: INTRAVENOUS | Status: DC

## 2019-09-27 MED ORDER — POTASSIUM CHLORIDE 10 MEQ/100ML IV SOLN
10.0000 meq | INTRAVENOUS | Status: AC
  Administered 2019-09-27 (×4): 10 meq via INTRAVENOUS
  Filled 2019-09-27 (×4): qty 100

## 2019-09-27 MED ORDER — ONDANSETRON HCL 4 MG/2ML IJ SOLN
4.0000 mg | Freq: Four times a day (QID) | INTRAMUSCULAR | Status: DC | PRN
  Administered 2019-09-27 – 2019-09-28 (×2): 4 mg via INTRAVENOUS
  Filled 2019-09-27 (×3): qty 2

## 2019-09-27 MED ORDER — HEPARIN (PORCINE) 25000 UT/250ML-% IV SOLN
1200.0000 [IU]/h | INTRAVENOUS | Status: DC
  Administered 2019-09-27: 1200 [IU]/h via INTRAVENOUS
  Filled 2019-09-27: qty 250

## 2019-09-27 MED ORDER — SODIUM CHLORIDE 0.9 % IV SOLN
1.0000 g | Freq: Once | INTRAVENOUS | Status: AC
  Administered 2019-09-27: 1 g via INTRAVENOUS
  Filled 2019-09-27: qty 10

## 2019-09-27 MED ORDER — SODIUM BICARBONATE 8.4 % IV SOLN
INTRAVENOUS | Status: AC
  Filled 2019-09-27: qty 50

## 2019-09-27 MED ORDER — ACETAMINOPHEN 325 MG PO TABS
650.0000 mg | ORAL_TABLET | Freq: Four times a day (QID) | ORAL | Status: DC | PRN
  Administered 2019-09-27 – 2019-10-02 (×10): 650 mg via ORAL
  Filled 2019-09-27 (×10): qty 2

## 2019-09-27 MED ORDER — CALCIUM GLUCONATE-NACL 1-0.675 GM/50ML-% IV SOLN
INTRAVENOUS | Status: AC
  Filled 2019-09-27: qty 50

## 2019-09-27 MED ORDER — ONDANSETRON HCL 4 MG/2ML IJ SOLN
4.0000 mg | Freq: Once | INTRAMUSCULAR | Status: AC
  Administered 2019-09-27: 4 mg via INTRAVENOUS
  Filled 2019-09-27: qty 2

## 2019-09-27 MED ORDER — CARVEDILOL 3.125 MG PO TABS
3.1250 mg | ORAL_TABLET | Freq: Two times a day (BID) | ORAL | Status: DC
  Administered 2019-09-27 – 2019-09-29 (×6): 3.125 mg via ORAL
  Filled 2019-09-27 (×6): qty 1

## 2019-09-27 MED ORDER — DEXTROSE 50 % IV SOLN: 0.0000 mL | INTRAVENOUS | Status: DC | PRN

## 2019-09-27 MED ORDER — CHLORHEXIDINE GLUCONATE CLOTH 2 % EX PADS
6.0000 | MEDICATED_PAD | Freq: Every day | CUTANEOUS | Status: DC
  Administered 2019-09-28 – 2019-10-01 (×4): 6 via TOPICAL

## 2019-09-27 MED ORDER — SODIUM CHLORIDE 0.9 % IV BOLUS
20.0000 mL/kg | Freq: Once | INTRAVENOUS | Status: AC
  Administered 2019-09-27: 2122 mL via INTRAVENOUS

## 2019-09-27 MED ORDER — SODIUM CHLORIDE 0.9 % IV SOLN: INTRAVENOUS | Status: DC

## 2019-09-27 MED ORDER — ATORVASTATIN CALCIUM 40 MG PO TABS
80.0000 mg | ORAL_TABLET | Freq: Every day | ORAL | Status: DC
  Administered 2019-09-27 – 2019-10-02 (×6): 80 mg via ORAL
  Filled 2019-09-27 (×6): qty 2

## 2019-09-27 MED ORDER — ASPIRIN 325 MG PO TABS
325.0000 mg | ORAL_TABLET | Freq: Once | ORAL | Status: AC
  Administered 2019-09-27: 325 mg via ORAL
  Filled 2019-09-27: qty 1

## 2019-09-27 MED ORDER — ADULT MULTIVITAMIN W/MINERALS CH
1.0000 | ORAL_TABLET | Freq: Every day | ORAL | Status: DC
  Administered 2019-09-28 – 2019-10-03 (×5): 1 via ORAL
  Filled 2019-09-27 (×6): qty 1

## 2019-09-27 MED ORDER — HEPARIN BOLUS VIA INFUSION
2500.0000 [IU] | Freq: Once | INTRAVENOUS | Status: AC
  Administered 2019-09-27: 2500 [IU] via INTRAVENOUS
  Filled 2019-09-27: qty 2500

## 2019-09-27 MED ORDER — SODIUM BICARBONATE 8.4 % IV SOLN
50.0000 meq | Freq: Once | INTRAVENOUS | Status: AC
  Administered 2019-09-27: 50 meq via INTRAVENOUS

## 2019-09-27 MED ORDER — OXYCODONE HCL 5 MG PO TABS
5.0000 mg | ORAL_TABLET | Freq: Once | ORAL | Status: AC
  Administered 2019-09-27: 5 mg via ORAL
  Filled 2019-09-27: qty 1

## 2019-09-27 MED ORDER — HEPARIN SODIUM (PORCINE) 5000 UNIT/ML IJ SOLN
5000.0000 [IU] | Freq: Three times a day (TID) | INTRAMUSCULAR | Status: DC
  Administered 2019-09-27: 5000 [IU] via SUBCUTANEOUS
  Filled 2019-09-27: qty 1

## 2019-09-27 MED ORDER — INSULIN REGULAR(HUMAN) IN NACL 100-0.9 UT/100ML-% IV SOLN
INTRAVENOUS | Status: DC
  Administered 2019-09-27: 8 [IU]/h via INTRAVENOUS
  Administered 2019-09-27: 17 [IU]/h via INTRAVENOUS
  Filled 2019-09-27 (×2): qty 100

## 2019-09-27 MED ORDER — ONDANSETRON HCL 4 MG/2ML IJ SOLN
4.0000 mg | Freq: Four times a day (QID) | INTRAMUSCULAR | Status: DC
  Administered 2019-09-27: 4 mg via INTRAVENOUS
  Filled 2019-09-27: qty 2

## 2019-09-27 MED ORDER — PHENOL 1.4 % MT LIQD
1.0000 | OROMUCOSAL | Status: DC | PRN
  Administered 2019-09-28: 1 via OROMUCOSAL
  Filled 2019-09-27: qty 177

## 2019-09-27 MED ORDER — ORAL CARE MOUTH RINSE
15.0000 mL | Freq: Two times a day (BID) | OROMUCOSAL | Status: DC
  Administered 2019-09-27 – 2019-10-01 (×4): 15 mL via OROMUCOSAL

## 2019-09-27 MED ORDER — PROMETHAZINE HCL 25 MG/ML IJ SOLN
6.2500 mg | Freq: Four times a day (QID) | INTRAMUSCULAR | Status: DC | PRN
  Administered 2019-09-28 – 2019-09-30 (×3): 6.25 mg via INTRAVENOUS
  Filled 2019-09-27 (×3): qty 1

## 2019-09-27 MED ORDER — LACTATED RINGERS IV BOLUS
1000.0000 mL | Freq: Once | INTRAVENOUS | Status: AC
  Administered 2019-09-27: 1000 mL via INTRAVENOUS

## 2019-09-27 NOTE — ED Notes (Signed)
Date and time results received: 09/27/19 2:41 AM   Test: potassium Critical Value: 7.2  Name of Provider Notified: [place], md

## 2019-09-27 NOTE — ED Triage Notes (Signed)
Per [facility]: Pt coming in c/o increased thirst and abnormal breathing x2 days.  [place] breathing ranging from 7-22. Also c/o blurred vision en route to hospital. Responds to verbal stimuli. Has insulin pump but was not wearing it today.   cbg- high ( >600) normal for him is 100-200 Hr-130 bp 140/82 o2-99  Cap- 17   20 L and R AC  700 of NS given  Lattie Haw [phone] wife

## 2019-09-27 NOTE — Progress Notes (Signed)
CRITICAL VALUE ALERT  Critical Value:  Troponin 10,640  Date & Time Notied:  09/27/19 @ 2115  Provider Notified: E-link  Orders Received/Actions taken: Cardiology to see him beginning of week

## 2019-09-27 NOTE — Progress Notes (Addendum)
ANTICOAGULATION CONSULT NOTE - Initial Consult  Pharmacy Consult for Heparin Indication: chest pain/ACS  Allergies  Allergen Reactions  . Oxycontin [Oxycodone Hcl] Nausea And Vomiting  . Penicillins Other (See Comments)    Possible reaction many years ago per patient  . Prednisone Other (See Comments)    Patient is diabetic, runs sugar up  . Shellfish-Derived Products     Pt is not allergic to iodine, has had iodine in the past w/o premeds and w/ no problems    Patient Measurements: Height: 5\' 9"  (175.3 cm) Weight: 240 lb 4.8 oz (109 kg) IBW/kg (Calculated) : 70.7 HEPARIN DW (KG): 94.6   Vital Signs: Temp: 97.6 F (36.4 C) (12/24 0800) Temp Source: Oral (12/24 0800) BP: 176/85 (12/24 1000) Pulse Rate: 111 (12/24 1000)  Labs: Recent Labs    09/27/19 0200 09/27/19 0209 09/27/19 0312 09/27/19 0842 09/27/19 0849  HGB 11.8* 13.3  --   --   --   HCT 41.9 39.0  --   --   --   PLT 305  --   --   --   --   CREATININE 4.57* 4.20* 4.51*  --  4.42*  TROPONINIHS  --   --   --  2,654*  --     Estimated Creatinine Clearance: 25.1 mL/min (A) (by C-G formula based on SCr of 4.42 mg/dL (H)).   Medical History: Past Medical History:  Diagnosis Date  . CKD (chronic kidney disease), stage II   . Gastroparesis   . Herniated cervical disc   . HTN (hypertension)   . Hypercholesteremia   . S/P cardiac cath    a. 10-15 yrs ago at [facility] due to tachycardia, reportedly normal. b. Normal ETT 03/2012.  Marland Kitchen Sinus tachycardia    a. 24-hr Holter 03/2012 - SR, occ PVCs, no VT, avg HR 96bpm.  . TIA (transient ischemic attack)   . Type 1 diabetes mellitus ([place])    a. With insulin pump.    Medications:  Infusions:  . calcium gluconate in NaCl    . dextrose 5 % and 0.45% NaCl    . insulin 8 Units/hr (09/27/19 0218)  . lactated ringers 150 mL/hr at 09/27/19 0859   -No anticoagulants PTA  Assessment: 48 yo M admitted with DKA.  Now with elevated troponin. Pharmacy consulted to start IV  heparin. Baseline labs- CBC WNL No bleeding noted.  Currently on SQ heparin for VTE px- last dose at 6am  Goal of Therapy:  Heparin level 0.3-0.7 units/ml Monitor platelets by anticoagulation protocol: Yes   Plan:  D/C SQ Heparin Heparin 2500 units IV bolus x1 followed by infusion at 1200 units/hr Check 6h heparin level after infusion starts Daily heparin level & CBC while on heparin Monitor for s/sx of bleeding  Netta Cedars, PharmD, BCPS 09/27/2019,10:15 AM

## 2019-09-27 NOTE — H&P (Addendum)
NAME:  [name], MRN:  [id number], DOB:  August 23, 1971, LOS: 0 ADMISSION DATE:  09/27/2019, CONSULTATION DATE:  09/27/19 REFERRING MD:  Shanon Rosser, MD, CHIEF COMPLAINT:  DKA   Brief History   [name] is a 48 y.o. male with h/o DM1 and CKD4 presenting with DKA after removing his insulin pump for the last few months. Blood sugar >1100 on arrival. Has history of DKA in the past. No infectious symptoms present on arrival.  History of present illness   [name] is a 48 y.o. 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 Brought in by EMS drowsy but alert with apparent Kussmaul breathing. He complained of severe thirst and increased urination. Has not taken any insulin today. The day prior to presentation, he reports that his blood sugars were high (280s). He stopped using his insulin pump some months ago because he had a 35 lb intentional weight loss and reported that his blood sugars have been much improved recently. He does endorse prior history of diabetic ketoacidosis. He denies fever, chills, runny nose, sore throat, cough, chest pain, chest tightness, palpitations, vomiting, diarrhea or dysuria. He endorses polydipsia, polyuria and nausea. Denies alcohol use.  In the ED, he is afebrile, in sinus tachycardia and saturating comfortably on 2L/min via nasal cannula. Labs notable for initial blood glucose of 1104. Corrected sodium 144.7, K 7.2, CO2 5,  [name] 63, Creatinine 4.57 (from 3.02 most recently in October 2020). Beta-hydroxy >8.0. WBC 15.1, Hgb 11.8, plt 305. Initial EKG shows sinus tachycardia with peaked T waves. Initial VBG showed pH 7.036, pCO2 23. He was given 2.1 L NS bolus and started on insulin infusion while in the ED. He was also given sodium bicarb x2 and calcium chloride x1.  Past Medical History  DM1 with retinopathy and nephropathy CKD  stage IV HTN HLD  Significant Hospital Events   N/A  Consults:  PCCM  Procedures:  N/A  Significant Diagnostic Tests:  EKG 12/24: sinus tachycardia, peaked T waves CXR 12/24: persistent left hemidiaphragmatic elevation, no acute findings  Micro Data:  Blood cultures collected  Antimicrobials:  None    Objective   Blood pressure (!) 127/59, pulse (!) 126, temperature (!) 96.9 F (36.1 C), temperature source Axillary, resp. rate 14, SpO2 100 %.       No intake or output data in the 24 hours ending 09/27/19 0509 There were no vitals filed for this visit.  Examination: General: alert, oriented, somewhat drowsy HENT: [place]/AT, dry OMM Lungs: CTA b/l, no w/c/r Cardiovascular: tachycardic, no m/r/g Abdomen: soft, NTND, NABS Extremities: no c/c/e, no rash Neuro: A/Ox3, grossly normal exam GU: Deferred   Assessment & Plan:  1. Severe diabetic ketoacidosis - Aggressive volume resuscitation - IV insulin per protocol - BMP Q4H, SSI Q1H - Repeat VBG; bicarb if pH <7.0 - Check troponin - Obtain U/A - No indication for Abx at this time - Keep NPO  2. AKI on CKD 3. Hyperkalemia with peaked T waves - IV insulin for hyperkalemia - Frequent BMP as above - Peaked T waves improving; will continue to monitor on telemetry - No acute indication for dialysis - Monitor electrolytes, UOP - Place Foley if patient unable to urinate on his own in urinal - Avoid nephrotoxins - Dose meds for CrCl <30  Chronic medical conditions - HTN: hold home antihypertensives - HLD: continue home Lipitor  Best  practice:  Diet: NPO Pain/Anxiety/Delirium protocol (if indicated): N/A VAP protocol (if indicated): N/A DVT prophylaxis: SQH GI prophylaxis: N/A Glucose control: IV insulin Mobility: Bed rest Code Status: Full Code Family Communication: None Disposition: ICU  Labs   CBC: Recent Labs  Lab 09/27/19 0200 09/27/19 0209  WBC 15.1*  --   NEUTROABS 11.4*  --   HGB 11.8* 13.3   HCT 41.9 39.0  MCV 102.2*  --   PLT 305  --     Basic Metabolic Panel: Recent Labs  Lab 09/27/19 0200 09/27/19 0209 09/27/19 0312  NA 123* 120* 126*  K 7.2* 7.1* 7.2*  CL 86* 94* 90*  CO2 <7*  --  5*  GLUCOSE 1,104* >700* 1,035*  [name] 63* 57* 62*  CREATININE 4.57* 4.20* 4.51*  CALCIUM 8.1*  --  8.0*   GFR: Estimated Creatinine Clearance: 25.5 mL/min (A) (by C-G formula based on SCr of 4.51 mg/dL (H)). Recent Labs  Lab 09/27/19 0200  WBC 15.1*    Liver Function Tests: No results for input(s): AST, ALT, ALKPHOS, BILITOT, PROT, ALBUMIN in the last 168 hours. No results for input(s): LIPASE, AMYLASE in the last 168 hours. No results for input(s): AMMONIA in the last 168 hours.  ABG    Component Value Date/Time   HCO3 5.2 (L) 09/27/2019 0312   TCO2 8 (L) 09/27/2019 0209   ACIDBASEDEF 25.7 (H) 09/27/2019 0312   O2SAT 79.3 09/27/2019 0312     Coagulation Profile: No results for input(s): INR, PROTIME in the last 168 hours.  Cardiac Enzymes: No results for input(s): CKTOTAL, CKMB, CKMBINDEX, TROPONINI in the last 168 hours.  HbA1C: Hgb A1c MFr Bld  Date/Time Value Ref Range Status  08/29/2018 04:31 AM 10.9 (H) 4.8 - 5.6 % Final    Comment:    (NOTE) Pre diabetes:          5.7%-6.4% Diabetes:              >6.4% Glycemic control for   <7.0% adults with diabetes   08/28/2018 07:05 PM 10.9 (H) 4.8 - 5.6 % Final    Comment:    (NOTE) Pre diabetes:          5.7%-6.4% Diabetes:              >6.4% Glycemic control for   <7.0% adults with diabetes     CBG: Recent Labs  Lab 09/27/19 0154 09/27/19 0252 09/27/19 0326 09/27/19 0411 09/27/19 0502  GLUCAP >600* >600* >600* >600* >600*    Review of Systems:   Pertinent findings noted in HPI. All other systems reviewed and negative if not otherwise documented.  Past Medical History  He,  has a past medical history of CKD (chronic kidney disease), stage II, Gastroparesis, Herniated cervical disc, HTN  (hypertension), Hypercholesteremia, S/P cardiac cath, Sinus tachycardia, and Type 1 diabetes mellitus ([place]).   Surgical History    Past Surgical History:  Procedure Laterality Date  . cervical neck fusion    . HERNIA REPAIR     bilateral  . LEFT HEART CATHETERIZATION WITH CORONARY ANGIOGRAM N/A 12/18/2013   Procedure: LEFT HEART CATHETERIZATION WITH CORONARY ANGIOGRAM;  Surgeon: [name] M Martinique, MD;  Location: [facility] CATH LAB;  Service: Cardiovascular;  Laterality: N/A;     Social History   reports that he has never smoked. He has never used smokeless tobacco. He reports that he does not drink alcohol or use drugs.   Family History   His family history includes Coronary artery disease  in an other family member; Diabetes in an other family member; Hypertension in an other family member; Stroke in an other family member. There is no history of Prostate cancer, Bladder Cancer, or Kidney cancer.   Allergies Allergies  Allergen Reactions  . Oxycontin [Oxycodone Hcl] Nausea And Vomiting  . Penicillins Other (See Comments)    Possible reaction many years ago per patient  . Prednisone Other (See Comments)    Patient is diabetic, runs sugar up  . Shellfish-Derived Products     Pt is not allergic to iodine, has had iodine in the past w/o premeds and w/ no problems     Home Medications  Prior to Admission medications   Medication Sig Start Date End Date Taking? Authorizing Provider  atorvastatin (LIPITOR) 80 MG tablet Take 80 mg by mouth daily.   Yes [provider]  carvedilol (COREG) 3.125 MG tablet Take 3.125 mg by mouth 2 (two) times daily.   Yes [provider]  HUMALOG 100 UNIT/ML injection Inject 100 Units into the skin daily. 11/11/16  Yes [provider]  HYDROcodone-acetaminophen (NORCO/VICODIN) 5-325 MG tablet Take 1 tablet by mouth every 6 (six) hours as needed for moderate pain. 09/21/19  Yes Fisher, Linden Dolin, PA-C  losartan (COZAAR) 100 MG tablet Take 100 mg  by mouth daily.   Yes [provider]  amLODipine (NORVASC) 5 MG tablet Take 1 tablet (5 mg total) by mouth daily. Patient not taking: Reported on 09/27/2019 03/31/18   Wellington Hampshire, MD  aspirin EC 81 MG tablet Take 1 tablet (81 mg total) by mouth daily. Patient not taking: Reported on 09/27/2019 08/29/18   Salary, Avel Peace, MD  BAYER CONTOUR NEXT TEST test strip Inject 1 strip into the skin 4 (four) times daily as needed. 12/23/16   [provider]  budesonide-formoterol (SYMBICORT) 160-4.5 MCG/ACT inhaler Inhale 2 puffs into the lungs 2 (two) times daily. Patient not taking: Reported on 09/27/2019 03/21/18   Flora Lipps, MD  colchicine 0.6 MG tablet Take 1 tablet (0.6 mg total) by mouth daily. Take 2 tablets stat, then 1 tablet daily until finished. Patient not taking: Reported on 09/27/2019 04/17/19   Edrick Kins, DPM  erythromycin ophthalmic ointment Place 1 application into both eyes 3 (three) times daily. About 1/4 inch Patient not taking: Reported on 09/27/2019 09/21/19   Versie Starks, PA-C  gentamicin cream (GARAMYCIN) 0.1 % Apply 1 application topically 2 (two) times daily. Patient not taking: Reported on 09/27/2019 06/27/18   Edrick Kins, DPM  pantoprazole (PROTONIX) 40 MG tablet Take 1 tablet (40 mg total) by mouth daily. 03/21/18 03/21/19  Flora Lipps, MD  Papaverine-Phentolamine 30-1 MG/ML SOLN 0.3 mLs by Intracavernosal route as directed. Patient not taking: Reported on 09/27/2019 04/18/18   Abbie Sons, MD     Critical care time: 50 minutes

## 2019-09-27 NOTE — ED Provider Notes (Signed)
[facility] DEPT Provider Note: Matthew Spurling, MD, FACEP   CSN: [id number] MRN: [id number] ARRIVAL: 09/27/19 at [place]: [place]  Hyperglycemia   HISTORY OF PRESENT ILLNESS  09/27/19 1:56 AM [name] is a 48 y.o. male with type 1 diabetes and a history of diabetic ketoacidosis.  He is here with a 2-day history of increased thirst, increased urination and rapid breathing.  He has not used his insulin pump in over 6 months and states he has been well managed with insulin injections.  He does not know what triggered his current illness.  EMS reports [place] respirations prior to arrival.  Patient is lethargic but will arouse to verbal stimuli and answer questions.  Sugar was noted to be "high" (greater than 600) prior to arrival.  He was given 700 mL of normal saline IV prior to arrival.  He denies abdominal pain but has been nauseated.  He states he feels very tired.   Past Medical History:  Diagnosis Date  . CKD (chronic kidney disease), stage II   . Gastroparesis   . Herniated cervical disc   . HTN (hypertension)   . Hypercholesteremia   . S/P cardiac cath    a. 10-15 yrs ago at [facility] due to tachycardia, reportedly normal. b. Normal ETT 03/2012.  Marland Kitchen Sinus tachycardia    a. 24-hr Holter 03/2012 - SR, occ PVCs, no VT, avg HR 96bpm.  . Type 1 diabetes mellitus ([place])    a. With insulin pump.    Past Surgical History:  Procedure Laterality Date  . cervical neck fusion    . HERNIA REPAIR     bilateral  . LEFT HEART CATHETERIZATION WITH CORONARY ANGIOGRAM N/A 12/18/2013   Procedure: LEFT HEART CATHETERIZATION WITH CORONARY ANGIOGRAM;  Surgeon: [name] M Martinique, MD;  Location: [facility] CATH LAB;  Service: Cardiovascular;  Laterality: N/A;    Family History  Problem Relation Age of Onset  . Hypertension Other   . Coronary artery disease Other        Mother's side - both her side's grandparents died of heart disease (MIs)  . Diabetes Other   . Stroke Other         Paternal grandfather (65)  . Prostate cancer Neg Hx   . Bladder Cancer Neg Hx   . Kidney cancer Neg Hx     Social History   Tobacco Use  . Smoking status: Never Smoker  . Smokeless tobacco: Never Used  Substance Use Topics  . Alcohol use: No  . Drug use: No    Prior to Admission medications   Medication Sig Start Date End Date Taking? Authorizing Provider  atorvastatin (LIPITOR) 80 MG tablet Take 80 mg by mouth daily.   Yes [provider]  carvedilol (COREG) 3.125 MG tablet Take 3.125 mg by mouth 2 (two) times daily.   Yes [provider]  HUMALOG 100 UNIT/ML injection Inject 100 Units into the skin daily. 11/11/16  Yes [provider]  HYDROcodone-acetaminophen (NORCO/VICODIN) 5-325 MG tablet Take 1 tablet by mouth every 6 (six) hours as needed for moderate pain. 09/21/19  Yes Fisher, Linden Dolin, PA-C  losartan (COZAAR) 100 MG tablet Take 100 mg by mouth daily.   Yes [provider]  amLODipine (NORVASC) 5 MG tablet Take 1 tablet (5 mg total) by mouth daily. Patient not taking: Reported on 09/27/2019 03/31/18   Wellington Hampshire, MD  aspirin EC 81 MG tablet Take 1 tablet (81 mg total) by  mouth daily. Patient not taking: Reported on 09/27/2019 08/29/18   Salary, Avel Peace, MD  BAYER CONTOUR NEXT TEST test strip Inject 1 strip into the skin 4 (four) times daily as needed. 12/23/16   [provider]  budesonide-formoterol (SYMBICORT) 160-4.5 MCG/ACT inhaler Inhale 2 puffs into the lungs 2 (two) times daily. Patient not taking: Reported on 09/27/2019 03/21/18   Flora Lipps, MD  colchicine 0.6 MG tablet Take 1 tablet (0.6 mg total) by mouth daily. Take 2 tablets stat, then 1 tablet daily until finished. Patient not taking: Reported on 09/27/2019 04/17/19   Edrick Kins, DPM  erythromycin ophthalmic ointment Place 1 application into both eyes 3 (three) times daily. About 1/4 inch Patient not taking: Reported on 09/27/2019 09/21/19   Versie Starks, PA-C  gentamicin cream (GARAMYCIN) 0.1 % Apply 1 application topically 2 (two) times daily. Patient not taking: Reported on 09/27/2019 06/27/18   Edrick Kins, DPM  pantoprazole (PROTONIX) 40 MG tablet Take 1 tablet (40 mg total) by mouth daily. 03/21/18 03/21/19  Flora Lipps, MD  Papaverine-Phentolamine 30-1 MG/ML SOLN 0.3 mLs by Intracavernosal route as directed. Patient not taking: Reported on 09/27/2019 04/18/18   Abbie Sons, MD    Allergies Oxycontin [oxycodone hcl], Penicillins, Prednisone, and Shellfish-derived products   REVIEW OF SYSTEMS  Negative except as noted here or in the History of Present Illness.   PHYSICAL EXAMINATION  Initial Vital Signs Blood pressure 124/66, pulse (!) 138, temperature (!) 96.9 F (36.1 C), temperature source Axillary, resp. rate (!) 26, SpO2 96 %.  Examination General: Well-developed, well-nourished male in no acute distress; appearance consistent with age of record HENT: normocephalic; atraumatic Eyes: pupils equal, round and reactive to light; extraocular muscles intact Neck: supple Heart: regular rate and rhythm; tachycardia Lungs: clear to auscultation bilaterally; tachypneic Abdomen: soft; nondistended; nontender; bowel sounds present Extremities: No deformity; full range of motion; pulses normal Neurologic: Somnolent but readily awakened; noted to move all extremities; no facial droop Skin: Warm and dry; pale Psychiatric: Flat affect   RESULTS  Summary of this visit's results, reviewed and interpreted by myself:   EKG Interpretation  Date/Time:  Thursday September 27 2019 01:50:29 EST Ventricular Rate:  126 PR Interval:    QRS Duration: 119 QT Interval:  352 QTC Calculation: 510 R Axis:   81 Text Interpretation: Sinus tachycardia Nonspecific intraventricular conduction delay Rate is faster [place] Ts Confirmed by Shanon Rosser [phone]) on 09/27/2019 1:57:03 AM      Laboratory Studies: Results for orders  placed or performed during the hospital encounter of 09/27/19 (from the past 24 hour(s))  CBG monitoring, ED     Status: Abnormal   Collection Time: 09/27/19  1:54 AM  Result Value Ref Range   Glucose-Capillary >600 (HH) 70 - 99 mg/dL  Beta-hydroxybutyric acid     Status: Abnormal   Collection Time: 09/27/19  1:56 AM  Result Value Ref Range   Beta-Hydroxybutyric Acid >8.00 (H) 0.05 - 0.27 mmol/L  Basic metabolic panel     Status: Abnormal   Collection Time: 09/27/19  2:00 AM  Result Value Ref Range   Sodium 123 (L) 135 - 145 mmol/L   Potassium 7.2 (HH) 3.5 - 5.1 mmol/L   Chloride 86 (L) 98 - 111 mmol/L   CO2 <7 (L) 22 - 32 mmol/L   Glucose, Bld 1,104 (HH) 70 - 99 mg/dL   BUN 63 (H) 6 - 20 mg/dL   Creatinine, Ser 4.57 (H) 0.61 - 1.24  mg/dL   Calcium 8.1 (L) 8.9 - 10.3 mg/dL   GFR calc non Af Amer 14 (L) >60 mL/min   GFR calc Af Amer 16 (L) >60 mL/min   Anion gap NOT CALCULATED 5 - 15  CBC with Differential     Status: Abnormal   Collection Time: 09/27/19  2:00 AM  Result Value Ref Range   WBC 15.1 (H) 4.0 - 10.5 K/uL   RBC 4.10 (L) 4.22 - 5.81 MIL/uL   Hemoglobin 11.8 (L) 13.0 - 17.0 g/dL   HCT 41.9 39.0 - 52.0 %   MCV 102.2 (H) 80.0 - 100.0 fL   MCH 28.8 26.0 - 34.0 pg   MCHC 28.2 (L) 30.0 - 36.0 g/dL   RDW 13.5 11.5 - 15.5 %   Platelets 305 150 - 400 K/uL   nRBC 0.0 0.0 - 0.2 %   Neutrophils Relative % 74 %   Neutro Abs 11.4 (H) 1.7 - 7.7 K/uL   Lymphocytes Relative 12 %   Lymphs Abs 1.8 0.7 - 4.0 K/uL   Monocytes Relative 9 %   Monocytes Absolute 1.3 (H) 0.1 - 1.0 K/uL   Eosinophils Relative 1 %   Eosinophils Absolute 0.1 0.0 - 0.5 K/uL   Basophils Relative 1 %   Basophils Absolute 0.1 0.0 - 0.1 K/uL   Immature Granulocytes 3 %   Abs Immature Granulocytes 0.42 (H) 0.00 - 0.07 K/uL  Blood gas, venous     Status: Abnormal   Collection Time: 09/27/19  2:00 AM  Result Value Ref Range   pH, Ven 7.036 (LL) 7.250 - 7.430   pCO2, Ven 23.0 (L) 44.0 - 60.0 mmHg   pO2, Ven  50.5 (H) 32.0 - 45.0 mmHg   Bicarbonate 5.9 (L) 20.0 - 28.0 mmol/L   Acid-base deficit 24.0 (H) 0.0 - 2.0 mmol/L   O2 Saturation 76.3 %   Patient temperature 98.6   I-stat chem 8, ED (not at [facility] or [facility])     Status: Abnormal   Collection Time: 09/27/19  2:09 AM  Result Value Ref Range   Sodium 120 (L) 135 - 145 mmol/L   Potassium 7.1 (HH) 3.5 - 5.1 mmol/L   Chloride 94 (L) 98 - 111 mmol/L   BUN 57 (H) 6 - 20 mg/dL   Creatinine, Ser 4.20 (H) 0.61 - 1.24 mg/dL   Glucose, Bld >700 (HH) 70 - 99 mg/dL   Calcium, Ion 1.02 (L) 1.15 - 1.40 mmol/L   TCO2 8 (L) 22 - 32 mmol/L   Hemoglobin 13.3 13.0 - 17.0 g/dL   HCT 39.0 39.0 - 52.0 %   Comment NOTIFIED PHYSICIAN   Respiratory Panel by RT PCR (Flu A&B, Covid) - Nasopharyngeal Swab     Status: None   Collection Time: 09/27/19  2:34 AM   Specimen: Nasopharyngeal Swab  Result Value Ref Range   SARS Coronavirus 2 by RT PCR NEGATIVE NEGATIVE   Influenza A by PCR NEGATIVE NEGATIVE   Influenza B by PCR NEGATIVE NEGATIVE  CBG monitoring, ED     Status: Abnormal   Collection Time: 09/27/19  2:52 AM  Result Value Ref Range   Glucose-Capillary >600 (HH) 70 - 99 mg/dL  Basic metabolic panel     Status: Abnormal   Collection Time: 09/27/19  3:12 AM  Result Value Ref Range   Sodium 126 (L) 135 - 145 mmol/L   Potassium 7.2 (HH) 3.5 - 5.1 mmol/L   Chloride 90 (L) 98 - 111 mmol/L   CO2  5 (L) 22 - 32 mmol/L   Glucose, Bld 1,035 (HH) 70 - 99 mg/dL   BUN 62 (H) 6 - 20 mg/dL   Creatinine, Ser 4.51 (H) 0.61 - 1.24 mg/dL   Calcium 8.0 (L) 8.9 - 10.3 mg/dL   GFR calc non Af Amer 14 (L) >60 mL/min   GFR calc Af Amer 17 (L) >60 mL/min   Anion gap 31 (H) 5 - 15  Blood gas, venous     Status: Abnormal   Collection Time: 09/27/19  3:12 AM  Result Value Ref Range   pH, Ven 6.982 (LL) 7.250 - 7.430   pCO2, Ven 23.1 (L) 44.0 - 60.0 mmHg   pO2, Ven 56.3 (H) 32.0 - 45.0 mmHg   Bicarbonate 5.2 (L) 20.0 - 28.0 mmol/L   Acid-base deficit 25.7 (H) 0.0 - 2.0  mmol/L   O2 Saturation 79.3 %   Patient temperature 98.6   CBG monitoring, ED     Status: Abnormal   Collection Time: 09/27/19  3:26 AM  Result Value Ref Range   Glucose-Capillary >600 (HH) 70 - 99 mg/dL   Imaging Studies: No results found.  ED COURSE and MDM  Nursing notes, initial and subsequent vitals signs, including pulse oximetry, reviewed and interpreted by myself.  Vitals:   09/27/19 0150 09/27/19 0230 09/27/19 0300 09/27/19 0330  BP: 124/66 107/66 (!) 125/58 (!) 141/61  Pulse: (!) 138 (!) 125 (!) 124 (!) 125  Resp: (!) 26 20 (!) 25 (!) 26  Temp: (!) 96.9 F (36.1 C)     TempSrc: Axillary     SpO2: 96% 100% 100% 100%   Medications  insulin regular, human (MYXREDLIN) 100 units/ 100 mL infusion (8 Units/hr Intravenous New Bag/Given 09/27/19 0218)  0.9 %  sodium chloride infusion ( Intravenous New Bag/Given 09/27/19 0211)  dextrose 5 %-0.45 % sodium chloride infusion (has no administration in time range)  dextrose 50 % solution 0-50 mL (has no administration in time range)  sodium bicarbonate injection 50 mEq (has no administration in time range)  calcium gluconate in NaCl 1-0.675 GM/50ML-% IVPB (1,000 mg  Not Given 09/27/19 0224)  sodium bicarbonate 1 mEq/mL injection (has no administration in time range)  sodium chloride 0.9 % bolus 2,122 mL (0 mL/kg  106.1 kg Intravenous Stopped 09/27/19 0335)  ondansetron (ZOFRAN) injection 4 mg (4 mg Intravenous Given 09/27/19 0208)  calcium chloride 1 g in sodium chloride 0.9 % 100 mL IVPB (0 g Intravenous Stopped 09/27/19 0358)   2:02 AM EndoTool DKA protocol initiated.   2:14 AM Calcium gluconate and bicarbonate ordered for critically high potassium.  Patient has known chronic kidney disease.  4:13 AM Despite aggressive EndoTool treatment patient's sugar is only minimally improved and his pH and potassium are essentially unchanged.  PCCM consulted for admission.  5:31 AM Patient admitted to ICU by Dr. Jonnie Finner of  PCCM.  PROCEDURES  Procedures  CRITICAL CARE Performed by: Karen Chafe Eddie Payette Total critical care time: 45 minutes Critical care time was exclusive of separately billable procedures and treating other patients. Critical care was necessary to treat or prevent imminent or life-threatening deterioration. Critical care was time spent personally by me on the following activities: development of treatment plan with patient and/or surrogate as well as nursing, discussions with consultants, evaluation of patient's response to treatment, examination of patient, obtaining history from patient or surrogate, ordering and performing treatments and interventions, ordering and review of laboratory studies, ordering and review of radiographic studies, pulse oximetry and re-evaluation  of patient's condition.   ED DIAGNOSES     ICD-10-CM   1. Diabetic ketoacidosis without coma associated with type 1 diabetes mellitus ([place])  E10.10   2. Acute renal failure superimposed on stage 4 chronic kidney disease, unspecified acute renal failure type ([place])  N17.9    N18.4   3. Hyperkalemia  E87.5        Aydin Cavalieri, Jenny Reichmann, MD 09/27/19 [phone]

## 2019-09-27 NOTE — ED Notes (Signed)
Date and time results received: 09/27/19 4:01 AM (use smartphrase ".now" to insert current time)  Test: Potassium  Critical Value: 7.2   Name of Provider Notified: [place]   Orders Received? Or Actions [place]?:

## 2019-09-27 NOTE — Progress Notes (Signed)
  Echocardiogram 2D Echocardiogram has been performed.  [name] 09/27/2019, 11:21 AM

## 2019-09-27 NOTE — ED Notes (Addendum)
Pt. I-stat Chem 8 results potassium 7.1, sodium 120 and glucose greater than 700. Molpus,MD. Made aware.

## 2019-09-27 NOTE — ED Notes (Signed)
Date and time results received: 09/27/19 2:41 AM   Test: glucose Critical Value: 1104  Name of Provider Notified: [place],MD

## 2019-09-27 NOTE — Progress Notes (Signed)
Inpatient Diabetes Program Recommendations  AACE/ADA: New Consensus Statement on Inpatient Glycemic Control (2015)  Target Ranges:  Prepandial:   less than 140 mg/dL      Peak postprandial:   less than 180 mg/dL (1-2 hours)      Critically ill patients:  140 - 180 mg/dL   Results for KEEAN, WILMETH (MRN [id number]) as of 09/27/2019 10:59  Ref. Range 09/27/2019 01:54 09/27/2019 02:52 09/27/2019 03:26 09/27/2019 04:11 09/27/2019 05:02 09/27/2019 05:50 09/27/2019 07:20 09/27/2019 08:53 09/27/2019 10:03  Glucose-Capillary Latest Ref Range: 70 - 99 mg/dL >600 (HH)  IV Insulin Drip initiated per ENDOtool >600 (HH) >600 (HH) >600 (HH) >600 (HH) >600 (HH) >600 (HH) >600 (HH) 583 (HH)   Results for KOBEN, DAMAN (MRN [id number]) as of 09/27/2019 10:59  Ref. Range 09/27/2019 01:56 09/27/2019 08:42  Beta-Hydroxybutyric Acid Latest Ref Range: 0.05 - 0.27 mmol/L >8.00 (H) >8.00 (H)     Admit with: Severe DKA  History: Type 1 diabetes, CKD4  Home DM Meds: Insulin Pump (hasn't used in months since losing weight and reported that CBGs were normal off the pump)  Current Orders: IV Insulin Drip   Endocrinologist: Dr. [name] with [facility]--last seen 04/16/2019--Insulin Pump settings were adjusted to the following: Basal rates 12 am 1.00 units/hr  5:30 am 1.15 units/hr Total Basal= 26.775 units per 24 hour period Bolus settings I:C ratio 12 am 1:9.5 Sensitivity 40 Target 120-120    8:49am BMET shows the following: Glucose 736 mg/dl Anion Gap 22 CO2 level 12    MD- Note 8:49am BMET shows patient not ready to transition to SQ Insulin yet.  Unsure why patient would stop his insulin pump without getting advice/assistance from his Endocrinologist??  When you deem patient is safe and ready to transition to SQ Insulin (please wait until CO2 is at least 20 or higher and CBGs stable), recommend the following: 1. Lantus 20 units daily (0.2 units/kg) [was getting about 27 units  basal insulin whenhe was using his pump] 2. Novolog Moderate Correction Scale/ SSI (0-15 units) Q4 hours     --Will follow patient during hospitalization--  Wyn Quaker RN, MSN, CDE Diabetes Coordinator Inpatient Glycemic Control Team Team Pager: [phone] (8a-5p)

## 2019-09-27 NOTE — ED Notes (Addendum)
Date and time results received: 09/27/19 0321   Test: ph Critical Value: 6.982  Name of Provider Notified: [place], MD

## 2019-09-27 NOTE — Progress Notes (Signed)
ANTICOAGULATION CONSULT NOTE - Follow Up Consult  Pharmacy Consult for heparin Indication: chest pain/ACS  Allergies  Allergen Reactions  . Oxycontin [Oxycodone Hcl] Nausea And Vomiting  . Penicillins Other (See Comments)    Possible reaction many years ago per patient  . Prednisone Other (See Comments)    Patient is diabetic, runs sugar up  . Shellfish-Derived Products     Pt is not allergic to iodine, has had iodine in the past w/o premeds and w/ no problems    Patient Measurements: Height: 5\' 9"  (175.3 cm) Weight: 240 lb 4.8 oz (109 kg) IBW/kg (Calculated) : 70.7 Heparin Dosing Weight: 95 kg  Vital Signs: Temp: 98 F (36.7 C) (12/24 1600) Temp Source: Oral (12/24 1600) BP: 109/60 (12/24 1826) Pulse Rate: 91 (12/24 1826)  Labs: Recent Labs    09/27/19 0200 09/27/19 0209 09/27/19 0842 09/27/19 0849 09/27/19 1235 09/27/19 1619  HGB 11.8* 13.3  --   --   --   --   HCT 41.9 39.0  --   --   --   --   PLT 305  --   --   --   --   --   CREATININE 4.57* 4.20*  --  4.42* 4.33* 4.30*  TROPONINIHS  --   --  2,654*  --  7,715* 9,332*    Estimated Creatinine Clearance: 25.8 mL/min (A) (by C-G formula based on SCr of 4.3 mg/dL (H)).  Assessment: Patient is a 48 y.o M with hx DM1 presented to the ED on 12/24 with hyperglycemia and was found to be in DKA.  Heparin drip was started on admission for elevated troponin and later switched to heparin SQ for VTE prophylaxis.  Pharmacy was re-consulted to switch patient back to heparin drip d/t increasing troponin and suspected ACS.  - patient received heparin 5000 units SQ at 2PM.  Goal of Therapy:  Heparin level 0.3-0.7 units/ml Monitor platelets by anticoagulation protocol: Yes   Plan:  - d/c heparin SQ - heparin 2500 units IV x1, then drip at 1200 units/hr - check 6 hr heparin level - monitor for s/s bleeding  Eileen Kangas P 09/27/2019,7:26 PM

## 2019-09-27 NOTE — Progress Notes (Signed)
NAME:  [name], MRN:  [id number], DOB:  Apr 27, 1971, LOS: 0 ADMISSION DATE:  09/27/2019, CONSULTATION DATE:  09/27/19 REFERRING MD:  Shanon Rosser, MD, CHIEF COMPLAINT:  DKA   Brief History   [name] is a 48 y.o. male with h/o DM1 and CKD4 presenting with DKA after removing his insulin pump for the last few months. Blood sugar >1100 on arrival. Has history of DKA in the past. No infectious symptoms present on arrival.  History of present illness   [name] is a 48 y.o. 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 Brought in by EMS drowsy but alert with apparent [place] breathing. He complained of severe thirst and increased urination. Has not taken any insulin today. The day prior to presentation, he reports that his blood sugars were high (280s). He stopped using his insulin pump some months ago because he had a 35 lb intentional weight loss and reported that his blood sugars have been much improved recently. He does endorse prior history of diabetic ketoacidosis. He denies fever, chills, runny nose, sore throat, cough, chest pain, chest tightness, palpitations, vomiting, diarrhea or dysuria. He endorses polydipsia, polyuria and nausea. Denies alcohol use.  In the ED, he is afebrile, in sinus tachycardia and saturating comfortably on 2L/min via nasal cannula. Labs notable for initial blood glucose of 1104. Corrected sodium 144.7, K 7.2, CO2 5,  BUN 63, Creatinine 4.57 (from 3.02 most recently in October 2020). Beta-hydroxy >8.0. WBC 15.1, Hgb 11.8, plt 305. Initial EKG shows sinus tachycardia with peaked T waves. Initial VBG showed pH 7.036, pCO2 23. He was given 2.1 L NS bolus and started on insulin infusion while in the ED. He was also given sodium bicarb x2 and calcium chloride x1.  Past Medical History  DM1 with retinopathy and nephropathy CKD  stage IV HTN HLD  Significant Hospital Events   N/A  Consults:  PCCM  Procedures:  N/A  Significant Diagnostic Tests:  EKG 12/24: sinus tachycardia, peaked T waves, no ischemic changes CXR 12/24: persistent left hemidiaphragmatic elevation, no acute findings  Micro Data:  Blood cultures not yet collected UA  Not yet collected   Antimicrobials:  None   Interval events:  Has not urinated yet. Very thirsty and complains that his mouth is dry and his throat is sore from vomiting for the past 2 days. Came to the hospital after falling down at home.  Objective   Blood pressure 140/64, pulse (!) 114, temperature 97.6 F (36.4 C), temperature source Oral, resp. rate (!) 22, height 5\' 9"  (1.753 m), weight 109 kg, SpO2 99 %.        Intake/Output Summary (Last 24 hours) at 09/27/2019 0827 Last data filed at 09/27/2019 0700 Gross per 24 hour  Intake 433.23 ml  Output --  Net 433.23 ml   Filed Weights   09/27/19 0600  Weight: 109 kg    Examination: General: middle-aged man laying in bed in NAD HENT: [place]/AT, keeping his eyes closed throughout the encounter Lungs: tachypneic, CTAB, breathing comfortably on [place] Cardiovascular: tachycardic, regular rhythm, no murmurs Abdomen: soft, ND, NT Extremities: no peripheral edema or obvious disfiguring injuries. Neuro: awake and alert, speaking in full sentences, answering questions appropriately, moving spontaneously Derm: no rashes or bruising   Assessment & Plan:  Severe diabetic ketoacidosis- Reports being uncontrolled for several weeks.  Precipitated by noncompliance.  A1c pending. -Additional  1L LR bolus now, switching maintenance fluids to LR -IV insulin and Accu-Checks per protocol, although it has yet to titrate up his insulin dose -BMP every 4 hours -Waiting for blood cultures to be drawn, UA to be collected -N.p.o. except for water and ice chips -[place] heparin  AKI on CKD Hyperkalemia with peaked T waves -Additional  fluid resuscitation and IV insulin to address hyperkalemia -Serial BMPs every 4 hours -Monitor on telemetry -May require additional fluid resuscitation if still not urinating -Renally dose meds and avoid nephrotoxic meds. -Holding PTA losartan  Hypertension -Hold home losartan -Okay to start PTA Coreg once volume resuscitated  Hyperlipidemia -Continue PTA atorvastatin  Chronic macrocytic anemia -Continue to monitor -Needs an outpatient work-up -Multivitamin  Best practice:  Diet: sips & chips Pain/Anxiety/Delirium protocol (if indicated): N/A VAP protocol (if indicated): N/A DVT prophylaxis: SQH GI prophylaxis: N/A Glucose control: IV insulin Mobility: Bed rest Code Status: Full Code Family Communication: None Disposition: ICU  Labs   CBC: Recent Labs  Lab 09/27/19 0200 09/27/19 0209  WBC 15.1*  --   NEUTROABS 11.4*  --   HGB 11.8* 13.3  HCT 41.9 39.0  MCV 102.2*  --   PLT 305  --     Basic Metabolic Panel: Recent Labs  Lab 09/27/19 0200 09/27/19 0209 09/27/19 0312  NA 123* 120* 126*  K 7.2* 7.1* 7.2*  CL 86* 94* 90*  CO2 <7*  --  5*  GLUCOSE 1,104* >700* 1,035*  BUN 63* 57* 62*  CREATININE 4.57* 4.20* 4.51*  CALCIUM 8.1*  --  8.0*   GFR: Estimated Creatinine Clearance: 24.6 mL/min (A) (by C-G formula based on SCr of 4.51 mg/dL (H)). Recent Labs  Lab 09/27/19 0200  WBC 15.1*    Liver Function Tests: No results for input(s): AST, ALT, ALKPHOS, BILITOT, PROT, ALBUMIN in the last 168 hours. No results for input(s): LIPASE, AMYLASE in the last 168 hours. No results for input(s): AMMONIA in the last 168 hours.  ABG    Component Value Date/Time   HCO3 5.2 (L) 09/27/2019 0312   TCO2 8 (L) 09/27/2019 0209   ACIDBASEDEF 25.7 (H) 09/27/2019 0312   O2SAT 79.3 09/27/2019 0312     Coagulation Profile: No results for input(s): INR, PROTIME in the last 168 hours.  Cardiac Enzymes: No results for input(s): CKTOTAL, CKMB, CKMBINDEX, TROPONINI in  the last 168 hours.  HbA1C: Hgb A1c MFr Bld  Date/Time Value Ref Range Status  08/29/2018 04:31 AM 10.9 (H) 4.8 - 5.6 % Final    Comment:    (NOTE) Pre diabetes:          5.7%-6.4% Diabetes:              >6.4% Glycemic control for   <7.0% adults with diabetes   08/28/2018 07:05 PM 10.9 (H) 4.8 - 5.6 % Final    Comment:    (NOTE) Pre diabetes:          5.7%-6.4% Diabetes:              >6.4% Glycemic control for   <7.0% adults with diabetes     CBG: Recent Labs  Lab 09/27/19 0326 09/27/19 0411 09/27/19 0502 09/27/19 0550 09/27/19 0720  GLUCAP >600* >600* >600* >600* >600*        This patient is critically ill with multiple organ system failure which requires frequent high complexity decision making, assessment, support, evaluation, and titration of therapies. This was completed through the application of advanced monitoring technologies and  extensive interpretation of multiple databases. During this encounter critical care time was devoted to patient care services described in this note for 35 minutes.  Julian Hy, DO 09/27/19 8:45 AM [facility]

## 2019-09-27 NOTE — Plan of Care (Addendum)
Trop 2654 EKG sinus tach, improving peaked T waves. No ST changes or TWI.  Plan: Restart coreg now at lower dose ASA 325mg , daily ASA (which he takes at home) Heparin  Stat echo con't DKA management  Julian Hy, DO 09/27/19 10:06 AM [facility]   Echo completed-normal EF and no wall motion abnormalities.  Heparin infusion discontinued.  Julian Hy, DO 09/27/19 1:45 PM [facility]    Nausea this afternoon. Treated with phenergan.  Trop uptrending, most recently 9332 Repeat EKG with TWI in V5-6. Discussed with Cardiology on call. Consult placed, they will see him over the weekend. If he deteriorates clinically, they will see him sooner.  Heparin for 48 hours.  Julian Hy, DO 09/27/19 7:08 PM [facility]

## 2019-09-28 ENCOUNTER — Inpatient Hospital Stay (HOSPITAL_COMMUNITY): Payer: 59

## 2019-09-28 DIAGNOSIS — I248 Other forms of acute ischemic heart disease: Secondary | ICD-10-CM

## 2019-09-28 DIAGNOSIS — R778 Other specified abnormalities of plasma proteins: Secondary | ICD-10-CM

## 2019-09-28 DIAGNOSIS — D649 Anemia, unspecified: Secondary | ICD-10-CM

## 2019-09-28 LAB — HEPARIN LEVEL (UNFRACTIONATED)
Heparin Unfractionated: 0.27 IU/mL — ABNORMAL LOW (ref 0.30–0.70)
Heparin Unfractionated: 0.37 IU/mL (ref 0.30–0.70)
Heparin Unfractionated: 0.64 IU/mL (ref 0.30–0.70)

## 2019-09-28 LAB — CBC
HCT: 38.4 % — ABNORMAL LOW (ref 39.0–52.0)
Hemoglobin: 12.3 g/dL — ABNORMAL LOW (ref 13.0–17.0)
MCH: 28.5 pg (ref 26.0–34.0)
MCHC: 32 g/dL (ref 30.0–36.0)
MCV: 89.1 fL (ref 80.0–100.0)
Platelets: 258 10*3/uL (ref 150–400)
RBC: 4.31 MIL/uL (ref 4.22–5.81)
RDW: 13.8 % (ref 11.5–15.5)
WBC: 15.2 10*3/uL — ABNORMAL HIGH (ref 4.0–10.5)
nRBC: 0 % (ref 0.0–0.2)

## 2019-09-28 LAB — URINALYSIS, ROUTINE W REFLEX MICROSCOPIC
Bilirubin Urine: NEGATIVE
Glucose, UA: 150 mg/dL — AB
Ketones, ur: 5 mg/dL — AB
Leukocytes,Ua: NEGATIVE
Nitrite: NEGATIVE
Protein, ur: >=300 mg/dL — AB
Specific Gravity, Urine: 1.013 (ref 1.005–1.030)
pH: 5 (ref 5.0–8.0)

## 2019-09-28 LAB — HEMOGLOBIN A1C
Hgb A1c MFr Bld: 10.2 % — ABNORMAL HIGH (ref 4.8–5.6)
Mean Plasma Glucose: 246 mg/dL

## 2019-09-28 LAB — GLUCOSE, CAPILLARY
Glucose-Capillary: 139 mg/dL — ABNORMAL HIGH (ref 70–99)
Glucose-Capillary: 150 mg/dL — ABNORMAL HIGH (ref 70–99)
Glucose-Capillary: 180 mg/dL — ABNORMAL HIGH (ref 70–99)
Glucose-Capillary: 201 mg/dL — ABNORMAL HIGH (ref 70–99)
Glucose-Capillary: 192 mg/dL — ABNORMAL HIGH (ref 70–99)
Glucose-Capillary: 141 mg/dL — ABNORMAL HIGH (ref 70–99)
Glucose-Capillary: 114 mg/dL — ABNORMAL HIGH (ref 70–99)
Glucose-Capillary: 93 mg/dL (ref 70–99)
Glucose-Capillary: 109 mg/dL — ABNORMAL HIGH (ref 70–99)

## 2019-09-28 LAB — BASIC METABOLIC PANEL
Anion gap: 8 (ref 5–15)
BUN: 55 mg/dL — ABNORMAL HIGH (ref 6–20)
CO2: 25 mmol/L (ref 22–32)
Calcium: 8.4 mg/dL — ABNORMAL LOW (ref 8.9–10.3)
Chloride: 106 mmol/L (ref 98–111)
Creatinine, Ser: 4.02 mg/dL — ABNORMAL HIGH (ref 0.61–1.24)
GFR calc Af Amer: 19 mL/min — ABNORMAL LOW (ref >60)
GFR calc non Af Amer: 17 mL/min — ABNORMAL LOW (ref >60)
Glucose, Bld: 149 mg/dL — ABNORMAL HIGH (ref 70–99)
Potassium: 4.6 mmol/L (ref 3.5–5.1)
Sodium: 139 mmol/L (ref 135–145)

## 2019-09-28 LAB — SODIUM, URINE, RANDOM: Sodium, Ur: 28 mmol/L

## 2019-09-28 LAB — TROPONIN I (HIGH SENSITIVITY): Troponin I (High Sensitivity): 3150 ng/L (ref <18)

## 2019-09-28 LAB — CREATININE, URINE, RANDOM: Creatinine, Urine: 121.79 mg/dL

## 2019-09-28 MED ORDER — INSULIN ASPART 100 UNIT/ML ~~LOC~~ SOLN
2.0000 [IU] | SUBCUTANEOUS | Status: DC
  Administered 2019-09-28: 6 [IU] via SUBCUTANEOUS
  Administered 2019-09-28: 2 [IU] via SUBCUTANEOUS
  Administered 2019-09-28: 4 [IU] via SUBCUTANEOUS
  Administered 2019-09-29 (×2): 2 [IU] via SUBCUTANEOUS

## 2019-09-28 MED ORDER — SODIUM CHLORIDE 0.9 % IV SOLN
8.0000 mg | Freq: Four times a day (QID) | INTRAVENOUS | Status: DC | PRN
  Filled 2019-09-28: qty 4

## 2019-09-28 MED ORDER — INSULIN DETEMIR 100 UNIT/ML ~~LOC~~ SOLN
8.0000 [IU] | Freq: Once | SUBCUTANEOUS | Status: AC
  Administered 2019-09-28: 8 [IU] via SUBCUTANEOUS
  Filled 2019-09-28: qty 0.08

## 2019-09-28 MED ORDER — NICARDIPINE HCL IN NACL 20-0.86 MG/200ML-% IV SOLN
0.0000 mg/h | INTRAVENOUS | Status: DC
  Administered 2019-09-28: 12.5 mg/h via INTRAVENOUS
  Administered 2019-09-28: 10 mg/h via INTRAVENOUS
  Administered 2019-09-28: 5 mg/h via INTRAVENOUS
  Administered 2019-09-29 (×2): 10 mg/h via INTRAVENOUS
  Administered 2019-09-29 (×3): 12.5 mg/h via INTRAVENOUS
  Administered 2019-09-29 (×3): 10 mg/h via INTRAVENOUS
  Administered 2019-09-29 (×3): 12.5 mg/h via INTRAVENOUS
  Administered 2019-09-30: 15 mg/h via INTRAVENOUS
  Administered 2019-09-30 (×2): 10 mg/h via INTRAVENOUS
  Filled 2019-09-28 (×25): qty 200

## 2019-09-28 MED ORDER — HEPARIN (PORCINE) 25000 UT/250ML-% IV SOLN
1300.0000 [IU]/h | INTRAVENOUS | Status: DC
  Administered 2019-09-28 (×2): 1400 [IU]/h via INTRAVENOUS
  Administered 2019-09-29: 1300 [IU]/h via INTRAVENOUS
  Filled 2019-09-28 (×3): qty 250

## 2019-09-28 MED ORDER — ONDANSETRON HCL 4 MG/2ML IJ SOLN: 4.0000 mg | Freq: Four times a day (QID) | INTRAMUSCULAR | Status: DC | PRN

## 2019-09-28 MED ORDER — ASPIRIN EC 81 MG PO TBEC
81.0000 mg | DELAYED_RELEASE_TABLET | Freq: Every day | ORAL | Status: DC
  Administered 2019-09-28 – 2019-10-03 (×6): 81 mg via ORAL
  Filled 2019-09-28 (×6): qty 1

## 2019-09-28 MED ORDER — SODIUM CHLORIDE 0.9 % IV SOLN
8.0000 mg | Freq: Four times a day (QID) | INTRAVENOUS | Status: DC | PRN
  Administered 2019-09-28 – 2019-09-30 (×6): 8 mg via INTRAVENOUS
  Filled 2019-09-28 (×9): qty 4

## 2019-09-28 MED ORDER — INSULIN DETEMIR 100 UNIT/ML ~~LOC~~ SOLN
15.0000 [IU] | Freq: Two times a day (BID) | SUBCUTANEOUS | Status: DC
  Administered 2019-09-28 – 2019-09-29 (×4): 15 [IU] via SUBCUTANEOUS
  Filled 2019-09-28 (×5): qty 0.15

## 2019-09-28 NOTE — Progress Notes (Signed)
ANTICOAGULATION CONSULT NOTE - Consult  Pharmacy Consult for heparin Indication: chest pain/ACS  Allergies  Allergen Reactions  . Oxycontin [Oxycodone Hcl] Nausea And Vomiting  . Penicillins Other (See Comments)    Possible reaction many years ago per patient  . Prednisone Other (See Comments)    Patient is diabetic, runs sugar up  . Shellfish-Derived Products     Pt is not allergic to iodine, has had iodine in the past w/o premeds and w/ no problems    Patient Measurements: Height: 5\' 9"  (175.3 cm) Weight: 240 lb 4.8 oz (109 kg) IBW/kg (Calculated) : 70.7 Heparin Dosing Weight: 95 kg  Vital Signs: Temp: 99 F (37.2 C) (12/25 0349) Temp Source: Oral (12/25 0349) BP: 111/44 (12/25 0400) Pulse Rate: 104 (12/25 0400)  Labs: Recent Labs    09/27/19 0200 09/27/19 0209 09/27/19 0312 09/27/19 1235 09/27/19 1619 09/27/19 1951 09/28/19 0241  HGB 11.8* 13.3  --   --   --   --  12.3*  HCT 41.9 39.0  --   --   --   --  38.4*  PLT 305  --   --   --   --   --  258  HEPARINUNFRC  --   --   --   --   --   --  0.27*  CREATININE 4.57* 4.20*  --  4.33* 4.30* 4.20*  --   TROPONINIHS  --   --    < > 7,715* 9,332* 10,640*  --    < > = values in this interval not displayed.    Estimated Creatinine Clearance: 26.4 mL/min (A) (by C-G formula based on SCr of 4.2 mg/dL (H)).  Assessment: Patient is a 48 y.o M with hx DM1 presented to the ED on 12/24 with hyperglycemia and was found to be in DKA.  Heparin drip was started on admission for elevated troponin and later switched to heparin SQ for VTE prophylaxis.  Pharmacy was re-consulted to switch patient back to heparin drip d/t increasing troponin and suspected ACS.  - patient received heparin 5000 units SQ at [facility]   Today, 12/25 0444=0.27 below goal, no bleeding or infusion issues per RN.   Goal of Therapy:  Heparin level 0.3-0.7 units/ml Monitor platelets by anticoagulation protocol: Yes   Plan:   - increase heparin drip to 1400  units/hr - recheck HL in 6 hours - monitor for s/s bleeding  Dorrene German 09/28/2019,5:03 AM

## 2019-09-28 NOTE — Progress Notes (Signed)
CRITICAL VALUE ALERT  Critical Value:  High sensitivity troponin 3,150  Date & Time Notied:  09/28/2019 1600  Provider Notified: Dr. Carlis Abbott  Orders Received/Actions taken: Trending down, continue to monitor

## 2019-09-28 NOTE — Progress Notes (Signed)
[facility] Progress Note Patient Name: [name] DOB: [birth year] MRN: [id number]   Date of Service  09/28/2019  HPI/Events of Note  Uncontrolled hypertension with associated headache, BP 216-228/104-115, did not improve with iv Labetalol.  eICU Interventions  Cardene infusion ordered to initially control BP, allowing transition to maintenance agent.        Kerry Kass Thressa Shiffer 09/28/2019, 7:48 PM

## 2019-09-28 NOTE — Consult Note (Signed)
Cardiology Consultation:   Patient ID: [name] MRN: [id number]; DOB: 1970/10/31  Admit date: 09/27/2019 Date of Consult: 09/28/2019  Primary Care Provider: [name], MD Primary Cardiologist: No primary care provider on file.  Primary Electrophysiologist:  None   Patient Profile:   [name] is a 48 y.o. male with a hx of T1DM, CKD4 who is being seen today for the evaluation of troponin elevation at the request of Dr Carlis Abbott.  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  He had weaned himself off insulin pump a few months ago.  In the ED, his labs were notable for potassium 7.2, creatinine 4.6 (was 3.0 in October 2020), pH 7.0 on initial VBG.  He received 2 L normal saline in the ED, calcium chloride, and sodium bicarb.  Initial EKG was notable for peaked T waves.  He was admitted for DKA and started on IV insulin.  Hyperkalemia has resolved.  Found to have significant troponin elevation, with high-sensitivity troponin up to 10,640.  TTE showed hyperdynamic systolic function (EF 70 to 75%), normal RV function, no significant valvular disease, IVC dilated and noncollapsible, suggesting elevated right atrial pressure.  He denies any chest pain.  Also reports that he walks about 4-5 miles per day at work, denies any exertional chest pain.   Heart Pathway Score:     Past Medical History:  Diagnosis Date  . CKD (chronic kidney disease), stage II   . Gastroparesis   . Herniated cervical disc   . HTN (hypertension)   . Hypercholesteremia   . S/P cardiac cath    a. 10-15 yrs ago at [facility] due to tachycardia, reportedly normal. b. Normal ETT 03/2012.  Marland Kitchen Sinus tachycardia    a. 24-hr Holter 03/2012 - SR, occ PVCs, no VT, avg HR 96bpm.  . TIA (transient ischemic attack)   .  Type 1 diabetes mellitus ([place])    a. With insulin pump.    Past Surgical History:  Procedure Laterality Date  . cervical neck fusion    . HERNIA REPAIR     bilateral  . LEFT HEART CATHETERIZATION WITH CORONARY ANGIOGRAM N/A 12/18/2013   Procedure: LEFT HEART CATHETERIZATION WITH CORONARY ANGIOGRAM;  Surgeon: [name] M Martinique, MD;  Location: [facility] CATH LAB;  Service: Cardiovascular;  Laterality: N/A;      Inpatient Medications: Scheduled Meds: . atorvastatin  80 mg Oral q1800  . carvedilol  3.125 mg Oral BID WC  . Chlorhexidine Gluconate Cloth  6 each Topical Daily  . insulin aspart  2-6 Units Subcutaneous Q4H  . insulin detemir  15 Units Subcutaneous Q12H  . mouth rinse  15 mL Mouth Rinse BID  . multivitamin with minerals  1 tablet Oral Daily   Continuous Infusions: . heparin 1,400 Units/hr (09/28/19 0600)  . insulin Stopped (09/28/19 0441)  . lactated ringers 150 mL/hr at 09/28/19 0600   PRN Meds: acetaminophen, dextrose, ondansetron (ZOFRAN) IV, phenol, promethazine  Allergies:    Allergies  Allergen Reactions  . Oxycontin [Oxycodone Hcl] Nausea And Vomiting  . Penicillins Other (See Comments)    Possible reaction many years ago per patient  . Prednisone Other (See Comments)    Patient is diabetic, runs sugar up  . Shellfish-Derived Products     Pt is not allergic to iodine, has had iodine in  the past w/o premeds and w/ no problems    Social History:   Social History   Socioeconomic History  . Marital status: Married    Spouse name: Not on file  . Number of children: Not on file  . Years of education: Not on file  . Highest education level: Not on file  Occupational History  . Not on file  Tobacco Use  . Smoking status: Never Smoker  . Smokeless tobacco: Never Used  Substance and Sexual Activity  . Alcohol use: No  . Drug use: No  . Sexual activity: Yes  Other Topics Concern  . Not on file  Social History Narrative  . Not on file   Social Determinants of  Health   Financial Resource Strain:   . Difficulty of Paying Living Expenses: Not on file  Food Insecurity:   . Worried About Charity fundraiser in the Last Year: Not on file  . Ran Out of Food in the Last Year: Not on file  Transportation Needs:   . Lack of Transportation (Medical): Not on file  . Lack of Transportation (Non-Medical): Not on file  Physical Activity:   . Days of Exercise per Week: Not on file  . Minutes of Exercise per Session: Not on file  Stress:   . Feeling of Stress : Not on file  Social Connections:   . Frequency of Communication with Friends and Family: Not on file  . Frequency of Social Gatherings with Friends and Family: Not on file  . Attends Religious Services: Not on file  . Active Member of Clubs or Organizations: Not on file  . Attends Archivist Meetings: Not on file  . Marital Status: Not on file  Intimate Partner Violence:   . Fear of Current or Ex-Partner: Not on file  . Emotionally Abused: Not on file  . Physically Abused: Not on file  . Sexually Abused: Not on file    Family History:    Family History  Problem Relation Age of Onset  . Hypertension Other   . Coronary artery disease Other        Mother's side - both her side's grandparents died of heart disease (MIs)  . Diabetes Other   . Stroke Other        Paternal grandfather (57)  . Prostate cancer Neg Hx   . Bladder Cancer Neg Hx   . Kidney cancer Neg Hx      ROS:  Please see the history of present illness.   All other ROS reviewed and negative.     Physical Exam/Data:   Vitals:   09/28/19 0200 09/28/19 0349 09/28/19 0400 09/28/19 0600  BP: 117/70  (!) 111/44 (!) 97/45  Pulse: (!) 101  (!) 104 100  Resp: 14  14 15   Temp:  99 F (37.2 C)    TempSrc:  Oral    SpO2: 96%  92% 96%  Weight:      Height:        Intake/Output Summary (Last 24 hours) at 09/28/2019 0726 Last data filed at 09/28/2019 0600 Gross per 24 hour  Intake 2227.18 ml  Output 1550 ml   Net 677.18 ml   Last 3 Weights 09/27/2019 09/21/2019 07/13/2019  Weight (lbs) 240 lb 4.8 oz 234 lb 255 lb 1.2 oz  Weight (kg) 109 kg 106.142 kg 115.7 kg     Body mass index is 35.49 kg/m.  General:  in no acute distress HEENT: normal Lymph: no adenopathy  Neck: no JVD Endocrine:  No thryomegaly Vascular: No carotid bruits Cardiac:  normal S1, S2; regular, tachycardic; no murmur  Lungs:  clear to auscultation bilaterally, no wheezing, rhonchi or rales  Abd: soft, nontender, no hepatomegaly  Ext: no edema Musculoskeletal:  No deformities Skin: warm and dry  Neuro:  CNs 2-12 intact, no focal abnormalities noted Psych:  Normal affect   EKG:  The EKG was personally reviewed and demonstrates:  12/24 on admission: Nonspecific intraventricular conduction delay, sinus tachycardia, peaked T waves.  Repeat EKG 12/24 7pm shows resolution of peaked T waves, narrow QRS, sinus rhythm with rate 88, nonspecific T wave flattening Telemetry:  Telemetry was personally reviewed and demonstrates:  Sinus rhythm, rate 100s  Relevant CV Studies: TTE 09/27/19:  1. Left ventricular ejection fraction, by visual estimation, is 70 to 75%. The left ventricle has hyperdynamic function. There is no left ventricular hypertrophy.  2. The left ventricle has no regional wall motion abnormalities.  3. Global right ventricle has normal systolic function.The right ventricular size is normal. No increase in right ventricular wall thickness.  4. Left atrial size was normal.  5. Right atrial size was normal.  6. The mitral valve is normal in structure. No evidence of mitral valve regurgitation. No evidence of mitral stenosis.  7. The tricuspid valve is normal in structure.  8. The aortic valve is normal in structure. Aortic valve regurgitation is not visualized. No evidence of aortic valve sclerosis or stenosis.  9. The pulmonic valve was normal in structure. Pulmonic valve regurgitation is not visualized. 10. The  inferior vena cava is dilated in size with <50% respiratory variability, suggesting right atrial pressure of 15 mmHg. 11. Prior images reviewed side by side. 12. Inferior vena cava is dilated, but otherwise no change from 04/10/2018.   Exercise Myoview 04/11/2018:  Normal exercise myocardial perfusion stress test without significant ischemia or scar.  The left ventricular ejection fraction is normal by visual estimation and Siemens calculation (LVEF>65). LVEF by QGS is 46%.  Hypertensive blood pressure response to exercise.  This is a low risk study.  Laboratory Data:  High Sensitivity Troponin:   Recent Labs  Lab 09/27/19 0842 09/27/19 1235 09/27/19 1619 09/27/19 1951  TROPONINIHS 2,654* 7,715* 9,332* 10,640*     Chemistry Recent Labs  Lab 09/27/19 1235 09/27/19 1619 09/27/19 1951  NA 134* 137 139  K 4.0 3.6 4.3  CL 101 104 106  CO2 20* 23 22  GLUCOSE 532* 290* 100*  BUN 61* 60* 57*  CREATININE 4.33* 4.30* 4.20*  CALCIUM 8.1* 8.3* 8.2*  GFRNONAA 15* 15* 16*  GFRAA 18* 18* 18*  ANIONGAP 13 10 11     No results for input(s): PROT, ALBUMIN, AST, ALT, ALKPHOS, BILITOT in the last 168 hours. Hematology Recent Labs  Lab 09/27/19 0200 09/27/19 0209 09/28/19 0241  WBC 15.1*  --  15.2*  RBC 4.10*  --  4.31  HGB 11.8* 13.3 12.3*  HCT 41.9 39.0 38.4*  MCV 102.2*  --  89.1  MCH 28.8  --  28.5  MCHC 28.2*  --  32.0  RDW 13.5  --  13.8  PLT 305  --  258   BNPNo results for input(s): BNP, PROBNP in the last 168 hours.  DDimer No results for input(s): DDIMER in the last 168 hours.   Radiology/Studies:  DG CHEST PORT 1 VIEW  Result Date: 09/27/2019 CLINICAL DATA:  Diabetic ketoacidosis.  Weakness. EXAM: PORTABLE CHEST 1 VIEW COMPARISON:  02/03/2019 FINDINGS: The cardiac silhouette,  mediastinal and hilar contours are within normal limits and stable. Stable eventration of both hemidiaphragms, left greater than right with some overlying vascular crowding and streaky  atelectasis. No infiltrates or effusions. No worrisome pulmonary lesions. The bony thorax is intact. IMPRESSION: No acute cardiopulmonary findings. Electronically Signed   By: Marijo Sanes M.D.   On: 09/27/2019 05:19   ECHOCARDIOGRAM COMPLETE  Result Date: 09/27/2019   ECHOCARDIOGRAM REPORT   Patient Name:   [name] Date of Exam: 09/27/2019 Medical Rec #:  [id number]           Height:       69.0 in Accession #:    [id number]          Weight:       240.3 lb Date of Birth:  1971-01-15           BSA:          2.23 m Patient Age:    58 years            BP:           132/50 mmHg Patient Gender: M                   HR:           102 bpm. Exam Location:  Inpatient Procedure: 2D Echo STAT ECHO Indications:    elevated troponin  History:        Patient has prior history of Echocardiogram examinations, most                 recent 08/29/2018. TIA; Risk Factors:Hypertension, Diabetes and                 Dyslipidemia. Sinus tachycardia.  Sonographer:    Jannett Celestine RDCS (AE) Referring Phys: [id number] [place]  1. Left ventricular ejection fraction, by visual estimation, is 70 to 75%. The left ventricle has hyperdynamic function. There is no left ventricular hypertrophy.  2. The left ventricle has no regional wall motion abnormalities.  3. Global right ventricle has normal systolic function.The right ventricular size is normal. No increase in right ventricular wall thickness.  4. Left atrial size was normal.  5. Right atrial size was normal.  6. The mitral valve is normal in structure. No evidence of mitral valve regurgitation. No evidence of mitral stenosis.  7. The tricuspid valve is normal in structure.  8. The aortic valve is normal in structure. Aortic valve regurgitation is not visualized. No evidence of aortic valve sclerosis or stenosis.  9. The pulmonic valve was normal in structure. Pulmonic valve regurgitation is not visualized. 10. The inferior vena cava is dilated in size with <50%  respiratory variability, suggesting right atrial pressure of 15 mmHg. 11. Prior images reviewed side by side. 12. Inferior vena cava is dilated, but otherwise no change from 04/10/2018. FINDINGS  Left Ventricle: Left ventricular ejection fraction, by visual estimation, is 70 to 75%. The left ventricle has hyperdynamic function. The left ventricle has no regional wall motion abnormalities. There is no left ventricular hypertrophy. Left ventricular diastolic parameters were normal. Normal left atrial pressure. Right Ventricle: The right ventricular size is normal. No increase in right ventricular wall thickness. Global RV systolic function is has normal systolic function. Left Atrium: Left atrial size was normal in size. Right Atrium: Right atrial size was normal in size Pericardium: There is no evidence of pericardial effusion. Mitral Valve: The mitral valve is normal in structure. No evidence  of mitral valve regurgitation. No evidence of mitral valve stenosis by observation. Tricuspid Valve: The tricuspid valve is normal in structure. Tricuspid valve regurgitation is not demonstrated. Aortic Valve: The aortic valve is normal in structure. Aortic valve regurgitation is not visualized. The aortic valve is structurally normal, with no evidence of sclerosis or stenosis. Pulmonic Valve: The pulmonic valve was normal in structure. Pulmonic valve regurgitation is not visualized. Pulmonic regurgitation is not visualized. Aorta: The aortic root, ascending aorta and aortic arch are all structurally normal, with no evidence of dilitation or obstruction. Venous: The inferior vena cava is dilated in size with less than 50% respiratory variability, suggesting right atrial pressure of 15 mmHg. IAS/Shunts: No atrial level shunt detected by color flow Doppler. There is no evidence of a patent foramen ovale. No ventricular septal defect is seen or detected. There is no evidence of an atrial septal defect.  LEFT VENTRICLE PLAX 2D  LVIDd:         3.50 cm  Diastology LVIDs:         2.50 cm  LV e' lateral:   10.10 cm/s LV PW:         1.15 cm  LV E/e' lateral: 10.4 LV IVS:        1.10 cm  LV e' medial:    16.40 cm/s LVOT diam:     2.10 cm  LV E/e' medial:  6.4 LV SV:         29 ml LV SV Index:   12.17 LVOT Area:     3.46 cm  RIGHT VENTRICLE RV S prime:     12.50 cm/s TAPSE (M-mode): 1.6 cm LEFT ATRIUM         Index LA diam:    2.80 cm 1.25 cm/m  AORTIC VALVE LVOT Vmax:   54.80 cm/s LVOT Vmean:  41.800 cm/s LVOT VTI:    0.078 m  AORTA Ao Root diam: 2.90 cm MITRAL VALVE MV Area (PHT): 4.29 cm             SHUNTS MV PHT:        51.33 msec           Systemic VTI:  0.08 m MV Decel Time: 177 msec             Systemic Diam: 2.10 cm MV E velocity: 105.00 cm/s 103 cm/s  Mihai Croitoru MD Electronically signed by Sanda Klein MD Signature Date/Time: 09/27/2019/11:59:12 AM    Final      Assessment and Plan:   Troponin elevation: High-sensitivity troponin to 10K.  Given lack of chest pain and no wall motion abnormality on TTE, do not suspect an acute coronary syndrome.  Likely demand ischemia in the setting of critical illness.  However he could very well have underlying CAD given his uncontrolled diabetes (A1c 10.2).  Can consider ischemia evaluation once recovers from acute illness.  He is currently on heparin drip, would be reasonable to continue for 48 hours.  Continue daily aspirin 81 mg, atorvastatin, and carvedilol.  For questions or updates, please contact [place] Please consult [email or web address] for contact info under     Signed, Donato Heinz, MD  09/28/2019 7:26 AM

## 2019-09-28 NOTE — Progress Notes (Signed)
ANTICOAGULATION CONSULT NOTE - Initial Consult  Pharmacy Consult for Heparin Indication: chest pain/ACS  Allergies  Allergen Reactions  . Oxycontin [Oxycodone Hcl] Nausea And Vomiting  . Penicillins Other (See Comments)    Possible reaction many years ago per patient  . Prednisone Other (See Comments)    Patient is diabetic, runs sugar up  . Shellfish-Derived Products     Pt is not allergic to iodine, has had iodine in the past w/o premeds and w/ no problems    Patient Measurements: Height: 5\' 9"  (175.3 cm) Weight: 240 lb 4.8 oz (109 kg) IBW/kg (Calculated) : 70.7 HEPARIN DW (KG): 94.6   Vital Signs: Temp: 97.6 F (36.4 C) (12/25 1200) Temp Source: Oral (12/25 1200) BP: 126/86 (12/25 0806) Pulse Rate: 103 (12/25 0806)  Labs: Recent Labs    09/27/19 0200 09/27/19 0209 09/27/19 0312 09/27/19 1235 09/27/19 1619 09/27/19 1951 09/28/19 0241 09/28/19 1159  HGB 11.8* 13.3  --   --   --   --  12.3*  --   HCT 41.9 39.0  --   --   --   --  38.4*  --   PLT 305  --   --   --   --   --  258  --   HEPARINUNFRC  --   --   --   --   --   --  0.27* 0.37  CREATININE 4.57* 4.20*  --  4.33* 4.30* 4.20*  --   --   TROPONINIHS  --   --    < > 7,715* 9,332* 10,640*  --   --    < > = values in this interval not displayed.    Estimated Creatinine Clearance: 26.4 mL/min (A) (by C-G formula based on SCr of 4.2 mg/dL (H)).   Medical History: Past Medical History:  Diagnosis Date  . CKD (chronic kidney disease), stage II   . Gastroparesis   . Herniated cervical disc   . HTN (hypertension)   . Hypercholesteremia   . S/P cardiac cath    a. 10-15 yrs ago at [facility] due to tachycardia, reportedly normal. b. Normal ETT 03/2012.  Marland Kitchen Sinus tachycardia    a. 24-hr Holter 03/2012 - SR, occ PVCs, no VT, avg HR 96bpm.  . TIA (transient ischemic attack)   . Type 1 diabetes mellitus ([place])    a. With insulin pump.    Medications:  Infusions:  . heparin 1,400 Units/hr (09/28/19 1238)  . insulin  Stopped (09/28/19 0441)   -No anticoagulants PTA  Assessment: 48 yo M admitted with DKA.  Now with elevated troponin. Pharmacy consulted to start IV heparin. Baseline labs- CBC WNL No bleeding noted.   09/28/2019:  Heparin level therapeutic (0.37) on 1400 units/hr  CBC WNL  No bleeding or infusion related issues reported by RN.  Goal of Therapy:  Heparin level 0.3-0.7 units/ml Monitor platelets by anticoagulation protocol: Yes   Plan:  Continue Heparin infusion at 1400 units/hr Recheck confirmatory heparin level at 5pm Daily heparin level & CBC while on heparin- noted current plans to stop 12/26 PM Monitor for s/sx of bleeding  Netta Cedars, PharmD, BCPS 09/28/2019,12:55 PM

## 2019-09-28 NOTE — Progress Notes (Signed)
[facility] Progress Note Patient Name: [name] DOB: [birth year] MRN: [id number]   Date of Service  09/28/2019  HPI/Events of Note  AG & BHB resolved  eICU Interventions  Transition off insulin gtt per endotool SSI - moderate Dc D5 fluid & change to LR instead clears     Intervention Category Major Interventions: Hyperglycemia - active titration of insulin therapy  Shekinah Pitones V. Keary Hanak 09/28/2019, 2:28 AM

## 2019-09-28 NOTE — Progress Notes (Signed)
[facility] for Heparin Indication: chest pain/ACS  Allergies  Allergen Reactions  . Oxycontin [Oxycodone Hcl] Nausea And Vomiting  . Penicillins Other (See Comments)    Possible reaction many years ago per patient  . Prednisone Other (See Comments)    Patient is diabetic, runs sugar up  . Shellfish-Derived Products     Pt is not allergic to iodine, has had iodine in the past w/o premeds and w/ no problems    Patient Measurements: Height: 5\' 9"  (175.3 cm) Weight: 240 lb 4.8 oz (109 kg) IBW/kg (Calculated) : 70.7 HEPARIN DW (KG): 94.6   Vital Signs: Temp: 97.6 F (36.4 C) (12/25 1936) Temp Source: Oral (12/25 1936) BP: 130/54 (12/25 2110) Pulse Rate: 93 (12/25 2110)  Labs: Recent Labs    09/27/19 0200 09/27/19 0209 09/27/19 0312 09/27/19 1619 09/27/19 1951 09/28/19 0241 09/28/19 1159 09/28/19 1310 09/28/19 1733  HGB 11.8* 13.3  --   --   --  12.3*  --   --   --   HCT 41.9 39.0  --   --   --  38.4*  --   --   --   PLT 305  --   --   --   --  258  --   --   --   HEPARINUNFRC  --   --   --   --   --  0.27* 0.37  --  0.64  CREATININE 4.57* 4.20*  --  4.30* 4.20*  --   --  4.02*  --   TROPONINIHS  --   --    < > 9,332* 10,640*  --   --  3,150*  --    < > = values in this interval not displayed.    Estimated Creatinine Clearance: 27.6 mL/min (A) (by C-G formula based on SCr of 4.02 mg/dL (H)).   Medications:  Infusions:  . heparin 1,400 Units/hr (09/28/19 1238)  . insulin Stopped (09/28/19 0441)  . niCARDipine 12.5 mg/hr (09/28/19 2111)  . ondansetron (ZOFRAN) IV Stopped (09/28/19 1710)   -No anticoagulants PTA  Assessment: 48 yo M admitted with DKA.  Now with elevated troponin. Pharmacy consulted to start IV heparin. Baseline labs- CBC WNL No bleeding noted.   09/28/2019:  Confirmatory heparin level therapeutic on 1400 units/hr but rose from low end to high end of therapeutic range  Hgb slightly low, Plt WNL  No  bleeding or infusion related issues reported by RN.  Goal of Therapy:  Heparin level 0.3-0.7 units/ml Monitor platelets by anticoagulation protocol: Yes   Plan:   Reduce heparin infusion to 1350 units/hr  Recheck confirmatory heparin level with AM labs  Daily heparin level & CBC while on heparin- noted current plans to stop 12/26 PM  Monitor for s/sx of bleeding  Reuel Boom, PharmD, BCPS [phone] 09/28/2019, 9:23 PM

## 2019-09-28 NOTE — Progress Notes (Signed)
NAME:  [name], MRN:  [id number], DOB:  1971/08/16, LOS: 1 ADMISSION DATE:  09/27/2019, CONSULTATION DATE:  09/28/19 REFERRING MD:  Shanon Rosser, MD, CHIEF COMPLAINT:  DKA   Brief History   [name] is a 48 y.o. male with h/o DM1 and CKD4 presenting with DKA after removing his insulin pump for the last few months. Blood sugar >1100 on arrival. Has history of DKA in the past. No infectious symptoms present on arrival.  History of present illness   [name] is a 48 y.o. 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 Brought in by EMS drowsy but alert with apparent Kussmaul breathing. He complained of severe thirst and increased urination. Has not taken any insulin today. The day prior to presentation, he reports that his blood sugars were high (280s). He stopped using his insulin pump some months ago because he had a 35 lb intentional weight loss and reported that his blood sugars have been much improved recently. He does endorse prior history of diabetic ketoacidosis. He denies fever, chills, runny nose, sore throat, cough, chest pain, chest tightness, palpitations, vomiting, diarrhea or dysuria. He endorses polydipsia, polyuria and nausea. Denies alcohol use.  In the ED, he is afebrile, in sinus tachycardia and saturating comfortably on 2L/min via nasal cannula. Labs notable for initial blood glucose of 1104. Corrected sodium 144.7, K 7.2, CO2 5,  BUN 63, Creatinine 4.57 (from 3.02 most recently in October 2020). Beta-hydroxy >8.0. WBC 15.1, Hgb 11.8, plt 305. Initial EKG shows sinus tachycardia with peaked T waves. Initial VBG showed pH 7.036, pCO2 23. He was given 2.1 L NS bolus and started on insulin infusion while in the ED. He was also given sodium bicarb x2 and calcium chloride x1.  Past Medical History  DM1 with retinopathy and nephropathy CKD  stage IV HTN HLD  Significant Hospital Events   N/A  Consults:  PCCM  Procedures:  N/A  Significant Diagnostic Tests:  EKG 12/24: sinus tachycardia, peaked T waves, no ischemic changes CXR 12/24: persistent left hemidiaphragmatic elevation, no acute findings  Micro Data:  Blood cultures not yet collected UA  Not yet collected   Antimicrobials:  None   Interval events:  Transition to [place] insulin overnight.  Complains of ongoing lower back pain that has been going on for several days prior to admission.  No weakness or sensation abnormalities.  No recent injuries predating his back pain-started after picking up his dog.  Some ongoing nausea, poor appetite.  Objective   Blood pressure 126/86, pulse (!) 103, temperature 97.6 F (36.4 C), temperature source Oral, resp. rate 19, height 5\' 9"  (1.753 m), weight 109 kg, SpO2 99 %.        Intake/Output Summary (Last 24 hours) at 09/28/2019 1213 Last data filed at 09/28/2019 0600 Gross per 24 hour  Intake 1897.25 ml  Output 1550 ml  Net 347.25 ml   Filed Weights   09/27/19 0600  Weight: 109 kg    Examination: General: Middle-age man lying in bed in no acute distress, more alert today. HENT: [place]/AT, eyes anicteric.  Oral mucosa dry Lungs: Breathing comfortably on nasal cannula, CTAB Cardiovascular: Regular rate and rhythm Abdomen: Soft, nontender, nondistended Extremities: No edema, clubbing, cyanosis Neuro: Awake and alert, normal speech.  Answering questions appropriately.  Moving extremities spontaneously.  Intact sensation.  Pain over left SI joint and sacrum, lower back  muscles Derm: No rashes or ecchymoses.  Good skin turgor.   Assessment & Plan:  Severe diabetic ketoacidosis-resolved.  Reports being uncontrolled for several weeks.  DKA precipitated by noncompliance.  A1c 10.2-uncontrolled at baseline. -Continue basal bolus insulin.  Goal BG 140-180 while admitted to the ICU. -Accu-Cheks every 4 hours with sliding  scale insulin as needed -Needs more aggressive outpatient management -Diabetes coordinator consulted  Elevated troponin- demand ischemia due to significant stress of DKA versus less likely ACS.  Significant troponin elevation.  No wall motion abnormalities on echocardiogram. -Repeat troponin pending -Appreciate cardiology's assistance.  Needs outpatient ischemia evaluation. -Continue heparin for 48 hours (until 12/26 PM) -Continue atorvastatin, aspirin daily -Continue Coreg at reduced dose given currently low blood pressures.  Will increase as blood pressure rises. -Continue to monitor on telemetry  AKI on CKD Hyperkalemia with peaked T waves -Continue oral rehydration -BMP pending -Continue holding PTA losartan -Monitor I/O and record -Avoid nephrotoxic meds and renally dose meds. -Needs better outpatient glucose management  Hypertension -Continue holding PTA losartan -Continue low-dose Coreg  Hyperlipidemia -Continue PTA atorvastatin  Chronic macrocytic anemia -Continue to monitor -Needs outpatient evaluation -Continue multivitamin  Nausea -Zofran and Phenergan as needed   Will stepdown to Dr. Lupita Leash with [facility] tomorrow.  Stable for transfer to stepdown status.   Best practice:  Diet: sips & chips Pain/Anxiety/Delirium protocol (if indicated): N/A VAP protocol (if indicated): N/A DVT prophylaxis: SQH GI prophylaxis: N/A Glucose control: IV insulin Mobility: Bed rest Code Status: Full Code Family Communication: None Disposition: ICU  Labs   CBC: Recent Labs  Lab 09/27/19 0200 09/27/19 0209 09/28/19 0241  WBC 15.1*  --  15.2*  NEUTROABS 11.4*  --   --   HGB 11.8* 13.3 12.3*  HCT 41.9 39.0 38.4*  MCV 102.2*  --  89.1  PLT 305  --  676    Basic Metabolic Panel: Recent Labs  Lab 09/27/19 0312 09/27/19 0849 09/27/19 1235 09/27/19 1619 09/27/19 1951  NA 126* 131* 134* 137 139  K 7.2* 4.5 4.0 3.6 4.3  CL 90* 97* 101 104 106  CO2 5* 12* 20* 23 22   GLUCOSE 1,035* 736* 532* 290* 100*  BUN 62* 62* 61* 60* 57*  CREATININE 4.51* 4.42* 4.33* 4.30* 4.20*  CALCIUM 8.0* 8.3* 8.1* 8.3* 8.2*   GFR: Estimated Creatinine Clearance: 26.4 mL/min (A) (by C-G formula based on SCr of 4.2 mg/dL (H)). Recent Labs  Lab 09/27/19 0200 09/28/19 0241  WBC 15.1* 15.2*    Liver Function Tests: No results for input(s): AST, ALT, ALKPHOS, BILITOT, PROT, ALBUMIN in the last 168 hours. No results for input(s): LIPASE, AMYLASE in the last 168 hours. No results for input(s): AMMONIA in the last 168 hours.  ABG    Component Value Date/Time   HCO3 14.3 (L) 09/27/2019 0849   TCO2 8 (L) 09/27/2019 0209   ACIDBASEDEF 12.3 (H) 09/27/2019 0849   O2SAT 65.0 09/27/2019 0849     Coagulation Profile: No results for input(s): INR, PROTIME in the last 168 hours.  Cardiac Enzymes: No results for input(s): CKTOTAL, CKMB, CKMBINDEX, TROPONINI in the last 168 hours.  HbA1C: Hgb A1c MFr Bld  Date/Time Value Ref Range Status  09/27/2019 02:00 AM 10.2 (H) 4.8 - 5.6 % Final    Comment:    (NOTE)         Prediabetes: 5.7 - 6.4         Diabetes: >6.4         Glycemic control  for adults with diabetes: <7.0   08/29/2018 04:31 AM 10.9 (H) 4.8 - 5.6 % Final    Comment:    (NOTE) Pre diabetes:          5.7%-6.4% Diabetes:              >6.4% Glycemic control for   <7.0% adults with diabetes     CBG: Recent Labs  Lab 09/28/19 0132 09/28/19 0334 09/28/19 0437 09/28/19 0801 09/28/19 1142  GLUCAP 150* 180* 201* 192* 141*       Julian Hy, DO 09/28/19 12:13 PM [facility]

## 2019-09-29 DIAGNOSIS — I169 Hypertensive crisis, unspecified: Secondary | ICD-10-CM

## 2019-09-29 DIAGNOSIS — I10 Essential (primary) hypertension: Secondary | ICD-10-CM

## 2019-09-29 DIAGNOSIS — N184 Chronic kidney disease, stage 4 (severe): Secondary | ICD-10-CM

## 2019-09-29 LAB — BASIC METABOLIC PANEL
Anion gap: 9 (ref 5–15)
BUN: 47 mg/dL — ABNORMAL HIGH (ref 6–20)
CO2: 25 mmol/L (ref 22–32)
Calcium: 8.3 mg/dL — ABNORMAL LOW (ref 8.9–10.3)
Chloride: 103 mmol/L (ref 98–111)
Creatinine, Ser: 3.51 mg/dL — ABNORMAL HIGH (ref 0.61–1.24)
GFR calc Af Amer: 23 mL/min — ABNORMAL LOW (ref >60)
GFR calc non Af Amer: 20 mL/min — ABNORMAL LOW (ref >60)
Glucose, Bld: 115 mg/dL — ABNORMAL HIGH (ref 70–99)
Potassium: 4.2 mmol/L (ref 3.5–5.1)
Sodium: 137 mmol/L (ref 135–145)

## 2019-09-29 LAB — CBC
HCT: 38.1 % — ABNORMAL LOW (ref 39.0–52.0)
Hemoglobin: 12.3 g/dL — ABNORMAL LOW (ref 13.0–17.0)
MCH: 29.1 pg (ref 26.0–34.0)
MCHC: 32.3 g/dL (ref 30.0–36.0)
MCV: 90.3 fL (ref 80.0–100.0)
Platelets: 235 10*3/uL (ref 150–400)
RBC: 4.22 MIL/uL (ref 4.22–5.81)
RDW: 14.2 % (ref 11.5–15.5)
WBC: 11.9 10*3/uL — ABNORMAL HIGH (ref 4.0–10.5)
nRBC: 0 % (ref 0.0–0.2)

## 2019-09-29 LAB — GLUCOSE, CAPILLARY
Glucose-Capillary: 103 mg/dL — ABNORMAL HIGH (ref 70–99)
Glucose-Capillary: 107 mg/dL — ABNORMAL HIGH (ref 70–99)
Glucose-Capillary: 133 mg/dL — ABNORMAL HIGH (ref 70–99)
Glucose-Capillary: 135 mg/dL — ABNORMAL HIGH (ref 70–99)
Glucose-Capillary: 138 mg/dL — ABNORMAL HIGH (ref 70–99)
Glucose-Capillary: 82 mg/dL (ref 70–99)

## 2019-09-29 LAB — HEPARIN LEVEL (UNFRACTIONATED): Heparin Unfractionated: 0.67 IU/mL (ref 0.30–0.70)

## 2019-09-29 MED ORDER — SODIUM CHLORIDE 0.9 % IV SOLN
INTRAVENOUS | Status: DC | PRN
  Administered 2019-09-29 – 2019-09-30 (×2): 250 mL via INTRAVENOUS

## 2019-09-29 MED ORDER — AMLODIPINE BESYLATE 10 MG PO TABS
10.0000 mg | ORAL_TABLET | Freq: Every day | ORAL | Status: DC
  Administered 2019-09-29 – 2019-10-03 (×5): 10 mg via ORAL
  Filled 2019-09-29 (×5): qty 1

## 2019-09-29 MED ORDER — ALUM & MAG HYDROXIDE-SIMETH 200-200-20 MG/5ML PO SUSP
30.0000 mL | ORAL | Status: DC | PRN
  Administered 2019-09-30 – 2019-10-01 (×2): 30 mL via ORAL
  Filled 2019-09-29 (×4): qty 30

## 2019-09-29 NOTE — Progress Notes (Signed)
PROGRESS NOTE  [name] DXA:[id number] DOB: [birth year] DOA: 09/27/2019 PCP: [name], MD  HPI/Recap of past 24 hours: HPI from [name] is a 48 y.o. male with h/o DM1 and CKD4 presenting with DKA after removing his insulin pump for the last few months. Blood sugar >1100 on arrival. Has history of DKA in the past. No infectious symptoms present on arrival. In the ED, pt was afebrile, in sinus tachycardia. Labs notable for initial blood glucose of 1104. Corrected sodium 144.7, K 7.2, CO2 5,  BUN 63, Creatinine 4.57 (from 3.02 most recently in October 2020). Beta-hydroxy >8.0. WBC 15.1, Hgb 11.8, plt 305. Initial EKG shows sinus tachycardia with peaked T waves. Initial VBG showed pH 7.036, pCO2 23. He was given 2.1 L NS bolus and started on insulin infusion while in the ED. He was also given sodium bicarb x2 and calcium chloride x1.  Patient admitted by [facility] for further management.  [facility] hospitalist assumed care on 09/29/2019.     Today, patient denies any new complaints, complains of chronic back pain.  Patient denies any chest pain, shortness of breath, nausea/vomiting, abdominal pain, fever/chills.  Assessment/Plan: Active Problems:   DKA, type 1 ([place])  Diabetic ketoacidosis with a history of type I DM Likely 2/2 noncompliance (stopped using his insulin pump due to recent weight loss) A1c 10.2 S/p insulin drip Continue SSI, Accu-Chek, detemir, hypoglycemic protocol Diabetes coordinator on board Educated patient on the need to be compliant with his insulin regimen despite his weight loss due to type 1 diabetes  AKI on CKD stage IV Likely due to above Baseline creatinine around 2.4, currently improving Daily BMP  Hypertension Had an episode of crisis overnight on 09/28/19 Started on nicardipine drip, plan to wean off Continue to hold home losartan due to AKI, continue Coreg, start home amlodipine, will increase to 10 mg daily  Elevated  troponin Currently denies any chest pain Likely 2/2 demand ischemia Troponin trending downwards EKG with no acute ST changes Echo with EF of 70 to 75%, hyperdynamic function, with no RWMA Cardiology on board, recommend continued Heparin for 48 hours, will need outpatient ischemic evaluation Continue Lipitor, aspirin, Coreg Continue telemetry monitoring  Leukocytosis Afebrile, trending downwards Daily CBC  OSA Continue CPAP  Obesity Lifestyle modification advised       Malnutrition Type:      Malnutrition Characteristics:      Nutrition Interventions:       Estimated body mass index is 35.49 kg/m as calculated from the following:   Height as of this encounter: 5\' 9"  (1.753 m).   Weight as of this encounter: 109 kg.     Code Status: Full  Family Communication: None at bedside  Disposition Plan: Likely home   Consultants:  Cardiology  Procedures:  None  Antimicrobials:  None  DVT prophylaxis: Heparin drip   Objective: Vitals:   09/29/19 1045 09/29/19 1115 09/29/19 1200 09/29/19 1300  BP: 131/66 129/69  135/73  Pulse: 85 83  81  Resp: 15 17  11   Temp:   97.9 F (36.6 C)   TempSrc:   Oral   SpO2: 100% 99%  95%  Weight:      Height:        Intake/Output Summary (Last 24 hours) at 09/29/2019 1310 Last data filed at 09/29/2019 1100 Gross per 24 hour  Intake 2261 ml  Output 1775 ml  Net 486 ml   Filed Weights   09/27/19 0600  Weight: 109  kg    Exam:  General: NAD   Cardiovascular: S1, S2 present  Respiratory: CTAB  Abdomen: Soft, nontender, nondistended, bowel sounds present  Musculoskeletal: No bilateral pedal edema noted  Skin: Normal  Psychiatry: Normal mood   Data Reviewed: CBC: Recent Labs  Lab 09/27/19 0200 09/27/19 0209 09/28/19 0241 09/29/19 0218  WBC 15.1*  --  15.2* 11.9*  NEUTROABS 11.4*  --   --   --   HGB 11.8* 13.3 12.3* 12.3*  HCT 41.9 39.0 38.4* 38.1*  MCV 102.2*  --  89.1 90.3  PLT 305   --  258 573   Basic Metabolic Panel: Recent Labs  Lab 09/27/19 1235 09/27/19 1619 09/27/19 1951 09/28/19 1310 09/29/19 0218  NA 134* 137 139 139 137  K 4.0 3.6 4.3 4.6 4.2  CL 101 104 106 106 103  CO2 20* 23 22 25 25   GLUCOSE 532* 290* 100* 149* 115*  BUN 61* 60* 57* 55* 47*  CREATININE 4.33* 4.30* 4.20* 4.02* 3.51*  CALCIUM 8.1* 8.3* 8.2* 8.4* 8.3*   GFR: Estimated Creatinine Clearance: 31.6 mL/min (A) (by C-G formula based on SCr of 3.51 mg/dL (H)). Liver Function Tests: No results for input(s): AST, ALT, ALKPHOS, BILITOT, PROT, ALBUMIN in the last 168 hours. No results for input(s): LIPASE, AMYLASE in the last 168 hours. No results for input(s): AMMONIA in the last 168 hours. Coagulation Profile: No results for input(s): INR, PROTIME in the last 168 hours. Cardiac Enzymes: No results for input(s): CKTOTAL, CKMB, CKMBINDEX, TROPONINI in the last 168 hours. BNP (last 3 results) No results for input(s): PROBNP in the last 8760 hours. HbA1C: Recent Labs    09/27/19 0200  HGBA1C 10.2*   CBG: Recent Labs  Lab 09/28/19 1929 09/28/19 2320 09/29/19 0356 09/29/19 0816 09/29/19 1143  GLUCAP 93 109* 82 133* 138*   Lipid Profile: No results for input(s): CHOL, HDL, LDLCALC, TRIG, CHOLHDL, LDLDIRECT in the last 72 hours. Thyroid Function Tests: Recent Labs    09/27/19 0200  TSH 2.019   Anemia Panel: No results for input(s): VITAMINB12, FOLATE, FERRITIN, TIBC, IRON, RETICCTPCT in the last 72 hours. Urine analysis:    Component Value Date/Time   COLORURINE YELLOW 09/28/2019 1812   APPEARANCEUR CLEAR 09/28/2019 1812   LABSPEC 1.013 09/28/2019 1812   PHURINE 5.0 09/28/2019 1812   GLUCOSEU 150 (A) 09/28/2019 1812   HGBUR MODERATE (A) 09/28/2019 1812   BILIRUBINUR NEGATIVE 09/28/2019 1812   KETONESUR 5 (A) 09/28/2019 1812   PROTEINUR >=300 (A) 09/28/2019 1812   UROBILINOGEN 0.2 06/07/2014 0151   NITRITE NEGATIVE 09/28/2019 1812   LEUKOCYTESUR NEGATIVE 09/28/2019  1812   Sepsis Labs: @LABRCNTIP (procalcitonin:4,lacticidven:4)  ) Recent Results (from the past 240 hour(s))  Respiratory Panel by RT PCR (Flu A&B, Covid) - Nasopharyngeal Swab     Status: None   Collection Time: 09/27/19  2:34 AM   Specimen: Nasopharyngeal Swab  Result Value Ref Range Status   SARS Coronavirus 2 by RT PCR NEGATIVE NEGATIVE Final    Comment: (NOTE) SARS-CoV-2 target nucleic acids are NOT DETECTED. The SARS-CoV-2 RNA is generally detectable in upper respiratoy specimens during the acute phase of infection. The lowest concentration of SARS-CoV-2 viral copies this assay can detect is 131 copies/mL. A negative result does not preclude SARS-Cov-2 infection and should not be used as the sole basis for treatment or other patient management decisions. A negative result may occur with  improper specimen collection/handling, submission of specimen other than nasopharyngeal swab, presence of viral mutation(s)  within the areas targeted by this assay, and inadequate number of viral copies (<131 copies/mL). A negative result must be combined with clinical observations, patient history, and epidemiological information. The expected result is Negative. Fact Sheet for Patients:  PinkCheek.be Fact Sheet for Healthcare Providers:  GravelBags.it This test is not yet ap proved or cleared by the [place] FDA and  has been authorized for detection and/or diagnosis of SARS-CoV-2 by FDA under an Emergency Use Authorization (EUA). This EUA will remain  in effect (meaning this test can be used) for the duration of the COVID-19 declaration under Section 564(b)(1) of the Act, 21 U.S.C. section 360bbb-3(b)(1), unless the authorization is terminated or revoked sooner.    Influenza A by PCR NEGATIVE NEGATIVE Final   Influenza B by PCR NEGATIVE NEGATIVE Final    Comment: (NOTE) The Xpert Xpress SARS-CoV-2/FLU/RSV assay is intended  as an aid in  the diagnosis of influenza from Nasopharyngeal swab specimens and  should not be used as a sole basis for treatment. Nasal washings and  aspirates are unacceptable for Xpert Xpress SARS-CoV-2/FLU/RSV  testing. Fact Sheet for Patients: PinkCheek.be Fact Sheet for Healthcare Providers: GravelBags.it This test is not yet approved or cleared by the [place] FDA and  has been authorized for detection and/or diagnosis of SARS-CoV-2 by  FDA under an Emergency Use Authorization (EUA). This EUA will remain  in effect (meaning this test can be used) for the duration of the  Covid-19 declaration under Section 564(b)(1) of the Act, 21  U.S.C. section 360bbb-3(b)(1), unless the authorization is  terminated or revoked. Performed at [facility], [place] [address], [place], [place] [address]   MRSA PCR Screening     Status: None   Collection Time: 09/27/19  5:35 AM   Specimen: Nasal Mucosa; Nasopharyngeal  Result Value Ref Range Status   MRSA by PCR NEGATIVE NEGATIVE Final    Comment:        The GeneXpert MRSA Assay (FDA approved for NASAL specimens only), is one component of a comprehensive MRSA colonization surveillance program. It is not intended to diagnose MRSA infection nor to guide or monitor treatment for MRSA infections. Performed at [facility], [place] [address], [place], [place] [address]   Culture, blood (routine x 2)     Status: None (Preliminary result)   Collection Time: 09/27/19  8:42 AM   Specimen: BLOOD RIGHT HAND  Result Value Ref Range Status   Specimen Description   Final    BLOOD RIGHT HAND Performed at [facility] [address], [place], [place] [address]    Special Requests   Final    BOTTLES DRAWN AEROBIC ONLY Blood Culture results may not be optimal due to an inadequate volume of blood received in culture bottles Performed at  [facility] [address], [place], [place] [address]    Culture   Final    NO GROWTH 2 DAYS Performed at [facility] [address], [place], [place] [address]    Report Status PENDING  Incomplete  Culture, blood (routine x 2)     Status: None (Preliminary result)   Collection Time: 09/27/19  8:49 AM   Specimen: BLOOD LEFT HAND  Result Value Ref Range Status   Specimen Description   Final    BLOOD LEFT HAND Performed at [facility] [address] [place], [place] [address]    Special Requests   Final    BOTTLES  DRAWN AEROBIC ONLY Blood Culture results may not be optimal due to an inadequate volume of blood received in culture bottles Performed at [facility], [place] [address], [place], [name] [address]    Culture   Final    NO GROWTH 2 DAYS Performed at [facility] [address] [place], [place] [address]    Report Status PENDING  Incomplete      Studies: US RENAL  Result Date: 09/28/2019 CLINICAL DATA:  48 year old male with acute renal insufficiency. History of diabetes and hypertension. EXAM: RENAL / URINARY TRACT ULTRASOUND COMPLETE COMPARISON:  Renal ultrasound dated 07/13/2017. FINDINGS: Right Kidney: Renal measurements: 11.7 x 6.0 x 6.7 cm = volume: 244. ML. There is increased renal parenchymal echogenicity. No hydronephrosis or shadowing stone. Left Kidney: Renal measurements: 11.1 x 6.2 x 5.8 cm = volume: 209 mL. There is increased renal parenchymal echogenicity. No hydronephrosis or shadowing stone. Bladder: The urinary bladder is distended otherwise unremarkable Other: None. IMPRESSION: Echogenic kidneys in keeping with chronic kidney disease. No hydronephrosis or shadowing stone. Electronically Signed   By: Anner Crete M.D.   On: 09/28/2019 19:19    Scheduled Meds: . aspirin EC  81 mg Oral Daily  . atorvastatin  80 mg Oral q1800  . carvedilol  3.125 mg Oral BID WC  . Chlorhexidine  Gluconate Cloth  6 each Topical Daily  . insulin aspart  2-6 Units Subcutaneous Q4H  . insulin detemir  15 Units Subcutaneous Q12H  . mouth rinse  15 mL Mouth Rinse BID  . multivitamin with minerals  1 tablet Oral Daily    Continuous Infusions: . heparin 1,300 Units/hr (09/29/19 1100)  . niCARDipine 10 mg/hr (09/29/19 1304)  . ondansetron (ZOFRAN) IV Stopped (09/28/19 2350)     LOS: 2 days     Alma Friendly, MD [facility]  If 7PM-7AM, please contact night-coverage [email or web address] 09/29/2019, 1:10 PM

## 2019-09-29 NOTE — Progress Notes (Signed)
This writer will return around 2300 per patient's request to assist him with cpap.  RN aware.

## 2019-09-29 NOTE — Progress Notes (Signed)
Progress Note  Patient Name: [name] Date of Encounter: 09/29/2019  Primary Cardiologist: No primary care provider on file. Dr. Gardiner Rhyme  Subjective   Still no complaints of chest discomfort.  Does feel fatigued, tired.  Blood pressure was highly elevated overnight started on Cardene drip.  Successful.  Inpatient Medications    Scheduled Meds:  aspirin EC  81 mg Oral Daily   atorvastatin  80 mg Oral q1800   carvedilol  3.125 mg Oral BID WC   Chlorhexidine Gluconate Cloth  6 each Topical Daily   insulin aspart  2-6 Units Subcutaneous Q4H   insulin detemir  15 Units Subcutaneous Q12H   mouth rinse  15 mL Mouth Rinse BID   multivitamin with minerals  1 tablet Oral Daily   Continuous Infusions:  heparin 1,300 Units/hr (09/29/19 0855)   niCARDipine 12.5 mg/hr (09/29/19 0855)   ondansetron (ZOFRAN) IV Stopped (09/28/19 2350)   PRN Meds: acetaminophen, dextrose, ondansetron (ZOFRAN) IV, phenol, promethazine   Vital Signs    Vitals:   09/29/19 0630 09/29/19 0700 09/29/19 0730 09/29/19 0815  BP: 135/70 (!) 130/59 125/70 134/72  Pulse: 84 87 85   Resp: 15 (!) 8 (!) 8 14  Temp:    98 F (36.7 C)  TempSrc:    Oral  SpO2: 99% 100% 98% 98%  Weight:      Height:        Intake/Output Summary (Last 24 hours) at 09/29/2019 0913 Last data filed at 09/29/2019 0800 Gross per 24 hour  Intake 1935.87 ml  Output 1775 ml  Net 160.87 ml   Last 3 Weights 09/27/2019 09/21/2019 07/13/2019  Weight (lbs) 240 lb 4.8 oz 234 lb 255 lb 1.2 oz  Weight (kg) 109 kg 106.142 kg 115.7 kg      Telemetry    Sinus rhythm with no adverse arrhythmias- Personally Reviewed  ECG    Original EKG showed peaked T waves, subsequent no ischemic changes sinus rhythm- Personally Reviewed  Physical Exam   GEN: No acute distress.  Tired, wrapped up in blanket Neck: No JVD Cardiac: RRR, no murmurs, rubs, or gallops.  Respiratory: Clear to auscultation bilaterally. GI: Soft,  nontender, non-distended  MS: No edema; No deformity. Neuro:  Nonfocal  Psych: Normal affect   Labs    High Sensitivity Troponin:   Recent Labs  Lab 09/27/19 0842 09/27/19 1235 09/27/19 1619 09/27/19 1951 09/28/19 1310  TROPONINIHS 2,654* 7,715* 9,332* 10,640* 3,150*      Chemistry Recent Labs  Lab 09/27/19 1951 09/28/19 1310 09/29/19 0218  NA 139 139 137  K 4.3 4.6 4.2  CL 106 106 103  CO2 22 25 25   GLUCOSE 100* 149* 115*  BUN 57* 55* 47*  CREATININE 4.20* 4.02* 3.51*  CALCIUM 8.2* 8.4* 8.3*  GFRNONAA 16* 17* 20*  GFRAA 18* 19* 23*  ANIONGAP 11 8 9      Hematology Recent Labs  Lab 09/27/19 0200 09/27/19 0209 09/28/19 0241 09/29/19 0218  WBC 15.1*  --  15.2* 11.9*  RBC 4.10*  --  4.31 4.22  HGB 11.8* 13.3 12.3* 12.3*  HCT 41.9 39.0 38.4* 38.1*  MCV 102.2*  --  89.1 90.3  MCH 28.8  --  28.5 29.1  MCHC 28.2*  --  32.0 32.3  RDW 13.5  --  13.8 14.2  PLT 305  --  258 235    BNPNo results for input(s): BNP, PROBNP in the last 168 hours.   DDimer No results for input(s): DDIMER in  the last 168 hours.   Radiology    US RENAL  Result Date: 09/28/2019 CLINICAL DATA:  48 year old male with acute renal insufficiency. History of diabetes and hypertension. EXAM: RENAL / URINARY TRACT ULTRASOUND COMPLETE COMPARISON:  Renal ultrasound dated 07/13/2017. FINDINGS: Right Kidney: Renal measurements: 11.7 x 6.0 x 6.7 cm = volume: 244. ML. There is increased renal parenchymal echogenicity. No hydronephrosis or shadowing stone. Left Kidney: Renal measurements: 11.1 x 6.2 x 5.8 cm = volume: 209 mL. There is increased renal parenchymal echogenicity. No hydronephrosis or shadowing stone. Bladder: The urinary bladder is distended otherwise unremarkable Other: None. IMPRESSION: Echogenic kidneys in keeping with chronic kidney disease. No hydronephrosis or shadowing stone. Electronically Signed   By: Anner Crete M.D.   On: 09/28/2019 19:19   ECHOCARDIOGRAM COMPLETE  Result  Date: 09/27/2019   ECHOCARDIOGRAM REPORT   Patient Name:   [name] Date of Exam: 09/27/2019 Medical Rec #:  [id number]           Height:       69.0 in Accession #:    [id number]          Weight:       240.3 lb Date of Birth:  03/02/1971           BSA:          2.23 m Patient Age:    29 years            BP:           132/50 mmHg Patient Gender: M                   HR:           102 bpm. Exam Location:  Inpatient Procedure: 2D Echo STAT ECHO Indications:    elevated troponin  History:        Patient has prior history of Echocardiogram examinations, most                 recent 08/29/2018. TIA; Risk Factors:Hypertension, Diabetes and                 Dyslipidemia. Sinus tachycardia.  Sonographer:    Jannett Celestine RDCS (AE) Referring Phys: [id number] [place]  1. Left ventricular ejection fraction, by visual estimation, is 70 to 75%. The left ventricle has hyperdynamic function. There is no left ventricular hypertrophy.  2. The left ventricle has no regional wall motion abnormalities.  3. Global right ventricle has normal systolic function.The right ventricular size is normal. No increase in right ventricular wall thickness.  4. Left atrial size was normal.  5. Right atrial size was normal.  6. The mitral valve is normal in structure. No evidence of mitral valve regurgitation. No evidence of mitral stenosis.  7. The tricuspid valve is normal in structure.  8. The aortic valve is normal in structure. Aortic valve regurgitation is not visualized. No evidence of aortic valve sclerosis or stenosis.  9. The pulmonic valve was normal in structure. Pulmonic valve regurgitation is not visualized. 10. The inferior vena cava is dilated in size with <50% respiratory variability, suggesting right atrial pressure of 15 mmHg. 11. Prior images reviewed side by side. 12. Inferior vena cava is dilated, but otherwise no change from 04/10/2018. FINDINGS  Left Ventricle: Left ventricular ejection fraction, by visual  estimation, is 70 to 75%. The left ventricle has hyperdynamic function. The left ventricle has no regional wall motion abnormalities. There is no left  ventricular hypertrophy. Left ventricular diastolic parameters were normal. Normal left atrial pressure. Right Ventricle: The right ventricular size is normal. No increase in right ventricular wall thickness. Global RV systolic function is has normal systolic function. Left Atrium: Left atrial size was normal in size. Right Atrium: Right atrial size was normal in size Pericardium: There is no evidence of pericardial effusion. Mitral Valve: The mitral valve is normal in structure. No evidence of mitral valve regurgitation. No evidence of mitral valve stenosis by observation. Tricuspid Valve: The tricuspid valve is normal in structure. Tricuspid valve regurgitation is not demonstrated. Aortic Valve: The aortic valve is normal in structure. Aortic valve regurgitation is not visualized. The aortic valve is structurally normal, with no evidence of sclerosis or stenosis. Pulmonic Valve: The pulmonic valve was normal in structure. Pulmonic valve regurgitation is not visualized. Pulmonic regurgitation is not visualized. Aorta: The aortic root, ascending aorta and aortic arch are all structurally normal, with no evidence of dilitation or obstruction. Venous: The inferior vena cava is dilated in size with less than 50% respiratory variability, suggesting right atrial pressure of 15 mmHg. IAS/Shunts: No atrial level shunt detected by color flow Doppler. There is no evidence of a patent foramen ovale. No ventricular septal defect is seen or detected. There is no evidence of an atrial septal defect.  LEFT VENTRICLE PLAX 2D LVIDd:         3.50 cm  Diastology LVIDs:         2.50 cm  LV e' lateral:   10.10 cm/s LV PW:         1.15 cm  LV E/e' lateral: 10.4 LV IVS:        1.10 cm  LV e' medial:    16.40 cm/s LVOT diam:     2.10 cm  LV E/e' medial:  6.4 LV SV:         29 ml LV SV  Index:   12.17 LVOT Area:     3.46 cm  RIGHT VENTRICLE RV S prime:     12.50 cm/s TAPSE (M-mode): 1.6 cm LEFT ATRIUM         Index LA diam:    2.80 cm 1.25 cm/m  AORTIC VALVE LVOT Vmax:   54.80 cm/s LVOT Vmean:  41.800 cm/s LVOT VTI:    0.078 m  AORTA Ao Root diam: 2.90 cm MITRAL VALVE MV Area (PHT): 4.29 cm             SHUNTS MV PHT:        51.33 msec           Systemic VTI:  0.08 m MV Decel Time: 177 msec             Systemic Diam: 2.10 cm MV E velocity: 105.00 cm/s 103 cm/s  Mihai Croitoru MD Electronically signed by Sanda Klein MD Signature Date/Time: 09/27/2019/11:59:12 AM    Final     Cardiac Studies   Echo as above-hyperdynamic left ventricular function 70-75% EF with no wall motion abnormalities  Patient Profile     48 y.o. male with highly elevated high-sensitivity troponin, 16,000 in the setting of diabetic ketoacidosis, representative of myocardial injury in the setting of DKA.  Assessment & Plan    Myocardial injury in the setting of DKA -Thankfully, no wall motion abnormalities noted on echocardiogram.  Normal EF.  Hyperdynamic.  No evidence of effusion. -Continue IV heparin today with plan to discontinue tomorrow. -Elevated troponin does portend a worsened overall prognosis. -At some  point as an outpatient, would provide him with stress test.  Kidney function precludes cardiac catheterization at this time. -Continue with aggressive secondary risk factor prevention-atorvastatin carvedilol aspirin 81  Abnormal EKG with peaked T waves -Potassium 7.2 on arrival with pH of 7.0.  Accelerated hypertension -Per primary team, on Cardene drip.  Currently stable with blood pressure of 001 systolic.      For questions or updates, please contact [place] Please consult [email or web address] for contact info under        Signed, Candee Furbish, MD  09/29/2019, 9:13 AM

## 2019-09-29 NOTE — Progress Notes (Signed)
[place] for Heparin Indication: chest pain/ACS  Allergies  Allergen Reactions  . Oxycontin [Oxycodone Hcl] Nausea And Vomiting  . Penicillins Other (See Comments)    Possible reaction many years ago per patient  . Prednisone Other (See Comments)    Patient is diabetic, runs sugar up  . Shellfish-Derived Products     Pt is not allergic to iodine, has had iodine in the past w/o premeds and w/ no problems    Patient Measurements: Height: 5\' 9"  (175.3 cm) Weight: 240 lb 4.8 oz (109 kg) IBW/kg (Calculated) : 70.7 HEPARIN DW (KG): 94.6   Vital Signs: Temp: 97.9 F (36.6 C) (12/26 0359) Temp Source: Oral (12/26 0359) BP: 135/70 (12/26 0630) Pulse Rate: 84 (12/26 0630)  Labs: Recent Labs    09/27/19 0200 09/27/19 0209 09/27/19 0209 09/27/19 0312 09/27/19 1619 09/27/19 1951 09/28/19 0241 09/28/19 1159 09/28/19 1310 09/28/19 1733 09/29/19 0218  HGB 11.8* 13.3  --   --   --   --  12.3*  --   --   --  12.3*  HCT 41.9 39.0  --   --   --   --  38.4*  --   --   --  38.1*  PLT 305  --   --   --   --   --  258  --   --   --  235  HEPARINUNFRC  --   --    < >  --   --   --  0.27* 0.37  --  0.64 0.67  CREATININE 4.57* 4.20*  --   --  4.30* 4.20*  --   --  4.02*  --  3.51*  TROPONINIHS  --   --   --    < > 9,332* 10,640*  --   --  3,150*  --   --    < > = values in this interval not displayed.    Estimated Creatinine Clearance: 31.6 mL/min (A) (by C-G formula based on SCr of 3.51 mg/dL (H)).   Medications:  Infusions:  . heparin 1,350 Units/hr (09/28/19 2129)  . insulin Stopped (09/28/19 0441)  . niCARDipine 12.5 mg/hr (09/29/19 0648)  . ondansetron (ZOFRAN) IV Stopped (09/28/19 2350)   -No anticoagulants PTA  Assessment: 48 yo M admitted with DKA.  Now with elevated troponin. Pharmacy consulted to start IV heparin. Baseline labs- CBC WNL No bleeding noted.   09/29/2019:  heparin level therapeutic on 1350 units/hr but rose  from low end to high end of therapeutic range  Hgb slightly low, Plt WNL  No bleeding or infusion related issues noted  Goal of Therapy:  Heparin level 0.3-0.7 units/ml Monitor platelets by anticoagulation protocol: Yes   Plan:   Reduce heparin infusion to 1300 units/hr  Daily heparin level & CBC while on heparin- noted current plans to stop 12/26 PM  Monitor for s/sx of bleeding  Dolly Rias RPh 09/29/2019, 7:19 AM

## 2019-09-29 NOTE — Progress Notes (Signed)
Ran the patients blood pressure at start of shift for initial assessment to find it elevated SBP's >200. Patient had just received carvedilol as prescribed an hour prior. Contacted on call physician as patient has prior history of TIA and was also complaining of a headache at a 8/10. Medication orders were placed by physician, he also video conferenced into the room during this time.

## 2019-09-30 LAB — BASIC METABOLIC PANEL
Anion gap: 5 (ref 5–15)
BUN: 37 mg/dL — ABNORMAL HIGH (ref 6–20)
CO2: 23 mmol/L (ref 22–32)
Calcium: 8.1 mg/dL — ABNORMAL LOW (ref 8.9–10.3)
Chloride: 107 mmol/L (ref 98–111)
Creatinine, Ser: 3.08 mg/dL — ABNORMAL HIGH (ref 0.61–1.24)
GFR calc Af Amer: 27 mL/min — ABNORMAL LOW (ref >60)
GFR calc non Af Amer: 23 mL/min — ABNORMAL LOW (ref >60)
Glucose, Bld: 120 mg/dL — ABNORMAL HIGH (ref 70–99)
Potassium: 4.1 mmol/L (ref 3.5–5.1)
Sodium: 135 mmol/L (ref 135–145)

## 2019-09-30 LAB — GLUCOSE, CAPILLARY
Glucose-Capillary: 99 mg/dL (ref 70–99)
Glucose-Capillary: 64 mg/dL — ABNORMAL LOW (ref 70–99)
Glucose-Capillary: 125 mg/dL — ABNORMAL HIGH (ref 70–99)
Glucose-Capillary: 113 mg/dL — ABNORMAL HIGH (ref 70–99)
Glucose-Capillary: 180 mg/dL — ABNORMAL HIGH (ref 70–99)
Glucose-Capillary: 293 mg/dL — ABNORMAL HIGH (ref 70–99)
Glucose-Capillary: 364 mg/dL — ABNORMAL HIGH (ref 70–99)
Glucose-Capillary: 302 mg/dL — ABNORMAL HIGH (ref 70–99)

## 2019-09-30 LAB — CBC
HCT: 35.5 % — ABNORMAL LOW (ref 39.0–52.0)
Hemoglobin: 11.3 g/dL — ABNORMAL LOW (ref 13.0–17.0)
MCH: 28.6 pg (ref 26.0–34.0)
MCHC: 31.8 g/dL (ref 30.0–36.0)
MCV: 89.9 fL (ref 80.0–100.0)
Platelets: 203 10*3/uL (ref 150–400)
RBC: 3.95 MIL/uL — ABNORMAL LOW (ref 4.22–5.81)
RDW: 13.7 % (ref 11.5–15.5)
WBC: 8.8 10*3/uL (ref 4.0–10.5)
nRBC: 0 % (ref 0.0–0.2)

## 2019-09-30 LAB — HEPARIN LEVEL (UNFRACTIONATED): Heparin Unfractionated: 0.48 IU/mL (ref 0.30–0.70)

## 2019-09-30 MED ORDER — HYDRALAZINE HCL 25 MG PO TABS
25.0000 mg | ORAL_TABLET | Freq: Three times a day (TID) | ORAL | Status: DC
  Administered 2019-09-30 – 2019-10-01 (×4): 25 mg via ORAL
  Filled 2019-09-30 (×4): qty 1

## 2019-09-30 MED ORDER — INSULIN DETEMIR 100 UNIT/ML ~~LOC~~ SOLN
12.0000 [IU] | Freq: Every day | SUBCUTANEOUS | Status: DC
  Administered 2019-09-30 – 2019-10-02 (×3): 12 [IU] via SUBCUTANEOUS
  Filled 2019-09-30 (×4): qty 0.12

## 2019-09-30 MED ORDER — HYDRALAZINE HCL 20 MG/ML IJ SOLN
10.0000 mg | Freq: Three times a day (TID) | INTRAMUSCULAR | Status: DC | PRN
  Administered 2019-10-01: 10 mg via INTRAVENOUS
  Filled 2019-09-30: qty 1

## 2019-09-30 MED ORDER — INSULIN DETEMIR 100 UNIT/ML ~~LOC~~ SOLN
15.0000 [IU] | Freq: Every morning | SUBCUTANEOUS | Status: DC
  Administered 2019-10-01 – 2019-10-03 (×3): 15 [IU] via SUBCUTANEOUS
  Filled 2019-09-30 (×3): qty 0.15

## 2019-09-30 MED ORDER — CARVEDILOL 12.5 MG PO TABS
12.5000 mg | ORAL_TABLET | Freq: Once | ORAL | Status: AC
  Administered 2019-09-30: 12.5 mg via ORAL
  Filled 2019-09-30: qty 1

## 2019-09-30 MED ORDER — HEPARIN SODIUM (PORCINE) 5000 UNIT/ML IJ SOLN
5000.0000 [IU] | Freq: Three times a day (TID) | INTRAMUSCULAR | Status: DC
  Administered 2019-09-30 – 2019-10-02 (×8): 5000 [IU] via SUBCUTANEOUS
  Filled 2019-09-30 (×8): qty 1

## 2019-09-30 MED ORDER — INSULIN ASPART 100 UNIT/ML ~~LOC~~ SOLN
0.0000 [IU] | SUBCUTANEOUS | Status: DC
  Administered 2019-09-30: 1 [IU] via SUBCUTANEOUS
  Administered 2019-09-30: 3 [IU] via SUBCUTANEOUS
  Administered 2019-09-30: 4 [IU] via SUBCUTANEOUS
  Administered 2019-10-01: 3 [IU] via SUBCUTANEOUS
  Administered 2019-10-01 (×2): 1 [IU] via SUBCUTANEOUS
  Administered 2019-10-01 (×2): 2 [IU] via SUBCUTANEOUS
  Administered 2019-10-02 – 2019-10-03 (×5): 1 [IU] via SUBCUTANEOUS
  Administered 2019-10-03: 3 [IU] via SUBCUTANEOUS

## 2019-09-30 MED ORDER — CARVEDILOL 12.5 MG PO TABS
12.5000 mg | ORAL_TABLET | Freq: Two times a day (BID) | ORAL | Status: DC
  Administered 2019-09-30 – 2019-10-03 (×6): 12.5 mg via ORAL
  Filled 2019-09-30 (×6): qty 1

## 2019-09-30 NOTE — Progress Notes (Signed)
[place] for Heparin Indication: chest pain/ACS  Allergies  Allergen Reactions  . Oxycontin [Oxycodone Hcl] Nausea And Vomiting  . Penicillins Other (See Comments)    Possible reaction many years ago per patient  . Prednisone Other (See Comments)    Patient is diabetic, runs sugar up  . Shellfish-Derived Products     Pt is not allergic to iodine, has had iodine in the past w/o premeds and w/ no problems    Patient Measurements: Height: 5\' 9"  (175.3 cm) Weight: 240 lb 4.8 oz (109 kg) IBW/kg (Calculated) : 70.7 HEPARIN DW (KG): 94.6   Vital Signs: Temp: 97.4 F (36.3 C) (12/27 0415) Temp Source: Oral (12/27 0415) BP: 163/72 (12/27 0530) Pulse Rate: 85 (12/27 0530)  Labs: Recent Labs    09/27/19 1619 09/27/19 1619 09/27/19 1951 09/28/19 0241 09/28/19 1310 09/28/19 1733 09/29/19 0218 09/30/19 0248  HGB  --    < >  --  12.3*  --   --  12.3* 11.3*  HCT  --   --   --  38.4*  --   --  38.1* 35.5*  PLT  --   --   --  258  --   --  235 203  HEPARINUNFRC  --    < >  --  0.27*  --  0.64 0.67 0.48  CREATININE 4.30*  --  4.20*  --  4.02*  --  3.51* 3.08*  TROPONINIHS 9,332*  --  10,640*  --  3,150*  --   --   --    < > = values in this interval not displayed.    Estimated Creatinine Clearance: 36.1 mL/min (A) (by C-G formula based on SCr of 3.08 mg/dL (H)).   Medications:  Infusions:  . sodium chloride Stopped (09/29/19 1732)  . heparin 1,300 Units/hr (09/29/19 1800)  . niCARDipine 15 mg/hr (09/30/19 0545)  . ondansetron (ZOFRAN) IV Stopped (09/29/19 2346)   -No anticoagulants PTA  Assessment: 48 yo M admitted with DKA.  Now with elevated troponin. Pharmacy consulted to start IV heparin. Baseline labs- CBC WNL No bleeding noted.   09/30/2019:  heparin level therapeutic on 1300 units/hr   Hgb slightly low, Plt WNL  No bleeding or infusion related issues noted  Goal of Therapy:  Heparin level 0.3-0.7 units/ml Monitor  platelets by anticoagulation protocol: Yes   Plan:   continue heparin infusion at 1300 units/hr  Daily heparin level & CBC while on heparin  Monitor for s/sx of bleeding  Dolly Rias RPh 09/30/2019, 7:44 AM

## 2019-09-30 NOTE — Progress Notes (Signed)
Pt refused to wear cpap tonight.  Pt prefers to wear oxygen instead, currently on 3lnc with spo2 98%.  RN aware.  Cpap machine remains in room on standby.

## 2019-09-30 NOTE — Progress Notes (Signed)
Inpatient Diabetes Program Recommendations  AACE/ADA: New Consensus Statement on Inpatient Glycemic Control   Target Ranges:  Prepandial:   less than 140 mg/dL      Peak postprandial:   less than 180 mg/dL (1-2 hours)      Critically ill patients:  140 - 180 mg/dL  Results for LOXLEY, SCHMALE (MRN [id number]) as of 09/30/2019 09:06  Ref. Range 09/29/2019 03:56 09/29/2019 08:16 09/29/2019 11:43 09/29/2019 15:34 09/29/2019 20:16 09/29/2019 23:47 09/30/2019 04:13 09/30/2019 07:46  Glucose-Capillary Latest Ref Range: 70 - 99 mg/dL 82 133 (H) 138 (H) 135 (H) 107 (H) 103 (H) 99 64 (L)    Review of Glycemic Control  Admit with: Severe DKA  History: Type 1 diabetes, CKD4  Home DM Meds: Insulin Pump (hasn't used in months since losing weight and reported that CBGs were normal off the pump) Current orders for Inpatient glycemic control: Levemir 15 units Q12H, Novolog 2-6 units Q4H  Inpatient Diabetes Program Recommendations:   Insulin-Basal: Please consider changing Levemir to 15 units QAM and Levemir 12 units QHS.  Insulin-Correction: Please discontinue current ICU glycemic order set and use regular glycemic control order set to order Novolog 0-6 units Q4H (if diet advanced and patient tolerating diet could change frequency to AC&HS).  Endocrinologist: Dr. [name] with [facility] seen 04/16/2019--Insulin Pump settings were adjusted to the following: Basal rates 12 am 1.00 units/hr  5:30 am 1.15 units/hr Total Basal= 26.775 units per 24 hour period Bolus settings I:C ratio 12 am 1:9.5 Sensitivity 40 Target 120-120  Thanks, Barnie Alderman, RN, MSN, CDE Diabetes Coordinator Inpatient Diabetes Program [phone] (Team Pager from 8am to 5pm)

## 2019-09-30 NOTE — Progress Notes (Signed)
Progress Note  Patient Name: [name] Date of Encounter: 09/30/2019  Primary Cardiologist: Donato Heinz, MD   Subjective   Feels a little nausea. No CP. No SOB. Sitting up in bed. Feels aggravated.   Inpatient Medications    Scheduled Meds:  amLODipine  10 mg Oral Daily   aspirin EC  81 mg Oral Daily   atorvastatin  80 mg Oral q1800   carvedilol  3.125 mg Oral BID WC   Chlorhexidine Gluconate Cloth  6 each Topical Daily   insulin aspart  2-6 Units Subcutaneous Q4H   insulin detemir  15 Units Subcutaneous Q12H   mouth rinse  15 mL Mouth Rinse BID   multivitamin with minerals  1 tablet Oral Daily   Continuous Infusions:  sodium chloride Stopped (09/29/19 1732)   heparin 1,300 Units/hr (09/29/19 1800)   niCARDipine 15 mg/hr (09/30/19 0545)   ondansetron (ZOFRAN) IV Stopped (09/29/19 2346)   PRN Meds: sodium chloride, acetaminophen, alum & mag hydroxide-simeth, dextrose, ondansetron (ZOFRAN) IV, phenol, promethazine   Vital Signs    Vitals:   09/30/19 0130 09/30/19 0325 09/30/19 0415 09/30/19 0530  BP: 131/66 (!) 146/78  (!) 163/72  Pulse: 85 84  85  Resp: 14 14  12   Temp:   (!) 97.4 F (36.3 C)   TempSrc:   Oral   SpO2: 95% 96%  97%  Weight:      Height:        Intake/Output Summary (Last 24 hours) at 09/30/2019 0819 Last data filed at 09/30/2019 0545 Gross per 24 hour  Intake 2511.26 ml  Output 850 ml  Net 1661.26 ml   Last 3 Weights 09/27/2019 09/21/2019 07/13/2019  Weight (lbs) 240 lb 4.8 oz 234 lb 255 lb 1.2 oz  Weight (kg) 109 kg 106.142 kg 115.7 kg      Telemetry    Sinus rhythm with no adverse arrhythmias, no changes- Personally Reviewed  ECG    Original EKG showed peaked T waves, subsequent no ischemic changes sinus rhythm- Personally Reviewed  Physical Exam   GEN: Well nourished, well developed, in no acute distress  HEENT: normal  Neck: no JVD, carotid bruits, or masses Cardiac: RRR; no murmurs, rubs,  or gallops,no edema  Respiratory:  clear to auscultation bilaterally, normal work of breathing GI: soft, nontender, nondistended, + BS MS: no deformity or atrophy  Skin: warm and dry, no rash Neuro:  Alert and Oriented x 3, Strength and sensation are intact Psych: euthymic mood, full affect   Labs    High Sensitivity Troponin:   Recent Labs  Lab 09/27/19 0842 09/27/19 1235 09/27/19 1619 09/27/19 1951 09/28/19 1310  TROPONINIHS 2,654* 7,715* 9,332* 10,640* 3,150*      Chemistry Recent Labs  Lab 09/28/19 1310 09/29/19 0218 09/30/19 0248  NA 139 137 135  K 4.6 4.2 4.1  CL 106 103 107  CO2 25 25 23   GLUCOSE 149* 115* 120*  BUN 55* 47* 37*  CREATININE 4.02* 3.51* 3.08*  CALCIUM 8.4* 8.3* 8.1*  GFRNONAA 17* 20* 23*  GFRAA 19* 23* 27*  ANIONGAP 8 9 5      Hematology Recent Labs  Lab 09/28/19 0241 09/29/19 0218 09/30/19 0248  WBC 15.2* 11.9* 8.8  RBC 4.31 4.22 3.95*  HGB 12.3* 12.3* 11.3*  HCT 38.4* 38.1* 35.5*  MCV 89.1 90.3 89.9  MCH 28.5 29.1 28.6  MCHC 32.0 32.3 31.8  RDW 13.8 14.2 13.7  PLT 258 235 203    BNPNo results for  input(s): BNP, PROBNP in the last 168 hours.   DDimer No results for input(s): DDIMER in the last 168 hours.   Radiology    US RENAL  Result Date: 09/28/2019 CLINICAL DATA:  48 year old male with acute renal insufficiency. History of diabetes and hypertension. EXAM: RENAL / URINARY TRACT ULTRASOUND COMPLETE COMPARISON:  Renal ultrasound dated 07/13/2017. FINDINGS: Right Kidney: Renal measurements: 11.7 x 6.0 x 6.7 cm = volume: 244. ML. There is increased renal parenchymal echogenicity. No hydronephrosis or shadowing stone. Left Kidney: Renal measurements: 11.1 x 6.2 x 5.8 cm = volume: 209 mL. There is increased renal parenchymal echogenicity. No hydronephrosis or shadowing stone. Bladder: The urinary bladder is distended otherwise unremarkable Other: None. IMPRESSION: Echogenic kidneys in keeping with chronic kidney disease. No  hydronephrosis or shadowing stone. Electronically Signed   By: Anner Crete M.D.   On: 09/28/2019 19:19    Cardiac Studies   Echo as above-hyperdynamic left ventricular function 70-75% EF with no wall motion abnormalities  Patient Profile     48 y.o. male with highly elevated high-sensitivity troponin, 16,000 in the setting of diabetic ketoacidosis, representative of myocardial injury in the setting of DKA.  Assessment & Plan    Myocardial injury in the setting of DKA -Thankfully, no wall motion abnormalities noted on echocardiogram.  Normal EF.  Hyperdynamic.  No evidence of effusion. -Stopping IV heparin today  -Elevated troponin does portend a worsened overall prognosis. -At some point as an outpatient, would provide him with stress test.  Kidney function precludes cardiac catheterization at this time. -Continue with aggressive secondary risk factor prevention-atorvastatin carvedilol aspirin 81  Abnormal EKG with peaked T waves -Potassium 7.2 on arrival with pH of 7.0.  Accelerated hypertension -Per primary team, on Cardene drip.  Currently stable with blood pressure of 557'D systolic. Hard to pull off Cardene.  --I increased coreg to 12.5mg  PO BID (give additional PO to 3.125 this AM) --Started hydralazine 25mg  Q8 --on amlodipine --no ACE/ARB with renal function.  AKI  - Creat from 4 to 3, improved.   DKA  - improved. Quite a bit of IV fluids have been administered.   Will sign off. Please let us know if we can be of further assistance. Will set up appt as outpatient with Dr. Gardiner Rhyme.       For questions or updates, please contact [place] Please consult [email or web address] for contact info under        Signed, Candee Furbish, MD  09/30/2019, 8:19 AM

## 2019-09-30 NOTE — Progress Notes (Signed)
PROGRESS NOTE  [name] VZD:[id number] DOB: [birth year] DOA: 09/27/2019 PCP: [name], MD  HPI/Recap of past 24 hours: HPI from [place] is a 48 y.o. male with h/o DM1 and CKD4 presenting with DKA after removing his insulin pump for the last few months. Blood sugar >1100 on arrival. Has history of DKA in the past. No infectious symptoms present on arrival. In the ED, pt was afebrile, in sinus tachycardia. Labs notable for initial blood glucose of 1104. Corrected sodium 144.7, K 7.2, CO2 5,  BUN 63, Creatinine 4.57 (from 3.02 most recently in October 2020). Beta-hydroxy >8.0. WBC 15.1, Hgb 11.8, plt 305. Initial EKG shows sinus tachycardia with peaked T waves. Initial VBG showed pH 7.036, pCO2 23. He was given 2.1 L NS bolus and started on insulin infusion while in the ED. He was also given sodium bicarb x2 and calcium chloride x1.  Patient admitted by [facility] for further management.  [facility] hospitalist assumed care on 09/29/2019.     Today, patient complains of nausea, denies any vomiting. Patient denies any chest pain, abdominal pain, diarrhea, fever/chills.  Noted to be frustrated about his medical condition.     Assessment/Plan: Active Problems:   DKA, type 1 ([place])  Diabetic ketoacidosis with a history of type I DM Likely 2/2 noncompliance (stopped using his insulin pump due to recent weight loss) A1c 10.2 S/p insulin drip Continue SSI, Accu-Chek, detemir, hypoglycemic protocol Diabetes coordinator on board Educated patient on the need to be compliant with his insulin regimen despite his weight loss due to type 1 diabetes  AKI on CKD stage IV Improving Likely due to above Baseline creatinine around 2.4 Daily BMP  Hypertension Had an episode of crisis overnight on 09/28/19 Still requiring nicardipine drip, plan to wean off Continue to hold home losartan due to AKI, continue increased dose of home amlodipine, coreg, cardiology started on  hydralazine  Elevated troponin Currently denies any chest pain Likely 2/2 demand ischemia Troponin trending downwards EKG with no acute ST changes Echo with EF of 70 to 75%, hyperdynamic function, with no RWMA Cardiology on board, s/p heparin X 48 hours, will need outpatient ischemic evaluation Continue Lipitor, aspirin, Coreg Continue telemetry monitoring  OSA Continue CPAP  Obesity Lifestyle modification advised       Malnutrition Type:      Malnutrition Characteristics:      Nutrition Interventions:       Estimated body mass index is 35.49 kg/m as calculated from the following:   Height as of this encounter: 5\' 9"  (1.753 m).   Weight as of this encounter: 109 kg.     Code Status: Full  Family Communication: None at bedside  Disposition Plan: Likely home   Consultants:  Cardiology  Procedures:  None  Antimicrobials:  None  DVT prophylaxis: Heparin [place]   Objective: Vitals:   09/30/19 1416 09/30/19 1500 09/30/19 1508 09/30/19 1528  BP: 131/71  107/80   Pulse:  80 82   Resp:  17 17   Temp:    97.8 F (36.6 C)  TempSrc:    Oral  SpO2:  99% 98%   Weight:      Height:        Intake/Output Summary (Last 24 hours) at 09/30/2019 1530 Last data filed at 09/30/2019 1300 Gross per 24 hour  Intake 2692.69 ml  Output 1525 ml  Net 1167.69 ml   Filed Weights   09/27/19 0600  Weight: 109 kg  Exam:  General: NAD   Cardiovascular: S1, S2 present  Respiratory: CTAB  Abdomen: Soft, nontender, nondistended, bowel sounds present  Musculoskeletal: No bilateral pedal edema noted  Skin: Normal  Psychiatry: Normal mood   Data Reviewed: CBC: Recent Labs  Lab 09/27/19 0200 09/27/19 0209 09/28/19 0241 09/29/19 0218 09/30/19 0248  WBC 15.1*  --  15.2* 11.9* 8.8  NEUTROABS 11.4*  --   --   --   --   HGB 11.8* 13.3 12.3* 12.3* 11.3*  HCT 41.9 39.0 38.4* 38.1* 35.5*  MCV 102.2*  --  89.1 90.3 89.9  PLT 305  --  258 235 947    Basic Metabolic Panel: Recent Labs  Lab 09/27/19 1619 09/27/19 1951 09/28/19 1310 09/29/19 0218 09/30/19 0248  NA 137 139 139 137 135  K 3.6 4.3 4.6 4.2 4.1  CL 104 106 106 103 107  CO2 23 22 25 25 23   GLUCOSE 290* 100* 149* 115* 120*  BUN 60* 57* 55* 47* 37*  CREATININE 4.30* 4.20* 4.02* 3.51* 3.08*  CALCIUM 8.3* 8.2* 8.4* 8.3* 8.1*   GFR: Estimated Creatinine Clearance: 36.1 mL/min (A) (by C-G formula based on SCr of 3.08 mg/dL (H)). Liver Function Tests: No results for input(s): AST, ALT, ALKPHOS, BILITOT, PROT, ALBUMIN in the last 168 hours. No results for input(s): LIPASE, AMYLASE in the last 168 hours. No results for input(s): AMMONIA in the last 168 hours. Coagulation Profile: No results for input(s): INR, PROTIME in the last 168 hours. Cardiac Enzymes: No results for input(s): CKTOTAL, CKMB, CKMBINDEX, TROPONINI in the last 168 hours. BNP (last 3 results) No results for input(s): PROBNP in the last 8760 hours. HbA1C: No results for input(s): HGBA1C in the last 72 hours. CBG: Recent Labs  Lab 09/30/19 0413 09/30/19 0746 09/30/19 0854 09/30/19 1203 09/30/19 1514  GLUCAP 99 64* 125* 113* 180*   Lipid Profile: No results for input(s): CHOL, HDL, LDLCALC, TRIG, CHOLHDL, LDLDIRECT in the last 72 hours. Thyroid Function Tests: No results for input(s): TSH, T4TOTAL, FREET4, T3FREE, THYROIDAB in the last 72 hours. Anemia Panel: No results for input(s): VITAMINB12, FOLATE, FERRITIN, TIBC, IRON, RETICCTPCT in the last 72 hours. Urine analysis:    Component Value Date/Time   COLORURINE YELLOW 09/28/2019 1812   APPEARANCEUR CLEAR 09/28/2019 1812   LABSPEC 1.013 09/28/2019 1812   PHURINE 5.0 09/28/2019 1812   GLUCOSEU 150 (A) 09/28/2019 1812   HGBUR MODERATE (A) 09/28/2019 1812   BILIRUBINUR NEGATIVE 09/28/2019 1812   KETONESUR 5 (A) 09/28/2019 1812   PROTEINUR >=300 (A) 09/28/2019 1812   UROBILINOGEN 0.2 06/07/2014 0151   NITRITE NEGATIVE 09/28/2019 1812    LEUKOCYTESUR NEGATIVE 09/28/2019 1812   Sepsis Labs: @LABRCNTIP (procalcitonin:4,lacticidven:4)  ) Recent Results (from the past 240 hour(s))  Respiratory Panel by RT PCR (Flu A&B, Covid) - Nasopharyngeal Swab     Status: None   Collection Time: 09/27/19  2:34 AM   Specimen: Nasopharyngeal Swab  Result Value Ref Range Status   SARS Coronavirus 2 by RT PCR NEGATIVE NEGATIVE Final    Comment: (NOTE) SARS-CoV-2 target nucleic acids are NOT DETECTED. The SARS-CoV-2 RNA is generally detectable in upper respiratoy specimens during the acute phase of infection. The lowest concentration of SARS-CoV-2 viral copies this assay can detect is 131 copies/mL. A negative result does not preclude SARS-Cov-2 infection and should not be used as the sole basis for treatment or other patient management decisions. A negative result may occur with  improper specimen collection/handling, submission of specimen other than  nasopharyngeal swab, presence of viral mutation(s) within the areas targeted by this assay, and inadequate number of viral copies (<131 copies/mL). A negative result must be combined with clinical observations, patient history, and epidemiological information. The expected result is Negative. Fact Sheet for Patients:  PinkCheek.be Fact Sheet for Healthcare Providers:  GravelBags.it This test is not yet ap proved or cleared by the [place] FDA and  has been authorized for detection and/or diagnosis of SARS-CoV-2 by FDA under an Emergency Use Authorization (EUA). This EUA will remain  in effect (meaning this test can be used) for the duration of the COVID-19 declaration under Section 564(b)(1) of the Act, 21 U.S.C. section 360bbb-3(b)(1), unless the authorization is terminated or revoked sooner.    Influenza A by PCR NEGATIVE NEGATIVE Final   Influenza B by PCR NEGATIVE NEGATIVE Final    Comment: (NOTE) The Xpert Xpress  SARS-CoV-2/FLU/RSV assay is intended as an aid in  the diagnosis of influenza from Nasopharyngeal swab specimens and  should not be used as a sole basis for treatment. Nasal washings and  aspirates are unacceptable for Xpert Xpress SARS-CoV-2/FLU/RSV  testing. Fact Sheet for Patients: PinkCheek.be Fact Sheet for Healthcare Providers: GravelBags.it This test is not yet approved or cleared by the [place] FDA and  has been authorized for detection and/or diagnosis of SARS-CoV-2 by  FDA under an Emergency Use Authorization (EUA). This EUA will remain  in effect (meaning this test can be used) for the duration of the  Covid-19 declaration under Section 564(b)(1) of the Act, 21  U.S.C. section 360bbb-3(b)(1), unless the authorization is  terminated or revoked. Performed at [facility], [place] [address] [place], [place] [address]   MRSA PCR Screening     Status: None   Collection Time: 09/27/19  5:35 AM   Specimen: Nasal Mucosa; Nasopharyngeal  Result Value Ref Range Status   MRSA by PCR NEGATIVE NEGATIVE Final    Comment:        The GeneXpert MRSA Assay (FDA approved for NASAL specimens only), is one component of a comprehensive MRSA colonization surveillance program. It is not intended to diagnose MRSA infection nor to guide or monitor treatment for MRSA infections. Performed at [facility], [place] [address] [place], [place] [address]   Culture, blood (routine x 2)     Status: None (Preliminary result)   Collection Time: 09/27/19  8:42 AM   Specimen: BLOOD RIGHT HAND  Result Value Ref Range Status   Specimen Description   Final    BLOOD RIGHT HAND Performed at [place] [address], [place], [place] [address]    Special Requests   Final    BOTTLES DRAWN AEROBIC ONLY Blood Culture results may not be optimal due to an inadequate volume of blood received  in culture bottles Performed at [place] [address] [place], [place] [address]    Culture   Final    NO GROWTH 3 DAYS Performed at [facility], [place] [address], [place],  [address]    Report Status PENDING  Incomplete  Culture, blood (routine x 2)     Status: None (Preliminary result)   Collection Time: 09/27/19  8:49 AM   Specimen: BLOOD LEFT HAND  Result Value Ref Range Status   Specimen Description   Final    BLOOD LEFT HAND Performed at [place] [address], [place],  [address]    Special Requests  Final    BOTTLES DRAWN AEROBIC ONLY Blood Culture results may not be optimal due to an inadequate volume of blood received in culture bottles Performed at [facility], [place] [address], [place], [place] [address]    Culture   Final    NO GROWTH 3 DAYS Performed at [facility], [place] [address] [place], [place] [address]    Report Status PENDING  Incomplete      Studies: No results found.  Scheduled Meds: . amLODipine  10 mg Oral Daily  . aspirin EC  81 mg Oral Daily  . atorvastatin  80 mg Oral q1800  . carvedilol  12.5 mg Oral BID WC  . Chlorhexidine Gluconate Cloth  6 each Topical Daily  . hydrALAZINE  25 mg Oral Q8H  . insulin aspart  0-6 Units Subcutaneous Q4H  . insulin detemir  12 Units Subcutaneous QHS  . [START ON 10/01/2019] insulin detemir  15 Units Subcutaneous q morning - 10a  . mouth rinse  15 mL Mouth Rinse BID  . multivitamin with minerals  1 tablet Oral Daily    Continuous Infusions: . sodium chloride Stopped (09/29/19 1732)  . niCARDipine Stopped (09/30/19 1147)  . ondansetron (ZOFRAN) IV Stopped (09/29/19 2346)     LOS: 3 days     Alma Friendly, MD [facility]  If 7PM-7AM, please contact night-coverage [email or web address] 09/30/2019, 3:30 PM

## 2019-10-01 LAB — GLUCOSE, CAPILLARY
Glucose-Capillary: 257 mg/dL — ABNORMAL HIGH (ref 70–99)
Glucose-Capillary: 197 mg/dL — ABNORMAL HIGH (ref 70–99)
Glucose-Capillary: 195 mg/dL — ABNORMAL HIGH (ref 70–99)
Glucose-Capillary: 240 mg/dL — ABNORMAL HIGH (ref 70–99)
Glucose-Capillary: 231 mg/dL — ABNORMAL HIGH (ref 70–99)

## 2019-10-01 LAB — BASIC METABOLIC PANEL
Anion gap: 8 (ref 5–15)
BUN: 35 mg/dL — ABNORMAL HIGH (ref 6–20)
CO2: 23 mmol/L (ref 22–32)
Calcium: 8.3 mg/dL — ABNORMAL LOW (ref 8.9–10.3)
Chloride: 103 mmol/L (ref 98–111)
Creatinine, Ser: 2.96 mg/dL — ABNORMAL HIGH (ref 0.61–1.24)
GFR calc Af Amer: 28 mL/min — ABNORMAL LOW (ref >60)
GFR calc non Af Amer: 24 mL/min — ABNORMAL LOW (ref >60)
Glucose, Bld: 293 mg/dL — ABNORMAL HIGH (ref 70–99)
Potassium: 4.3 mmol/L (ref 3.5–5.1)
Sodium: 134 mmol/L — ABNORMAL LOW (ref 135–145)

## 2019-10-01 LAB — CBC
HCT: 37.7 % — ABNORMAL LOW (ref 39.0–52.0)
Hemoglobin: 12 g/dL — ABNORMAL LOW (ref 13.0–17.0)
MCH: 29 pg (ref 26.0–34.0)
MCHC: 31.8 g/dL (ref 30.0–36.0)
MCV: 91.1 fL (ref 80.0–100.0)
Platelets: 221 10*3/uL (ref 150–400)
RBC: 4.14 MIL/uL — ABNORMAL LOW (ref 4.22–5.81)
RDW: 13.6 % (ref 11.5–15.5)
WBC: 7.6 10*3/uL (ref 4.0–10.5)
nRBC: 0 % (ref 0.0–0.2)

## 2019-10-01 MED ORDER — FAMOTIDINE 20 MG PO TABS
20.0000 mg | ORAL_TABLET | Freq: Every day | ORAL | Status: DC
  Administered 2019-10-01 – 2019-10-02 (×2): 20 mg via ORAL
  Filled 2019-10-01 (×2): qty 1

## 2019-10-01 MED ORDER — PANTOPRAZOLE SODIUM 40 MG PO TBEC
40.0000 mg | DELAYED_RELEASE_TABLET | Freq: Every day | ORAL | Status: DC
  Administered 2019-10-01 – 2019-10-02 (×2): 40 mg via ORAL
  Filled 2019-10-01 (×2): qty 1

## 2019-10-01 MED ORDER — HYDRALAZINE HCL 50 MG PO TABS
50.0000 mg | ORAL_TABLET | Freq: Three times a day (TID) | ORAL | Status: DC
  Administered 2019-10-01 – 2019-10-03 (×6): 50 mg via ORAL
  Filled 2019-10-01 (×7): qty 1

## 2019-10-01 MED ORDER — LIDOCAINE VISCOUS HCL 2 % MT SOLN
15.0000 mL | Freq: Four times a day (QID) | OROMUCOSAL | Status: DC | PRN
  Administered 2019-10-01: 15 mL via ORAL
  Filled 2019-10-01 (×2): qty 15

## 2019-10-01 MED ORDER — FAMOTIDINE IN NACL 20-0.9 MG/50ML-% IV SOLN
20.0000 mg | Freq: Once | INTRAVENOUS | Status: AC
  Administered 2019-10-01: 20 mg via INTRAVENOUS
  Filled 2019-10-01: qty 50

## 2019-10-01 MED ORDER — ALUM & MAG HYDROXIDE-SIMETH 200-200-20 MG/5ML PO SUSP
30.0000 mL | Freq: Once | ORAL | Status: AC
  Administered 2019-10-01: 30 mL via ORAL
  Filled 2019-10-01: qty 30

## 2019-10-01 NOTE — Progress Notes (Signed)
Pharmacy Renal Dose Adjustment:  Famotidine 20 mg PO BID changed to 20 mg PO daily for CrCl of 37 mL/min.  Lenis Noon, PharmD 10/01/19 6:35 PM

## 2019-10-01 NOTE — Progress Notes (Signed)
PROGRESS NOTE  [name] DJM:[id number] DOB: May 30, 1971 DOA: 09/27/2019 PCP: [name], MD  HPI/Recap of past 24 hours: HPI from [place] is a 48 y.o. male with h/o DM1 and CKD4 presenting with DKA after removing his insulin pump for the last few months. Blood sugar >1100 on arrival. Has history of DKA in the past. No infectious symptoms present on arrival. In the ED, pt was afebrile, in sinus tachycardia. Labs notable for initial blood glucose of 1104. Corrected sodium 144.7, K 7.2, CO2 5,  BUN 63, Creatinine 4.57 (from 3.02 most recently in October 2020). Beta-hydroxy >8.0. WBC 15.1, Hgb 11.8, plt 305. Initial EKG shows sinus tachycardia with peaked T waves. Initial VBG showed pH 7.036, pCO2 23. He was given 2.1 L NS bolus and started on insulin infusion while in the ED. He was also given sodium bicarb x2 and calcium chloride x1.  Patient admitted by [facility] for further management.  [facility] hospitalist assumed care on 09/29/2019.     Today, patient complains of heartburn, poor appetite, nausea.  He has not had a significant bowel movement in several days, but did report a small amount of loose stool overnight.  Complains of swelling in his hands and arms bilaterally.  Feels this is related to IV hydration.     Assessment/Plan: Active Problems:   DKA, type 1 ([place])  Diabetic ketoacidosis with a history of type I DM Likely 2/2 noncompliance (stopped using his insulin pump due to recent weight loss) A1c 10.2 S/p insulin drip Continue SSI, Accu-Chek, detemir, hypoglycemic protocol Diabetes coordinator on board Educated patient on the need to be compliant with his insulin regimen despite his weight loss due to type 1 diabetes  AKI on CKD stage IV Improving Likely due to above Baseline creatinine around 2.4 Daily BMP  Hypertension Had an episode of crisis overnight on 09/28/19 He required nicardipine drip which has since been weaned off Continue to hold  home losartan due to AKI, continue increased dose of home amlodipine, coreg, cardiology started on hydralazine which will be increased  Elevated troponin Currently denies any chest pain Likely 2/2 demand ischemia Troponin trending downwards EKG with no acute ST changes Echo with EF of 70 to 75%, hyperdynamic function, with no RWMA Cardiology on board, s/p heparin X 48 hours, will need outpatient ischemic evaluation Continue Lipitor, aspirin, Coreg Continue telemetry monitoring  OSA Continue CPAP  Obesity Lifestyle modification advised       Malnutrition Type:      Malnutrition Characteristics:      Nutrition Interventions:       Estimated body mass index is 35.49 kg/m as calculated from the following:   Height as of this encounter: 5\' 9"  (1.753 m).   Weight as of this encounter: 109 kg.     Code Status: Full  Family Communication: None at bedside  Disposition Plan: Discharge home likely in the next 24 hours if blood pressure and blood sugars remain stable   Consultants:  Cardiology  Procedures:  None  Antimicrobials:  None  DVT prophylaxis: Heparin [place]   Objective: Vitals:   10/01/19 1200 10/01/19 1400 10/01/19 1511 10/01/19 1950  BP: (!) 157/81 (!) 163/79 (!) 156/77 (!) 154/72  Pulse: 78 83 83 86  Resp: 18 (!) 9 18 16   Temp:   98.4 F (36.9 C) 98.6 F (37 C)  TempSrc:   Oral Oral  SpO2: 97% 94% 96% 94%  Weight:      Height:  Intake/Output Summary (Last 24 hours) at 10/01/2019 2144 Last data filed at 10/01/2019 1400 Gross per 24 hour  Intake 612.99 ml  Output 2 ml  Net 610.99 ml   Filed Weights   09/27/19 0600  Weight: 109 kg    Exam: General exam: Alert, awake, oriented x 3 Respiratory system: Clear to auscultation. Respiratory effort normal. Cardiovascular system:RRR. No murmurs, rubs, gallops. Gastrointestinal system: Abdomen is nondistended, soft and nontender. No organomegaly or masses felt. Normal bowel sounds  heard. Central nervous system: Alert and oriented. No focal neurological deficits. Extremities: Swelling in hands bilaterally Skin: No rashes, lesions or ulcers  Psychiatry: Judgement and insight appear normal. Mood & affect appropriate.      Data Reviewed: CBC: Recent Labs  Lab 09/27/19 0200 09/27/19 0209 09/28/19 0241 09/29/19 0218 09/30/19 0248 10/01/19 0252  WBC 15.1*  --  15.2* 11.9* 8.8 7.6  NEUTROABS 11.4*  --   --   --   --   --   HGB 11.8* 13.3 12.3* 12.3* 11.3* 12.0*  HCT 41.9 39.0 38.4* 38.1* 35.5* 37.7*  MCV 102.2*  --  89.1 90.3 89.9 91.1  PLT 305  --  258 235 203 027   Basic Metabolic Panel: Recent Labs  Lab 09/27/19 1951 09/28/19 1310 09/29/19 0218 09/30/19 0248 10/01/19 0252  NA 139 139 137 135 134*  K 4.3 4.6 4.2 4.1 4.3  CL 106 106 103 107 103  CO2 22 25 25 23 23   GLUCOSE 100* 149* 115* 120* 293*  BUN 57* 55* 47* 37* 35*  CREATININE 4.20* 4.02* 3.51* 3.08* 2.96*  CALCIUM 8.2* 8.4* 8.3* 8.1* 8.3*   GFR: Estimated Creatinine Clearance: 37.5 mL/min (A) (by C-G formula based on SCr of 2.96 mg/dL (H)). Liver Function Tests: No results for input(s): AST, ALT, ALKPHOS, BILITOT, PROT, ALBUMIN in the last 168 hours. No results for input(s): LIPASE, AMYLASE in the last 168 hours. No results for input(s): AMMONIA in the last 168 hours. Coagulation Profile: No results for input(s): INR, PROTIME in the last 168 hours. Cardiac Enzymes: No results for input(s): CKTOTAL, CKMB, CKMBINDEX, TROPONINI in the last 168 hours. BNP (last 3 results) No results for input(s): PROBNP in the last 8760 hours. HbA1C: No results for input(s): HGBA1C in the last 72 hours. CBG: Recent Labs  Lab 10/01/19 0311 10/01/19 0828 10/01/19 1202 10/01/19 1559 10/01/19 1951  GLUCAP 257* 197* 195* 240* 231*   Lipid Profile: No results for input(s): CHOL, HDL, LDLCALC, TRIG, CHOLHDL, LDLDIRECT in the last 72 hours. Thyroid Function Tests: No results for input(s): TSH,  T4TOTAL, FREET4, T3FREE, THYROIDAB in the last 72 hours. Anemia Panel: No results for input(s): VITAMINB12, FOLATE, FERRITIN, TIBC, IRON, RETICCTPCT in the last 72 hours. Urine analysis:    Component Value Date/Time   COLORURINE YELLOW 09/28/2019 1812   APPEARANCEUR CLEAR 09/28/2019 1812   LABSPEC 1.013 09/28/2019 1812   PHURINE 5.0 09/28/2019 1812   GLUCOSEU 150 (A) 09/28/2019 1812   HGBUR MODERATE (A) 09/28/2019 1812   BILIRUBINUR NEGATIVE 09/28/2019 1812   KETONESUR 5 (A) 09/28/2019 1812   PROTEINUR >=300 (A) 09/28/2019 1812   UROBILINOGEN 0.2 06/07/2014 0151   NITRITE NEGATIVE 09/28/2019 1812   LEUKOCYTESUR NEGATIVE 09/28/2019 1812   Sepsis Labs: @LABRCNTIP (procalcitonin:4,lacticidven:4)  ) Recent Results (from the past 240 hour(s))  Respiratory Panel by RT PCR (Flu A&B, Covid) - Nasopharyngeal Swab     Status: None   Collection Time: 09/27/19  2:34 AM   Specimen: Nasopharyngeal Swab  Result Value Ref  Range Status   SARS Coronavirus 2 by RT PCR NEGATIVE NEGATIVE Final    Comment: (NOTE) SARS-CoV-2 target nucleic acids are NOT DETECTED. The SARS-CoV-2 RNA is generally detectable in upper respiratoy specimens during the acute phase of infection. The lowest concentration of SARS-CoV-2 viral copies this assay can detect is 131 copies/mL. A negative result does not preclude SARS-Cov-2 infection and should not be used as the sole basis for treatment or other patient management decisions. A negative result may occur with  improper specimen collection/handling, submission of specimen other than nasopharyngeal swab, presence of viral mutation(s) within the areas targeted by this assay, and inadequate number of viral copies (<131 copies/mL). A negative result must be combined with clinical observations, patient history, and epidemiological information. The expected result is Negative. Fact Sheet for Patients:  PinkCheek.be Fact Sheet for Healthcare  Providers:  GravelBags.it This test is not yet ap proved or cleared by the [place] FDA and  has been authorized for detection and/or diagnosis of SARS-CoV-2 by FDA under an Emergency Use Authorization (EUA). This EUA will remain  in effect (meaning this test can be used) for the duration of the COVID-19 declaration under Section 564(b)(1) of the Act, 21 U.S.C. section 360bbb-3(b)(1), unless the authorization is terminated or revoked sooner.    Influenza A by PCR NEGATIVE NEGATIVE Final   Influenza B by PCR NEGATIVE NEGATIVE Final    Comment: (NOTE) The Xpert Xpress SARS-CoV-2/FLU/RSV assay is intended as an aid in  the diagnosis of influenza from Nasopharyngeal swab specimens and  should not be used as a sole basis for treatment. Nasal washings and  aspirates are unacceptable for Xpert Xpress SARS-CoV-2/FLU/RSV  testing. Fact Sheet for Patients: PinkCheek.be Fact Sheet for Healthcare Providers: GravelBags.it This test is not yet approved or cleared by the [place] FDA and  has been authorized for detection and/or diagnosis of SARS-CoV-2 by  FDA under an Emergency Use Authorization (EUA). This EUA will remain  in effect (meaning this test can be used) for the duration of the  Covid-19 declaration under Section 564(b)(1) of the Act, 21  U.S.C. section 360bbb-3(b)(1), unless the authorization is  terminated or revoked. Performed at [facility], [place] [address] [place], [place] [address]   MRSA PCR Screening     Status: None   Collection Time: 09/27/19  5:35 AM   Specimen: Nasal Mucosa; Nasopharyngeal  Result Value Ref Range Status   MRSA by PCR NEGATIVE NEGATIVE Final    Comment:        The GeneXpert MRSA Assay (FDA approved for NASAL specimens only), is one component of a comprehensive MRSA colonization surveillance program. It is not intended to diagnose  MRSA infection nor to guide or monitor treatment for MRSA infections. Performed at [facility], [place] [address], [place], [place] [address]   Culture, blood (routine x 2)     Status: None (Preliminary result)   Collection Time: 09/27/19  8:42 AM   Specimen: BLOOD RIGHT HAND  Result Value Ref Range Status   Specimen Description   Final    BLOOD RIGHT HAND Performed at [place] [address], [place], [place] [address]    Special Requests   Final    BOTTLES DRAWN AEROBIC ONLY Blood Culture results may not be optimal due to an inadequate volume of blood received in culture bottles Performed at [place] [address] [place], [place] [address]    Culture   Final  NO GROWTH 4 DAYS Performed at [facility], [place] [address], [place], [place] [address]    Report Status PENDING  Incomplete  Culture, blood (routine x 2)     Status: None (Preliminary result)   Collection Time: 09/27/19  8:49 AM   Specimen: BLOOD LEFT HAND  Result Value Ref Range Status   Specimen Description   Final    BLOOD LEFT HAND Performed at [place] [address] [place], [place] [address]    Special Requests   Final    BOTTLES DRAWN AEROBIC ONLY Blood Culture results may not be optimal due to an inadequate volume of blood received in culture bottles Performed at [place] [address], [place], [place] [address]    Culture   Final    NO GROWTH 4 DAYS Performed at [facility], [place] [address] [place],  [address]    Report Status PENDING  Incomplete      Studies: No results found.  Scheduled Meds: . amLODipine  10 mg Oral Daily  . aspirin EC  81 mg Oral Daily  . atorvastatin  80 mg Oral q1800  . carvedilol  12.5 mg Oral BID WC  . Chlorhexidine Gluconate Cloth  6 each Topical Daily  . famotidine  20 mg Oral QHS  . heparin injection (subcutaneous)  5,000 Units  Subcutaneous Q8H  . hydrALAZINE  50 mg Oral Q8H  . insulin aspart  0-6 Units Subcutaneous Q4H  . insulin detemir  12 Units Subcutaneous QHS  . insulin detemir  15 Units Subcutaneous q morning - 10a  . mouth rinse  15 mL Mouth Rinse BID  . multivitamin with minerals  1 tablet Oral Daily  . pantoprazole  40 mg Oral Daily    Continuous Infusions: . sodium chloride Stopped (10/01/19 1102)  . niCARDipine Stopped (09/30/19 1147)  . ondansetron (ZOFRAN) IV Stopped (09/30/19 2346)     LOS: 4 days     Kathie Dike, MD [facility]  If 7PM-7AM, please contact night-coverage [email or web address] 10/01/2019, 9:44 PM

## 2019-10-01 NOTE — Progress Notes (Signed)
Pt complaining of heartburn stating that "it feels like it is coming up my throat". Maalox given which was ineffective. [facility] on-call then ordered Pepcid IV which was very effective. RN will continue to monitor the need for medication.

## 2019-10-01 NOTE — Progress Notes (Signed)
Patient continues to decline nocturnal CPAP. Equipment is not longer at the bedside. Order changed to prn.

## 2019-10-02 DIAGNOSIS — R1310 Dysphagia, unspecified: Secondary | ICD-10-CM

## 2019-10-02 DIAGNOSIS — R112 Nausea with vomiting, unspecified: Secondary | ICD-10-CM

## 2019-10-02 DIAGNOSIS — K5900 Constipation, unspecified: Secondary | ICD-10-CM

## 2019-10-02 LAB — CULTURE, BLOOD (ROUTINE X 2)
Culture: NO GROWTH
Culture: NO GROWTH

## 2019-10-02 LAB — BASIC METABOLIC PANEL
Anion gap: 3 — ABNORMAL LOW (ref 5–15)
BUN: 25 mg/dL — ABNORMAL HIGH (ref 6–20)
CO2: 27 mmol/L (ref 22–32)
Calcium: 8.4 mg/dL — ABNORMAL LOW (ref 8.9–10.3)
Chloride: 106 mmol/L (ref 98–111)
Creatinine, Ser: 2.89 mg/dL — ABNORMAL HIGH (ref 0.61–1.24)
GFR calc Af Amer: 29 mL/min — ABNORMAL LOW (ref >60)
GFR calc non Af Amer: 25 mL/min — ABNORMAL LOW (ref >60)
Glucose, Bld: 76 mg/dL (ref 70–99)
Potassium: 4.2 mmol/L (ref 3.5–5.1)
Sodium: 136 mmol/L (ref 135–145)

## 2019-10-02 LAB — GLUCOSE, CAPILLARY
Glucose-Capillary: 179 mg/dL — ABNORMAL HIGH (ref 70–99)
Glucose-Capillary: 183 mg/dL — ABNORMAL HIGH (ref 70–99)
Glucose-Capillary: 187 mg/dL — ABNORMAL HIGH (ref 70–99)
Glucose-Capillary: 196 mg/dL — ABNORMAL HIGH (ref 70–99)
Glucose-Capillary: 200 mg/dL — ABNORMAL HIGH (ref 70–99)
Glucose-Capillary: 91 mg/dL (ref 70–99)
Glucose-Capillary: 98 mg/dL (ref 70–99)

## 2019-10-02 MED ORDER — ZOLPIDEM TARTRATE 5 MG PO TABS
5.0000 mg | ORAL_TABLET | Freq: Once | ORAL | Status: AC
  Administered 2019-10-02: 5 mg via ORAL
  Filled 2019-10-02: qty 1

## 2019-10-02 MED ORDER — SALINE SPRAY 0.65 % NA SOLN
1.0000 | NASAL | Status: DC | PRN
  Filled 2019-10-02: qty 44

## 2019-10-02 NOTE — H&P (View-Only) (Signed)
Referring Provider: Dr. Karie Kirks Primary Care Physician:  [name], MD Primary Gastroenterologist:  unassigned  Reason for Consultation:  Dysphagia   HPI: [name] is a 48 y.o. male with a past medical history of hypertension, hypercholesterolemia, hypertension, sinus tachycardia, TIA early 2020, diabetes mellitus type 1 diagnosed at the age of 59 on an insulin pump, DKA, diabetic retinopathy, gastroparesis and chronic kidney disease stage IV. Past cervical neck fusion 2009. Right and  inguinal hernia repair. He strained his back the 4th week of December. He took Oxycodone 1 tab which resulted in feeling sick to his stomach, primarily with nausea. He took a 2nd Oxycodone tab later that evening to help him sleep with continued lower back pain. He awakened in the middle of the night with nausea and vomiting. He stated vomiting clear water to yellow bilious emesis for several hours nonstop with constant dry heaves. No hematemesis. He became profoundly fatigued. He also reported having polydipsia and polyuria for a few days prior to the onset of his nausea and vomiting. His wife called 109 and he was transported to [facility] ED 09/27/2019 for further evaluation.  He denied being off of his insulin pump as previous histories reported.  He stated he took off his insulin pump prior to presenting to the emergency room.  He intentionally lost 34 pounds by diet changes over the past 2 months and his insulin basal rate has been decreased and he is requiring less insulin boluses.  In the ED he demonstrated Kussmaul breathing and he was tachycardic.  His admission glucose level was 1104.  K+ 7.2.  BUN 63.  Creatinine 4.57.  VBG showed metabolic acidosis.  He received normal saline IV bolus, sodium bicarb, calcium chloride and an insulin infusion was initiated in the ED.  He was admitted to the ICU for an insulin drip, IV hydration and electrolyte/glucose monitoring. His troponin levels were elevated.    12 lead EKG showed peaked T waves which improved after his hyperkalemia resolved.  He was seen by cardiologist Dr. Oswaldo Milian 12/25.  TTE showed hyperdynamic systolic function with a EF of 70 to 75% and normal RV function.  Acute coronary syndrome was not suspected, however, further cardiac evaluation to rule out underlying CAD was recommended after he recovers from his acute illness.  GI requested due to new onset of dysphagia.  His glucose levels improved and his electrolyte derangement resolved.  He was transferred out of the ICU to the fifth floor on 10/01/2019. He informed  Dr. [name] today that he was experiencing dysphagia symptoms and a GI consult was requested for further evaluation.   He describes a feeling as if food and liquid is slower to pass down his throat to the esophagus.  He does not assess food or liquid actually gets stuck.  His difficulty swallowing started 2 to 3 days ago and is occurring several times daily.  He denies gagging or expelling any food or liquid during these episodes.  He has infrequent heartburn.  He rarely takes Tums at home.  He reported having a remote history of reflux 5 to 10 years ago, he had an EGD at that time which he stated showed reflux.  He denies taking any aspirin or NSAIDs.  He has intermittent nausea throughout the day without further vomiting.  He denies having any upper or lower abdominal pain.  He typically passes a normal formed brown bowel movement once daily without melena or rectal bleeding.  However, since being  in the hospital he has been somewhat constipated but his p.o. intake has been decreased.  He reported passing a loose brown stool 3 days ago.  He underwent a colonoscopy in his early 66s due to having abdominal pain, he reported the colonoscopy was normal.  No family history of esophageal, gastric or colorectal cancer.  In review of his history, he reported having a possible TIA diagnosed early 2020.  He works at [employer] and he was slightly confused at work so he was sent to the emergency room for further evaluation.  He stated he was diagnosed with a possible TIA.  He denies having any further neurological evaluation or follow-up.  No further episodes of confusion.   Labs 10/02/2019: Sodium 136.  Potassium 4.2.  Glucose 76.  BUN 25.  Creatinine 2.89. Labs 10/02/2019: Sodium 134.  Potassium 4.3.  Glucose 293.  BUN 35.  Creatinine 2.96.  Anion gap 8.                                        WBC 7.6.  Hemoglobin 12.0.  Hematocrit 37.7.  MCV 91.1.  Platelet 221.   Past Medical History:  Diagnosis Date  . CKD (chronic kidney disease), stage II   . Gastroparesis   . Herniated cervical disc   . HTN (hypertension)   . Hypercholesteremia   . S/P cardiac cath    a. 10-15 yrs ago at [facility] due to tachycardia, reportedly normal. b. Normal ETT 03/2012.  Marland Kitchen Sinus tachycardia    a. 24-hr Holter 03/2012 - SR, occ PVCs, no VT, avg HR 96bpm.  . TIA (transient ischemic attack)   . Type 1 diabetes mellitus ([place])    a. With insulin pump.    Past Surgical History:  Procedure Laterality Date  . cervical neck fusion    . HERNIA REPAIR     bilateral  . LEFT HEART CATHETERIZATION WITH CORONARY ANGIOGRAM N/A 12/18/2013   Procedure: LEFT HEART CATHETERIZATION WITH CORONARY ANGIOGRAM;  Surgeon: [name] M Martinique, MD;  Location: [facility] CATH LAB;  Service: Cardiovascular;  Laterality: N/A;    Prior to Admission medications   Medication Sig Start Date End Date Taking? Authorizing Provider  atorvastatin (LIPITOR) 80 MG tablet Take 80 mg by mouth daily.   Yes [provider]  carvedilol (COREG) 3.125 MG tablet Take 3.125 mg by mouth 2 (two) times daily.   Yes [provider]  HUMALOG 100 UNIT/ML injection Inject 100 Units into the skin daily. 11/11/16  Yes [provider]  HYDROcodone-acetaminophen (NORCO/VICODIN) 5-325 MG tablet Take 1 tablet by mouth every 6 (six) hours as needed for moderate pain. 09/21/19   Yes Fisher, Linden Dolin, PA-C  losartan (COZAAR) 100 MG tablet Take 100 mg by mouth daily.   Yes [provider]  amLODipine (NORVASC) 5 MG tablet Take 1 tablet (5 mg total) by mouth daily. Patient not taking: Reported on 09/27/2019 03/31/18   Wellington Hampshire, MD  aspirin EC 81 MG tablet Take 1 tablet (81 mg total) by mouth daily. Patient not taking: Reported on 09/27/2019 08/29/18   Salary, Avel Peace, MD  BAYER CONTOUR NEXT TEST test strip Inject 1 strip into the skin 4 (four) times daily as needed. 12/23/16   [provider]  budesonide-formoterol (SYMBICORT) 160-4.5 MCG/ACT inhaler Inhale 2 puffs into the lungs 2 (two) times daily. Patient not taking: Reported on 09/27/2019  03/21/18   Flora Lipps, MD  colchicine 0.6 MG tablet Take 1 tablet (0.6 mg total) by mouth daily. Take 2 tablets stat, then 1 tablet daily until finished. Patient not taking: Reported on 09/27/2019 04/17/19   Edrick Kins, DPM  erythromycin ophthalmic ointment Place 1 application into both eyes 3 (three) times daily. About 1/4 inch Patient not taking: Reported on 09/27/2019 09/21/19   Versie Starks, PA-C  gentamicin cream (GARAMYCIN) 0.1 % Apply 1 application topically 2 (two) times daily. Patient not taking: Reported on 09/27/2019 06/27/18   Edrick Kins, DPM  pantoprazole (PROTONIX) 40 MG tablet Take 1 tablet (40 mg total) by mouth daily. 03/21/18 03/21/19  Flora Lipps, MD  Papaverine-Phentolamine 30-1 MG/ML SOLN 0.3 mLs by Intracavernosal route as directed. Patient not taking: Reported on 09/27/2019 04/18/18   Abbie Sons, MD    Current Facility-Administered Medications  Medication Dose Route Frequency Provider Last Rate Last Admin  . 0.9 %  sodium chloride infusion   Intravenous PRN Alma Friendly, MD   Stopped at 10/01/19 1102  . acetaminophen (TYLENOL) tablet 650 mg  650 mg Oral Q6H PRN Bennie Pierini, MD   650 mg at 10/02/19 [phone]  . alum & mag hydroxide-simeth (MAALOX/MYLANTA)  200-200-20 MG/5ML suspension 30 mL  30 mL Oral Q4H PRN Alma Friendly, MD   30 mL at 10/01/19 2040  . amLODipine (NORVASC) tablet 10 mg  10 mg Oral Daily Alma Friendly, MD   10 mg at 10/02/19 1050  . aspirin EC tablet 81 mg  81 mg Oral Daily Donato Heinz, MD   81 mg at 10/02/19 1050  . atorvastatin (LIPITOR) tablet 80 mg  80 mg Oral q1800 Bennie Pierini, MD   80 mg at 10/01/19 1700  . carvedilol (COREG) tablet 12.5 mg  12.5 mg Oral BID WC Jerline Pain, MD   12.5 mg at 10/02/19 0859  . Chlorhexidine Gluconate Cloth 2 % PADS 6 each  6 each Topical Daily Bennie Pierini, MD   6 each at 10/01/19 1219  . dextrose 50 % solution 0-50 mL  0-50 mL Intravenous PRN Molpus, John, MD      . famotidine (PEPCID) tablet 20 mg  20 mg Oral QHS Kathie Dike, MD   20 mg at 10/01/19 2236  . heparin injection 5,000 Units  5,000 Units Subcutaneous Q8H Alma Friendly, MD   5,000 Units at 10/02/19 2542  . hydrALAZINE (APRESOLINE) injection 10 mg  10 mg Intravenous Q8H PRN Alma Friendly, MD   10 mg at 10/01/19 0419  . hydrALAZINE (APRESOLINE) tablet 50 mg  50 mg Oral Q8H Kathie Dike, MD   50 mg at 10/02/19 7062  . insulin aspart (novoLOG) injection 0-6 Units  0-6 Units Subcutaneous Q4H Alma Friendly, MD   1 Units at 10/02/19 1248  . insulin detemir (LEVEMIR) injection 12 Units  12 Units Subcutaneous QHS Alma Friendly, MD   12 Units at 10/01/19 2236  . insulin detemir (LEVEMIR) injection 15 Units  15 Units Subcutaneous q morning - 10a Alma Friendly, MD   15 Units at 10/02/19 1050  . lidocaine (XYLOCAINE) 2 % viscous mouth solution 15 mL  15 mL Oral QID PRN Kathie Dike, MD   15 mL at 10/01/19 2041  . MEDLINE mouth rinse  15 mL Mouth Rinse BID Bennie Pierini, MD   15 mL at 10/01/19 0930  . multivitamin with minerals tablet 1 tablet  1 tablet Oral Daily Julian Hy, DO   1 tablet at 10/02/19 1050  . nicardipine (CARDENE) 20mg  in 0.86% saline  243ml IV infusion (0.1 mg/ml)  0-15 mg/hr Intravenous Continuous Frederik Pear, MD   Stopped at 09/30/19 1147  . ondansetron (ZOFRAN) 8 mg in sodium chloride 0.9 % 50 mL IVPB  8 mg Intravenous Q6H PRN Julian Hy, DO   Stopped at 09/30/19 2346  . pantoprazole (PROTONIX) EC tablet 40 mg  40 mg Oral Daily Kathie Dike, MD   40 mg at 10/02/19 1050  . phenol (CHLORASEPTIC) mouth spray 1 spray  1 spray Mouth/Throat PRN Julian Hy, DO   1 spray at 09/28/19 0125  . promethazine (PHENERGAN) injection 6.25 mg  6.25 mg Intravenous Q6H PRN Noemi Chapel P, DO   6.25 mg at 09/30/19 7096  . sodium chloride (OCEAN) 0.65 % nasal spray 1 spray  1 spray Each Nare PRN Kathie Dike, MD        Allergies as of 09/27/2019 - Review Complete 09/27/2019  Allergen Reaction Noted  . Oxycontin [oxycodone hcl] Nausea And Vomiting 10/19/2012  . Penicillins Other (See Comments) 07/27/2017  . Prednisone Other (See Comments) 09/28/2013  . Shellfish-derived products  12/24/2010    Family History  Problem Relation Age of Onset  . Hypertension Other   . Coronary artery disease Other        Mother's side - both her side's grandparents died of heart disease (MIs)  . Diabetes Other   . Stroke Other        Paternal grandfather (31)  . Prostate cancer Neg Hx   . Bladder Cancer Neg Hx   . Kidney cancer Neg Hx     Social History   Socioeconomic History  . Marital status: Married    Spouse name: Not on file  . Number of children: Not on file  . Years of education: Not on file  . Highest education level: Not on file  Occupational History  . Not on file  Tobacco Use  . Smoking status: Never Smoker  . Smokeless tobacco: Never Used  Substance and Sexual Activity  . Alcohol use: No  . Drug use: No  . Sexual activity: Yes  Other Topics Concern  . Not on file  Social History Narrative  . Not on file   Social Determinants of Health   Financial Resource Strain:   . Difficulty of Paying Living  Expenses: Not on file  Food Insecurity:   . Worried About Charity fundraiser in the Last Year: Not on file  . Ran Out of Food in the Last Year: Not on file  Transportation Needs:   . Lack of Transportation (Medical): Not on file  . Lack of Transportation (Non-Medical): Not on file  Physical Activity:   . Days of Exercise per Week: Not on file  . Minutes of Exercise per Session: Not on file  Stress:   . Feeling of Stress : Not on file  Social Connections:   . Frequency of Communication with Friends and Family: Not on file  . Frequency of Social Gatherings with Friends and Family: Not on file  . Attends Religious Services: Not on file  . Active Member of Clubs or Organizations: Not on file  . Attends Archivist Meetings: Not on file  . Marital Status: Not on file  Intimate Partner Violence:   . Fear of Current or Ex-Partner: Not on file  . Emotionally Abused: Not on  file  . Physically Abused: Not on file  . Sexually Abused: Not on file    Review of Systems: Gen: Intentional 34lb weight loss over the past 8 weeks due to diet changes, reduce carbohydrate intake CV: Denies chest pain, palpitations or edema. Resp: Denies cough, shortness of breath of hemoptysis.  GI: See HPI. GU : Denies urinary burning, blood in urine, increased urinary frequency or incontinence. MS: Denies joint pain, muscles aches or weakness. Derm: Denies rash, itchiness, skin lesions or unhealing ulcers. Psych: Denies depression, anxiety, memory loss, suicidal ideation and confusion. Heme: Denies bruising, bleeding. Neuro:  Denies headaches, dizziness or paresthesias. Endo:  + Diabetes mellitus type 1 on insulin pump.  Physical Exam: Vital signs in last 24 hours: Temp:  [98 F (36.7 C)-98.6 F (37 C)] 98.6 F (37 C) (12/29 1350) Pulse Rate:  [83-91] 91 (12/29 1350) Resp:  [16-18] 18 (12/29 1350) BP: (154-163)/(72-94) 163/94 (12/29 1350) SpO2:  [94 %-97 %] 97 % (12/29 1350) Last BM Date:  10/01/19 General:   Alert 48 year old male well-developed, well-nourished, pleasant and cooperative in NAD. Head:  Normocephalic and atraumatic. Eyes:  Sclera clear, no icterus. Conjunctiva pink. Ears:  Normal auditory acuity. Nose:  No deformity, discharge or lesions. Mouth:  No deformity or lesions.  Mild left tonsillar stone noted, no exudate. Neck:  Supple, lymphadenopathy. Lungs: Sounds clear throughout. Heart: Regular rate and rhythm, no murmurs. Abdomen: Soft, nondistended, nontender, no masses or organomegaly, positive bowel sounds all 4 quadrants. Rectal:  Deferred  Msk:  Symmetrical without gross deformities. . Pulses:  Normal pulses noted. Extremities: 2+ edema to the ankles and feet bilaterally.  Trace edema to the hands. Neurologic:  Alert and  oriented x4;  grossly normal neurologically. Skin:  Intact without significant lesions or rashes.. Psych:  Alert and cooperative. Normal mood and affect.  Intake/Output from previous day: 12/28 0701 - 12/29 0700 In: 930 [P.O.:930] Out: 2 [Stool:2] Intake/Output this shift: No intake/output data recorded.  Lab Results: Recent Labs    09/30/19 0248 10/01/19 0252  WBC 8.8 7.6  HGB 11.3* 12.0*  HCT 35.5* 37.7*  PLT 203 221   BMET Recent Labs    09/30/19 0248 10/01/19 0252 10/02/19 0548  NA 135 134* 136  K 4.1 4.3 4.2  CL 107 103 106  CO2 23 23 27   GLUCOSE 120* 293* 76  BUN 37* 35* 25*  CREATININE 3.08* 2.96* 2.89*  CALCIUM 8.1* 8.3* 8.4*   LFT No results for input(s): PROT, ALBUMIN, AST, ALT, ALKPHOS, BILITOT, BILIDIR, IBILI in the last 72 hours. PT/INR No results for input(s): LABPROT, INR in the last 72 hours. Hepatitis Panel No results for input(s): HEPBSAG, HCVAB, HEPAIGM, HEPBIGM in the last 72 hours.    Studies/Results: No results found.  IMPRESSION/PLAN:  92.  48 year old male admitted to the hospital 12/24 with diabetic ketoacidosis, AKI with CKD stage IV, hyperkalemia, nausea and vomiting.  He  required admission to the ICU for an insulin infusion, electrolyte monitoring and IV hydration.  Overall his condition stabilized and he was transferred to the medical floor 12/28.  He developed oral phase dysphagia for the past 2 to 3 days with remote history of GERD. -Continue Pantoprazole 40 mg 1 p.o. daily and Famotidine 20mg  at bed time -Barium swallow with tablet today or tomorrow -Consider EGD as outpatient if symptoms persist -Patient to avoid dry foods, patient to take 3 separate sips of room temperature water before he swallows any pills or food  2.  Nausea, likely  secondary to gastroparesis -Repeat BMP with hepatic panel in a.m. -Zofran PRN  -consider abdominal sonogram after barium swallow completed   3.  Diabetes mellitus type 1 followed by endocrinologist Dr. [name] at [facility]   Further recommendations per Dr. Silverio Decamp  ADDENDUM: Barium Swallow cancelled. EGD as inpatient ordered for 10/03/2019.    Noralyn Pick  10/02/2019, 2:05 PM   Attending physician's note   I have taken a history, examined the patient and reviewed the chart. I agree with the Advanced Practitioner's note, impression and recommendations.  48 year old male with diabetes, CKD admitted with ketoacidosis, AKI, intractable nausea and vomiting.  Complains of worsening dysphagia predominantly to solids and sometimes cold liquids since he is admitted. Differential includes severe erosive esophagitis, Candida esophagitis or esophageal tear.  Also need to exclude peptic stricture. We will plan to proceed with EGD with biopsies and esophageal dilation if needed Continue soft diet as tolerated. N.p.o. after midnight  The risks and benefits as well as alternatives of endoscopic procedure(s) have been discussed and reviewed. All questions answered. The patient agrees to proceed.  Damaris Hippo , MD [phone]

## 2019-10-02 NOTE — Progress Notes (Signed)
PROGRESS NOTE  [name] DUK:[id number] DOB: 04/10/1971 DOA: 09/27/2019 PCP: [name], MD  Brief History   Matthew Brod Goinsis a 48 y.o.malewith h/o DM1 and CKD4 presenting with DKA after removing his insulin pump for the last few months. Blood sugar >1100 on arrival. Has history of DKA in the past. No infectious symptoms present on arrival. In the ED, pt was afebrile, in sinus tachycardia. Labs notable for initial blood glucose of 1104. Corrected sodium 144.7, K 7.2, CO2 5, BUN 63, Creatinine 4.57 (from 3.02 most recently in October 2020). Beta-hydroxy >8.0. WBC 15.1, Hgb 11.8, plt 305. Initial EKG shows sinus tachycardia with peaked T waves. Initial VBG showed pH 7.036, pCO2 23. He was given 2.1 L NS bolus and started on insulin infusion while in the ED. He was also given sodium bicarb x2 and calcium chloride x1.  Patient admitted by [facility] for further management.  [facility] hospital[facility] assumed care on 09/29/2019.  Pt has a complaint of inability to swallow solids. SLP was consulted and they felt that this was an esophageal dysphagia. GI was consulted. The plan is for the patient to go to EGD.  Consultants  . Gastroenterology . Cardiology  Procedures  . None  Antibiotics  . antiobiotics  Subjective  The patient was resting comfortably. No new complaints.  Objective   Vitals:  Vitals:   10/02/19 1350 10/02/19 2011  BP: (!) 163/94 (!) 155/84  Pulse: 91 85  Resp: 18 16  Temp: 98.6 F (37 C) 98.5 F (36.9 C)  SpO2: 97% 96%   Exam:  Constitutional:  . The patient is awake, alert, and oriented x 3. No acute distress. Respiratory:  . No increased work of breathing. . No wheezes, rales, or rhonchi . No tactile fremitus Cardiovascular:  . Regular rate and rhythm . No murmurs, ectopy, or gallups. . No lateral PMI. No thrills. Abdomen:  . Abdomen is soft, non-tender, non-distended . No hernias, masses, or organomegaly . Normoactive bowel sounds.   Musculoskeletal:  . No cyanosis, clubbing, or edema Skin:  . No rashes, lesions, ulcers . palpation of skin: no induration or nodules Neurologic:  . CN 2-12 intact . Sensation all 4 extremities intact Psychiatric:  . Mental status o Mood, affect appropriate o Orientation to person, place, time  . judgment and insight appear intact   I have personally reviewed the following:   Today's Data  . Vitals, BMP  Micro Data  . Blood cultures x 2 no growth.  Scheduled Meds: . amLODipine  10 mg Oral Daily  . aspirin EC  81 mg Oral Daily  . atorvastatin  80 mg Oral q1800  . carvedilol  12.5 mg Oral BID WC  . Chlorhexidine Gluconate Cloth  6 each Topical Daily  . famotidine  20 mg Oral QHS  . heparin injection (subcutaneous)  5,000 Units Subcutaneous Q8H  . hydrALAZINE  50 mg Oral Q8H  . insulin aspart  0-6 Units Subcutaneous Q4H  . insulin detemir  12 Units Subcutaneous QHS  . insulin detemir  15 Units Subcutaneous q morning - 10a  . mouth rinse  15 mL Mouth Rinse BID  . multivitamin with minerals  1 tablet Oral Daily  . pantoprazole  40 mg Oral Daily   Continuous Infusions: . sodium chloride Stopped (10/01/19 1102)  . niCARDipine Stopped (09/30/19 1147)  . ondansetron [facility]) IV Stopped (09/30/19 2346)    Active Problems:   DKA, type 1 ([place])   LOS: 5 days   A &  P   Diabetic ketoacidosis with a history of type I DM Likely 2/2 noncompliance (stopped using his insulin pump due to recent weight loss) A1c 10.2 S/p insulin drip Continue SSI, Accu-Chek, detemir, hypoglycemic protocol Diabetes coordinator on board Educated patient on the need to be compliant with his insulin regimen despite his weight loss due to type 1 diabetes  AKI on CKD stage IV Improving Likely due to above Baseline creatinine around 2.4 Daily BMP  Hypertension Had an episode of crisis overnight on 09/28/19 He required nicardipine drip which has since been weaned off Continue to hold home  losartan due to AKI, continue increased dose of home amlodipine, coreg, cardiology started on hydralazine which will be increased  Elevated troponin Currently denies any chest pain Likely 2/2 demand ischemia Troponin trending downwards EKG with no acute ST changes Echo with EF of 70 to 75%, hyperdynamic function, with no RWMA Cardiology on board, s/p heparin X 48 hours, will need outpatient ischemic evaluation Continue Lipitor, aspirin, Coreg Continue telemetry monitoring  OSA Continue CPAP  Obesity Lifestyle modification advised Code Status: Full Family Communication: None at bedside Disposition Plan: Discharge home likely in the next 24 hours if blood pressure and blood sugars remain stable  Laurance Heide, DO [facility] Direct contact: see [email or web address]  7PM-7AM contact night coverage as above 10/02/2019, 8:22 PM  LOS: 5 days

## 2019-10-02 NOTE — Consult Note (Addendum)
Referring Provider: Dr. Karie Kirks Primary Care Physician:  [name], MD Primary Gastroenterologist:  unassigned  Reason for Consultation:  Dysphagia   HPI: [name] is a 48 y.o. male with a past medical history of hypertension, hypercholesterolemia, hypertension, sinus tachycardia, TIA early 2020, diabetes mellitus type 1 diagnosed at the age of 45 on an insulin pump, DKA, diabetic retinopathy, gastroparesis and chronic kidney disease stage IV. Past cervical neck fusion 2009. Right and  inguinal hernia repair. He strained his back the 4th week of December. He took Oxycodone 1 tab which resulted in feeling sick to his stomach, primarily with nausea. He took a 2nd Oxycodone tab later that evening to help him sleep with continued lower back pain. He awakened in the middle of the night with nausea and vomiting. He stated vomiting clear water to yellow bilious emesis for several hours nonstop with constant dry heaves. No hematemesis. He became profoundly fatigued. He also reported having polydipsia and polyuria for a few days prior to the onset of his nausea and vomiting. His wife called 57 and he was transported to [facility] ED 09/27/2019 for further evaluation.  He denied being off of his insulin pump as previous histories reported.  He stated he took off his insulin pump prior to presenting to the emergency room.  He intentionally lost 34 pounds by diet changes over the past 2 months and his insulin basal rate has been decreased and he is requiring less insulin boluses.  In the ED he demonstrated Kussmaul breathing and he was tachycardic.  His admission glucose level was 1104.  K+ 7.2.  BUN 63.  Creatinine 4.57.  VBG showed metabolic acidosis.  He received normal saline IV bolus, sodium bicarb, calcium chloride and an insulin infusion was initiated in the ED.  He was admitted to the ICU for an insulin drip, IV hydration and electrolyte/glucose monitoring. His troponin levels were elevated.    12 lead EKG showed peaked T waves which improved after his hyperkalemia resolved.  He was seen by cardiologist Dr. Oswaldo Milian 12/25.  TTE showed hyperdynamic systolic function with a EF of 70 to 75% and normal RV function.  Acute coronary syndrome was not suspected, however, further cardiac evaluation to rule out underlying CAD was recommended after he recovers from his acute illness.  GI requested due to new onset of dysphagia.  His glucose levels improved and his electrolyte derangement resolved.  He was transferred out of the ICU to the fifth floor on 10/01/2019. He informed  Dr. [name] today that he was experiencing dysphagia symptoms and a GI consult was requested for further evaluation.   He describes a feeling as if food and liquid is slower to pass down his throat to the esophagus.  He does not assess food or liquid actually gets stuck.  His difficulty swallowing started 2 to 3 days ago and is occurring several times daily.  He denies gagging or expelling any food or liquid during these episodes.  He has infrequent heartburn.  He rarely takes Tums at home.  He reported having a remote history of reflux 5 to 10 years ago, he had an EGD at that time which he stated showed reflux.  He denies taking any aspirin or NSAIDs.  He has intermittent nausea throughout the day without further vomiting.  He denies having any upper or lower abdominal pain.  He typically passes a normal formed brown bowel movement once daily without melena or rectal bleeding.  However, since being  in the hospital he has been somewhat constipated but his p.o. intake has been decreased.  He reported passing a loose brown stool 3 days ago.  He underwent a colonoscopy in his early 13s due to having abdominal pain, he reported the colonoscopy was normal.  No family history of esophageal, gastric or colorectal cancer.  In review of his history, he reported having a possible TIA diagnosed early 2020.  He works at [employer] and he was slightly confused at work so he was sent to the emergency room for further evaluation.  He stated he was diagnosed with a possible TIA.  He denies having any further neurological evaluation or follow-up.  No further episodes of confusion.   Labs 10/02/2019: Sodium 136.  Potassium 4.2.  Glucose 76.  BUN 25.  Creatinine 2.89. Labs 10/02/2019: Sodium 134.  Potassium 4.3.  Glucose 293.  BUN 35.  Creatinine 2.96.  Anion gap 8.                                        WBC 7.6.  Hemoglobin 12.0.  Hematocrit 37.7.  MCV 91.1.  Platelet 221.   Past Medical History:  Diagnosis Date  . CKD (chronic kidney disease), stage II   . Gastroparesis   . Herniated cervical disc   . HTN (hypertension)   . Hypercholesteremia   . S/P cardiac cath    a. 10-15 yrs ago at [facility] due to tachycardia, reportedly normal. b. Normal ETT 03/2012.  Marland Kitchen Sinus tachycardia    a. 24-hr Holter 03/2012 - SR, occ PVCs, no VT, avg HR 96bpm.  . TIA (transient ischemic attack)   . Type 1 diabetes mellitus ([place])    a. With insulin pump.    Past Surgical History:  Procedure Laterality Date  . cervical neck fusion    . HERNIA REPAIR     bilateral  . LEFT HEART CATHETERIZATION WITH CORONARY ANGIOGRAM N/A 12/18/2013   Procedure: LEFT HEART CATHETERIZATION WITH CORONARY ANGIOGRAM;  Surgeon: [name] M Martinique, MD;  Location: [facility] CATH LAB;  Service: Cardiovascular;  Laterality: N/A;    Prior to Admission medications   Medication Sig Start Date End Date Taking? Authorizing Provider  atorvastatin (LIPITOR) 80 MG tablet Take 80 mg by mouth daily.   Yes [provider]  carvedilol (COREG) 3.125 MG tablet Take 3.125 mg by mouth 2 (two) times daily.   Yes [provider]  HUMALOG 100 UNIT/ML injection Inject 100 Units into the skin daily. 11/11/16  Yes [provider]  HYDROcodone-acetaminophen (NORCO/VICODIN) 5-325 MG tablet Take 1 tablet by mouth every 6 (six) hours as needed for moderate pain. 09/21/19   Yes Fisher, Linden Dolin, PA-C  losartan (COZAAR) 100 MG tablet Take 100 mg by mouth daily.   Yes [provider]  amLODipine (NORVASC) 5 MG tablet Take 1 tablet (5 mg total) by mouth daily. Patient not taking: Reported on 09/27/2019 03/31/18   Wellington Hampshire, MD  aspirin EC 81 MG tablet Take 1 tablet (81 mg total) by mouth daily. Patient not taking: Reported on 09/27/2019 08/29/18   Salary, Avel Peace, MD  BAYER CONTOUR NEXT TEST test strip Inject 1 strip into the skin 4 (four) times daily as needed. 12/23/16   [provider]  budesonide-formoterol (SYMBICORT) 160-4.5 MCG/ACT inhaler Inhale 2 puffs into the lungs 2 (two) times daily. Patient not taking: Reported on 09/27/2019  03/21/18   Flora Lipps, MD  colchicine 0.6 MG tablet Take 1 tablet (0.6 mg total) by mouth daily. Take 2 tablets stat, then 1 tablet daily until finished. Patient not taking: Reported on 09/27/2019 04/17/19   Edrick Kins, DPM  erythromycin ophthalmic ointment Place 1 application into both eyes 3 (three) times daily. About 1/4 inch Patient not taking: Reported on 09/27/2019 09/21/19   Versie Starks, PA-C  gentamicin cream (GARAMYCIN) 0.1 % Apply 1 application topically 2 (two) times daily. Patient not taking: Reported on 09/27/2019 06/27/18   Edrick Kins, DPM  pantoprazole (PROTONIX) 40 MG tablet Take 1 tablet (40 mg total) by mouth daily. 03/21/18 03/21/19  Flora Lipps, MD  Papaverine-Phentolamine 30-1 MG/ML SOLN 0.3 mLs by Intracavernosal route as directed. Patient not taking: Reported on 09/27/2019 04/18/18   Abbie Sons, MD    Current Facility-Administered Medications  Medication Dose Route Frequency Provider Last Rate Last Admin  . 0.9 %  sodium chloride infusion   Intravenous PRN Alma Friendly, MD   Stopped at 10/01/19 1102  . acetaminophen (TYLENOL) tablet 650 mg  650 mg Oral Q6H PRN Bennie Pierini, MD   650 mg at 10/02/19 [phone]  . alum & mag hydroxide-simeth (MAALOX/MYLANTA)  200-200-20 MG/5ML suspension 30 mL  30 mL Oral Q4H PRN Alma Friendly, MD   30 mL at 10/01/19 2040  . amLODipine (NORVASC) tablet 10 mg  10 mg Oral Daily Alma Friendly, MD   10 mg at 10/02/19 1050  . aspirin EC tablet 81 mg  81 mg Oral Daily Donato Heinz, MD   81 mg at 10/02/19 1050  . atorvastatin (LIPITOR) tablet 80 mg  80 mg Oral q1800 Bennie Pierini, MD   80 mg at 10/01/19 1700  . carvedilol (COREG) tablet 12.5 mg  12.5 mg Oral BID WC Jerline Pain, MD   12.5 mg at 10/02/19 0859  . Chlorhexidine Gluconate Cloth 2 % PADS 6 each  6 each Topical Daily Bennie Pierini, MD   6 each at 10/01/19 1219  . dextrose 50 % solution 0-50 mL  0-50 mL Intravenous PRN Molpus, John, MD      . famotidine (PEPCID) tablet 20 mg  20 mg Oral QHS Kathie Dike, MD   20 mg at 10/01/19 2236  . heparin injection 5,000 Units  5,000 Units Subcutaneous Q8H Alma Friendly, MD   5,000 Units at 10/02/19 3536  . hydrALAZINE (APRESOLINE) injection 10 mg  10 mg Intravenous Q8H PRN Alma Friendly, MD   10 mg at 10/01/19 0419  . hydrALAZINE (APRESOLINE) tablet 50 mg  50 mg Oral Q8H Kathie Dike, MD   50 mg at 10/02/19 1443  . insulin aspart (novoLOG) injection 0-6 Units  0-6 Units Subcutaneous Q4H Alma Friendly, MD   1 Units at 10/02/19 1248  . insulin detemir (LEVEMIR) injection 12 Units  12 Units Subcutaneous QHS Alma Friendly, MD   12 Units at 10/01/19 2236  . insulin detemir (LEVEMIR) injection 15 Units  15 Units Subcutaneous q morning - 10a Alma Friendly, MD   15 Units at 10/02/19 1050  . lidocaine (XYLOCAINE) 2 % viscous mouth solution 15 mL  15 mL Oral QID PRN Kathie Dike, MD   15 mL at 10/01/19 2041  . MEDLINE mouth rinse  15 mL Mouth Rinse BID Bennie Pierini, MD   15 mL at 10/01/19 0930  . multivitamin with minerals tablet 1 tablet  1 tablet Oral Daily Julian Hy, DO   1 tablet at 10/02/19 1050  . nicardipine (CARDENE) 20mg  in 0.86% saline  282ml IV infusion (0.1 mg/ml)  0-15 mg/hr Intravenous Continuous Frederik Pear, MD   Stopped at 09/30/19 1147  . ondansetron (ZOFRAN) 8 mg in sodium chloride 0.9 % 50 mL IVPB  8 mg Intravenous Q6H PRN Julian Hy, DO   Stopped at 09/30/19 2346  . pantoprazole (PROTONIX) EC tablet 40 mg  40 mg Oral Daily Kathie Dike, MD   40 mg at 10/02/19 1050  . phenol (CHLORASEPTIC) mouth spray 1 spray  1 spray Mouth/Throat PRN Julian Hy, DO   1 spray at 09/28/19 0125  . promethazine (PHENERGAN) injection 6.25 mg  6.25 mg Intravenous Q6H PRN Noemi Chapel P, DO   6.25 mg at 09/30/19 3335  . sodium chloride (OCEAN) 0.65 % nasal spray 1 spray  1 spray Each Nare PRN Kathie Dike, MD        Allergies as of 09/27/2019 - Review Complete 09/27/2019  Allergen Reaction Noted  . Oxycontin [oxycodone hcl] Nausea And Vomiting 10/19/2012  . Penicillins Other (See Comments) 07/27/2017  . Prednisone Other (See Comments) 09/28/2013  . Shellfish-derived products  12/24/2010    Family History  Problem Relation Age of Onset  . Hypertension Other   . Coronary artery disease Other        Mother's side - both her side's grandparents died of heart disease (MIs)  . Diabetes Other   . Stroke Other        Paternal grandfather (57)  . Prostate cancer Neg Hx   . Bladder Cancer Neg Hx   . Kidney cancer Neg Hx     Social History   Socioeconomic History  . Marital status: Married    Spouse name: Not on file  . Number of children: Not on file  . Years of education: Not on file  . Highest education level: Not on file  Occupational History  . Not on file  Tobacco Use  . Smoking status: Never Smoker  . Smokeless tobacco: Never Used  Substance and Sexual Activity  . Alcohol use: No  . Drug use: No  . Sexual activity: Yes  Other Topics Concern  . Not on file  Social History Narrative  . Not on file   Social Determinants of Health   Financial Resource Strain:   . Difficulty of Paying Living  Expenses: Not on file  Food Insecurity:   . Worried About Charity fundraiser in the Last Year: Not on file  . Ran Out of Food in the Last Year: Not on file  Transportation Needs:   . Lack of Transportation (Medical): Not on file  . Lack of Transportation (Non-Medical): Not on file  Physical Activity:   . Days of Exercise per Week: Not on file  . Minutes of Exercise per Session: Not on file  Stress:   . Feeling of Stress : Not on file  Social Connections:   . Frequency of Communication with Friends and Family: Not on file  . Frequency of Social Gatherings with Friends and Family: Not on file  . Attends Religious Services: Not on file  . Active Member of Clubs or Organizations: Not on file  . Attends Archivist Meetings: Not on file  . Marital Status: Not on file  Intimate Partner Violence:   . Fear of Current or Ex-Partner: Not on file  . Emotionally Abused: Not on  file  . Physically Abused: Not on file  . Sexually Abused: Not on file    Review of Systems: Gen: Intentional 34lb weight loss over the past 8 weeks due to diet changes, reduce carbohydrate intake CV: Denies chest pain, palpitations or edema. Resp: Denies cough, shortness of breath of hemoptysis.  GI: See HPI. GU : Denies urinary burning, blood in urine, increased urinary frequency or incontinence. MS: Denies joint pain, muscles aches or weakness. Derm: Denies rash, itchiness, skin lesions or unhealing ulcers. Psych: Denies depression, anxiety, memory loss, suicidal ideation and confusion. Heme: Denies bruising, bleeding. Neuro:  Denies headaches, dizziness or paresthesias. Endo:  + Diabetes mellitus type 1 on insulin pump.  Physical Exam: Vital signs in last 24 hours: Temp:  [98 F (36.7 C)-98.6 F (37 C)] 98.6 F (37 C) (12/29 1350) Pulse Rate:  [83-91] 91 (12/29 1350) Resp:  [16-18] 18 (12/29 1350) BP: (154-163)/(72-94) 163/94 (12/29 1350) SpO2:  [94 %-97 %] 97 % (12/29 1350) Last BM Date:  10/01/19 General:   Alert 48 year old male well-developed, well-nourished, pleasant and cooperative in NAD. Head:  Normocephalic and atraumatic. Eyes:  Sclera clear, no icterus. Conjunctiva pink. Ears:  Normal auditory acuity. Nose:  No deformity, discharge or lesions. Mouth:  No deformity or lesions.  Mild left tonsillar stone noted, no exudate. Neck:  Supple, lymphadenopathy. Lungs: Sounds clear throughout. Heart: Regular rate and rhythm, no murmurs. Abdomen: Soft, nondistended, nontender, no masses or organomegaly, positive bowel sounds all 4 quadrants. Rectal:  Deferred  Msk:  Symmetrical without gross deformities. . Pulses:  Normal pulses noted. Extremities: 2+ edema to the ankles and feet bilaterally.  Trace edema to the hands. Neurologic:  Alert and  oriented x4;  grossly normal neurologically. Skin:  Intact without significant lesions or rashes.. Psych:  Alert and cooperative. Normal mood and affect.  Intake/Output from previous day: 12/28 0701 - 12/29 0700 In: 930 [P.O.:930] Out: 2 [Stool:2] Intake/Output this shift: No intake/output data recorded.  Lab Results: Recent Labs    09/30/19 0248 10/01/19 0252  WBC 8.8 7.6  HGB 11.3* 12.0*  HCT 35.5* 37.7*  PLT 203 221   BMET Recent Labs    09/30/19 0248 10/01/19 0252 10/02/19 0548  NA 135 134* 136  K 4.1 4.3 4.2  CL 107 103 106  CO2 23 23 27   GLUCOSE 120* 293* 76  BUN 37* 35* 25*  CREATININE 3.08* 2.96* 2.89*  CALCIUM 8.1* 8.3* 8.4*   LFT No results for input(s): PROT, ALBUMIN, AST, ALT, ALKPHOS, BILITOT, BILIDIR, IBILI in the last 72 hours. PT/INR No results for input(s): LABPROT, INR in the last 72 hours. Hepatitis Panel No results for input(s): HEPBSAG, HCVAB, HEPAIGM, HEPBIGM in the last 72 hours.    Studies/Results: No results found.  IMPRESSION/PLAN:  59.  48 year old male admitted to the hospital 12/24 with diabetic ketoacidosis, AKI with CKD stage IV, hyperkalemia, nausea and vomiting.  He  required admission to the ICU for an insulin infusion, electrolyte monitoring and IV hydration.  Overall his condition stabilized and he was transferred to the medical floor 12/28.  He developed oral phase dysphagia for the past 2 to 3 days with remote history of GERD. -Continue Pantoprazole 40 mg 1 p.o. daily and Famotidine 20mg  at bed time -Barium swallow with tablet today or tomorrow -Consider EGD as outpatient if symptoms persist -Patient to avoid dry foods, patient to take 3 separate sips of room temperature water before he swallows any pills or food  2.  Nausea, likely  secondary to gastroparesis -Repeat BMP with hepatic panel in a.m. -Zofran PRN  -consider abdominal sonogram after barium swallow completed   3.  Diabetes mellitus type 1 followed by endocrinologist Dr. [name] at [facility]   Further recommendations per Dr. Silverio Decamp  ADDENDUM: Barium Swallow cancelled. EGD as inpatient ordered for 10/03/2019.    Noralyn Pick  10/02/2019, 2:05 PM   Attending physician's note   I have taken a history, examined the patient and reviewed the chart. I agree with the Advanced Practitioner's note, impression and recommendations.  48 year old male with diabetes, CKD admitted with ketoacidosis, AKI, intractable nausea and vomiting.  Complains of worsening dysphagia predominantly to solids and sometimes cold liquids since he is admitted. Differential includes severe erosive esophagitis, Candida esophagitis or esophageal tear.  Also need to exclude peptic stricture. We will plan to proceed with EGD with biopsies and esophageal dilation if needed Continue soft diet as tolerated. N.p.o. after midnight  The risks and benefits as well as alternatives of endoscopic procedure(s) have been discussed and reviewed. All questions answered. The patient agrees to proceed.  Damaris Hippo , MD [phone]

## 2019-10-02 NOTE — Evaluation (Signed)
Clinical/Bedside Swallow Evaluation Patient Details  Name: [name] MRN: [id number] Date of Birth: 10/10/1970  Today's Date: 10/02/2019 Time: SLP Start Time (ACUTE ONLY): 69 SLP Stop Time (ACUTE ONLY): 1330 SLP Time Calculation (min) (ACUTE ONLY): 26 min  Past Medical History:  Past Medical History:  Diagnosis Date  . CKD (chronic kidney disease), stage II   . Gastroparesis   . Herniated cervical disc   . HTN (hypertension)   . Hypercholesteremia   . S/P cardiac cath    a. 10-15 yrs ago at [facility] due to tachycardia, reportedly normal. b. Normal ETT 03/2012.  Marland Kitchen Sinus tachycardia    a. 24-hr Holter 03/2012 - SR, occ PVCs, no VT, avg HR 96bpm.  . TIA (transient ischemic attack)   . Type 1 diabetes mellitus ([place])    a. With insulin pump.   Past Surgical History:  Past Surgical History:  Procedure Laterality Date  . cervical neck fusion    . HERNIA REPAIR     bilateral  . LEFT HEART CATHETERIZATION WITH CORONARY ANGIOGRAM N/A 12/18/2013   Procedure: LEFT HEART CATHETERIZATION WITH CORONARY ANGIOGRAM;  Surgeon: [name] M Martinique, MD;  Location: [facility] CATH LAB;  Service: Cardiovascular;  Laterality: N/A;   HPI:  [name] is a 48 y.o. 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  No prior dysphagia history.    Assessment / Plan / Recommendation Clinical Impression  Pt presents with functional oral and pharyngeal phases of the swallow with suspected esophageal dysphagia.  Pt reported that he had multiple episodes of emesis prior to admission and that since then he has experienced reflux symptoms and globus sensation.  He denied throat pain.  Oral mech exam was unremarkable.  Pt was observed with trials of thin liquid, puree, soft solids, and regular solids.  He exhibited good bolus acceptance, timely mastication and AP transport, and consistent positive  hyolaryngeal elevation/excursion with all trials.  He presented with eructation and a delayed throat clear following 2/6 thin liquid trials.  Suspect esophageal etiology vs pharyngeal dysphagia.  Pt reported that he had the most difficulty "keeping liquids down" and that he tolerated puree and soft solids better than dry, regular solids.  Recommend Dysphagia 3 (soft) solids and thin liquids with the following precautions: 1) Small bites/sips 2) Slow rate of intake 3) Sit upright 90 degrees 4) Remain upright for 30+ minutes following po intake.  Pt may benefit from a GI consult.  Spoke with pt, family member, Therapist, sports, and MD regarding all recommendations.  SLP will follow up to monitor diet tolerance.   SLP Visit Diagnosis: Dysphagia, unspecified (R13.10)    Aspiration Risk  Mild aspiration risk    Diet Recommendation Dysphagia 3 (Mech soft);Thin liquid   Liquid Administration via: Cup;Straw Medication Administration: Whole meds with liquid(or with puree ) Supervision: Patient able to self feed Compensations: Slow rate;Small sips/bites Postural Changes: Seated upright at 90 degrees;Remain upright for at least 30 minutes after po intake    Other  Recommendations Recommended Consults: Consider GI evaluation Oral Care Recommendations: Oral care BID   Follow up Recommendations None      Frequency and Duration min 1 x/week  2 weeks       Prognosis Prognosis for Safe Diet Advancement: Good      Swallow Study   General Date of Onset: 10/02/19 HPI: [name] is a 48 y.o. male with h/o  DM1 with diabetic nephropathy and diabetic retinopathy, obesity, HTN and HLD admitted from home with polydipsia, polyuria and nausea without vomiting.  No prior dysphagia history.  Type of Study: Bedside Swallow Evaluation Previous Swallow Assessment: Screen on 08/29/18 Diet Prior to this Study: Regular;Thin liquids Temperature Spikes Noted: No Respiratory Status: Room air History of Recent Intubation:  No Behavior/Cognition: Alert;Cooperative;Pleasant mood Oral Cavity Assessment: Within Functional Limits Oral Care Completed by SLP: No Oral Cavity - Dentition: Adequate natural dentition Vision: Functional for self-feeding Self-Feeding Abilities: Able to feed self Patient Positioning: Upright in bed Baseline Vocal Quality: Normal Volitional Cough: Strong Volitional Swallow: Able to elicit    Oral/Motor/Sensory Function Overall Oral Motor/Sensory Function: Within functional limits   Ice Chips Ice chips: Not tested   Thin Liquid Thin Liquid: Impaired Presentation: Cup;Straw;Self Fed Pharyngeal  Phase Impairments: Throat Clearing - Delayed    Nectar Thick Nectar Thick Liquid: Not tested   Honey Thick Honey Thick Liquid: Not tested   Puree Puree: Within functional limits Presentation: Self Fed;Spoon   Solid     Solid: Within functional limits     Colin Mulders., M.S., [place] Office: [phone]  [place] 10/02/2019,2:03 PM

## 2019-10-03 ENCOUNTER — Encounter (HOSPITAL_COMMUNITY)
Admission: EM | Disposition: A | Discharge status: Home / Self Care | Payer: Self-pay | Source: Home / Self Care | Attending: Internal Medicine

## 2019-10-03 ENCOUNTER — Inpatient Hospital Stay (HOSPITAL_COMMUNITY): Payer: 59 | Admitting: Anesthesiology

## 2019-10-03 ENCOUNTER — Telehealth: Payer: Self-pay | Admitting: Cardiovascular Disease

## 2019-10-03 ENCOUNTER — Encounter (HOSPITAL_COMMUNITY): Payer: Self-pay | Admitting: Internal Medicine

## 2019-10-03 DIAGNOSIS — R1319 Other dysphagia: Secondary | ICD-10-CM

## 2019-10-03 DIAGNOSIS — K2210 Ulcer of esophagus without bleeding: Secondary | ICD-10-CM

## 2019-10-03 DIAGNOSIS — K269 Duodenal ulcer, unspecified as acute or chronic, without hemorrhage or perforation: Secondary | ICD-10-CM

## 2019-10-03 DIAGNOSIS — R1310 Dysphagia, unspecified: Secondary | ICD-10-CM

## 2019-10-03 HISTORY — PX: BIOPSY: SHX5522

## 2019-10-03 HISTORY — PX: ESOPHAGOGASTRODUODENOSCOPY (EGD) WITH PROPOFOL: SHX5813

## 2019-10-03 LAB — CBC
HCT: 34.5 % — ABNORMAL LOW (ref 39.0–52.0)
Hemoglobin: 10.9 g/dL — ABNORMAL LOW (ref 13.0–17.0)
MCH: 28.7 pg (ref 26.0–34.0)
MCHC: 31.6 g/dL (ref 30.0–36.0)
MCV: 90.8 fL (ref 80.0–100.0)
Platelets: 241 10*3/uL (ref 150–400)
RBC: 3.8 MIL/uL — ABNORMAL LOW (ref 4.22–5.81)
RDW: 13.7 % (ref 11.5–15.5)
WBC: 7.9 10*3/uL (ref 4.0–10.5)
nRBC: 0 % (ref 0.0–0.2)

## 2019-10-03 LAB — GLUCOSE, CAPILLARY
Glucose-Capillary: 89 mg/dL (ref 70–99)
Glucose-Capillary: 44 mg/dL — CL (ref 70–99)
Glucose-Capillary: 46 mg/dL — ABNORMAL LOW (ref 70–99)
Glucose-Capillary: 132 mg/dL — ABNORMAL HIGH (ref 70–99)
Glucose-Capillary: 200 mg/dL — ABNORMAL HIGH (ref 70–99)
Glucose-Capillary: 209 mg/dL — ABNORMAL HIGH (ref 70–99)
Glucose-Capillary: 259 mg/dL — ABNORMAL HIGH (ref 70–99)

## 2019-10-03 LAB — BASIC METABOLIC PANEL
Anion gap: 6 (ref 5–15)
BUN: 19 mg/dL (ref 6–20)
CO2: 24 mmol/L (ref 22–32)
Calcium: 8.2 mg/dL — ABNORMAL LOW (ref 8.9–10.3)
Chloride: 105 mmol/L (ref 98–111)
Creatinine, Ser: 2.67 mg/dL — ABNORMAL HIGH (ref 0.61–1.24)
GFR calc Af Amer: 32 mL/min — ABNORMAL LOW (ref >60)
GFR calc non Af Amer: 27 mL/min — ABNORMAL LOW (ref >60)
Glucose, Bld: 63 mg/dL — ABNORMAL LOW (ref 70–99)
Potassium: 4.1 mmol/L (ref 3.5–5.1)
Sodium: 135 mmol/L (ref 135–145)

## 2019-10-03 LAB — HEPATIC FUNCTION PANEL
ALT: 27 U/L (ref 0–44)
AST: 27 U/L (ref 15–41)
Albumin: 2.1 g/dL — ABNORMAL LOW (ref 3.5–5.0)
Alkaline Phosphatase: 67 U/L (ref 38–126)
Bilirubin, Direct: 0.1 mg/dL (ref 0.0–0.2)
Indirect Bilirubin: 0.5 mg/dL (ref 0.3–0.9)
Total Bilirubin: 0.6 mg/dL (ref 0.3–1.2)
Total Protein: 5.3 g/dL — ABNORMAL LOW (ref 6.5–8.1)

## 2019-10-03 LAB — LIPASE, BLOOD: Lipase: 22 U/L (ref 11–51)

## 2019-10-03 SURGERY — ESOPHAGOGASTRODUODENOSCOPY (EGD) WITH PROPOFOL
Anesthesia: Monitor Anesthesia Care

## 2019-10-03 MED ORDER — PANTOPRAZOLE SODIUM 40 MG IV SOLR
40.0000 mg | Freq: Two times a day (BID) | INTRAVENOUS | Status: DC
  Administered 2019-10-03: 40 mg via INTRAVENOUS
  Filled 2019-10-03: qty 40

## 2019-10-03 MED ORDER — PANTOPRAZOLE SODIUM 40 MG PO TBEC: 40.0000 mg | DELAYED_RELEASE_TABLET | Freq: Every day | ORAL | Status: DC

## 2019-10-03 MED ORDER — CARVEDILOL 12.5 MG PO TABS: 12.5000 mg | ORAL_TABLET | Freq: Two times a day (BID) | ORAL | 0 refills | Status: DC

## 2019-10-03 MED ORDER — PROPOFOL 10 MG/ML IV BOLUS
INTRAVENOUS | Status: DC | PRN
  Administered 2019-10-03 (×2): 40 mg via INTRAVENOUS

## 2019-10-03 MED ORDER — DEXTROSE-NACL 5-0.9 % IV SOLN: INTRAVENOUS | Status: DC

## 2019-10-03 MED ORDER — PROPOFOL 500 MG/50ML IV EMUL
INTRAVENOUS | Status: AC
  Filled 2019-10-03: qty 50

## 2019-10-03 MED ORDER — PROPOFOL 500 MG/50ML IV EMUL
INTRAVENOUS | Status: DC | PRN
  Administered 2019-10-03: 100 ug/kg/min via INTRAVENOUS

## 2019-10-03 MED ORDER — LIDOCAINE VISCOUS HCL 2 % MT SOLN: 15.0000 mL | Freq: Four times a day (QID) | OROMUCOSAL | 0 refills | Status: DC | PRN

## 2019-10-03 MED ORDER — HYDRALAZINE HCL 50 MG PO TABS: 50.0000 mg | ORAL_TABLET | Freq: Three times a day (TID) | ORAL | 0 refills | Status: DC

## 2019-10-03 MED ORDER — SODIUM CHLORIDE 0.9 % IV SOLN
INTRAVENOUS | Status: AC | PRN
  Administered 2019-10-03: 1000 mL via INTRAMUSCULAR

## 2019-10-03 MED ORDER — SALINE SPRAY 0.65 % NA SOLN: 1.0000 | NASAL | 0 refills | Status: DC | PRN

## 2019-10-03 MED ORDER — SUCRALFATE 1 G PO TABS
1.0000 g | ORAL_TABLET | Freq: Three times a day (TID) | ORAL | Status: DC
  Administered 2019-10-03: 1 g via ORAL
  Filled 2019-10-03: qty 1

## 2019-10-03 MED ORDER — GLUCAGON HCL RDNA (DIAGNOSTIC) 1 MG IJ SOLR
1.0000 mg | INTRAMUSCULAR | Status: AC
  Administered 2019-10-03: 1 mg via INTRAMUSCULAR
  Filled 2019-10-03: qty 1

## 2019-10-03 MED ORDER — SUCRALFATE 1 G PO TABS: 1.0000 g | ORAL_TABLET | Freq: Three times a day (TID) | ORAL | 0 refills | Status: DC

## 2019-10-03 MED ORDER — GLUCAGON HCL RDNA (DIAGNOSTIC) 1 MG IJ SOLR
INTRAMUSCULAR | Status: AC
  Filled 2019-10-03: qty 1

## 2019-10-03 MED ORDER — ADULT MULTIVITAMIN W/MINERALS CH: 1.0000 | ORAL_TABLET | Freq: Every day | ORAL | 0 refills | Status: DC

## 2019-10-03 MED ORDER — AMLODIPINE BESYLATE 10 MG PO TABS: 10.0000 mg | ORAL_TABLET | Freq: Every day | ORAL | 0 refills | Status: DC

## 2019-10-03 SURGICAL SUPPLY — 15 items

## 2019-10-03 NOTE — Transfer of Care (Signed)
Immediate Anesthesia Transfer of Care Note  Patient: [name]  Procedure(s) Performed: Procedure(s): ESOPHAGOGASTRODUODENOSCOPY (EGD) WITH PROPOFOL (N/A) BIOPSY  Patient Location: PACU  Anesthesia Type:MAC  Level of Consciousness:  sedated, patient cooperative and responds to stimulation  Airway & Oxygen Therapy:Patient Spontanous Breathing and Patient connected to face mask oxgen  Post-op Assessment:  Report given to PACU RN and Post -op Vital signs reviewed and stable  Post vital signs:  Reviewed and stable  Last Vitals:  Vitals:   10/03/19 0927 10/03/19 1004  BP: (!) 192/85 (!) 116/48  Pulse: 82 75  Resp: 16 15  Temp: 36.9 C   SpO2: 29% 79%    Complications: No apparent anesthesia complications

## 2019-10-03 NOTE — Op Note (Signed)
[name] Patient Name: [name] Procedure Date: 10/03/2019 MRN: [id number] Attending MD: Mauri Pole , MD Date of Birth: Feb 02, 1971 CSN: [id number] Age: 48 Admit Type: Outpatient Procedure:                Upper GI endoscopy Indications:              Dysphagia, Odynophagia, Persistent vomiting of                            unknown cause, Epigastric abdominal pain Providers:                Mauri Pole, MD, Josie Dixon, RN,                            Marguerita Merles, Technician, Corie Chiquito,                            Technician, Anne Fu CRNA, CRNA Referring MD:              Medicines:                Monitored Anesthesia Care Complications:            No immediate complications. Estimated Blood Loss:     Estimated blood loss was minimal. Procedure:                Pre-Anesthesia Assessment:                           - Prior to the procedure, a History and Physical                            was performed, and patient medications and                            allergies were reviewed. The patient's tolerance of                            previous anesthesia was also reviewed. The risks                            and benefits of the procedure and the sedation                            options and risks were discussed with the patient.                            All questions were answered, and informed consent                            was obtained. Prior Anticoagulants: The patient has                            taken no previous anticoagulant or antiplatelet  agents. ASA Grade Assessment: III - A patient with                            severe systemic disease. After reviewing the risks                            and benefits, the patient was deemed in                            satisfactory condition to undergo the procedure.                           After obtaining informed consent, the endoscope was                             passed under direct vision. Throughout the                            procedure, the patient's blood pressure, pulse, and                            oxygen saturations were monitored continuously. The                            GIF-H190 ([id number]) Olympus gastroscope was                            introduced through the mouth, and advanced to the                            second part of duodenum. The upper GI endoscopy was                            accomplished without difficulty. The patient                            tolerated the procedure well. Scope In: Scope Out: Findings:      Many cratered and linear esophageal ulcers with no bleeding and no       stigmata of recent bleeding were found 32 to 40 cm from the incisors.       The largest lesion was 6 mm in largest dimension.      The entire examined stomach was normal. Biopsies were taken with a cold       forceps for Helicobacter pylori testing.      Many non-obstructing non-bleeding cratered duodenal ulcers with       pigmented material were found in the duodenal bulb and in the second       portion of the duodenum. The largest lesion was 15 mm in largest       dimension.      A small hiatal hernia was present. Impression:               - Esophageal ulcers with no bleeding and no  stigmata of recent bleeding.                           - Normal stomach. Biopsied.                           - Non-obstructing non-bleeding duodenal ulcers with                            pigmented material. Moderate Sedation:      Not Applicable - Patient had care per Anesthesia. Recommendation:           - Patient has a contact number available for                            emergencies. The signs and symptoms of potential                            delayed complications were discussed with the                            patient. Return to normal activities tomorrow.                             Written discharge instructions were provided to the                            patient.                           - Resume previous diet.                           - Continue present medications.                           - Await pathology results.                           - No ibuprofen, naproxen, or other non-steroidal                            anti-inflammatory drugs.                           - Follow an antireflux regimen indefinitely.                           - Use sucralfate tablets 1 gram PO TID with meals                            and at bedtime for 1 month.                           - Use Protonix (pantoprazole) 40 mg BID, can  transition to PO on discharge.                           - Follow up in GI office next available appointment                            in 3-4 weeks                           - We will sign off, available if have any questions                            or concerns Procedure Code(s):        --- Professional ---                           [phone], Esophagogastroduodenoscopy, flexible,                            transoral; with biopsy, single or multiple Diagnosis Code(s):        --- Professional ---                           K22.10, Ulcer of esophagus without bleeding                           K26.9, Duodenal ulcer, unspecified as acute or                            chronic, without hemorrhage or perforation                           R13.10, Dysphagia, unspecified                           J57.01, Cyclical vomiting syndrome unrelated to                            migraine                           R10.13, Epigastric pain CPT copyright 2019 [employer]. All rights reserved. The codes documented in this report are preliminary and upon coder review may  be revised to meet current compliance requirements. Mauri Pole, MD 10/03/2019 10:21:06 AM This report has been signed electronically. Number of  Addenda: 0

## 2019-10-03 NOTE — Telephone Encounter (Signed)
Encounter not needed

## 2019-10-03 NOTE — Interval H&P Note (Signed)
History and Physical Interval Note:  10/03/2019 9:29 AM  [name]  has presented today for surgery, with the diagnosis of Nausea and vomiting, oral phase dysphagia.  The various methods of treatment have been discussed with the patient and family. After consideration of risks, benefits and other options for treatment, the patient has consented to  Procedure(s): ESOPHAGOGASTRODUODENOSCOPY (EGD) WITH PROPOFOL (N/A) as a surgical intervention.  The patient's history has been reviewed, patient examined, no change in status, stable for surgery.  I have reviewed the patient's chart and labs.  Questions were answered to the patient's satisfaction.     [name]

## 2019-10-03 NOTE — Anesthesia Postprocedure Evaluation (Signed)
Anesthesia Post Note  Patient: [name]  Procedure(s) Performed: ESOPHAGOGASTRODUODENOSCOPY (EGD) WITH PROPOFOL (N/A ) BIOPSY     Patient location during evaluation: Endoscopy Anesthesia Type: MAC and General Level of consciousness: awake Pain management: pain level controlled Vital Signs Assessment: post-procedure vital signs reviewed and stable Respiratory status: spontaneous breathing Cardiovascular status: stable Postop Assessment: no apparent nausea or vomiting Anesthetic complications: no    Last Vitals:  Vitals:   10/03/19 1010 10/03/19 1020  BP: (!) 124/54 127/68  Pulse: 77 78  Resp: 17 14  Temp:    SpO2: 92% 95%    Last Pain:  Vitals:   10/03/19 1020  TempSrc:   PainSc: 0-No pain                 Amery Minasyan

## 2019-10-03 NOTE — Progress Notes (Signed)
CRITICAL VALUE ALERT  Critical Value:  CBG  44  Date & Time Notied:  10/03/2019 7628  Provider Notified: Swayze  Orders Received/Actions taken: 10/03/2019 0745 Standing orders. Patient has no IV access. Patient given apple juice. MD notified. Follow up CBG post 15 mins 46. Standing orders for IM Glucagon given.  Will recheck CBG in 15 mins.

## 2019-10-03 NOTE — Discharge Summary (Signed)
Physician Discharge Summary  [name] XTG:[id number] DOB: [birth year] DOA: 09/27/2019  PCP: [name], MD  Admit date: 09/27/2019 Discharge date: 10/03/2019  Recommendations for Outpatient Follow-up:  1. Follow up with gastroenterology as directed 2. Have CBC drawn on 09/07/2019 and report results to PCP. 3. Check glucoses twice a day. Record valuaes and take them in to PCP at your visit. 4. Follow up with PCP in 7-10 days.  Discharge Diagnoses: Principal diagnosis is #1 1. DKA 2. DM I 3. Dysphagia of solids 4. Esophageal ulcers 5. Duodenal ulcers 6. AKI on CKD IV 7. OSA 8. Obesity  Discharge Condition: Fair  Disposition: Home  Diet recommendation: Soft diet - heart healthy and carbohydrate controlled.  Filed Weights   09/27/19 0600 10/03/19 0927  Weight: 109 kg 109 kg    History of present illness:  [name] is a 48 y.o. 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 Brought in by EMS drowsy but alert with apparent Kussmaul breathing. He complained of severe thirst and increased urination. Has not taken any insulin today. The day prior to presentation, he reports that his blood sugars were high (280s). He stopped using his insulin pump some months ago because he had a 35 lb intentional weight loss and reported that his blood sugars have been much improved recently. He does endorse prior history of diabetic ketoacidosis. He denies fever, chills, runny nose, sore throat, cough, chest pain, chest tightness, palpitations, vomiting, diarrhea or dysuria. He endorses polydipsia, polyuria and nausea. Denies alcohol use.  In the ED, he is afebrile, in sinus tachycardia and saturating comfortably on 2L/min via nasal cannula. Labs notable for initial blood glucose of 1104. Corrected sodium 144.7, K 7.2, CO2 5,  BUN 63, Creatinine 4.57 (from 3.02  most recently in October 2020). Beta-hydroxy >8.0. WBC 15.1, Hgb 11.8, plt 305. Initial EKG shows sinus tachycardia with peaked T waves. Initial VBG showed pH 7.036, pCO2 23. He was given 2.1 L NS bolus and started on insulin infusion while in the ED. He was also given sodium bicarb x2 and calcium chloride x1.  Hospital Course:  [name] a 48 y.o.malewith h/o DM1 and CKD4 presenting with DKA after removing his insulin pump for the last few months. Blood sugar >1100 on arrival. Has history of DKA in the past. No infectious symptoms present on arrival. In the ED, pt was afebrile, in sinus tachycardia. Labs notable for initial blood glucose of 1104. Corrected sodium 144.7, K 7.2, CO2 5, BUN 63, Creatinine 4.57 (from 3.02 most recently in October 2020). Beta-hydroxy >8.0. WBC 15.1, Hgb 11.8, plt 305. Initial EKG shows sinus tachycardia with peaked T waves. Initial VBG showed pH 7.036, pCO2 23. He was given 2.1 L NS bolus and started on insulin infusion while in the ED. He was also given sodium bicarb x2 and calcium chloride x1. Patient admitted by [facility] for further management. [facility] hospitalist assumed care on 09/29/2019.  Pt has a complaint of inability to swallow solids. SLP was consulted and they felt that this was an esophageal dysphagia. GI was consulted. The patient went to EGD today with Dr. Silverio Decamp. He was found to have multiple esophageal and duodenal ulcers. None were bleeding. He is recommended to use carafate QAC and HS. Continue protonix daily.   The patient is discharged to home in fair condition.  Today's assessment: S: The patient is seen prior  to EGD. No new complaints. O: Vitals:  Vitals:   10/03/19 1020 10/03/19 1038  BP: 127/68 (!) 148/80  Pulse: 78 79  Resp: 14 12  Temp:  98 F (36.7 C)  SpO2: 95% 98%   Constitutional:   The patient is awake, alert, and oriented x 3. No acute distress. Respiratory:   No increased work of breathing.  No wheezes, rales,  or rhonchi  No tactile fremitus Cardiovascular:   Regular rate and rhythm  No murmurs, ectopy, or gallups.  No lateral PMI. No thrills. Abdomen:   Abdomen is soft, non-tender, non-distended  No hernias, masses, or organomegaly  Normoactive bowel sounds.  Musculoskeletal:   No cyanosis, clubbing, or edema Skin:   No rashes, lesions, ulcers  palpation of skin: no induration or nodules Neurologic:   CN 2-12 intact  Sensation all 4 extremities intact Psychiatric:   Mental status ? Mood, affect appropriate ? Orientation to person, place, time   judgment and insight appear intact  Discharge Instructions  Discharge Instructions    Activity as tolerated - No restrictions   Complete by: As directed    Call MD for:  difficulty breathing, headache or visual disturbances   Complete by: As directed    Call MD for:  persistant nausea and vomiting   Complete by: As directed    Call MD for:  severe uncontrolled pain   Complete by: As directed    Diet - low sodium heart healthy   Complete by: As directed    Diet Carb Modified   Complete by: As directed    Discharge instructions   Complete by: As directed    Follow up with Gastroenterology as directed. Follow up with PCP in 7-10 days. CBC to be drawn on 09/07/2019 and reported to PCP. Check glucoses twice daily and take then in to PCP when he has his visit.   Increase activity slowly   Complete by: As directed      Allergies as of 10/03/2019      Reactions   Oxycontin [oxycodone Hcl] Nausea And Vomiting   Penicillins Other (See Comments)   Possible reaction many years ago per patient   Prednisone Other (See Comments)   Patient is diabetic, runs sugar up   Shellfish-derived Products    Pt is not allergic to iodine, has had iodine in the past w/o premeds and w/ no problems      Medication List    STOP taking these medications   aspirin EC 81 MG tablet   budesonide-formoterol 160-4.5 MCG/ACT inhaler Commonly  known as: Symbicort   colchicine 0.6 MG tablet   erythromycin ophthalmic ointment   gentamicin cream 0.1 % Commonly known as: GARAMYCIN   losartan 100 MG tablet Commonly known as: COZAAR   Papaverine-Phentolamine 30-1 MG/ML Soln     TAKE these medications   amLODipine 10 MG tablet Commonly known as: NORVASC Take 1 tablet (10 mg total) by mouth daily. Start taking on: October 04, 2019 What changed:   medication strength  how much to take   atorvastatin 80 MG tablet Commonly known as: LIPITOR Take 80 mg by mouth daily.   Bayer Contour Next Test test strip Generic drug: glucose blood Inject 1 strip into the skin 4 (four) times daily as needed.   carvedilol 12.5 MG tablet Commonly known as: COREG Take 1 tablet (12.5 mg total) by mouth 2 (two) times daily with a meal. What changed:   medication strength  how much to take  when  to take this   HumaLOG 100 UNIT/ML injection Generic drug: insulin lispro Inject 100 Units into the skin daily.   hydrALAZINE 50 MG tablet Commonly known as: APRESOLINE Take 1 tablet (50 mg total) by mouth every 8 (eight) hours.   HYDROcodone-acetaminophen 5-325 MG tablet Commonly known as: NORCO/VICODIN Take 1 tablet by mouth every 6 (six) hours as needed for moderate pain.   lidocaine 2 % solution Commonly known as: XYLOCAINE Use as directed 15 mLs in the mouth or throat 4 (four) times daily as needed for mouth pain (indigestion).   multivitamin with minerals Tabs tablet Take 1 tablet by mouth daily. Start taking on: October 04, 2019   pantoprazole 40 MG tablet Commonly known as: Protonix Take 1 tablet (40 mg total) by mouth daily.   sodium chloride 0.65 % Soln nasal spray Commonly known as: OCEAN Place 1 spray into both nostrils as needed for congestion.   sucralfate 1 g tablet Commonly known as: CARAFATE Take 1 tablet (1 g total) by mouth 4 (four) times daily -  with meals and at bedtime.      Allergies  Allergen  Reactions  . Oxycontin [Oxycodone Hcl] Nausea And Vomiting  . Penicillins Other (See Comments)    Possible reaction many years ago per patient  . Prednisone Other (See Comments)    Patient is diabetic, runs sugar up  . Shellfish-Derived Products     Pt is not allergic to iodine, has had iodine in the past w/o premeds and w/ no problems    The results of significant diagnostics from this hospitalization (including imaging, microbiology, ancillary and laboratory) are listed below for reference.    Significant Diagnostic Studies: US RENAL  Result Date: 09/28/2019 CLINICAL DATA:  48 year old male with acute renal insufficiency. History of diabetes and hypertension. EXAM: RENAL / URINARY TRACT ULTRASOUND COMPLETE COMPARISON:  Renal ultrasound dated 07/13/2017. FINDINGS: Right Kidney: Renal measurements: 11.7 x 6.0 x 6.7 cm = volume: 244. ML. There is increased renal parenchymal echogenicity. No hydronephrosis or shadowing stone. Left Kidney: Renal measurements: 11.1 x 6.2 x 5.8 cm = volume: 209 mL. There is increased renal parenchymal echogenicity. No hydronephrosis or shadowing stone. Bladder: The urinary bladder is distended otherwise unremarkable Other: None. IMPRESSION: Echogenic kidneys in keeping with chronic kidney disease. No hydronephrosis or shadowing stone. Electronically Signed   By: Anner Crete M.D.   On: 09/28/2019 19:19   DG CHEST PORT 1 VIEW  Result Date: 09/27/2019 CLINICAL DATA:  Diabetic ketoacidosis.  Weakness. EXAM: PORTABLE CHEST 1 VIEW COMPARISON:  02/03/2019 FINDINGS: The cardiac silhouette, mediastinal and hilar contours are within normal limits and stable. Stable eventration of both hemidiaphragms, left greater than right with some overlying vascular crowding and streaky atelectasis. No infiltrates or effusions. No worrisome pulmonary lesions. The bony thorax is intact. IMPRESSION: No acute cardiopulmonary findings. Electronically Signed   By: Marijo Sanes M.D.   On:  09/27/2019 05:19   ECHOCARDIOGRAM COMPLETE  Result Date: 09/27/2019   ECHOCARDIOGRAM REPORT   Patient Name:   [name] Date of Exam: 09/27/2019 Medical Rec #:  [id number]           Height:       69.0 in Accession #:    [id number]          Weight:       240.3 lb Date of Birth:  April 22, 1971           BSA:          2.23  m Patient Age:    42 years            BP:           132/50 mmHg Patient Gender: M                   HR:           102 bpm. Exam Location:  Inpatient Procedure: 2D Echo STAT ECHO Indications:    elevated troponin  History:        Patient has prior history of Echocardiogram examinations, most                 recent 08/29/2018. TIA; Risk Factors:Hypertension, Diabetes and                 Dyslipidemia. Sinus tachycardia.  Sonographer:    Jannett Celestine RDCS (AE) Referring Phys: [id number] [place]  1. Left ventricular ejection fraction, by visual estimation, is 70 to 75%. The left ventricle has hyperdynamic function. There is no left ventricular hypertrophy.  2. The left ventricle has no regional wall motion abnormalities.  3. Global right ventricle has normal systolic function.The right ventricular size is normal. No increase in right ventricular wall thickness.  4. Left atrial size was normal.  5. Right atrial size was normal.  6. The mitral valve is normal in structure. No evidence of mitral valve regurgitation. No evidence of mitral stenosis.  7. The tricuspid valve is normal in structure.  8. The aortic valve is normal in structure. Aortic valve regurgitation is not visualized. No evidence of aortic valve sclerosis or stenosis.  9. The pulmonic valve was normal in structure. Pulmonic valve regurgitation is not visualized. 10. The inferior vena cava is dilated in size with <50% respiratory variability, suggesting right atrial pressure of 15 mmHg. 11. Prior images reviewed side by side. 12. Inferior vena cava is dilated, but otherwise no change from 04/10/2018. FINDINGS  Left  Ventricle: Left ventricular ejection fraction, by visual estimation, is 70 to 75%. The left ventricle has hyperdynamic function. The left ventricle has no regional wall motion abnormalities. There is no left ventricular hypertrophy. Left ventricular diastolic parameters were normal. Normal left atrial pressure. Right Ventricle: The right ventricular size is normal. No increase in right ventricular wall thickness. Global RV systolic function is has normal systolic function. Left Atrium: Left atrial size was normal in size. Right Atrium: Right atrial size was normal in size Pericardium: There is no evidence of pericardial effusion. Mitral Valve: The mitral valve is normal in structure. No evidence of mitral valve regurgitation. No evidence of mitral valve stenosis by observation. Tricuspid Valve: The tricuspid valve is normal in structure. Tricuspid valve regurgitation is not demonstrated. Aortic Valve: The aortic valve is normal in structure. Aortic valve regurgitation is not visualized. The aortic valve is structurally normal, with no evidence of sclerosis or stenosis. Pulmonic Valve: The pulmonic valve was normal in structure. Pulmonic valve regurgitation is not visualized. Pulmonic regurgitation is not visualized. Aorta: The aortic root, ascending aorta and aortic arch are all structurally normal, with no evidence of dilitation or obstruction. Venous: The inferior vena cava is dilated in size with less than 50% respiratory variability, suggesting right atrial pressure of 15 mmHg. IAS/Shunts: No atrial level shunt detected by color flow Doppler. There is no evidence of a patent foramen ovale. No ventricular septal defect is seen or detected. There is no evidence of an atrial septal defect.  LEFT VENTRICLE PLAX 2D LVIDd:  3.50 cm  Diastology LVIDs:         2.50 cm  LV e' lateral:   10.10 cm/s LV PW:         1.15 cm  LV E/e' lateral: 10.4 LV IVS:        1.10 cm  LV e' medial:    16.40 cm/s LVOT diam:     2.10  cm  LV E/e' medial:  6.4 LV SV:         29 ml LV SV Index:   12.17 LVOT Area:     3.46 cm  RIGHT VENTRICLE RV S prime:     12.50 cm/s TAPSE (M-mode): 1.6 cm LEFT ATRIUM         Index LA diam:    2.80 cm 1.25 cm/m  AORTIC VALVE LVOT Vmax:   54.80 cm/s LVOT Vmean:  41.800 cm/s LVOT VTI:    0.078 m  AORTA Ao Root diam: 2.90 cm MITRAL VALVE MV Area (PHT): 4.29 cm             SHUNTS MV PHT:        51.33 msec           Systemic VTI:  0.08 m MV Decel Time: 177 msec             Systemic Diam: 2.10 cm MV E velocity: 105.00 cm/s 103 cm/s  Mihai Croitoru MD Electronically signed by Sanda Klein MD Signature Date/Time: 09/27/2019/11:59:12 AM    Final     Microbiology: Recent Results (from the past 240 hour(s))  Respiratory Panel by RT PCR (Flu A&B, Covid) - Nasopharyngeal Swab     Status: None   Collection Time: 09/27/19  2:34 AM   Specimen: Nasopharyngeal Swab  Result Value Ref Range Status   SARS Coronavirus 2 by RT PCR NEGATIVE NEGATIVE Final    Comment: (NOTE) SARS-CoV-2 target nucleic acids are NOT DETECTED. The SARS-CoV-2 RNA is generally detectable in upper respiratoy specimens during the acute phase of infection. The lowest concentration of SARS-CoV-2 viral copies this assay can detect is 131 copies/mL. A negative result does not preclude SARS-Cov-2 infection and should not be used as the sole basis for treatment or other patient management decisions. A negative result may occur with  improper specimen collection/handling, submission of specimen other than nasopharyngeal swab, presence of viral mutation(s) within the areas targeted by this assay, and inadequate number of viral copies (<131 copies/mL). A negative result must be combined with clinical observations, patient history, and epidemiological information. The expected result is Negative. Fact Sheet for Patients:  PinkCheek.be Fact Sheet for Healthcare Providers:   GravelBags.it This test is not yet ap proved or cleared by the [place] FDA and  has been authorized for detection and/or diagnosis of SARS-CoV-2 by FDA under an Emergency Use Authorization (EUA). This EUA will remain  in effect (meaning this test can be used) for the duration of the COVID-19 declaration under Section 564(b)(1) of the Act, 21 U.S.C. section 360bbb-3(b)(1), unless the authorization is terminated or revoked sooner.    Influenza A by PCR NEGATIVE NEGATIVE Final   Influenza B by PCR NEGATIVE NEGATIVE Final    Comment: (NOTE) The Xpert Xpress SARS-CoV-2/FLU/RSV assay is intended as an aid in  the diagnosis of influenza from Nasopharyngeal swab specimens and  should not be used as a sole basis for treatment. Nasal washings and  aspirates are unacceptable for Xpert Xpress SARS-CoV-2/FLU/RSV  testing. Fact Sheet for Patients: PinkCheek.be Fact Sheet for  Healthcare Providers: GravelBags.it This test is not yet approved or cleared by the [place] and  has been authorized for detection and/or diagnosis of SARS-CoV-2 by  FDA under an Emergency Use Authorization (EUA). This EUA will remain  in effect (meaning this test can be used) for the duration of the  Covid-19 declaration under Section 564(b)(1) of the Act, 21  U.S.C. section 360bbb-3(b)(1), unless the authorization is  terminated or revoked. Performed at [facility], [place] [address], [place], [place] [address]   MRSA PCR Screening     Status: None   Collection Time: 09/27/19  5:35 AM   Specimen: Nasal Mucosa; Nasopharyngeal  Result Value Ref Range Status   MRSA by PCR NEGATIVE NEGATIVE Final    Comment:        The GeneXpert MRSA Assay (FDA approved for NASAL specimens only), is one component of a comprehensive MRSA colonization surveillance program. It is not intended to diagnose MRSA infection  nor to guide or monitor treatment for MRSA infections. Performed at [facility], [place] [address], [place], [place] [address]   Culture, blood (routine x 2)     Status: None   Collection Time: 09/27/19  8:42 AM   Specimen: BLOOD RIGHT HAND  Result Value Ref Range Status   Specimen Description   Final    BLOOD RIGHT HAND Performed at [facility] [address], [place], [place] [address]    Special Requests   Final    BOTTLES DRAWN AEROBIC ONLY Blood Culture results may not be optimal due to an inadequate volume of blood received in culture bottles Performed at [facility] [address], [place], [place] [address]    Culture   Final    NO GROWTH 5 DAYS Performed at [facility], [place] [address] [place], [place], [place] [address]    Report Status 10/02/2019 FINAL  Final  Culture, blood (routine x 2)     Status: None   Collection Time: 09/27/19  8:49 AM   Specimen: BLOOD LEFT HAND  Result Value Ref Range Status   Specimen Description   Final    BLOOD LEFT HAND Performed at [facility] [address] [place], [place] [address]    Special Requests   Final    BOTTLES DRAWN AEROBIC ONLY Blood Culture results may not be optimal due to an inadequate volume of blood received in culture bottles Performed at [facility] [address] [place], [place] [address]    Culture   Final    NO GROWTH 5 DAYS Performed at [facility], [place] [address], [place], [place] [address]    Report Status 10/02/2019 FINAL  Final     Labs: Basic Metabolic Panel: Recent Labs  Lab 09/29/19 0218 09/30/19 0248 10/01/19 0252 10/02/19 0548 10/03/19 0619  NA 137 135 134* 136 135  K 4.2 4.1 4.3 4.2 4.1  CL 103 107 103 106 105  CO2 25 23 23 27 24   GLUCOSE 115* 120* 293* 76 63*  BUN 47* 37* 35* 25* 19  CREATININE 3.51* 3.08* 2.96* 2.89* 2.67*  CALCIUM 8.3* 8.1* 8.3* 8.4* 8.2*   Liver Function  Tests: Recent Labs  Lab 10/03/19 0619  AST 27  ALT 27  ALKPHOS 67  BILITOT 0.6  PROT 5.3*  ALBUMIN 2.1*   Recent Labs  Lab 10/03/19 0619  LIPASE 22   No results for input(s): AMMONIA in the last  168 hours. CBC: Recent Labs  Lab 09/27/19 0200 09/28/19 0241 09/29/19 0218 09/30/19 0248 10/01/19 0252 10/03/19 0619  WBC 15.1* 15.2* 11.9* 8.8 7.6 7.9  NEUTROABS 11.4*  --   --   --   --   --   HGB 11.8* 12.3* 12.3* 11.3* 12.0* 10.9*  HCT 41.9 38.4* 38.1* 35.5* 37.7* 34.5*  MCV 102.2* 89.1 90.3 89.9 91.1 90.8  PLT 305 258 235 203 221 241   Cardiac Enzymes: No results for input(s): CKTOTAL, CKMB, CKMBINDEX, TROPONINI in the last 168 hours. BNP: BNP (last 3 results) No results for input(s): BNP in the last 8760 hours.  ProBNP (last 3 results) No results for input(s): PROBNP in the last 8760 hours.  CBG: Recent Labs  Lab 10/03/19 0804 10/03/19 0837 10/03/19 0931 10/03/19 1018 10/03/19 1153  GLUCAP 46* 132* 209* 200* 259*    Active Problems:   DKA, type 1 ([place])   Esophageal dysphagia   Duodenal ulcer   Ulcer of esophagus without bleeding   Time coordinating discharge: 38 minutes  Signed:        Mathhew Buysse, DO [facility]  10/03/2019, 2:29 PM

## 2019-10-03 NOTE — Anesthesia Preprocedure Evaluation (Signed)
Anesthesia Evaluation  Patient identified by MRN, date of birth, ID band Patient awake    Reviewed: Allergy & Precautions, NPO status , Patient's Chart, lab work & pertinent test results  Airway Mallampati: II  TM Distance: >3 FB     Dental   Pulmonary    breath sounds clear to auscultation       Cardiovascular hypertension,  Rhythm:Regular Rate:Normal     Neuro/Psych    GI/Hepatic History noted CG   Endo/Other  diabetes  Renal/GU Renal disease     Musculoskeletal   Abdominal   Peds  Hematology   Anesthesia Other Findings   Reproductive/Obstetrics                             Anesthesia Physical Anesthesia Plan  ASA: III  Anesthesia Plan: MAC   Post-op Pain Management:    Induction: Intravenous  PONV Risk Score and Plan: 1 and Propofol infusion  Airway Management Planned: Nasal Cannula and Simple Face Mask  Additional Equipment:   Intra-op Plan:   Post-operative Plan:   Informed Consent: I have reviewed the patients History and Physical, chart, labs and discussed the procedure including the risks, benefits and alternatives for the proposed anesthesia with the patient or authorized representative who has indicated his/her understanding and acceptance.     Dental advisory given  Plan Discussed with: CRNA and Anesthesiologist  Anesthesia Plan Comments:         Anesthesia Quick Evaluation

## 2019-10-04 ENCOUNTER — Other Ambulatory Visit: Payer: Self-pay

## 2019-10-04 ENCOUNTER — Encounter: Payer: Self-pay | Admitting: *Deleted

## 2019-10-04 ENCOUNTER — Other Ambulatory Visit: Payer: Self-pay | Admitting: *Deleted

## 2019-10-04 LAB — SURGICAL PATHOLOGY

## 2019-10-04 NOTE — Patient Outreach (Signed)
[facility]) Care Management  10/04/2019  LILTON PARE 03/23/1971 [id number]  Transition of care telephone call  Referral received:10/01/19 Initial outreach:10/04/19 Insurance: [facility]   Initial unsuccessful telephone call to patient's preferred number in order to complete transition of care assessment; no answer,unable to leave a message , as voicemail box is full   Objective: Per the electronic medical record, Ayodeji Keimig   was hospitalized at [facility]  From 12/24-12/30/20  With/for Diabetic Ketoacidosis, Esophageal dysphagia, duodenal and esophagus ulcer without bleeding.  . Comorbidities include: Diabetes type 1,prior use of insulin pump, OSA, Obesity,Chronic Kidney disease 4, diabetic neuropathy and retinopathy, hypertension, hyperlipidema  He was discharged to home on 10/03/19  without the need for home health services or durable medical equipment per the discharge summary.   Plan: This RNCM will route unsuccessful outreach letter with [facility] pamphlet and 24 hour Nurse Advice Line Magnet to [place] Management clinical pool to be mailed to patient's home address. This RNCM will attempt another outreach within 4 business days.  Joylene Draft, RN, [place] Management Coordinator  [phone]- Mobile [phone]- Toll Free Main Office

## 2019-10-09 ENCOUNTER — Other Ambulatory Visit: Payer: Self-pay | Admitting: *Deleted

## 2019-10-09 NOTE — Patient Outreach (Signed)
[facility]) Care Management  10/09/2019  [name] [birth year] [id number]   Transition of care call Referral received: 10/01/19 Initial outreach attempt: 10/04/19 Insurance: [employer] health [employer]    2nd unsuccessful telephone call to patient's preferred contact number in order to complete post hospital discharge transition of care assessment , no answer mailbox if full unable to leave a message.    Objective:  Per the electronic medical record, [name]   was hospitalized at [facility]  From 12/24-12/30/20  With/for Diabetic Ketoacidosis, Esophageal dysphagia, duodenal and esophagus ulcer without bleeding.  . Comorbidities include: Diabetes type 1,prior use of insulin pump, OSA, Obesity,Chronic Kidney disease 4, diabetic neuropathy and retinopathy, hypertension, hyperlipidema  He was discharged to home on 10/03/19  without the need for home health services or durable medical equipment per the discharge summary.     Plan If no return call from patient will attempt 3rd outreach in the next 4 business days.    Joylene Draft, RN, [place] Management Coordinator  [phone]- Mobile [phone]- Toll Free Main Office

## 2019-10-10 ENCOUNTER — Ambulatory Visit: Payer: 59 | Admitting: Physician Assistant

## 2019-10-10 ENCOUNTER — Other Ambulatory Visit: Payer: Self-pay

## 2019-10-10 ENCOUNTER — Encounter: Payer: Self-pay | Admitting: Physician Assistant

## 2019-10-10 VITALS — BP 120/70 | HR 83 | Ht 71.0 in | Wt 232.0 lb

## 2019-10-10 DIAGNOSIS — I10 Essential (primary) hypertension: Secondary | ICD-10-CM

## 2019-10-10 DIAGNOSIS — E785 Hyperlipidemia, unspecified: Secondary | ICD-10-CM | POA: Diagnosis not present

## 2019-10-10 DIAGNOSIS — R0602 Shortness of breath: Secondary | ICD-10-CM

## 2019-10-10 DIAGNOSIS — G459 Transient cerebral ischemic attack, unspecified: Secondary | ICD-10-CM | POA: Diagnosis not present

## 2019-10-10 DIAGNOSIS — R5383 Other fatigue: Secondary | ICD-10-CM | POA: Diagnosis not present

## 2019-10-10 DIAGNOSIS — M5020 Other cervical disc displacement, unspecified cervical region: Secondary | ICD-10-CM | POA: Diagnosis not present

## 2019-10-10 DIAGNOSIS — R600 Localized edema: Secondary | ICD-10-CM

## 2019-10-10 DIAGNOSIS — E1065 Type 1 diabetes mellitus with hyperglycemia: Secondary | ICD-10-CM

## 2019-10-10 DIAGNOSIS — R079 Chest pain, unspecified: Secondary | ICD-10-CM

## 2019-10-10 DIAGNOSIS — N1831 Chronic kidney disease, stage 3a: Secondary | ICD-10-CM

## 2019-10-10 NOTE — Progress Notes (Signed)
Office Visit    Patient Name: [name] Date of Encounter: 10/10/2019  Primary Care Provider:  [name], MD  Primary Cardiologist:  Donato Heinz, MD  Chief Complaint    49 year old male with history of HTN, TIA 09/2019, HLD, DM1 with DKA, CKD4, herniated cervical disk, OSA, and seen today for follow-up after a recent hospitalization at [facility] 2/2 DKA.    Past Medical History    Past Medical History:  Diagnosis Date  . CKD (chronic kidney disease), stage II   . Gastroparesis   . Herniated cervical disc   . HTN (hypertension)   . Hypercholesteremia   . S/P cardiac cath    a. 10-15 yrs ago at [facility] due to tachycardia, reportedly normal. b. Normal ETT 03/2012.  Marland Kitchen Sinus tachycardia    a. 24-hr Holter 03/2012 - SR, occ PVCs, no VT, avg HR 96bpm.  . TIA (transient ischemic attack)   . Type 1 diabetes mellitus ([place])    a. With insulin pump.   Past Surgical History:  Procedure Laterality Date  . BIOPSY  10/03/2019   Procedure: BIOPSY;  Surgeon: Mauri Pole, MD;  Location: [facility] ENDOSCOPY;  Service: Endoscopy;;  . cervical neck fusion    . ESOPHAGOGASTRODUODENOSCOPY (EGD) WITH PROPOFOL N/A 10/03/2019   Procedure: ESOPHAGOGASTRODUODENOSCOPY (EGD) WITH PROPOFOL;  Surgeon: Mauri Pole, MD;  Location: [facility] ENDOSCOPY;  Service: Endoscopy;  Laterality: N/A;  . HERNIA REPAIR     bilateral  . LEFT HEART CATHETERIZATION WITH CORONARY ANGIOGRAM N/A 12/18/2013   Procedure: LEFT HEART CATHETERIZATION WITH CORONARY ANGIOGRAM;  Surgeon: [name] M Martinique, MD;  Location: [facility] CATH LAB;  Service: Cardiovascular;  Laterality: N/A;    Allergies  Allergies  Allergen Reactions  . Oxycontin [Oxycodone Hcl] Nausea And Vomiting  . Penicillins Other (See Comments)    Possible reaction many years ago per patient  . Prednisone Other (See Comments)    Patient is diabetic, runs sugar up  . 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 He has a remote history of cardiac catheterization at [facility] (~2009) 2/2  ST that was without significant findings. 2013 Holter monitory showed PVCs and average HR of 96bpm. He was seen in 2013 by Dr. Johnsie Cancel for atypical chest pain with subsequent normal stress test. Echo showed normal LVSF with no significant valvular abnormalities. He was last seen by [facility] in our [place] office 03/2018, at which time he described sharp left sided chest discomfort lasting 15-20 seconds which happened at rest and not with physical activities. He also reported exertional dyspnea and leg edema worse on the L side.  While the CP was atypical in nature, ETT and echo was recommended due to the patient's multiple risk factors for CAD including a prolonged history of DM1. Subsequent echo showed nl EF 60-65% and no regional wall motion abnormalities. Exercise Myoview was without significant ischemia or scar and ruled a low risk study. It was noted that he had  A hypertensive BP response to exercise. He was then lost to follow-up.   On 09/28/2019, [facility] was consulted during his admission with DKA after  he had reportedly weaned himself off of his insulin pump a few months prior. He was hyperkalemic with EKG reflecting this with peaked T waves and resolving once started on IV insulin. HS Tn was significantly elevated at 10,640. TTE showed hyperdynamic systolic function with EF 70-75%, IVC dilated and non-collapsible and suggestive of elevated RA pressure. He denied any chest discomfort at the time of the consult. He also noted walking about 4-5 miles per day at work and without any chest discomfort. Given his lack of CP and echo as above, it was thought his HS Tn  elevation more consistent with supply demand ischemia in the setting of acute illness; however, underlying CAD could not be ruled out given his uncontrolled DM1 with A1C 10.2. Therefore, further ischemic evaluation was recommended as an outpatient with stress test once he recovered from his acute illness. Unfortunately, his kidney function precluded cardiac catheterization at that time. He was continued on heparin drip for 48 hours. Due to elevated pressure during the admission, he was also placed on Cardene drip.    Today, he presents to clinic and notes significant fatigue. He does not report any current CP but does continue to report intermittent atypical CP similar to that reported at previous clinic appointments. Specifically, he reports 2 recent CP episodes that were brief and lasted only seconds without clear triggers. The CP is atypical, left sided sharp chest pain. He reports finger numbness consistent with that of compressed C8 and that does not occur at the same time as his CP.  He also continues to note intermittent exertional dyspnea. He states today that he had asthma as an infant and wonders if this could be contributing. He also noted LEE with L>R. He denies any orthopnea or PND. No racing HR or palpitations. He occasionally feels dizzy but no syncope. He does report reduced appetite, occasional flushing, and chills / diaphoresis.  His main complaint is of fatigue and not feeling like himself. No s/sx of acute bleeding. He reports medication compliance.   Home Medications    Prior to Admission medications   Medication Sig Start Date End Date Taking? Authorizing Provider  amLODipine (NORVASC) 10 MG tablet Take 1 tablet (10 mg total) by mouth daily. 10/04/19  Yes Swayze, Ava, DO  atorvastatin (LIPITOR) 80 MG tablet Take 80 mg by mouth daily.   Yes [provider]  BAYER CONTOUR NEXT TEST test strip Inject 1 strip into the skin 4 (four) times daily as needed. 12/23/16  Yes [provider]  carvedilol (COREG) 12.5 MG tablet Take 1 tablet (12.5 mg total) by mouth 2 (two) times daily with a meal. 10/03/19  Yes Swayze, Ava, DO  HUMALOG 100 UNIT/ML injection Inject 100 Units into the skin daily. 11/11/16  Yes [provider]  hydrALAZINE (APRESOLINE) 50 MG tablet Take 1 tablet (50 mg total) by mouth every 8 (eight) hours. 10/03/19  Yes Swayze, Ava, DO  lidocaine (XYLOCAINE) 2 % solution Use as directed 15 mLs in the mouth or throat 4 (four) times daily as needed for mouth pain (indigestion). 10/03/19  Yes Swayze, Ava, DO  pantoprazole (PROTONIX) 40 MG tablet Take 1 tablet (40 mg total) by mouth daily. Patient taking differently: Take 40 mg by mouth as needed.  03/21/18 10/10/19 Yes Kasa, Maretta Bees, MD  sodium chloride (OCEAN) 0.65 % SOLN nasal spray Place 1 spray into both nostrils as needed for congestion. 10/03/19  Yes Swayze, Ava, DO  sucralfate (CARAFATE) 1 g tablet Take 1 tablet (1  g total) by mouth 4 (four) times daily -  with meals and at bedtime. 10/03/19  Yes Swayze, Ava, DO  HYDROcodone-acetaminophen (NORCO/VICODIN) 5-325 MG tablet Take 1 tablet by mouth every 6 (six) hours as needed for moderate pain. Patient not taking: Reported on 10/10/2019 09/21/19   Versie Starks, PA-C    Review of Systems    He denies racing HR/palpitations, pnd, orthopnea, syncope, weight gain. He reports ongoing atypical CP but no current CP, dyspnea / asthma, early satiety, intermittent dizziness, flushing, chills, diaphoresis, finger numbness consistent with C8 nerve root compression, and LEE with L>R. He also reports recent n/v with recent admission to [facility] for DKA.  All other systems reviewed and are otherwise negative except as noted above.  Physical Exam    VS:  BP 120/70 (BP Location: Left Arm, Patient Position: Sitting, Cuff Size: Normal)   Pulse 83   Ht 5\' 11"  (1.803 m)   Wt 232 lb (105.2 kg)   SpO2 98%   BMI 32.36 kg/m  , BMI Body mass index is 32.36 kg/m. GEN: Well  nourished, well developed, in no acute distress. HEENT: normal. Neck: Supple, no JVD, no b/l carotid bruits, or masses. L side of neck does appear edematous when visually compared with that of the left. Cardiac: RRR, no murmurs, rubs, or gallops. No clubbing, cyanosis. +1-2+ b/l LEE with L>R.  Radials/DP/PT 2+ and equal bilaterally.  Respiratory:  Respirations regular and unlabored, trace bilateral wheeze otherwise clear to auscultation bilaterally. GI: Soft, nontender, nondistended, BS + x 4. MS: no deformity or atrophy. B/l hand edema, L sided LEE>RLE, L side neck edema.  Skin: warm and dry, no rash. Neuro:  Strength and sensation are intact. Psych: Normal affect.  Accessory Clinical Findings    ECG personally reviewed by me today - NSR, 83bpm, PRi 115ms, QRS 90, LVH, poor R progression in V1-V2 - no acute changes.  Echo 09/27/2019  1. Left ventricular ejection fraction, by visual estimation, is 70 to 75%. The left ventricle has hyperdynamic function. There is no left ventricular hypertrophy.  2. The left ventricle has no regional wall motion abnormalities.  3. Global right ventricle has normal systolic function.The right ventricular size is normal. No increase in right ventricular wall thickness.  4. Left atrial size was normal.  5. Right atrial size was normal.  6. The mitral valve is normal in structure. No evidence of mitral valve regurgitation. No evidence of mitral stenosis.  7. The tricuspid valve is normal in structure.  8. The aortic valve is normal in structure. Aortic valve regurgitation is not visualized. No evidence of aortic valve sclerosis or stenosis.  9. The pulmonic valve was normal in structure. Pulmonic valve regurgitation is not visualized. 10. The inferior vena cava is dilated in size with <50% respiratory variability, suggesting right atrial pressure of 15 mmHg. 11. Prior images reviewed side by side. 12. Inferior vena cava is dilated, but otherwise no change from  04/10/2018.  NM Study  04/11/2018  Normal exercise myocardial perfusion stress test without significant ischemia or scar.  The left ventricular ejection fraction is normal by visual estimation and Siemens calculation (LVEF>65). LVEF by QGS is 46%.  Hypertensive blood pressure response to exercise.  This is a low risk study.  Filed Weights   10/10/19 0924  Weight: 232 lb (105.2 kg)     Assessment & Plan    Recent supply demand ischemia in the setting of DKA Atypical CP, DOE, Fatigue --No current CP. Does report  2 recent CP episodes as above in HPI. Also reports fatigue, diaphoresis, chills, and DOE. S/p [facility] hospitalization with 48h heparin. H/o remote cath and previous echo and stress tests without significant findings as above. Most recent echo as above with Nl EF and NRWMA. Most recent stress as above and low risk. He is eager to proceed with cardiac cath at this point; however, most recent labs continue to show Cr elevation above 2.0 that precludes cardiac catheterization / use of contrast. Will recheck renal function again today. As mentioned in HPI, he does have risk factors for underlying CAD, including poorly controlled DM1; therefore, given renal function precludes cath, will repeat NM Myoview stress testing. Given the ReDS vest reading today of 35%, will recheck an echo to reassess pressures. Given patient's increasing fatigue, will check CBC and TSH. Continue ASA, BB, and statin.   HTN with recent TIA --BP today well controlled with h/o poorly controlled BP. Continue current medications.  Bilateral LEE with L>R; B/l hand edema --LLE>RLE, which is reportedly an ongoing finding for him. No erythema, pain, or palpable cord on exam. Low suspicion for DVT at this time given patient is active and this is not a new finding. Considered LEE 2/2 current CKD, however. ReDS vest 35% today, suggestive of volume overload. Will recheck an echo to reassess heart pressures. Given most recent Cr on  labs, recheck renal function and defer start of diuresis. As below, consider referral to outpatient nephrology if Cr remains elevated and to help manage volume status.   HLD --Continue statin for aggressive risk factor modification. Will recheck lipid and liver function 6-8 weeks s/p start of statin.   AOCKD3 --Recheck renal function and electrolytes. As noted above, renal function precludes contrast use / cardiac catheterization at this time and given his Cr is still above 2.0. Consider CKD as contributing to edema and would defer diuresis until recheck renal function. Given diffuse edema and elevated Cr, consider outpatient referral to nephrology if Cr still remains elevated on updated BMET.   DM1 --Recommend glycemic control to prevent worsening renal function and for aggressive risk factor modification. Consider uncontrolled DM1 as contributing to appetite change as well as some of the symptoms above in HPI.  L sided neck edema --Check TSH. If abnormal, consider referral to endocrinology. He does report several symptoms consistent with that of thyroid dz and including fatigue, reduced appetite, and chills.   Finger numbness Herniated Cervical Disc --Consider finger numbness secondary to herniated cervical disc. Also considered is uncontrolled DM1.   Disposition: Obtain BMET, CBC, TSH. Update echo and NM Myoview. RTC in 2 weeks.    Arvil Chaco, PA-C 10/10/2019

## 2019-10-10 NOTE — Progress Notes (Unsigned)
   To whom it may concern:  Mr. Ghosh was seen today in clinic for hospital follow-up after his admission 09/27/2019-10/03/2019 at [facility]. Given his symptoms, he requires further cardiac workup at this time to ensure he is stable from a cardiac standpoint. Please allow for him to be excused from work until after these procedures. Please let me know if you have any questions or concerns.  Signed, Arvil Chaco, PA-C 10/10/2019, 10:36 AM Pager [phone]

## 2019-10-10 NOTE — Patient Instructions (Addendum)
Medication Instructions:   Your physician recommends that you continue on your current medications as directed. Please refer to the Current Medication list given to you today.  *If you need a refill on your cardiac medications before your next appointment, please call your pharmacy*  Lab Work:  Your physician recommends that you have lab work TODAY:  CMET, TSH, CBC   If you have labs (blood work) drawn today and your tests are completely normal, you will receive your results only by: Marland Kitchen MyChart Message (if you have MyChart) OR . A paper copy in the mail If you have any lab test that is abnormal or we need to change your treatment, we will call you to review the results.  Testing/Procedures:  1. Echocardiogram Please return to [facility] on ______________ at _______________ AM/PM for an Echocardiogram. Your physician has requested that you have an echocardiogram. Echocardiography is a painless test that uses sound waves to create images of your heart. It provides your doctor with information about the size and shape of your heart and how well your heart's chambers and valves are working. This procedure takes approximately one hour. There are no restrictions for this procedure. Please note; depending on visual quality an IV may need to be placed.   2. COVID19 Pre admit testing DRIVE THRU  Please report to the PAT testing site (medical arts building) on _____________________ From___12:30-2:30PM_____ time for your DRIVE THRU covid testing that is required prior to your procedure.  Following covid testing, please remain in quarantine. If you must be around others, please wash hands, avoid touching face and wear your mask.    3. [place]  Your caregiver has ordered a Stress Test with nuclear imaging. The purpose of this test is to evaluate the blood supply to your heart muscle. This procedure is referred to as a "Non-Invasive Stress Test." This is because other than having  an IV started in your vein, nothing is inserted or "invades" your body. Cardiac stress tests are done to find areas of poor blood flow to the heart by determining the extent of coronary artery disease (CAD). Some patients exercise on a treadmill, which naturally increases the blood flow to your heart, while others who are  unable to walk on a treadmill due to physical limitations have a pharmacologic/chemical stress agent called Lexiscan . This medicine will mimic walking on a treadmill by temporarily increasing your coronary blood flow.   Please note: these test may take anywhere between 2-4 hours to complete  PLEASE REPORT TO [place] AT THE FIRST DESK WILL DIRECT YOU WHERE TO GO  Date of Procedure:_____________________________________  Arrival Time for Procedure:______________________________  Instructions regarding medication:   _X___ : Hold diabetes medication morning of procedure  __X__:  Hold betablocker(s) night before procedure and morning of procedure- Coreg   ____:  Hold other medications as follows:_________________________________________________________________________________________________________________________________________________________________________________________________________________________________________________________________________________________  PLEASE NOTIFY THE OFFICE AT LEAST 24 HOURS IN ADVANCE IF YOU ARE UNABLE TO KEEP YOUR APPOINTMENT.  [phone] AND  PLEASE NOTIFY NUCLEAR MEDICINE AT [facility] AT LEAST 24 HOURS IN ADVANCE IF YOU ARE UNABLE TO KEEP YOUR APPOINTMENT. [phone]  How to prepare for your Myoview test:  1. Do not eat or drink after midnight 2. No caffeine for 24 hours prior to test 3. No smoking 24 hours prior to test. 4. Your medication may be taken with water.  If your doctor stopped a medication because of this test, do not take that medication. 5.  Ladies, please do not wear dresses.   Skirts or pants are appropriate. Please wear a short sleeve shirt. 6. No perfume, cologne or lotion. 7. Wear comfortable walking shoes. No heels!   Follow-Up: At [facility], you and your health needs are our priority.  As part of our continuing mission to provide you with exceptional heart care, we have created designated Provider Care Teams.  These Care Teams include your primary Cardiologist (physician) and Advanced Practice Providers (APPs -  Physician Assistants and Nurse Practitioners) who all work together to provide you with the care you need, when you need it.  Your next appointment:   2 week(s)  The format for your next appointment:   In Person  Provider:    You may see Nelva Bush, MD or one of the following Advanced Practice Providers on your designated Care Team:    Murray Hodgkins, NP  Christell Faith, PA-C  Marrianne Mood, PA-C

## 2019-10-11 ENCOUNTER — Telehealth: Payer: Self-pay | Admitting: Physician Assistant

## 2019-10-11 ENCOUNTER — Other Ambulatory Visit
Admission: RE | Admit: 2019-10-11 | Discharge: 2019-10-11 | Disposition: A | Discharge status: Home / Self Care | Payer: 59 | Source: Ambulatory Visit | Attending: Physician Assistant | Admitting: Physician Assistant

## 2019-10-11 DIAGNOSIS — Z20822 Contact with and (suspected) exposure to covid-19: Secondary | ICD-10-CM | POA: Diagnosis not present

## 2019-10-11 DIAGNOSIS — E039 Hypothyroidism, unspecified: Secondary | ICD-10-CM | POA: Diagnosis not present

## 2019-10-11 DIAGNOSIS — N1832 Chronic kidney disease, stage 3b: Secondary | ICD-10-CM | POA: Diagnosis not present

## 2019-10-11 DIAGNOSIS — Z01812 Encounter for preprocedural laboratory examination: Secondary | ICD-10-CM | POA: Insufficient documentation

## 2019-10-11 DIAGNOSIS — I10 Essential (primary) hypertension: Secondary | ICD-10-CM | POA: Diagnosis not present

## 2019-10-11 DIAGNOSIS — E1065 Type 1 diabetes mellitus with hyperglycemia: Secondary | ICD-10-CM | POA: Diagnosis not present

## 2019-10-11 LAB — TSH: TSH: 4.8 u[IU]/mL — ABNORMAL HIGH (ref 0.450–4.500)

## 2019-10-11 LAB — CBC
Hematocrit: 36.8 % — ABNORMAL LOW (ref 37.5–51.0)
Hemoglobin: 12.2 g/dL — ABNORMAL LOW (ref 13.0–17.7)
MCH: 29.3 pg (ref 26.6–33.0)
MCHC: 33.2 g/dL (ref 31.5–35.7)
MCV: 89 fL (ref 79–97)
Platelets: 592 10*3/uL — ABNORMAL HIGH (ref 150–450)
RBC: 4.16 x10E6/uL (ref 4.14–5.80)
RDW: 13.3 % (ref 11.6–15.4)
WBC: 9.7 10*3/uL (ref 3.4–10.8)

## 2019-10-11 LAB — COMPREHENSIVE METABOLIC PANEL
ALT: 31 IU/L (ref 0–44)
AST: 17 IU/L (ref 0–40)
Albumin/Globulin Ratio: 1.3 (ref 1.2–2.2)
Albumin: 3.3 g/dL — ABNORMAL LOW (ref 4.0–5.0)
Alkaline Phosphatase: 119 IU/L — ABNORMAL HIGH (ref 39–117)
BUN/Creatinine Ratio: 11 (ref 9–20)
BUN: 34 mg/dL — ABNORMAL HIGH (ref 6–24)
Bilirubin Total: 0.3 mg/dL (ref 0.0–1.2)
CO2: 21 mmol/L (ref 20–29)
Calcium: 9.7 mg/dL (ref 8.7–10.2)
Chloride: 98 mmol/L (ref 96–106)
Creatinine, Ser: 2.98 mg/dL — ABNORMAL HIGH (ref 0.76–1.27)
GFR calc Af Amer: 28 mL/min/{1.73_m2} — ABNORMAL LOW (ref >59)
GFR calc non Af Amer: 24 mL/min/{1.73_m2} — ABNORMAL LOW (ref >59)
Globulin, Total: 2.5 g/dL (ref 1.5–4.5)
Glucose: 225 mg/dL — ABNORMAL HIGH (ref 65–99)
Potassium: 5.4 mmol/L — ABNORMAL HIGH (ref 3.5–5.2)
Sodium: 131 mmol/L — ABNORMAL LOW (ref 134–144)
Total Protein: 5.8 g/dL — ABNORMAL LOW (ref 6.0–8.5)

## 2019-10-11 NOTE — Telephone Encounter (Signed)
2nd attempt to schedule and left vm to call back to schedule patient COVID test for nuclear stress test on Monday 1/11

## 2019-10-12 ENCOUNTER — Other Ambulatory Visit: Payer: Self-pay | Admitting: *Deleted

## 2019-10-12 ENCOUNTER — Telehealth: Payer: Self-pay | Admitting: *Deleted

## 2019-10-12 ENCOUNTER — Other Ambulatory Visit: Payer: 59

## 2019-10-12 LAB — SARS CORONAVIRUS 2 (TAT 6-24 HRS): SARS Coronavirus 2: NEGATIVE

## 2019-10-12 NOTE — Telephone Encounter (Signed)
No answer. Mailbox is full and cannot accept any messages at this time. Will try again later.

## 2019-10-12 NOTE — Patient Outreach (Signed)
[facility]) Care Management  10/12/2019  [name] 17-Dec-1970 [id number]   Transition of care call Referral received: 10/01/19 Initial outreach attempt: 10/04/19 Insurance: [place] unsuccessful telephone call to patient's preferred contact number in order to complete post hospital discharge transition of care assessment; no answer, unable to leave a message as mailbox is full.    Objective: Per the electronic medical record, [name]   was hospitalized at [facility]  From 12/24-12/30/20  With/for Diabetic Ketoacidosis, Esophageal dysphagia, duodenal and esophagus ulcer without bleeding.  . Comorbidities include: Diabetes type 1,prior use of insulin pump, OSA, Obesity,Chronic Kidney disease 4, diabetic neuropathy and retinopathy, hypertension, hyperlipidema  He was discharged to home on 10/03/19  without the need for home health services or durable medical equipment per the discharge summary.  Noted patient has attended PCP post discharge visit on 10/11/19.     Plan: If no return call from patient, will close case to [place] Management services in 10 business days after initial post hospital discharge outreach, 10/04/19   Joylene Draft, RN, [place] Management Coordinator  [phone]- Mobile [phone]- [place]

## 2019-10-12 NOTE — Telephone Encounter (Signed)
-----   Message from Arvil Chaco, PA-C sent at 10/11/2019 10:25 PM EST ----- Please let [name] know that his renal function has worsened from that of his labs taken during his hospitalization. I would recommend he be referred to outpatient nephrology at [place] at this time for further recommendations regarding his renal function and volume status. In addition, his glucose was elevated at 225, sodium low at 131, albumin low at 3.3, and potassium high at 5.4. His TSH is also elevated at 4.8; therefore, he will need a referral to outpatient endocrinology for further workup of his thyroid (full thyroid panel) and also for management of his insulin given his labs / poorly controlled Dm1. He is also still anemic with hemoglobin 12.2 but slightly improved from a week ago. His platelets are 592. If he is still feeling poorly, he should go to the ED, as I am concerned that he is approaching DKA again.

## 2019-10-12 NOTE — Telephone Encounter (Signed)
Patient calling back. We discussed results and he verbalized understanding. He saw PCP Dr Edwina Barth yesterday and has appointment with endocrinologist, Dr Gabriel Carina, on Tuesday. PCP drew labs yesterday as well and patient heard from PCP with similar recommendations as what we provided.  Patient was planning to call his nephrologist, Dr Holley Raring, today. He says he feels about the same with no energy. He feels his insulin pump may not be regulating just right because sugar was increased last night. This morning his blood sugar was 132 at last check. Patient verbalized understanding that if symptoms worsen, he should go to the ED or reach out to appropriate provider for advice as soon as possible. Routing to [employer], [place] to make her aware.

## 2019-10-12 NOTE — Telephone Encounter (Signed)
No answer with patient's number. Mailbox full and cannot accept messages at this time.  Attempted to reach patient's wife, ok per DPR. No answer. Left message to call back.

## 2019-10-15 ENCOUNTER — Other Ambulatory Visit: Payer: Self-pay | Admitting: *Deleted

## 2019-10-15 ENCOUNTER — Other Ambulatory Visit: Payer: Self-pay

## 2019-10-15 ENCOUNTER — Encounter
Admission: RE | Admit: 2019-10-15 | Discharge: 2019-10-15 | Disposition: A | Discharge status: Home / Self Care | Payer: 59 | Source: Ambulatory Visit | Attending: Physician Assistant | Admitting: Physician Assistant

## 2019-10-15 DIAGNOSIS — R0602 Shortness of breath: Secondary | ICD-10-CM | POA: Insufficient documentation

## 2019-10-15 LAB — NM MYOCAR MULTI W/SPECT W/WALL MOTION / EF
Estimated workload: 1 METS
Exercise duration (min): 0 min
Exercise duration (sec): 0 s
LV dias vol: 131 mL (ref 62–150)
LV sys vol: 55 mL
MPHR: 173 {beats}/min
Peak HR: 96 {beats}/min
Percent HR: 55 %
Rest HR: 86 {beats}/min
SDS: 4
SRS: 1
SSS: 5
TID: 0.85

## 2019-10-15 MED ORDER — REGADENOSON 0.4 MG/5ML IV SOLN
0.4000 mg | Freq: Once | INTRAVENOUS | Status: AC
  Administered 2019-10-15: 10:00:00 0.4 mg via INTRAVENOUS

## 2019-10-15 MED ORDER — TECHNETIUM TC 99M TETROFOSMIN IV KIT
29.7190 | PACK | Freq: Once | INTRAVENOUS | Status: AC | PRN
  Administered 2019-10-15: 10:00:00 29.719 via INTRAVENOUS

## 2019-10-15 MED ORDER — TECHNETIUM TC 99M TETROFOSMIN IV KIT
10.0000 | PACK | Freq: Once | INTRAVENOUS | Status: AC | PRN
  Administered 2019-10-15: 08:00:00 10.15 via INTRAVENOUS

## 2019-10-15 NOTE — Patient Outreach (Signed)
[facility] Care Management  10/15/2019  [name] 1970/10/14 [id number]   Transition of care case closure    Patient was hospitalized at [facility] 12/24-09/3019, he was discharged home on 10/03/19. He was enrolled to receive EMMI interactive voice response calls to assess for discharge needs.   Plan Will close to [facility] care management services at this time.    Joylene Draft, RN, [place] Management Coordinator  [phone]- Mobile [phone]- Toll Free Main Office

## 2019-10-16 ENCOUNTER — Encounter: Payer: Self-pay | Admitting: Physician Assistant

## 2019-10-16 ENCOUNTER — Encounter: Payer: Self-pay | Admitting: Gastroenterology

## 2019-10-16 ENCOUNTER — Telehealth: Payer: Self-pay | Admitting: Cardiovascular Disease

## 2019-10-16 DIAGNOSIS — E1021 Type 1 diabetes mellitus with diabetic nephropathy: Secondary | ICD-10-CM | POA: Diagnosis not present

## 2019-10-16 DIAGNOSIS — E1040 Type 1 diabetes mellitus with diabetic neuropathy, unspecified: Secondary | ICD-10-CM | POA: Diagnosis not present

## 2019-10-16 DIAGNOSIS — E103293 Type 1 diabetes mellitus with mild nonproliferative diabetic retinopathy without macular edema, bilateral: Secondary | ICD-10-CM | POA: Diagnosis not present

## 2019-10-16 DIAGNOSIS — E1022 Type 1 diabetes mellitus with diabetic chronic kidney disease: Secondary | ICD-10-CM | POA: Diagnosis not present

## 2019-10-16 NOTE — Telephone Encounter (Signed)
To Marrianne Mood, PA to review for return to work note.

## 2019-10-16 NOTE — Telephone Encounter (Signed)
Work note completed and sent via [place].

## 2019-10-16 NOTE — Telephone Encounter (Signed)
Patient needs a work note clearing him to return on 10/22/19. States after stress test Mickle Plumb would write note. Patient would like it sent to mychart if able or he can come to pick it up  Please advise when able

## 2019-10-25 ENCOUNTER — Ambulatory Visit (INDEPENDENT_AMBULATORY_CARE_PROVIDER_SITE_OTHER): Payer: 59

## 2019-10-25 ENCOUNTER — Other Ambulatory Visit: Payer: Self-pay

## 2019-10-25 DIAGNOSIS — R0602 Shortness of breath: Secondary | ICD-10-CM

## 2019-10-25 DIAGNOSIS — R079 Chest pain, unspecified: Secondary | ICD-10-CM

## 2019-10-29 DIAGNOSIS — E1065 Type 1 diabetes mellitus with hyperglycemia: Secondary | ICD-10-CM | POA: Diagnosis not present

## 2019-10-30 ENCOUNTER — Other Ambulatory Visit: Payer: Self-pay

## 2019-10-30 ENCOUNTER — Ambulatory Visit (INDEPENDENT_AMBULATORY_CARE_PROVIDER_SITE_OTHER): Payer: 59

## 2019-10-30 ENCOUNTER — Ambulatory Visit: Payer: 59 | Admitting: Physician Assistant

## 2019-10-30 ENCOUNTER — Encounter: Payer: Self-pay | Admitting: Physician Assistant

## 2019-10-30 ENCOUNTER — Ambulatory Visit (INDEPENDENT_AMBULATORY_CARE_PROVIDER_SITE_OTHER): Payer: 59 | Admitting: Physician Assistant

## 2019-10-30 VITALS — BP 160/78 | HR 77 | Ht 72.0 in | Wt 240.2 lb

## 2019-10-30 DIAGNOSIS — R0609 Other forms of dyspnea: Secondary | ICD-10-CM

## 2019-10-30 DIAGNOSIS — M5020 Other cervical disc displacement, unspecified cervical region: Secondary | ICD-10-CM

## 2019-10-30 DIAGNOSIS — R000 Tachycardia, unspecified: Secondary | ICD-10-CM

## 2019-10-30 DIAGNOSIS — R5383 Other fatigue: Secondary | ICD-10-CM

## 2019-10-30 DIAGNOSIS — N1831 Chronic kidney disease, stage 3a: Secondary | ICD-10-CM

## 2019-10-30 DIAGNOSIS — Z8673 Personal history of transient ischemic attack (TIA), and cerebral infarction without residual deficits: Secondary | ICD-10-CM | POA: Diagnosis not present

## 2019-10-30 DIAGNOSIS — I252 Old myocardial infarction: Secondary | ICD-10-CM | POA: Diagnosis not present

## 2019-10-30 DIAGNOSIS — E1065 Type 1 diabetes mellitus with hyperglycemia: Secondary | ICD-10-CM | POA: Diagnosis not present

## 2019-10-30 DIAGNOSIS — R002 Palpitations: Secondary | ICD-10-CM

## 2019-10-30 DIAGNOSIS — I10 Essential (primary) hypertension: Secondary | ICD-10-CM

## 2019-10-30 DIAGNOSIS — E785 Hyperlipidemia, unspecified: Secondary | ICD-10-CM

## 2019-10-30 DIAGNOSIS — D638 Anemia in other chronic diseases classified elsewhere: Secondary | ICD-10-CM

## 2019-10-30 DIAGNOSIS — R0600 Dyspnea, unspecified: Secondary | ICD-10-CM

## 2019-10-30 DIAGNOSIS — R7989 Other specified abnormal findings of blood chemistry: Secondary | ICD-10-CM

## 2019-10-30 MED ORDER — HYDRALAZINE HCL 50 MG PO TABS: 25.0000 mg | ORAL_TABLET | Freq: Three times a day (TID) | ORAL | 3 refills | Status: DC

## 2019-10-30 NOTE — Patient Instructions (Signed)
Medication Instructions:  1- DECREASE Hydralazine to Take 0.5 tablets (25 mg total) by mouth 3 (three) times daily *If you need a refill on your cardiac medications before your next appointment, please call your pharmacy*  Lab Work: 1- Your physician recommends that you return for lab work in: 1 week at the medical mall. (Mag, [facility])No appt is needed. Hours are M-F 7AM- 6 PM.  If you have labs (blood work) drawn today and your tests are completely normal, you will receive your results only by: Marland Kitchen MyChart Message (if you have MyChart) OR . A paper copy in the mail If you have any lab test that is abnormal or we need to change your treatment, we will call you to review the results.  Testing/Procedures: 1- A zio monitor was placed today. It will remain on for 14 days. You will then return monitor and event diary in provided box. It takes 1-2 weeks for report to be downloaded and returned to [place]. We will call you with the results. If monitor falls of or has orange flashing light, please call Zio for further instructions.   2-  Zio XT: We will place order and you will receive it in the mail.  You may get a call from [place] @ either  [phone] Or  [phone] for them to confirm your address before it will be sent to you.  You will wear the monitor for 14 days, remove it and send it back to the company. They will send [place] a report. Then we will call you with the results.   Follow-Up: At [facility], you and your health needs are our priority.  As part of our continuing mission to provide you with exceptional heart care, we have created designated Provider Care Teams.  These Care Teams include your primary Cardiologist (physician) and Advanced Practice Providers (APPs -  Physician Assistants and Nurse Practitioners) who all work together to provide you with the care you need, when you need it.  Your next appointment:   2-3  month(s)  The format for your next appointment:   In  Person  Provider:    Please establish primary cardiologist at your next visit.

## 2019-10-30 NOTE — Progress Notes (Signed)
Elenor Quinones, PA

## 2019-10-30 NOTE — Progress Notes (Signed)
Office Visit    Patient Name: [name] Date of Encounter: 10/30/2019  Primary Care Provider:  [name], MD Primary Cardiologist:  Donato Heinz, MD  Chief Complaint    49 year old male with history of HTN, TIA 09/2019, sinus tachycardia, HLD, DM1 with DKA on insulin pump, CKD4, herniated cervical disk, OSA (2018), abnormal TSH, and seen today for follow-up of recent fatigue and DOE s/p stress test and echo.  Past Medical History    Past Medical History:  Diagnosis Date  . CKD (chronic kidney disease), stage II   . Gastroparesis   . Herniated cervical disc   . HTN (hypertension)   . Hypercholesteremia   . S/P cardiac cath    a. 10-15 yrs ago at [facility] due to tachycardia, reportedly normal. b. Normal ETT 03/2012.  Marland Kitchen Sinus tachycardia    a. 24-hr Holter 03/2012 - SR, occ PVCs, no VT, avg HR 96bpm.  . TIA (transient ischemic attack)   . Type 1 diabetes mellitus ([place])    a. With insulin pump.   Past Surgical History:  Procedure Laterality Date  . BIOPSY  10/03/2019   Procedure: BIOPSY;  Surgeon: Mauri Pole, MD;  Location: [facility];  Service: Endoscopy;;  . cervical neck fusion    . ESOPHAGOGASTRODUODENOSCOPY (EGD) WITH PROPOFOL N/A 10/03/2019   Procedure: ESOPHAGOGASTRODUODENOSCOPY (EGD) WITH PROPOFOL;  Surgeon: Mauri Pole, MD;  Location: [facility];  Service: Endoscopy;  Laterality: N/A;  . HERNIA REPAIR     bilateral  . LEFT HEART CATHETERIZATION WITH CORONARY ANGIOGRAM N/A 12/18/2013   Procedure: LEFT HEART CATHETERIZATION WITH CORONARY ANGIOGRAM;  Surgeon: [name] M Martinique, MD;  Location: [facility] CATH LAB;  Service: Cardiovascular;  Laterality: N/A;    Allergies  Allergies  Allergen Reactions  . Oxycontin [Oxycodone Hcl] Nausea And Vomiting  . Penicillins Other (See Comments)    Possible reaction many years ago per patient  . Prednisone Other (See Comments)    Patient is diabetic, runs sugar up  . 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  Of note, he has noted walking about 4-5 miles per day at work and without any chest discomfort before admission. He has a remote history of cardiac catheterization at [facility] (~2009) 2/2  ST that was without significant findings. 2013 Holter monitory showed PVCs and average HR of 96bpm. He was seen in 2013 by Dr. Johnsie Cancel for atypical chest pain with subsequent normal stress test. Echo showed normal LVSF with no significant valvular abnormalities. He was last seen by [facility] in our [place] office 03/2018, at which time he described sharp left sided chest discomfort lasting 15-20 seconds which happened at rest and not with physical activities. He also reported exertional dyspnea and leg edema worse on the L side.  While the CP was atypical in nature, ETT and echo was recommended due to the patient's multiple risk factors for CAD including a prolonged history of DM1. Subsequent echo showed nl EF 60-65% and no regional wall motion abnormalities. Exercise Myoview was without significant ischemia or scar and ruled a low risk study.  It was noted that he had  A hypertensive BP response to exercise. He was then lost to follow-up.   On 09/28/2019, [facility] was consulted during his admission with DKA s/p decreased insulin pump use. He was hyperkalemic with EKG reflecting this with peaked T waves and resolution on IV insulin. HS Tn was significantly elevated at 10,640. TTE showed hyperdynamic systolic function with EF 70-75%, IVC dilated and non-collapsible and suggestive of elevated RA pressure. Given his lack of CP, it was thought his HS Tn elevation more consistent  with supply demand ischemia in the setting of acute illness; however, underlying CAD could not be ruled out given his uncontrolled DM1 with A1C 10.2. Further ischemic evaluation recommended as an outpatient with stress test as kidney function precluded cardiac catheterization.  He was seen at clinic 10/10/2019 and noted significant fatigue and continued intermittent atypical CP, similar to that of previous visits. He had intermittent DOE and LEE with L>R.  He occasionally felt dizzy but denied syncope. He reported reduced appetite, occasional flushing, and chills / diaphoresis. Asymmetric neck swelling was noted on exam. TSH was therefore checked and abnormal at 4.800 with referral recommendation to endocrinology for further workup and treatment. CMET lab showed findings concerning for borderline / near DKA with recommendation to present to the ED if sx and f/u with endocrinology for tighter glycemic control. Since that time, he reports f/u with endocrinology. His insulin pump reportedly has a sensor defect, which he feels may be contributing to his trouble controlling his sugars. He underwent Lexiscan, ruled low risk but with hypertensive BP response noted to activity, and echo with nl high EF and blood pressure control recommended.  Today, he reports that he has returned to work and is attempting to take it easy. He reports he fatigues eagerly but is agreeable to increase activity as tolerated on discussion today. He denies any further atypical CP. He does recall his history of palpitations and elevated HR at times. No recent presyncope or syncope. He continues to note fatigue and dyspnea. He has noticed that his L neck swelling comes and goes without any clear triggers or alleviating factors. He reports improved LEE and resolution of previous asymmetric LEE. He states today that he intends to follow-up with nephrology given his CKD, as he knows this may be contributing to his sx. He denies any orthopnea, PND, or  early satiety today. Clinic weight today is up 8 lbs from his previous clinic weight on 1/6. He states his PCP stopped his hydralazine 2/2 concern for lower BP but denies any presyncope/syncope or sx with lower BP. On review of PCP note, BP 110/66    BP 160/78 in clinic today. Previous visit BP on 10/09/18 in clinic with BP 120/70. It also was noted that Coreg has been decreased between visits from BID to qd dosing frequency. No s/sx of acute bleeding reported. He continues to try and control his sugars.  Home Medications    Prior to Admission medications   Medication Sig Start Date End Date Taking? Authorizing Provider  amLODipine (NORVASC) 10 MG tablet Take 1 tablet (10 mg total) by mouth daily. 10/04/19  Yes Swayze, Ava, DO  atorvastatin (LIPITOR) 80 MG tablet Take 80 mg by mouth daily.   Yes [provider]  BAYER CONTOUR NEXT TEST test strip Inject 1 strip into the skin 4 (four) times daily as needed. 12/23/16  Yes [provider]  carvedilol (COREG) 12.5 MG tablet Take 1 tablet (12.5 mg total) by  mouth 2 (two) times daily with a meal. Patient taking differently: Take 12.5 mg by mouth daily.  10/03/19  Yes Swayze, Ava, DO  HUMALOG 100 UNIT/ML injection Inject 100 Units into the skin daily. 11/11/16  Yes [provider]  HYDROcodone-acetaminophen (NORCO/VICODIN) 5-325 MG tablet Take 1 tablet by mouth every 6 (six) hours as needed for moderate pain. 09/21/19  Yes Fisher, Linden Dolin, PA-C  lidocaine (XYLOCAINE) 2 % solution Use as directed 15 mLs in the mouth or throat 4 (four) times daily as needed for mouth pain (indigestion). 10/03/19  Yes Swayze, Ava, DO  pantoprazole (PROTONIX) 40 MG tablet Take 1 tablet (40 mg total) by mouth daily. Patient taking differently: Take 40 mg by mouth as needed.  03/21/18 10/30/19 Yes Kasa, Maretta Bees, MD  sodium chloride (OCEAN) 0.65 % SOLN nasal spray Place 1 spray into both nostrils as needed for congestion. 10/03/19  Yes Swayze, Ava, DO   hydrALAZINE (APRESOLINE) 50 MG tablet Take 0.5 tablets (25 mg total) by mouth 3 (three) times daily. 10/30/19   Marrianne Mood D, PA-C    Review of Systems    He denies chest pain, pnd, orthopnea, n, v, presyncope, syncope,  or early satiety. He reports occasional palpitations, racing HR, and DOE. He notes improved LEE when compared with last visit.  He reports significant fatigue. Clinic weight shows increase from 232lb to 240lb today. All other systems reviewed and are otherwise negative except as noted above.  Physical Exam    VS:  BP (!) 160/78 (BP Location: Left Arm, Patient Position: Sitting, Cuff Size: Normal)   Pulse 77   Ht 6' (1.829 m)   Wt 240 lb 4 oz (109 kg)   SpO2 99%   BMI 32.58 kg/m  , BMI Body mass index is 32.58 kg/m. GEN: Well nourished, well developed, in no acute distress. Appears fatigued.  HEENT: normal. Neck: Supple, no JVD, carotid bruits, or masses. L sided neck swelling resolved. No palpable supraclavicular lymph or cervical lymph noted. Cardiac: RRR, no murmurs, rubs, or gallops. No clubbing, cyanosis. Moderate b/l edema.  Radials/DP/PT 2+ and equal bilaterally.  Respiratory:  Respirations regular and unlabored, clear to auscultation bilaterally. GI: Soft, nontender, nondistended, BS + x 4. MS: no deformity or atrophy. Skin: warm and dry, no rash. Neuro:  Strength and sensation are intact. Psych: Normal affect.  Accessory Clinical Findings    ECG personally reviewed by me today - NSR, 77bpm, PRi 137m, QTc 448 - no acute changes.  Echo 10/25/2019  1. Left ventricular ejection fraction, by visual estimation, is 60 to 65%. The left ventricle has normal function. There is no left ventricular hypertrophy.  2. Left ventricular diastolic parameters are consistent with Grade I diastolic dysfunction (impaired relaxation).  3. The left ventricle has no regional wall motion abnormalities.  4. Global right ventricle has normal systolic function.The right  ventricular size is normal. No increase in right ventricular wall thickness.  5. Left atrial size was normal.  NM Study  There was no ST segment deviation noted during stress.  No T wave inversion was noted during stress.  The study is normal.  This is a low risk study.  The left ventricular ejection fraction is normal (55-65%).  Suboptimal study due to GI uptake.   Previous labs reviewed before visit and at time of visit with patient.  10/10/19 visit Sodium 131, potassium 5.4, glucose 225, creatinine 2.98, BUN 34, albumin 3.3, alk phos 119, AST 17, ALT 31, WBC 9.7, hemoglobin 12.2, hematocrit  36.8, platelets 592, TSH 4.800  CareEverywhere: 01/2019 LDL 195, Tg 148, total cholesterol 288, HDL 63.8  Filed Weights   10/30/19 1410  Weight: 240 lb 4 oz (109 kg)     Assessment & Plan    Fatigue, DOE  Intermittent atypical CP --Continues to report fatigue and DOE. No further intermittent CP with most recent cardiac workup unrevealing as above. Underwent remote cath without significant findings. 10/2018 Echo nl EF and Lexiscan ruled low risk. Suspect fatigue and DOE likely multifactorial in the setting of DM1/hyperglycemia, poorly controlled BP, abnormal TSH, CKD, and deconditioning with recent hospitalization. Continue to work on increasing activity as tolerated and lifestyle changes. Recommend close follow-up with endocrinology regarding previous abnormal TSH and DM1, as well as nephrology for CKD. BP control recommended with restart of medications discontinued last visit as outlined below. Given his h/o elevated rates and palpitations, will place Zio as well to rule out sx 2/2 arrhythmia etiology given abnormal TSH and frequent electrolyte abnormalities in the setting of DKA. CPAP use ([facility], 2018) and lifestyle diet and activity changes encouraged.   Hypertension, poorly controlled, with history of TIA  -- Previous clinic BP 120/70; however, between visits, hydralazine was discontinued and  Coreg now taken at qd vs BID frequency. BP today 160/78 with subsequent review of recommendation for BP at or below 120/60 to prevent hypertensive heart dz and worsening renal function. Also noted is h/o TIA in setting of elevated pressures. Agreeable to stepwise up-titration. Start hydralazine 50m TID, and if tolerated, escalate back to hydralazine 550mTID. Reassess Coreg frequency of dosing (back up to BID) at follow-up for additional HR and BP support. Low salt diet, CPAP use, and increased activity recommended.  Palpitations, H/o Sinus Tachycardia  --Notes occasional to rare palpitations / intermittent faster rates on exam today and interest for further workup to exclude arrhythmia as contributing to sx of ongoing fatigue and DOE with known OSA, abnormal TSH, and abnormal electrolytes 2/2 DKA. Placing 1 mo. Zio monitor given frequency of palpitations reported to rule out the presence of arrhythmia not yet captured on previous EKGs or telemetry. Given decrease of Coreg to qd since last visit, reassess going back up to BID dosing at follow-up or once Zio resulted.   Abnormal TSH --Reports that endocrinology is in the process of evaluating his abnormal TSH; however, I am unable to find corresponding documentation for this on review of EMR. Endocrinology continues to follow for DM1. Per 1/7 PCP note, recheck of TSH 1 day after our clinic TSH resulted at 4.800 showed TSH 3.131. Consider thyroid panel for further evaluation given ongoing sx with abnormal values at last visit and (per review of CareEverywhere labs) back in 01/2019. Will defer to PCP and endocrinology.  DM1 with recent DKA and supply demand ischemia --Recommend close follow-up with endocrinology and tight glycemic control to prevent worsening renal function and aggressive cardiac risk factor reduction. 10/2019 A1C 10.4.   Lower extremity edema, improved --Improved from previous visit. Euvolemic on exam with stable DOE and EF nl. Considered  is LEE 2/2 CKD with patient reporting today that he will follow-up with nephrology. Also of note, clinic weight up from previous visit with patient euvolemic on exam and suspect at least in part due to increased sedentary lifestyle with activity encouraged as tolerated.   Hyperlipidemia --Continue statin with recheck of lipids at next RTC or per PCP. Previous 08/2018  01/2019 labs with LDL 190  195; total cholesterol 257  288; HDL 43  63.8, TG 119  148.  1/6 LFTs stable.   Acute on chronic kidney disease stage III --Follow-up with nephrology as discussed in clinic. Continue to monitor sugars and renal function / electrolytes. Strict BP and glycemic control recommended to control worsening renal function. Avoid nephrotoxins.    Thrombocytosis --Recommend continue to monitor with CBC per PCP.   Disposition: Restart antihypertensive regimen with BMET / Mg now and in 1 week. 1 mo Zio given infrequency of palpitations. Follow-up with nephrology, PCP, and endocrinology.  Arvil Chaco, PA-C 10/30/2019, 3:14 PM

## 2019-11-13 ENCOUNTER — Ambulatory Visit (INDEPENDENT_AMBULATORY_CARE_PROVIDER_SITE_OTHER): Payer: 59

## 2019-11-13 DIAGNOSIS — R002 Palpitations: Secondary | ICD-10-CM

## 2019-11-14 ENCOUNTER — Other Ambulatory Visit: Payer: Self-pay | Admitting: Internal Medicine

## 2019-11-14 DIAGNOSIS — E103293 Type 1 diabetes mellitus with mild nonproliferative diabetic retinopathy without macular edema, bilateral: Secondary | ICD-10-CM | POA: Diagnosis not present

## 2019-11-14 DIAGNOSIS — E1040 Type 1 diabetes mellitus with diabetic neuropathy, unspecified: Secondary | ICD-10-CM | POA: Diagnosis not present

## 2019-11-14 DIAGNOSIS — E1021 Type 1 diabetes mellitus with diabetic nephropathy: Secondary | ICD-10-CM | POA: Diagnosis not present

## 2019-11-14 DIAGNOSIS — E1022 Type 1 diabetes mellitus with diabetic chronic kidney disease: Secondary | ICD-10-CM | POA: Diagnosis not present

## 2019-11-22 DIAGNOSIS — R002 Palpitations: Secondary | ICD-10-CM | POA: Diagnosis not present

## 2019-11-23 ENCOUNTER — Other Ambulatory Visit: Payer: Self-pay

## 2019-11-23 ENCOUNTER — Ambulatory Visit (INDEPENDENT_AMBULATORY_CARE_PROVIDER_SITE_OTHER): Payer: 59 | Admitting: Podiatry

## 2019-11-23 DIAGNOSIS — L97512 Non-pressure chronic ulcer of other part of right foot with fat layer exposed: Secondary | ICD-10-CM | POA: Diagnosis not present

## 2019-11-23 MED ORDER — GENTAMICIN SULFATE 0.1 % EX CREA: 1.0000 "application " | TOPICAL_CREAM | Freq: Two times a day (BID) | CUTANEOUS | 1 refills | Status: DC

## 2019-11-26 NOTE — Progress Notes (Signed)
   Subjective:  49 y.o. male with PMHx of diabetes mellitus presenting today with a chief complaint of a nonpainful blister to the lateral right hallux that appeared 3-4 days ago. He states the wound started out as a blister and then he "popped it". He denies any worsening factors. He has been soaking the toe in Epsom salt, applying Silvadene cream and using antibiotic cream. Patient is here for further evaluation and treatment.  Past Medical History:  Diagnosis Date  . CKD (chronic kidney disease), stage II   . Gastroparesis   . Herniated cervical disc   . HTN (hypertension)   . Hypercholesteremia   . S/P cardiac cath    a. 10-15 yrs ago at [facility] due to tachycardia, reportedly normal. b. Normal ETT 03/2012.  Marland Kitchen Sinus tachycardia    a. 24-hr Holter 03/2012 - SR, occ PVCs, no VT, avg HR 96bpm.  . TIA (transient ischemic attack)   . Type 1 diabetes mellitus ([place])    a. With insulin pump.      Objective/Physical Exam General: The patient is alert and oriented x3 in no acute distress.  Dermatology:  Wound #1 noted to the right hallux measuring approximately 1.0 x 1.0 x 0.1 cm (LxWxD).   To the noted ulceration(s), there is no eschar. There is a moderate amount of slough, fibrin, and necrotic tissue noted. Granulation tissue and wound base is red. There is a minimal amount of serosanguineous drainage noted. There is no exposed bone muscle-tendon ligament or joint. There is no malodor. Periwound integrity is intact. Skin is warm, dry and supple bilateral lower extremities.  Vascular: Palpable pedal pulses bilaterally. No edema or erythema noted. Capillary refill within normal limits.  Neurological: Epicritic and protective threshold diminished bilaterally.   Musculoskeletal Exam: Range of motion within normal limits to all pedal and ankle joints bilateral. Muscle strength 5/5 in all groups bilateral.   Assessment: 1. 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 diabetes  mellitus w/ peripheral neuropathy   Plan of Care:  1. Patient was evaluated. 2. medically necessary excisional debridement including subcutaneous tissue was performed using a tissue nipper and a chisel blade. Excisional debridement of all the necrotic nonviable tissue down to healthy bleeding viable tissue was performed with post-debridement measurements same as pre-. 3. the wound was cleansed and dry sterile dressing applied. 4. Prescription for Gentamicin cream provided to patient to use daily with a bandage.  5. Recommended changed shoes. Ulceration appears to have been caused by a new pair of work shoes.  6. patient is to return to clinic in 3 weeks.   Edrick Kins, DPM [facility]  Dr. Edrick Kins, [address]                                        [place],  [address]                Office [phone]  Fax [phone]

## 2019-11-29 DIAGNOSIS — R002 Palpitations: Secondary | ICD-10-CM | POA: Diagnosis not present

## 2019-12-03 ENCOUNTER — Other Ambulatory Visit: Payer: Self-pay

## 2019-12-04 ENCOUNTER — Telehealth: Payer: Self-pay | Admitting: *Deleted

## 2019-12-04 NOTE — Telephone Encounter (Signed)
No answer/Voicemail box is full. Patient has not seen My Chart results at this time.

## 2019-12-04 NOTE — Telephone Encounter (Signed)
-----   Message from Arvil Chaco, PA-C sent at 11/29/2019  9:54 PM EST ----- Good news! Please let [name] know that his cardiac monitor showed predominantly NSR with an average rate of 89bpm. He had rare extra beats from the top part of his heart (PACs), some of which occurred twice in a row. He also had rare extra beats from the bottom part of his heart (PVCs, 118 total counted), some of which occurred 2 (3 counted) or 3 times (1 counted)  in a row. Overall, very reassuring results!

## 2019-12-07 ENCOUNTER — Ambulatory Visit: Payer: 59 | Admitting: Podiatry

## 2019-12-07 NOTE — Telephone Encounter (Signed)
No answer. No voicemail. 

## 2019-12-11 NOTE — Telephone Encounter (Signed)
Left voicemail message to call back for monitor results.

## 2019-12-12 ENCOUNTER — Encounter: Payer: Self-pay | Admitting: *Deleted

## 2019-12-12 NOTE — Telephone Encounter (Signed)
Multiple attempts to reach patient to review results and not available. Sent letter to patient with results and our number to call if any questions.

## 2019-12-17 ENCOUNTER — Telehealth: Payer: Self-pay

## 2019-12-17 NOTE — Telephone Encounter (Signed)
-----   Message from Arvil Chaco, PA-C sent at 12/17/2019  6:41 AM EDT ----- Please let [name] know that his monitor showed his predominant rhythm as normal sinus rhythm with an average heart rate of 86bpm. He had 1 run of a faster heart beat that originated from the top part of his heart and lasted for 7 beats max with a maximum rate of 128 bpm and an average rate of 122 bpm. He had rare extra beats from the bottom and top of  his heart (PACs, PVCs). His triggered events did not correspond with any significant arrhythmia.

## 2019-12-17 NOTE — Telephone Encounter (Signed)
Left message on patients voicemail to please return our call.   

## 2019-12-24 NOTE — Telephone Encounter (Signed)
Letter was sent out to patient with results and request for him to please call if any further questions.

## 2020-01-18 DIAGNOSIS — E1065 Type 1 diabetes mellitus with hyperglycemia: Secondary | ICD-10-CM | POA: Diagnosis not present

## 2020-01-22 DIAGNOSIS — E1065 Type 1 diabetes mellitus with hyperglycemia: Secondary | ICD-10-CM | POA: Diagnosis not present

## 2020-01-22 DIAGNOSIS — N1832 Chronic kidney disease, stage 3b: Secondary | ICD-10-CM | POA: Diagnosis not present

## 2020-01-22 DIAGNOSIS — I10 Essential (primary) hypertension: Secondary | ICD-10-CM | POA: Diagnosis not present

## 2020-01-23 ENCOUNTER — Other Ambulatory Visit: Payer: Self-pay

## 2020-01-23 ENCOUNTER — Inpatient Hospital Stay
Admission: EM | Admit: 2020-01-23 | Discharge: 2020-01-28 | DRG: 638 | Disposition: A | Discharge status: Home / Self Care | Payer: 59 | Attending: Student | Admitting: Student

## 2020-01-23 DIAGNOSIS — J449 Chronic obstructive pulmonary disease, unspecified: Secondary | ICD-10-CM | POA: Diagnosis not present

## 2020-01-23 DIAGNOSIS — E1010 Type 1 diabetes mellitus with ketoacidosis without coma: Secondary | ICD-10-CM

## 2020-01-23 DIAGNOSIS — E109 Type 1 diabetes mellitus without complications: Secondary | ICD-10-CM | POA: Diagnosis present

## 2020-01-23 DIAGNOSIS — E870 Hyperosmolality and hypernatremia: Secondary | ICD-10-CM | POA: Diagnosis present

## 2020-01-23 DIAGNOSIS — E860 Dehydration: Secondary | ICD-10-CM | POA: Diagnosis present

## 2020-01-23 DIAGNOSIS — E669 Obesity, unspecified: Secondary | ICD-10-CM | POA: Diagnosis not present

## 2020-01-23 DIAGNOSIS — G44209 Tension-type headache, unspecified, not intractable: Secondary | ICD-10-CM | POA: Diagnosis present

## 2020-01-23 DIAGNOSIS — Z823 Family history of stroke: Secondary | ICD-10-CM

## 2020-01-23 DIAGNOSIS — E1022 Type 1 diabetes mellitus with diabetic chronic kidney disease: Secondary | ICD-10-CM | POA: Diagnosis present

## 2020-01-23 DIAGNOSIS — N179 Acute kidney failure, unspecified: Secondary | ICD-10-CM

## 2020-01-23 DIAGNOSIS — Z794 Long term (current) use of insulin: Secondary | ICD-10-CM

## 2020-01-23 DIAGNOSIS — T85694A Other mechanical complication of insulin pump, initial encounter: Secondary | ICD-10-CM | POA: Diagnosis not present

## 2020-01-23 DIAGNOSIS — E7800 Pure hypercholesterolemia, unspecified: Secondary | ICD-10-CM | POA: Diagnosis not present

## 2020-01-23 DIAGNOSIS — E785 Hyperlipidemia, unspecified: Secondary | ICD-10-CM | POA: Diagnosis present

## 2020-01-23 DIAGNOSIS — E871 Hypo-osmolality and hyponatremia: Secondary | ICD-10-CM | POA: Diagnosis present

## 2020-01-23 DIAGNOSIS — Z79899 Other long term (current) drug therapy: Secondary | ICD-10-CM

## 2020-01-23 DIAGNOSIS — N184 Chronic kidney disease, stage 4 (severe): Secondary | ICD-10-CM | POA: Diagnosis present

## 2020-01-23 DIAGNOSIS — N2581 Secondary hyperparathyroidism of renal origin: Secondary | ICD-10-CM | POA: Diagnosis not present

## 2020-01-23 DIAGNOSIS — N189 Chronic kidney disease, unspecified: Secondary | ICD-10-CM | POA: Diagnosis not present

## 2020-01-23 DIAGNOSIS — I160 Hypertensive urgency: Secondary | ICD-10-CM | POA: Diagnosis present

## 2020-01-23 DIAGNOSIS — G4733 Obstructive sleep apnea (adult) (pediatric): Secondary | ICD-10-CM | POA: Diagnosis present

## 2020-01-23 DIAGNOSIS — E1065 Type 1 diabetes mellitus with hyperglycemia: Principal | ICD-10-CM | POA: Diagnosis present

## 2020-01-23 DIAGNOSIS — E872 Acidosis: Secondary | ICD-10-CM | POA: Diagnosis not present

## 2020-01-23 DIAGNOSIS — N183 Chronic kidney disease, stage 3 unspecified: Secondary | ICD-10-CM | POA: Diagnosis not present

## 2020-01-23 DIAGNOSIS — Z20822 Contact with and (suspected) exposure to covid-19: Secondary | ICD-10-CM | POA: Diagnosis present

## 2020-01-23 DIAGNOSIS — E875 Hyperkalemia: Secondary | ICD-10-CM

## 2020-01-23 DIAGNOSIS — E1069 Type 1 diabetes mellitus with other specified complication: Secondary | ICD-10-CM | POA: Diagnosis not present

## 2020-01-23 DIAGNOSIS — Z833 Family history of diabetes mellitus: Secondary | ICD-10-CM

## 2020-01-23 DIAGNOSIS — D631 Anemia in chronic kidney disease: Secondary | ICD-10-CM | POA: Diagnosis present

## 2020-01-23 DIAGNOSIS — Z6833 Body mass index (BMI) 33.0-33.9, adult: Secondary | ICD-10-CM

## 2020-01-23 DIAGNOSIS — I10 Essential (primary) hypertension: Secondary | ICD-10-CM | POA: Diagnosis not present

## 2020-01-23 DIAGNOSIS — I129 Hypertensive chronic kidney disease with stage 1 through stage 4 chronic kidney disease, or unspecified chronic kidney disease: Secondary | ICD-10-CM | POA: Diagnosis not present

## 2020-01-23 DIAGNOSIS — Z8249 Family history of ischemic heart disease and other diseases of the circulatory system: Secondary | ICD-10-CM

## 2020-01-23 DIAGNOSIS — Z8673 Personal history of transient ischemic attack (TIA), and cerebral infarction without residual deficits: Secondary | ICD-10-CM

## 2020-01-23 DIAGNOSIS — Z9641 Presence of insulin pump (external) (internal): Secondary | ICD-10-CM | POA: Diagnosis present

## 2020-01-23 DIAGNOSIS — G459 Transient cerebral ischemic attack, unspecified: Secondary | ICD-10-CM | POA: Diagnosis present

## 2020-01-23 DIAGNOSIS — E10649 Type 1 diabetes mellitus with hypoglycemia without coma: Secondary | ICD-10-CM | POA: Diagnosis not present

## 2020-01-23 DIAGNOSIS — N170 Acute kidney failure with tubular necrosis: Secondary | ICD-10-CM

## 2020-01-23 DIAGNOSIS — E1100 Type 2 diabetes mellitus with hyperosmolarity without nonketotic hyperglycemic-hyperosmolar coma (NKHHC): Secondary | ICD-10-CM | POA: Diagnosis present

## 2020-01-23 LAB — BRAIN NATRIURETIC PEPTIDE: B Natriuretic Peptide: 67 pg/mL (ref 0.0–100.0)

## 2020-01-23 LAB — CBC WITH DIFFERENTIAL/PLATELET
Abs Immature Granulocytes: 0.01 10*3/uL (ref 0.00–0.07)
Basophils Absolute: 0.1 10*3/uL (ref 0.0–0.1)
Basophils Relative: 1 %
Eosinophils Absolute: 0.5 10*3/uL (ref 0.0–0.5)
Eosinophils Relative: 7 %
HCT: 36 % — ABNORMAL LOW (ref 39.0–52.0)
Hemoglobin: 11.9 g/dL — ABNORMAL LOW (ref 13.0–17.0)
Immature Granulocytes: 0 %
Lymphocytes Relative: 25 %
Lymphs Abs: 1.7 10*3/uL (ref 0.7–4.0)
MCH: 28.7 pg (ref 26.0–34.0)
MCHC: 33.1 g/dL (ref 30.0–36.0)
MCV: 87 fL (ref 80.0–100.0)
Monocytes Absolute: 0.6 10*3/uL (ref 0.1–1.0)
Monocytes Relative: 9 %
Neutro Abs: 4 10*3/uL (ref 1.7–7.7)
Neutrophils Relative %: 58 %
Platelets: 285 10*3/uL (ref 150–400)
RBC: 4.14 MIL/uL — ABNORMAL LOW (ref 4.22–5.81)
RDW: 13.4 % (ref 11.5–15.5)
WBC: 6.9 10*3/uL (ref 4.0–10.5)
nRBC: 0 % (ref 0.0–0.2)

## 2020-01-23 LAB — COMPREHENSIVE METABOLIC PANEL
ALT: 21 U/L (ref 0–44)
AST: 24 U/L (ref 15–41)
Albumin: 2.5 g/dL — ABNORMAL LOW (ref 3.5–5.0)
Alkaline Phosphatase: 85 U/L (ref 38–126)
Anion gap: 6 (ref 5–15)
BUN: 49 mg/dL — ABNORMAL HIGH (ref 6–20)
CO2: 22 mmol/L (ref 22–32)
Calcium: 8.1 mg/dL — ABNORMAL LOW (ref 8.9–10.3)
Chloride: 98 mmol/L (ref 98–111)
Creatinine, Ser: 4.11 mg/dL — ABNORMAL HIGH (ref 0.61–1.24)
GFR calc Af Amer: 19 mL/min — ABNORMAL LOW (ref >60)
GFR calc non Af Amer: 16 mL/min — ABNORMAL LOW (ref >60)
Glucose, Bld: 562 mg/dL (ref 70–99)
Potassium: 6.2 mmol/L — ABNORMAL HIGH (ref 3.5–5.1)
Sodium: 126 mmol/L — ABNORMAL LOW (ref 135–145)
Total Bilirubin: 0.8 mg/dL (ref 0.3–1.2)
Total Protein: 5.9 g/dL — ABNORMAL LOW (ref 6.5–8.1)

## 2020-01-23 LAB — OSMOLALITY: Osmolality: 302 mOsm/kg — ABNORMAL HIGH (ref 275–295)

## 2020-01-23 LAB — BASIC METABOLIC PANEL
Anion gap: 6 (ref 5–15)
Anion gap: 8 (ref 5–15)
BUN: 49 mg/dL — ABNORMAL HIGH (ref 6–20)
BUN: 43 mg/dL — ABNORMAL HIGH (ref 6–20)
CO2: 23 mmol/L (ref 22–32)
CO2: 21 mmol/L — ABNORMAL LOW (ref 22–32)
Calcium: 8.3 mg/dL — ABNORMAL LOW (ref 8.9–10.3)
Calcium: 8.5 mg/dL — ABNORMAL LOW (ref 8.9–10.3)
Chloride: 101 mmol/L (ref 98–111)
Chloride: 107 mmol/L (ref 98–111)
Creatinine, Ser: 4.2 mg/dL — ABNORMAL HIGH (ref 0.61–1.24)
Creatinine, Ser: 3.99 mg/dL — ABNORMAL HIGH (ref 0.61–1.24)
GFR calc Af Amer: 18 mL/min — ABNORMAL LOW (ref >60)
GFR calc Af Amer: 19 mL/min — ABNORMAL LOW (ref >60)
GFR calc non Af Amer: 16 mL/min — ABNORMAL LOW (ref >60)
GFR calc non Af Amer: 17 mL/min — ABNORMAL LOW (ref >60)
Glucose, Bld: 493 mg/dL — ABNORMAL HIGH (ref 70–99)
Glucose, Bld: 141 mg/dL — ABNORMAL HIGH (ref 70–99)
Potassium: 5.8 mmol/L — ABNORMAL HIGH (ref 3.5–5.1)
Potassium: 4.5 mmol/L (ref 3.5–5.1)
Sodium: 130 mmol/L — ABNORMAL LOW (ref 135–145)
Sodium: 136 mmol/L (ref 135–145)

## 2020-01-23 LAB — GLUCOSE, CAPILLARY
Glucose-Capillary: 125 mg/dL — ABNORMAL HIGH (ref 70–99)
Glucose-Capillary: 137 mg/dL — ABNORMAL HIGH (ref 70–99)
Glucose-Capillary: 205 mg/dL — ABNORMAL HIGH (ref 70–99)
Glucose-Capillary: 209 mg/dL — ABNORMAL HIGH (ref 70–99)
Glucose-Capillary: 257 mg/dL — ABNORMAL HIGH (ref 70–99)
Glucose-Capillary: 340 mg/dL — ABNORMAL HIGH (ref 70–99)
Glucose-Capillary: 360 mg/dL — ABNORMAL HIGH (ref 70–99)
Glucose-Capillary: 438 mg/dL — ABNORMAL HIGH (ref 70–99)
Glucose-Capillary: 548 mg/dL (ref 70–99)

## 2020-01-23 LAB — BLOOD GAS, VENOUS
Acid-base deficit: 3.5 mmol/L — ABNORMAL HIGH (ref 0.0–2.0)
Bicarbonate: 22.7 mmol/L (ref 20.0–28.0)
O2 Saturation: 61.4 %
Patient temperature: 37
pCO2, Ven: 44 mmHg (ref 44.0–60.0)
pH, Ven: 7.32 (ref 7.250–7.430)
pO2, Ven: 35 mmHg (ref 32.0–45.0)

## 2020-01-23 LAB — BETA-HYDROXYBUTYRIC ACID: Beta-Hydroxybutyric Acid: 0.94 mmol/L — ABNORMAL HIGH (ref 0.05–0.27)

## 2020-01-23 LAB — URINALYSIS, COMPLETE (UACMP) WITH MICROSCOPIC
Bacteria, UA: NONE SEEN
Bilirubin Urine: NEGATIVE
Glucose, UA: >=500 mg/dL — AB
Ketones, ur: NEGATIVE mg/dL
Leukocytes,Ua: NEGATIVE
Nitrite: NEGATIVE
Protein, ur: >=300 mg/dL — AB
Specific Gravity, Urine: 1.011 (ref 1.005–1.030)
pH: 7 (ref 5.0–8.0)

## 2020-01-23 LAB — HEMOGLOBIN A1C
Hgb A1c MFr Bld: 10.5 % — ABNORMAL HIGH (ref 4.8–5.6)
Mean Plasma Glucose: 254.65 mg/dL

## 2020-01-23 LAB — RESPIRATORY PANEL BY RT PCR (FLU A&B, COVID)
Influenza A by PCR: NEGATIVE
Influenza B by PCR: NEGATIVE
SARS Coronavirus 2 by RT PCR: NEGATIVE

## 2020-01-23 MED ORDER — SODIUM CHLORIDE 0.9 % IV BOLUS
1000.0000 mL | Freq: Once | INTRAVENOUS | Status: AC
  Administered 2020-01-23: 1000 mL via INTRAVENOUS

## 2020-01-23 MED ORDER — DEXTROSE-NACL 5-0.45 % IV SOLN: INTRAVENOUS | Status: DC

## 2020-01-23 MED ORDER — ACETAMINOPHEN 325 MG PO TABS
650.0000 mg | ORAL_TABLET | Freq: Four times a day (QID) | ORAL | Status: DC | PRN
  Administered 2020-01-23 – 2020-01-28 (×7): 650 mg via ORAL
  Filled 2020-01-23 (×7): qty 2

## 2020-01-23 MED ORDER — ONDANSETRON HCL 4 MG/2ML IJ SOLN
4.0000 mg | Freq: Three times a day (TID) | INTRAMUSCULAR | Status: DC | PRN
  Administered 2020-01-24 – 2020-01-27 (×3): 4 mg via INTRAVENOUS
  Filled 2020-01-23 (×3): qty 2

## 2020-01-23 MED ORDER — SODIUM CHLORIDE 0.9 % IV SOLN: 1.0000 g | Freq: Once | INTRAVENOUS | Status: DC

## 2020-01-23 MED ORDER — CALCIUM GLUCONATE-NACL 1-0.675 GM/50ML-% IV SOLN
1.0000 g | Freq: Once | INTRAVENOUS | Status: AC
  Administered 2020-01-23: 1000 mg via INTRAVENOUS
  Filled 2020-01-23: qty 50

## 2020-01-23 MED ORDER — INSULIN ASPART 100 UNIT/ML ~~LOC~~ SOLN: 10.0000 [IU] | Freq: Once | SUBCUTANEOUS | Status: DC

## 2020-01-23 MED ORDER — HYDRALAZINE HCL 20 MG/ML IJ SOLN
5.0000 mg | INTRAMUSCULAR | Status: DC | PRN
  Administered 2020-01-23 (×2): 5 mg via INTRAVENOUS
  Filled 2020-01-23 (×2): qty 1
  Filled 2020-01-23: qty 0.25

## 2020-01-23 MED ORDER — PROMETHAZINE HCL 25 MG/ML IJ SOLN
6.2500 mg | Freq: Once | INTRAMUSCULAR | Status: DC
  Filled 2020-01-23: qty 1

## 2020-01-23 MED ORDER — DEXTROSE 50 % IV SOLN: 0.0000 mL | INTRAVENOUS | Status: DC | PRN

## 2020-01-23 MED ORDER — INSULIN REGULAR(HUMAN) IN NACL 100-0.9 UT/100ML-% IV SOLN
INTRAVENOUS | Status: DC
  Administered 2020-01-23: 8 [IU]/h via INTRAVENOUS
  Filled 2020-01-23: qty 100

## 2020-01-23 MED ORDER — PANTOPRAZOLE SODIUM 40 MG PO TBEC: 40.0000 mg | DELAYED_RELEASE_TABLET | ORAL | Status: DC | PRN

## 2020-01-23 MED ORDER — ATORVASTATIN CALCIUM 20 MG PO TABS
80.0000 mg | ORAL_TABLET | Freq: Every evening | ORAL | Status: DC
  Administered 2020-01-23 – 2020-01-27 (×4): 80 mg via ORAL
  Filled 2020-01-23 (×6): qty 4

## 2020-01-23 MED ORDER — SODIUM ZIRCONIUM CYCLOSILICATE 5 G PO PACK
5.0000 g | PACK | Freq: Once | ORAL | Status: AC
  Administered 2020-01-23: 5 g via ORAL
  Filled 2020-01-23: qty 1

## 2020-01-23 MED ORDER — SODIUM CHLORIDE 0.9 % IV SOLN: INTRAVENOUS | Status: DC

## 2020-01-23 MED ORDER — INSULIN REGULAR(HUMAN) IN NACL 100-0.9 UT/100ML-% IV SOLN: INTRAVENOUS | Status: DC

## 2020-01-23 MED ORDER — AMLODIPINE BESYLATE 10 MG PO TABS
10.0000 mg | ORAL_TABLET | Freq: Every day | ORAL | Status: DC
  Administered 2020-01-23 – 2020-01-28 (×6): 10 mg via ORAL
  Filled 2020-01-23 (×6): qty 1

## 2020-01-23 MED ORDER — LABETALOL HCL 5 MG/ML IV SOLN
10.0000 mg | Freq: Once | INTRAVENOUS | Status: AC
  Administered 2020-01-23: 10 mg via INTRAVENOUS
  Filled 2020-01-23: qty 4

## 2020-01-23 MED ORDER — INSULIN PUMP
Freq: Three times a day (TID) | SUBCUTANEOUS | Status: DC
  Administered 2020-01-24: 6 via SUBCUTANEOUS
  Administered 2020-01-24: 12:00:00 5 via SUBCUTANEOUS
  Administered 2020-01-24: 09:00:00 0.8 via SUBCUTANEOUS
  Filled 2020-01-23: qty 1

## 2020-01-23 MED ORDER — ASPIRIN EC 81 MG PO TBEC
81.0000 mg | DELAYED_RELEASE_TABLET | ORAL | Status: DC
  Administered 2020-01-23 – 2020-01-27 (×3): 81 mg via ORAL
  Filled 2020-01-23 (×5): qty 1

## 2020-01-23 MED ORDER — HEPARIN SODIUM (PORCINE) 5000 UNIT/ML IJ SOLN
5000.0000 [IU] | Freq: Three times a day (TID) | INTRAMUSCULAR | Status: DC
  Administered 2020-01-23 – 2020-01-28 (×14): 5000 [IU] via SUBCUTANEOUS
  Filled 2020-01-23 (×15): qty 1

## 2020-01-23 MED ORDER — CARVEDILOL 12.5 MG PO TABS
12.5000 mg | ORAL_TABLET | Freq: Every day | ORAL | Status: DC
  Administered 2020-01-23 – 2020-01-25 (×3): 12.5 mg via ORAL
  Filled 2020-01-23 (×3): qty 1

## 2020-01-23 MED ORDER — TRAMADOL HCL 50 MG PO TABS
50.0000 mg | ORAL_TABLET | Freq: Two times a day (BID) | ORAL | Status: DC | PRN
  Administered 2020-01-23 – 2020-01-25 (×2): 50 mg via ORAL
  Filled 2020-01-23 (×2): qty 1

## 2020-01-23 MED ORDER — HYDRALAZINE HCL 50 MG PO TABS
50.0000 mg | ORAL_TABLET | Freq: Three times a day (TID) | ORAL | Status: DC
  Administered 2020-01-23 – 2020-01-27 (×11): 50 mg via ORAL
  Filled 2020-01-23 (×11): qty 1

## 2020-01-23 NOTE — ED Provider Notes (Signed)
[facility] Emergency Department Provider Note  ____________________________________________   First MD Initiated Contact with Patient 01/23/20 [phone]     (approximate)  I have reviewed the triage vital signs and the nursing notes.   HISTORY  Chief Complaint Hyperglycemia    HPI [name] is a 49 y.o. male with history of type 1 diabetes, chronic kidney disease, here with generalized weakness.  Patient states that for the last week or so, he has felt progressively weak.  He has been having headaches, occasional chest pressure, and generalized weakness.  He has felt dry mouth, polyuria, and low energy.  He went to his PCP yesterday for this along with a complaint of mild, generalized headache.  He had lab work drawn and was told to come here for evaluation because of acute on chronic kidney injury and hyperglycemia.  He states that sugars have begun running in the 40s and 500s throughout the day today.  He just came self 10 units before coming in.  He does not has had decreased urine output over the last day.  No known fevers or chills but in recent sick contacts.  No other complaints.        Past Medical History:  Diagnosis Date  . CKD (chronic kidney disease), stage II   . Gastroparesis   . Herniated cervical disc   . HTN (hypertension)   . Hypercholesteremia   . S/P cardiac cath    a. 10-15 yrs ago at [facility] due to tachycardia, reportedly normal. b. Normal ETT 03/2012.  Marland Kitchen Sinus tachycardia    a. 24-hr Holter 03/2012 - SR, occ PVCs, no VT, avg HR 96bpm.  . TIA (transient ischemic attack)   . Type 1 diabetes mellitus ([place])    a. With insulin pump.    Patient Active Problem List   Diagnosis Date Noted  . CKD (chronic kidney disease), stage IV ([place]) 01/23/2020  . Type 1 diabetes mellitus with hyperosmolar hyperglycemic state (HHS) ([place]) 01/23/2020  . Acute renal failure superimposed on stage 4 chronic kidney disease ([place]) 01/23/2020  .  Hypertensive urgency 01/23/2020  . Hyperkalemia   . Esophageal dysphagia   . Duodenal ulcer   . Ulcer of esophagus without bleeding   . DKA, type 1 ([place]) 09/27/2019  . TIA (transient ischemic attack) 08/28/2018  . Erectile dysfunction 09/08/2017  . Class 2 severe obesity due to excess calories with serious comorbidity and body mass index (BMI) of 35.0 to 35.9 in adult ([place]) 08/02/2017  . CKD (chronic kidney disease) stage 3, GFR 30-59 ml/min 04/22/2017  . Type 1 diabetes mellitus with diabetic neuropathy ([place]) 02/25/2017  . Uncontrolled type 1 diabetes mellitus with hyperglycemia ([place]) 02/25/2017  . Leg pain   . Acute kidney injury ([place])   . Sepsis ([place]) 09/07/2015  . Acute on chronic kidney failure ([place]) 09/07/2015  . Cellulitis of leg, right 09/07/2015  . Herniated cervical disc   . Hypercholesteremia   . HTN (hypertension)   . Nausea & vomiting 10/19/2012  . Diarrhea 10/19/2012  . DKA (diabetic ketoacidoses) ([place]) 10/19/2012  . Chest pain 03/13/2012  . Dyspnea 03/13/2012  . Tachycardia 03/13/2012  . Insulin dependent type 1 diabetes mellitus ([place]) 03/13/2012    Past Surgical History:  Procedure Laterality Date  . BIOPSY  10/03/2019   Procedure: BIOPSY;  Surgeon: Mauri Pole, MD;  Location: [facility] ENDOSCOPY;  Service: Endoscopy;;  . cervical neck fusion    . ESOPHAGOGASTRODUODENOSCOPY (EGD) WITH PROPOFOL N/A 10/03/2019  Procedure: ESOPHAGOGASTRODUODENOSCOPY (EGD) WITH PROPOFOL;  Surgeon: Mauri Pole, MD;  Location: [facility] ENDOSCOPY;  Service: Endoscopy;  Laterality: N/A;  . HERNIA REPAIR     bilateral  . LEFT HEART CATHETERIZATION WITH CORONARY ANGIOGRAM N/A 12/18/2013   Procedure: LEFT HEART CATHETERIZATION WITH CORONARY ANGIOGRAM;  Surgeon: [name] M Martinique, MD;  Location: [facility] CATH LAB;  Service: Cardiovascular;  Laterality: N/A;    Prior to Admission medications   Medication Sig Start Date End Date Taking? Authorizing Provider  acetaminophen (TYLENOL) 500 MG  tablet Take 500-1,000 mg by mouth every 6 (six) hours as needed for mild pain or fever.   Yes [provider]  amLODipine (NORVASC) 10 MG tablet Take 1 tablet (10 mg total) by mouth daily. 10/04/19  Yes Swayze, Ava, DO  aspirin 81 MG EC tablet Take 81 mg by mouth every other day.    Yes [provider]  atorvastatin (LIPITOR) 80 MG tablet Take 80 mg by mouth every evening.    Yes [provider]  carvedilol (COREG) 12.5 MG tablet Take 1 tablet (12.5 mg total) by mouth 2 (two) times daily with a meal. Patient taking differently: Take 12.5 mg by mouth daily.  10/03/19  Yes Swayze, Ava, DO  furosemide (LASIX) 40 MG tablet Take 40 mg by mouth daily. 01/22/20  Yes [provider]  HUMALOG 100 UNIT/ML injection Inject 100 Units into the skin daily. 11/11/16  Yes [provider]  hydrALAZINE (APRESOLINE) 50 MG tablet Take 0.5 tablets (25 mg total) by mouth 3 (three) times daily. Patient taking differently: Take 50 mg by mouth in the morning and at bedtime.  10/30/19  Yes Visser, Jacquelyn D, PA-C  pantoprazole (PROTONIX) 40 MG tablet Take 1 tablet (40 mg total) by mouth daily. Patient taking differently: Take 40 mg by mouth as needed (GERD or reflux symptoms).  03/21/18 01/22/21 Yes Kasa, Maretta Bees, MD  sodium chloride (OCEAN) 0.65 % SOLN nasal spray Place 1 spray into both nostrils as needed for congestion. 10/03/19  Yes Swayze, Ava, DO  gentamicin cream (GARAMYCIN) 0.1 % Apply 1 application topically 2 (two) times daily. Patient not taking: Reported on 01/23/2020 11/23/19   Edrick Kins, DPM    Allergies Oxycontin [oxycodone hcl], Penicillins, Prednisone, and Shellfish-derived products  Family History  Problem Relation Age of Onset  . Hypertension Other   . Coronary artery disease Other        Mother's side - both her side's grandparents died of heart disease (MIs)  . Diabetes Other   . Stroke Other        Paternal grandfather (20)  . Prostate cancer Neg Hx    . Bladder Cancer Neg Hx   . Kidney cancer Neg Hx     Social History Social History   Tobacco Use  . Smoking status: Never Smoker  . Smokeless tobacco: Never Used  Substance Use Topics  . Alcohol use: No  . Drug use: No    Review of Systems  Review of Systems  Constitutional: Positive for fatigue. Negative for chills and fever.  HENT: Negative for sore throat.   Respiratory: Negative for shortness of breath.   Cardiovascular: Negative for chest pain.  Gastrointestinal: Positive for nausea. Negative for abdominal pain.  Endocrine: Positive for polydipsia and polyuria.  Genitourinary: Negative for flank pain.  Musculoskeletal: Negative for neck pain.  Skin: Negative for rash and wound.  Allergic/Immunologic: Negative for immunocompromised state.  Neurological: Positive for headaches. Negative for weakness and numbness.  Hematological: Does  not bruise/bleed easily.  All other systems reviewed and are negative.    ____________________________________________  PHYSICAL EXAM:      VITAL SIGNS: ED Triage Vitals  Enc Vitals Group     BP 01/23/20 0854 (!) 223/103     Pulse Rate 01/23/20 0854 96     Resp 01/23/20 0854 20     Temp 01/23/20 0854 98 F (36.7 C)     Temp Source 01/23/20 0854 Oral     SpO2 01/23/20 0854 98 %     Weight 01/23/20 0855 250 lb (113.4 kg)     Height 01/23/20 0855 6' (1.829 m)     Head Circumference --      Peak Flow --      Pain Score 01/23/20 0904 0     Pain Loc --      Pain Edu? --      Excl. in [place]? --      Physical Exam Vitals and nursing note reviewed.  Constitutional:      General: He is not in acute distress.    Appearance: He is well-developed.  HENT:     Head: Normocephalic and atraumatic.     Mouth/Throat:     Mouth: Mucous membranes are dry.  Eyes:     Conjunctiva/sclera: Conjunctivae normal.  Cardiovascular:     Rate and Rhythm: Regular rhythm. Tachycardia present.     Heart sounds: Normal heart sounds.  Pulmonary:      Effort: Pulmonary effort is normal. No respiratory distress.     Breath sounds: No wheezing.  Abdominal:     General: Abdomen is flat. There is no distension.  Musculoskeletal:     Cervical back: Neck supple.  Skin:    General: Skin is warm.     Capillary Refill: Capillary refill takes less than 2 seconds.     Findings: No rash.  Neurological:     Mental Status: He is alert and oriented to person, place, and time.     Motor: No abnormal muscle tone.       ____________________________________________   LABS (all labs ordered are listed, but only abnormal results are displayed)  Labs Reviewed  CBC WITH DIFFERENTIAL/PLATELET - Abnormal; Notable for the following components:      Result Value   RBC 4.14 (*)    Hemoglobin 11.9 (*)    HCT 36.0 (*)    All other components within normal limits  COMPREHENSIVE METABOLIC PANEL - Abnormal; Notable for the following components:   Sodium 126 (*)    Potassium 6.2 (*)    Glucose, Bld 562 (*)    BUN 49 (*)    Creatinine, Ser 4.11 (*)    Calcium 8.1 (*)    Total Protein 5.9 (*)    Albumin 2.5 (*)    GFR calc non Af Amer 16 (*)    GFR calc Af Amer 19 (*)    All other components within normal limits  BLOOD GAS, VENOUS - Abnormal; Notable for the following components:   Acid-base deficit 3.5 (*)    All other components within normal limits  URINALYSIS, COMPLETE (UACMP) WITH MICROSCOPIC - Abnormal; Notable for the following components:   Color, Urine STRAW (*)    APPearance CLEAR (*)    Glucose, UA >=500 (*)    Hgb urine dipstick SMALL (*)    Protein, ur >=300 (*)    All other components within normal limits  BETA-HYDROXYBUTYRIC ACID - Abnormal; Notable for the following components:   Beta-Hydroxybutyric  Acid 0.94 (*)    All other components within normal limits  GLUCOSE, CAPILLARY - Abnormal; Notable for the following components:   Glucose-Capillary 548 (*)    All other components within normal limits  BASIC METABOLIC PANEL -  Abnormal; Notable for the following components:   Sodium 130 (*)    Potassium 5.8 (*)    Glucose, Bld 493 (*)    BUN 49 (*)    Creatinine, Ser 4.20 (*)    Calcium 8.3 (*)    GFR calc non Af Amer 16 (*)    GFR calc Af Amer 18 (*)    All other components within normal limits  GLUCOSE, CAPILLARY - Abnormal; Notable for the following components:   Glucose-Capillary 438 (*)    All other components within normal limits  GLUCOSE, CAPILLARY - Abnormal; Notable for the following components:   Glucose-Capillary 360 (*)    All other components within normal limits  GLUCOSE, CAPILLARY - Abnormal; Notable for the following components:   Glucose-Capillary 209 (*)    All other components within normal limits  GLUCOSE, CAPILLARY - Abnormal; Notable for the following components:   Glucose-Capillary 125 (*)    All other components within normal limits  BASIC METABOLIC PANEL - Abnormal; Notable for the following components:   CO2 21 (*)    Glucose, Bld 141 (*)    BUN 43 (*)    Creatinine, Ser 3.99 (*)    Calcium 8.5 (*)    GFR calc non Af Amer 17 (*)    GFR calc Af Amer 19 (*)    All other components within normal limits  OSMOLALITY - Abnormal; Notable for the following components:   Osmolality 302 (*)    All other components within normal limits  GLUCOSE, CAPILLARY - Abnormal; Notable for the following components:   Glucose-Capillary 137 (*)    All other components within normal limits  RESPIRATORY PANEL BY RT PCR (FLU A&B, COVID)  BRAIN NATRIURETIC PEPTIDE  BASIC METABOLIC PANEL  BASIC METABOLIC PANEL  BASIC METABOLIC PANEL  HEMOGLOBIN A1C  CBG MONITORING, ED    ____________________________________________  EKG: Normal sinus rhythm, ventricular rate 92.  QRS 98, QTc 447.  No acute ST elevations or depressions. ________________________________________  RADIOLOGY All imaging, including plain films, CT scans, and ultrasounds, independently reviewed by me, and interpretations confirmed  via formal radiology reads.  ED MD interpretation:     Official radiology report(s): No results found.  ____________________________________________  PROCEDURES   Procedure(s) performed (including Critical Care):  .Critical Care Performed by: Duffy Bruce, MD Authorized by: Duffy Bruce, MD   Critical care provider statement:    Critical care time (minutes):  35   Critical care time was exclusive of:  Separately billable procedures and treating other patients and teaching time   Critical care was necessary to treat or prevent imminent or life-threatening deterioration of the following conditions:  Cardiac failure, respiratory failure, circulatory failure and metabolic crisis   Critical care was time spent personally by me on the following activities:  Development of treatment plan with patient or surrogate, discussions with consultants, evaluation of patient's response to treatment, examination of patient, obtaining history from patient or surrogate, ordering and performing treatments and interventions, ordering and review of laboratory studies, ordering and review of radiographic studies, pulse oximetry, re-evaluation of patient's condition and review of old charts   I assumed direction of critical care for this patient from another provider in my specialty: no   .1-3 Lead EKG Interpretation  Performed by: Duffy Bruce, MD Authorized by: Duffy Bruce, MD     Interpretation: normal     ECG rate:  90s   ECG rate assessment: normal     Rhythm: sinus rhythm     Ectopy: none     Conduction: normal   Comments:     Indication: AKI, DKA< hyperkalemia    ____________________________________________  INITIAL IMPRESSION / MDM / ASSESSMENT AND PLAN / ED COURSE  As part of my medical decision making, I reviewed the following data within the [facility] notes reviewed and incorporated, Old chart reviewed, Notes from prior ED visits, and [place] Controlled  Substance Database       *[name] was evaluated in Emergency Department on 01/23/2020 for the symptoms described in the history of present illness. He was evaluated in the context of the global COVID-19 pandemic, which necessitated consideration that the patient might be at risk for infection with the SARS-CoV-2 virus that causes COVID-19. Institutional protocols and algorithms that pertain to the evaluation of patients at risk for COVID-19 are in a state of rapid change based on information released by regulatory bodies including the CDC and federal and state organizations. These policies and algorithms were followed during the patient's care in the ED.  Some ED evaluations and interventions may be delayed as a result of limited staffing during the pandemic.*     Medical Decision Making: 49 year old male here with generalized weakness.  Lab work reviewed, suspect generalized weakness due to significant hyperglycemia with possible early DKA with elevated beta hydroxybutyric acid.  Anion gap is normal.  Patient also has a significant acute on chronic kidney injury.  Potassium 5.8 on repeat BMP, and he does have slight peaking of T waves as well, so he was temporized with insulin as well as calcium.  Suspect worsening diabetes causing hyperglycemia with dehydration and prerenal AKI leading to hyperkalemia.  Given his marked hyperglycemia with elevated beta hydroxybutyrate, patient placed on insulin drip and given IV fluids.  I also discussed with Dr. Holley Raring as well, who is aware pt is in hospital.  ____________________________________________  FINAL CLINICAL IMPRESSION(S) / ED DIAGNOSES  Final diagnoses:  Diabetic ketoacidosis without coma associated with type 1 diabetes mellitus ([place])  Acute renal failure superimposed on stage 3 chronic kidney disease, unspecified acute renal failure type, unspecified whether stage 3a or 3b CKD ([place])  Hyperkalemia     MEDICATIONS GIVEN DURING THIS  VISIT:  Medications  insulin regular, human (MYXREDLIN) 100 units/ 100 mL infusion (1.6 Units/hr Intravenous Rate/Dose Change 01/23/20 1325)  0.9 %  sodium chloride infusion ( Intravenous New Bag/Given 01/23/20 1218)  dextrose 5 %-0.45 % sodium chloride infusion ( Intravenous Stopped 01/23/20 1325)  dextrose 50 % solution 0-50 mL (has no administration in time range)  hydrALAZINE (APRESOLINE) injection 5 mg (5 mg Intravenous Given 01/23/20 1358)  ondansetron (ZOFRAN) injection 4 mg (has no administration in time range)  acetaminophen (TYLENOL) tablet 650 mg (650 mg Oral Given 01/23/20 1427)  heparin injection 5,000 Units (has no administration in time range)  sodium chloride 0.9 % bolus 1,000 mL (0 mLs Intravenous Stopped 01/23/20 1202)  calcium gluconate 1 g/ 50 mL sodium chloride IVPB (0 g Intravenous Stopped 01/23/20 1202)  sodium chloride 0.9 % bolus 1,000 mL (0 mLs Intravenous Stopped 01/23/20 1252)  sodium zirconium cyclosilicate (LOKELMA) packet 5 g (5 g Oral Given 01/23/20 1320)  labetalol (NORMODYNE) injection 10 mg (10 mg Intravenous Given 01/23/20 1530)  ED Discharge Orders    None       Note:  This document was prepared using Dragon voice recognition software and may include unintentional dictation errors.   Duffy Bruce, MD 01/23/20 [phone]

## 2020-01-23 NOTE — Progress Notes (Signed)
Inpatient Diabetes Program Recommendations  AACE/ADA: New Consensus Statement on Inpatient Glycemic Control (2015)  Target Ranges:  Prepandial:   less than 140 mg/dL      Peak postprandial:   less than 180 mg/dL (1-2 hours)      Critically ill patients:  140 - 180 mg/dL   Lab Results  Component Value Date   GLUCAP 360 (H) 01/23/2020   HGBA1C 10.2 (H) 09/27/2019    Review of Glycemic Control  Diabetes history: DM1 Outpatient Diabetes medications: Medtronic 670 G Insulin Pump  Current orders for Inpatient glycemic control: IV insulin drip  Inpatient Diabetes Program Recommendations:   Spoke with patient and wife. 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 Patient corrected without decrease in CBGs. Reviewed with patient when his CBGs increase next time and does not respond to insulin, please change insulin pump insertion site. Patient verbalized understanding and did not have a set with him @ work today as he normally does. Patient to followup with Dr. Gabriel Carina post discharge. Patient states he needs assistance with carbohydrate counting and has materials but needs to review what he has. Declined offer for referral for outpatient diabetes education for now but reviewed the resource if needed. Patient states he sometimes overcorrects his hypoglycemia and CBG elevates high afterward. Patient had lab drawn @ [facility] yesterday and A1c was 10.9 (average blood glucose 266 over the past 2-3 months) Reviewed with patient risks with elevated CBGs and patient acknowleges understanding. Patient states when he attempted to get his Dexcome sensors price per month >$400. Patient agrees to call his insurance and inquire if need authorization to decrease cost.  Per last office visit note @ Dr. Gabriel Carina (endocrinologist office) "1. Type 1 diabetes. His Hb A1c was 10.4% on 10/11/19. He has a Medtronic 670G insulin pump. Restarted it on 10/03/2019. No longer using his DexCom G6  continuous glucose monitor due to cost, however he plans to start it again soon. His devices were downloaded and reviewed.  Basal rates 12 am 1.00 units/hr  5:30 am 1.15 units/hr 24-hr basal = 26.775 units  Bolus settings I:C ratio 12 am 1:9.5 Sensitivity 40 Target 120-120 Insulin active time 3 hrs"

## 2020-01-23 NOTE — ED Triage Notes (Signed)
Pt arrives via POV from home for reports of abnormal labs by [facility] clinic yesterday. [facility] reports glucose was 459 when labs were done. PT has an insulin pump. PT A&Ox4 and in NAD, reports mouth has been more dry over the past few days.

## 2020-01-23 NOTE — ED Notes (Addendum)
Glucose 125, per endo tool, insulin line should now be clamped, Dr. Ellender Hose informed and line clamped by this RN

## 2020-01-23 NOTE — ED Notes (Signed)
Pt gave himself 10.7 units of humalog insulin via insulin pump

## 2020-01-23 NOTE — ED Notes (Signed)
Pt placed on cardiac monitor. VS and EKG obtained. Jessica,RN aware that pt is in rm.

## 2020-01-23 NOTE — ED Triage Notes (Signed)
Pt states he went to his PCP yesterday due to HTN, states they called him back today and told him to come to the ED, states he has abnormal kidney function, Na+ 129, CBG 459

## 2020-01-23 NOTE — H&P (Signed)
History and Physical    [name] MVH:[id number] DOB: 03-24-1971 DOA: 01/23/2020  Referring MD/NP/PA:   PCP: [name], MD   Patient coming from:  The patient is coming from home.  At baseline, pt is independent for most of ADL.        Chief Complaint: Generalized weakness and abnormal lab  HPI: [name] is a 49 y.o. male with medical history significant of hypertension, hyperlipidemia, type 1 diabetes on insulin pump, CKD stage IV, PVC, TIA, who presents with generalized weakness and abnormal labs.   Pt states that he has been feeling weak and fatigue for more than 1 week. He has been having headaches, occasional chest pressure, and generalized weakness.  No unilateral numbness or tingling to extremities.  No facial droop or slurred speech. He was seen by PCP yesterday and had lab work drawn. He was told to come to ED for further evaluation and treatment because he has an elevated blood sugar, worsening renal function.  He states that he has dry mouth and polyuria, but no dysuria or burning on urination. He states that sugars have begun running in the 400s and 500s throughout the day today.  Currently patient does not have chest pain, shortness breath, cough.  He has nausea, no vomiting, diarrhea or abdominal pain.  ED Course: pt was found to have blood sugar 562, 493 by BMP, negative COVID-19 PCR, normal anion gap 6, urinalysis negative for ketone, beta hydroxybutyric acid 0.94, WBC 6.9, BNP 67, potassium 5.8, bicarbonate 23, worsening renal function, creatinine 4.20, BUN 49 (baseline creatinine 2.6-3.0 recently), temperature normal, blood pressure 223/103, heart rate 86, oxygen saturation 95% on room air.  Patient is placed on MedSurg bed for observation.   Review of Systems:   General: no fevers, chills, no body weight gain, has fatigue and HA HEENT: no blurry vision, hearing changes or sore throat Respiratory: no dyspnea, coughing, wheezing CV: no chest pain,  no palpitations GI: has nausea, no vomiting, abdominal pain, diarrhea, constipation GU: no dysuria, burning on urination, has increased urinary frequency, no hematuria  Ext: no leg edema Neuro: no unilateral weakness, numbness, or tingling, no vision change or hearing loss Skin: no rash, no skin tear. MSK: No muscle spasm, no deformity, no limitation of range of movement in spin Heme: No easy bruising.  Travel history: No recent long distant travel.  Allergy:  Allergies  Allergen Reactions  . Oxycontin [Oxycodone Hcl] Nausea And Vomiting  . Penicillins Other (See Comments)    Possible reaction many years ago per patient  . Prednisone Other (See Comments)    Patient is diabetic, runs sugar up  . Shellfish-Derived Products     Pt is not allergic to iodine, has had iodine in the past w/o premeds and w/ no problems    Past Medical History:  Diagnosis Date  . CKD (chronic kidney disease), stage II   . Gastroparesis   . Herniated cervical disc   . HTN (hypertension)   . Hypercholesteremia   . S/P cardiac cath    a. 10-15 yrs ago at [facility] due to tachycardia, reportedly normal. b. Normal ETT 03/2012.  Marland Kitchen Sinus tachycardia    a. 24-hr Holter 03/2012 - SR, occ PVCs, no VT, avg HR 96bpm.  . TIA (transient ischemic attack)   . Type 1 diabetes mellitus ([place])    a. With insulin pump.    Past Surgical History:  Procedure Laterality Date  . BIOPSY  10/03/2019   Procedure: BIOPSY;  Surgeon: Mauri Pole, MD;  Location: [facility];  Service: Endoscopy;;  . cervical neck fusion    . ESOPHAGOGASTRODUODENOSCOPY (EGD) WITH PROPOFOL N/A 10/03/2019   Procedure: ESOPHAGOGASTRODUODENOSCOPY (EGD) WITH PROPOFOL;  Surgeon: Mauri Pole, MD;  Location: [facility];  Service: Endoscopy;  Laterality: N/A;  . HERNIA REPAIR     bilateral  . LEFT HEART CATHETERIZATION WITH CORONARY ANGIOGRAM N/A 12/18/2013   Procedure: LEFT HEART CATHETERIZATION WITH CORONARY ANGIOGRAM;  Surgeon: [name] M  Martinique, MD;  Location: [facility] CATH LAB;  Service: Cardiovascular;  Laterality: N/A;    Social History:  reports that he has never smoked. He has never used smokeless tobacco. He reports that he does not drink alcohol or use drugs.  Family History:  Family History  Problem Relation Age of Onset  . Hypertension Other   . Coronary artery disease Other        Mother's side - both her side's grandparents died of heart disease (MIs)  . Diabetes Other   . Stroke Other        Paternal grandfather (43)  . Prostate cancer Neg Hx   . Bladder Cancer Neg Hx   . Kidney cancer Neg Hx      Prior to Admission medications   Medication Sig Start Date End Date Taking? Authorizing Provider  acetaminophen (TYLENOL) 500 MG tablet Take 500-1,000 mg by mouth every 6 (six) hours as needed for mild pain or fever.   Yes [provider]  amLODipine (NORVASC) 10 MG tablet Take 1 tablet (10 mg total) by mouth daily. 10/04/19  Yes Swayze, Ava, DO  aspirin 81 MG EC tablet Take 81 mg by mouth every other day.    Yes [provider]  atorvastatin (LIPITOR) 80 MG tablet Take 80 mg by mouth every evening.    Yes [provider]  carvedilol (COREG) 12.5 MG tablet Take 1 tablet (12.5 mg total) by mouth 2 (two) times daily with a meal. Patient taking differently: Take 12.5 mg by mouth daily.  10/03/19  Yes Swayze, Ava, DO  furosemide (LASIX) 40 MG tablet Take 40 mg by mouth daily. 01/22/20  Yes [provider]  HUMALOG 100 UNIT/ML injection Inject 100 Units into the skin daily. 11/11/16  Yes [provider]  hydrALAZINE (APRESOLINE) 50 MG tablet Take 0.5 tablets (25 mg total) by mouth 3 (three) times daily. Patient taking differently: Take 50 mg by mouth in the morning and at bedtime.  10/30/19  Yes Visser, Jacquelyn D, PA-C  pantoprazole (PROTONIX) 40 MG tablet Take 1 tablet (40 mg total) by mouth daily. Patient taking differently: Take 40 mg by mouth as needed (GERD or reflux  symptoms).  03/21/18 01/22/21 Yes Kasa, Maretta Bees, MD  sodium chloride (OCEAN) 0.65 % SOLN nasal spray Place 1 spray into both nostrils as needed for congestion. 10/03/19  Yes Swayze, Ava, DO  gentamicin cream (GARAMYCIN) 0.1 % Apply 1 application topically 2 (two) times daily. Patient not taking: Reported on 01/23/2020 11/23/19   Edrick Kins, DPM    Physical Exam: Vitals:   01/23/20 1045 01/23/20 1358 01/23/20 1515 01/23/20 1537  BP: (!) 182/92 (!) 211/112 (!) 191/91 (!) 157/73  Pulse:   90 82  Resp:   18 16  Temp:      TempSrc:      SpO2:   97% 96%  Weight:      Height:       General: Not in acute distress.  Dry mucosal membrane HEENT:  Eyes: PERRL, EOMI, no scleral icterus.       ENT: No discharge from the ears and nose, no pharynx injection, no tonsillar enlargement.        Neck: No JVD, no bruit, no mass felt. Heme: No neck lymph node enlargement. Cardiac: S1/S2, RRR, No murmurs, No gallops or rubs. Respiratory: No rales, wheezing, rhonchi or rubs. GI: Soft, nondistended, nontender, no rebound pain, no organomegaly, BS present. GU: No hematuria Ext: No pitting leg edema bilaterally. 2+DP/PT pulse bilaterally. Musculoskeletal: No joint deformities, No joint redness or warmth, no limitation of ROM in spin. Skin: No rashes.  Neuro: Alert, oriented X3, cranial nerves II-XII grossly intact, moves all extremities normally.  Psych: Patient is not psychotic, no suicidal or hemocidal ideation.  Labs on Admission: I have personally reviewed following labs and imaging studies  CBC: Recent Labs  Lab 01/23/20 0921  WBC 6.9  NEUTROABS 4.0  HGB 11.9*  HCT 36.0*  MCV 87.0  PLT 333   Basic Metabolic Panel: Recent Labs  Lab 01/23/20 0921 01/23/20 1024 01/23/20 1459  NA 126* 130* 136  K 6.2* 5.8* 4.5  CL 98 101 107  CO2 22 23 21*  GLUCOSE 562* 493* 141*  BUN 49* 49* 43*  CREATININE 4.11* 4.20* 3.99*  CALCIUM 8.1* 8.3* 8.5*   GFR: Estimated Creatinine Clearance: 29.8  mL/min (A) (by C-G formula based on SCr of 3.99 mg/dL (H)). Liver Function Tests: Recent Labs  Lab 01/23/20 0921  AST 24  ALT 21  ALKPHOS 85  BILITOT 0.8  PROT 5.9*  ALBUMIN 2.5*   No results for input(s): LIPASE, AMYLASE in the last 168 hours. No results for input(s): AMMONIA in the last 168 hours. Coagulation Profile: No results for input(s): INR, PROTIME in the last 168 hours. Cardiac Enzymes: No results for input(s): CKTOTAL, CKMB, CKMBINDEX, TROPONINI in the last 168 hours. BNP (last 3 results) No results for input(s): PROBNP in the last 8760 hours. HbA1C: No results for input(s): HGBA1C in the last 72 hours. CBG: Recent Labs  Lab 01/23/20 1207 01/23/20 1317 01/23/20 1420 01/23/20 1523 01/23/20 1719  GLUCAP 360* 209* 125* 137* 205*   Lipid Profile: No results for input(s): CHOL, HDL, LDLCALC, TRIG, CHOLHDL, LDLDIRECT in the last 72 hours. Thyroid Function Tests: No results for input(s): TSH, T4TOTAL, FREET4, T3FREE, THYROIDAB in the last 72 hours. Anemia Panel: No results for input(s): VITAMINB12, FOLATE, FERRITIN, TIBC, IRON, RETICCTPCT in the last 72 hours. Urine analysis:    Component Value Date/Time   COLORURINE STRAW (A) 01/23/2020 0922   APPEARANCEUR CLEAR (A) 01/23/2020 0922   LABSPEC 1.011 01/23/2020 0922   PHURINE 7.0 01/23/2020 0922   GLUCOSEU >=500 (A) 01/23/2020 0922   HGBUR SMALL (A) 01/23/2020 0922   BILIRUBINUR NEGATIVE 01/23/2020 0922   KETONESUR NEGATIVE 01/23/2020 0922   PROTEINUR >=300 (A) 01/23/2020 0922   UROBILINOGEN 0.2 06/07/2014 0151   NITRITE NEGATIVE 01/23/2020 0922   LEUKOCYTESUR NEGATIVE 01/23/2020 0922   Sepsis Labs: @LABRCNTIP (procalcitonin:4,lacticidven:4) ) Recent Results (from the past 240 hour(s))  Respiratory Panel by RT PCR (Flu A&B, Covid) - Nasopharyngeal Swab     Status: None   Collection Time: 01/23/20 12:18 PM   Specimen: Nasopharyngeal Swab  Result Value Ref Range Status   SARS Coronavirus 2 by RT PCR  NEGATIVE NEGATIVE Final    Comment: (NOTE) SARS-CoV-2 target nucleic acids are NOT DETECTED. The SARS-CoV-2 RNA is generally detectable in upper respiratoy specimens during the acute phase of infection. The lowest concentration of SARS-CoV-2  viral copies this assay can detect is 131 copies/mL. A negative result does not preclude SARS-Cov-2 infection and should not be used as the sole basis for treatment or other patient management decisions. A negative result may occur with  improper specimen collection/handling, submission of specimen other than nasopharyngeal swab, presence of viral mutation(s) within the areas targeted by this assay, and inadequate number of viral copies (<131 copies/mL). A negative result must be combined with clinical observations, patient history, and epidemiological information. The expected result is Negative. Fact Sheet for Patients:  PinkCheek.be Fact Sheet for Healthcare Providers:  GravelBags.it This test is not yet ap proved or cleared by the [place] FDA and  has been authorized for detection and/or diagnosis of SARS-CoV-2 by FDA under an Emergency Use Authorization (EUA). This EUA will remain  in effect (meaning this test can be used) for the duration of the COVID-19 declaration under Section 564(b)(1) of the Act, 21 U.S.C. section 360bbb-3(b)(1), unless the authorization is terminated or revoked sooner.    Influenza A by PCR NEGATIVE NEGATIVE Final   Influenza B by PCR NEGATIVE NEGATIVE Final    Comment: (NOTE) The Xpert Xpress SARS-CoV-2/FLU/RSV assay is intended as an aid in  the diagnosis of influenza from Nasopharyngeal swab specimens and  should not be used as a sole basis for treatment. Nasal washings and  aspirates are unacceptable for Xpert Xpress SARS-CoV-2/FLU/RSV  testing. Fact Sheet for Patients: PinkCheek.be Fact Sheet for Healthcare Providers:  GravelBags.it This test is not yet approved or cleared by the [place] FDA and  has been authorized for detection and/or diagnosis of SARS-CoV-2 by  FDA under an Emergency Use Authorization (EUA). This EUA will remain  in effect (meaning this test can be used) for the duration of the  Covid-19 declaration under Section 564(b)(1) of the Act, 21  U.S.C. section 360bbb-3(b)(1), unless the authorization is  terminated or revoked. Performed at [facility], [address] [place], [place] [address]      Radiological Exams on Admission: No results found.   EKG: Independently reviewed.  Sinus rhythm, QTC 447, LAD, no T wave peaking   Assessment/Plan Principal Problem:   Type 1 diabetes mellitus with hyperosmolar hyperglycemic state (HHS) ([place]) Active Problems:   Insulin dependent type 1 diabetes mellitus (HCC)   Hypercholesteremia   HTN (hypertension)   TIA (transient ischemic attack)   Acute renal failure superimposed on stage 4 chronic kidney disease (HCC)   Hypertensive urgency   Hyperkalemia   Tpe 1 diabetes mellitus with hyperosmolar hyperglycemic state (HHS) ([place]):  blood sugar 562, 493 by BMP, normal anion gap 6, urinalysis negative for ketone, beta hydroxybutyric acid 0.94. pt was treated with insulin gtt initially. Blood sugar improved to 120-140s.   -will admit to SDU for obs -->changed to med-surg bed for obs -IVF: 2L NS and then 75 cc/h - insulin gtt is d/c'ed  - start insulin pump  Insulin dependent type 1 diabetes mellitus ([place]): Most recent A1c 10.2, poorly controled. Patient is on insulin pump at home -restarted insulin pump  Hypercholesteremia: -Lipitor  HTN (hypertension) and Hypertensive urgency: Bp 223/103 -->157/73 -pt was given 10 mg of IV labetalol -As needed IV hydralazine -Continue home amlodipine, Coreg -increase hydralazine from 50 mg twice daily to 3 times daily  TIA (transient ischemic attack) -ASA  and lipitor  Acute renal failure superimposed on stage 4 chronic kidney disease ([place]): Baseline creatinine 2.6-3.0 recently.  He is creatinine is 4.20, BUN 49, most likely due to  dehydration. -IVF as above -f/u renal Fx by BMP -renal, Dr. Holley Raring is consulted  Hyperkalemia: K 5.8 which has resolved --> 4.5 -pt was on insulin gtt and IVF  -5 g of Lokelma is given      DVT ppx: SQ Heparin    Code Status: Full code Family Communication: Yes, patient's wife at bed side Disposition Plan:  Anticipate discharge back to previous home environment Consults called:  none Admission status: SDU for obs -->changed to Med-surg bed for obs      Status is: Observation The patient remains OBS appropriate and will d/c before 2 midnights. Dispo: The patient is from: Home              Anticipated d/c is to: Home              Anticipated d/c date is: 1 day              Patient currently is not medically stable to d/c.          Date of Service 01/23/2020    [facility]   If 7PM-7AM, please contact night-coverage [email or web address] 01/23/2020, 6:20 PM

## 2020-01-23 NOTE — Progress Notes (Signed)
[facility]  ROUNDING NOTE   Subjective:  Patient well-known to [place] from the office. We follow him for underlying chronic kidney disease though he has been lost to follow-up. Comes in now with generalized weakness and abnormal labs. Has been experiencing weakness and fatigue for over a week. Blood sugars have been ranging in the 400s to 500s. Now found to have acute kidney injury. Recent baseline creatinine has been 2.98 with an EGFR of 24.  Objective:  Vital signs in last 24 hours:  Temp:  [98 F (36.7 C)] 98 F (36.7 C) (04/21 0903) Pulse Rate:  [82-96] 82 (04/21 1537) Resp:  [8-20] 16 (04/21 1537) BP: (157-223)/(73-112) 157/73 (04/21 1537) SpO2:  [95 %-98 %] 96 % (04/21 1537) Weight:  [113.4 kg] 113.4 kg (04/21 0904)  Weight change:  Filed Weights   01/23/20 0855 01/23/20 0904  Weight: 113.4 kg 113.4 kg    Intake/Output: No intake/output data recorded.   Intake/Output this shift:  No intake/output data recorded.  Physical Exam: General: No acute distress  Head: Normocephalic, atraumatic. Moist oral mucosal membranes  Eyes: Anicteric  Neck: Supple, trachea midline  Lungs:  Clear to auscultation, normal effort  Heart: S1S2 no rubs  Abdomen:  Soft, nontender, bowel sounds present  Extremities: 1+ peripheral edema.  Neurologic: Awake, alert, following commands  Skin: No lesions       Basic Metabolic Panel: Recent Labs  Lab 01/23/20 0921 01/23/20 1024 01/23/20 1459  NA 126* 130* 136  K 6.2* 5.8* 4.5  CL 98 101 107  CO2 22 23 21*  GLUCOSE 562* 493* 141*  BUN 49* 49* 43*  CREATININE 4.11* 4.20* 3.99*  CALCIUM 8.1* 8.3* 8.5*    Liver Function Tests: Recent Labs  Lab 01/23/20 0921  AST 24  ALT 21  ALKPHOS 85  BILITOT 0.8  PROT 5.9*  ALBUMIN 2.5*   No results for input(s): LIPASE, AMYLASE in the last 168 hours. No results for input(s): AMMONIA in the last 168 hours.  CBC: Recent Labs  Lab 01/23/20 0921  WBC 6.9  NEUTROABS 4.0   HGB 11.9*  HCT 36.0*  MCV 87.0  PLT 285    Cardiac Enzymes: No results for input(s): CKTOTAL, CKMB, CKMBINDEX, TROPONINI in the last 168 hours.  BNP: Invalid input(s): POCBNP  CBG: Recent Labs  Lab 01/23/20 1207 01/23/20 1317 01/23/20 1420 01/23/20 1523 01/23/20 1719  GLUCAP 360* 209* 125* 137* 205*    Microbiology: Results for orders placed or performed during the hospital encounter of 01/23/20  Respiratory Panel by RT PCR (Flu A&B, Covid) - Nasopharyngeal Swab     Status: None   Collection Time: 01/23/20 12:18 PM   Specimen: Nasopharyngeal Swab  Result Value Ref Range Status   SARS Coronavirus 2 by RT PCR NEGATIVE NEGATIVE Final    Comment: (NOTE) SARS-CoV-2 target nucleic acids are NOT DETECTED. The SARS-CoV-2 RNA is generally detectable in upper respiratoy specimens during the acute phase of infection. The lowest concentration of SARS-CoV-2 viral copies this assay can detect is 131 copies/mL. A negative result does not preclude SARS-Cov-2 infection and should not be used as the sole basis for treatment or other patient management decisions. A negative result may occur with  improper specimen collection/handling, submission of specimen other than nasopharyngeal swab, presence of viral mutation(s) within the areas targeted by this assay, and inadequate number of viral copies (<131 copies/mL). A negative result must be combined with clinical observations, patient history, and epidemiological information. The expected result is Negative.  Fact Sheet for Patients:  PinkCheek.be Fact Sheet for Healthcare Providers:  GravelBags.it This test is not yet ap proved or cleared by the [place] FDA and  has been authorized for detection and/or diagnosis of SARS-CoV-2 by FDA under an Emergency Use Authorization (EUA). This EUA will remain  in effect (meaning this test can be used) for the duration of the COVID-19  declaration under Section 564(b)(1) of the Act, 21 U.S.C. section 360bbb-3(b)(1), unless the authorization is terminated or revoked sooner.    Influenza A by PCR NEGATIVE NEGATIVE Final   Influenza B by PCR NEGATIVE NEGATIVE Final    Comment: (NOTE) The Xpert Xpress SARS-CoV-2/FLU/RSV assay is intended as an aid in  the diagnosis of influenza from Nasopharyngeal swab specimens and  should not be used as a sole basis for treatment. Nasal washings and  aspirates are unacceptable for Xpert Xpress SARS-CoV-2/FLU/RSV  testing. Fact Sheet for Patients: PinkCheek.be Fact Sheet for Healthcare Providers: GravelBags.it This test is not yet approved or cleared by the [place] FDA and  has been authorized for detection and/or diagnosis of SARS-CoV-2 by  FDA under an Emergency Use Authorization (EUA). This EUA will remain  in effect (meaning this test can be used) for the duration of the  Covid-19 declaration under Section 564(b)(1) of the Act, 21  U.S.C. section 360bbb-3(b)(1), unless the authorization is  terminated or revoked. Performed at [facility], [address] [place], [place] [address]     Coagulation Studies: No results for input(s): LABPROT, INR in the last 72 hours.  Urinalysis: Recent Labs    01/23/20 0922  COLORURINE STRAW*  LABSPEC 1.011  PHURINE 7.0  GLUCOSEU >=500*  HGBUR SMALL*  BILIRUBINUR NEGATIVE  KETONESUR NEGATIVE  PROTEINUR >=300*  NITRITE NEGATIVE  LEUKOCYTESUR NEGATIVE      Imaging: No results found.   Medications:   . sodium chloride 75 mL/hr at 01/23/20 1218   . heparin  5,000 Units Subcutaneous Q8H   acetaminophen, dextrose, hydrALAZINE, ondansetron (ZOFRAN) IV  Assessment/ Plan:  49 y.o. male with past medical history of chronic kidney disease stage IV, hypertension, hyperlipidemia, history of TIA, diabetes mellitus type 1, anemia of chronic kidney disease,  secondary hyperparathyroidism who presents now hyperosmolar and hyperglycemic state.  1.  Acute kidney injury/chronic kidney disease stage IV baseline EGFR 24.  Patient has been lost to outpatient follow-up for over a year.  Suspect acute kidney injury related to hypoglycemia and hyperosmolar state now.  Continue IV fluid hydration along with insulin drip to treat the hyperosmolar state.  Check renal ultrasound to make sure no underlying obstruction.  Thereafter he will need close outpatient follow-up.  2.  Anemia of chronic kidney disease.  Hemoglobin currently 11.9.  Suspect that this will drop as a hyperosmolar state is treated.  3.  Hypertension.  Long-term we would like to get the patient back on an ARB.  Once his renal function is stabilized we will look to do this.  4.  Thanks for consultation.   LOS: 0 [place] Height 4/21/20216:27 PM

## 2020-01-23 NOTE — ED Notes (Signed)
Report received from [place], [place].  Pt resting in POC with wife at bedside.  GCS 15, rr even/unlabored, no acute s/s of distress noted.  +PMSC x4, skin warm/pink/dry. VSS with elevated BP.  Updated on plan of care/verbalized understanding.  Will continue to monitor/reassess.

## 2020-01-24 DIAGNOSIS — D631 Anemia in chronic kidney disease: Secondary | ICD-10-CM | POA: Diagnosis not present

## 2020-01-24 DIAGNOSIS — N170 Acute kidney failure with tubular necrosis: Secondary | ICD-10-CM | POA: Diagnosis not present

## 2020-01-24 DIAGNOSIS — Z823 Family history of stroke: Secondary | ICD-10-CM | POA: Diagnosis not present

## 2020-01-24 DIAGNOSIS — E7800 Pure hypercholesterolemia, unspecified: Secondary | ICD-10-CM | POA: Diagnosis not present

## 2020-01-24 DIAGNOSIS — Z9641 Presence of insulin pump (external) (internal): Secondary | ICD-10-CM | POA: Diagnosis present

## 2020-01-24 DIAGNOSIS — I129 Hypertensive chronic kidney disease with stage 1 through stage 4 chronic kidney disease, or unspecified chronic kidney disease: Secondary | ICD-10-CM | POA: Diagnosis present

## 2020-01-24 DIAGNOSIS — E872 Acidosis: Secondary | ICD-10-CM | POA: Diagnosis not present

## 2020-01-24 DIAGNOSIS — N179 Acute kidney failure, unspecified: Secondary | ICD-10-CM | POA: Diagnosis not present

## 2020-01-24 DIAGNOSIS — E876 Hypokalemia: Secondary | ICD-10-CM | POA: Diagnosis not present

## 2020-01-24 DIAGNOSIS — Z833 Family history of diabetes mellitus: Secondary | ICD-10-CM | POA: Diagnosis not present

## 2020-01-24 DIAGNOSIS — Z79899 Other long term (current) drug therapy: Secondary | ICD-10-CM | POA: Diagnosis not present

## 2020-01-24 DIAGNOSIS — E785 Hyperlipidemia, unspecified: Secondary | ICD-10-CM | POA: Diagnosis present

## 2020-01-24 DIAGNOSIS — N189 Chronic kidney disease, unspecified: Secondary | ICD-10-CM | POA: Diagnosis not present

## 2020-01-24 DIAGNOSIS — J449 Chronic obstructive pulmonary disease, unspecified: Secondary | ICD-10-CM | POA: Diagnosis not present

## 2020-01-24 DIAGNOSIS — E871 Hypo-osmolality and hyponatremia: Secondary | ICD-10-CM | POA: Diagnosis not present

## 2020-01-24 DIAGNOSIS — Z20822 Contact with and (suspected) exposure to covid-19: Secondary | ICD-10-CM | POA: Diagnosis present

## 2020-01-24 DIAGNOSIS — E10649 Type 1 diabetes mellitus with hypoglycemia without coma: Secondary | ICD-10-CM | POA: Diagnosis not present

## 2020-01-24 DIAGNOSIS — E875 Hyperkalemia: Secondary | ICD-10-CM | POA: Diagnosis present

## 2020-01-24 DIAGNOSIS — Z8249 Family history of ischemic heart disease and other diseases of the circulatory system: Secondary | ICD-10-CM | POA: Diagnosis not present

## 2020-01-24 DIAGNOSIS — I160 Hypertensive urgency: Secondary | ICD-10-CM | POA: Diagnosis not present

## 2020-01-24 DIAGNOSIS — E860 Dehydration: Secondary | ICD-10-CM | POA: Diagnosis present

## 2020-01-24 DIAGNOSIS — N184 Chronic kidney disease, stage 4 (severe): Secondary | ICD-10-CM | POA: Diagnosis not present

## 2020-01-24 DIAGNOSIS — Z6833 Body mass index (BMI) 33.0-33.9, adult: Secondary | ICD-10-CM | POA: Diagnosis not present

## 2020-01-24 DIAGNOSIS — G4733 Obstructive sleep apnea (adult) (pediatric): Secondary | ICD-10-CM | POA: Diagnosis not present

## 2020-01-24 DIAGNOSIS — E1069 Type 1 diabetes mellitus with other specified complication: Secondary | ICD-10-CM | POA: Diagnosis not present

## 2020-01-24 DIAGNOSIS — E1100 Type 2 diabetes mellitus with hyperosmolarity without nonketotic hyperglycemic-hyperosmolar coma (NKHHC): Secondary | ICD-10-CM | POA: Diagnosis present

## 2020-01-24 DIAGNOSIS — I10 Essential (primary) hypertension: Secondary | ICD-10-CM | POA: Diagnosis not present

## 2020-01-24 DIAGNOSIS — E669 Obesity, unspecified: Secondary | ICD-10-CM | POA: Diagnosis not present

## 2020-01-24 DIAGNOSIS — N2581 Secondary hyperparathyroidism of renal origin: Secondary | ICD-10-CM | POA: Diagnosis present

## 2020-01-24 DIAGNOSIS — T85694A Other mechanical complication of insulin pump, initial encounter: Secondary | ICD-10-CM | POA: Diagnosis not present

## 2020-01-24 DIAGNOSIS — Z794 Long term (current) use of insulin: Secondary | ICD-10-CM | POA: Diagnosis not present

## 2020-01-24 DIAGNOSIS — E1022 Type 1 diabetes mellitus with diabetic chronic kidney disease: Secondary | ICD-10-CM | POA: Diagnosis present

## 2020-01-24 DIAGNOSIS — E1122 Type 2 diabetes mellitus with diabetic chronic kidney disease: Secondary | ICD-10-CM | POA: Diagnosis not present

## 2020-01-24 DIAGNOSIS — E1065 Type 1 diabetes mellitus with hyperglycemia: Secondary | ICD-10-CM | POA: Diagnosis not present

## 2020-01-24 LAB — BASIC METABOLIC PANEL
Anion gap: 6 (ref 5–15)
BUN: 42 mg/dL — ABNORMAL HIGH (ref 6–20)
CO2: 22 mmol/L (ref 22–32)
Calcium: 8.5 mg/dL — ABNORMAL LOW (ref 8.9–10.3)
Chloride: 107 mmol/L (ref 98–111)
Creatinine, Ser: 3.79 mg/dL — ABNORMAL HIGH (ref 0.61–1.24)
GFR calc Af Amer: 21 mL/min — ABNORMAL LOW (ref >60)
GFR calc non Af Amer: 18 mL/min — ABNORMAL LOW (ref >60)
Glucose, Bld: 196 mg/dL — ABNORMAL HIGH (ref 70–99)
Potassium: 4.3 mmol/L (ref 3.5–5.1)
Sodium: 135 mmol/L (ref 135–145)

## 2020-01-24 LAB — CBC
HCT: 34 % — ABNORMAL LOW (ref 39.0–52.0)
Hemoglobin: 11.7 g/dL — ABNORMAL LOW (ref 13.0–17.0)
MCH: 29 pg (ref 26.0–34.0)
MCHC: 34.4 g/dL (ref 30.0–36.0)
MCV: 84.2 fL (ref 80.0–100.0)
Platelets: 251 10*3/uL (ref 150–400)
RBC: 4.04 MIL/uL — ABNORMAL LOW (ref 4.22–5.81)
RDW: 13.4 % (ref 11.5–15.5)
WBC: 5.3 10*3/uL (ref 4.0–10.5)
nRBC: 0 % (ref 0.0–0.2)

## 2020-01-24 LAB — GLUCOSE, CAPILLARY
Glucose-Capillary: 232 mg/dL — ABNORMAL HIGH (ref 70–99)
Glucose-Capillary: 155 mg/dL — ABNORMAL HIGH (ref 70–99)
Glucose-Capillary: 204 mg/dL — ABNORMAL HIGH (ref 70–99)
Glucose-Capillary: 362 mg/dL — ABNORMAL HIGH (ref 70–99)
Glucose-Capillary: 304 mg/dL — ABNORMAL HIGH (ref 70–99)

## 2020-01-24 MED ORDER — POLYETHYLENE GLYCOL 3350 17 G PO PACK
17.0000 g | PACK | Freq: Every day | ORAL | Status: DC
  Filled 2020-01-24 (×3): qty 1

## 2020-01-24 MED ORDER — BISACODYL 5 MG PO TBEC: 5.0000 mg | DELAYED_RELEASE_TABLET | Freq: Every day | ORAL | Status: DC | PRN

## 2020-01-24 MED ORDER — DOCUSATE SODIUM 100 MG PO CAPS
100.0000 mg | ORAL_CAPSULE | Freq: Two times a day (BID) | ORAL | Status: DC
  Administered 2020-01-24 – 2020-01-28 (×6): 100 mg via ORAL
  Filled 2020-01-24 (×7): qty 1

## 2020-01-24 MED ORDER — METOCLOPRAMIDE HCL 5 MG/ML IJ SOLN
10.0000 mg | Freq: Four times a day (QID) | INTRAMUSCULAR | Status: DC | PRN
  Administered 2020-01-24 – 2020-01-27 (×5): 10 mg via INTRAVENOUS
  Filled 2020-01-24 (×5): qty 2

## 2020-01-24 NOTE — Progress Notes (Signed)
[facility]  ROUNDING NOTE   Subjective:  Serum glucose has improved. Renal function also improved his creatinine down to 3.7 with an EGFR of 18.  Objective:  Vital signs in last 24 hours:  Temp:  [97.9 F (36.6 C)-98.2 F (36.8 C)] 97.9 F (36.6 C) (04/22 0745) Pulse Rate:  [80-98] 89 (04/22 0745) Resp:  [14-19] 18 (04/22 0745) BP: (130-211)/(70-117) 141/75 (04/22 0745) SpO2:  [93 %-100 %] 97 % (04/22 0745) Weight:  [938 kg] 111 kg (04/21 2018)  Weight change:  Filed Weights   01/23/20 0855 01/23/20 0904 01/23/20 2018  Weight: 113.4 kg 113.4 kg 111 kg    Intake/Output: No intake/output data recorded.   Intake/Output this shift:  No intake/output data recorded.  Physical Exam: General: No acute distress  Head: Normocephalic, atraumatic. Moist oral mucosal membranes  Eyes: Anicteric  Neck: Supple, trachea midline  Lungs:  Clear to auscultation, normal effort  Heart: S1S2 no rubs  Abdomen:  Soft, nontender, bowel sounds present  Extremities: 1+ peripheral edema.  Neurologic: Awake, alert, following commands  Skin: No lesions       Basic Metabolic Panel: Recent Labs  Lab 01/23/20 0921 01/23/20 0921 01/23/20 1024 01/23/20 1459 01/24/20 0509  NA 126*  --  130* 136 135  K 6.2*  --  5.8* 4.5 4.3  CL 98  --  101 107 107  CO2 22  --  23 21* 22  GLUCOSE 562*  --  493* 141* 196*  BUN 49*  --  49* 43* 42*  CREATININE 4.11*  --  4.20* 3.99* 3.79*  CALCIUM 8.1*   < > 8.3* 8.5* 8.5*   < > = values in this interval not displayed.    Liver Function Tests: Recent Labs  Lab 01/23/20 0921  AST 24  ALT 21  ALKPHOS 85  BILITOT 0.8  PROT 5.9*  ALBUMIN 2.5*   No results for input(s): LIPASE, AMYLASE in the last 168 hours. No results for input(s): AMMONIA in the last 168 hours.  CBC: Recent Labs  Lab 01/23/20 0921 01/24/20 0509  WBC 6.9 5.3  NEUTROABS 4.0  --   HGB 11.9* 11.7*  HCT 36.0* 34.0*  MCV 87.0 84.2  PLT 285 251    Cardiac  Enzymes: No results for input(s): CKTOTAL, CKMB, CKMBINDEX, TROPONINI in the last 168 hours.  BNP: Invalid input(s): POCBNP  CBG: Recent Labs  Lab 01/23/20 2021 01/23/20 2333 01/24/20 0203 01/24/20 0746 01/24/20 1125  GLUCAP 340* 257* 232* 155* 204*    Microbiology: Results for orders placed or performed during the hospital encounter of 01/23/20  Respiratory Panel by RT PCR (Flu A&B, Covid) - Nasopharyngeal Swab     Status: None   Collection Time: 01/23/20 12:18 PM   Specimen: Nasopharyngeal Swab  Result Value Ref Range Status   SARS Coronavirus 2 by RT PCR NEGATIVE NEGATIVE Final    Comment: (NOTE) SARS-CoV-2 target nucleic acids are NOT DETECTED. The SARS-CoV-2 RNA is generally detectable in upper respiratoy specimens during the acute phase of infection. The lowest concentration of SARS-CoV-2 viral copies this assay can detect is 131 copies/mL. A negative result does not preclude SARS-Cov-2 infection and should not be used as the sole basis for treatment or other patient management decisions. A negative result may occur with  improper specimen collection/handling, submission of specimen other than nasopharyngeal swab, presence of viral mutation(s) within the areas targeted by this assay, and inadequate number of viral copies (<131 copies/mL). A negative result must be  combined with clinical observations, patient history, and epidemiological information. The expected result is Negative. Fact Sheet for Patients:  PinkCheek.be Fact Sheet for Healthcare Providers:  GravelBags.it This test is not yet ap proved or cleared by the [place] FDA and  has been authorized for detection and/or diagnosis of SARS-CoV-2 by FDA under an Emergency Use Authorization (EUA). This EUA will remain  in effect (meaning this test can be used) for the duration of the COVID-19 declaration under Section 564(b)(1) of the Act, 21  U.S.C. section 360bbb-3(b)(1), unless the authorization is terminated or revoked sooner.    Influenza A by PCR NEGATIVE NEGATIVE Final   Influenza B by PCR NEGATIVE NEGATIVE Final    Comment: (NOTE) The Xpert Xpress SARS-CoV-2/FLU/RSV assay is intended as an aid in  the diagnosis of influenza from Nasopharyngeal swab specimens and  should not be used as a sole basis for treatment. Nasal washings and  aspirates are unacceptable for Xpert Xpress SARS-CoV-2/FLU/RSV  testing. Fact Sheet for Patients: PinkCheek.be Fact Sheet for Healthcare Providers: GravelBags.it This test is not yet approved or cleared by the [place] FDA and  has been authorized for detection and/or diagnosis of SARS-CoV-2 by  FDA under an Emergency Use Authorization (EUA). This EUA will remain  in effect (meaning this test can be used) for the duration of the  Covid-19 declaration under Section 564(b)(1) of the Act, 21  U.S.C. section 360bbb-3(b)(1), unless the authorization is  terminated or revoked. Performed at [facility], [address] [place], [place] [address]     Coagulation Studies: No results for input(s): LABPROT, INR in the last 72 hours.  Urinalysis: Recent Labs    01/23/20 0922  COLORURINE STRAW*  LABSPEC 1.011  PHURINE 7.0  GLUCOSEU >=500*  HGBUR SMALL*  BILIRUBINUR NEGATIVE  KETONESUR NEGATIVE  PROTEINUR >=300*  NITRITE NEGATIVE  LEUKOCYTESUR NEGATIVE      Imaging: No results found.   Medications:   . sodium chloride 100 mL/hr at 01/24/20 0846   . amLODipine  10 mg Oral Daily  . aspirin EC  81 mg Oral QODAY  . atorvastatin  80 mg Oral QPM  . carvedilol  12.5 mg Oral Daily  . heparin  5,000 Units Subcutaneous Q8H  . hydrALAZINE  50 mg Oral Q8H  . insulin pump   Subcutaneous TID WC, HS, 0200  . promethazine  6.25 mg Intravenous Once   acetaminophen, dextrose, hydrALAZINE, ondansetron (ZOFRAN) IV,  pantoprazole, traMADol  Assessment/ Plan:  49 y.o. male with past medical history of chronic kidney disease stage IV, hypertension, hyperlipidemia, history of TIA, diabetes mellitus type 1, anemia of chronic kidney disease, secondary hyperparathyroidism who presents now hyperosmolar and hyperglycemic state.  1.  Acute kidney injury/chronic kidney disease stage IV baseline EGFR 24/diabetes mellitus type 2 with chronic kidney disease..  Creatinine down to 3.79 with an EGFR of 18.  Maintain the patient on IV fluid hydration for now.  Hopefully he will get back down to his prior renal function baseline.  Counseled the patient on the importance of follow-up for his underlying chronic kidney disease.  2.  Anemia of chronic kidney disease.  Hemoglobin down slightly to 11.7.  Continue to monitor.  No indication for Epogen..  3.  Hypertension.  Continue amlodipine, hydralazine, and carvedilol.   LOS: 0 Johnette Teigen 4/22/20211:55 PM

## 2020-01-24 NOTE — Progress Notes (Signed)
Inpatient Diabetes Program Recommendations  AACE/ADA: New Consensus Statement on Inpatient Glycemic Control (2015)  Target Ranges:  Prepandial:   less than 140 mg/dL      Peak postprandial:   less than 180 mg/dL (1-2 hours)      Critically ill patients:  140 - 180 mg/dL   Lab Results  Component Value Date   GLUCAP 204 (H) 01/24/2020   HGBA1C 10.5 (H) 01/23/2020    Inpatient Diabetes Program Recommendations:   Spoke with patient and wife @ bedside discussing diabetes management. Both said they know they need to start eating better. Discussed options for healthy eating and answered questions.  Spoke with pt and wife @ bedside regarding A1C results 10.5 (average blood glucose 255 over the past 2-3 months) and explained what an A1C is, basic pathophysiology of DM, basic home care, basic diabetes diet nutrition principles, importance of checking CBGs and maintaining good CBG control to prevent long-term and short-term complications. Reviewed signs and symptoms of hyperglycemia and hypoglycemia and how to treat hypoglycemia at home. Also reviewed blood sugar goals at home.  RNs to provide ongoing basic DM education at bedside with this patient.   Patient restarted his insulin pump after admission with new insulin injection site and CBGs improved. Reviewed with patient again trouble shooting if CBGs elevate and correction isn't working. Patient plans to keep an extra insertion set with him.  Thank you, Nani Gasser. Vonna Brabson, RN, MSN, CDE  Diabetes Coordinator Inpatient Glycemic Control Team Team Pager [phone] (8am-5pm) 01/24/2020 2:24 PM

## 2020-01-24 NOTE — Progress Notes (Signed)
PROGRESS NOTE    Patient: [name]                            PCP: [name], MD                    DOB: Oct 24, 1970            DOA: 01/23/2020 [id number]             DOS: 01/24/2020, 10:46 AM   LOS: 0 days   Date of Service: The patient was seen and examined on 01/24/2020  Subjective:   The patient was seen and examined this morning, stable laying in bed. Still wearing his insulin pump... Reporting feeling much better since admission. No major issues overnight  Brief Narrative:   [name] is a 49 y.o. male  with PMH of HTN,HLP, diabetes mellitus type 1 on insulin pump, CKD stage IV, PVCs, TIAs, presented with generalized weakness and abnormal labs Patient was found in accelerated hypertensive state, hyperglycemia, hyper osmotic state, with acute on chronic kidney failure.  Patient subsequently admitted started on aggressive IV fluid hydration, insulin drip subsequently was tapered down blood sugar improved.  P.o. IV medication initiated for his high blood pressure, blood pressure has improved.  BUN/creatinine monitored closely, slowly improving    Assessment & Plan:   Principal Problem:   Type 1 diabetes mellitus with hyperosmolar hyperglycemic state (HHS) ([place]) Active Problems:   Insulin dependent type 1 diabetes mellitus (HCC)   Hypercholesteremia   HTN (hypertension)   TIA (transient ischemic attack)   Acute renal failure superimposed on stage 4 chronic kidney disease (HCC)   Hypertensive urgency   Hyperkalemia   Hyperosmolar hyperglycemic state (HHS) (HCC)    Tpe 1 diabetes mellitus with hyperosmolar hyperglycemic state (HHS) ([place]): -Much improved on admission blood sugar was high as 562, 493, 1 anion gap of 6 Urine negative for ketones,  beta hydroxybutyric acid 0.94. pt was treated with insulin gtt initially.  Blood sugar improved to 120-140s.  -This morning-: CBGs 340, 257, 232, 155 now   -Status post IVF: 2L NS-we will  continue to patient on maintenance normal saline at  - insulin gtt is d/c'ed  - start insulin pump  Insulin dependent type 1 diabetes mellitus ([place]): Uncontrolled:  Most recent A1c 10.2, A1c today 10.3 poorly controled.  Patient is on insulin pump at home -restarted insulin pump--- insulin will be adjusted for tighter blood sugar control -Last : CBGs 340, 257, 232, 155 now   Hypercholesteremia: -Lipitor   HTN (hypertension) and Hypertensive urgency:  Bp 223/103 -->157/73 -Blood pressures improved >> 130/79 this morning -pt was given 10 mg of IV labetalol -As needed IV hydralazine -Continue home: Amlodipine, Coreg -He was started on p.o. hydralazine 50 mg twice daily to 3 times daily  TIA (transient ischemic attack) -ASA and lipitor -Stable  Acute renal failure superimposed on stage 4 chronic kidney disease ([place]):  Baseline creatinine 2.6-3.0 recently.  Creatinine on admission 4.2 >>> 3.99>>> 3.79 On admission BUN 49, most likely due to dehydration. -Continue IV fluids, monitoring kidney function closely -renal, Dr. Holley Raring is consulted--- appreciate his input  Hyperkalemia:  Potassium on admission, 5.8 >>> 4.5 >> 4.3 -Continue gentle IV fluid hydration -5 g of Lokelma is given on admission    Nutritional status:         -------------------------------------------------------------------------------------------------------------------------  DVT prophylaxis: Heparin SQ Code Status:   Code  Status: Full Code Family Communication: No family member present at bedside- attempt will be made to update daily The above findings and plan of care has been discussed with patient (and family )  in detail,  they expressed understanding and agreement of above. -Advance care planning has been discussed.   Admission status:   Status is: Inpatient  Remains inpatient appropriate because:IV treatments appropriate due to intensity of illness or inability to take PO    Dispo: The patient is from: Home              Anticipated d/c is to: Home              Anticipated d/c date is: 2 days              Patient currently is not medically stable to d/c.         Consultants: Nephrology  Procedures:   No admission procedures for hospital encounter.     Antimicrobials:  Anti-infectives (From admission, onward)   None       Medication:  . amLODipine  10 mg Oral Daily  . aspirin EC  81 mg Oral QODAY  . atorvastatin  80 mg Oral QPM  . carvedilol  12.5 mg Oral Daily  . heparin  5,000 Units Subcutaneous Q8H  . hydrALAZINE  50 mg Oral Q8H  . insulin pump   Subcutaneous TID WC, HS, 0200  . promethazine  6.25 mg Intravenous Once    acetaminophen, dextrose, hydrALAZINE, ondansetron (ZOFRAN) IV, pantoprazole, traMADol   Objective:   Vitals:   01/23/20 2018 01/23/20 2334 01/24/20 0454 01/24/20 0745  BP: (!) 175/70 (!) 164/90 130/79 (!) 141/75  Pulse: 97 98 80 89  Resp: 19 18 17 18   Temp: 98.2 F (36.8 C) 98.1 F (36.7 C)  97.9 F (36.6 C)  TempSrc: Oral Oral  Oral  SpO2: 100% 96% 98% 97%  Weight: 111 kg     Height: 6' (1.829 m)      No intake or output data in the 24 hours ending 01/24/20 1046 Filed Weights   01/23/20 0855 01/23/20 0904 01/23/20 2018  Weight: 113.4 kg 113.4 kg 111 kg     Examination:   Physical Exam  Constitution:  Alert, cooperative, no distress,  Appears calm and comfortable  Psychiatric: Normal and stable mood and affect, cognition intact,   HEENT: Normocephalic, PERRL, otherwise with in Normal limits  Chest:Chest symmetric Cardio vascular:  S1/S2, RRR, No murmure, No Rubs or Gallops  pulmonary: Clear to auscultation bilaterally, respirations unlabored, negative wheezes / crackles Abdomen: Soft, non-tender, non-distended, bowel sounds,no masses, no organomegaly Muscular skeletal: Limited exam - in bed, able to move all 4 extremities, Normal strength,  Neuro: CNII-XII intact. , normal motor and sensation,  reflexes intact  Extremities: No pitting edema lower extremities, +2 pulses  Skin: Dry, warm to touch, negative for any Rashes, No open wounds Wounds: per nursing documentation               LABs:  CBC Latest Ref Rng & Units 01/24/2020 01/23/2020 10/10/2019  WBC 4.0 - 10.5 K/uL 5.3 6.9 9.7  Hemoglobin 13.0 - 17.0 g/dL 11.7(L) 11.9(L) 12.2(L)  Hematocrit 39.0 - 52.0 % 34.0(L) 36.0(L) 36.8(L)  Platelets 150 - 400 K/uL 251 285 592(H)   CMP Latest Ref Rng & Units 01/24/2020 01/23/2020 01/23/2020  Glucose 70 - 99 mg/dL 196(H) 141(H) 493(H)  BUN 6 - 20 mg/dL 42(H) 43(H) 49(H)  Creatinine 0.61 - 1.24 mg/dL 3.79(H) 3.99(H)  4.20(H)  Sodium 135 - 145 mmol/L 135 136 130(L)  Potassium 3.5 - 5.1 mmol/L 4.3 4.5 5.8(H)  Chloride 98 - 111 mmol/L 107 107 101  CO2 22 - 32 mmol/L 22 21(L) 23  Calcium 8.9 - 10.3 mg/dL 8.5(L) 8.5(L) 8.3(L)  Total Protein 6.5 - 8.1 g/dL - - -  Total Bilirubin 0.3 - 1.2 mg/dL - - -  Alkaline Phos 38 - 126 U/L - - -  AST 15 - 41 U/L - - -  ALT 0 - 44 U/L - - -        SIGNED: Deatra James, MD, FACP, FHM. [facility],  Pager [phone] ([email or web address] [email or web address])  If 7PM-7AM, please contact night-coverage [email or web address] 01/24/2020, 10:46 AM

## 2020-01-25 LAB — BASIC METABOLIC PANEL
Anion gap: 6 (ref 5–15)
BUN: 43 mg/dL — ABNORMAL HIGH (ref 6–20)
CO2: 20 mmol/L — ABNORMAL LOW (ref 22–32)
Calcium: 7.9 mg/dL — ABNORMAL LOW (ref 8.9–10.3)
Chloride: 105 mmol/L (ref 98–111)
Creatinine, Ser: 4.21 mg/dL — ABNORMAL HIGH (ref 0.61–1.24)
GFR calc Af Amer: 18 mL/min — ABNORMAL LOW (ref >60)
GFR calc non Af Amer: 16 mL/min — ABNORMAL LOW (ref >60)
Glucose, Bld: 377 mg/dL — ABNORMAL HIGH (ref 70–99)
Potassium: 4.5 mmol/L (ref 3.5–5.1)
Sodium: 131 mmol/L — ABNORMAL LOW (ref 135–145)

## 2020-01-25 LAB — GLUCOSE, CAPILLARY
Glucose-Capillary: 340 mg/dL — ABNORMAL HIGH (ref 70–99)
Glucose-Capillary: 338 mg/dL — ABNORMAL HIGH (ref 70–99)
Glucose-Capillary: 395 mg/dL — ABNORMAL HIGH (ref 70–99)
Glucose-Capillary: 250 mg/dL — ABNORMAL HIGH (ref 70–99)
Glucose-Capillary: 195 mg/dL — ABNORMAL HIGH (ref 70–99)

## 2020-01-25 LAB — MAGNESIUM: Magnesium: 2.2 mg/dL (ref 1.7–2.4)

## 2020-01-25 LAB — CBC
HCT: 34.7 % — ABNORMAL LOW (ref 39.0–52.0)
Hemoglobin: 11.2 g/dL — ABNORMAL LOW (ref 13.0–17.0)
MCH: 28.7 pg (ref 26.0–34.0)
MCHC: 32.3 g/dL (ref 30.0–36.0)
MCV: 89 fL (ref 80.0–100.0)
Platelets: 254 10*3/uL (ref 150–400)
RBC: 3.9 MIL/uL — ABNORMAL LOW (ref 4.22–5.81)
RDW: 13.6 % (ref 11.5–15.5)
WBC: 6.4 10*3/uL (ref 4.0–10.5)
nRBC: 0 % (ref 0.0–0.2)

## 2020-01-25 MED ORDER — INSULIN DETEMIR 100 UNIT/ML ~~LOC~~ SOLN
10.0000 [IU] | Freq: Every day | SUBCUTANEOUS | Status: DC
  Filled 2020-01-25: qty 0.1

## 2020-01-25 MED ORDER — CARVEDILOL 25 MG PO TABS
25.0000 mg | ORAL_TABLET | Freq: Every day | ORAL | Status: DC
  Administered 2020-01-26 – 2020-01-28 (×3): 25 mg via ORAL
  Filled 2020-01-25 (×3): qty 1

## 2020-01-25 MED ORDER — INSULIN DETEMIR 100 UNIT/ML ~~LOC~~ SOLN
20.0000 [IU] | Freq: Every day | SUBCUTANEOUS | Status: DC
  Administered 2020-01-25 – 2020-01-26 (×2): 20 [IU] via SUBCUTANEOUS
  Filled 2020-01-25 (×2): qty 0.2

## 2020-01-25 MED ORDER — INSULIN ASPART 100 UNIT/ML ~~LOC~~ SOLN
0.0000 [IU] | Freq: Three times a day (TID) | SUBCUTANEOUS | Status: DC
  Administered 2020-01-25: 12:00:00 9 [IU] via SUBCUTANEOUS
  Administered 2020-01-25: 17:00:00 3 [IU] via SUBCUTANEOUS
  Administered 2020-01-25: 7 [IU] via SUBCUTANEOUS
  Administered 2020-01-26: 08:00:00 9 [IU] via SUBCUTANEOUS
  Filled 2020-01-25 (×4): qty 1

## 2020-01-25 MED ORDER — INSULIN ASPART 100 UNIT/ML ~~LOC~~ SOLN: 0.0000 [IU] | Freq: Every day | SUBCUTANEOUS | Status: DC

## 2020-01-25 MED ORDER — INSULIN ASPART 100 UNIT/ML ~~LOC~~ SOLN
6.0000 [IU] | Freq: Three times a day (TID) | SUBCUTANEOUS | Status: DC
  Administered 2020-01-25 – 2020-01-27 (×6): 6 [IU] via SUBCUTANEOUS
  Filled 2020-01-25 (×6): qty 1

## 2020-01-25 NOTE — Progress Notes (Signed)
Results for BALRAJ, BRAYFIELD (MRN [id number]) as of 01/25/2020 09:12  Ref. Range 01/24/2020 07:46 01/24/2020 11:25 01/24/2020 16:35 01/24/2020 21:12  Glucose-Capillary Latest Ref Range: 70 - 99 mg/dL 155 (H) 204 (H) 362 (H) 304 (H)   Results for DAEL, HOWLAND (MRN [id number]) as of 01/25/2020 09:12  Ref. Range 01/25/2020 02:23 01/25/2020 07:42  Glucose-Capillary Latest Ref Range: 70 - 99 mg/dL 340 (H) 338 (H)     Home DM Meds: Insulin Pump  Current Orders: Insulin Pump      Levemir 20 units Daily      Novolog Sensitive Correction Scale/ SSI (0-9 units) TID AC + HS    MD- Pt requesting orders for Novolog Meal Coverage:  Please consider placing orders for Novolog 6 units TID with meals   Pt takes about 1 unit Novolog for every 9.5 grams Crabs at hoe with his insulin pump     Pt restarted home insulin pump on PM of 04/21.  CBGs became progressively higher throughout the day yesterday.  RN called MD this AM to ask to switch pt to Levemir and Novolog SQ this AM per pt request.  Called pt by phone this AM--Pt told me he was concerned he may be having issues with either his insertion site, the tubing, or his actual pump.  Pt asked the RN this AM to call the MD and switch him back to SQ Levemir and Novolog.  Pt told me he would prefer to just get shots today and then will restart his home insulin pump with all new supplies when he goes home.  Pt stated he plans to call his ENDO Dr. Gabriel Carina asap after d/c to set up a follow-up appt.  Discussed with pt that Levemir lasts 24 hours and that when he resumes his insulin pump he may need to suspend his basal rates until 24 hours after last Levemir dose given.  Pt stated understanding and states he can do this.  Pt had concerns about not having orders for Novolog food coverage.  I told pt I would reach out to the MD and ask for carbohydrate coverage as well.  Explained to pt that he will get 20 units Levemir this AM as soon as the dose arrives from  pharmacy.  Pt agreeable to this plan.  Sect Secure Chat to Dr. Roger Shelter requesting Novolog 6 units TID with meals as well.    --Will follow patient during hospitalization--  Wyn Quaker RN, MSN, CDE Diabetes Coordinator Inpatient Glycemic Control Team Team Pager: [phone] (8a-5p)

## 2020-01-25 NOTE — Progress Notes (Signed)
PROGRESS NOTE    Patient: [name]                            PCP: [name], MD                    DOB: [birth year]            DOA: 01/23/2020 PNT:[id number]             DOS: 01/25/2020, 12:06 PM   LOS: 1 day   Date of Service: The patient was seen and examined on 01/25/2020  Subjective:   The patient was seen and examined this morning, stable in no acute distress, upset as he thinks his pump is not working, his blood sugars climbing up again. He voluntarily took his pump off, he is agreeable with CBG checks, and sliding scale coverage at this time. Noted for worsening creatinine today  Brief Narrative:   TIYON SANOR is a 49 y.o. male  with PMH of HTN,HLP, diabetes mellitus type 1 on insulin pump, CKD stage IV, PVCs, TIAs, presented with generalized weakness and abnormal labs Patient was found in accelerated hypertensive state, hyperglycemia, hyper osmotic state, with acute on chronic kidney failure.  Patient subsequently admitted started on aggressive IV fluid hydration, insulin drip subsequently was tapered down blood sugar improved.  P.o. IV medication initiated for his high blood pressure, blood pressure has improved.  BUN/creatinine monitored closely, slowly improving    Assessment & Plan:   Principal Problem:   Type 1 diabetes mellitus with hyperosmolar hyperglycemic state (HHS) ([place]) Active Problems:   Insulin dependent type 1 diabetes mellitus (HCC)   Hypercholesteremia   HTN (hypertension)   TIA (transient ischemic attack)   Acute renal failure superimposed on stage 4 chronic kidney disease (HCC)   Hypertensive urgency   Hyperkalemia   Hyperosmolar hyperglycemic state (HHS) (HCC)    Tpe 1 diabetes mellitus with hyperosmolar hyperglycemic state (HHS) ([place]): -HHS resolved, but patient pump seem to be malfunctioning, therefore was DC'd -Initiating basal insulin of Levemir 20 units -We will initiate checking CBGs before every meal at  bedtime, with SSI coverage -Last CBGs this morning 362, 304, 340, 338  -on admission blood sugar was high as 562, 493, 1 anion gap of 6 Urine negative for ketones,  beta hydroxybutyric acid 0.94. pt was treated with insulin gtt initially.  Blood sugar improved to 120-140s.    -Status post IVF: 2L NS-we will continue to patient on maintenance normal saline at  - insulin gtt is d/c'ed  -Insulin pump DC'd    Acute renal failure superimposed on stage 4 chronic kidney disease ([place]):  Baseline creatinine 2.6-3.0 recently.  Creatinine on admission 4.2 >>> 3.99>>> 3.79 >>> back up to 4.21 today On admission BUN 49 >> 43 today  May have been contributed with dehydration, HHS -Continue IV fluids, monitoring kidney function closely -renal, Dr. Holley Raring following, appreciate his input   Insulin dependent type 1 diabetes mellitus ([place]): Uncontrolled:  Most recent A1c 10.2, A1c today 10.3 poorly controled. -It appears the patient's insulin pump not functioning right -As above,  initiated basal insulin, CBG, SSI coverage  Hypercholesteremia: -Lipitor - stable    HTN (hypertension) and Hypertensive urgency:  Bp 223/103 -->157/73 >> 154/78 -Blood pressures improved >> 130/79 this morning -pt was given 10 mg of IV labetalol -As needed IV hydralazine -Continue home: Amlodipine,  -We will increase his Coreg 12.5 - to-  25 mg daily  -He was started on p.o. hydralazine 50 mg twice daily to 3 times daily  TIA (transient ischemic attack) -ASA and lipitor -Stable  Hyperkalemia:  Potassium on admission, 5.8 >>> 4.5 >> 4.3 -Continue gentle IV fluid hydration -5 g of Lokelma is given on admission    Nutritional status:         -------------------------------------------------------------------------------------------------------------------------  DVT prophylaxis: Heparin SQ Code Status:   Code Status: Full Code Family Communication: No family member present at bedside-  attempt will be made to update daily The above findings and plan of care has been discussed with patient (and family )  in detail,  they expressed understanding and agreement of above. -Advance care planning has been discussed.   Admission status:   Status is: Inpatient  Remains inpatient appropriate because:IV treatments appropriate due to intensity of illness or inability to take PO   Dispo: The patient is from: Home              Anticipated d/c is to: Home              Anticipated d/c date is: 2 days              Patient currently is not medically stable to d/c.         Consultants: Nephrology  Procedures:   No admission procedures for hospital encounter.     Antimicrobials:  Anti-infectives (From admission, onward)   None       Medication:  . amLODipine  10 mg Oral Daily  . aspirin EC  81 mg Oral QODAY  . atorvastatin  80 mg Oral QPM  . carvedilol  12.5 mg Oral Daily  . docusate sodium  100 mg Oral BID  . heparin  5,000 Units Subcutaneous Q8H  . hydrALAZINE  50 mg Oral Q8H  . insulin aspart  0-5 Units Subcutaneous QHS  . insulin aspart  0-9 Units Subcutaneous TID WC  . insulin detemir  20 Units Subcutaneous Daily  . polyethylene glycol  17 g Oral Daily  . promethazine  6.25 mg Intravenous Once    acetaminophen, bisacodyl, dextrose, hydrALAZINE, metoCLOPramide (REGLAN) injection, ondansetron (ZOFRAN) IV, pantoprazole   Objective:   Vitals:   01/25/20 0347 01/25/20 0605 01/25/20 0741 01/25/20 1023  BP: (!) 146/74 135/75 (!) 144/71 (!) 154/78  Pulse: 89 97 96 95  Resp: 16  20   Temp: 98.7 F (37.1 C)  98.9 F (37.2 C)   TempSrc: Oral  Oral   SpO2: 96%  94%   Weight:      Height:        Intake/Output Summary (Last 24 hours) at 01/25/2020 1206 Last data filed at 01/24/2020 1500 Gross per 24 hour  Intake 2183.32 ml  Output --  Net 2183.32 ml   Filed Weights   01/23/20 0855 01/23/20 0904 01/23/20 2018  Weight: 113.4 kg 113.4 kg 111 kg      Examination:   Physical Exam  Constitution:  Alert, cooperative, no distress,  Psychiatric: Normal and stable mood and affect, cognition intact,   HEENT: Normocephalic, PERRL, otherwise with in Normal limits  Chest:Chest symmetric Cardio vascular:  S1/S2, RRR, No murmure, No Rubs or Gallops  pulmonary: Clear to auscultation bilaterally, respirations unlabored, negative wheezes / crackles Abdomen: Soft, non-tender, non-distended, bowel sounds,no masses, no organomegaly Muscular skeletal: Limited exam - in bed, able to move all 4 extremities, Normal strength,  Neuro: CNII-XII intact. , normal motor and sensation, reflexes intact  Extremities:  No pitting edema lower extremities, +2 pulses  Skin: Dry, warm to touch, negative for any Rashes, No open wounds Wounds: per nursing documentation        LABs:  CBC Latest Ref Rng & Units 01/25/2020 01/24/2020 01/23/2020  WBC 4.0 - 10.5 K/uL 6.4 5.3 6.9  Hemoglobin 13.0 - 17.0 g/dL 11.2(L) 11.7(L) 11.9(L)  Hematocrit 39.0 - 52.0 % 34.7(L) 34.0(L) 36.0(L)  Platelets 150 - 400 K/uL 254 251 285   CMP Latest Ref Rng & Units 01/25/2020 01/24/2020 01/23/2020  Glucose 70 - 99 mg/dL 377(H) 196(H) 141(H)  BUN 6 - 20 mg/dL 43(H) 42(H) 43(H)  Creatinine 0.61 - 1.24 mg/dL 4.21(H) 3.79(H) 3.99(H)  Sodium 135 - 145 mmol/L 131(L) 135 136  Potassium 3.5 - 5.1 mmol/L 4.5 4.3 4.5  Chloride 98 - 111 mmol/L 105 107 107  CO2 22 - 32 mmol/L 20(L) 22 21(L)  Calcium 8.9 - 10.3 mg/dL 7.9(L) 8.5(L) 8.5(L)  Total Protein 6.5 - 8.1 g/dL - - -  Total Bilirubin 0.3 - 1.2 mg/dL - - -  Alkaline Phos 38 - 126 U/L - - -  AST 15 - 41 U/L - - -  ALT 0 - 44 U/L - - -        SIGNED: Deatra James, MD, FACP, FHM. [facility],  Pager [phone] (please [email or web address] to [email or web address])  If 7PM-7AM, please contact night-coverage [email or web address] 01/25/2020, 12:06 PM

## 2020-01-25 NOTE — Progress Notes (Signed)
Pt expressing his concern that sugars are still elevated and feels "that his insulin pump isn't working well" and is requesting we give him insulin. MD notified about pts request and is placing orders. Pt removed personal insulin pump.

## 2020-01-25 NOTE — Progress Notes (Signed)
[facility]  ROUNDING NOTE   Subjective:  Renal function actually worse today. Creatinine up to 4.2. Blood glucose early this a.m. was high at 377.  Objective:  Vital signs in last 24 hours:  Temp:  [97.8 F (36.6 C)-98.9 F (37.2 C)] 97.8 F (36.6 C) (04/23 1412) Pulse Rate:  [81-98] 81 (04/23 1412) Resp:  [16-20] 18 (04/23 1412) BP: (134-154)/(52-88) 134/71 (04/23 1412) SpO2:  [94 %-99 %] 99 % (04/23 1412)  Weight change:  Filed Weights   01/23/20 0855 01/23/20 0904 01/23/20 2018  Weight: 113.4 kg 113.4 kg 111 kg    Intake/Output: I/O last 3 completed shifts: In: 2183.3 [I.V.:2183.3] Out: -    Intake/Output this shift:  Total I/O In: 240 [P.O.:240] Out: -   Physical Exam: General: No acute distress  Head: Normocephalic, atraumatic. Moist oral mucosal membranes  Eyes: Anicteric  Neck: Supple, trachea midline  Lungs:  Clear to auscultation, normal effort  Heart: S1S2 no rubs  Abdomen:  Soft, nontender, bowel sounds present  Extremities: 1+ peripheral edema.  Neurologic: Awake, alert, following commands  Skin: No lesions       Basic Metabolic Panel: Recent Labs  Lab 01/23/20 0921 01/23/20 0921 01/23/20 1024 01/23/20 1024 01/23/20 1459 01/24/20 0509 01/25/20 0340  NA 126*  --  130*  --  136 135 131*  K 6.2*  --  5.8*  --  4.5 4.3 4.5  CL 98  --  101  --  107 107 105  CO2 22  --  23  --  21* 22 20*  GLUCOSE 562*  --  493*  --  141* 196* 377*  BUN 49*  --  49*  --  43* 42* 43*  CREATININE 4.11*  --  4.20*  --  3.99* 3.79* 4.21*  CALCIUM 8.1*   < > 8.3*   < > 8.5* 8.5* 7.9*  MG  --   --   --   --   --   --  2.2   < > = values in this interval not displayed.    Liver Function Tests: Recent Labs  Lab 01/23/20 0921  AST 24  ALT 21  ALKPHOS 85  BILITOT 0.8  PROT 5.9*  ALBUMIN 2.5*   No results for input(s): LIPASE, AMYLASE in the last 168 hours. No results for input(s): AMMONIA in the last 168 hours.  CBC: Recent Labs  Lab  01/23/20 0921 01/24/20 0509 01/25/20 0340  WBC 6.9 5.3 6.4  NEUTROABS 4.0  --   --   HGB 11.9* 11.7* 11.2*  HCT 36.0* 34.0* 34.7*  MCV 87.0 84.2 89.0  PLT 285 251 254    Cardiac Enzymes: No results for input(s): CKTOTAL, CKMB, CKMBINDEX, TROPONINI in the last 168 hours.  BNP: Invalid input(s): POCBNP  CBG: Recent Labs  Lab 01/24/20 1635 01/24/20 2112 01/25/20 0223 01/25/20 0742 01/25/20 1145  GLUCAP 362* 304* 340* 338* 395*    Microbiology: Results for orders placed or performed during the hospital encounter of 01/23/20  Respiratory Panel by RT PCR (Flu A&B, Covid) - Nasopharyngeal Swab     Status: None   Collection Time: 01/23/20 12:18 PM   Specimen: Nasopharyngeal Swab  Result Value Ref Range Status   SARS Coronavirus 2 by RT PCR NEGATIVE NEGATIVE Final    Comment: (NOTE) SARS-CoV-2 target nucleic acids are NOT DETECTED. The SARS-CoV-2 RNA is generally detectable in upper respiratoy specimens during the acute phase of infection. The lowest concentration of SARS-CoV-2 viral copies this  assay can detect is 131 copies/mL. A negative result does not preclude SARS-Cov-2 infection and should not be used as the sole basis for treatment or other patient management decisions. A negative result may occur with  improper specimen collection/handling, submission of specimen other than nasopharyngeal swab, presence of viral mutation(s) within the areas targeted by this assay, and inadequate number of viral copies (<131 copies/mL). A negative result must be combined with clinical observations, patient history, and epidemiological information. The expected result is Negative. Fact Sheet for Patients:  PinkCheek.be Fact Sheet for Healthcare Providers:  GravelBags.it This test is not yet ap proved or cleared by the [place] FDA and  has been authorized for detection and/or diagnosis of SARS-CoV-2 by FDA under an  Emergency Use Authorization (EUA). This EUA will remain  in effect (meaning this test can be used) for the duration of the COVID-19 declaration under Section 564(b)(1) of the Act, 21 U.S.C. section 360bbb-3(b)(1), unless the authorization is terminated or revoked sooner.    Influenza A by PCR NEGATIVE NEGATIVE Final   Influenza B by PCR NEGATIVE NEGATIVE Final    Comment: (NOTE) The Xpert Xpress SARS-CoV-2/FLU/RSV assay is intended as an aid in  the diagnosis of influenza from Nasopharyngeal swab specimens and  should not be used as a sole basis for treatment. Nasal washings and  aspirates are unacceptable for Xpert Xpress SARS-CoV-2/FLU/RSV  testing. Fact Sheet for Patients: PinkCheek.be Fact Sheet for Healthcare Providers: GravelBags.it This test is not yet approved or cleared by the [place] FDA and  has been authorized for detection and/or diagnosis of SARS-CoV-2 by  FDA under an Emergency Use Authorization (EUA). This EUA will remain  in effect (meaning this test can be used) for the duration of the  Covid-19 declaration under Section 564(b)(1) of the Act, 21  U.S.C. section 360bbb-3(b)(1), unless the authorization is  terminated or revoked. Performed at [facility], [address] [place], [place] [address]     Coagulation Studies: No results for input(s): LABPROT, INR in the last 72 hours.  Urinalysis: Recent Labs    01/23/20 0922  COLORURINE STRAW*  LABSPEC 1.011  PHURINE 7.0  GLUCOSEU >=500*  HGBUR SMALL*  BILIRUBINUR NEGATIVE  KETONESUR NEGATIVE  PROTEINUR >=300*  NITRITE NEGATIVE  LEUKOCYTESUR NEGATIVE      Imaging: No results found.   Medications:   . sodium chloride 125 mL/hr at 01/25/20 1411   . amLODipine  10 mg Oral Daily  . aspirin EC  81 mg Oral QODAY  . atorvastatin  80 mg Oral QPM  . [START ON 01/26/2020] carvedilol  25 mg Oral Daily  . docusate sodium  100 mg  Oral BID  . heparin  5,000 Units Subcutaneous Q8H  . hydrALAZINE  50 mg Oral Q8H  . insulin aspart  0-5 Units Subcutaneous QHS  . insulin aspart  0-9 Units Subcutaneous TID WC  . insulin aspart  6 Units Subcutaneous TID WC  . insulin detemir  20 Units Subcutaneous Daily  . polyethylene glycol  17 g Oral Daily  . promethazine  6.25 mg Intravenous Once   acetaminophen, bisacodyl, dextrose, hydrALAZINE, metoCLOPramide (REGLAN) injection, ondansetron (ZOFRAN) IV, pantoprazole  Assessment/ Plan:  49 y.o. male with past medical history of chronic kidney disease stage IV, hypertension, hyperlipidemia, history of TIA, diabetes mellitus type 1, anemia of chronic kidney disease, secondary hyperparathyroidism who presents now hyperosmolar and hyperglycemic state.  1.  Acute kidney injury/chronic kidney disease stage IV baseline EGFR 24/diabetes mellitus type 2  with chronic kidney disease.  Suspect renal function worse now secondary to recurrent hypoglycemia today.  Initially it appeared that the patient's renal function was improving but now worse as creatinine up to 4.21 with a EGFR of 16.  We will maintain the patient on IV fluid hydration.  Recommend stricter glucose control.  Deferred this issue regarding blood sugars to hospitalist.  2.  Anemia of chronic kidney disease.  Hemoglobin 11.2 today.  Continue to monitor.  3.  Hypertension.  Continue amlodipine, hydralazine, and carvedilol.   LOS: 1 [name] 4/23/20212:13 PM

## 2020-01-26 DIAGNOSIS — I10 Essential (primary) hypertension: Secondary | ICD-10-CM

## 2020-01-26 DIAGNOSIS — N179 Acute kidney failure, unspecified: Secondary | ICD-10-CM

## 2020-01-26 DIAGNOSIS — E871 Hypo-osmolality and hyponatremia: Secondary | ICD-10-CM

## 2020-01-26 DIAGNOSIS — E669 Obesity, unspecified: Secondary | ICD-10-CM

## 2020-01-26 DIAGNOSIS — E7800 Pure hypercholesterolemia, unspecified: Secondary | ICD-10-CM

## 2020-01-26 DIAGNOSIS — N189 Chronic kidney disease, unspecified: Secondary | ICD-10-CM

## 2020-01-26 DIAGNOSIS — D631 Anemia in chronic kidney disease: Secondary | ICD-10-CM

## 2020-01-26 DIAGNOSIS — E1065 Type 1 diabetes mellitus with hyperglycemia: Principal | ICD-10-CM

## 2020-01-26 LAB — GLUCOSE, CAPILLARY
Glucose-Capillary: 197 mg/dL — ABNORMAL HIGH (ref 70–99)
Glucose-Capillary: 366 mg/dL — ABNORMAL HIGH (ref 70–99)
Glucose-Capillary: 301 mg/dL — ABNORMAL HIGH (ref 70–99)
Glucose-Capillary: 239 mg/dL — ABNORMAL HIGH (ref 70–99)
Glucose-Capillary: 209 mg/dL — ABNORMAL HIGH (ref 70–99)

## 2020-01-26 LAB — CBC
HCT: 34 % — ABNORMAL LOW (ref 39.0–52.0)
Hemoglobin: 11.3 g/dL — ABNORMAL LOW (ref 13.0–17.0)
MCH: 29 pg (ref 26.0–34.0)
MCHC: 33.2 g/dL (ref 30.0–36.0)
MCV: 87.4 fL (ref 80.0–100.0)
Platelets: 256 10*3/uL (ref 150–400)
RBC: 3.89 MIL/uL — ABNORMAL LOW (ref 4.22–5.81)
RDW: 13.8 % (ref 11.5–15.5)
WBC: 7 10*3/uL (ref 4.0–10.5)
nRBC: 0 % (ref 0.0–0.2)

## 2020-01-26 LAB — BASIC METABOLIC PANEL
Anion gap: 8 (ref 5–15)
BUN: 41 mg/dL — ABNORMAL HIGH (ref 6–20)
CO2: 16 mmol/L — ABNORMAL LOW (ref 22–32)
Calcium: 8.1 mg/dL — ABNORMAL LOW (ref 8.9–10.3)
Chloride: 106 mmol/L (ref 98–111)
Creatinine, Ser: 4.21 mg/dL — ABNORMAL HIGH (ref 0.61–1.24)
GFR calc Af Amer: 18 mL/min — ABNORMAL LOW (ref >60)
GFR calc non Af Amer: 16 mL/min — ABNORMAL LOW (ref >60)
Glucose, Bld: 239 mg/dL — ABNORMAL HIGH (ref 70–99)
Potassium: 5 mmol/L (ref 3.5–5.1)
Sodium: 130 mmol/L — ABNORMAL LOW (ref 135–145)

## 2020-01-26 MED ORDER — INSULIN ASPART 100 UNIT/ML ~~LOC~~ SOLN
0.0000 [IU] | Freq: Every day | SUBCUTANEOUS | Status: DC
  Administered 2020-01-26: 21:00:00 2 [IU] via SUBCUTANEOUS
  Filled 2020-01-26: qty 1

## 2020-01-26 MED ORDER — BUTALBITAL-APAP-CAFFEINE 50-325-40 MG PO TABS
1.0000 | ORAL_TABLET | Freq: Four times a day (QID) | ORAL | Status: DC | PRN
  Administered 2020-01-26 – 2020-01-27 (×4): 1 via ORAL
  Filled 2020-01-26 (×5): qty 1

## 2020-01-26 MED ORDER — INSULIN ASPART 100 UNIT/ML ~~LOC~~ SOLN
0.0000 [IU] | Freq: Three times a day (TID) | SUBCUTANEOUS | Status: DC
  Administered 2020-01-26: 5 [IU] via SUBCUTANEOUS
  Administered 2020-01-26: 11 [IU] via SUBCUTANEOUS
  Administered 2020-01-27 (×3): 3 [IU] via SUBCUTANEOUS
  Filled 2020-01-26 (×6): qty 1

## 2020-01-26 MED ORDER — SODIUM BICARBONATE 650 MG PO TABS
650.0000 mg | ORAL_TABLET | Freq: Three times a day (TID) | ORAL | Status: DC
  Administered 2020-01-26 – 2020-01-28 (×5): 650 mg via ORAL
  Filled 2020-01-26 (×8): qty 1

## 2020-01-26 MED ORDER — INSULIN DETEMIR 100 UNIT/ML ~~LOC~~ SOLN
15.0000 [IU] | Freq: Two times a day (BID) | SUBCUTANEOUS | Status: DC
  Administered 2020-01-26: 21:00:00 15 [IU] via SUBCUTANEOUS
  Filled 2020-01-26 (×4): qty 0.15

## 2020-01-26 NOTE — Progress Notes (Signed)
[name], Matthew   PCP: [name], Matthew  Patient is from: Home.  DOA: 01/23/2020 LOS: 2  Brief Narrative / Interim history: 49 year old male with history of [name], [name], [name], [name], [name] labs" and admitted for accelerated [name], hyperglycemia, hyponatremia and AKI on [name].  Started on IV fluids and insulin drip.  Eventually transition to subcu insulin.  Insulin [name] malfunctioning.  Subjective: Seen and examined earlier this morning.  No major events overnight or this morning.  Feels dehydrated and nauseated.  Feels his mouth is dry despite IV fluid.  Denies emesis.  Denies chest pain, dyspnea, abdominal pain, diarrhea or UTI symptoms.  He is somewhat frustrated about his blood glucose and his symptoms.  Objective: Vitals:   01/25/20 2124 01/26/20 0025 01/26/20 0501 01/26/20 0753  BP: 131/60 114/60 134/66 (!) 138/57  Pulse: 85 88 94 98  Resp: 16 16 16 18   Temp: 98.7 F (37.1 C) 98.2 F (36.8 C) 98.5 F (36.9 C) 98.2 F (36.8 C)  TempSrc: Oral Oral Oral Oral  SpO2: 96% 95% 96% 96%  Weight:      Height:        Intake/Output Summary (Last 24 hours) at 01/26/2020 1151 Last data filed at 01/26/2020 0738 Gross per 24 hour  Intake 4948.27 ml  Output --  Net 4948.27 ml   Filed Weights   01/23/20 0855 01/23/20 0904 01/23/20 2018  Weight: 113.4 kg 113.4 kg 111 kg    Examination:  GENERAL: No apparent distress. Nontoxic.  HEENT: MMM.  Vision and hearing grossly intact.  NECK: Supple.  No apparent JVD.  RESP:  No IWOB. Good air movement bilaterally. CVS:  RRR. Heart sounds normal.  ABD/GI/GU: BS present. Soft. Non tender.  MSK/EXT:  Moves extremities. No apparent deformity. No edema.  SKIN: no apparent skin lesion or wound NEURO: Awake, alert and oriented appropriately.  No apparent focal neuro deficit. PSYCH: Calm. Normal affect.  Procedures:   None  Microbiology summarized: 4/21-COVID-19 PCR negative. 4/21-influenza a and B PCR negative.   Assessment & Plan: Uncontrolled hypertension with hyperglycemia: A1c [name].5%.  CBG remains elevated. Recent Labs    01/25/20 2148 01/26/20 0315 01/26/20 0754  GLUCAP 195* 197* 366*  -Increase SSI to moderate with nightly coverage -Continue NovoLog AC. -Change Levemir from 20 units daily to 15 units twice daily -Continue statin. -Increase IV NS from 125 to 150 cc over the next 24 hours. -Diabetic teaching   AKI on [name]/azotemia: Baseline Cr 2.6-3.0?> 4.11 (admit)> 4.21.  Has elevated values previously.  On p.o. Lasix 40 mg daily at home.  No other nephrotoxic meds on his med list. -Adjust IV fluid as above -Avoid nephrotoxic meds.  Anemia of renal disease: H&H stable. -Check anemia panel  Hyponatremia: baseline Na 130-135> 126 (admit)>> 130 (corrects to normal for hyperglycemia). -IV fluid as above  Hyperkalemia: Hold.  Essential hypertension: Normotensive -Continue current regimen  Class II obesity: Body mass index is 33.19 kg/m. -Encourage lifestyle change to lose weight.                 DVT prophylaxis: Subcu heparin Code Status: Full code Family Communication: Patient and/or RN. Available if any question.   Discharge barrier: Uncontrolled diabetes with significant hyperglycemia and dehydration Patient is from: Home Final disposition: Likely home in the next 24 to 48 hours  Consultants:  None   Sch Meds:  Scheduled Meds: .  amLODipine  [name] mg Oral Daily  . aspirin EC  81 mg Oral QODAY  . atorvastatin  80 mg Oral QPM  . carvedilol  25 mg Oral Daily  . docusate sodium  100 mg Oral BID  . heparin  5,000 Units Subcutaneous Q8H  . hydrALAZINE  50 mg Oral Q8H  . insulin aspart  0-15 Units Subcutaneous TID WC  . insulin aspart  0-5 Units Subcutaneous QHS  . insulin aspart  6 Units Subcutaneous TID WC  . insulin detemir  15 Units Subcutaneous BID  .  polyethylene glycol  17 g Oral Daily  . promethazine  6.25 mg Intravenous Once   Continuous Infusions: . sodium chloride 150 mL/hr at 01/26/20 0923   PRN Meds:.acetaminophen, bisacodyl, butalbital-acetaminophen-caffeine, dextrose, hydrALAZINE, metoCLOPramide (REGLAN) injection, ondansetron (ZOFRAN) IV, pantoprazole  Antimicrobials: Anti-infectives (From admission, onward)   None       I have personally reviewed the following labs and images: CBC: Recent Labs  Lab 01/23/20 0921 01/24/20 0509 01/25/20 0340 01/26/20 0729  WBC 6.9 5.3 6.4 7.0  NEUTROABS 4.0  --   --   --   HGB 11.9* 11.7* 11.2* 11.3*  HCT 36.0* 34.0* 34.7* 34.0*  MCV 87.0 84.2 89.0 87.4  PLT 285 251 254 256   BMP &GFR Recent Labs  Lab 01/23/20 1024 01/23/20 1459 01/24/20 0509 01/25/20 0340 01/26/20 0415  NA 130* 136 135 131* 130*  K 5.8* 4.5 4.3 4.5 5.0  CL 101 107 107 105 106  CO2 23 21* 22 20* 16*  GLUCOSE 493* 141* 196* 377* 239*  BUN 49* 43* 42* 43* 41*  CREATININE 4.20* 3.99* 3.79* 4.21* 4.21*  CALCIUM 8.3* 8.5* 8.5* 7.9* 8.1*  MG  --   --   --  2.2  --    Estimated Creatinine Clearance: 27.9 mL/min (A) (by C-G formula based on SCr of 4.21 mg/dL (H)). Liver & Pancreas: Recent Labs  Lab 01/23/20 0921  AST 24  ALT 21  ALKPHOS 85  BILITOT 0.8  PROT 5.9*  ALBUMIN 2.5*   No results for input(s): LIPASE, AMYLASE in the last 168 hours. No results for input(s): AMMONIA in the last 168 hours. Diabetic: Recent Labs    01/23/20 1459  HGBA1C [name].5*   Recent Labs  Lab 01/25/20 1145 01/25/20 1647 01/25/20 2148 01/26/20 0315 01/26/20 0754  GLUCAP 395* 250* 195* 197* 366*   Cardiac Enzymes: No results for input(s): CKTOTAL, CKMB, CKMBINDEX, TROPONINI in the last 168 hours. No results for input(s): PROBNP in the last 8760 hours. Coagulation Profile: No results for input(s): INR, PROTIME in the last 168 hours. Thyroid Function Tests: No results for input(s): TSH, T4TOTAL, FREET4,  T3FREE, THYROIDAB in the last 72 hours. Lipid Profile: No results for input(s): CHOL, HDL, LDLCALC, TRIG, CHOLHDL, LDLDIRECT in the last 72 hours. Anemia Panel: No results for input(s): VITAMINB12, FOLATE, FERRITIN, TIBC, IRON, RETICCTPCT in the last 72 hours. Urine analysis:    Component Value Date/Time   COLORURINE STRAW (A) 01/23/2020 0922   APPEARANCEUR CLEAR (A) 01/23/2020 0922   LABSPEC 1.011 01/23/2020 0922   PHURINE 7.0 01/23/2020 0922   GLUCOSEU >=500 (A) 01/23/2020 0922   HGBUR SMALL (A) 01/23/2020 0922   BILIRUBINUR NEGATIVE 01/23/2020 0922   KETONESUR NEGATIVE 01/23/2020 0922   PROTEINUR >=300 (A) 01/23/2020 0922   UROBILINOGEN 0.2 06/07/2014 0151   NITRITE NEGATIVE 01/23/2020 0922   LEUKOCYTESUR NEGATIVE 01/23/2020 0922   Sepsis Labs: Invalid input(s): PROCALCITONIN, LACTICIDVEN  Microbiology: Recent Results (from the  past 240 hour(s))  Respiratory Panel by RT PCR (Flu A&B, Covid) - Nasopharyngeal Swab     Status: None   Collection Time: 01/23/20 12:18 PM   Specimen: Nasopharyngeal Swab  Result Value Ref Range Status   SARS Coronavirus 2 by RT PCR NEGATIVE NEGATIVE Final    Comment: (NOTE) SARS-CoV-2 target nucleic acids are NOT DETECTED. The SARS-CoV-2 RNA is generally detectable in upper respiratoy specimens during the acute phase of infection. The lowest concentration of SARS-CoV-2 viral copies this assay can detect is 131 copies/mL. A negative result does not preclude SARS-Cov-2 infection and should not be used as the sole basis for treatment or other patient management decisions. A negative result may occur with  improper specimen collection/handling, submission of specimen other than nasopharyngeal swab, presence of viral mutation(s) within the areas targeted by this assay, and inadequate number of viral copies (<131 copies/mL). A negative result must be combined with clinical observations, patient history, and epidemiological information. The expected  result is Negative. Fact Sheet for Patients:  PinkCheek.be Fact Sheet for Healthcare Providers:  GravelBags.it This test is not yet ap proved or cleared by the [place] FDA and  has been authorized for detection and/or diagnosis of SARS-CoV-2 by FDA under an Emergency Use Authorization (EUA). This EUA will remain  in effect (meaning this test can be used) for the duration of the COVID-19 declaration under Section 564(b)(1) of the Act, 21 U.S.C. section 360bbb-3(b)(1), unless the authorization is terminated or revoked sooner.    Influenza A by PCR NEGATIVE NEGATIVE Final   Influenza B by PCR NEGATIVE NEGATIVE Final    Comment: (NOTE) The Xpert Xpress SARS-CoV-2/FLU/RSV assay is intended as an aid in  the diagnosis of influenza from Nasopharyngeal swab specimens and  should not be used as a sole basis for treatment. Nasal washings and  aspirates are unacceptable for Xpert Xpress SARS-CoV-2/FLU/RSV  testing. Fact Sheet for Patients: PinkCheek.be Fact Sheet for Healthcare Providers: GravelBags.it This test is not yet approved or cleared by the [place] FDA and  has been authorized for detection and/or diagnosis of SARS-CoV-2 by  FDA under an Emergency Use Authorization (EUA). This EUA will remain  in effect (meaning this test can be used) for the duration of the  Covid-19 declaration under Section 564(b)(1) of the Act, 21  U.S.C. section 360bbb-3(b)(1), unless the authorization is  terminated or revoked. Performed at [facility], [address], [place], [place] [address]     Radiology Studies: No results found.     Allyse Fregeau T. Thatcher  If 7PM-7AM, please contact night-coverage [email or web address] Password [facility] 01/26/2020, 11:51 AM

## 2020-01-26 NOTE — Progress Notes (Signed)
[name]  MRN: [id number]  DOB/AGE: 1971/07/24 49 y.o.  Primary Care Physician:Johnston, Chrystie Nose, MD  Admit date: 01/23/2020  Chief Complaint:  Chief Complaint  Patient presents with  . Hyperglycemia    S-Pt presented on  01/23/2020 with  Chief Complaint  Patient presents with  . Hyperglycemia  . Pt main concern was " My sugar is so high, Can I use my insulin pump?"  Meds . amLODipine  10 mg Oral Daily  . aspirin EC  81 mg Oral QODAY  . atorvastatin  80 mg Oral QPM  . carvedilol  25 mg Oral Daily  . docusate sodium  100 mg Oral BID  . heparin  5,000 Units Subcutaneous Q8H  . hydrALAZINE  50 mg Oral Q8H  . insulin aspart  0-5 Units Subcutaneous QHS  . insulin aspart  0-9 Units Subcutaneous TID WC  . insulin aspart  6 Units Subcutaneous TID WC  . insulin detemir  20 Units Subcutaneous Daily  . polyethylene glycol  17 g Oral Daily  . promethazine  6.25 mg Intravenous Once         WGY:[id number] from the symptoms mentioned above,there are no other symptoms referable to all systems reviewed.  Physical Exam: Vital signs in last 24 hours: Temp:  [97.8 F (36.6 C)-98.7 F (37.1 C)] 98.2 F (36.8 C) (04/24 0753) Pulse Rate:  [81-98] 98 (04/24 0753) Resp:  [16-18] 18 (04/24 0753) BP: (114-154)/(57-78) 138/57 (04/24 0753) SpO2:  [95 %-99 %] 96 % (04/24 0753) Weight change:  Last BM Date: 01/24/20  Intake/Output from previous day: 04/23 0701 - 04/24 0700 In: 480 [P.O.:480] Out: -  Total I/O In: 4468.3 [I.V.:4468.3] Out: -    Physical Exam: General- pt is awake,alert, oriented to time place and person Resp- No acute REsp distress, CTA B/L NO Rhonchi CVS- S1S2 regular in rate and rhythm GIT- BS+, soft, NT, ND EXT- 1+ LE Edema, No Cyanosis   Lab Results: CBC Recent Labs    01/24/20 0509 01/25/20 0340  WBC 5.3 6.4  HGB 11.7* 11.2*  HCT 34.0* 34.7*  PLT 251 254    BMET Recent Labs    01/25/20 0340 01/26/20 0415  NA 131* 130*  K 4.5 5.0   CL 105 106  CO2 20* 16*  GLUCOSE 377* 239*  BUN 43* 41*  CREATININE 4.21* 4.21*  CALCIUM 7.9* 8.1*    Creatinine trend 2000 2021 3.8--4.2 2020 2.7--4.5-acute kidney injury during December admission 2019 1.9--2.0 2016 1.2--1.3     MICRO Recent Results (from the past 240 hour(s))  Respiratory Panel by RT PCR (Flu A&B, Covid) - Nasopharyngeal Swab     Status: None   Collection Time: 01/23/20 12:18 PM   Specimen: Nasopharyngeal Swab  Result Value Ref Range Status   SARS Coronavirus 2 by RT PCR NEGATIVE NEGATIVE Final    Comment: (NOTE) SARS-CoV-2 target nucleic acids are NOT DETECTED. The SARS-CoV-2 RNA is generally detectable in upper respiratoy specimens during the acute phase of infection. The lowest concentration of SARS-CoV-2 viral copies this assay can detect is 131 copies/mL. A negative result does not preclude SARS-Cov-2 infection and should not be used as the sole basis for treatment or other patient management decisions. A negative result may occur with  improper specimen collection/handling, submission of specimen other than nasopharyngeal swab, presence of viral mutation(s) within the areas targeted by this assay, and inadequate number of viral copies (<131 copies/mL). A negative result must be combined with clinical observations, patient history, and  epidemiological information. The expected result is Negative. Fact Sheet for Patients:  PinkCheek.be Fact Sheet for Healthcare Providers:  GravelBags.it This test is not yet ap proved or cleared by the [place] FDA and  has been authorized for detection and/or diagnosis of SARS-CoV-2 by FDA under an Emergency Use Authorization (EUA). This EUA will remain  in effect (meaning this test can be used) for the duration of the COVID-19 declaration under Section 564(b)(1) of the Act, 21 U.S.C. section 360bbb-3(b)(1), unless the authorization is terminated  or revoked sooner.    Influenza A by PCR NEGATIVE NEGATIVE Final   Influenza B by PCR NEGATIVE NEGATIVE Final    Comment: (NOTE) The Xpert Xpress SARS-CoV-2/FLU/RSV assay is intended as an aid in  the diagnosis of influenza from Nasopharyngeal swab specimens and  should not be used as a sole basis for treatment. Nasal washings and  aspirates are unacceptable for Xpert Xpress SARS-CoV-2/FLU/RSV  testing. Fact Sheet for Patients: PinkCheek.be Fact Sheet for Healthcare Providers: GravelBags.it This test is not yet approved or cleared by the [place] FDA and  has been authorized for detection and/or diagnosis of SARS-CoV-2 by  FDA under an Emergency Use Authorization (EUA). This EUA will remain  in effect (meaning this test can be used) for the duration of the  Covid-19 declaration under Section 564(b)(1) of the Act, 21  U.S.C. section 360bbb-3(b)(1), unless the authorization is  terminated or revoked. Performed at [facility], [address], [place], [place] [address]       Lab Results  Component Value Date   CALCIUM 8.1 (L) 01/26/2020   CAION 1.02 (L) 09/27/2019               Impression:    Patient is a 49 year old male with a past medical history of CKD stage IV, hypertension, hyperlipidemia, TIA, diabetes mellitus type 1, anemia of chronic disease, secondary hyperparathyroidism who came to the ER with chief complaint of type 1 diabetes mellitus with hyperosmolar-hyperglycemic state  1)Renal  AKI secondary to ATN Patient has AKI secondary to uncontrolled hyperglycemia Patient has AKI on CKD Patient has CKD stage IV Patient has CKD since 2011-patient UA showed proteinuria since then Patient has CKD secondary to diabetes mellitus Patient CKD progression has been marked with episodes of acute kidney injury   2)HTN Blood pressure is stable   3)Anemia of chronic disease  HGb at goal  (9--11)   4) hypocalcemia Patient calcium is 8.1-patient albumin is 2.5 Patient corrected calcium comes out to be 9.3   5)-diabetes mellitus with hyperosmolar hyperglycemic state Primary team is following    6) electrolytes   sodium Hyponatremia secondary to hyperglycemia   potassium Hyperkalemia  now better    7)Acid base Co2 is not at goal Will start PO bicarb    Plan:  Will start po bicarb. Will follow BMET   I had extensive discussion with pt about his kidney related issues. Pt asked me relevant questions about access/ different modalities of dialysis/ transplant/post transplant care. I answered pt queries to best of my ability.      Cyndy Braver s Theador Hawthorne 01/26/2020, 8:08 AM

## 2020-01-26 NOTE — Plan of Care (Signed)
  Problem: Health Behavior/Discharge Planning: Goal: Ability to manage health-related needs will improve Outcome: Progressing   Problem: Clinical Measurements: Goal: Ability to maintain clinical measurements within normal limits will improve Outcome: Progressing Goal: Will remain free from infection Outcome: Progressing Goal: Diagnostic test results will improve Outcome: Progressing   

## 2020-01-27 ENCOUNTER — Inpatient Hospital Stay: Payer: 59

## 2020-01-27 DIAGNOSIS — G4733 Obstructive sleep apnea (adult) (pediatric): Secondary | ICD-10-CM

## 2020-01-27 LAB — CBC
HCT: 33 % — ABNORMAL LOW (ref 39.0–52.0)
Hemoglobin: 11 g/dL — ABNORMAL LOW (ref 13.0–17.0)
MCH: 28.9 pg (ref 26.0–34.0)
MCHC: 33.3 g/dL (ref 30.0–36.0)
MCV: 86.6 fL (ref 80.0–100.0)
Platelets: 251 10*3/uL (ref 150–400)
RBC: 3.81 MIL/uL — ABNORMAL LOW (ref 4.22–5.81)
RDW: 13.6 % (ref 11.5–15.5)
WBC: 6.9 10*3/uL (ref 4.0–10.5)
nRBC: 0 % (ref 0.0–0.2)

## 2020-01-27 LAB — RENAL FUNCTION PANEL
Albumin: 2.1 g/dL — ABNORMAL LOW (ref 3.5–5.0)
Anion gap: 5 (ref 5–15)
BUN: 46 mg/dL — ABNORMAL HIGH (ref 6–20)
CO2: 20 mmol/L — ABNORMAL LOW (ref 22–32)
Calcium: 8.2 mg/dL — ABNORMAL LOW (ref 8.9–10.3)
Chloride: 106 mmol/L (ref 98–111)
Creatinine, Ser: 4.4 mg/dL — ABNORMAL HIGH (ref 0.61–1.24)
GFR calc Af Amer: 17 mL/min — ABNORMAL LOW (ref >60)
GFR calc non Af Amer: 15 mL/min — ABNORMAL LOW (ref >60)
Glucose, Bld: 207 mg/dL — ABNORMAL HIGH (ref 70–99)
Phosphorus: 3.7 mg/dL (ref 2.5–4.6)
Potassium: 4.6 mmol/L (ref 3.5–5.1)
Sodium: 131 mmol/L — ABNORMAL LOW (ref 135–145)

## 2020-01-27 LAB — GLUCOSE, CAPILLARY
Glucose-Capillary: 243 mg/dL — ABNORMAL HIGH (ref 70–99)
Glucose-Capillary: 200 mg/dL — ABNORMAL HIGH (ref 70–99)
Glucose-Capillary: 179 mg/dL — ABNORMAL HIGH (ref 70–99)
Glucose-Capillary: 178 mg/dL — ABNORMAL HIGH (ref 70–99)

## 2020-01-27 LAB — MAGNESIUM: Magnesium: 2.1 mg/dL (ref 1.7–2.4)

## 2020-01-27 MED ORDER — INSULIN ASPART 100 UNIT/ML ~~LOC~~ SOLN
8.0000 [IU] | Freq: Three times a day (TID) | SUBCUTANEOUS | Status: DC
  Administered 2020-01-27 (×2): 8 [IU] via SUBCUTANEOUS
  Filled 2020-01-27 (×2): qty 1

## 2020-01-27 MED ORDER — HYDRALAZINE HCL 50 MG PO TABS
75.0000 mg | ORAL_TABLET | Freq: Three times a day (TID) | ORAL | Status: DC
  Administered 2020-01-27 – 2020-01-28 (×3): 75 mg via ORAL
  Filled 2020-01-27 (×3): qty 1

## 2020-01-27 MED ORDER — INSULIN DETEMIR 100 UNIT/ML ~~LOC~~ SOLN
18.0000 [IU] | Freq: Two times a day (BID) | SUBCUTANEOUS | Status: DC
  Filled 2020-01-27: qty 0.18

## 2020-01-27 MED ORDER — INSULIN DETEMIR 100 UNIT/ML ~~LOC~~ SOLN
18.0000 [IU] | Freq: Two times a day (BID) | SUBCUTANEOUS | Status: DC
  Administered 2020-01-27 (×2): 18 [IU] via SUBCUTANEOUS
  Filled 2020-01-27 (×3): qty 0.18

## 2020-01-27 NOTE — Progress Notes (Signed)
Inpatient Diabetes Program Recommendations  AACE/ADA: New Consensus Statement on Inpatient Glycemic Control (2015)  Target Ranges:  Prepandial:   less than 140 mg/dL      Peak postprandial:   less than 180 mg/dL (1-2 hours)      Critically ill patients:  140 - 180 mg/dL   Results for CALVIN, JABLONOWSKI (MRN [id number]) as of 01/27/2020 09:32  Ref. Range 01/26/2020 07:54 01/26/2020 12:05 01/26/2020 16:36 01/26/2020 21:00  Glucose-Capillary Latest Ref Range: 70 - 99 mg/dL 366 (H)  15 units NOVOLOG +  20 units LEVEMIR  301 (H)  17 units NOVOLOG  239 (H)  11 units NOVOLOG  209 (H)  2 units NOVOLOG +  15 units LEVEMIR    Results for SUN, WILENSKY (MRN [id number]) as of 01/27/2020 09:32  Ref. Range 01/27/2020 08:20  Glucose-Capillary Latest Ref Range: 70 - 99 mg/dL 200 (H)  9 units NOVOLOG     Home DM Meds: Insulin Pump  Current Orders: Levemir 15 units BID      Novolog Moderate Correction Scale/ SSI (0-15 units) TID AC + HS      Novolog 6 units TID with meals    MD- Please consider the following in-hospital insulin adjustments:  1. Increase Levemir slightly to 17 units BID  2. Increase Novolog Meal Coverage to: Novolog 8 units TID with meals    --Will follow patient during hospitalization--  Wyn Quaker RN, MSN, CDE Diabetes Coordinator Inpatient Glycemic Control Team Team Pager: [phone] (8a-5p)

## 2020-01-27 NOTE — Progress Notes (Signed)
PROGRESS NOTE  [name]:[id number] DOB: 19-May-1971   PCP: [name], MD  Patient is from: Home.  DOA: 01/23/2020 LOS: 3  Brief Narrative / Interim history: 49 year old male with history of HTN, HLD, DM-1 on insulin pump, CKD-4, PVC and TIA presented with generalized weakness and "abnormal labs" and admitted for accelerated HTN, hyperglycemia, hyponatremia and AKI on CKD-4.  Started on IV fluids and insulin drip.  Eventually transitioned to subcu insulin.  Insulin pump malfunctioning.  Subjective: Seen and examined earlier this morning.  No major events overnight of this morning.  Complaining of 8/10 headache which is mainly over the frontal aspect.  Denies changes to his vision or other focal neuro symptoms. He hasn't been using his CPAP here. Likes to bring his own CPAP from home. Denies chest pain, dyspnea or GI symptoms. Dry mouth and nausea improved.    Objective: Vitals:   01/26/20 2015 01/26/20 2257 01/27/20 0421 01/27/20 0819  BP: (!) 142/63 (!) 143/63 (!) 160/98 (!) 159/78  Pulse: 90 93 87 89  Resp: 18 18 18 17   Temp: 99.6 F (37.6 C)  98.7 F (37.1 C) 98.2 F (36.8 C)  TempSrc: Oral  Oral Oral  SpO2: 96% 94% 97% 95%  Weight:      Height:        Intake/Output Summary (Last 24 hours) at 01/27/2020 1018 Last data filed at 01/27/2020 1006 Gross per 24 hour  Intake 1158.17 ml  Output --  Net 1158.17 ml   Filed Weights   01/23/20 0855 01/23/20 0904 01/23/20 2018  Weight: 113.4 kg 113.4 kg 111 kg    Examination:  GENERAL: No apparent distress.  Nontoxic. HEENT: MMM.  Vision and hearing grossly intact.  NECK: Supple.  No apparent JVD.  RESP:  No IWOB.  Fair aeration bilaterally. CVS:  RRR. Heart sounds normal.  ABD/GI/GU: Bowel sounds present. Soft. Non tender.  MSK/EXT:  Moves extremities. No apparent deformity. Trace pedal edema.  SKIN: no apparent skin lesion or wound NEURO: Awake, alert and oriented appropriately.  No apparent focal  neuro deficit. PSYCH: Calm. Normal affect.  Procedures:  None  Microbiology summarized: 4/21-COVID-19 PCR negative. 4/21-influenza A and B PCR negative.   Assessment & Plan: Uncontrolled hypertension with hyperglycemia: A1c 10.5%.  CBG remains elevated. Recent Labs    01/26/20 2100 01/27/20 0237 01/27/20 0820  GLUCAP 209* 243* 200*  -Continue SSI to moderate with nightly coverage -Increase NovoLog from 6 to 8u [facility]. -Increase Levemir from 15 to 18 units twice a day  -Continue statin. -Diabetic teaching -He may follow-up with his neurologist for his insulin pump issue right after discharge   AKI on CKD-4/azotemia: Baseline Cr 2.6-3.0?> 4.11 (admit)> 4.21>4.4.  Has elevated values previously.  On p.o. Lasix 40 mg daily at home.  No other nephrotoxic meds on his med list. IV fluid stopped yesterday due edema -Nephrology following -Avoid nephrotoxic meds.  Metabolic acidosis: likely due to renal failure -Continue monitoring.  Anemia of renal disease: H&H stable. -Check anemia panel  Hyponatremia: baseline Na 130-135> 126 (admit)>> 131 (corrects to normal for hyperglycemia). -IV fluid as above  Hyperkalemia: Hold.  Essential hypertension: Normotensive -Continue current regimen  OSA: On CPAP at home. -Start nightly CPAP  Tension headache:  -Fioricet/Tylenol as needed  -CPAP as above  Class II obesity: Body mass index is 33.19 kg/m. -Encourage lifestyle change to lose weight.                 DVT prophylaxis: Subcu  heparin Code Status: Full code Family Communication: Patient and/or RN. Available if any question.   Discharge barrier: AKI with worsening renal function and hyperglycemia requiring close titration of his insulin Patient is from: Home Final disposition: Likely home in the next 24 to 48 hours once cleared by nephrology.   Consultants:  Nephrology   Sch Meds:  Scheduled Meds: . amLODipine  10 mg Oral Daily  . aspirin EC  81 mg Oral QODAY   . atorvastatin  80 mg Oral QPM  . carvedilol  25 mg Oral Daily  . docusate sodium  100 mg Oral BID  . heparin  5,000 Units Subcutaneous Q8H  . hydrALAZINE  75 mg Oral Q8H  . insulin aspart  0-15 Units Subcutaneous TID WC  . insulin aspart  0-5 Units Subcutaneous QHS  . insulin aspart  6 Units Subcutaneous TID WC  . insulin detemir  15 Units Subcutaneous BID  . polyethylene glycol  17 g Oral Daily  . promethazine  6.25 mg Intravenous Once  . sodium bicarbonate  650 mg Oral TID   Continuous Infusions:  PRN Meds:.acetaminophen, bisacodyl, butalbital-acetaminophen-caffeine, dextrose, hydrALAZINE, metoCLOPramide (REGLAN) injection, ondansetron (ZOFRAN) IV, pantoprazole  Antimicrobials: Anti-infectives (From admission, onward)   None       I have personally reviewed the following labs and images: CBC: Recent Labs  Lab 01/23/20 0921 01/24/20 0509 01/25/20 0340 01/26/20 0729 01/27/20 0420  WBC 6.9 5.3 6.4 7.0 6.9  NEUTROABS 4.0  --   --   --   --   HGB 11.9* 11.7* 11.2* 11.3* 11.0*  HCT 36.0* 34.0* 34.7* 34.0* 33.0*  MCV 87.0 84.2 89.0 87.4 86.6  PLT 285 251 254 256 251   BMP &GFR Recent Labs  Lab 01/23/20 1459 01/24/20 0509 01/25/20 0340 01/26/20 0415 01/27/20 0420  NA 136 135 131* 130* 131*  K 4.5 4.3 4.5 5.0 4.6  CL 107 107 105 106 106  CO2 21* 22 20* 16* 20*  GLUCOSE 141* 196* 377* 239* 207*  BUN 43* 42* 43* 41* 46*  CREATININE 3.99* 3.79* 4.21* 4.21* 4.40*  CALCIUM 8.5* 8.5* 7.9* 8.1* 8.2*  MG  --   --  2.2  --  2.1  PHOS  --   --   --   --  3.7   Estimated Creatinine Clearance: 26.7 mL/min (A) (by C-G formula based on SCr of 4.4 mg/dL (H)). Liver & Pancreas: Recent Labs  Lab 01/23/20 0921 01/27/20 0420  AST 24  --   ALT 21  --   ALKPHOS 85  --   BILITOT 0.8  --   PROT 5.9*  --   ALBUMIN 2.5* 2.1*   No results for input(s): LIPASE, AMYLASE in the last 168 hours. No results for input(s): AMMONIA in the last 168 hours. Diabetic: No results for  input(s): HGBA1C in the last 72 hours. Recent Labs  Lab 01/26/20 1205 01/26/20 1636 01/26/20 2100 01/27/20 0237 01/27/20 0820  GLUCAP 301* 239* 209* 243* 200*   Cardiac Enzymes: No results for input(s): CKTOTAL, CKMB, CKMBINDEX, TROPONINI in the last 168 hours. No results for input(s): PROBNP in the last 8760 hours. Coagulation Profile: No results for input(s): INR, PROTIME in the last 168 hours. Thyroid Function Tests: No results for input(s): TSH, T4TOTAL, FREET4, T3FREE, THYROIDAB in the last 72 hours. Lipid Profile: No results for input(s): CHOL, HDL, LDLCALC, TRIG, CHOLHDL, LDLDIRECT in the last 72 hours. Anemia Panel: No results for input(s): VITAMINB12, FOLATE, FERRITIN, TIBC, IRON, RETICCTPCT in the  last 72 hours. Urine analysis:    Component Value Date/Time   COLORURINE STRAW (A) 01/23/2020 0922   APPEARANCEUR CLEAR (A) 01/23/2020 0922   LABSPEC 1.011 01/23/2020 0922   PHURINE 7.0 01/23/2020 0922   GLUCOSEU >=500 (A) 01/23/2020 0922   HGBUR SMALL (A) 01/23/2020 0922   BILIRUBINUR NEGATIVE 01/23/2020 0922   KETONESUR NEGATIVE 01/23/2020 0922   PROTEINUR >=300 (A) 01/23/2020 0922   UROBILINOGEN 0.2 06/07/2014 0151   NITRITE NEGATIVE 01/23/2020 0922   LEUKOCYTESUR NEGATIVE 01/23/2020 0922   Sepsis Labs: Invalid input(s): PROCALCITONIN, [place]  Microbiology: Recent Results (from the past 240 hour(s))  Respiratory Panel by RT PCR (Flu A&B, Covid) - Nasopharyngeal Swab     Status: None   Collection Time: 01/23/20 12:18 PM   Specimen: Nasopharyngeal Swab  Result Value Ref Range Status   SARS Coronavirus 2 by RT PCR NEGATIVE NEGATIVE Final    Comment: (NOTE) SARS-CoV-2 target nucleic acids are NOT DETECTED. The SARS-CoV-2 RNA is generally detectable in upper respiratoy specimens during the acute phase of infection. The lowest concentration of SARS-CoV-2 viral copies this assay can detect is 131 copies/mL. A negative result does not preclude  SARS-Cov-2 infection and should not be used as the sole basis for treatment or other patient management decisions. A negative result may occur with  improper specimen collection/handling, submission of specimen other than nasopharyngeal swab, presence of viral mutation(s) within the areas targeted by this assay, and inadequate number of viral copies (<131 copies/mL). A negative result must be combined with clinical observations, patient history, and epidemiological information. The expected result is Negative. Fact Sheet for Patients:  PinkCheek.be Fact Sheet for Healthcare Providers:  GravelBags.it This test is not yet ap proved or cleared by the [place] FDA and  has been authorized for detection and/or diagnosis of SARS-CoV-2 by FDA under an Emergency Use Authorization (EUA). This EUA will remain  in effect (meaning this test can be used) for the duration of the COVID-19 declaration under Section 564(b)(1) of the Act, 21 U.S.C. section 360bbb-3(b)(1), unless the authorization is terminated or revoked sooner.    Influenza A by PCR NEGATIVE NEGATIVE Final   Influenza B by PCR NEGATIVE NEGATIVE Final    Comment: (NOTE) The Xpert Xpress SARS-CoV-2/FLU/RSV assay is intended as an aid in  the diagnosis of influenza from Nasopharyngeal swab specimens and  should not be used as a sole basis for treatment. Nasal washings and  aspirates are unacceptable for Xpert Xpress SARS-CoV-2/FLU/RSV  testing. Fact Sheet for Patients: PinkCheek.be Fact Sheet for Healthcare Providers: GravelBags.it This test is not yet approved or cleared by the [place] FDA and  has been authorized for detection and/or diagnosis of SARS-CoV-2 by  FDA under an Emergency Use Authorization (EUA). This EUA will remain  in effect (meaning this test can be used) for the duration of the  Covid-19  declaration under Section 564(b)(1) of the Act, 21  U.S.C. section 360bbb-3(b)(1), unless the authorization is  terminated or revoked. Performed at [facility], [address], [place],  [address]     Radiology Studies: No results found.     Tyshia Fenter T. Killbuck  If 7PM-7AM, please contact night-coverage [email or web address] Password [facility] 01/27/2020, 10:18 AM

## 2020-01-27 NOTE — Progress Notes (Signed)
[name]  MRN: [id number]  DOB/AGE: [birth year] 49 y.o.  Primary Care Physician:Johnston, Chrystie Nose, MD  Admit date: 01/23/2020  Chief Complaint:  Chief Complaint  Patient presents with  . Hyperglycemia    S-Pt presented on  01/23/2020 with  Chief Complaint  Patient presents with  . Hyperglycemia  . Pt main concern was " can I go home ?".  I then educated patient about his current renal issues as well as need to discuss with the primary team about his discharge planning.  Patient was understanding patient other main comment was that I did my research last night on dialysis.  I will go for the access placement once I see my kidney doctor  Meds . amLODipine  10 mg Oral Daily  . aspirin EC  81 mg Oral QODAY  . atorvastatin  80 mg Oral QPM  . carvedilol  25 mg Oral Daily  . docusate sodium  100 mg Oral BID  . heparin  5,000 Units Subcutaneous Q8H  . hydrALAZINE  75 mg Oral Q8H  . insulin aspart  0-15 Units Subcutaneous TID WC  . insulin aspart  0-5 Units Subcutaneous QHS  . insulin aspart  8 Units Subcutaneous TID WC  . insulin detemir  18 Units Subcutaneous BID  . polyethylene glycol  17 g Oral Daily  . promethazine  6.25 mg Intravenous Once  . sodium bicarbonate  650 mg Oral TID         QIO:NGEXB from the symptoms mentioned above,there are no other symptoms referable to all systems reviewed.  Physical Exam: Vital signs in last 24 hours: Temp:  [98.2 F (36.8 C)-99.6 F (37.6 C)] 98.2 F (36.8 C) (04/25 0819) Pulse Rate:  [83-93] 89 (04/25 0819) Resp:  [17-18] 17 (04/25 0819) BP: (113-160)/(56-98) 159/78 (04/25 0819) SpO2:  [94 %-97 %] 95 % (04/25 0819) Weight change:  Last BM Date: 01/24/20  Intake/Output from previous day: 04/24 0701 - 04/25 0700 In: 5506.4 [P.O.:240; I.V.:5266.4] Out: -  Total I/O In: 120 [P.O.:120] Out: -    Physical Exam: General- pt is awake,alert, oriented to time place and person Resp- No acute REsp distress, CTA B/L NO  [place] CVS- S1S2 regular in rate and rhythm GIT- BS+, soft, NT, ND EXT- 1+ LE Edema, No Cyanosis   Lab Results: CBC Recent Labs    01/26/20 0729 01/27/20 0420  WBC 7.0 6.9  HGB 11.3* 11.0*  HCT 34.0* 33.0*  PLT 256 251    BMET Recent Labs    01/26/20 0415 01/27/20 0420  NA 130* 131*  K 5.0 4.6  CL 106 106  CO2 16* 20*  GLUCOSE 239* 207*  BUN 41* 46*  CREATININE 4.21* 4.40*  CALCIUM 8.1* 8.2*    Creatinine trend 2021 3.8--4.2==>4.4 2020 2.7--4.5-acute kidney injury during December admission 2019 1.9--2.0 2016 1.2--1.3     MICRO Recent Results (from the past 240 hour(s))  Respiratory Panel by RT PCR (Flu A&B, Covid) - Nasopharyngeal Swab     Status: None   Collection Time: 01/23/20 12:18 PM   Specimen: Nasopharyngeal Swab  Result Value Ref Range Status   SARS Coronavirus 2 by RT PCR NEGATIVE NEGATIVE Final    Comment: (NOTE) SARS-CoV-2 target nucleic acids are NOT DETECTED. The SARS-CoV-2 RNA is generally detectable in upper respiratoy specimens during the acute phase of infection. The lowest concentration of SARS-CoV-2 viral copies this assay can detect is 131 copies/mL. A negative result does not preclude SARS-Cov-2 infection and should not be used  as the sole basis for treatment or other patient management decisions. A negative result may occur with  improper specimen collection/handling, submission of specimen other than nasopharyngeal swab, presence of viral mutation(s) within the areas targeted by this assay, and inadequate number of viral copies (<131 copies/mL). A negative result must be combined with clinical observations, patient history, and epidemiological information. The expected result is Negative. Fact Sheet for Patients:  PinkCheek.be Fact Sheet for Healthcare Providers:  GravelBags.it This test is not yet ap proved or cleared by the [place] FDA and  has been authorized  for detection and/or diagnosis of SARS-CoV-2 by FDA under an Emergency Use Authorization (EUA). This EUA will remain  in effect (meaning this test can be used) for the duration of the COVID-19 declaration under Section 564(b)(1) of the Act, 21 U.S.C. section 360bbb-3(b)(1), unless the authorization is terminated or revoked sooner.    Influenza A by PCR NEGATIVE NEGATIVE Final   Influenza B by PCR NEGATIVE NEGATIVE Final    Comment: (NOTE) The Xpert Xpress SARS-CoV-2/FLU/RSV assay is intended as an aid in  the diagnosis of influenza from Nasopharyngeal swab specimens and  should not be used as a sole basis for treatment. Nasal washings and  aspirates are unacceptable for Xpert Xpress SARS-CoV-2/FLU/RSV  testing. Fact Sheet for Patients: PinkCheek.be Fact Sheet for Healthcare Providers: GravelBags.it This test is not yet approved or cleared by the [place] FDA and  has been authorized for detection and/or diagnosis of SARS-CoV-2 by  FDA under an Emergency Use Authorization (EUA). This EUA will remain  in effect (meaning this test can be used) for the duration of the  Covid-19 declaration under Section 564(b)(1) of the Act, 21  U.S.C. section 360bbb-3(b)(1), unless the authorization is  terminated or revoked. Performed at [facility], [address], [place],  [address]       Lab Results  Component Value Date   CALCIUM 8.2 (L) 01/27/2020   CAION 1.02 (L) 09/27/2019   PHOS 3.7 01/27/2020               Impression:    Patient is a 49 year old male with a past medical history of CKD stage IV, hypertension, hyperlipidemia, TIA, diabetes mellitus type 1, anemia of chronic disease, secondary hyperparathyroidism who came to the ER with chief complaint of type 1 diabetes mellitus with hyperosmolar-hyperglycemic state  1)Renal   Patient has AKI secondary to ATN Patient has AKI secondary to  uncontrolled hyperglycemia Patient has AKI on CKD Patient has CKD stage IV Patient has CKD since 2011-patient UA showed proteinuria since then Patient has CKD secondary to diabetes mellitus Patient CKD progression has been marked with episodes of acute kidney injury  I did educate patient about the worsening of his CKD.  Patient was understanding   2)HTN Blood pressure is stable   3)Anemia of chronic disease  HGb at goal (9--11)   4) hypocalcemia Patient calcium is 8.2-patient albumin is 2.5 Patient corrected calcium comes out to be 9.6   5)-diabetes mellitus with hyperosmolar hyperglycemic state Primary team is following Patient glucose control is much better than before    6) electrolytes   sodium Hyponatremia secondary to hyperglycemia Hyponatremia is improving  potassium Hyperkalemia  now better    7)Acid base Co2 is not at goal Patient is on PO bicarb Bicarb is now better    Plan:  We will ask for portable chest x-ray to look at the fluid status Depending upon the fluid status We will  give patient gentle IV fluids Educated patient about need to see his nephrologist after discharge. Patient was understanding    Juluis Fitzsimmons s Theador Hawthorne 01/27/2020, 11:11 AM

## 2020-01-28 DIAGNOSIS — E872 Acidosis: Secondary | ICD-10-CM

## 2020-01-28 DIAGNOSIS — N184 Chronic kidney disease, stage 4 (severe): Secondary | ICD-10-CM

## 2020-01-28 DIAGNOSIS — I160 Hypertensive urgency: Secondary | ICD-10-CM

## 2020-01-28 DIAGNOSIS — J449 Chronic obstructive pulmonary disease, unspecified: Secondary | ICD-10-CM

## 2020-01-28 LAB — RENAL FUNCTION PANEL
Albumin: 2.1 g/dL — ABNORMAL LOW (ref 3.5–5.0)
Anion gap: 5 (ref 5–15)
BUN: 49 mg/dL — ABNORMAL HIGH (ref 6–20)
CO2: 21 mmol/L — ABNORMAL LOW (ref 22–32)
Calcium: 8.1 mg/dL — ABNORMAL LOW (ref 8.9–10.3)
Chloride: 108 mmol/L (ref 98–111)
Creatinine, Ser: 4.3 mg/dL — ABNORMAL HIGH (ref 0.61–1.24)
GFR calc Af Amer: 18 mL/min — ABNORMAL LOW (ref >60)
GFR calc non Af Amer: 15 mL/min — ABNORMAL LOW (ref >60)
Glucose, Bld: 133 mg/dL — ABNORMAL HIGH (ref 70–99)
Phosphorus: 4.4 mg/dL (ref 2.5–4.6)
Potassium: 4.4 mmol/L (ref 3.5–5.1)
Sodium: 134 mmol/L — ABNORMAL LOW (ref 135–145)

## 2020-01-28 LAB — CBC
HCT: 32.3 % — ABNORMAL LOW (ref 39.0–52.0)
Hemoglobin: 10.4 g/dL — ABNORMAL LOW (ref 13.0–17.0)
MCH: 28.3 pg (ref 26.0–34.0)
MCHC: 32.2 g/dL (ref 30.0–36.0)
MCV: 88 fL (ref 80.0–100.0)
Platelets: 242 10*3/uL (ref 150–400)
RBC: 3.67 MIL/uL — ABNORMAL LOW (ref 4.22–5.81)
RDW: 13.7 % (ref 11.5–15.5)
WBC: 6.2 10*3/uL (ref 4.0–10.5)
nRBC: 0.3 % — ABNORMAL HIGH (ref 0.0–0.2)

## 2020-01-28 LAB — GLUCOSE, CAPILLARY
Glucose-Capillary: 103 mg/dL — ABNORMAL HIGH (ref 70–99)
Glucose-Capillary: 134 mg/dL — ABNORMAL HIGH (ref 70–99)
Glucose-Capillary: 157 mg/dL — ABNORMAL HIGH (ref 70–99)
Glucose-Capillary: 51 mg/dL — ABNORMAL LOW (ref 70–99)
Glucose-Capillary: 81 mg/dL (ref 70–99)

## 2020-01-28 LAB — MAGNESIUM: Magnesium: 2.3 mg/dL (ref 1.7–2.4)

## 2020-01-28 MED ORDER — CARVEDILOL 25 MG PO TABS: 25.0000 mg | ORAL_TABLET | Freq: Every day | ORAL | 1 refills | Status: DC

## 2020-01-28 MED ORDER — INSULIN ASPART 100 UNIT/ML ~~LOC~~ SOLN
6.0000 [IU] | Freq: Three times a day (TID) | SUBCUTANEOUS | Status: DC
  Filled 2020-01-28: qty 1

## 2020-01-28 MED ORDER — INSULIN DETEMIR 100 UNIT/ML ~~LOC~~ SOLN
15.0000 [IU] | Freq: Two times a day (BID) | SUBCUTANEOUS | Status: DC
  Filled 2020-01-28 (×2): qty 0.15

## 2020-01-28 MED ORDER — FUROSEMIDE 40 MG PO TABS: 40.0000 mg | ORAL_TABLET | Freq: Every day | ORAL | Status: DC | PRN

## 2020-01-28 NOTE — Plan of Care (Signed)
  Problem: Health Behavior/Discharge Planning: Goal: Ability to manage health-related needs will improve Outcome: Progressing   Problem: Clinical Measurements: Goal: Ability to maintain clinical measurements within normal limits will improve Outcome: Progressing Goal: Will remain free from infection Outcome: Progressing   Problem: Health Behavior/Discharge Planning: Goal: Ability to manage health-related needs will improve Outcome: Progressing   Problem: Clinical Measurements: Goal: Ability to maintain clinical measurements within normal limits will improve Outcome: Progressing

## 2020-01-28 NOTE — Discharge Summary (Signed)
Physician Discharge Summary  [name] OTL:[id number] DOB: 26-Jan-1971 DOA: 01/23/2020  PCP: [name], MD  Admit date: 01/23/2020 Discharge date: 01/28/2020  Admitted From: Home Disposition: Home  Recommendations for Outpatient Follow-up:  1. Follow ups as below. 2. Please obtain CBC/BMP/Mag at follow up 3. Please follow up on the following pending results: None  Home Health: None Equipment/Devices: None  Discharge Condition: Stable CODE STATUS: Full code    Hospital Course: 49 year old male with history of HTN, HLD, DM-1 on insulin pump, CKD-4, PVC and TIA presented with generalized weakness and "abnormal labs" and admitted for accelerated HTN, hyperglycemia, hyponatremia and AKI on CKD-4.  Started on IV fluids and insulin drip.  Eventually transitioned to subcu insulin but remained hyperglycemic for quite some time.  Eventually, hyperglycemia improved to within acceptable range.  He felt well and ready to go home and follow-up with his endocrinologist.  He was highly encouraged to call his endocrinologist office arrange outpatient follow-up as soon as possible.  In regards to his AKI/CKD-4, he was cleared for discharge by nephrology with a plan for outpatient follow-up.  He is hyponatremia and hypertension improved.  See individual problem list below for more on hospital course.  Discharge Diagnoses:  Uncontrolled hypertension with hyperglycemia and hypoglycemia: A1c 10.5%. Recent Labs    01/28/20 0356 01/28/20 0452 01/28/20 0831  GLUCAP 51* 81 157*  -Patient to resume insulin pump at home and call endocrinologist if there is a problem with his insulin pump. -Continue home statin.  AKI on CKD-4/azotemia: Baseline Cr 2.6-3.0 but elevated values in the 4 range previously> 4.11 (admit)> 4.21>4.4> 4.3.  Mild azotemia but no uremic symptoms. On p.o. Lasix 40 mg daily at home.  No other nephrotoxic meds on his med list. -Cleared for discharge by nephrology  for outpatient follow-up. -Changed home Lasix to as needed. -Recheck renal panel at follow-up.  Metabolic acidosis: likely due to renal failure -Recheck renal panel at follow-up.  Anemia of renal disease: H&H stable. -Recheck CBC at follow-up.  Hyponatremia: baseline Na 130-135> 126 on admission partly due to hyperglycemia>> 131> 134  Hyperkalemia: Resolved.  Essential hypertension: Normotensive for most part. -Increase carvedilol to 25 mg twice daily. -Continue with his other home antihypertensive regimen -Changed home Lasix to as needed.  OSA: On CPAP at home. -Continue home CPAP.  Tension headache: Improved.  No red flags.  Class II obesity: Body mass index is 33.19 kg/m. -Encourage lifestyle change to lose weight.                  Discharge Instructions  Discharge Instructions    Call MD for:  difficulty breathing, headache or visual disturbances   Complete by: As directed    Call MD for:  extreme fatigue   Complete by: As directed    Call MD for:  persistant nausea and vomiting   Complete by: As directed    Diet - low sodium heart healthy   Complete by: As directed    Diet Carb Modified   Complete by: As directed    Discharge instructions   Complete by: As directed    It has been a pleasure taking care of you! You were hospitalized with elevated blood sugar and acute on chronic kidney failure. Your blood glucose has improved to the point we think it is safe to let you go home and follow up with your endocrinologist as soon as possible. Please follow up with your kidney doctors as well. Please review your  new medication list and the directions before you take your medications.  Take care,   Increase activity slowly   Complete by: As directed      Allergies as of 01/28/2020      Reactions   Oxycontin [oxycodone Hcl] Nausea And Vomiting   Penicillins Other (See Comments)   Possible reaction many years ago per patient   Prednisone Other  (See Comments)   Patient is diabetic, runs sugar up   Shellfish-derived Products    Pt is not allergic to iodine, has had iodine in the past w/o premeds and w/ no problems      Medication List    TAKE these medications   acetaminophen 500 MG tablet Commonly known as: TYLENOL Take 500-1,000 mg by mouth every 6 (six) hours as needed for mild pain or fever. Notes to patient: Last dose given today at 6:18 AM   amLODipine 10 MG tablet Commonly known as: NORVASC Take 1 tablet (10 mg total) by mouth daily. Notes to patient: Last dose given today at 9:26 AM   aspirin 81 MG EC tablet Take 81 mg by mouth every other day. Notes to patient: Last dose given yesterday at 8:59 AM   atorvastatin 80 MG tablet Commonly known as: LIPITOR Take 80 mg by mouth every evening. Notes to patient: Last dose given yesterday at 5:29 PM   carvedilol 25 MG tablet Commonly known as: COREG Take 1 tablet (25 mg total) by mouth daily. What changed:   medication strength  how much to take  when to take this Notes to patient: Last dose given today at 9:26 AM   furosemide 40 MG tablet Commonly known as: LASIX Take 1 tablet (40 mg total) by mouth daily as needed for fluid or edema. What changed:   when to take this  reasons to take this Notes to patient: Not given in hospital   gentamicin cream 0.1 % Commonly known as: GARAMYCIN Apply 1 application topically 2 (two) times daily. Notes to patient: Not given in hospital   HumaLOG 100 UNIT/ML injection Generic drug: insulin lispro Inject 100 Units into the skin daily. Notes to patient: Not given in hospital   hydrALAZINE 50 MG tablet Commonly known as: APRESOLINE Take 0.5 tablets (25 mg total) by mouth 3 (three) times daily. What changed:   how much to take  when to take this Notes to patient: Last dose given today at 5:56 AM   pantoprazole 40 MG tablet Commonly known as: Protonix Take 1 tablet (40 mg total) by mouth daily. What  changed:   when to take this  reasons to take this Notes to patient: Not given in hospital   sodium chloride 0.65 % Soln nasal spray Commonly known as: OCEAN Place 1 spray into both nostrils as needed for congestion. Notes to patient: Last dose given 01/26/2020       Consultations:  Nephrology  Procedures/Studies:   DG Chest 1 View  Result Date: 01/27/2020 CLINICAL DATA:  Acute tubular necrosis. EXAM: CHEST  1 VIEW COMPARISON:  September 27, 2019 FINDINGS: The heart size and mediastinal contours are within normal limits. Both lungs are clear. The visualized skeletal structures are unremarkable. IMPRESSION: No active disease. Electronically Signed   By: Dorise Bullion III M.D   On: 01/27/2020 15:30       Discharge Exam: Vitals:   01/28/20 0354 01/28/20 0830  BP: 138/63 (!) 156/72  Pulse: 83 92  Resp: 17 17  Temp: 98.5 F (36.9 C) 98.8 F (  37.1 C)  SpO2: 94% 93%    GENERAL: No acute distress.  Appears well.  HEENT: MMM.  Vision and hearing grossly intact.  NECK: Supple.  No apparent JVD.  RESP:  No IWOB. Good air movement bilaterally. CVS:  RRR. Heart sounds normal.  ABD/GI/GU: Bowel sounds present. Soft. Non tender.  MSK/EXT:  Moves extremities. No apparent deformity or edema.  SKIN: no apparent skin lesion or wound NEURO: Awake, alert and oriented appropriately.  No apparent focal neuro deficit. PSYCH: Calm. Normal affect.   The results of significant diagnostics from this hospitalization (including imaging, microbiology, ancillary and laboratory) are listed below for reference.     Microbiology: Recent Results (from the past 240 hour(s))  Respiratory Panel by RT PCR (Flu A&B, Covid) - Nasopharyngeal Swab     Status: None   Collection Time: 01/23/20 12:18 PM   Specimen: Nasopharyngeal Swab  Result Value Ref Range Status   SARS Coronavirus 2 by RT PCR NEGATIVE NEGATIVE Final    Comment: (NOTE) SARS-CoV-2 target nucleic acids are NOT DETECTED. The  SARS-CoV-2 RNA is generally detectable in upper respiratoy specimens during the acute phase of infection. The lowest concentration of SARS-CoV-2 viral copies this assay can detect is 131 copies/mL. A negative result does not preclude SARS-Cov-2 infection and should not be used as the sole basis for treatment or other patient management decisions. A negative result may occur with  improper specimen collection/handling, submission of specimen other than nasopharyngeal swab, presence of viral mutation(s) within the areas targeted by this assay, and inadequate number of viral copies (<131 copies/mL). A negative result must be combined with clinical observations, patient history, and epidemiological information. The expected result is Negative. Fact Sheet for Patients:  PinkCheek.be Fact Sheet for Healthcare Providers:  GravelBags.it This test is not yet ap proved or cleared by the [place] FDA and  has been authorized for detection and/or diagnosis of SARS-CoV-2 by FDA under an Emergency Use Authorization (EUA). This EUA will remain  in effect (meaning this test can be used) for the duration of the COVID-19 declaration under Section 564(b)(1) of the Act, 21 U.S.C. section 360bbb-3(b)(1), unless the authorization is terminated or revoked sooner.    Influenza A by PCR NEGATIVE NEGATIVE Final   Influenza B by PCR NEGATIVE NEGATIVE Final    Comment: (NOTE) The Xpert Xpress SARS-CoV-2/FLU/RSV assay is intended as an aid in  the diagnosis of influenza from Nasopharyngeal swab specimens and  should not be used as a sole basis for treatment. Nasal washings and  aspirates are unacceptable for Xpert Xpress SARS-CoV-2/FLU/RSV  testing. Fact Sheet for Patients: PinkCheek.be Fact Sheet for Healthcare Providers: GravelBags.it This test is not yet approved or cleared by the [place] FDA and  has been authorized for detection and/or diagnosis of SARS-CoV-2 by  FDA under an Emergency Use Authorization (EUA). This EUA will remain  in effect (meaning this test can be used) for the duration of the  Covid-19 declaration under Section 564(b)(1) of the Act, 21  U.S.C. section 360bbb-3(b)(1), unless the authorization is  terminated or revoked. Performed at [facility], [address] [place], [place] [address]      Labs: BNP (last 3 results) Recent Labs    01/23/20 0921  BNP 69.4   Basic Metabolic Panel: Recent Labs  Lab 01/24/20 0509 01/25/20 0340 01/26/20 0415 01/27/20 0420 01/28/20 0719  NA 135 131* 130* 131* 134*  K 4.3 4.5 5.0 4.6 4.4  CL 107 105 106 106  108  CO2 22 20* 16* 20* 21*  GLUCOSE 196* 377* 239* 207* 133*  BUN 42* 43* 41* 46* 49*  CREATININE 3.79* 4.21* 4.21* 4.40* 4.30*  CALCIUM 8.5* 7.9* 8.1* 8.2* 8.1*  MG  --  2.2  --  2.1 2.3  PHOS  --   --   --  3.7 4.4   Liver Function Tests: Recent Labs  Lab 01/23/20 0921 01/27/20 0420 01/28/20 0719  AST 24  --   --   ALT 21  --   --   ALKPHOS 85  --   --   BILITOT 0.8  --   --   PROT 5.9*  --   --   ALBUMIN 2.5* 2.1* 2.1*   No results for input(s): LIPASE, AMYLASE in the last 168 hours. No results for input(s): AMMONIA in the last 168 hours. CBC: Recent Labs  Lab 01/23/20 0921 01/23/20 0921 01/24/20 0509 01/25/20 0340 01/26/20 0729 01/27/20 0420 01/28/20 0719  WBC 6.9   < > 5.3 6.4 7.0 6.9 6.2  NEUTROABS 4.0  --   --   --   --   --   --   HGB 11.9*   < > 11.7* 11.2* 11.3* 11.0* 10.4*  HCT 36.0*   < > 34.0* 34.7* 34.0* 33.0* 32.3*  MCV 87.0   < > 84.2 89.0 87.4 86.6 88.0  PLT 285   < > 251 254 256 251 242   < > = values in this interval not displayed.   Cardiac Enzymes: No results for input(s): CKTOTAL, CKMB, CKMBINDEX, TROPONINI in the last 168 hours. BNP: Invalid input(s): POCBNP CBG: Recent Labs  Lab 01/27/20 2010 01/27/20 2359 01/28/20 0356  01/28/20 0452 01/28/20 0831  GLUCAP 134* 103* 51* 81 157*   D-Dimer No results for input(s): DDIMER in the last 72 hours. Hgb A1c No results for input(s): HGBA1C in the last 72 hours. Lipid Profile No results for input(s): CHOL, HDL, LDLCALC, TRIG, CHOLHDL, LDLDIRECT in the last 72 hours. Thyroid function studies No results for input(s): TSH, T4TOTAL, T3FREE, THYROIDAB in the last 72 hours.  Invalid input(s): FREET3 Anemia work up No results for input(s): VITAMINB12, FOLATE, FERRITIN, TIBC, IRON, RETICCTPCT in the last 72 hours. Urinalysis    Component Value Date/Time   COLORURINE STRAW (A) 01/23/2020 0922   APPEARANCEUR CLEAR (A) 01/23/2020 0922   LABSPEC 1.011 01/23/2020 0922   PHURINE 7.0 01/23/2020 0922   GLUCOSEU >=500 (A) 01/23/2020 0922   HGBUR SMALL (A) 01/23/2020 0922   BILIRUBINUR NEGATIVE 01/23/2020 0922   KETONESUR NEGATIVE 01/23/2020 0922   PROTEINUR >=300 (A) 01/23/2020 0922   UROBILINOGEN 0.2 06/07/2014 0151   NITRITE NEGATIVE 01/23/2020 0922   LEUKOCYTESUR NEGATIVE 01/23/2020 0922   Sepsis Labs Invalid input(s): PROCALCITONIN,  WBC,  LACTICIDVEN   Time coordinating discharge: 35 minutes  SIGNED:  Mercy Riding, MD  [facility] 01/28/2020, 7:27 PM  If 7PM-7AM, please contact night-coverage [email or web address] Password [email or web address]

## 2020-01-29 ENCOUNTER — Other Ambulatory Visit: Payer: Self-pay | Admitting: *Deleted

## 2020-01-29 ENCOUNTER — Ambulatory Visit: Payer: 59 | Admitting: Cardiovascular Disease

## 2020-01-29 NOTE — Patient Outreach (Signed)
[place] [facility]) Care Management  01/29/2020  [name] [birth year] [id number]   Transition of care telephone call  Referral received:01/25/20 Initial outreach:01/29/20 Insurance: [facility]   Initial unsuccessful telephone call to patient's preferred number in order to complete transition of care assessment; no answer, left HIPAA compliant voicemail message requesting return call.   Objective:  Per electronic record , [name] was hospitalized at [facility] from 4/21-4/26/2for Diabetic ketoacidosis, Acute Renal failure superimposed on chronic kidney disease.  Comorbidities include: Uncontrolled Type 1 Diabetes on insulin pump, stage 4 chronic kidney disease, Hypertension , Diabetes neuropathy, nephropathy and retinopathy.  He was discharged to home on 01/28/20 without the need for home health services or DME.  Plan: This RNCM will route unsuccessful outreach letter with [place] Management pamphlet and 24 hour Nurse Advice Line Magnet to [place] Management clinical pool to be mailed to patient's home address. This RNCM will attempt another outreach within 4 business days.   Joylene Draft, RN, BSN  [place] Management Coordinator  [phone]- Mobile [phone]- Toll Free Main Office

## 2020-02-01 ENCOUNTER — Other Ambulatory Visit: Payer: Self-pay | Admitting: *Deleted

## 2020-02-01 NOTE — Patient Outreach (Signed)
[place] [facility]) Care Management  02/01/2020  CAMARI WISHAM Feb 25, 1971 [id number]   Transition of care call  Referral received: 01/25/20 Initial outreach attempt: 01/29/20 Insurance: [employer]   2nd unsuccessful telephone call to patient's preferred contact number in order to complete post hospital discharge transition of care assessment , no answer voicemail box is full unable to leave a message.    Objective: Per electronic record , Mr. Grimley hospitalized at [facility] from 4/21-4/26/32for Diabetic ketoacidosis, Acute Renal failure superimposed on chronic kidney disease.  Comorbidities include: Uncontrolled Type 1 Diabetes on insulin pump, stage 4 chronic kidney disease, Hypertension , Diabetes neuropathy, nephropathy and retinopathy.  He was discharged to home on 01/28/20 without the need for home health servicesor DME.   Plan If no return call from patient will attempt 3rd outreach in the next 4 business days.    Joylene Draft, RN, BSN  [place] Management Coordinator  [phone]- Mobile [phone]- Toll Free Main Office

## 2020-02-04 DIAGNOSIS — E1022 Type 1 diabetes mellitus with diabetic chronic kidney disease: Secondary | ICD-10-CM | POA: Diagnosis not present

## 2020-02-04 DIAGNOSIS — I10 Essential (primary) hypertension: Secondary | ICD-10-CM | POA: Diagnosis not present

## 2020-02-04 DIAGNOSIS — N2581 Secondary hyperparathyroidism of renal origin: Secondary | ICD-10-CM | POA: Diagnosis not present

## 2020-02-04 DIAGNOSIS — D631 Anemia in chronic kidney disease: Secondary | ICD-10-CM | POA: Diagnosis not present

## 2020-02-04 DIAGNOSIS — N184 Chronic kidney disease, stage 4 (severe): Secondary | ICD-10-CM | POA: Diagnosis not present

## 2020-02-04 DIAGNOSIS — N179 Acute kidney failure, unspecified: Secondary | ICD-10-CM | POA: Diagnosis not present

## 2020-02-06 ENCOUNTER — Other Ambulatory Visit: Payer: Self-pay | Admitting: *Deleted

## 2020-02-06 NOTE — Patient Outreach (Signed)
[place] [facility]) Care Management  02/06/2020  [name] 1970/10/14 [id number]   Transition of care call  Referral received: 01/25/20 Initial outreach attempt: 01/29/20 Insurance: [place] unsuccessful telephone call to patient's preferred contact number in order to complete post hospital discharge transition of care assessment; no answer, mailbox is full, unable to leave a message. Placed call to home number listed , unable to receive call per message.   Objective: Per electronic record ,[name] hospitalized at [facility] from4/21-4/26/23for Diabetic ketoacidosis, Acute Renal failure superimposed on chronic kidney disease.Comorbidities include: Uncontrolled Type 1 Diabetes on insulin pump, stage 4 chronic kidney disease, Hypertension , Diabetes neuropathy, nephropathy and retinopathy. He was discharged to home on4/26/21without the need for home health servicesor DME. Noted that patient has attended office visit with Nephrology on 02/01/20.   Plan: If no return call from patient, will close case to [place] Management services in 10 business days after initial post hospital discharge outreach, on 01/25/20.   Joylene Draft, RN, BSN  [place] Management Coordinator  [phone]- Mobile [phone]- Toll Free Main Office

## 2020-02-11 ENCOUNTER — Ambulatory Visit
Admission: RE | Admit: 2020-02-11 | Discharge: 2020-02-11 | Disposition: A | Discharge status: Home / Self Care | Payer: 59 | Source: Ambulatory Visit | Attending: Nephrology | Admitting: Nephrology

## 2020-02-11 ENCOUNTER — Other Ambulatory Visit: Payer: Self-pay | Admitting: *Deleted

## 2020-02-11 DIAGNOSIS — R600 Localized edema: Secondary | ICD-10-CM | POA: Insufficient documentation

## 2020-02-11 DIAGNOSIS — N184 Chronic kidney disease, stage 4 (severe): Secondary | ICD-10-CM | POA: Insufficient documentation

## 2020-02-11 MED ORDER — FUROSEMIDE 10 MG/ML IJ SOLN: 80.0000 mg | Freq: Once | INTRAMUSCULAR | Status: AC

## 2020-02-11 MED ORDER — FUROSEMIDE 10 MG/ML IJ SOLN
INTRAMUSCULAR | Status: AC
  Administered 2020-02-11: 10:00:00 80 mg via INTRAVENOUS
  Filled 2020-02-11: qty 8

## 2020-02-11 NOTE — Patient Outreach (Signed)
[place] [facility]) Care Management  02/11/2020  [name] 1971-05-25 [id number]   Transition of care /Case Closure Unsuccessful outreach    Referral received:01/25/20 Initial outreach:01/29/20 Insurance: [employer]    Unable to complete post hospital discharge transition of care assessment. No return call form patient after 3 call attempts and no response to request to contact RN Care Coordinator in unsuccessful outreach letter mailed to home on 01/29/20.  Objective: Per electronic record ,[name] hospitalized at [facility] from4/21-4/26/31for Diabetic ketoacidosis, Acute Renal failure superimposed on chronic kidney disease.Comorbidities include: Uncontrolled Type 1 Diabetes on insulin pump, stage 4 chronic kidney disease, Hypertension , Diabetes neuropathy, nephropathy and retinopathy. He was discharged to home on4/26/21without the need for home health servicesor DME. Noted that patient has attended office visit with Nephrology on 02/01/20. Per review of [employer] patient is enrolled in Chronic Disease management program with [employer] .   Plan Case closed to [facility] [employer] as it has been 10 days since initial post discharge outreach attempt.    Joylene Draft, RN, BSN  [place] Management Coordinator  [phone]- Mobile [phone]- Toll Free Main Office

## 2020-02-11 NOTE — Discharge Instructions (Signed)
Chronic Kidney Disease, Adult Chronic kidney disease (CKD) happens when the kidneys are damaged over a long period of time. The kidneys are two organs that help with:  Getting rid of waste and extra fluid from the blood.  Making hormones that maintain the amount of fluid in your tissues and blood vessels.  Making sure that the body has the right amount of fluids and chemicals. Most of the time, CKD does not go away, but it can usually be controlled. Steps must be taken to slow down the kidney damage or to stop it from getting worse. If this is not done, the kidneys may stop working. Follow these instructions at home: Medicines  Take over-the-counter and prescription medicines only as told by your doctor. You may need to change the amount of medicines you take.  Do not take any new medicines unless your doctor says it is okay. Many medicines can make your kidney damage worse.  Do not take any vitamin and supplements unless your doctor says it is okay. Many vitamins and supplements can make your kidney damage worse. General instructions  Follow a diet as told by your doctor. You may need to stay away from: ? Alcohol. ? Salty foods. ? Foods that are high in:  Potassium.  Calcium.  Protein.  Do not use any products that contain nicotine or tobacco, such as cigarettes and e-cigarettes. If you need help quitting, ask your doctor.  Keep track of your blood pressure at home. Tell your doctor about any changes.  If you have diabetes, keep track of your blood sugar as told by your doctor.  Try to stay at a healthy weight. If you need help, ask your doctor.  Exercise at least 30 minutes a day, 5 days a week.  Stay up-to-date with your shots (immunizations) as told by your doctor.  Keep all follow-up visits as told by your doctor. This is important. Contact a doctor if:  Your symptoms get worse.  You have new symptoms. Get help right away if:  You have symptoms of end-stage  kidney disease. These may include: ? Headaches. ? Numbness in your hands or feet. ? Easy bruising. ? Having hiccups often. ? Chest pain. ? Shortness of breath. ? Stopping of menstrual periods in women.  You have a fever.  You have very little pee (urine).  You have pain or bleeding when you pee. Summary  Chronic kidney disease (CKD) happens when the kidneys are damaged over a long period of time.  Most of the time, this condition does not go away, but it can usually be controlled. Steps must be taken to slow down the kidney damage or to stop it from getting worse.  Treatment may include a combination of medicines and lifestyle changes. This information is not intended to replace advice given to you by your health care provider. Make sure you discuss any questions you have with your health care provider. Document Revised: 09/02/2017 Document Reviewed: 10/25/2016 Elsevier Patient Education  2020 Elsevier Inc.  

## 2020-02-12 ENCOUNTER — Ambulatory Visit
Admission: RE | Admit: 2020-02-12 | Discharge: 2020-02-12 | Disposition: A | Discharge status: Home / Self Care | Payer: 59 | Source: Ambulatory Visit | Attending: Nephrology | Admitting: Nephrology

## 2020-02-12 ENCOUNTER — Other Ambulatory Visit: Payer: Self-pay

## 2020-02-12 DIAGNOSIS — R600 Localized edema: Secondary | ICD-10-CM | POA: Diagnosis not present

## 2020-02-12 DIAGNOSIS — N184 Chronic kidney disease, stage 4 (severe): Secondary | ICD-10-CM | POA: Insufficient documentation

## 2020-02-12 MED ORDER — FUROSEMIDE 10 MG/ML IJ SOLN
80.0000 mg | Freq: Once | INTRAMUSCULAR | Status: AC
  Administered 2020-02-12: 80 mg via INTRAVENOUS

## 2020-02-13 ENCOUNTER — Ambulatory Visit
Admission: RE | Admit: 2020-02-13 | Discharge: 2020-02-13 | Disposition: A | Discharge status: Home / Self Care | Payer: 59 | Source: Ambulatory Visit | Attending: Nephrology | Admitting: Nephrology

## 2020-02-13 DIAGNOSIS — N184 Chronic kidney disease, stage 4 (severe): Secondary | ICD-10-CM | POA: Insufficient documentation

## 2020-02-13 DIAGNOSIS — R600 Localized edema: Secondary | ICD-10-CM | POA: Diagnosis not present

## 2020-02-13 MED ORDER — FUROSEMIDE 10 MG/ML IJ SOLN
INTRAMUSCULAR | Status: AC
  Administered 2020-02-13: 80 mg via INTRAVENOUS
  Filled 2020-02-13: qty 8

## 2020-02-13 MED ORDER — FUROSEMIDE 10 MG/ML IJ SOLN: 80.0000 mg | Freq: Once | INTRAMUSCULAR | Status: AC

## 2020-02-29 DIAGNOSIS — N2581 Secondary hyperparathyroidism of renal origin: Secondary | ICD-10-CM | POA: Diagnosis not present

## 2020-02-29 DIAGNOSIS — D631 Anemia in chronic kidney disease: Secondary | ICD-10-CM | POA: Diagnosis not present

## 2020-02-29 DIAGNOSIS — I10 Essential (primary) hypertension: Secondary | ICD-10-CM | POA: Diagnosis not present

## 2020-02-29 DIAGNOSIS — N184 Chronic kidney disease, stage 4 (severe): Secondary | ICD-10-CM | POA: Diagnosis not present

## 2020-02-29 DIAGNOSIS — E1022 Type 1 diabetes mellitus with diabetic chronic kidney disease: Secondary | ICD-10-CM | POA: Diagnosis not present

## 2020-03-28 DIAGNOSIS — E1021 Type 1 diabetes mellitus with diabetic nephropathy: Secondary | ICD-10-CM | POA: Diagnosis not present

## 2020-03-28 DIAGNOSIS — E1022 Type 1 diabetes mellitus with diabetic chronic kidney disease: Secondary | ICD-10-CM | POA: Diagnosis not present

## 2020-03-28 DIAGNOSIS — E1065 Type 1 diabetes mellitus with hyperglycemia: Secondary | ICD-10-CM | POA: Diagnosis not present

## 2020-03-28 DIAGNOSIS — E1040 Type 1 diabetes mellitus with diabetic neuropathy, unspecified: Secondary | ICD-10-CM | POA: Diagnosis not present

## 2020-04-02 ENCOUNTER — Other Ambulatory Visit: Payer: Self-pay | Admitting: Nephrology

## 2020-04-02 DIAGNOSIS — D631 Anemia in chronic kidney disease: Secondary | ICD-10-CM | POA: Diagnosis not present

## 2020-04-02 DIAGNOSIS — I10 Essential (primary) hypertension: Secondary | ICD-10-CM | POA: Diagnosis not present

## 2020-04-02 DIAGNOSIS — E1022 Type 1 diabetes mellitus with diabetic chronic kidney disease: Secondary | ICD-10-CM | POA: Diagnosis not present

## 2020-04-02 DIAGNOSIS — N184 Chronic kidney disease, stage 4 (severe): Secondary | ICD-10-CM | POA: Diagnosis not present

## 2020-04-02 DIAGNOSIS — N2581 Secondary hyperparathyroidism of renal origin: Secondary | ICD-10-CM | POA: Diagnosis not present

## 2020-04-09 DIAGNOSIS — E1065 Type 1 diabetes mellitus with hyperglycemia: Secondary | ICD-10-CM | POA: Diagnosis not present

## 2020-04-16 IMAGING — DX DG CHEST 1V PORT
1 series · 1 of 1 positions shown · non-contrast
Comparison: 02/03/2019

CLINICAL DATA: Diabetic ketoacidosis.  Weakness.

EXAM:
PORTABLE CHEST 1 VIEW

[chest ap]
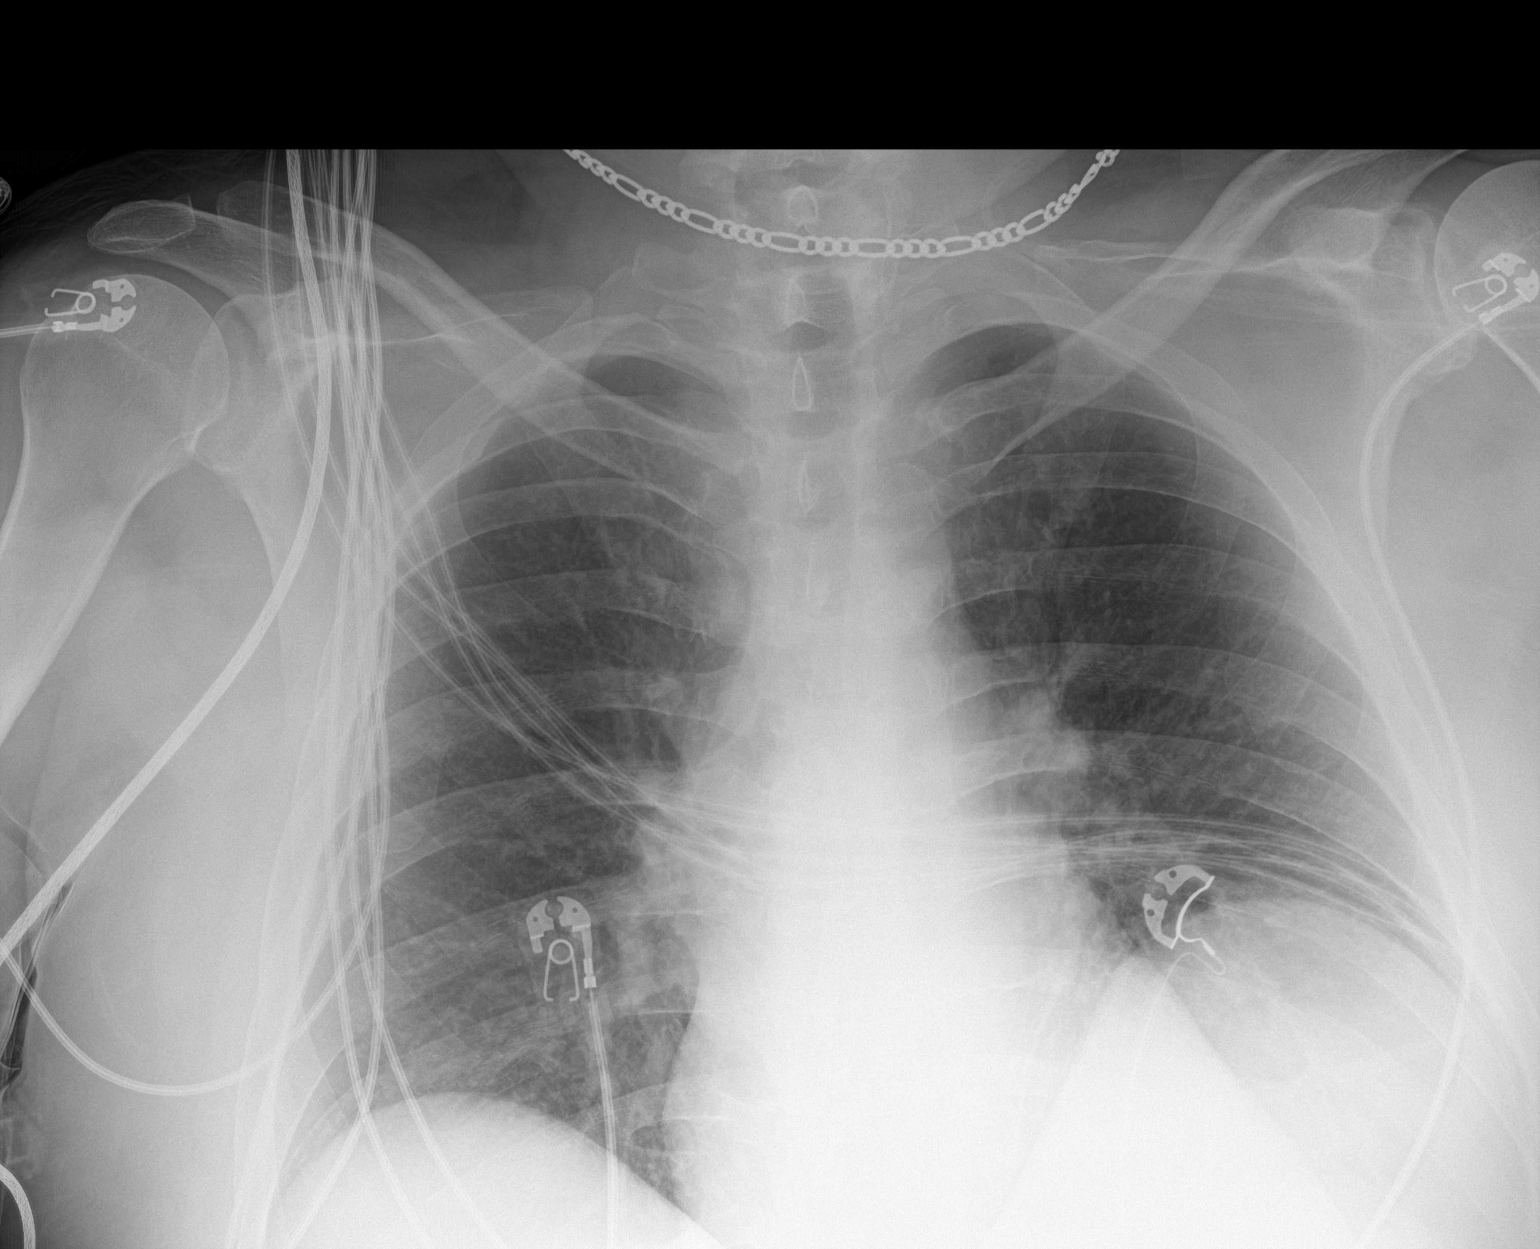

[1 of 1 positions shown; findings below may reference images not displayed]

FINDINGS: The cardiac silhouette, mediastinal and hilar contours are within
normal limits and stable. Stable eventration of both hemidiaphragms,
left greater than right with some overlying vascular crowding and
streaky atelectasis. No infiltrates or effusions. No worrisome
pulmonary lesions. The bony thorax is intact.
IMPRESSION: No acute cardiopulmonary findings.

## 2020-04-17 IMAGING — US US RENAL
1 series · 14 of 25 positions shown · non-contrast
Comparison: Renal ultrasound dated 07/13/2017.

CLINICAL DATA: 48-year-old male with acute renal insufficiency.
History of diabetes and hypertension.

EXAM:
RENAL / URINARY TRACT ULTRASOUND COMPLETE

[Series 1: us renal · 14 of 67 slices shown]
[im 1/67]
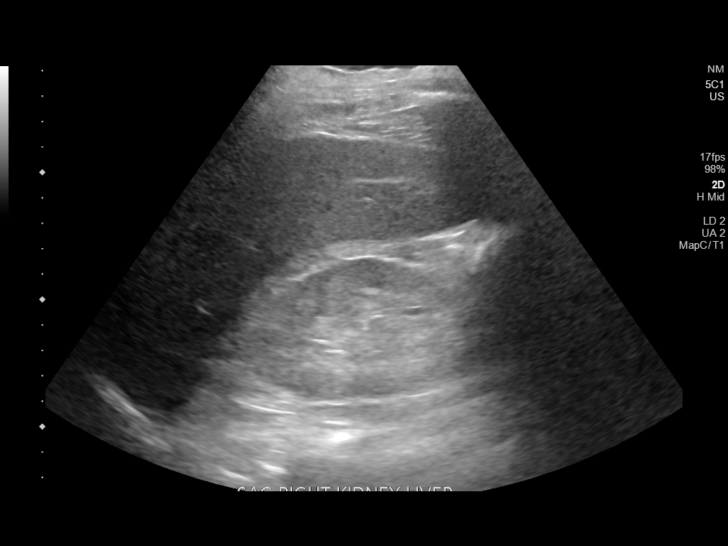
[im 6/67]
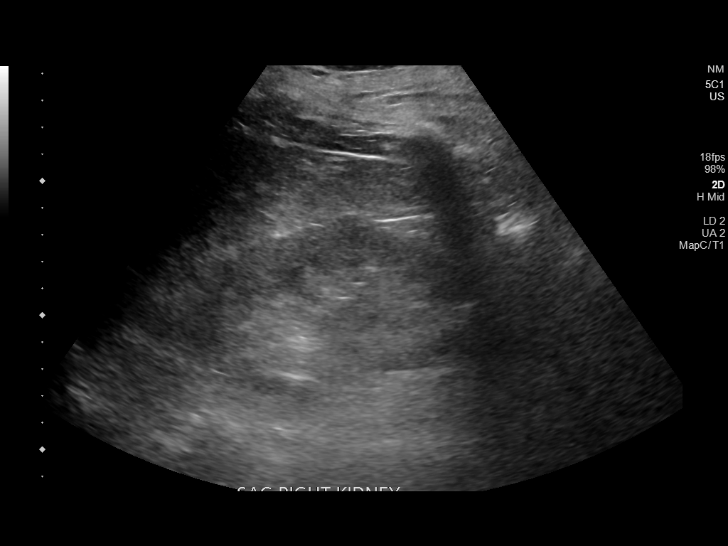
[im 12/67]
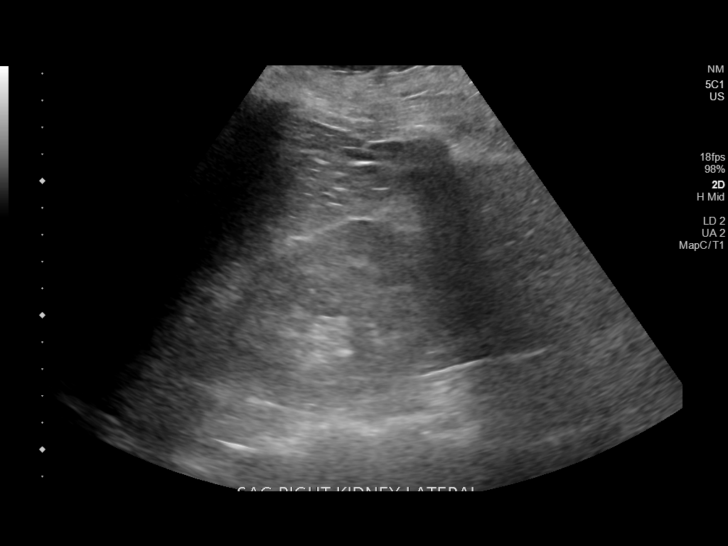
[im 17/67]
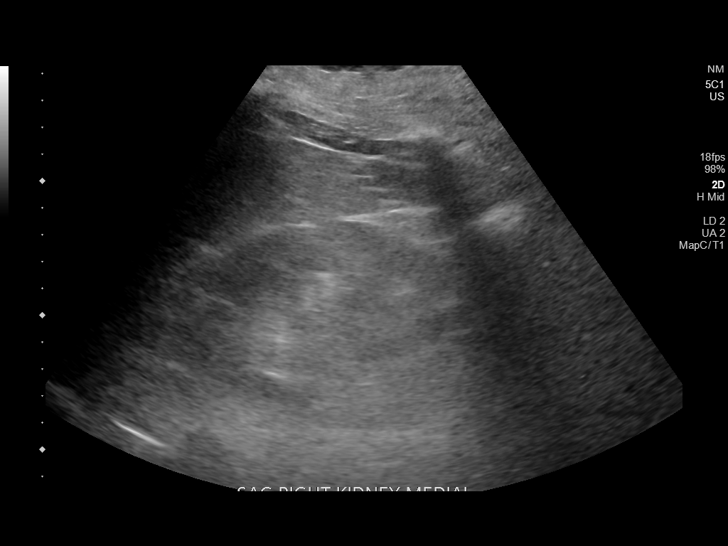
[im 23/67]
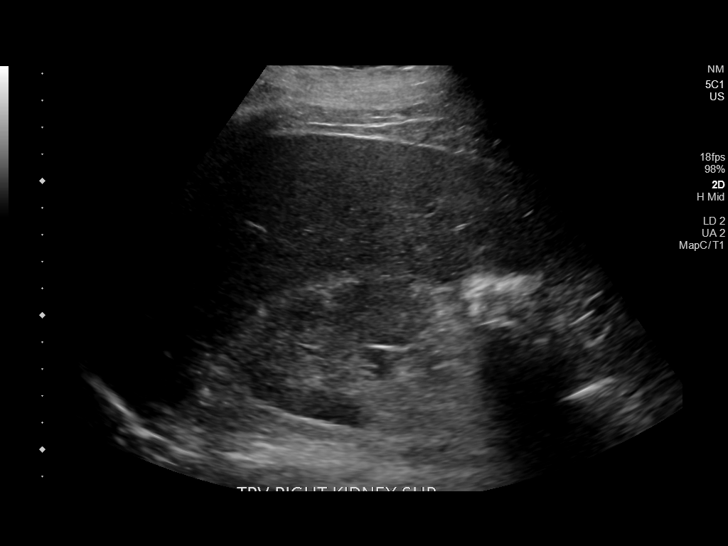
[im 25/67]
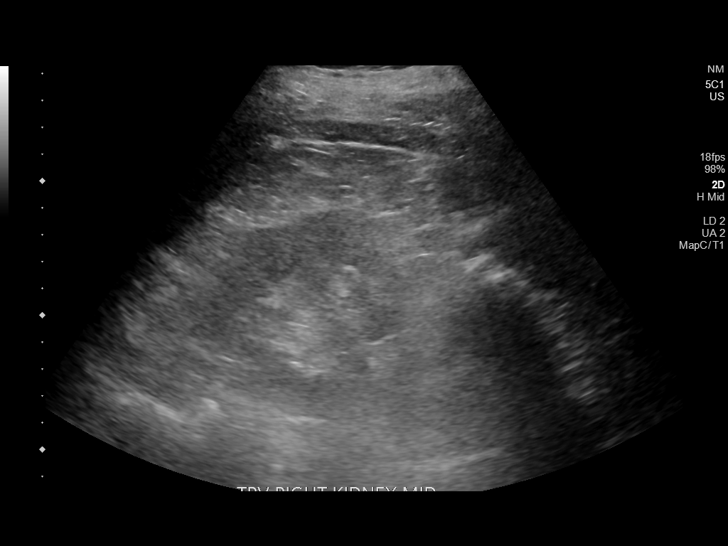
[im 31/67]
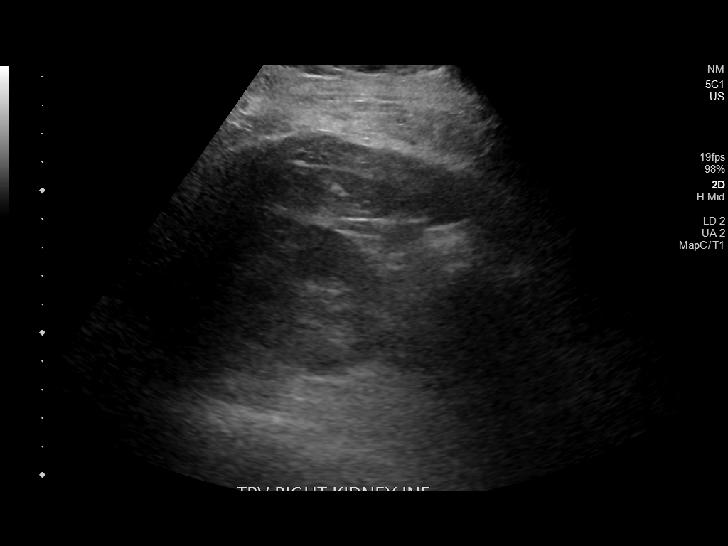
[im 36/67]
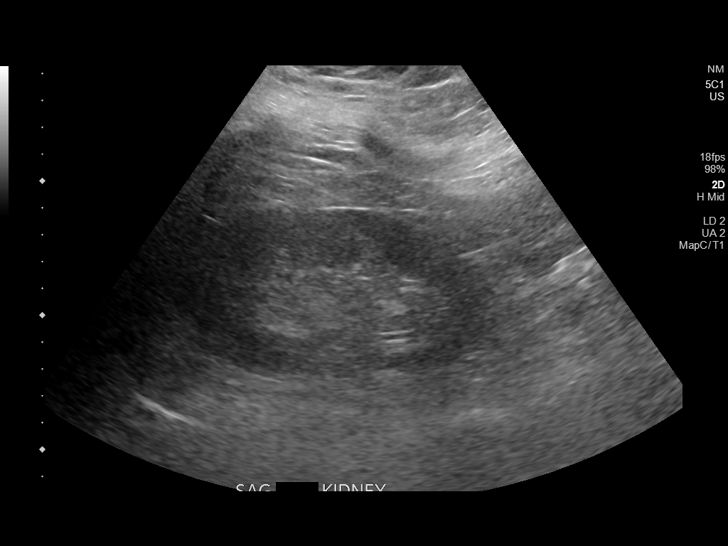
[im 42/67]
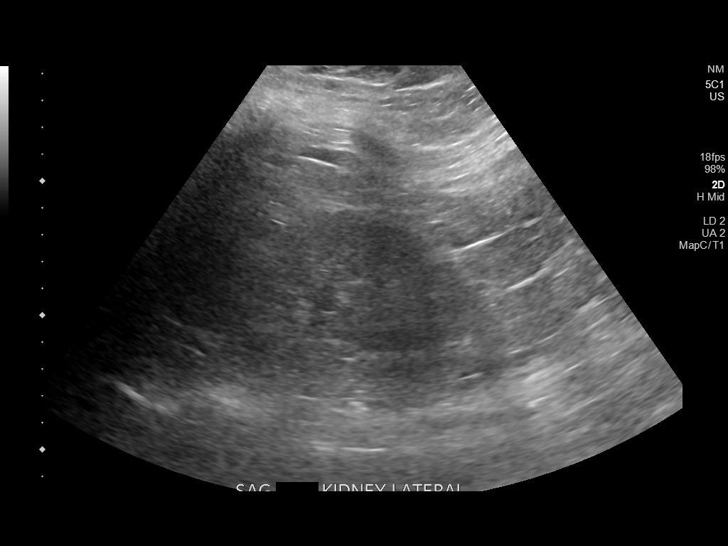
[im 45/67]
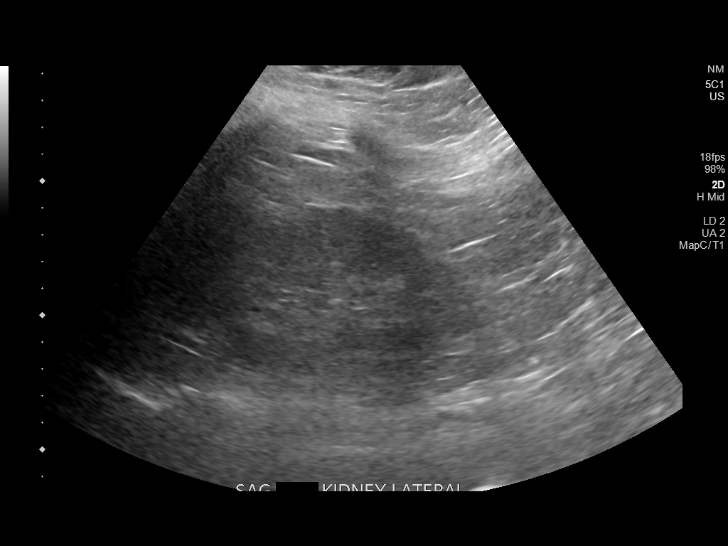
[im 50/67]
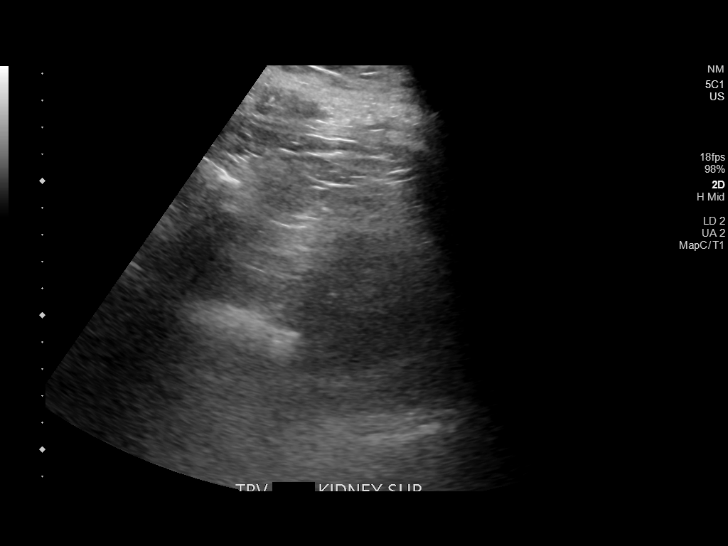
[im 56/67]
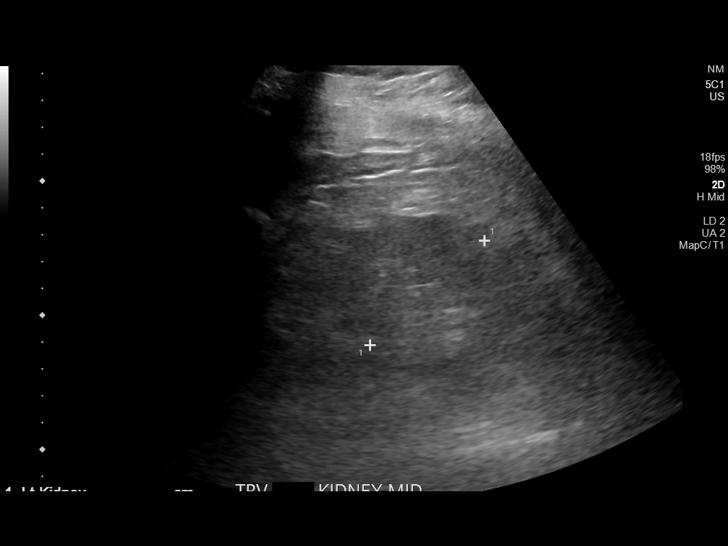
[im 61/67]
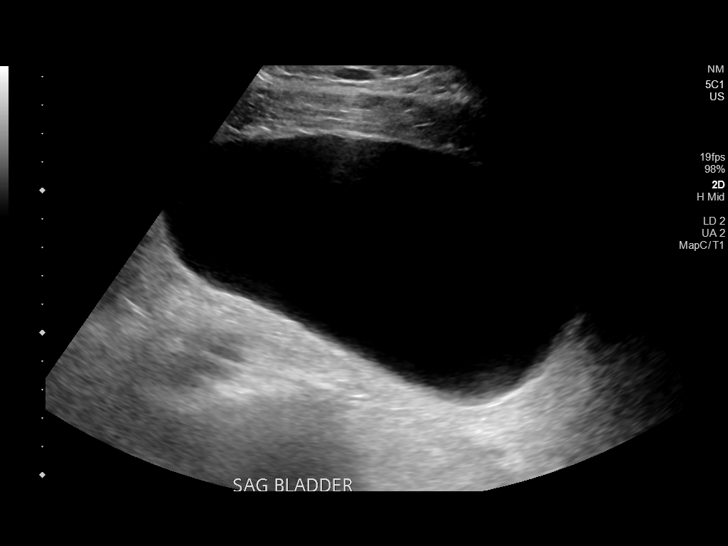
[im 67/67]
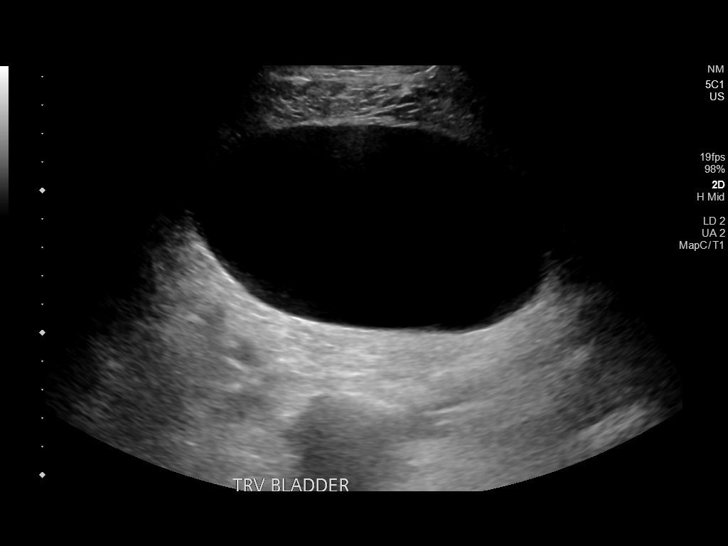

[14 of 25 positions shown; findings below may reference images not displayed]

FINDINGS: Right Kidney:

Renal measurements: 11.7 x 6.0 x 6.7 cm = volume: 244. ML. There is
increased renal parenchymal echogenicity. No hydronephrosis or
shadowing stone.

Left Kidney:

Renal measurements: 11.1 x 6.2 x 5.8 cm = volume: 209 mL. There is
increased renal parenchymal echogenicity. No hydronephrosis or
shadowing stone.

Bladder:

The urinary bladder is distended otherwise unremarkable

Other:

None.
IMPRESSION: Echogenic kidneys in keeping with chronic kidney disease. No
hydronephrosis or shadowing stone.

## 2020-04-29 DIAGNOSIS — E1022 Type 1 diabetes mellitus with diabetic chronic kidney disease: Secondary | ICD-10-CM | POA: Diagnosis not present

## 2020-04-29 DIAGNOSIS — N2581 Secondary hyperparathyroidism of renal origin: Secondary | ICD-10-CM | POA: Diagnosis not present

## 2020-04-29 DIAGNOSIS — I10 Essential (primary) hypertension: Secondary | ICD-10-CM | POA: Diagnosis not present

## 2020-04-29 DIAGNOSIS — D631 Anemia in chronic kidney disease: Secondary | ICD-10-CM | POA: Diagnosis not present

## 2020-04-29 DIAGNOSIS — N184 Chronic kidney disease, stage 4 (severe): Secondary | ICD-10-CM | POA: Diagnosis not present

## 2020-05-06 DIAGNOSIS — E1022 Type 1 diabetes mellitus with diabetic chronic kidney disease: Secondary | ICD-10-CM | POA: Diagnosis not present

## 2020-05-06 DIAGNOSIS — I10 Essential (primary) hypertension: Secondary | ICD-10-CM | POA: Diagnosis not present

## 2020-05-06 DIAGNOSIS — D631 Anemia in chronic kidney disease: Secondary | ICD-10-CM | POA: Diagnosis not present

## 2020-05-06 DIAGNOSIS — N179 Acute kidney failure, unspecified: Secondary | ICD-10-CM | POA: Diagnosis not present

## 2020-05-06 DIAGNOSIS — N184 Chronic kidney disease, stage 4 (severe): Secondary | ICD-10-CM | POA: Diagnosis not present

## 2020-05-20 ENCOUNTER — Other Ambulatory Visit: Payer: Self-pay

## 2020-05-20 ENCOUNTER — Ambulatory Visit: Payer: 59 | Admitting: Podiatry

## 2020-05-20 DIAGNOSIS — E0843 Diabetes mellitus due to underlying condition with diabetic autonomic (poly)neuropathy: Secondary | ICD-10-CM

## 2020-05-20 DIAGNOSIS — L97522 Non-pressure chronic ulcer of other part of left foot with fat layer exposed: Secondary | ICD-10-CM | POA: Diagnosis not present

## 2020-05-20 DIAGNOSIS — E113393 Type 2 diabetes mellitus with moderate nonproliferative diabetic retinopathy without macular edema, bilateral: Secondary | ICD-10-CM | POA: Diagnosis not present

## 2020-05-20 NOTE — Progress Notes (Signed)
   Subjective:  Patient presents today for evaluation of a new ulcer that developed to the patient's left hallux.  He was last seen in the office on 11/23/2019.  Patient states that for the past 8 months he has not had a wound to his left great toe.  He did have exostectomy of the IPJ performed on 11/16/2018.  After the surgery the wound subsequently healed.  He states he has had no issues up until 3 days ago when the wound reoccurred.  He denies recalling any incident that would have caused the wound to develop.  He presents today for further treatment and evaluation  Past Medical History:  Diagnosis Date  . CKD (chronic kidney disease), stage II   . Gastroparesis   . Herniated cervical disc   . HTN (hypertension)   . Hypercholesteremia   . S/P cardiac cath    a. 10-15 yrs ago at [facility] due to tachycardia, reportedly normal. b. Normal ETT 03/2012.  Marland Kitchen Sinus tachycardia    a. 24-hr Holter 03/2012 - SR, occ PVCs, no VT, avg HR 96bpm.  . TIA (transient ischemic attack)   . Type 1 diabetes mellitus ([place])    a. With insulin pump.      Objective/Physical Exam Neurovascular status intact. Maceration noted to the incision site has improved significantly.   Wound #1 noted to the left hallux measuring 1.2 x 1.2 x 0.2 cm.   To the above-noted ulceration, there is no eschar. There is a moderate amount of slough, fibrin and necrotic tissue. Granulation tissue and wound base is red. There is no malodor. There is a minimal amount of serosanginous drainage noted. Periwound integrity is intact.  There is no exposed bone muscle tendon ligament or joint.   Assessment: 1. s/p exostectomy left hallux. DOS: 11/16/2018 2. 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 Patient was evaluated. 2.  Medically necessary excisional debridement including subcutaneous tissue was performed using a tissue nipper.  Excisional debridement of all necrotic nonviable tissue down to the healthy  bleeding viable tissue was performed with post debridement measurement same as pre- 3.  Hydrofera Blue was provided to the patient to apply daily 4.  Cam boot dispensed.  Minimal weightbearing as tolerated 5.  Return to clinic in 3 weeks   Edrick Kins, DPM [facility]  Dr. Edrick Kins, [address]                                        [place], [place] [address]                Office [phone]  Fax [phone]

## 2020-05-21 DIAGNOSIS — R112 Nausea with vomiting, unspecified: Secondary | ICD-10-CM | POA: Diagnosis not present

## 2020-05-21 DIAGNOSIS — I120 Hypertensive chronic kidney disease with stage 5 chronic kidney disease or end stage renal disease: Secondary | ICD-10-CM | POA: Diagnosis not present

## 2020-05-21 DIAGNOSIS — N185 Chronic kidney disease, stage 5: Secondary | ICD-10-CM | POA: Diagnosis not present

## 2020-05-21 DIAGNOSIS — Z6833 Body mass index (BMI) 33.0-33.9, adult: Secondary | ICD-10-CM | POA: Diagnosis not present

## 2020-05-21 DIAGNOSIS — E669 Obesity, unspecified: Secondary | ICD-10-CM | POA: Diagnosis not present

## 2020-05-29 DIAGNOSIS — Z794 Long term (current) use of insulin: Secondary | ICD-10-CM | POA: Diagnosis not present

## 2020-05-29 DIAGNOSIS — E1122 Type 2 diabetes mellitus with diabetic chronic kidney disease: Secondary | ICD-10-CM | POA: Diagnosis not present

## 2020-05-29 DIAGNOSIS — Z6833 Body mass index (BMI) 33.0-33.9, adult: Secondary | ICD-10-CM | POA: Diagnosis not present

## 2020-05-29 DIAGNOSIS — I120 Hypertensive chronic kidney disease with stage 5 chronic kidney disease or end stage renal disease: Secondary | ICD-10-CM | POA: Diagnosis not present

## 2020-05-29 DIAGNOSIS — E669 Obesity, unspecified: Secondary | ICD-10-CM | POA: Diagnosis not present

## 2020-05-29 DIAGNOSIS — Z992 Dependence on renal dialysis: Secondary | ICD-10-CM | POA: Diagnosis not present

## 2020-05-29 DIAGNOSIS — N184 Chronic kidney disease, stage 4 (severe): Secondary | ICD-10-CM | POA: Diagnosis not present

## 2020-05-29 DIAGNOSIS — N186 End stage renal disease: Secondary | ICD-10-CM | POA: Diagnosis not present

## 2020-05-30 DIAGNOSIS — Z01810 Encounter for preprocedural cardiovascular examination: Secondary | ICD-10-CM | POA: Diagnosis not present

## 2020-05-30 DIAGNOSIS — N184 Chronic kidney disease, stage 4 (severe): Secondary | ICD-10-CM | POA: Diagnosis not present

## 2020-06-03 DIAGNOSIS — E113393 Type 2 diabetes mellitus with moderate nonproliferative diabetic retinopathy without macular edema, bilateral: Secondary | ICD-10-CM | POA: Diagnosis not present

## 2020-06-04 DIAGNOSIS — K6389 Other specified diseases of intestine: Secondary | ICD-10-CM | POA: Diagnosis not present

## 2020-06-04 DIAGNOSIS — R110 Nausea: Secondary | ICD-10-CM | POA: Diagnosis not present

## 2020-06-04 DIAGNOSIS — M47816 Spondylosis without myelopathy or radiculopathy, lumbar region: Secondary | ICD-10-CM | POA: Diagnosis not present

## 2020-06-04 DIAGNOSIS — I878 Other specified disorders of veins: Secondary | ICD-10-CM | POA: Diagnosis not present

## 2020-06-06 DIAGNOSIS — Z992 Dependence on renal dialysis: Secondary | ICD-10-CM | POA: Diagnosis not present

## 2020-06-06 DIAGNOSIS — Z794 Long term (current) use of insulin: Secondary | ICD-10-CM | POA: Diagnosis not present

## 2020-06-06 DIAGNOSIS — N186 End stage renal disease: Secondary | ICD-10-CM | POA: Diagnosis not present

## 2020-06-06 DIAGNOSIS — E109 Type 1 diabetes mellitus without complications: Secondary | ICD-10-CM | POA: Diagnosis not present

## 2020-06-06 DIAGNOSIS — Z79899 Other long term (current) drug therapy: Secondary | ICD-10-CM | POA: Diagnosis not present

## 2020-06-06 DIAGNOSIS — E785 Hyperlipidemia, unspecified: Secondary | ICD-10-CM | POA: Diagnosis not present

## 2020-06-06 DIAGNOSIS — Z5181 Encounter for therapeutic drug level monitoring: Secondary | ICD-10-CM | POA: Diagnosis not present

## 2020-06-06 DIAGNOSIS — D509 Iron deficiency anemia, unspecified: Secondary | ICD-10-CM | POA: Diagnosis not present

## 2020-06-10 ENCOUNTER — Other Ambulatory Visit: Payer: Self-pay

## 2020-06-10 ENCOUNTER — Ambulatory Visit (INDEPENDENT_AMBULATORY_CARE_PROVIDER_SITE_OTHER): Payer: 59 | Admitting: Podiatry

## 2020-06-10 DIAGNOSIS — L97522 Non-pressure chronic ulcer of other part of left foot with fat layer exposed: Secondary | ICD-10-CM

## 2020-06-10 DIAGNOSIS — E0843 Diabetes mellitus due to underlying condition with diabetic autonomic (poly)neuropathy: Secondary | ICD-10-CM | POA: Diagnosis not present

## 2020-06-10 NOTE — Progress Notes (Signed)
   Subjective:  Patient presents today for follow-up evaluation of an ulcer to the left hallux.  Patient states that he believes the ulcer has improved.  He has been applying Aquacel to the wound with a light dressing daily.  He feels that the ulcer is doing better and he has no additional complaints or new complaints at the moment.  Past Medical History:  Diagnosis Date  . CKD (chronic kidney disease), stage II   . Gastroparesis   . Herniated cervical disc   . HTN (hypertension)   . Hypercholesteremia   . S/P cardiac cath    a. 10-15 yrs ago at [facility] due to tachycardia, reportedly normal. b. Normal ETT 03/2012.  Marland Kitchen Sinus tachycardia    a. 24-hr Holter 03/2012 - SR, occ PVCs, no VT, avg HR 96bpm.  . TIA (transient ischemic attack)   . Type 1 diabetes mellitus ([place])    a. With insulin pump.      Objective/Physical Exam Neurovascular status intact. Maceration noted to the incision site has improved significantly.   Wound #1 noted to the left hallux measuring 1.0x1.0 x 0.2 cm.   To the above-noted ulceration, there is no eschar. There is a moderate amount of slough, fibrin and necrotic tissue. Granulation tissue and wound base is red. There is no malodor. There is a minimal amount of serosanginous drainage noted. Periwound integrity is intact.  There is no exposed bone muscle tendon ligament or joint.   Assessment: 1. s/p exostectomy left hallux. DOS: 11/16/2018 2. 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 Patient was evaluated. 2.  Medically necessary excisional debridement including subcutaneous tissue was performed using a tissue nipper.  Excisional debridement of all necrotic nonviable tissue down to the healthy bleeding viable tissue was performed with post debridement measurement same as pre- 3.  Aquacel AG was applied to the wound with a light dressing today. 4. Order for wound supplies including Aquacel Ag and Prisma collagen was placed and  faxed to [facility] 5.  Patient is no longer wearing the cam boot.  Recommend good supportive diabetic shoes  6.  Return to clinic in 3 weeks   Edrick Kins, DPM [facility]  Dr. Edrick Kins, [address]                                        [place],  [address]                Office [phone]  Fax [phone]

## 2020-06-11 DIAGNOSIS — Z992 Dependence on renal dialysis: Secondary | ICD-10-CM | POA: Diagnosis not present

## 2020-06-11 DIAGNOSIS — N186 End stage renal disease: Secondary | ICD-10-CM | POA: Diagnosis not present

## 2020-06-12 DIAGNOSIS — Z992 Dependence on renal dialysis: Secondary | ICD-10-CM | POA: Diagnosis not present

## 2020-06-12 DIAGNOSIS — N186 End stage renal disease: Secondary | ICD-10-CM | POA: Diagnosis not present

## 2020-06-16 DIAGNOSIS — Z992 Dependence on renal dialysis: Secondary | ICD-10-CM | POA: Diagnosis not present

## 2020-06-16 DIAGNOSIS — L97522 Non-pressure chronic ulcer of other part of left foot with fat layer exposed: Secondary | ICD-10-CM | POA: Diagnosis not present

## 2020-06-16 DIAGNOSIS — N186 End stage renal disease: Secondary | ICD-10-CM | POA: Diagnosis not present

## 2020-06-18 DIAGNOSIS — Z992 Dependence on renal dialysis: Secondary | ICD-10-CM | POA: Diagnosis not present

## 2020-06-18 DIAGNOSIS — N186 End stage renal disease: Secondary | ICD-10-CM | POA: Diagnosis not present

## 2020-06-19 DIAGNOSIS — Z992 Dependence on renal dialysis: Secondary | ICD-10-CM | POA: Diagnosis not present

## 2020-06-19 DIAGNOSIS — N186 End stage renal disease: Secondary | ICD-10-CM | POA: Diagnosis not present

## 2020-06-20 DIAGNOSIS — Z992 Dependence on renal dialysis: Secondary | ICD-10-CM | POA: Diagnosis not present

## 2020-06-20 DIAGNOSIS — N186 End stage renal disease: Secondary | ICD-10-CM | POA: Diagnosis not present

## 2020-06-23 DIAGNOSIS — Z992 Dependence on renal dialysis: Secondary | ICD-10-CM | POA: Diagnosis not present

## 2020-06-23 DIAGNOSIS — N186 End stage renal disease: Secondary | ICD-10-CM | POA: Diagnosis not present

## 2020-06-26 DIAGNOSIS — N186 End stage renal disease: Secondary | ICD-10-CM | POA: Diagnosis not present

## 2020-06-26 DIAGNOSIS — Z992 Dependence on renal dialysis: Secondary | ICD-10-CM | POA: Diagnosis not present

## 2020-06-27 DIAGNOSIS — Z992 Dependence on renal dialysis: Secondary | ICD-10-CM | POA: Diagnosis not present

## 2020-06-27 DIAGNOSIS — N186 End stage renal disease: Secondary | ICD-10-CM | POA: Diagnosis not present

## 2020-06-30 DIAGNOSIS — E1065 Type 1 diabetes mellitus with hyperglycemia: Secondary | ICD-10-CM | POA: Diagnosis not present

## 2020-07-01 DIAGNOSIS — Z992 Dependence on renal dialysis: Secondary | ICD-10-CM | POA: Diagnosis not present

## 2020-07-01 DIAGNOSIS — N186 End stage renal disease: Secondary | ICD-10-CM | POA: Diagnosis not present

## 2020-07-02 DIAGNOSIS — Z992 Dependence on renal dialysis: Secondary | ICD-10-CM | POA: Diagnosis not present

## 2020-07-02 DIAGNOSIS — Z01818 Encounter for other preprocedural examination: Secondary | ICD-10-CM | POA: Diagnosis not present

## 2020-07-02 DIAGNOSIS — N186 End stage renal disease: Secondary | ICD-10-CM | POA: Diagnosis not present

## 2020-07-02 DIAGNOSIS — Z6835 Body mass index (BMI) 35.0-35.9, adult: Secondary | ICD-10-CM | POA: Diagnosis not present

## 2020-07-02 DIAGNOSIS — R112 Nausea with vomiting, unspecified: Secondary | ICD-10-CM | POA: Diagnosis not present

## 2020-07-02 DIAGNOSIS — N184 Chronic kidney disease, stage 4 (severe): Secondary | ICD-10-CM | POA: Diagnosis not present

## 2020-07-03 DIAGNOSIS — N186 End stage renal disease: Secondary | ICD-10-CM | POA: Diagnosis not present

## 2020-07-03 DIAGNOSIS — Z992 Dependence on renal dialysis: Secondary | ICD-10-CM | POA: Diagnosis not present

## 2020-07-04 DIAGNOSIS — N186 End stage renal disease: Secondary | ICD-10-CM | POA: Diagnosis not present

## 2020-07-04 DIAGNOSIS — Z992 Dependence on renal dialysis: Secondary | ICD-10-CM | POA: Diagnosis not present

## 2020-07-05 DIAGNOSIS — Z992 Dependence on renal dialysis: Secondary | ICD-10-CM | POA: Diagnosis not present

## 2020-07-05 DIAGNOSIS — N186 End stage renal disease: Secondary | ICD-10-CM | POA: Diagnosis not present

## 2020-07-06 DIAGNOSIS — N186 End stage renal disease: Secondary | ICD-10-CM | POA: Diagnosis not present

## 2020-07-06 DIAGNOSIS — Z992 Dependence on renal dialysis: Secondary | ICD-10-CM | POA: Diagnosis not present

## 2020-07-07 DIAGNOSIS — Z992 Dependence on renal dialysis: Secondary | ICD-10-CM | POA: Diagnosis not present

## 2020-07-07 DIAGNOSIS — Z794 Long term (current) use of insulin: Secondary | ICD-10-CM | POA: Diagnosis not present

## 2020-07-07 DIAGNOSIS — E109 Type 1 diabetes mellitus without complications: Secondary | ICD-10-CM | POA: Diagnosis not present

## 2020-07-07 DIAGNOSIS — D509 Iron deficiency anemia, unspecified: Secondary | ICD-10-CM | POA: Diagnosis not present

## 2020-07-07 DIAGNOSIS — N186 End stage renal disease: Secondary | ICD-10-CM | POA: Diagnosis not present

## 2020-07-08 DIAGNOSIS — N186 End stage renal disease: Secondary | ICD-10-CM | POA: Diagnosis not present

## 2020-07-08 DIAGNOSIS — Z992 Dependence on renal dialysis: Secondary | ICD-10-CM | POA: Diagnosis not present

## 2020-07-09 DIAGNOSIS — N186 End stage renal disease: Secondary | ICD-10-CM | POA: Diagnosis not present

## 2020-07-09 DIAGNOSIS — Z992 Dependence on renal dialysis: Secondary | ICD-10-CM | POA: Diagnosis not present

## 2020-07-10 DIAGNOSIS — N186 End stage renal disease: Secondary | ICD-10-CM | POA: Diagnosis not present

## 2020-07-10 DIAGNOSIS — Z992 Dependence on renal dialysis: Secondary | ICD-10-CM | POA: Diagnosis not present

## 2020-07-11 ENCOUNTER — Ambulatory Visit: Payer: 59 | Admitting: Podiatry

## 2020-07-11 ENCOUNTER — Other Ambulatory Visit: Payer: Self-pay

## 2020-07-11 ENCOUNTER — Encounter: Payer: Self-pay | Admitting: Podiatry

## 2020-07-11 DIAGNOSIS — L97522 Non-pressure chronic ulcer of other part of left foot with fat layer exposed: Secondary | ICD-10-CM

## 2020-07-11 DIAGNOSIS — E0843 Diabetes mellitus due to underlying condition with diabetic autonomic (poly)neuropathy: Secondary | ICD-10-CM

## 2020-07-11 DIAGNOSIS — Z992 Dependence on renal dialysis: Secondary | ICD-10-CM | POA: Diagnosis not present

## 2020-07-11 DIAGNOSIS — N186 End stage renal disease: Secondary | ICD-10-CM | POA: Diagnosis not present

## 2020-07-11 NOTE — Progress Notes (Signed)
   Subjective:  Patient presents today for follow-up evaluation of an ulcer to the left hallux.  Patient states that the hallux ulcer has remained stagnant.  His sister-in-law who is a nurse continues to dress it daily with Aquacel Ag.  He has been wearing the cam boot occasionally.  No new complaints at this time  Past Medical History:  Diagnosis Date  . CKD (chronic kidney disease), stage II   . Gastroparesis   . Herniated cervical disc   . HTN (hypertension)   . Hypercholesteremia   . S/P cardiac cath    a. 10-15 yrs ago at [facility] due to tachycardia, reportedly normal. b. Normal ETT 03/2012.  Marland Kitchen Sinus tachycardia    a. 24-hr Holter 03/2012 - SR, occ PVCs, no VT, avg HR 96bpm.  . TIA (transient ischemic attack)   . Type 1 diabetes mellitus ([place])    a. With insulin pump.      Objective/Physical Exam Neurovascular status intact. Maceration noted to the incision site has improved significantly.   Wound #1 noted to the left hallux measuring 1.0x1.0 x 0.2 cm.   To the above-noted ulceration, there is no eschar. There is a moderate amount of slough, fibrin and necrotic tissue. Granulation tissue and wound base is red. There is no malodor. There is a minimal amount of serosanginous drainage noted. Periwound integrity is intact.  There is no exposed bone muscle tendon ligament or joint.   Assessment: 1. s/p exostectomy left hallux. DOS: 11/16/2018 2. 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 Patient was evaluated. 2.  Medically necessary excisional debridement including subcutaneous tissue was performed using a tissue nipper.  Excisional debridement of all necrotic nonviable tissue down to the healthy bleeding viable tissue was performed with post debridement measurement same as pre- 3.  Aquacel AG was applied to the wound with a light dressing today. 4.  Continue Aquacel Ag and Prisma collagen.  Orders were placed through [facility] 5.   The patient states that he lost his last pair of custom molded orthotics.  Message was placed today for our orthotics department to order a new pair of orthotics.  In the meantime wear the cam boot as needed 6.  Return to clinic in 4 weeks   Edrick Kins, DPM [facility]  Dr. Edrick Kins, [address]                                        [place], [place] [address]                Office [phone]  Fax [phone]

## 2020-07-12 DIAGNOSIS — N186 End stage renal disease: Secondary | ICD-10-CM | POA: Diagnosis not present

## 2020-07-12 DIAGNOSIS — Z992 Dependence on renal dialysis: Secondary | ICD-10-CM | POA: Diagnosis not present

## 2020-07-13 DIAGNOSIS — Z992 Dependence on renal dialysis: Secondary | ICD-10-CM | POA: Diagnosis not present

## 2020-07-13 DIAGNOSIS — N186 End stage renal disease: Secondary | ICD-10-CM | POA: Diagnosis not present

## 2020-07-14 DIAGNOSIS — N186 End stage renal disease: Secondary | ICD-10-CM | POA: Diagnosis not present

## 2020-07-14 DIAGNOSIS — Z992 Dependence on renal dialysis: Secondary | ICD-10-CM | POA: Diagnosis not present

## 2020-07-15 DIAGNOSIS — N186 End stage renal disease: Secondary | ICD-10-CM | POA: Diagnosis not present

## 2020-07-15 DIAGNOSIS — Z992 Dependence on renal dialysis: Secondary | ICD-10-CM | POA: Diagnosis not present

## 2020-07-16 DIAGNOSIS — N186 End stage renal disease: Secondary | ICD-10-CM | POA: Diagnosis not present

## 2020-07-16 DIAGNOSIS — Z992 Dependence on renal dialysis: Secondary | ICD-10-CM | POA: Diagnosis not present

## 2020-07-17 DIAGNOSIS — Z992 Dependence on renal dialysis: Secondary | ICD-10-CM | POA: Diagnosis not present

## 2020-07-17 DIAGNOSIS — N186 End stage renal disease: Secondary | ICD-10-CM | POA: Diagnosis not present

## 2020-07-18 DIAGNOSIS — N186 End stage renal disease: Secondary | ICD-10-CM | POA: Diagnosis not present

## 2020-07-18 DIAGNOSIS — Z992 Dependence on renal dialysis: Secondary | ICD-10-CM | POA: Diagnosis not present

## 2020-07-19 DIAGNOSIS — Z992 Dependence on renal dialysis: Secondary | ICD-10-CM | POA: Diagnosis not present

## 2020-07-19 DIAGNOSIS — N186 End stage renal disease: Secondary | ICD-10-CM | POA: Diagnosis not present

## 2020-07-20 DIAGNOSIS — Z992 Dependence on renal dialysis: Secondary | ICD-10-CM | POA: Diagnosis not present

## 2020-07-20 DIAGNOSIS — N186 End stage renal disease: Secondary | ICD-10-CM | POA: Diagnosis not present

## 2020-07-21 DIAGNOSIS — N186 End stage renal disease: Secondary | ICD-10-CM | POA: Diagnosis not present

## 2020-07-21 DIAGNOSIS — Z992 Dependence on renal dialysis: Secondary | ICD-10-CM | POA: Diagnosis not present

## 2020-07-22 DIAGNOSIS — Z992 Dependence on renal dialysis: Secondary | ICD-10-CM | POA: Diagnosis not present

## 2020-07-22 DIAGNOSIS — N186 End stage renal disease: Secondary | ICD-10-CM | POA: Diagnosis not present

## 2020-07-23 DIAGNOSIS — Z992 Dependence on renal dialysis: Secondary | ICD-10-CM | POA: Diagnosis not present

## 2020-07-23 DIAGNOSIS — N186 End stage renal disease: Secondary | ICD-10-CM | POA: Diagnosis not present

## 2020-07-24 DIAGNOSIS — Z992 Dependence on renal dialysis: Secondary | ICD-10-CM | POA: Diagnosis not present

## 2020-07-24 DIAGNOSIS — N186 End stage renal disease: Secondary | ICD-10-CM | POA: Diagnosis not present

## 2020-07-25 DIAGNOSIS — Z23 Encounter for immunization: Secondary | ICD-10-CM | POA: Diagnosis not present

## 2020-07-25 DIAGNOSIS — Z992 Dependence on renal dialysis: Secondary | ICD-10-CM | POA: Diagnosis not present

## 2020-07-25 DIAGNOSIS — N186 End stage renal disease: Secondary | ICD-10-CM | POA: Diagnosis not present

## 2020-07-26 DIAGNOSIS — Z23 Encounter for immunization: Secondary | ICD-10-CM | POA: Diagnosis not present

## 2020-07-26 DIAGNOSIS — N186 End stage renal disease: Secondary | ICD-10-CM | POA: Diagnosis not present

## 2020-07-26 DIAGNOSIS — Z992 Dependence on renal dialysis: Secondary | ICD-10-CM | POA: Diagnosis not present

## 2020-07-27 DIAGNOSIS — Z23 Encounter for immunization: Secondary | ICD-10-CM | POA: Diagnosis not present

## 2020-07-27 DIAGNOSIS — Z992 Dependence on renal dialysis: Secondary | ICD-10-CM | POA: Diagnosis not present

## 2020-07-27 DIAGNOSIS — N186 End stage renal disease: Secondary | ICD-10-CM | POA: Diagnosis not present

## 2020-07-28 DIAGNOSIS — N186 End stage renal disease: Secondary | ICD-10-CM | POA: Diagnosis not present

## 2020-07-28 DIAGNOSIS — Z992 Dependence on renal dialysis: Secondary | ICD-10-CM | POA: Diagnosis not present

## 2020-07-28 DIAGNOSIS — Z23 Encounter for immunization: Secondary | ICD-10-CM | POA: Diagnosis not present

## 2020-07-29 DIAGNOSIS — Z992 Dependence on renal dialysis: Secondary | ICD-10-CM | POA: Diagnosis not present

## 2020-07-29 DIAGNOSIS — N186 End stage renal disease: Secondary | ICD-10-CM | POA: Diagnosis not present

## 2020-07-29 DIAGNOSIS — Z23 Encounter for immunization: Secondary | ICD-10-CM | POA: Diagnosis not present

## 2020-07-30 DIAGNOSIS — Z23 Encounter for immunization: Secondary | ICD-10-CM | POA: Diagnosis not present

## 2020-07-30 DIAGNOSIS — Z992 Dependence on renal dialysis: Secondary | ICD-10-CM | POA: Diagnosis not present

## 2020-07-30 DIAGNOSIS — N186 End stage renal disease: Secondary | ICD-10-CM | POA: Diagnosis not present

## 2020-07-31 DIAGNOSIS — Z992 Dependence on renal dialysis: Secondary | ICD-10-CM | POA: Diagnosis not present

## 2020-07-31 DIAGNOSIS — N186 End stage renal disease: Secondary | ICD-10-CM | POA: Diagnosis not present

## 2020-07-31 DIAGNOSIS — Z23 Encounter for immunization: Secondary | ICD-10-CM | POA: Diagnosis not present

## 2020-08-01 DIAGNOSIS — Z23 Encounter for immunization: Secondary | ICD-10-CM | POA: Diagnosis not present

## 2020-08-01 DIAGNOSIS — N186 End stage renal disease: Secondary | ICD-10-CM | POA: Diagnosis not present

## 2020-08-01 DIAGNOSIS — Z992 Dependence on renal dialysis: Secondary | ICD-10-CM | POA: Diagnosis not present

## 2020-08-02 DIAGNOSIS — Z23 Encounter for immunization: Secondary | ICD-10-CM | POA: Diagnosis not present

## 2020-08-02 DIAGNOSIS — N186 End stage renal disease: Secondary | ICD-10-CM | POA: Diagnosis not present

## 2020-08-02 DIAGNOSIS — Z992 Dependence on renal dialysis: Secondary | ICD-10-CM | POA: Diagnosis not present

## 2020-08-03 DIAGNOSIS — Z23 Encounter for immunization: Secondary | ICD-10-CM | POA: Diagnosis not present

## 2020-08-03 DIAGNOSIS — Z992 Dependence on renal dialysis: Secondary | ICD-10-CM | POA: Diagnosis not present

## 2020-08-03 DIAGNOSIS — N186 End stage renal disease: Secondary | ICD-10-CM | POA: Diagnosis not present

## 2020-08-04 DIAGNOSIS — Z794 Long term (current) use of insulin: Secondary | ICD-10-CM | POA: Diagnosis not present

## 2020-08-04 DIAGNOSIS — E1022 Type 1 diabetes mellitus with diabetic chronic kidney disease: Secondary | ICD-10-CM | POA: Diagnosis not present

## 2020-08-04 DIAGNOSIS — N186 End stage renal disease: Secondary | ICD-10-CM | POA: Diagnosis not present

## 2020-08-04 DIAGNOSIS — Z01818 Encounter for other preprocedural examination: Secondary | ICD-10-CM | POA: Diagnosis not present

## 2020-08-04 DIAGNOSIS — Z992 Dependence on renal dialysis: Secondary | ICD-10-CM | POA: Diagnosis not present

## 2020-08-04 DIAGNOSIS — I120 Hypertensive chronic kidney disease with stage 5 chronic kidney disease or end stage renal disease: Secondary | ICD-10-CM | POA: Diagnosis not present

## 2020-08-05 DIAGNOSIS — Z992 Dependence on renal dialysis: Secondary | ICD-10-CM | POA: Diagnosis not present

## 2020-08-05 DIAGNOSIS — N186 End stage renal disease: Secondary | ICD-10-CM | POA: Diagnosis not present

## 2020-08-06 DIAGNOSIS — N186 End stage renal disease: Secondary | ICD-10-CM | POA: Diagnosis not present

## 2020-08-06 DIAGNOSIS — Z992 Dependence on renal dialysis: Secondary | ICD-10-CM | POA: Diagnosis not present

## 2020-08-07 DIAGNOSIS — Z992 Dependence on renal dialysis: Secondary | ICD-10-CM | POA: Diagnosis not present

## 2020-08-07 DIAGNOSIS — N186 End stage renal disease: Secondary | ICD-10-CM | POA: Diagnosis not present

## 2020-08-08 DIAGNOSIS — D509 Iron deficiency anemia, unspecified: Secondary | ICD-10-CM | POA: Diagnosis not present

## 2020-08-08 DIAGNOSIS — N186 End stage renal disease: Secondary | ICD-10-CM | POA: Diagnosis not present

## 2020-08-08 DIAGNOSIS — Z992 Dependence on renal dialysis: Secondary | ICD-10-CM | POA: Diagnosis not present

## 2020-08-09 DIAGNOSIS — N186 End stage renal disease: Secondary | ICD-10-CM | POA: Diagnosis not present

## 2020-08-09 DIAGNOSIS — Z992 Dependence on renal dialysis: Secondary | ICD-10-CM | POA: Diagnosis not present

## 2020-08-10 DIAGNOSIS — N186 End stage renal disease: Secondary | ICD-10-CM | POA: Diagnosis not present

## 2020-08-10 DIAGNOSIS — Z992 Dependence on renal dialysis: Secondary | ICD-10-CM | POA: Diagnosis not present

## 2020-08-11 DIAGNOSIS — Z992 Dependence on renal dialysis: Secondary | ICD-10-CM | POA: Diagnosis not present

## 2020-08-11 DIAGNOSIS — N186 End stage renal disease: Secondary | ICD-10-CM | POA: Diagnosis not present

## 2020-08-12 ENCOUNTER — Ambulatory Visit: Payer: 59 | Admitting: Podiatry

## 2020-08-12 DIAGNOSIS — N186 End stage renal disease: Secondary | ICD-10-CM | POA: Diagnosis not present

## 2020-08-12 DIAGNOSIS — Z992 Dependence on renal dialysis: Secondary | ICD-10-CM | POA: Diagnosis not present

## 2020-08-13 DIAGNOSIS — Z992 Dependence on renal dialysis: Secondary | ICD-10-CM | POA: Diagnosis not present

## 2020-08-13 DIAGNOSIS — N186 End stage renal disease: Secondary | ICD-10-CM | POA: Diagnosis not present

## 2020-08-14 DIAGNOSIS — Z992 Dependence on renal dialysis: Secondary | ICD-10-CM | POA: Diagnosis not present

## 2020-08-14 DIAGNOSIS — N186 End stage renal disease: Secondary | ICD-10-CM | POA: Diagnosis not present

## 2020-08-15 DIAGNOSIS — Z992 Dependence on renal dialysis: Secondary | ICD-10-CM | POA: Diagnosis not present

## 2020-08-15 DIAGNOSIS — N186 End stage renal disease: Secondary | ICD-10-CM | POA: Diagnosis not present

## 2020-08-16 DIAGNOSIS — Z992 Dependence on renal dialysis: Secondary | ICD-10-CM | POA: Diagnosis not present

## 2020-08-16 DIAGNOSIS — N186 End stage renal disease: Secondary | ICD-10-CM | POA: Diagnosis not present

## 2020-08-17 DIAGNOSIS — N186 End stage renal disease: Secondary | ICD-10-CM | POA: Diagnosis not present

## 2020-08-17 DIAGNOSIS — Z992 Dependence on renal dialysis: Secondary | ICD-10-CM | POA: Diagnosis not present

## 2020-08-18 DIAGNOSIS — N186 End stage renal disease: Secondary | ICD-10-CM | POA: Diagnosis not present

## 2020-08-18 DIAGNOSIS — Z992 Dependence on renal dialysis: Secondary | ICD-10-CM | POA: Diagnosis not present

## 2020-08-19 DIAGNOSIS — Z992 Dependence on renal dialysis: Secondary | ICD-10-CM | POA: Diagnosis not present

## 2020-08-19 DIAGNOSIS — N186 End stage renal disease: Secondary | ICD-10-CM | POA: Diagnosis not present

## 2020-08-20 DIAGNOSIS — N186 End stage renal disease: Secondary | ICD-10-CM | POA: Diagnosis not present

## 2020-08-20 DIAGNOSIS — Z992 Dependence on renal dialysis: Secondary | ICD-10-CM | POA: Diagnosis not present

## 2020-08-21 DIAGNOSIS — Z992 Dependence on renal dialysis: Secondary | ICD-10-CM | POA: Diagnosis not present

## 2020-08-21 DIAGNOSIS — N186 End stage renal disease: Secondary | ICD-10-CM | POA: Diagnosis not present

## 2020-08-22 ENCOUNTER — Other Ambulatory Visit: Payer: Self-pay | Admitting: Nephrology

## 2020-08-22 DIAGNOSIS — N186 End stage renal disease: Secondary | ICD-10-CM | POA: Diagnosis not present

## 2020-08-22 DIAGNOSIS — Z992 Dependence on renal dialysis: Secondary | ICD-10-CM | POA: Diagnosis not present

## 2020-08-23 DIAGNOSIS — N186 End stage renal disease: Secondary | ICD-10-CM | POA: Diagnosis not present

## 2020-08-23 DIAGNOSIS — Z992 Dependence on renal dialysis: Secondary | ICD-10-CM | POA: Diagnosis not present

## 2020-08-24 DIAGNOSIS — Z992 Dependence on renal dialysis: Secondary | ICD-10-CM | POA: Diagnosis not present

## 2020-08-24 DIAGNOSIS — N186 End stage renal disease: Secondary | ICD-10-CM | POA: Diagnosis not present

## 2020-08-25 DIAGNOSIS — Z992 Dependence on renal dialysis: Secondary | ICD-10-CM | POA: Diagnosis not present

## 2020-08-25 DIAGNOSIS — N186 End stage renal disease: Secondary | ICD-10-CM | POA: Diagnosis not present

## 2020-08-26 DIAGNOSIS — N186 End stage renal disease: Secondary | ICD-10-CM | POA: Diagnosis not present

## 2020-08-26 DIAGNOSIS — Z992 Dependence on renal dialysis: Secondary | ICD-10-CM | POA: Diagnosis not present

## 2020-08-27 DIAGNOSIS — Z992 Dependence on renal dialysis: Secondary | ICD-10-CM | POA: Diagnosis not present

## 2020-08-27 DIAGNOSIS — N186 End stage renal disease: Secondary | ICD-10-CM | POA: Diagnosis not present

## 2020-08-28 DIAGNOSIS — Z992 Dependence on renal dialysis: Secondary | ICD-10-CM | POA: Diagnosis not present

## 2020-08-28 DIAGNOSIS — N186 End stage renal disease: Secondary | ICD-10-CM | POA: Diagnosis not present

## 2020-08-29 DIAGNOSIS — N186 End stage renal disease: Secondary | ICD-10-CM | POA: Diagnosis not present

## 2020-08-29 DIAGNOSIS — Z992 Dependence on renal dialysis: Secondary | ICD-10-CM | POA: Diagnosis not present

## 2020-08-30 DIAGNOSIS — Z992 Dependence on renal dialysis: Secondary | ICD-10-CM | POA: Diagnosis not present

## 2020-08-30 DIAGNOSIS — N186 End stage renal disease: Secondary | ICD-10-CM | POA: Diagnosis not present

## 2020-08-31 DIAGNOSIS — N186 End stage renal disease: Secondary | ICD-10-CM | POA: Diagnosis not present

## 2020-08-31 DIAGNOSIS — Z992 Dependence on renal dialysis: Secondary | ICD-10-CM | POA: Diagnosis not present

## 2020-09-01 DIAGNOSIS — E119 Type 2 diabetes mellitus without complications: Secondary | ICD-10-CM | POA: Diagnosis not present

## 2020-09-01 DIAGNOSIS — Z005 Encounter for examination of potential donor of organ and tissue: Secondary | ICD-10-CM | POA: Diagnosis not present

## 2020-09-01 DIAGNOSIS — N186 End stage renal disease: Secondary | ICD-10-CM | POA: Diagnosis not present

## 2020-09-01 DIAGNOSIS — Z90710 Acquired absence of both cervix and uterus: Secondary | ICD-10-CM | POA: Diagnosis not present

## 2020-09-01 DIAGNOSIS — Z992 Dependence on renal dialysis: Secondary | ICD-10-CM | POA: Diagnosis not present

## 2020-09-01 DIAGNOSIS — I878 Other specified disorders of veins: Secondary | ICD-10-CM | POA: Diagnosis not present

## 2020-09-02 DIAGNOSIS — N186 End stage renal disease: Secondary | ICD-10-CM | POA: Diagnosis not present

## 2020-09-02 DIAGNOSIS — Z992 Dependence on renal dialysis: Secondary | ICD-10-CM | POA: Diagnosis not present

## 2020-09-03 DIAGNOSIS — N186 End stage renal disease: Secondary | ICD-10-CM | POA: Diagnosis not present

## 2020-09-03 DIAGNOSIS — Z992 Dependence on renal dialysis: Secondary | ICD-10-CM | POA: Diagnosis not present

## 2020-09-04 DIAGNOSIS — N186 End stage renal disease: Secondary | ICD-10-CM | POA: Diagnosis not present

## 2020-09-04 DIAGNOSIS — Z992 Dependence on renal dialysis: Secondary | ICD-10-CM | POA: Diagnosis not present

## 2020-09-05 ENCOUNTER — Other Ambulatory Visit: Payer: Self-pay

## 2020-09-05 ENCOUNTER — Ambulatory Visit: Payer: 59 | Admitting: Podiatry

## 2020-09-05 ENCOUNTER — Encounter: Payer: Self-pay | Admitting: Podiatry

## 2020-09-05 VITALS — Temp 97.6°F

## 2020-09-05 DIAGNOSIS — E0843 Diabetes mellitus due to underlying condition with diabetic autonomic (poly)neuropathy: Secondary | ICD-10-CM

## 2020-09-05 DIAGNOSIS — N186 End stage renal disease: Secondary | ICD-10-CM | POA: Diagnosis not present

## 2020-09-05 DIAGNOSIS — L97522 Non-pressure chronic ulcer of other part of left foot with fat layer exposed: Secondary | ICD-10-CM

## 2020-09-05 DIAGNOSIS — Z992 Dependence on renal dialysis: Secondary | ICD-10-CM | POA: Diagnosis not present

## 2020-09-05 NOTE — Progress Notes (Signed)
   Subjective:  Patient presents today for follow-up evaluation of an ulcer to the left hallux.  Patient has noticed some improvement with the wound.  He states that he has not been applying any dressing to the toe.  He just recently changed out his diabetic insoles.  Past Medical History:  Diagnosis Date  . CKD (chronic kidney disease), stage II   . Gastroparesis   . Herniated cervical disc   . HTN (hypertension)   . Hypercholesteremia   . S/P cardiac cath    a. 10-15 yrs ago at [facility] due to tachycardia, reportedly normal. b. Normal ETT 03/2012.  Marland Kitchen Sinus tachycardia    a. 24-hr Holter 03/2012 - SR, occ PVCs, no VT, avg HR 96bpm.  . TIA (transient ischemic attack)   . Type 1 diabetes mellitus ([place])    a. With insulin pump.      Objective/Physical Exam Neurovascular status intact. Maceration noted to the incision site has improved significantly.   Wound #1 noted to the left hallux measuring 0.6 x 0.4 x 0.2 cm.   To the above-noted ulceration, there is no eschar. There is a moderate amount of slough, fibrin and necrotic tissue. Granulation tissue and wound base is red. There is no malodor. There is a minimal amount of serosanginous drainage noted. Periwound integrity is intact.  There is no exposed bone muscle tendon ligament or joint.   Assessment: 1. s/p exostectomy left hallux. DOS: 11/16/2018 2. 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 Patient was evaluated. 2.  Medically necessary excisional debridement including subcutaneous tissue was performed using a tissue nipper.  Excisional debridement of all necrotic nonviable tissue down to the healthy bleeding viable tissue was performed with post debridement measurement same as pre- 3.  Puracol collagen dressing was applied to the wound with a light dressing today. 4.  Collagen provided.  Apply daily  5.  Continue diabetic shoes and insoles  6.  Return to clinic in 3 weeks   Edrick Kins,  DPM [facility]  Dr. Edrick Kins, DPM    [address], [place] [address]                Office [phone]  Fax [phone]

## 2020-09-06 DIAGNOSIS — Z992 Dependence on renal dialysis: Secondary | ICD-10-CM | POA: Diagnosis not present

## 2020-09-06 DIAGNOSIS — N186 End stage renal disease: Secondary | ICD-10-CM | POA: Diagnosis not present

## 2020-09-07 DIAGNOSIS — Z992 Dependence on renal dialysis: Secondary | ICD-10-CM | POA: Diagnosis not present

## 2020-09-07 DIAGNOSIS — N186 End stage renal disease: Secondary | ICD-10-CM | POA: Diagnosis not present

## 2020-09-08 DIAGNOSIS — N186 End stage renal disease: Secondary | ICD-10-CM | POA: Diagnosis not present

## 2020-09-08 DIAGNOSIS — Z992 Dependence on renal dialysis: Secondary | ICD-10-CM | POA: Diagnosis not present

## 2020-09-09 DIAGNOSIS — N186 End stage renal disease: Secondary | ICD-10-CM | POA: Diagnosis not present

## 2020-09-09 DIAGNOSIS — Z992 Dependence on renal dialysis: Secondary | ICD-10-CM | POA: Diagnosis not present

## 2020-09-10 DIAGNOSIS — N186 End stage renal disease: Secondary | ICD-10-CM | POA: Diagnosis not present

## 2020-09-10 DIAGNOSIS — Z992 Dependence on renal dialysis: Secondary | ICD-10-CM | POA: Diagnosis not present

## 2020-09-12 ENCOUNTER — Other Ambulatory Visit: Payer: Self-pay | Admitting: Podiatry

## 2020-09-12 ENCOUNTER — Encounter: Payer: Self-pay | Admitting: Podiatry

## 2020-09-12 ENCOUNTER — Other Ambulatory Visit: Payer: Self-pay

## 2020-09-12 ENCOUNTER — Ambulatory Visit (INDEPENDENT_AMBULATORY_CARE_PROVIDER_SITE_OTHER): Payer: 59 | Admitting: Podiatry

## 2020-09-12 VITALS — Temp 97.7°F

## 2020-09-12 DIAGNOSIS — E0843 Diabetes mellitus due to underlying condition with diabetic autonomic (poly)neuropathy: Secondary | ICD-10-CM | POA: Diagnosis not present

## 2020-09-12 DIAGNOSIS — Z992 Dependence on renal dialysis: Secondary | ICD-10-CM | POA: Diagnosis not present

## 2020-09-12 DIAGNOSIS — L97522 Non-pressure chronic ulcer of other part of left foot with fat layer exposed: Secondary | ICD-10-CM

## 2020-09-12 DIAGNOSIS — N186 End stage renal disease: Secondary | ICD-10-CM | POA: Diagnosis not present

## 2020-09-12 MED ORDER — DOXYCYCLINE HYCLATE 100 MG PO TABS: 100.0000 mg | ORAL_TABLET | Freq: Two times a day (BID) | ORAL | 0 refills | Status: DC

## 2020-09-13 DIAGNOSIS — N186 End stage renal disease: Secondary | ICD-10-CM | POA: Diagnosis not present

## 2020-09-13 DIAGNOSIS — Z992 Dependence on renal dialysis: Secondary | ICD-10-CM | POA: Diagnosis not present

## 2020-09-14 DIAGNOSIS — Z992 Dependence on renal dialysis: Secondary | ICD-10-CM | POA: Diagnosis not present

## 2020-09-14 DIAGNOSIS — N186 End stage renal disease: Secondary | ICD-10-CM | POA: Diagnosis not present

## 2020-09-15 DIAGNOSIS — Z992 Dependence on renal dialysis: Secondary | ICD-10-CM | POA: Diagnosis not present

## 2020-09-15 DIAGNOSIS — N186 End stage renal disease: Secondary | ICD-10-CM | POA: Diagnosis not present

## 2020-09-16 DIAGNOSIS — Z992 Dependence on renal dialysis: Secondary | ICD-10-CM | POA: Diagnosis not present

## 2020-09-16 DIAGNOSIS — N186 End stage renal disease: Secondary | ICD-10-CM | POA: Diagnosis not present

## 2020-09-16 NOTE — Progress Notes (Signed)
   Subjective:  Patient presents today for follow-up evaluation of an ulcer to the left hallux.  Patient states that the toe was doing very well up until a couple of days ago.  Few days ago he noticed increased redness and drainage to the toe.  He presents for further treatment and evaluation  Past Medical History:  Diagnosis Date  . CKD (chronic kidney disease), stage II   . Gastroparesis   . Herniated cervical disc   . HTN (hypertension)   . Hypercholesteremia   . S/P cardiac cath    a. 10-15 yrs ago at [facility] due to tachycardia, reportedly normal. b. Normal ETT 03/2012.  Marland Kitchen Sinus tachycardia    a. 24-hr Holter 03/2012 - SR, occ PVCs, no VT, avg HR 96bpm.  . TIA (transient ischemic attack)   . Type 1 diabetes mellitus ([place])    a. With insulin pump.      Objective/Physical Exam Neurovascular status intact. Maceration noted to the incision site has improved significantly.   Wound #1 noted to the left hallux measuring 0.6 x 0.4 x 0.2 cm.   To the above-noted ulceration, there is no eschar. There is a moderate amount of slough, fibrin and necrotic tissue. Granulation tissue and wound base is red. There is no malodor.  There is some increased erythema and edema around the ulceration site with increased drainage.  Periwound is slightly macerated.  There is no exposed bone muscle tendon ligament or joint.   Assessment: 1. s/p exostectomy left hallux. DOS: 11/16/2018 2. 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 Patient was evaluated. 2.  Medically necessary excisional debridement including subcutaneous tissue was performed using a tissue nipper.  Excisional debridement of all necrotic nonviable tissue down to the healthy bleeding viable tissue was performed with post debridement measurement same as pre- 3.  Cultures taken and sent to pathology for culture and sensitivity 4.  Prescription for doxycycline 100 mg #20 5.  Continue wearing diabetic shoes 6.   Betadine ointment applied.  Recommend Betadine ointment daily 7.  Return to clinic in 10 days  Edrick Kins, DPM [facility]  Dr. Edrick Kins, DPM    [address], [place] [address]                Office [phone]  Fax [phone]

## 2020-09-17 DIAGNOSIS — Z992 Dependence on renal dialysis: Secondary | ICD-10-CM | POA: Diagnosis not present

## 2020-09-17 DIAGNOSIS — N186 End stage renal disease: Secondary | ICD-10-CM | POA: Diagnosis not present

## 2020-09-18 DIAGNOSIS — N186 End stage renal disease: Secondary | ICD-10-CM | POA: Diagnosis not present

## 2020-09-18 DIAGNOSIS — Z992 Dependence on renal dialysis: Secondary | ICD-10-CM | POA: Diagnosis not present

## 2020-09-18 DIAGNOSIS — D509 Iron deficiency anemia, unspecified: Secondary | ICD-10-CM | POA: Diagnosis not present

## 2020-09-19 DIAGNOSIS — N186 End stage renal disease: Secondary | ICD-10-CM | POA: Diagnosis not present

## 2020-09-19 DIAGNOSIS — E1065 Type 1 diabetes mellitus with hyperglycemia: Secondary | ICD-10-CM | POA: Diagnosis not present

## 2020-09-19 DIAGNOSIS — Z992 Dependence on renal dialysis: Secondary | ICD-10-CM | POA: Diagnosis not present

## 2020-09-19 LAB — WOUND CULTURE: Organism ID, Bacteria: NONE SEEN

## 2020-09-20 DIAGNOSIS — N186 End stage renal disease: Secondary | ICD-10-CM | POA: Diagnosis not present

## 2020-09-20 DIAGNOSIS — Z992 Dependence on renal dialysis: Secondary | ICD-10-CM | POA: Diagnosis not present

## 2020-09-21 DIAGNOSIS — Z992 Dependence on renal dialysis: Secondary | ICD-10-CM | POA: Diagnosis not present

## 2020-09-21 DIAGNOSIS — N186 End stage renal disease: Secondary | ICD-10-CM | POA: Diagnosis not present

## 2020-09-22 DIAGNOSIS — N186 End stage renal disease: Secondary | ICD-10-CM | POA: Diagnosis not present

## 2020-09-22 DIAGNOSIS — Z992 Dependence on renal dialysis: Secondary | ICD-10-CM | POA: Diagnosis not present

## 2020-09-23 ENCOUNTER — Ambulatory Visit: Payer: 59 | Admitting: Podiatry

## 2020-09-23 ENCOUNTER — Other Ambulatory Visit: Payer: Self-pay

## 2020-09-23 DIAGNOSIS — L97522 Non-pressure chronic ulcer of other part of left foot with fat layer exposed: Secondary | ICD-10-CM

## 2020-09-23 DIAGNOSIS — E0843 Diabetes mellitus due to underlying condition with diabetic autonomic (poly)neuropathy: Secondary | ICD-10-CM

## 2020-09-23 DIAGNOSIS — Z992 Dependence on renal dialysis: Secondary | ICD-10-CM | POA: Diagnosis not present

## 2020-09-23 DIAGNOSIS — N186 End stage renal disease: Secondary | ICD-10-CM | POA: Diagnosis not present

## 2020-09-23 NOTE — Progress Notes (Signed)
   Subjective:  Patient presents today for follow-up evaluation of an ulcer to the left hallux.  Patient states that the redness and swelling has improved significantly.  He is doing very well with no new complaints at this time  Past Medical History:  Diagnosis Date  . CKD (chronic kidney disease), stage II   . Gastroparesis   . Herniated cervical disc   . HTN (hypertension)   . Hypercholesteremia   . S/P cardiac cath    a. 10-15 yrs ago at [facility] due to tachycardia, reportedly normal. b. Normal ETT 03/2012.  Marland Kitchen Sinus tachycardia    a. 24-hr Holter 03/2012 - SR, occ PVCs, no VT, avg HR 96bpm.  . TIA (transient ischemic attack)   . Type 1 diabetes mellitus ([place])    a. With insulin pump.      Objective/Physical Exam Neurovascular status intact. Maceration noted to the incision site has improved significantly.   Wound #1 noted to the left hallux measuring 0.3x0.3 x 0.2 cm.   To the above-noted ulceration, there is no eschar. There is a moderate amount of slough, fibrin and necrotic tissue. Granulation tissue and wound base is red. There is no malodor.  There is some increased erythema and edema around the ulceration site with increased drainage.  Periwound is slightly macerated.  There is no exposed bone muscle tendon ligament or joint.   Assessment: 1. s/p exostectomy left hallux. DOS: 11/16/2018 2. 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 Patient was evaluated. 2.  Medically necessary excisional debridement including subcutaneous tissue was performed using a tissue nipper.  Excisional debridement of all necrotic nonviable tissue down to the healthy bleeding viable tissue was performed with post debridement measurement same as pre- 3.  Cultures reviewed today  4.  Finish oral doxycycline as prescribed  5.  Continue wearing diabetic shoes 6.  Collagen wound matrix was provided for the patient today.  Apply daily with a light dressing  7.  Return  to clinic 3 weeks  Edrick Kins, DPM [facility]  Dr. Edrick Kins, DPM    [address], [place] [address]                Office [phone]  Fax [phone]

## 2020-09-24 DIAGNOSIS — N186 End stage renal disease: Secondary | ICD-10-CM | POA: Diagnosis not present

## 2020-09-24 DIAGNOSIS — Z992 Dependence on renal dialysis: Secondary | ICD-10-CM | POA: Diagnosis not present

## 2020-09-25 DIAGNOSIS — N186 End stage renal disease: Secondary | ICD-10-CM | POA: Diagnosis not present

## 2020-09-25 DIAGNOSIS — Z992 Dependence on renal dialysis: Secondary | ICD-10-CM | POA: Diagnosis not present

## 2020-09-26 DIAGNOSIS — N186 End stage renal disease: Secondary | ICD-10-CM | POA: Diagnosis not present

## 2020-09-26 DIAGNOSIS — Z992 Dependence on renal dialysis: Secondary | ICD-10-CM | POA: Diagnosis not present

## 2020-09-27 DIAGNOSIS — Z992 Dependence on renal dialysis: Secondary | ICD-10-CM | POA: Diagnosis not present

## 2020-09-27 DIAGNOSIS — N186 End stage renal disease: Secondary | ICD-10-CM | POA: Diagnosis not present

## 2020-09-28 DIAGNOSIS — Z20828 Contact with and (suspected) exposure to other viral communicable diseases: Secondary | ICD-10-CM | POA: Diagnosis not present

## 2020-09-28 DIAGNOSIS — E1122 Type 2 diabetes mellitus with diabetic chronic kidney disease: Secondary | ICD-10-CM | POA: Diagnosis not present

## 2020-09-28 DIAGNOSIS — Z992 Dependence on renal dialysis: Secondary | ICD-10-CM | POA: Diagnosis not present

## 2020-09-28 DIAGNOSIS — N186 End stage renal disease: Secondary | ICD-10-CM | POA: Diagnosis not present

## 2020-09-29 ENCOUNTER — Other Ambulatory Visit: Payer: Self-pay | Admitting: Physician Assistant

## 2020-09-29 ENCOUNTER — Encounter: Payer: Self-pay | Admitting: Physician Assistant

## 2020-09-29 DIAGNOSIS — I10 Essential (primary) hypertension: Secondary | ICD-10-CM

## 2020-09-29 DIAGNOSIS — N186 End stage renal disease: Secondary | ICD-10-CM

## 2020-09-29 DIAGNOSIS — Z992 Dependence on renal dialysis: Secondary | ICD-10-CM | POA: Diagnosis not present

## 2020-09-29 DIAGNOSIS — E1065 Type 1 diabetes mellitus with hyperglycemia: Secondary | ICD-10-CM

## 2020-09-29 DIAGNOSIS — Z8673 Personal history of transient ischemic attack (TIA), and cerebral infarction without residual deficits: Secondary | ICD-10-CM

## 2020-09-29 DIAGNOSIS — U071 COVID-19: Secondary | ICD-10-CM

## 2020-09-29 NOTE — Progress Notes (Signed)
I connected by phone with [name] on 09/29/2020 at 9:49 AM to discuss the potential use of a new treatment for mild to moderate COVID-19 viral infection in non-hospitalized patients.  This patient is a 49 y.o. male that meets the FDA criteria for Emergency Use Authorization of COVID monoclonal antibody sotrovimab, casirivimab/imdevimab or bamlamivimab/estevimab.  Has a (+) direct SARS-CoV-2 viral test result  Has mild or moderate COVID-19   Is NOT hospitalized due to COVID-19  Is within 10 days of symptom onset  Has at least one of the high risk factor(s) for progression to severe COVID-19 and/or hospitalization as defined in EUA.  Specific high risk criteria : BMI > 25, Chronic Kidney Disease (CKD), Diabetes and Cardiovascular disease or hypertension   I have spoken and communicated the following to the patient or parent/caregiver regarding COVID monoclonal antibody treatment:  1. FDA has authorized the emergency use for the treatment of mild to moderate COVID-19 in adults and pediatric patients with positive results of direct SARS-CoV-2 viral testing who are 66 years of age and older weighing at least 40 kg, and who are at high risk for progressing to severe COVID-19 and/or hospitalization.  2. The significant known and potential risks and benefits of COVID monoclonal antibody, and the extent to which such potential risks and benefits are unknown.  3. Information on available alternative treatments and the risks and benefits of those alternatives, including clinical trials.  4. Patients treated with COVID monoclonal antibody should continue to self-isolate and use infection control measures (e.g., wear mask, isolate, social distance, avoid sharing personal items, clean and disinfect "high touch" surfaces, and frequent handwashing) according to CDC guidelines.   5. The patient or parent/caregiver has the option to accept or refuse COVID monoclonal antibody treatment.  After  reviewing this information with the patient, the patient has agreed to receive one of the available covid 19 monoclonal antibodies and will be provided an appropriate fact sheet prior to infusion.  Sx onset 12/25. Set up for infusion on 12/28 @ 10:30am. Directions given to [facility]. Pt is aware that insurance will be charged an infusion fee. Pt is fully vaccinated but not boosted. He is a Adult nurse. He was tested at [facility] and will bring in a copy of his positive test.   Angelena Form 09/29/2020 9:49 AM

## 2020-09-30 ENCOUNTER — Other Ambulatory Visit: Payer: Self-pay | Admitting: Nurse Practitioner

## 2020-09-30 ENCOUNTER — Ambulatory Visit: Payer: 59 | Admitting: Podiatry

## 2020-09-30 ENCOUNTER — Ambulatory Visit (HOSPITAL_COMMUNITY)
Admission: RE | Admit: 2020-09-30 | Discharge: 2020-09-30 | Disposition: A | Discharge status: Home / Self Care | Payer: 59 | Source: Ambulatory Visit | Attending: Pulmonary Disease | Admitting: Pulmonary Disease

## 2020-09-30 DIAGNOSIS — U071 COVID-19: Secondary | ICD-10-CM

## 2020-09-30 DIAGNOSIS — E1065 Type 1 diabetes mellitus with hyperglycemia: Secondary | ICD-10-CM | POA: Diagnosis not present

## 2020-09-30 DIAGNOSIS — N186 End stage renal disease: Secondary | ICD-10-CM | POA: Insufficient documentation

## 2020-09-30 DIAGNOSIS — R112 Nausea with vomiting, unspecified: Secondary | ICD-10-CM

## 2020-09-30 DIAGNOSIS — Z8673 Personal history of transient ischemic attack (TIA), and cerebral infarction without residual deficits: Secondary | ICD-10-CM | POA: Diagnosis not present

## 2020-09-30 DIAGNOSIS — Z992 Dependence on renal dialysis: Secondary | ICD-10-CM | POA: Diagnosis not present

## 2020-09-30 DIAGNOSIS — I10 Essential (primary) hypertension: Secondary | ICD-10-CM | POA: Insufficient documentation

## 2020-09-30 MED ORDER — DIPHENHYDRAMINE HCL 50 MG/ML IJ SOLN: 50.0000 mg | Freq: Once | INTRAMUSCULAR | Status: DC | PRN

## 2020-09-30 MED ORDER — SODIUM CHLORIDE 0.9 % IV SOLN: INTRAVENOUS | Status: DC | PRN

## 2020-09-30 MED ORDER — SODIUM CHLORIDE 0.9 % IV SOLN
250.0000 mL | Freq: Once | INTRAVENOUS | Status: AC
  Administered 2020-09-30: 250 mL via INTRAVENOUS

## 2020-09-30 MED ORDER — PROMETHAZINE HCL 25 MG PO TABS: ORAL_TABLET | ORAL | 0 refills | Status: DC

## 2020-09-30 MED ORDER — CLONIDINE HCL 0.1 MG PO TABS
0.1000 mg | ORAL_TABLET | Freq: Once | ORAL | Status: AC
  Administered 2020-09-30: 0.1 mg via ORAL
  Filled 2020-09-30: qty 1

## 2020-09-30 MED ORDER — METHYLPREDNISOLONE SODIUM SUCC 125 MG IJ SOLR: 125.0000 mg | Freq: Once | INTRAMUSCULAR | Status: DC | PRN

## 2020-09-30 MED ORDER — EPINEPHRINE 0.3 MG/0.3ML IJ SOAJ: 0.3000 mg | Freq: Once | INTRAMUSCULAR | Status: DC | PRN

## 2020-09-30 MED ORDER — ALBUTEROL SULFATE HFA 108 (90 BASE) MCG/ACT IN AERS: 2.0000 | INHALATION_SPRAY | Freq: Once | RESPIRATORY_TRACT | Status: DC | PRN

## 2020-09-30 MED ORDER — SODIUM CHLORIDE 0.9 % IV SOLN: Freq: Once | INTRAVENOUS | Status: AC

## 2020-09-30 MED ORDER — FAMOTIDINE IN NACL 20-0.9 MG/50ML-% IV SOLN: 20.0000 mg | Freq: Once | INTRAVENOUS | Status: DC | PRN

## 2020-09-30 MED ORDER — ONDANSETRON HCL 4 MG/2ML IJ SOLN
4.0000 mg | Freq: Once | INTRAMUSCULAR | Status: AC
  Administered 2020-09-30: 4 mg via INTRAVENOUS
  Filled 2020-09-30: qty 2

## 2020-09-30 MED FILL — PROMETHAZINE 25 MG TABLET: 25 | 7 days supply | Qty: 30 | Fill #0

## 2020-09-30 NOTE — Progress Notes (Signed)
On call provider notified by [facility] mAb infusion clinic RN that patient is present for infusion and experiencing elevated blood pressure readings and severe nausea and vomiting.  RN reports patient is a peritoneal dialysis patient at home who has been experiencing nausea and vomiting symptoms with COVID-19 for the past several days. He reportedly has missed his blood pressure medication for 3 days due to inability to keep the medication down. He has been using Zofran at home to help with symptoms, but this has not been effective. All other vitals stable with no signs of chest pain, palpitations, headache, or respiratory distress.   Recommend  clonidine 0.1mg  in the clinic now- recheck BP in 30 minutes.  250mg  NS bolus for dehydration- monitor for fluid volume status and breathing closely May give 12.5mg  IV promethazine if nausea and vomiting are not well controlled with PRN ondansetron in the clinic.  Return call if new symptoms develop or if BP remains above 170/90   Home Management Prescription for promethazine 12.5-25mg  PRN every 6 hours as needed for nausea and vomiting not controlled with ondansetron sent to patients pharmacy.  Encourage patient to take BP medication as soon as he gets home and monitor BP closely. Goal is less than 160/80. Notify PCP of elevated BP readings at home.

## 2020-09-30 NOTE — Progress Notes (Signed)
Patient reviewed Fact Sheet for Patients, Parents, and Caregivers for Emergency Use Authorization (EUA) of Casi/Regen for the Treatment of Coronavirus. Patient also reviewed and is agreeable to the estimated cost of treatment. Patient is agreeable to proceed.    

## 2020-09-30 NOTE — Progress Notes (Signed)
  Diagnosis: COVID-19  Physician: Dr. Joya Gaskins   Procedure: Covid Infusion Clinic Med: casirivimab\imdevimab infusion - Provided patient with casirivimab\imdevimab fact sheet for patients, parents and caregivers prior to infusion.  Complications: No immediate complications noted.  Discharge: Discharged home   [name] 09/30/2020

## 2020-09-30 NOTE — Discharge Instructions (Signed)
10 Things You Can Do to Manage Your COVID-19 Symptoms at Home If you have possible or confirmed COVID-19: 1. Stay home from work and school. And stay away from other public places. If you must go out, avoid using any kind of public transportation, ridesharing, or taxis. 2. Monitor your symptoms carefully. If your symptoms get worse, call your healthcare provider immediately. 3. Get rest and stay hydrated. 4. If you have a medical appointment, call the healthcare provider ahead of time and tell them that you have or may have COVID-19. 5. For medical emergencies, call 911 and notify the dispatch personnel that you have or may have COVID-19. 6. Cover your cough and sneezes with a tissue or use the inside of your elbow. 7. Wash your hands often with soap and water for at least 20 seconds or clean your hands with an alcohol-based hand sanitizer that contains at least 60% alcohol. 8. As much as possible, stay in a specific room and away from other people in your home. Also, you should use a separate bathroom, if available. If you need to be around other people in or outside of the home, wear a mask. 9. Avoid sharing personal items with other people in your household, like dishes, towels, and bedding. 10. Clean all surfaces that are touched often, like counters, tabletops, and doorknobs. Use household cleaning sprays or wipes according to the label instructions. cdc.gov/coronavirus 04/04/2019 This information is not intended to replace advice given to you by your health care provider. Make sure you discuss any questions you have with your health care provider. Document Revised: 09/06/2019 Document Reviewed: 09/06/2019 Elsevier Patient Education  2020 Elsevier Inc. What types of side effects do monoclonal antibody drugs cause?  Common side effects  In general, the more common side effects caused by monoclonal antibody drugs include: . Allergic reactions, such as hives or itching . Flu-like signs and  symptoms, including chills, fatigue, fever, and muscle aches and pains . Nausea, vomiting . Diarrhea . Skin rashes . Low blood pressure   The CDC is recommending patients who receive monoclonal antibody treatments wait at least 90 days before being vaccinated.  Currently, there are no data on the safety and efficacy of mRNA COVID-19 vaccines in persons who received monoclonal antibodies or convalescent plasma as part of COVID-19 treatment. Based on the estimated half-life of such therapies as well as evidence suggesting that reinfection is uncommon in the 90 days after initial infection, vaccination should be deferred for at least 90 days, as a precautionary measure until additional information becomes available, to avoid interference of the antibody treatment with vaccine-induced immune responses. If you have any questions or concerns after the infusion please call the Advanced Practice Provider on call at [phone]. This number is ONLY intended for your use regarding questions or concerns about the infusion post-treatment side-effects.  Please do not provide this number to others for use. For return to work notes please contact your primary care provider.   If someone you know is interested in receiving treatment please have them call the COVID hotline at [phone].   

## 2020-09-30 NOTE — Progress Notes (Addendum)
Patient arrived to infusion center with elevated BP 220/121. All other vitals with in normal range. The patient reports vomiting and having nausea for the past 3 days and unable to keep BP meds and Zofran down. Patient is alert and oriented denies pain, states he maybe a little dehydrated. Patient is currently asymptomatic and does not wish to go to the ED. NP Clarise Cruz notified and orders were given. As well as prescription medication for nausea and vomiting. Patient was given education on follow up with primary doctor as well as monitoring symptoms at home.

## 2020-10-01 DIAGNOSIS — N186 End stage renal disease: Secondary | ICD-10-CM | POA: Diagnosis not present

## 2020-10-01 DIAGNOSIS — Z992 Dependence on renal dialysis: Secondary | ICD-10-CM | POA: Diagnosis not present

## 2020-10-02 DIAGNOSIS — Z992 Dependence on renal dialysis: Secondary | ICD-10-CM | POA: Diagnosis not present

## 2020-10-02 DIAGNOSIS — N186 End stage renal disease: Secondary | ICD-10-CM | POA: Diagnosis not present

## 2020-10-03 DIAGNOSIS — N186 End stage renal disease: Secondary | ICD-10-CM | POA: Diagnosis not present

## 2020-10-03 DIAGNOSIS — Z992 Dependence on renal dialysis: Secondary | ICD-10-CM | POA: Diagnosis not present

## 2020-10-04 DIAGNOSIS — Z992 Dependence on renal dialysis: Secondary | ICD-10-CM | POA: Diagnosis not present

## 2020-10-04 DIAGNOSIS — N186 End stage renal disease: Secondary | ICD-10-CM | POA: Diagnosis not present

## 2020-10-05 DIAGNOSIS — N186 End stage renal disease: Secondary | ICD-10-CM | POA: Diagnosis not present

## 2020-10-05 DIAGNOSIS — Z992 Dependence on renal dialysis: Secondary | ICD-10-CM | POA: Diagnosis not present

## 2020-10-06 DIAGNOSIS — N186 End stage renal disease: Secondary | ICD-10-CM | POA: Diagnosis not present

## 2020-10-06 DIAGNOSIS — Z992 Dependence on renal dialysis: Secondary | ICD-10-CM | POA: Diagnosis not present

## 2020-10-07 DIAGNOSIS — N186 End stage renal disease: Secondary | ICD-10-CM | POA: Diagnosis not present

## 2020-10-07 DIAGNOSIS — Z992 Dependence on renal dialysis: Secondary | ICD-10-CM | POA: Diagnosis not present

## 2020-10-08 DIAGNOSIS — N186 End stage renal disease: Secondary | ICD-10-CM | POA: Diagnosis not present

## 2020-10-08 DIAGNOSIS — Z992 Dependence on renal dialysis: Secondary | ICD-10-CM | POA: Diagnosis not present

## 2020-10-09 DIAGNOSIS — Z992 Dependence on renal dialysis: Secondary | ICD-10-CM | POA: Diagnosis not present

## 2020-10-09 DIAGNOSIS — N186 End stage renal disease: Secondary | ICD-10-CM | POA: Diagnosis not present

## 2020-10-10 DIAGNOSIS — N186 End stage renal disease: Secondary | ICD-10-CM | POA: Diagnosis not present

## 2020-10-10 DIAGNOSIS — Z992 Dependence on renal dialysis: Secondary | ICD-10-CM | POA: Diagnosis not present

## 2020-10-11 DIAGNOSIS — Z992 Dependence on renal dialysis: Secondary | ICD-10-CM | POA: Diagnosis not present

## 2020-10-11 DIAGNOSIS — N186 End stage renal disease: Secondary | ICD-10-CM | POA: Diagnosis not present

## 2020-10-12 DIAGNOSIS — N186 End stage renal disease: Secondary | ICD-10-CM | POA: Diagnosis not present

## 2020-10-12 DIAGNOSIS — Z992 Dependence on renal dialysis: Secondary | ICD-10-CM | POA: Diagnosis not present

## 2020-10-13 DIAGNOSIS — N186 End stage renal disease: Secondary | ICD-10-CM | POA: Diagnosis not present

## 2020-10-13 DIAGNOSIS — Z992 Dependence on renal dialysis: Secondary | ICD-10-CM | POA: Diagnosis not present

## 2020-10-14 ENCOUNTER — Ambulatory Visit: Payer: 59 | Admitting: Podiatry

## 2020-10-14 ENCOUNTER — Other Ambulatory Visit: Payer: Self-pay

## 2020-10-14 DIAGNOSIS — Z79899 Other long term (current) drug therapy: Secondary | ICD-10-CM | POA: Diagnosis not present

## 2020-10-14 DIAGNOSIS — N186 End stage renal disease: Secondary | ICD-10-CM | POA: Diagnosis not present

## 2020-10-14 DIAGNOSIS — E785 Hyperlipidemia, unspecified: Secondary | ICD-10-CM | POA: Diagnosis not present

## 2020-10-14 DIAGNOSIS — E0843 Diabetes mellitus due to underlying condition with diabetic autonomic (poly)neuropathy: Secondary | ICD-10-CM

## 2020-10-14 DIAGNOSIS — L97522 Non-pressure chronic ulcer of other part of left foot with fat layer exposed: Secondary | ICD-10-CM | POA: Diagnosis not present

## 2020-10-14 DIAGNOSIS — Z794 Long term (current) use of insulin: Secondary | ICD-10-CM | POA: Diagnosis not present

## 2020-10-14 DIAGNOSIS — E109 Type 1 diabetes mellitus without complications: Secondary | ICD-10-CM | POA: Diagnosis not present

## 2020-10-14 DIAGNOSIS — D509 Iron deficiency anemia, unspecified: Secondary | ICD-10-CM | POA: Diagnosis not present

## 2020-10-14 DIAGNOSIS — Z992 Dependence on renal dialysis: Secondary | ICD-10-CM | POA: Diagnosis not present

## 2020-10-14 DIAGNOSIS — Z5181 Encounter for therapeutic drug level monitoring: Secondary | ICD-10-CM | POA: Diagnosis not present

## 2020-10-14 LAB — HEMOGLOBIN A1C: Hemoglobin A1C: 13.3

## 2020-10-14 NOTE — Progress Notes (Signed)
   Subjective:  Patient presents today for follow-up evaluation of an ulcer to the left hallux.  Patient states that the redness and swelling has improved significantly.  He is doing very well with no new complaints at this time  Past Medical History:  Diagnosis Date  . ESRD (end stage renal disease) ([place])   . Gastroparesis   . Herniated cervical disc   . HTN (hypertension)   . Hypercholesteremia   . Morbid obesity ()   . S/P cardiac cath    a. 10-15 yrs ago at [facility] due to tachycardia, reportedly normal. b. Normal ETT 03/2012.  Marland Kitchen Sinus tachycardia    a. 24-hr Holter 03/2012 - SR, occ PVCs, no VT, avg HR 96bpm.  . TIA (transient ischemic attack)   . Type 1 diabetes mellitus ([place])    a. With insulin pump.      Objective/Physical Exam Neurovascular status intact. Maceration noted to the incision site has improved significantly.   Wound #1 noted to the left hallux measuring 0.3x0.3 x 0.2 cm.   To the above-noted ulceration, there is no eschar. There is a moderate amount of slough, fibrin and necrotic tissue. Granulation tissue and wound base is red. There is no malodor.  There is some increased erythema and edema around the ulceration site with increased drainage.  Periwound is slightly macerated.  There is no exposed bone muscle tendon ligament or joint.   Assessment: 1. s/p exostectomy left hallux. DOS: 11/16/2018 2. 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 Patient was evaluated. 2.  Medically necessary excisional debridement including subcutaneous tissue was performed using a tissue nipper.  Excisional debridement of all necrotic nonviable tissue down to the healthy bleeding viable tissue was performed with post debridement measurement same as pre- 3.   Continue wearing diabetic shoes with felt offloading pads applied to the insoles 4. continue collagen wound matrix that was provided for the patient last visit.  Apply daily with a light  dressing  5.  Return to clinic 3 weeks  Edrick Kins, DPM [facility]  Dr. Edrick Kins, DPM    [address], [place] [address]                Office [phone]  Fax [phone]

## 2020-10-15 DIAGNOSIS — Z992 Dependence on renal dialysis: Secondary | ICD-10-CM | POA: Diagnosis not present

## 2020-10-15 DIAGNOSIS — N186 End stage renal disease: Secondary | ICD-10-CM | POA: Diagnosis not present

## 2020-10-16 DIAGNOSIS — N186 End stage renal disease: Secondary | ICD-10-CM | POA: Diagnosis not present

## 2020-10-16 DIAGNOSIS — Z992 Dependence on renal dialysis: Secondary | ICD-10-CM | POA: Diagnosis not present

## 2020-10-17 DIAGNOSIS — Z992 Dependence on renal dialysis: Secondary | ICD-10-CM | POA: Diagnosis not present

## 2020-10-17 DIAGNOSIS — N186 End stage renal disease: Secondary | ICD-10-CM | POA: Diagnosis not present

## 2020-10-18 DIAGNOSIS — Z992 Dependence on renal dialysis: Secondary | ICD-10-CM | POA: Diagnosis not present

## 2020-10-18 DIAGNOSIS — N186 End stage renal disease: Secondary | ICD-10-CM | POA: Diagnosis not present

## 2020-10-19 DIAGNOSIS — Z992 Dependence on renal dialysis: Secondary | ICD-10-CM | POA: Diagnosis not present

## 2020-10-19 DIAGNOSIS — N186 End stage renal disease: Secondary | ICD-10-CM | POA: Diagnosis not present

## 2020-10-20 DIAGNOSIS — Z992 Dependence on renal dialysis: Secondary | ICD-10-CM | POA: Diagnosis not present

## 2020-10-20 DIAGNOSIS — N186 End stage renal disease: Secondary | ICD-10-CM | POA: Diagnosis not present

## 2020-10-21 DIAGNOSIS — N186 End stage renal disease: Secondary | ICD-10-CM | POA: Diagnosis not present

## 2020-10-21 DIAGNOSIS — Z992 Dependence on renal dialysis: Secondary | ICD-10-CM | POA: Diagnosis not present

## 2020-10-22 ENCOUNTER — Other Ambulatory Visit: Payer: Self-pay | Admitting: Nephrology

## 2020-10-22 DIAGNOSIS — N186 End stage renal disease: Secondary | ICD-10-CM | POA: Diagnosis not present

## 2020-10-22 DIAGNOSIS — Z992 Dependence on renal dialysis: Secondary | ICD-10-CM | POA: Diagnosis not present

## 2020-10-23 DIAGNOSIS — N186 End stage renal disease: Secondary | ICD-10-CM | POA: Diagnosis not present

## 2020-10-23 DIAGNOSIS — Z992 Dependence on renal dialysis: Secondary | ICD-10-CM | POA: Diagnosis not present

## 2020-10-24 ENCOUNTER — Other Ambulatory Visit: Payer: Self-pay | Admitting: Nephrology

## 2020-10-24 DIAGNOSIS — N186 End stage renal disease: Secondary | ICD-10-CM | POA: Diagnosis not present

## 2020-10-24 DIAGNOSIS — Z992 Dependence on renal dialysis: Secondary | ICD-10-CM | POA: Diagnosis not present

## 2020-10-25 DIAGNOSIS — N186 End stage renal disease: Secondary | ICD-10-CM | POA: Diagnosis not present

## 2020-10-25 DIAGNOSIS — Z992 Dependence on renal dialysis: Secondary | ICD-10-CM | POA: Diagnosis not present

## 2020-10-26 DIAGNOSIS — Z992 Dependence on renal dialysis: Secondary | ICD-10-CM | POA: Diagnosis not present

## 2020-10-26 DIAGNOSIS — N186 End stage renal disease: Secondary | ICD-10-CM | POA: Diagnosis not present

## 2020-10-27 DIAGNOSIS — N186 End stage renal disease: Secondary | ICD-10-CM | POA: Diagnosis not present

## 2020-10-27 DIAGNOSIS — Z992 Dependence on renal dialysis: Secondary | ICD-10-CM | POA: Diagnosis not present

## 2020-10-28 DIAGNOSIS — N186 End stage renal disease: Secondary | ICD-10-CM | POA: Diagnosis not present

## 2020-10-28 DIAGNOSIS — Z992 Dependence on renal dialysis: Secondary | ICD-10-CM | POA: Diagnosis not present

## 2020-10-29 DIAGNOSIS — Z992 Dependence on renal dialysis: Secondary | ICD-10-CM | POA: Diagnosis not present

## 2020-10-29 DIAGNOSIS — N186 End stage renal disease: Secondary | ICD-10-CM | POA: Diagnosis not present

## 2020-10-30 DIAGNOSIS — N186 End stage renal disease: Secondary | ICD-10-CM | POA: Diagnosis not present

## 2020-10-30 DIAGNOSIS — Z992 Dependence on renal dialysis: Secondary | ICD-10-CM | POA: Diagnosis not present

## 2020-10-31 DIAGNOSIS — Z992 Dependence on renal dialysis: Secondary | ICD-10-CM | POA: Diagnosis not present

## 2020-10-31 DIAGNOSIS — N186 End stage renal disease: Secondary | ICD-10-CM | POA: Diagnosis not present

## 2020-11-01 DIAGNOSIS — N186 End stage renal disease: Secondary | ICD-10-CM | POA: Diagnosis not present

## 2020-11-01 DIAGNOSIS — Z992 Dependence on renal dialysis: Secondary | ICD-10-CM | POA: Diagnosis not present

## 2020-11-02 DIAGNOSIS — Z992 Dependence on renal dialysis: Secondary | ICD-10-CM | POA: Diagnosis not present

## 2020-11-02 DIAGNOSIS — N186 End stage renal disease: Secondary | ICD-10-CM | POA: Diagnosis not present

## 2020-11-03 DIAGNOSIS — Z992 Dependence on renal dialysis: Secondary | ICD-10-CM | POA: Diagnosis not present

## 2020-11-03 DIAGNOSIS — N186 End stage renal disease: Secondary | ICD-10-CM | POA: Diagnosis not present

## 2020-11-04 ENCOUNTER — Ambulatory Visit: Payer: 59 | Admitting: Podiatry

## 2020-11-04 DIAGNOSIS — Z992 Dependence on renal dialysis: Secondary | ICD-10-CM | POA: Diagnosis not present

## 2020-11-04 DIAGNOSIS — N186 End stage renal disease: Secondary | ICD-10-CM | POA: Diagnosis not present

## 2020-11-05 DIAGNOSIS — N186 End stage renal disease: Secondary | ICD-10-CM | POA: Diagnosis not present

## 2020-11-05 DIAGNOSIS — Z992 Dependence on renal dialysis: Secondary | ICD-10-CM | POA: Diagnosis not present

## 2020-11-06 DIAGNOSIS — Z992 Dependence on renal dialysis: Secondary | ICD-10-CM | POA: Diagnosis not present

## 2020-11-06 DIAGNOSIS — N186 End stage renal disease: Secondary | ICD-10-CM | POA: Diagnosis not present

## 2020-11-07 ENCOUNTER — Other Ambulatory Visit: Payer: Self-pay | Admitting: Nephrology

## 2020-11-07 DIAGNOSIS — Z01810 Encounter for preprocedural cardiovascular examination: Secondary | ICD-10-CM | POA: Diagnosis not present

## 2020-11-07 DIAGNOSIS — N186 End stage renal disease: Secondary | ICD-10-CM | POA: Diagnosis not present

## 2020-11-07 DIAGNOSIS — I517 Cardiomegaly: Secondary | ICD-10-CM | POA: Diagnosis not present

## 2020-11-07 DIAGNOSIS — I6522 Occlusion and stenosis of left carotid artery: Secondary | ICD-10-CM | POA: Diagnosis not present

## 2020-11-07 DIAGNOSIS — I348 Other nonrheumatic mitral valve disorders: Secondary | ICD-10-CM | POA: Diagnosis not present

## 2020-11-07 DIAGNOSIS — Z992 Dependence on renal dialysis: Secondary | ICD-10-CM | POA: Diagnosis not present

## 2020-11-07 DIAGNOSIS — I340 Nonrheumatic mitral (valve) insufficiency: Secondary | ICD-10-CM | POA: Diagnosis not present

## 2020-11-07 DIAGNOSIS — N189 Chronic kidney disease, unspecified: Secondary | ICD-10-CM | POA: Diagnosis not present

## 2020-11-07 DIAGNOSIS — Z01818 Encounter for other preprocedural examination: Secondary | ICD-10-CM | POA: Diagnosis not present

## 2020-11-08 DIAGNOSIS — Z992 Dependence on renal dialysis: Secondary | ICD-10-CM | POA: Diagnosis not present

## 2020-11-08 DIAGNOSIS — N186 End stage renal disease: Secondary | ICD-10-CM | POA: Diagnosis not present

## 2020-11-09 DIAGNOSIS — N186 End stage renal disease: Secondary | ICD-10-CM | POA: Diagnosis not present

## 2020-11-09 DIAGNOSIS — Z992 Dependence on renal dialysis: Secondary | ICD-10-CM | POA: Diagnosis not present

## 2020-11-10 DIAGNOSIS — D509 Iron deficiency anemia, unspecified: Secondary | ICD-10-CM | POA: Diagnosis not present

## 2020-11-10 DIAGNOSIS — N186 End stage renal disease: Secondary | ICD-10-CM | POA: Diagnosis not present

## 2020-11-10 DIAGNOSIS — Z992 Dependence on renal dialysis: Secondary | ICD-10-CM | POA: Diagnosis not present

## 2020-11-11 DIAGNOSIS — Z01818 Encounter for other preprocedural examination: Secondary | ICD-10-CM | POA: Diagnosis not present

## 2020-11-11 DIAGNOSIS — N186 End stage renal disease: Secondary | ICD-10-CM | POA: Diagnosis not present

## 2020-11-11 DIAGNOSIS — Z992 Dependence on renal dialysis: Secondary | ICD-10-CM | POA: Diagnosis not present

## 2020-11-12 DIAGNOSIS — N186 End stage renal disease: Secondary | ICD-10-CM | POA: Diagnosis not present

## 2020-11-12 DIAGNOSIS — Z992 Dependence on renal dialysis: Secondary | ICD-10-CM | POA: Diagnosis not present

## 2020-11-13 DIAGNOSIS — Z992 Dependence on renal dialysis: Secondary | ICD-10-CM | POA: Diagnosis not present

## 2020-11-13 DIAGNOSIS — N186 End stage renal disease: Secondary | ICD-10-CM | POA: Diagnosis not present

## 2020-11-14 DIAGNOSIS — Z992 Dependence on renal dialysis: Secondary | ICD-10-CM | POA: Diagnosis not present

## 2020-11-14 DIAGNOSIS — N186 End stage renal disease: Secondary | ICD-10-CM | POA: Diagnosis not present

## 2020-11-15 DIAGNOSIS — N186 End stage renal disease: Secondary | ICD-10-CM | POA: Diagnosis not present

## 2020-11-15 DIAGNOSIS — Z992 Dependence on renal dialysis: Secondary | ICD-10-CM | POA: Diagnosis not present

## 2020-11-16 DIAGNOSIS — N186 End stage renal disease: Secondary | ICD-10-CM | POA: Diagnosis not present

## 2020-11-16 DIAGNOSIS — Z992 Dependence on renal dialysis: Secondary | ICD-10-CM | POA: Diagnosis not present

## 2020-11-17 DIAGNOSIS — N186 End stage renal disease: Secondary | ICD-10-CM | POA: Diagnosis not present

## 2020-11-17 DIAGNOSIS — Z992 Dependence on renal dialysis: Secondary | ICD-10-CM | POA: Diagnosis not present

## 2020-11-18 DIAGNOSIS — N186 End stage renal disease: Secondary | ICD-10-CM | POA: Diagnosis not present

## 2020-11-18 DIAGNOSIS — Z20828 Contact with and (suspected) exposure to other viral communicable diseases: Secondary | ICD-10-CM | POA: Diagnosis not present

## 2020-11-18 DIAGNOSIS — Z992 Dependence on renal dialysis: Secondary | ICD-10-CM | POA: Diagnosis not present

## 2020-11-18 DIAGNOSIS — H6501 Acute serous otitis media, right ear: Secondary | ICD-10-CM | POA: Diagnosis not present

## 2020-11-19 DIAGNOSIS — N186 End stage renal disease: Secondary | ICD-10-CM | POA: Diagnosis not present

## 2020-11-19 DIAGNOSIS — Z992 Dependence on renal dialysis: Secondary | ICD-10-CM | POA: Diagnosis not present

## 2020-11-20 DIAGNOSIS — Z992 Dependence on renal dialysis: Secondary | ICD-10-CM | POA: Diagnosis not present

## 2020-11-20 DIAGNOSIS — N186 End stage renal disease: Secondary | ICD-10-CM | POA: Diagnosis not present

## 2020-11-21 DIAGNOSIS — Z992 Dependence on renal dialysis: Secondary | ICD-10-CM | POA: Diagnosis not present

## 2020-11-21 DIAGNOSIS — N186 End stage renal disease: Secondary | ICD-10-CM | POA: Diagnosis not present

## 2020-11-22 DIAGNOSIS — N186 End stage renal disease: Secondary | ICD-10-CM | POA: Diagnosis not present

## 2020-11-22 DIAGNOSIS — Z992 Dependence on renal dialysis: Secondary | ICD-10-CM | POA: Diagnosis not present

## 2020-11-23 DIAGNOSIS — Z992 Dependence on renal dialysis: Secondary | ICD-10-CM | POA: Diagnosis not present

## 2020-11-23 DIAGNOSIS — N186 End stage renal disease: Secondary | ICD-10-CM | POA: Diagnosis not present

## 2020-11-24 DIAGNOSIS — N186 End stage renal disease: Secondary | ICD-10-CM | POA: Diagnosis not present

## 2020-11-24 DIAGNOSIS — Z992 Dependence on renal dialysis: Secondary | ICD-10-CM | POA: Diagnosis not present

## 2020-11-25 DIAGNOSIS — N186 End stage renal disease: Secondary | ICD-10-CM | POA: Diagnosis not present

## 2020-11-25 DIAGNOSIS — Z992 Dependence on renal dialysis: Secondary | ICD-10-CM | POA: Diagnosis not present

## 2020-11-26 ENCOUNTER — Other Ambulatory Visit: Payer: Self-pay | Admitting: Nephrology

## 2020-11-26 DIAGNOSIS — N186 End stage renal disease: Secondary | ICD-10-CM | POA: Diagnosis not present

## 2020-11-26 DIAGNOSIS — Z992 Dependence on renal dialysis: Secondary | ICD-10-CM | POA: Diagnosis not present

## 2020-11-27 DIAGNOSIS — N186 End stage renal disease: Secondary | ICD-10-CM | POA: Diagnosis not present

## 2020-11-27 DIAGNOSIS — Z992 Dependence on renal dialysis: Secondary | ICD-10-CM | POA: Diagnosis not present

## 2020-11-28 DIAGNOSIS — Z992 Dependence on renal dialysis: Secondary | ICD-10-CM | POA: Diagnosis not present

## 2020-11-28 DIAGNOSIS — N186 End stage renal disease: Secondary | ICD-10-CM | POA: Diagnosis not present

## 2020-11-29 DIAGNOSIS — Z992 Dependence on renal dialysis: Secondary | ICD-10-CM | POA: Diagnosis not present

## 2020-11-29 DIAGNOSIS — N186 End stage renal disease: Secondary | ICD-10-CM | POA: Diagnosis not present

## 2020-11-30 DIAGNOSIS — Z992 Dependence on renal dialysis: Secondary | ICD-10-CM | POA: Diagnosis not present

## 2020-11-30 DIAGNOSIS — N186 End stage renal disease: Secondary | ICD-10-CM | POA: Diagnosis not present

## 2020-12-01 ENCOUNTER — Encounter: Payer: Self-pay | Admitting: *Deleted

## 2020-12-01 ENCOUNTER — Ambulatory Visit: Payer: 59 | Admitting: Endocrinology

## 2020-12-01 ENCOUNTER — Other Ambulatory Visit: Payer: Self-pay | Admitting: Endocrinology

## 2020-12-01 ENCOUNTER — Encounter: Payer: Self-pay | Admitting: Endocrinology

## 2020-12-01 ENCOUNTER — Other Ambulatory Visit: Payer: Self-pay

## 2020-12-01 VITALS — BP 156/86 | HR 82 | Ht 72.0 in | Wt 258.0 lb

## 2020-12-01 DIAGNOSIS — E1142 Type 2 diabetes mellitus with diabetic polyneuropathy: Secondary | ICD-10-CM | POA: Diagnosis not present

## 2020-12-01 DIAGNOSIS — N186 End stage renal disease: Secondary | ICD-10-CM | POA: Diagnosis not present

## 2020-12-01 DIAGNOSIS — E1065 Type 1 diabetes mellitus with hyperglycemia: Secondary | ICD-10-CM | POA: Diagnosis not present

## 2020-12-01 DIAGNOSIS — Z992 Dependence on renal dialysis: Secondary | ICD-10-CM | POA: Diagnosis not present

## 2020-12-01 MED ORDER — FREESTYLE LIBRE 2 SENSOR MISC: 2.0000 | 3 refills | Status: DC

## 2020-12-01 NOTE — Progress Notes (Signed)
Patient ID: [name], male   DOB: 01/23/1971, 50 y.o.   MRN: [id number]           Reason for Appointment : Consultation for Type 1 Diabetes  History of Present Illness   Referring HCP: Dr. Holley Raring         Diagnosis: Type 1 diabetes mellitus, date of diagnosis:  1997        Previous history:  He has been variously on insulin injections and insulin pumps since his diagnosis He has been on insulin pump on and off for the last few years with fair control  Recent history:   INSULIN pump settings with the MINIMED 670 G pump: Basal rate 12 AM = 1.0 and 5:30 AM = 1.15 Carbohydrate coverage 1: 9.5 Sensitivity 1: 40 with target 120 and active insulin 3 hours  A1c on 10/14/2020 was 13.3  Current management, blood sugar patterns and problems identified:    Since starting peritoneal dialysis in 06/2020 his blood sugars have been much higher; apparently previously his A1c was about 9%   He has not followed up with his endocrinologist for several months  Also has not made any changes in his settings  Currently not using  the sensor, however in the past this has not been accurate reportedly  Blood sugars have been checked on an average 2.8 times a day  HIGHEST blood sugars are late in the evenings and overnight with averages over 300     He is trying to bolus at mealtimes and is fairly consistent with entering his blood sugars at the time of bolusing  However not clear if he is consistently eating breakfast or lunch with some missing boluses  Only today at lunchtime highest blood sugar was 140 but he had to boluses this morning normally at breakfast or in the morning he has only at the most 1 bolus  Also blood sugars even after doing boluses at mealtimes do not subsequently come down  Appears to be changing his infusion set every 5 days or more; mostly using the left side of his abdomen since the right side is for his dialysis catheter  He is having peritoneal dialysis hooked  up overnight from 8 PM-6 AM  He does not know whether he is using a higher concentrated dialysis fluid on some days but this is based on his weight at night  He does not do any formal exercise but he is walking during the day at work up to 45 miles total  Blood sugar reading from pump download:   PRE-MEAL Fasting Lunch Dinner Bedtime Overall  Glucose range:   140-200+     Mean/median:  356   247   302+/-146    Hypoglycemia:  Not a problem lately         Self-care: Typical breakfast will be toast, eggs and bacon Usually lunch will be chicken, pasta and green beans Typical dinner: Steak asparagus and corn Snacks peanut butter crackers and chips   Mealtimes are: Dinner: 6-9 PM                Dietician consultation: Most recent: none.         CDE consultation: Years ago  Diabetes labs:  Lab Results  Component Value Date   HGBA1C 13.3 10/14/2020   HGBA1C 10.5 (H) 01/23/2020   HGBA1C 10.2 (H) 09/27/2019   Lab Results  Component Value Date   LDLCALC 190 (H) 08/29/2018   CREATININE 4.30 (H) 01/28/2020  No results found for: MICRALBCREAT   Allergies as of 12/01/2020      Reactions   Sulfa Antibiotics Anaphylaxis, Swelling   Pt is not allergic to iodine, has had iodine in the past w/o premeds and w/ no problems   Oxycodone Nausea And Vomiting   Oxycontin [oxycodone Hcl] Nausea And Vomiting   Penicillins Other (See Comments)   Possible reaction many years ago per patient Other reaction(s): Other (See Comments), Unknown Possible reaction many years ago per patient   Prednisone Other (See Comments)   Patient is diabetic, runs sugar up Other reaction(s): Other (See Comments), Other (See Comments) Patient is diabetic, runs sugar up Patient is diabetic, runs sugar up Patient is diabetic, runs sugar up    Shellfish-derived Products    Pt is not allergic to iodine, has had iodine in the past w/o premeds and w/ no problems      Medication List       Accurate as of  December 01, 2020  8:55 PM. If you have any questions, ask your nurse or doctor.        acetaminophen 500 MG tablet Commonly known as: TYLENOL Take 500-1,000 mg by mouth every 6 (six) hours as needed for mild pain or fever.   acetaminophen 500 MG tablet Commonly known as: TYLENOL Take by mouth.   amLODipine 10 MG tablet Commonly known as: NORVASC Take 1 tablet (10 mg total) by mouth daily. What changed: Another medication with the same name was removed. Continue taking this medication, and follow the directions you see here. Changed by: Elayne Snare, MD   aspirin 81 MG EC tablet Take 81 mg by mouth every other day.   aspirin 81 MG EC tablet Take by mouth.   atorvastatin 80 MG tablet Commonly known as: LIPITOR Take 80 mg by mouth every evening.   atorvastatin 80 MG tablet Commonly known as: LIPITOR Take by mouth.   calcitRIOL 0.25 MCG capsule Commonly known as: ROCALTROL Take by mouth.   carvedilol 25 MG tablet Commonly known as: COREG Take 1 tablet (25 mg total) by mouth daily.   cetirizine 10 MG chewable tablet Commonly known as: ZYRTEC Chew by mouth.   cinacalcet 30 MG tablet Commonly known as: SENSIPAR Take 30 mg by mouth daily.   cloNIDine 0.1 MG tablet Commonly known as: CATAPRES   doxycycline 100 MG tablet Commonly known as: VIBRA-TABS Take 1 tablet (100 mg total) by mouth 2 (two) times daily.   FreeStyle Libre 2 Sensor Misc 2 Devices by Does not apply route every 14 (fourteen) days. Started by: Elayne Snare, MD   furosemide 40 MG tablet Commonly known as: LASIX Take 1 tablet (40 mg total) by mouth daily as needed for fluid or edema.   gentamicin cream 0.1 % Commonly known as: GARAMYCIN Apply 1 application topically 2 (two) times daily.   HumaLOG 100 UNIT/ML injection Generic drug: insulin lispro Inject 100 Units into the skin daily.   hydrALAZINE 50 MG tablet Commonly known as: APRESOLINE Take 0.5 tablets (25 mg total) by mouth 3 (three)  times daily. What changed:   how much to take  when to take this   HYDROcodone-acetaminophen 5-325 MG tablet Commonly known as: NORCO/VICODIN Take 1 tablet by mouth 3 (three) times daily as needed.   Lokelma 10 g Pack packet Generic drug: sodium zirconium cyclosilicate Take by mouth.   Lokelma 10 g Pack packet Generic drug: sodium zirconium cyclosilicate   losartan 009 MG tablet Commonly known as: COZAAR Take  100 mg by mouth daily. What changed: Another medication with the same name was removed. Continue taking this medication, and follow the directions you see here. Changed by: Elayne Snare, MD   ondansetron 4 MG disintegrating tablet Commonly known as: ZOFRAN-ODT Take 4 mg by mouth every 8 (eight) hours as needed.   ondansetron 8 MG tablet Commonly known as: ZOFRAN Take 8 mg by mouth every 8 (eight) hours as needed.   pantoprazole 40 MG tablet Commonly known as: Protonix Take 1 tablet (40 mg total) by mouth daily. What changed:   when to take this  reasons to take this   promethazine 25 MG tablet Commonly known as: PHENERGAN Take 1/2 (12.5mg ) to 1 (25mg ) tab as needed for nausea and vomiting not controlled with Zofran up to every 6 hours.   sevelamer carbonate 800 MG tablet Commonly known as: RENVELA Take by mouth.   sodium chloride 0.65 % Soln nasal spray Commonly known as: OCEAN Place 1 spray into both nostrils as needed for congestion.   Veltassa 8.4 g packet Generic drug: patiromer Take by mouth.       Allergies:  Allergies  Allergen Reactions  . Sulfa Antibiotics Anaphylaxis and Swelling    Pt is not allergic to iodine, has had iodine in the past w/o premeds and w/ no problems  . Oxycodone Nausea And Vomiting  . Oxycontin [Oxycodone Hcl] Nausea And Vomiting  . Penicillins Other (See Comments)    Possible reaction many years ago per patient Other reaction(s): Other (See Comments), Unknown Possible reaction many years ago per patient  .  Prednisone Other (See Comments)    Patient is diabetic, runs sugar up Other reaction(s): Other (See Comments), Other (See Comments) Patient is diabetic, runs sugar up Patient is diabetic, runs sugar up Patient is diabetic, runs sugar up   . Shellfish-Derived Products     Pt is not allergic to iodine, has had iodine in the past w/o premeds and w/ no problems    Past Medical History:  Diagnosis Date  . ESRD (end stage renal disease) ([place])   . Gastroparesis   . Herniated cervical disc   . HTN (hypertension)   . Hypercholesteremia   . Morbid obesity ([place])   . S/P cardiac cath    a. 10-15 yrs ago at [facility] due to tachycardia, reportedly normal. b. Normal ETT 03/2012.  Marland Kitchen Sinus tachycardia    a. 24-hr Holter 03/2012 - SR, occ PVCs, no VT, avg HR 96bpm.  . TIA (transient ischemic attack)   . Type 1 diabetes mellitus ([place])    a. With insulin pump.    Past Surgical History:  Procedure Laterality Date  . BIOPSY  10/03/2019   Procedure: BIOPSY;  Surgeon: Mauri Pole, MD;  Location: [facility] ENDOSCOPY;  Service: Endoscopy;;  . cervical neck fusion    . ESOPHAGOGASTRODUODENOSCOPY (EGD) WITH PROPOFOL N/A 10/03/2019   Procedure: ESOPHAGOGASTRODUODENOSCOPY (EGD) WITH PROPOFOL;  Surgeon: Mauri Pole, MD;  Location: [facility] ENDOSCOPY;  Service: Endoscopy;  Laterality: N/A;  . HERNIA REPAIR     bilateral  . LEFT HEART CATHETERIZATION WITH CORONARY ANGIOGRAM N/A 12/18/2013   Procedure: LEFT HEART CATHETERIZATION WITH CORONARY ANGIOGRAM;  Surgeon: [name] M Martinique, MD;  Location: [facility] CATH LAB;  Service: Cardiovascular;  Laterality: N/A;    Family History  Problem Relation Age of Onset  . Hypertension Other   . Coronary artery disease Other        Mother's side - both her side's grandparents died of heart  disease (MIs)  . Diabetes Other   . Stroke Other        Paternal grandfather (66)  . Prostate cancer Neg Hx   . Bladder Cancer Neg Hx   . Kidney cancer Neg Hx     Social History:  reports  that he has never smoked. He has never used smokeless tobacco. He reports that he does not drink alcohol and does not use drugs.      Review of Systems  Constitutional: Positive for weight gain.  Eyes: Negative for blurred vision.  Respiratory: Negative for shortness of breath.        On treatment for sleep apnea  Cardiovascular: Positive for leg swelling.  Gastrointestinal: Negative for nausea and abdominal pain.  Endocrine: Positive for fatigue.  Genitourinary: Negative for frequency.  Musculoskeletal: Negative for joint pain.  Skin: Negative for rash.  Neurological: Positive for numbness.        Lipids: Has been treated with 80 mg atorvastatin, no labs available  Lab Results  Component Value Date   CHOL 257 (H) 08/29/2018   HDL 43 08/29/2018   LDLCALC 190 (H) 08/29/2018   TRIG 119 08/29/2018   CHOLHDL 6.0 08/29/2018    Last dilated eye exam was in 60/63  DIABETES COMPLICATIONS: Nephropathy, diabetic foot ulcers    Physical Examination:  BP (!) 156/86   Pulse 82   Ht 6' (1.829 m)   Wt 258 lb (117 kg)   SpO2 98%   BMI 34.99 kg/m   GENERAL:  Mild obesity present HEENT:         Eye exam shows normal external appearance. Fundus exam deferred to ophthalmologist  Oral exam shows normal mucosa .  NECK:         there is no lymphadenopathy.   Thyroid is not enlarged and no nodules felt.    LUNGS:         Chest is symmetrical. Lungs are clear to auscultation.Marland Kitchen   HEART:         Heart sounds:  S1 and S2 are normal. No murmurs or clicks heard., no S3 or S4.   ABDOMEN:  no distention present. Liver and spleen are not palpable. No other mass or tenderness present.   EXTREMITIES:     There is 2+ lower leg edema.  No skin lesions present.  No ulcers on the feet.   NEUROLOGICAL:        Vibration sense is nearly absent in toes. Ankle jerks are absent bilaterally.       Diabetic Foot Exam - Simple   Simple Foot Form Visual Inspection No deformities, no ulcerations, no  other skin breakdown bilaterally: Yes Sensation Testing See comments: Yes Pulse Check Posterior Tibialis and Dorsalis pulse intact bilaterally: Yes See comments: Yes Comments Monofilament sensation decreased in the right third through fifth toes as well as plantar surfaces. Left dorsalis pedis not palpable         MUSCULOSKELETAL:       There is no enlargement or deformity of the joints.  SKIN:       No rash, lesions or abnormal pigmentation       ASSESSMENT:  Diabetes type 1, poorly controlled  A1c recently 13.3, previously about 8%  Problems identified:  Despite using his insulin pump is not using the guardian sensor and fingerstick monitoring is somewhat inadequate  Appears to have marked hyperglycemia with starting peritoneal dialysis related to additional glucose load with the dialysate fluid  Blood sugars are higher overnight  and late evening but also during the daytime  Although he has some fluctuation in his blood sugars he appears to be needing both increased basal bolus insulin for improved control.   Complications: Nephropathy, neuropathy, unknown status of retinopathy  PLAN:   1. Glucose monitoring: . He will benefit from using continuous glucose monitoring and until he is able to get the guardian sensor he will start using the freestyle libre system using his phone to scan the readings.  Discussed in detail how the freestyle Elenor Legato is used, application sites and technique, data obtained.  Sensor was prescribed to his pharmacy and patient information brochure given  2.  Diabetes education: . May need further diabetes education especially if starting a new pump  3.  Lifestyle changes: . Currently is doing fairly well with being active and generally getting a low-fat diet although may be able to do better   4.  Insulin pump changes prescribed: . As below his basal and bolus settings will be adjusted, he can program this at home . On the days he is using the  higher glucose dialysate he will use a higher basal setting and given him 2 alternative basal programs which he can adjust based on which dialysis he will be starting at night . He will contact [employer] to see if he is eligible for the 770 pump with the new sensor and if he can use this is a 45-day trial.  Alternatively he may be able to use one of the other pump options with the closed-loop systems . He was also advised to make sure he changes his infusion set twice a week . To rotate sites of his infusion set more laterally on the left side  5.  General . Will need to review reports of lipid panel and to make sure he is getting adequate treatment and periodic testing . To get reports from his ophthalmology exams  6.  Follow-up: 3 weeks   New basal settings as follows  BASAL profile 1 for the lower glucose concentration dialysate:  12 AM-8 AM = 1.8.  8 AM-6 PM = 1.4.  8 PM-12 AM = 1.8  BASAL profile 2 for HIGHER glucose concentration 12 AM-8 AM = 2.1.  8 AM-6 PM = 1.5.  8 PM-12 AM = 2.0  Carbohydrate coverage: 12 AM-12 PM = 1: 7, 12 PM-5 AM = 1: 8 and 5 PM-12 AM = 1: 7  Insulin sensitivity/correction factor: 12 AM-8 AM = 1: 20, 8 AM-8 PM = 1: 30 and 8 PM-12 AM = 1: 20  ACTIVE insulin time 4 hours  Patient Instructions  Basal settings as follows  BASAL profile 1 for the lower glucose concentration dialysate:  12 AM-8 AM = 1.8.  8 AM-6 PM = 1.4.  8 PM-12 AM = 1.8  BASAL profile 2 for HIGHER glucose concentration 12 AM-8 AM = 2.1.  8 AM-6 PM = 1.5.  8 PM-12 AM = 2.0  Carbohydrate coverage: 12 AM-12 PM = 1: 7, 12 PM-5 AM = 1: 8 and 5 PM-12 AM = 1: 7  Insulin sensitivity/correction factor: 12 AM-8 AM = 1: 20, 8 AM-8 PM = 1: 30 and 8 PM-12 AM = 1: 20  ACTIVE insulin time 4 hours     Elayne Snare 12/01/2020, 8:55 PM   Copy of consultation sent to referring physician   Note: This note was prepared with Dragon voice recognition system technology. Any transcriptional errors  that result from this process are unintentional.

## 2020-12-01 NOTE — Patient Instructions (Signed)
Basal settings as follows  BASAL profile 1 for the lower glucose concentration dialysate:  12 AM-8 AM = 1.8.  8 AM-6 PM = 1.4.  8 PM-12 AM = 1.8  BASAL profile 2 for HIGHER glucose concentration 12 AM-8 AM = 2.1.  8 AM-6 PM = 1.5.  8 PM-12 AM = 2.0  Carbohydrate coverage: 12 AM-12 PM = 1: 7, 12 PM-5 AM = 1: 8 and 5 PM-12 AM = 1: 7  Insulin sensitivity/correction factor: 12 AM-8 AM = 1: 20, 8 AM-8 PM = 1: 30 and 8 PM-12 AM = 1: 20  ACTIVE insulin time 4 hours

## 2020-12-02 DIAGNOSIS — Z992 Dependence on renal dialysis: Secondary | ICD-10-CM | POA: Diagnosis not present

## 2020-12-02 DIAGNOSIS — N186 End stage renal disease: Secondary | ICD-10-CM | POA: Diagnosis not present

## 2020-12-02 DIAGNOSIS — E113393 Type 2 diabetes mellitus with moderate nonproliferative diabetic retinopathy without macular edema, bilateral: Secondary | ICD-10-CM | POA: Diagnosis not present

## 2020-12-03 DIAGNOSIS — N186 End stage renal disease: Secondary | ICD-10-CM | POA: Diagnosis not present

## 2020-12-03 DIAGNOSIS — Z992 Dependence on renal dialysis: Secondary | ICD-10-CM | POA: Diagnosis not present

## 2020-12-04 ENCOUNTER — Ambulatory Visit: Payer: 59 | Admitting: Cardiovascular Disease

## 2020-12-04 ENCOUNTER — Other Ambulatory Visit: Payer: Self-pay

## 2020-12-04 ENCOUNTER — Encounter: Payer: Self-pay | Admitting: Cardiovascular Disease

## 2020-12-04 VITALS — BP 146/82 | HR 76 | Ht 72.0 in | Wt 263.0 lb

## 2020-12-04 DIAGNOSIS — D509 Iron deficiency anemia, unspecified: Secondary | ICD-10-CM | POA: Diagnosis not present

## 2020-12-04 DIAGNOSIS — Z992 Dependence on renal dialysis: Secondary | ICD-10-CM | POA: Diagnosis not present

## 2020-12-04 DIAGNOSIS — I10 Essential (primary) hypertension: Secondary | ICD-10-CM | POA: Diagnosis not present

## 2020-12-04 DIAGNOSIS — E785 Hyperlipidemia, unspecified: Secondary | ICD-10-CM | POA: Diagnosis not present

## 2020-12-04 DIAGNOSIS — Z01810 Encounter for preprocedural cardiovascular examination: Secondary | ICD-10-CM

## 2020-12-04 DIAGNOSIS — N186 End stage renal disease: Secondary | ICD-10-CM | POA: Diagnosis not present

## 2020-12-04 NOTE — Progress Notes (Signed)
Cardiology Office Note   Date:  12/04/2020   ID:  [name], DOB 1971-01-09, MRN [id number]  PCP:  [name], MD  Cardiologist:   Kathlyn Sacramento, MD   Chief Complaint  Patient presents with  . Follow-up    Pre-evaluation for Kidney Transplant      History of Present Illness: [name] is a 50 y.o. male who is here today for preop cardiovascular evaluation before kidney transplant.    He has known history of type 1 diabetes, chronic kidney disease, sleep apnea, herniated cervical disc, obstructive sleep apnea, hypertension and hyperlipidemia.  He had cardiac catheterization in the remote past due to tachycardia which was reportedly normal.   Previous Holter monitor in 2013 showed occasional PVCs and average heart rate of 96 bpm.  He was hospitalized in December 2020 with DKA and elevated troponin to 10,000.  Echo showed normal LV systolic function.  He had no symptoms of acute coronary syndrome at that time.  She underwent an outpatient Lexiscan Myoview which was normal.  His chronic kidney disease that progressed to end-stage and currently he is on peritoneal dialysis.  He is getting evaluation for kidney transplant in [facility].  He had an echocardiogram done there which showed normal LV systolic function with no significant valvular abnormalities.  He needs to have a stress test done.  He denies chest pain or significant dyspnea.  He is able to perform activities of daily living with no significant limitations.  Past Medical History:  Diagnosis Date  . ESRD (end stage renal disease) ([place])   . Gastroparesis   . Herniated cervical disc   . HTN (hypertension)   . Hypercholesteremia   . Morbid obesity ([place])   . S/P cardiac cath    a. 10-15 yrs ago at [facility] due to tachycardia, reportedly normal. b. Normal ETT 03/2012.  Marland Kitchen Sinus tachycardia    a. 24-hr Holter 03/2012 - SR, occ PVCs, no VT, avg HR 96bpm.  . TIA (transient ischemic attack)   . Type 1  diabetes mellitus ([place])    a. With insulin pump.    Past Surgical History:  Procedure Laterality Date  . BIOPSY  10/03/2019   Procedure: BIOPSY;  Surgeon: Mauri Pole, MD;  Location: [facility] ENDOSCOPY;  Service: Endoscopy;;  . cervical neck fusion    . ESOPHAGOGASTRODUODENOSCOPY (EGD) WITH PROPOFOL N/A 10/03/2019   Procedure: ESOPHAGOGASTRODUODENOSCOPY (EGD) WITH PROPOFOL;  Surgeon: Mauri Pole, MD;  Location: [facility]OS[facility];  Service: Endoscopy;  Laterality: N/A;  . HERNIA REPAIR     bilateral  . LEFT HEART CATHETERIZATION WITH CORONARY ANGIOGRAM N/A 12/18/2013   Procedure: LEFT HEART CATHETERIZATION WITH CORONARY ANGIOGRAM;  Surgeon: [name] M Martinique, MD;  Location: [facility] CATH LAB;  Service: Cardiovascular;  Laterality: N/A;     Current Outpatient Medications  Medication Sig Dispense Refill  . acetaminophen (TYLENOL) 500 MG tablet Take 500-1,000 mg by mouth every 6 (six) hours as needed for mild pain or fever.    Marland Kitchen amLODipine (NORVASC) 10 MG tablet Take 1 tablet (10 mg total) by mouth daily. 30 tablet 0  . atorvastatin (LIPITOR) 80 MG tablet Take 80 mg by mouth every evening.     Marland Kitchen atorvastatin (LIPITOR) 80 MG tablet Take by mouth.    . calcitRIOL (ROCALTROL) 0.25 MCG capsule Take by mouth.    . carvedilol (COREG) 25 MG tablet Take 25 mg by mouth 2 (two) times daily with a meal.    . cinacalcet (  SENSIPAR) 30 MG tablet Take 30 mg by mouth daily.    . cloNIDine (CATAPRES) 0.1 MG tablet     . Continuous Blood Gluc Sensor (FREESTYLE LIBRE 2 SENSOR) MISC 2 Devices by Does not apply route every 14 (fourteen) days. 2 each 3  . furosemide (LASIX) 80 MG tablet Take 80 mg by mouth daily as needed for fluid or edema.    Marland Kitchen HUMALOG 100 UNIT/ML injection Inject 100 Units into the skin daily.  11  . losartan (COZAAR) 100 MG tablet Take 100 mg by mouth daily.     No current facility-administered medications for this visit.    Allergies:   Sulfa antibiotics, Oxycodone, Oxycontin [oxycodone  hcl], Penicillins, Prednisone, and Shellfish-derived products    Social History:  The patient  reports that he has never smoked. He has never used smokeless tobacco. He reports that he does not drink alcohol and does not use drugs.   Family History:  The patient's family history includes Coronary artery disease in an other family member; Diabetes in an other family member; Hypertension in an other family member; Stroke in an other family member.    ROS:  Please see the history of present illness.   Otherwise, review of systems are positive for none.   All other systems are reviewed and negative.    PHYSICAL EXAM: VS:  BP (!) 146/82   Pulse 76   Ht 6' (1.829 m)   Wt 263 lb (119.3 kg)   BMI 35.67 kg/m  , BMI Body mass index is 35.67 kg/m. GEN: Well nourished, well developed, in no acute distress  HEENT: normal  Neck: no JVD, carotid bruits, or masses Cardiac: RRR; no murmurs, rubs, or gallops, mild bilateral leg edema  Respiratory:  clear to auscultation bilaterally, normal work of breathing GI: soft, nontender, nondistended, + BS MS: no deformity or atrophy  Skin: warm and dry, no rash Neuro:  Strength and sensation are intact Psych: euthymic mood, full affect   EKG:  EKG is ordered today. The ekg ordered today demonstrates normal sinus rhythm with no significant ST or T wave changes.  First-degree AV block.   Recent Labs: 01/23/2020: ALT 21; B Natriuretic Peptide 67.0 01/28/2020: BUN 49; Creatinine, Ser 4.30; Hemoglobin 10.4; Magnesium 2.3; Platelets 242; Potassium 4.4; Sodium 134    Lipid Panel    Component Value Date/Time   CHOL 257 (H) 08/29/2018 0431   TRIG 119 08/29/2018 0431   HDL 43 08/29/2018 0431   CHOLHDL 6.0 08/29/2018 0431   VLDL 24 08/29/2018 0431   LDLCALC 190 (H) 08/29/2018 0431      Wt Readings from Last 3 Encounters:  12/04/20 263 lb (119.3 kg)  12/01/20 258 lb (117 kg)  01/23/20 244 lb 11.4 oz (111 kg)      PAD Screen 03/31/2018  Previous PAD  dx? No  Previous surgical procedure? No  Pain with walking? No  Feet/toe relief with dangling? No  Painful, non-healing ulcers? No  Extremities discolored? No      ASSESSMENT AND PLAN:  1.  Preop cardiovascular evaluation for kidney transplant: Patient has no anginal symptoms.  Functional capacity is good greater than 4 METS.  Baseline EKG is normal.  Recommend evaluation with Lexiscan Myoview.  He reports that his most recent stress test was canceled due to elevated blood pressure.  That should not be an issue with Lexiscan. Echocardiogram did show normal LV systolic function and no significant valvular abnormalities.  2.  Essential hypertension: Blood pressure is  reasonably controlled on current medications.  3.  End-stage renal disease: Currently on peritoneal dialysis.  6.  Hyperlipidemia: Currently on atorvastatin 80 mg daily.  Recommend a target LDL of less than 70 given that he is diabetic.    Disposition:   FU with me in 12 months  Signed,  Kathlyn Sacramento, MD  12/04/2020 12:23 PM    [facility]

## 2020-12-04 NOTE — Patient Instructions (Signed)
Medication Instructions:  Your physician recommends that you continue on your current medications as directed. Please refer to the Current Medication list given to you today.  *If you need a refill on your cardiac medications before your next appointment, please call your pharmacy*   Lab Work: None ordered If you have labs (blood work) drawn today and your tests are completely normal, you will receive your results only by: Marland Kitchen MyChart Message (if you have MyChart) OR . A paper copy in the mail If you have any lab test that is abnormal or we need to change your treatment, we will call you to review the results.   Testing/Procedures: Your physician has requested that you have a lexiscan myoview. For further information please visit [email or web address]. Please follow instruction sheet, as given.     Follow-Up: At [facility], you and your health needs are our priority.  As part of our continuing mission to provide you with exceptional heart care, we have created designated Provider Care Teams.  These Care Teams include your primary Cardiologist (physician) and Advanced Practice Providers (APPs -  Physician Assistants and Nurse Practitioners) who all work together to provide you with the care you need, when you need it.  We recommend signing up for the patient portal called "MyChart".  Sign up information is provided on this After Visit Summary.  MyChart is used to connect with patients for Virtual Visits (Telemedicine).  Patients are able to view lab/test results, encounter notes, upcoming appointments, etc.  Non-urgent messages can be sent to your provider as well.   To learn more about what you can do with MyChart, go to [email or web address].    Your next appointment:   Your physician wants you to follow-up in: 1 year You will receive a reminder letter in the mail two months in advance. If you don't receive a letter, please call our office to schedule the follow-up appointment.   The  format for your next appointment:   In Person  Provider:   You may see Kathlyn Sacramento, MD or one of the following Advanced Practice Providers on your designated Care Team:    Murray Hodgkins, NP  Christell Faith, PA-C  Marrianne Mood, PA-C  Cadence [place], [place]  Laurann Montana, NP    Other Instructions  [place]  Your caregiver has ordered a Stress Test with nuclear imaging. The purpose of this test is to evaluate the blood supply to your heart muscle. This procedure is referred to as a "Non-Invasive Stress Test." This is because other than having an IV started in your vein, nothing is inserted or "invades" your body. Cardiac stress tests are done to find areas of poor blood flow to the heart by determining the extent of coronary artery disease (CAD). Some patients exercise on a treadmill, which naturally increases the blood flow to your heart, while others who are  unable to walk on a treadmill due to physical limitations have a pharmacologic/chemical stress agent called Lexiscan . This medicine will mimic walking on a treadmill by temporarily increasing your coronary blood flow.   Please note: these test may take anywhere between 2-4 hours to complete  PLEASE REPORT TO [place] AT THE FIRST DESK WILL DIRECT YOU WHERE TO GO  Date of Procedure:_____________________________________  Arrival Time for Procedure:______________________________  Instructions regarding medication:   __X__ : Hold diabetes medication morning of procedure  __X__:  Hold betablocker(Carvedilol) night before procedure and morning of procedure  __X__:  Hold other  medications as follows: Lasix the morning of the test. Ok to take after  PLEASE NOTIFY THE OFFICE AT LEAST 24 HOURS IN ADVANCE IF YOU ARE UNABLE TO [place].  [phone] AND  PLEASE NOTIFY NUCLEAR MEDICINE AT [facility] AT LEAST 24 HOURS IN ADVANCE IF YOU ARE UNABLE TO KEEP YOUR APPOINTMENT.  [phone]  How to prepare for your Myoview test:  1. Do not eat or drink after midnight 2. No caffeine for 24 hours prior to test 3. No smoking 24 hours prior to test. 4. Your medication may be taken with water.  If your doctor stopped a medication because of this test, do not take that medication. 5. Please wear a short sleeve shirt. 6. No cologne or lotion. 7. Wear comfortable walking shoes. No heels!

## 2020-12-05 DIAGNOSIS — Z992 Dependence on renal dialysis: Secondary | ICD-10-CM | POA: Diagnosis not present

## 2020-12-05 DIAGNOSIS — Z005 Encounter for examination of potential donor of organ and tissue: Secondary | ICD-10-CM | POA: Diagnosis not present

## 2020-12-05 DIAGNOSIS — N186 End stage renal disease: Secondary | ICD-10-CM | POA: Diagnosis not present

## 2020-12-06 DIAGNOSIS — Z992 Dependence on renal dialysis: Secondary | ICD-10-CM | POA: Diagnosis not present

## 2020-12-06 DIAGNOSIS — N186 End stage renal disease: Secondary | ICD-10-CM | POA: Diagnosis not present

## 2020-12-07 DIAGNOSIS — N186 End stage renal disease: Secondary | ICD-10-CM | POA: Diagnosis not present

## 2020-12-07 DIAGNOSIS — Z992 Dependence on renal dialysis: Secondary | ICD-10-CM | POA: Diagnosis not present

## 2020-12-08 ENCOUNTER — Other Ambulatory Visit: Payer: Self-pay

## 2020-12-08 ENCOUNTER — Encounter
Admission: RE | Admit: 2020-12-08 | Discharge: 2020-12-08 | Disposition: A | Discharge status: Home / Self Care | Payer: 59 | Source: Ambulatory Visit | Attending: Cardiovascular Disease | Admitting: Cardiovascular Disease

## 2020-12-08 DIAGNOSIS — Z01810 Encounter for preprocedural cardiovascular examination: Secondary | ICD-10-CM | POA: Diagnosis not present

## 2020-12-08 DIAGNOSIS — N186 End stage renal disease: Secondary | ICD-10-CM | POA: Diagnosis not present

## 2020-12-08 DIAGNOSIS — E113513 Type 2 diabetes mellitus with proliferative diabetic retinopathy with macular edema, bilateral: Secondary | ICD-10-CM | POA: Diagnosis not present

## 2020-12-08 DIAGNOSIS — Z992 Dependence on renal dialysis: Secondary | ICD-10-CM | POA: Diagnosis not present

## 2020-12-08 LAB — NM MYOCAR MULTI W/SPECT W/WALL MOTION / EF
LV dias vol: 186 mL (ref 62–150)
LV sys vol: 78 mL
Peak HR: 82 {beats}/min
Percent HR: 47 %
Rest HR: 75 {beats}/min
SDS: 1
SRS: 13
SSS: 10
TID: 0.87

## 2020-12-08 MED ORDER — REGADENOSON 0.4 MG/5ML IV SOLN
0.4000 mg | Freq: Once | INTRAVENOUS | Status: AC
  Administered 2020-12-08: 0.4 mg via INTRAVENOUS

## 2020-12-08 MED ORDER — TECHNETIUM TC 99M TETROFOSMIN IV KIT
10.0000 | PACK | Freq: Once | INTRAVENOUS | Status: AC | PRN
  Administered 2020-12-08: 9.712 via INTRAVENOUS

## 2020-12-08 MED ORDER — TECHNETIUM TC 99M TETROFOSMIN IV KIT
32.8300 | PACK | Freq: Once | INTRAVENOUS | Status: AC | PRN
  Administered 2020-12-08: 32.83 via INTRAVENOUS

## 2020-12-09 DIAGNOSIS — Z992 Dependence on renal dialysis: Secondary | ICD-10-CM | POA: Diagnosis not present

## 2020-12-09 DIAGNOSIS — N186 End stage renal disease: Secondary | ICD-10-CM | POA: Diagnosis not present

## 2020-12-10 DIAGNOSIS — Z992 Dependence on renal dialysis: Secondary | ICD-10-CM | POA: Diagnosis not present

## 2020-12-10 DIAGNOSIS — N186 End stage renal disease: Secondary | ICD-10-CM | POA: Diagnosis not present

## 2020-12-11 DIAGNOSIS — Z992 Dependence on renal dialysis: Secondary | ICD-10-CM | POA: Diagnosis not present

## 2020-12-11 DIAGNOSIS — N186 End stage renal disease: Secondary | ICD-10-CM | POA: Diagnosis not present

## 2020-12-12 DIAGNOSIS — N186 End stage renal disease: Secondary | ICD-10-CM | POA: Diagnosis not present

## 2020-12-12 DIAGNOSIS — Z992 Dependence on renal dialysis: Secondary | ICD-10-CM | POA: Diagnosis not present

## 2020-12-13 DIAGNOSIS — N186 End stage renal disease: Secondary | ICD-10-CM | POA: Diagnosis not present

## 2020-12-13 DIAGNOSIS — Z992 Dependence on renal dialysis: Secondary | ICD-10-CM | POA: Diagnosis not present

## 2020-12-14 DIAGNOSIS — Z992 Dependence on renal dialysis: Secondary | ICD-10-CM | POA: Diagnosis not present

## 2020-12-14 DIAGNOSIS — N186 End stage renal disease: Secondary | ICD-10-CM | POA: Diagnosis not present

## 2020-12-15 DIAGNOSIS — N186 End stage renal disease: Secondary | ICD-10-CM | POA: Diagnosis not present

## 2020-12-15 DIAGNOSIS — Z992 Dependence on renal dialysis: Secondary | ICD-10-CM | POA: Diagnosis not present

## 2020-12-16 ENCOUNTER — Ambulatory Visit: Payer: 59 | Admitting: Endocrinology

## 2020-12-16 DIAGNOSIS — Z992 Dependence on renal dialysis: Secondary | ICD-10-CM | POA: Diagnosis not present

## 2020-12-16 DIAGNOSIS — N186 End stage renal disease: Secondary | ICD-10-CM | POA: Diagnosis not present

## 2020-12-17 DIAGNOSIS — N186 End stage renal disease: Secondary | ICD-10-CM | POA: Diagnosis not present

## 2020-12-17 DIAGNOSIS — Z992 Dependence on renal dialysis: Secondary | ICD-10-CM | POA: Diagnosis not present

## 2020-12-18 DIAGNOSIS — Z992 Dependence on renal dialysis: Secondary | ICD-10-CM | POA: Diagnosis not present

## 2020-12-18 DIAGNOSIS — N186 End stage renal disease: Secondary | ICD-10-CM | POA: Diagnosis not present

## 2020-12-19 DIAGNOSIS — N186 End stage renal disease: Secondary | ICD-10-CM | POA: Diagnosis not present

## 2020-12-19 DIAGNOSIS — Z992 Dependence on renal dialysis: Secondary | ICD-10-CM | POA: Diagnosis not present

## 2020-12-20 DIAGNOSIS — Z992 Dependence on renal dialysis: Secondary | ICD-10-CM | POA: Diagnosis not present

## 2020-12-20 DIAGNOSIS — N186 End stage renal disease: Secondary | ICD-10-CM | POA: Diagnosis not present

## 2020-12-21 DIAGNOSIS — Z992 Dependence on renal dialysis: Secondary | ICD-10-CM | POA: Diagnosis not present

## 2020-12-21 DIAGNOSIS — N186 End stage renal disease: Secondary | ICD-10-CM | POA: Diagnosis not present

## 2020-12-22 ENCOUNTER — Encounter: Payer: Self-pay | Admitting: Endocrinology

## 2020-12-22 ENCOUNTER — Ambulatory Visit: Payer: 59 | Admitting: Endocrinology

## 2020-12-22 ENCOUNTER — Other Ambulatory Visit: Payer: Self-pay

## 2020-12-22 VITALS — BP 140/78 | HR 73 | Resp 20 | Ht 73.0 in | Wt 257.6 lb

## 2020-12-22 DIAGNOSIS — N186 End stage renal disease: Secondary | ICD-10-CM | POA: Diagnosis not present

## 2020-12-22 DIAGNOSIS — E1065 Type 1 diabetes mellitus with hyperglycemia: Secondary | ICD-10-CM

## 2020-12-22 DIAGNOSIS — E7800 Pure hypercholesterolemia, unspecified: Secondary | ICD-10-CM | POA: Diagnosis not present

## 2020-12-22 DIAGNOSIS — Z992 Dependence on renal dialysis: Secondary | ICD-10-CM | POA: Diagnosis not present

## 2020-12-22 NOTE — Progress Notes (Signed)
Patient ID: [name], male   DOB: 03-09-1971, 50 y.o.   MRN: [id number]           Reason for Appointment : for Type 1 Diabetes  History of Present Illness   Referring HCP: Dr. Holley Raring         Diagnosis: Type 1 diabetes mellitus, date of diagnosis:  1997        Previous history:  He has been variously on insulin injections and insulin pumps since his diagnosis He has been on insulin pump on and off for the last few years with fair control  Recent history:   INSULIN pump settings with the MINIMED 670 G pump:  Basal rate #1: 12 AM = 2.0, 3 AM = 1.0 from 6 AM = 1.5 and 8 PM = 2.0 Basal rate #2: 12 AM = 1.8, 8 AM = 1.4 and 6 PM = 1.8  Carbohydrate coverage 1: 7 at breakfast and suppertime and 1: 8 at lunch  Sensitivity 1: 30 except 1: 20 between 8 PM-8 AM Current target 120 and active insulin 3 hours  A1c on 10/14/2020 was 13.3  Current management, blood sugar patterns and problems identified:    Since his basal rates were increased his blood sugars are better  However could not assess blood sugar data from his freestyle libre which was not connected  He is following 2 different new basal settings depending on the concentration of the peritoneal dialysis at night  However his blood sugars are overall still fluctuating considerably  On his fingersticks he generally has high readings midday and early afternoon and also frequently in the evenings  HYPOGLYCEMIA has occurred occasionally overnight or midmorning only  Generally blood sugars are higher midday and frequently around suppertime and until at least 10 PM  He says that blood sugars have been high the last 3 to 4 days because of being on vacation and eating out  He does not do any formal exercise but he is walking during the day at work up to 4-5 miles total  Blood sugar data from pump download:   PRE-MEAL Fasting Lunch Dinner Bedtime Overall  Glucose range:  81-339  153-356  82-385  139-400    Mean/median:  192    229 219+/-25   Data from freestyle LIBRE: Recent time in range 51%  Previous data:  PRE-MEAL Fasting Lunch Dinner Bedtime Overall  Glucose range:   140-200+     Mean/median:  356   247   302+/-146        Self-care: Typical breakfast will be toast, eggs and bacon Usually lunch will be chicken, pasta and green beans Typical dinner: Steak asparagus and corn Snacks peanut butter crackers and chips   Mealtimes are: Dinner: 6-9 PM                Dietician consultation: Most recent: none.         CDE consultation: Years ago  Diabetes labs:  Lab Results  Component Value Date   HGBA1C 13.3 10/14/2020   HGBA1C 10.5 (H) 01/23/2020   HGBA1C 10.2 (H) 09/27/2019   Lab Results  Component Value Date   LDLCALC 190 (H) 08/29/2018   CREATININE 4.30 (H) 01/28/2020    No results found for: MICRALBCREAT   Allergies as of 12/22/2020      Reactions   Sulfa Antibiotics Anaphylaxis, Swelling   Pt is not allergic to iodine, has had iodine in the past w/o premeds and w/ no problems  Oxycodone Nausea And Vomiting   Oxycontin [oxycodone Hcl] Nausea And Vomiting   Penicillins Other (See Comments)   Possible reaction many years ago per patient Other reaction(s): Other (See Comments), Unknown Possible reaction many years ago per patient   Prednisone Other (See Comments)   Patient is diabetic, runs sugar up Other reaction(s): Other (See Comments), Other (See Comments) Patient is diabetic, runs sugar up Patient is diabetic, runs sugar up Patient is diabetic, runs sugar up    Shellfish-derived Products    Pt is not allergic to iodine, has had iodine in the past w/o premeds and w/ no problems      Medication List       Accurate as of December 22, 2020  2:49 PM. If you have any questions, ask your nurse or doctor.        acetaminophen 500 MG tablet Commonly known as: TYLENOL Take 500-1,000 mg by mouth every 6 (six) hours as needed for mild pain or fever.    amLODipine 10 MG tablet Commonly known as: NORVASC Take 1 tablet (10 mg total) by mouth daily.   atorvastatin 80 MG tablet Commonly known as: LIPITOR Take 80 mg by mouth every evening. What changed: Another medication with the same name was removed. Continue taking this medication, and follow the directions you see here. Changed by: Elayne Snare, MD   calcitRIOL 0.25 MCG capsule Commonly known as: ROCALTROL Take by mouth.   carvedilol 25 MG tablet Commonly known as: COREG Take 25 mg by mouth 2 (two) times daily with a meal.   cinacalcet 30 MG tablet Commonly known as: SENSIPAR Take 30 mg by mouth daily.   cloNIDine 0.1 MG tablet Commonly known as: CATAPRES   FreeStyle Libre 2 Sensor Misc 2 Devices by Does not apply route every 14 (fourteen) days.   furosemide 80 MG tablet Commonly known as: LASIX Take 80 mg by mouth daily as needed for fluid or edema.   HumaLOG 100 UNIT/ML injection Generic drug: insulin lispro Inject 100 Units into the skin daily.   losartan 100 MG tablet Commonly known as: COZAAR Take 100 mg by mouth daily.       Allergies:  Allergies  Allergen Reactions  . Sulfa Antibiotics Anaphylaxis and Swelling    Pt is not allergic to iodine, has had iodine in the past w/o premeds and w/ no problems  . Oxycodone Nausea And Vomiting  . Oxycontin [Oxycodone Hcl] Nausea And Vomiting  . Penicillins Other (See Comments)    Possible reaction many years ago per patient Other reaction(s): Other (See Comments), Unknown Possible reaction many years ago per patient  . Prednisone Other (See Comments)    Patient is diabetic, runs sugar up Other reaction(s): Other (See Comments), Other (See Comments) Patient is diabetic, runs sugar up Patient is diabetic, runs sugar up Patient is diabetic, runs sugar up   . Shellfish-Derived Products     Pt is not allergic to iodine, has had iodine in the past w/o premeds and w/ no problems    Past Medical History:   Diagnosis Date  . ESRD (end stage renal disease) ([place])   . Gastroparesis   . Herniated cervical disc   . HTN (hypertension)   . Hypercholesteremia   . Morbid obesity ([place])   . S/P cardiac cath    a. 10-15 yrs ago at [facility] due to tachycardia, reportedly normal. b. Normal ETT 03/2012.  Marland Kitchen Sinus tachycardia    a. 24-hr Holter 03/2012 - SR, occ PVCs, no  VT, avg HR 96bpm.  . TIA (transient ischemic attack)   . Type 1 diabetes mellitus ([place])    a. With insulin pump.    Past Surgical History:  Procedure Laterality Date  . BIOPSY  10/03/2019   Procedure: BIOPSY;  Surgeon: Mauri Pole, MD;  Location: [facility];  Service: Endoscopy;;  . cervical neck fusion    . ESOPHAGOGASTRODUODENOSCOPY (EGD) WITH PROPOFOL N/A 10/03/2019   Procedure: ESOPHAGOGASTRODUODENOSCOPY (EGD) WITH PROPOFOL;  Surgeon: Mauri Pole, MD;  Location: [facility];  Service: Endoscopy;  Laterality: N/A;  . HERNIA REPAIR     bilateral  . LEFT HEART CATHETERIZATION WITH CORONARY ANGIOGRAM N/A 12/18/2013   Procedure: LEFT HEART CATHETERIZATION WITH CORONARY ANGIOGRAM;  Surgeon: [name] M Martinique, MD;  Location[facility] CATH LAB;  Service: Cardiovascular;  Laterality: N/A;    Family History  Problem Relation Age of Onset  . Hypertension Other   . Coronary artery disease Other        Mother's side - both her side's grandparents died of heart disease (MIs)  . Diabetes Other   . Stroke Other        Paternal grandfather (66)  . Prostate cancer Neg Hx   . Bladder Cancer Neg Hx   . Kidney cancer Neg Hx     Social History:  reports that he has never smoked. He has never used smokeless tobacco. He reports that he does not drink alcohol and does not use drugs.      Review of Systems      Lipids: Has been treated with 80 mg atorvastatin, no recent labs available  Lab Results  Component Value Date   CHOL 257 (H) 08/29/2018   HDL 43 08/29/2018   LDLCALC 190 (H) 08/29/2018   TRIG 119 08/29/2018   CHOLHDL 6.0  08/29/2018    Last dilated eye exam was in 96/29  DIABETES COMPLICATIONS: Nephropathy, diabetic foot ulcers    Physical Examination:  BP 140/78 (BP Location: Right Arm, Patient Position: Sitting, Cuff Size: Large)   Pulse 73   Resp 20   Ht 6\' 1"  (1.854 m)   Wt 257 lb 9.6 oz (116.8 kg)   SpO2 98%   BMI 33.99 kg/m    ASSESSMENT:  Diabetes type 1, poorly controlled  A1c last 13.3, previously about 8%  Problems identified:  He is using the freestyle libre which is helping him with somewhat better control and he is likely bolusing more frequently to current high readings  However difficult to identify consistent patterns  He still has not contacted [employer] to upgrade to the 770 pump  Since he is using variable concentration of peritoneal dialysate fluid at night he has been using 2 different basal rates but not clear if this needs to be adjusted further since overnight blood sugar patterns are highly variable  Appears to be needing higher basal rates midday and evenings  Currently unable to assess postprandial control especially with not always entering his carbohydrates at mealtimes  Also appears to be occasionally missing mealtime boluses  History of hyperlipidemia: Needs follow-up labs  PLAN:   Needs to bolus consistently for all meals before starting to eat To check on the [employer] 770 pump upgrade and discussed benefits of this  New basal settings as follows  BASAL profile 1 for the lower glucose concentration dialysate:  12 AM-8 AM = 2.0.  3 AM-6; 0.9, 6 AM-10 AM = 1.4, 10 AM-8 PM = 1.8 .  8 PM-12 AM = 2.0  BASAL profile 2 for HIGHER glucose concentration 12 AM-8 AM = 1.7.  8 AM- 10 AM = 1.4, 10 AM-8 PM = 1.8 and 8 PM-12 AM = 1.8  He will try to collect his freestyle libre data to our online account to enable better assessment of his CGM data  Total visit time including counseling is 30 minutes  There are no Patient Instructions on file for this  visit.    Elayne Snare 12/22/2020, 2:49 PM   Copy of consultation sent to referring physician   Note: This note was prepared with Dragon voice recognition system technology. Any transcriptional errors that result from this process are unintentional.

## 2020-12-23 ENCOUNTER — Other Ambulatory Visit: Payer: Self-pay | Admitting: Nephrology

## 2020-12-23 DIAGNOSIS — N186 End stage renal disease: Secondary | ICD-10-CM | POA: Diagnosis not present

## 2020-12-23 DIAGNOSIS — Z992 Dependence on renal dialysis: Secondary | ICD-10-CM | POA: Diagnosis not present

## 2020-12-24 DIAGNOSIS — Z992 Dependence on renal dialysis: Secondary | ICD-10-CM | POA: Diagnosis not present

## 2020-12-24 DIAGNOSIS — N186 End stage renal disease: Secondary | ICD-10-CM | POA: Diagnosis not present

## 2020-12-25 ENCOUNTER — Other Ambulatory Visit: Payer: Self-pay | Admitting: Endocrinology

## 2020-12-25 ENCOUNTER — Telehealth: Payer: Self-pay | Admitting: Endocrinology

## 2020-12-25 ENCOUNTER — Other Ambulatory Visit: Payer: Self-pay

## 2020-12-25 DIAGNOSIS — N186 End stage renal disease: Secondary | ICD-10-CM | POA: Diagnosis not present

## 2020-12-25 DIAGNOSIS — Z992 Dependence on renal dialysis: Secondary | ICD-10-CM | POA: Diagnosis not present

## 2020-12-25 MED ORDER — HUMALOG 100 UNIT/ML ~~LOC~~ SOLN: 100.0000 [IU] | Freq: Every day | SUBCUTANEOUS | 11 refills | Status: DC

## 2020-12-25 NOTE — Telephone Encounter (Signed)
Rx sent 

## 2020-12-25 NOTE — Telephone Encounter (Signed)
MEDICATION: humalog  PHARMACY:   [facility], [address] Phone:  [phone]  Fax:  [phone]      HAS THE PATIENT CONTACTED [place]?  yes  IS THIS A 90 DAY SUPPLY : yes  IS PATIENT OUT OF MEDICATION: yes  IF NOT; HOW MUCH IS LEFT:   LAST APPOINTMENT DATE: @3 /21/2022  NEXT APPOINTMENT DATE:@5 /07/2021  DO WE HAVE YOUR PERMISSION TO LEAVE A DETAILED MESSAGE?:  OTHER COMMENTS:    **Let patient know to contact pharmacy at the end of the day to make sure medication is ready. **  ** Please notify patient to allow 48-72 hours to process**  **Encourage patient to contact the pharmacy for refills or they can request refills through [facility]**

## 2020-12-26 ENCOUNTER — Other Ambulatory Visit: Payer: Self-pay

## 2020-12-26 DIAGNOSIS — Z1211 Encounter for screening for malignant neoplasm of colon: Secondary | ICD-10-CM

## 2020-12-26 DIAGNOSIS — N186 End stage renal disease: Secondary | ICD-10-CM | POA: Diagnosis not present

## 2020-12-26 DIAGNOSIS — Z992 Dependence on renal dialysis: Secondary | ICD-10-CM | POA: Diagnosis not present

## 2020-12-26 MED ORDER — NA SULFATE-K SULFATE-MG SULF 17.5-3.13-1.6 GM/177ML PO SOLN: 1.0000 | Freq: Once | ORAL | 0 refills | Status: DC

## 2020-12-27 DIAGNOSIS — Z992 Dependence on renal dialysis: Secondary | ICD-10-CM | POA: Diagnosis not present

## 2020-12-27 DIAGNOSIS — N186 End stage renal disease: Secondary | ICD-10-CM | POA: Diagnosis not present

## 2020-12-28 DIAGNOSIS — Z992 Dependence on renal dialysis: Secondary | ICD-10-CM | POA: Diagnosis not present

## 2020-12-28 DIAGNOSIS — N186 End stage renal disease: Secondary | ICD-10-CM | POA: Diagnosis not present

## 2020-12-29 DIAGNOSIS — N186 End stage renal disease: Secondary | ICD-10-CM | POA: Diagnosis not present

## 2020-12-29 DIAGNOSIS — Z992 Dependence on renal dialysis: Secondary | ICD-10-CM | POA: Diagnosis not present

## 2020-12-30 ENCOUNTER — Encounter: Payer: Self-pay | Admitting: Gastroenterology

## 2020-12-30 ENCOUNTER — Other Ambulatory Visit: Payer: Self-pay

## 2020-12-30 DIAGNOSIS — N186 End stage renal disease: Secondary | ICD-10-CM | POA: Diagnosis not present

## 2020-12-30 DIAGNOSIS — Z992 Dependence on renal dialysis: Secondary | ICD-10-CM | POA: Diagnosis not present

## 2020-12-31 ENCOUNTER — Other Ambulatory Visit
Admission: RE | Admit: 2020-12-31 | Discharge: 2020-12-31 | Disposition: A | Discharge status: Home / Self Care | Payer: 59 | Source: Ambulatory Visit | Attending: Gastroenterology | Admitting: Gastroenterology

## 2020-12-31 ENCOUNTER — Other Ambulatory Visit: Payer: Self-pay | Admitting: Nephrology

## 2020-12-31 DIAGNOSIS — Z01812 Encounter for preprocedural laboratory examination: Secondary | ICD-10-CM | POA: Diagnosis not present

## 2020-12-31 DIAGNOSIS — Z992 Dependence on renal dialysis: Secondary | ICD-10-CM | POA: Diagnosis not present

## 2020-12-31 DIAGNOSIS — Z20822 Contact with and (suspected) exposure to covid-19: Secondary | ICD-10-CM | POA: Diagnosis not present

## 2020-12-31 DIAGNOSIS — N186 End stage renal disease: Secondary | ICD-10-CM | POA: Diagnosis not present

## 2020-12-31 LAB — SARS CORONAVIRUS 2 (TAT 6-24 HRS): SARS Coronavirus 2: NEGATIVE

## 2021-01-01 DIAGNOSIS — Z992 Dependence on renal dialysis: Secondary | ICD-10-CM | POA: Diagnosis not present

## 2021-01-01 DIAGNOSIS — N186 End stage renal disease: Secondary | ICD-10-CM | POA: Diagnosis not present

## 2021-01-01 NOTE — Discharge Instructions (Signed)

## 2021-01-02 ENCOUNTER — Ambulatory Visit: Payer: 59 | Admitting: Anesthesiology

## 2021-01-02 ENCOUNTER — Other Ambulatory Visit
Admission: RE | Admit: 2021-01-02 | Discharge: 2021-01-02 | Disposition: A | Discharge status: Home / Self Care | Payer: 59 | Source: Home / Self Care | Attending: Anesthesiology | Admitting: Anesthesiology

## 2021-01-02 ENCOUNTER — Encounter
Admission: RE | Disposition: A | Discharge status: Home / Self Care | Payer: Self-pay | Source: Ambulatory Visit | Attending: Gastroenterology

## 2021-01-02 ENCOUNTER — Other Ambulatory Visit: Payer: Self-pay

## 2021-01-02 ENCOUNTER — Ambulatory Visit
Admission: RE | Admit: 2021-01-02 | Discharge: 2021-01-02 | Disposition: A | Discharge status: Home / Self Care | Payer: 59 | Source: Ambulatory Visit | Attending: Gastroenterology | Admitting: Gastroenterology

## 2021-01-02 ENCOUNTER — Encounter: Payer: Self-pay | Admitting: Gastroenterology

## 2021-01-02 DIAGNOSIS — Z992 Dependence on renal dialysis: Secondary | ICD-10-CM | POA: Insufficient documentation

## 2021-01-02 DIAGNOSIS — I120 Hypertensive chronic kidney disease with stage 5 chronic kidney disease or end stage renal disease: Secondary | ICD-10-CM | POA: Insufficient documentation

## 2021-01-02 DIAGNOSIS — Z01810 Encounter for preprocedural cardiovascular examination: Secondary | ICD-10-CM

## 2021-01-02 DIAGNOSIS — Z8673 Personal history of transient ischemic attack (TIA), and cerebral infarction without residual deficits: Secondary | ICD-10-CM | POA: Diagnosis not present

## 2021-01-02 DIAGNOSIS — K640 First degree hemorrhoids: Secondary | ICD-10-CM | POA: Insufficient documentation

## 2021-01-02 DIAGNOSIS — Z7952 Long term (current) use of systemic steroids: Secondary | ICD-10-CM | POA: Diagnosis not present

## 2021-01-02 DIAGNOSIS — Z833 Family history of diabetes mellitus: Secondary | ICD-10-CM | POA: Diagnosis not present

## 2021-01-02 DIAGNOSIS — Z885 Allergy status to narcotic agent status: Secondary | ICD-10-CM | POA: Diagnosis not present

## 2021-01-02 DIAGNOSIS — Z880 Allergy status to penicillin: Secondary | ICD-10-CM | POA: Insufficient documentation

## 2021-01-02 DIAGNOSIS — Z1211 Encounter for screening for malignant neoplasm of colon: Secondary | ICD-10-CM | POA: Insufficient documentation

## 2021-01-02 DIAGNOSIS — D120 Benign neoplasm of cecum: Secondary | ICD-10-CM | POA: Diagnosis not present

## 2021-01-02 DIAGNOSIS — Z79899 Other long term (current) drug therapy: Secondary | ICD-10-CM | POA: Diagnosis not present

## 2021-01-02 DIAGNOSIS — Z91013 Allergy to seafood: Secondary | ICD-10-CM | POA: Insufficient documentation

## 2021-01-02 DIAGNOSIS — Z823 Family history of stroke: Secondary | ICD-10-CM | POA: Diagnosis not present

## 2021-01-02 DIAGNOSIS — Z01812 Encounter for preprocedural laboratory examination: Secondary | ICD-10-CM | POA: Insufficient documentation

## 2021-01-02 DIAGNOSIS — N186 End stage renal disease: Secondary | ICD-10-CM | POA: Diagnosis not present

## 2021-01-02 DIAGNOSIS — D125 Benign neoplasm of sigmoid colon: Secondary | ICD-10-CM | POA: Diagnosis not present

## 2021-01-02 DIAGNOSIS — Z8249 Family history of ischemic heart disease and other diseases of the circulatory system: Secondary | ICD-10-CM | POA: Diagnosis not present

## 2021-01-02 DIAGNOSIS — Z888 Allergy status to other drugs, medicaments and biological substances status: Secondary | ICD-10-CM | POA: Diagnosis not present

## 2021-01-02 DIAGNOSIS — E1022 Type 1 diabetes mellitus with diabetic chronic kidney disease: Secondary | ICD-10-CM | POA: Insufficient documentation

## 2021-01-02 DIAGNOSIS — Z794 Long term (current) use of insulin: Secondary | ICD-10-CM | POA: Diagnosis not present

## 2021-01-02 DIAGNOSIS — K635 Polyp of colon: Secondary | ICD-10-CM | POA: Diagnosis not present

## 2021-01-02 DIAGNOSIS — Z9641 Presence of insulin pump (external) (internal): Secondary | ICD-10-CM | POA: Insufficient documentation

## 2021-01-02 DIAGNOSIS — Z882 Allergy status to sulfonamides status: Secondary | ICD-10-CM | POA: Diagnosis not present

## 2021-01-02 HISTORY — DX: Other specified postprocedural states: Z98.890

## 2021-01-02 HISTORY — DX: Other specified postprocedural states: R11.2

## 2021-01-02 HISTORY — PX: COLONOSCOPY WITH PROPOFOL: SHX5780

## 2021-01-02 HISTORY — PX: POLYPECTOMY: SHX5525

## 2021-01-02 HISTORY — DX: Dependence on renal dialysis: Z99.2

## 2021-01-02 LAB — GLUCOSE, CAPILLARY
Glucose-Capillary: 268 mg/dL — ABNORMAL HIGH (ref 70–99)
Glucose-Capillary: 267 mg/dL — ABNORMAL HIGH (ref 70–99)

## 2021-01-02 LAB — POTASSIUM: Potassium: 4.8 mmol/L (ref 3.5–5.1)

## 2021-01-02 SURGERY — COLONOSCOPY WITH PROPOFOL
Anesthesia: General

## 2021-01-02 MED ORDER — PROPOFOL 10 MG/ML IV BOLUS
INTRAVENOUS | Status: DC | PRN
  Administered 2021-01-02 (×3): 30 mg via INTRAVENOUS
  Administered 2021-01-02: 20 mg via INTRAVENOUS
  Administered 2021-01-02: 30 mg via INTRAVENOUS
  Administered 2021-01-02: 20 mg via INTRAVENOUS
  Administered 2021-01-02 (×2): 40 mg via INTRAVENOUS
  Administered 2021-01-02: 30 mg via INTRAVENOUS
  Administered 2021-01-02: 40 mg via INTRAVENOUS
  Administered 2021-01-02: 30 mg via INTRAVENOUS
  Administered 2021-01-02: 20 mg via INTRAVENOUS
  Administered 2021-01-02: 70 mg via INTRAVENOUS

## 2021-01-02 MED ORDER — PROMETHAZINE HCL 25 MG/ML IJ SOLN: 6.2500 mg | INTRAMUSCULAR | Status: DC | PRN

## 2021-01-02 MED ORDER — LIDOCAINE HCL (CARDIAC) PF 100 MG/5ML IV SOSY
PREFILLED_SYRINGE | INTRAVENOUS | Status: DC | PRN
  Administered 2021-01-02 (×2): 50 mg via INTRAVENOUS

## 2021-01-02 MED ORDER — OXYCODONE HCL 5 MG/5ML PO SOLN: 5.0000 mg | Freq: Once | ORAL | Status: DC | PRN

## 2021-01-02 MED ORDER — OXYCODONE HCL 5 MG PO TABS: 5.0000 mg | ORAL_TABLET | Freq: Once | ORAL | Status: DC | PRN

## 2021-01-02 MED ORDER — MEPERIDINE HCL 25 MG/ML IJ SOLN: 6.2500 mg | INTRAMUSCULAR | Status: DC | PRN

## 2021-01-02 MED ORDER — FENTANYL CITRATE (PF) 100 MCG/2ML IJ SOLN: 25.0000 ug | INTRAMUSCULAR | Status: DC | PRN

## 2021-01-02 MED ORDER — SODIUM CHLORIDE 0.9 % IV SOLN: INTRAVENOUS | Status: DC

## 2021-01-02 MED ORDER — STERILE WATER FOR IRRIGATION IR SOLN
Status: DC | PRN
  Administered 2021-01-02: 150 mL

## 2021-01-02 MED ORDER — LACTATED RINGERS IV SOLN: INTRAVENOUS | Status: DC

## 2021-01-02 SURGICAL SUPPLY — 22 items
CLIP HMST 235XBRD CATH ROT (MISCELLANEOUS) IMPLANT
CLIP RESOLUTION 360 11X235 (MISCELLANEOUS)
ELECT REM PT RETURN 9FT ADLT (ELECTROSURGICAL)
ELECTRODE REM PT RTRN 9FT ADLT (ELECTROSURGICAL) IMPLANT
FORCEPS BIOP RAD 4 LRG CAP 4 (CUTTING FORCEPS) ×3 IMPLANT
GOWN CVR UNV OPN BCK APRN NK (MISCELLANEOUS) ×4 IMPLANT
GOWN ISOL THUMB LOOP REG UNIV (MISCELLANEOUS) ×6
INJECTOR VARIJECT VIN23 (MISCELLANEOUS) IMPLANT
KIT DEFENDO VALVE AND CONN (KITS) IMPLANT
KIT PRC NS LF DISP ENDO (KITS) ×2 IMPLANT
KIT PROCEDURE OLYMPUS (KITS) ×3
MANIFOLD NEPTUNE II (INSTRUMENTS) ×3 IMPLANT
MARKER SPOT ENDO TATTOO 5ML (MISCELLANEOUS) IMPLANT
PROBE APC STR FIRE (PROBE) IMPLANT
RETRIEVER NET ROTH 2.5X230 LF (MISCELLANEOUS) IMPLANT
SNARE COLD EXACTO (MISCELLANEOUS) ×3 IMPLANT
SNARE SHORT THROW 13M SML OVAL (MISCELLANEOUS) IMPLANT
SNARE SNG USE RND 15MM (INSTRUMENTS) IMPLANT
SPOT EX ENDOSCOPIC TATTOO (MISCELLANEOUS)
TRAP ETRAP POLY (MISCELLANEOUS) ×3 IMPLANT
VARIJECT INJECTOR VIN23 (MISCELLANEOUS)
WATER STERILE IRR 250ML POUR (IV SOLUTION) ×3 IMPLANT

## 2021-01-02 NOTE — Anesthesia Procedure Notes (Signed)
Performed by: Vanetta Shawl, CRNA Pre-anesthesia Checklist: Patient identified, Emergency Drugs available, Suction available, Patient being monitored and Timeout performed Patient Re-evaluated:Patient Re-evaluated prior to induction Oxygen Delivery Method: Circle system utilized and Nasal cannula

## 2021-01-02 NOTE — Anesthesia Preprocedure Evaluation (Addendum)
Anesthesia Evaluation  Patient identified by MRN, date of birth, ID band Patient awake    Reviewed: Allergy & Precautions, H&P , NPO status , Patient's Chart, lab work & pertinent test results, reviewed documented beta blocker date and time   History of Anesthesia Complications (+) PONV and history of anesthetic complications (PONV)  Airway Mallampati: II  TM Distance: >3 FB Neck ROM: full    Dental no notable dental hx.    Pulmonary shortness of breath,    Pulmonary exam normal breath sounds clear to auscultation       Cardiovascular Exercise Tolerance: Good hypertension, negative cardio ROS   Rhythm:regular Rate:Normal     Neuro/Psych TIAnegative psych ROS   GI/Hepatic negative GI ROS, Neg liver ROS, PUD,   Endo/Other  diabetes (insulin pump), Poorly ControlledHypothyroidism   Renal/GU DialysisRenal disease (peritoneal dialysis)  negative genitourinary   Musculoskeletal   Abdominal   Peds  Hematology negative hematology ROS (+)   Anesthesia Other Findings   Reproductive/Obstetrics negative OB ROS                            Anesthesia Physical Anesthesia Plan  ASA: III  Anesthesia Plan: General   Post-op Pain Management:    Induction:   PONV Risk Score and Plan: 3 and TIVA, Propofol infusion and Treatment may vary due to age or medical condition  Airway Management Planned:   Additional Equipment:   Intra-op Plan:   Post-operative Plan:   Informed Consent: I have reviewed the patients History and Physical, chart, labs and discussed the procedure including the risks, benefits and alternatives for the proposed anesthesia with the patient or authorized representative who has indicated his/her understanding and acceptance.     Dental Advisory Given  Plan Discussed with: CRNA  Anesthesia Plan Comments:        Anesthesia Quick Evaluation

## 2021-01-02 NOTE — Op Note (Signed)
[facility] Gastroenterology Patient Name: [name] Procedure Date: 01/02/2021 9:28 AM MRN: [id number] Account #: [id number] Date of Birth: 1971/07/09 Admit Type: Outpatient Age: 50 Room: [facility] OR ROOM 01 Gender: Male Note Status: Finalized Procedure:             Colonoscopy Indications:           Screening for colorectal malignant neoplasm Providers:             [name] MD, MD Referring MD:          [name], MD (Referring MD) Medicines:             Propofol per Anesthesia Complications:         No immediate complications. Procedure:             Pre-Anesthesia Assessment:                        - Prior to the procedure, a History and Physical was                         performed, and patient medications and allergies were                         reviewed. The patient's tolerance of previous                         anesthesia was also reviewed. The risks and benefits                         of the procedure and the sedation options and risks                         were discussed with the patient. All questions were                         answered, and informed consent was obtained. Prior                         Anticoagulants: The patient has taken no previous                         anticoagulant or antiplatelet agents. ASA Grade                         Assessment: II - A patient with mild systemic disease.                         After reviewing the risks and benefits, the patient                         was deemed in satisfactory condition to undergo the                         procedure.                        After obtaining informed consent, the colonoscope was  passed under direct vision. Throughout the procedure,                         the patient's blood pressure, pulse, and oxygen                         saturations were monitored continuously. The was                         introduced through the anus and  advanced to the the                         cecum, identified by appendiceal orifice and ileocecal                         valve. The colonoscopy was performed without                         difficulty. The patient tolerated the procedure well.                         The quality of the bowel preparation was excellent. Findings:      The perianal and digital rectal examinations were normal.      A 4 mm polyp was found in the sigmoid colon. The polyp was pedunculated.       The polyp was removed with a cold snare. Resection and retrieval were       complete.      A 3 mm polyp was found in the cecum. The polyp was sessile. The polyp       was removed with a cold biopsy forceps. Resection and retrieval were       complete.      Non-bleeding internal hemorrhoids were found during retroflexion. The       hemorrhoids were Grade I (internal hemorrhoids that do not prolapse). Impression:            - One 4 mm polyp in the sigmoid colon, removed with a                         cold snare. Resected and retrieved.                        - One 3 mm polyp in the cecum, removed with a cold                         biopsy forceps. Resected and retrieved.                        - Non-bleeding internal hemorrhoids. Recommendation:        - Discharge patient to home.                        - Resume previous diet.                        - Continue present medications.                        - Await pathology results.                        -  Repeat colonoscopy in 7 years if polyp adenoma and                         10 years if hyperplastic Procedure Code(s):     --- Professional ---                        [phone], Colonoscopy, flexible; with removal of                         tumor(s), polyp(s), or other lesion(s) by snare                         technique                        45380, 32, Colonoscopy, flexible; with biopsy, single                         or multiple Diagnosis Code(s):     --- Professional  ---                        Z12.11, Encounter for screening for malignant neoplasm                         of colon                        K63.5, Polyp of colon CPT copyright 2019 American Medical Association. All rights reserved. The codes documented in this report are preliminary and upon coder review may  be revised to meet current compliance requirements. [name] MD, MD 01/02/2021 10:03:40 AM This report has been signed electronically. Number of Addenda: 0 Note Initiated On: 01/02/2021 9:28 AM Scope Withdrawal Time: 0 hours 10 minutes 28 seconds  Total Procedure Duration: 0 hours 16 minutes 30 seconds  Estimated Blood Loss:  Estimated blood loss: none.      [facility]

## 2021-01-02 NOTE — H&P (Addendum)
Matthew Lame, MD [facility] [address] [name], [place] [address] Phone:[phone] Fax : [phone]  Primary Care Physician:  Matthew Hire, MD Primary Gastroenterologist:  Dr. Allen [name]  Pre-Procedure History & Physical: HPI:  [name] is a 50 y.o. male is here for an colonoscopy.   Past Medical History:  Diagnosis Date  . ESRD (end stage renal disease) ([place])   . Gastroparesis   . Herniated cervical disc   . HTN (hypertension)   . Hypercholesteremia   . Morbid obesity ([place])   . Peritoneal dialysis status ([place])   . PONV (postoperative nausea and vomiting)    after peritoneal dialysis catheter was placed  . S/P cardiac cath    a. 10-15 yrs ago at [facility] due to tachycardia, reportedly normal. b. Normal ETT 03/2012.  [name] Sinus tachycardia    a. 24-hr Holter 03/2012 - SR, occ PVCs, no VT, avg HR 96bpm.  . TIA (transient ischemic attack)   . Type 1 diabetes mellitus ([place])    a. With insulin pump.    Past Surgical History:  Procedure Laterality Date  . BIOPSY  10/03/2019   Procedure: BIOPSY;  Surgeon: Matthew Pole, MD;  Location: [facility] ENDOSCOPY;  Service: Endoscopy;;  . cervical neck fusion    . ESOPHAGOGASTRODUODENOSCOPY (EGD) WITH PROPOFOL N/A 10/03/2019   Procedure: ESOPHAGOGASTRODUODENOSCOPY (EGD) WITH PROPOFOL;  Surgeon: Matthew Pole, MD;  Location: [facility] ENDOSCOPY;  Service: Endoscopy;  Laterality: N/A;  . HERNIA REPAIR     bilateral  . LEFT HEART CATHETERIZATION WITH CORONARY ANGIOGRAM N/A 12/18/2013   Procedure: LEFT HEART CATHETERIZATION WITH CORONARY ANGIOGRAM;  Surgeon: Matthew M Martinique, MD;  Location: [facility] CATH LAB;  Service: Cardiovascular;  Laterality: N/A;    Prior to Admission medications   Medication Sig Start Date End Date Taking? Authorizing Provider  acetaminophen (TYLENOL) 500 MG tablet Take 500-1,000 mg by mouth every 6 (six) hours as needed for mild pain or fever.   Yes [provider]  amLODipine (NORVASC) 10 MG tablet Take 1  tablet (10 mg total) by mouth daily. 10/04/19  Yes Swayze, Ava, DO  atorvastatin (LIPITOR) 80 MG tablet Take 80 mg by mouth every evening.    Yes [provider]  calcitRIOL (ROCALTROL) 0.25 MCG capsule Take by mouth. 02/29/20 02/28/21 Yes [provider]  carvedilol (COREG) 25 MG tablet Take 25 mg by mouth 2 (two) times daily with a meal.   Yes [provider]  cinacalcet (SENSIPAR) 30 MG tablet Take 30 mg by mouth daily. 11/26/20  Yes [provider]  cloNIDine (CATAPRES) 0.1 MG tablet  11/26/20  Yes [provider]  Continuous Blood Gluc Sensor (FREESTYLE LIBRE 2 SENSOR) MISC 2 Devices by Does not apply route every 14 (fourteen) days. 12/01/20  Yes Matthew Snare, MD  furosemide (LASIX) 80 MG tablet Take 80 mg by mouth daily as needed for fluid or edema.   Yes [provider]  losartan (COZAAR) 100 MG tablet Take 100 mg by mouth daily. 11/10/20  Yes [provider]  HUMALOG 100 UNIT/ML injection Inject 1 mL (100 Units total) into the skin daily. 12/25/20   Matthew Shin, MD    Allergies as of 12/26/2020 - Review Complete 12/22/2020  Allergen Reaction Noted  . Sulfa antibiotics Anaphylaxis and Swelling 12/24/2010  . Oxycodone Nausea And Vomiting 10/19/2012  . Oxycontin [oxycodone hcl] Nausea And Vomiting 10/19/2012  . Penicillins Other (See Comments) 07/27/2017  . Prednisone Other (See Comments) 09/28/2013  . Shellfish-derived products  12/24/2010    Family History  Problem Relation Age of Onset  . Hypertension Other   . Coronary artery disease Other        Mother's side - both her side's grandparents died of heart disease (MIs)  . Diabetes Other   . Stroke Other        Paternal grandfather (22)  . Prostate cancer Neg Hx   . Bladder Cancer Neg Hx   . Kidney cancer Neg Hx     Social History   Socioeconomic History  . Marital status: Married    Spouse name: Not on file  . Number of children: Not on file  . Years of  education: Not on file  . Highest education level: Not on file  Occupational History  . Not on file  Tobacco Use  . Smoking status: Never Smoker  . Smokeless tobacco: Never Used  Vaping Use  . Vaping Use: Never used  Substance and Sexual Activity  . Alcohol use: No  . Drug use: No  . Sexual activity: Yes  Other Topics Concern  . Not on file  Social History Narrative  . Not on file   Social Determinants of Health   Financial Resource Strain: Not on file  Food Insecurity: Not on file  Transportation Needs: Not on file  Physical Activity: Not on file  Stress: Not on file  Social Connections: Not on file  Intimate Partner Violence: Not on file    Review of Systems: See HPI, otherwise negative ROS  Physical Exam: BP (!) 168/78   Pulse 87   Temp 97.6 F (36.4 C) (Temporal)   Wt 119.7 kg   SpO2 99%   BMI 34.83 kg/m  General:   Alert,  pleasant and cooperative in NAD Head:  Normocephalic and atraumatic. Neck:  Supple; no masses or thyromegaly. Lungs:  Clear throughout to auscultation.    Heart:  Regular rate and rhythm. Abdomen:  Soft, nontender and nondistended. Normal bowel sounds, without guarding, and without rebound.   Neurologic:  Alert and  oriented x4;  grossly normal neurologically.  Impression/Plan: [name] is here for an endoscopy and colonoscopy to be performed for pre op transplant  Risks, benefits, limitations, and alternatives regarding  ancolonoscopy have been reviewed with the patient.  Questions have been answered.  All parties agreeable.   Matthew Lame, MD  01/02/2021, 9:05 AM

## 2021-01-02 NOTE — Anesthesia Postprocedure Evaluation (Signed)
Anesthesia Post Note  Patient: [name]  Procedure(s) Performed: COLONOSCOPY WITH PROPOFOL (N/A ) POLYPECTOMY     Patient location during evaluation: PACU Anesthesia Type: General Level of consciousness: awake and alert Pain management: pain level controlled Vital Signs Assessment: post-procedure vital signs reviewed and stable Respiratory status: spontaneous breathing, nonlabored ventilation, respiratory function stable and patient connected to nasal cannula oxygen Cardiovascular status: blood pressure returned to baseline and stable Postop Assessment: no apparent nausea or vomiting Anesthetic complications: no   No complications documented.  Edmundo Tedesco, [place]

## 2021-01-02 NOTE — Transfer of Care (Signed)
Immediate Anesthesia Transfer of Care Note  Patient: [name]  Procedure(s) Performed: COLONOSCOPY WITH PROPOFOL (N/A ) POLYPECTOMY  Patient Location: PACU  Anesthesia Type: General  Level of Consciousness: awake, alert  and patient cooperative  Airway and Oxygen Therapy: Patient Spontanous Breathing   Post-op Assessment: Post-op Vital signs reviewed, Patient's Cardiovascular Status Stable, Respiratory Function Stable, Patent Airway and No signs of Nausea or vomiting  Post-op Vital Signs: Reviewed and stable  Complications: No complications documented.

## 2021-01-03 DIAGNOSIS — N186 End stage renal disease: Secondary | ICD-10-CM | POA: Diagnosis not present

## 2021-01-03 DIAGNOSIS — Z992 Dependence on renal dialysis: Secondary | ICD-10-CM | POA: Diagnosis not present

## 2021-01-04 DIAGNOSIS — Z992 Dependence on renal dialysis: Secondary | ICD-10-CM | POA: Diagnosis not present

## 2021-01-04 DIAGNOSIS — N186 End stage renal disease: Secondary | ICD-10-CM | POA: Diagnosis not present

## 2021-01-05 ENCOUNTER — Ambulatory Visit: Payer: Self-pay | Admitting: Gastroenterology

## 2021-01-05 ENCOUNTER — Encounter: Payer: Self-pay | Admitting: Gastroenterology

## 2021-01-05 DIAGNOSIS — J811 Chronic pulmonary edema: Secondary | ICD-10-CM | POA: Diagnosis not present

## 2021-01-05 DIAGNOSIS — J9602 Acute respiratory failure with hypercapnia: Secondary | ICD-10-CM | POA: Diagnosis not present

## 2021-01-05 DIAGNOSIS — E871 Hypo-osmolality and hyponatremia: Secondary | ICD-10-CM | POA: Diagnosis not present

## 2021-01-05 DIAGNOSIS — J810 Acute pulmonary edema: Secondary | ICD-10-CM | POA: Diagnosis not present

## 2021-01-05 DIAGNOSIS — N186 End stage renal disease: Secondary | ICD-10-CM | POA: Diagnosis not present

## 2021-01-05 DIAGNOSIS — I081 Rheumatic disorders of both mitral and tricuspid valves: Secondary | ICD-10-CM | POA: Diagnosis not present

## 2021-01-05 DIAGNOSIS — E875 Hyperkalemia: Secondary | ICD-10-CM | POA: Diagnosis not present

## 2021-01-05 DIAGNOSIS — E1022 Type 1 diabetes mellitus with diabetic chronic kidney disease: Secondary | ICD-10-CM | POA: Diagnosis not present

## 2021-01-05 DIAGNOSIS — G4733 Obstructive sleep apnea (adult) (pediatric): Secondary | ICD-10-CM | POA: Diagnosis not present

## 2021-01-05 DIAGNOSIS — G9341 Metabolic encephalopathy: Secondary | ICD-10-CM | POA: Diagnosis not present

## 2021-01-05 DIAGNOSIS — R4182 Altered mental status, unspecified: Secondary | ICD-10-CM | POA: Diagnosis not present

## 2021-01-05 DIAGNOSIS — E10649 Type 1 diabetes mellitus with hypoglycemia without coma: Secondary | ICD-10-CM | POA: Diagnosis not present

## 2021-01-05 DIAGNOSIS — Z9989 Dependence on other enabling machines and devices: Secondary | ICD-10-CM | POA: Diagnosis not present

## 2021-01-05 DIAGNOSIS — J9601 Acute respiratory failure with hypoxia: Secondary | ICD-10-CM | POA: Diagnosis not present

## 2021-01-05 DIAGNOSIS — R680 Hypothermia, not associated with low environmental temperature: Secondary | ICD-10-CM | POA: Diagnosis not present

## 2021-01-05 DIAGNOSIS — R001 Bradycardia, unspecified: Secondary | ICD-10-CM | POA: Diagnosis not present

## 2021-01-05 DIAGNOSIS — E161 Other hypoglycemia: Secondary | ICD-10-CM | POA: Diagnosis not present

## 2021-01-05 DIAGNOSIS — Z9641 Presence of insulin pump (external) (internal): Secondary | ICD-10-CM | POA: Diagnosis not present

## 2021-01-05 DIAGNOSIS — I440 Atrioventricular block, first degree: Secondary | ICD-10-CM | POA: Diagnosis not present

## 2021-01-05 DIAGNOSIS — E162 Hypoglycemia, unspecified: Secondary | ICD-10-CM | POA: Diagnosis not present

## 2021-01-05 DIAGNOSIS — Z992 Dependence on renal dialysis: Secondary | ICD-10-CM | POA: Diagnosis not present

## 2021-01-05 DIAGNOSIS — I517 Cardiomegaly: Secondary | ICD-10-CM | POA: Diagnosis not present

## 2021-01-05 DIAGNOSIS — I120 Hypertensive chronic kidney disease with stage 5 chronic kidney disease or end stage renal disease: Secondary | ICD-10-CM | POA: Diagnosis not present

## 2021-01-05 LAB — SURGICAL PATHOLOGY

## 2021-01-06 ENCOUNTER — Encounter: Payer: Self-pay | Admitting: Gastroenterology

## 2021-01-06 DIAGNOSIS — N186 End stage renal disease: Secondary | ICD-10-CM | POA: Diagnosis not present

## 2021-01-06 DIAGNOSIS — G4733 Obstructive sleep apnea (adult) (pediatric): Secondary | ICD-10-CM | POA: Diagnosis not present

## 2021-01-06 DIAGNOSIS — I509 Heart failure, unspecified: Secondary | ICD-10-CM | POA: Diagnosis not present

## 2021-01-06 DIAGNOSIS — E871 Hypo-osmolality and hyponatremia: Secondary | ICD-10-CM | POA: Diagnosis not present

## 2021-01-06 DIAGNOSIS — R0602 Shortness of breath: Secondary | ICD-10-CM | POA: Diagnosis not present

## 2021-01-06 DIAGNOSIS — G9341 Metabolic encephalopathy: Secondary | ICD-10-CM | POA: Diagnosis not present

## 2021-01-06 DIAGNOSIS — I130 Hypertensive heart and chronic kidney disease with heart failure and stage 1 through stage 4 chronic kidney disease, or unspecified chronic kidney disease: Secondary | ICD-10-CM | POA: Diagnosis not present

## 2021-01-06 DIAGNOSIS — I440 Atrioventricular block, first degree: Secondary | ICD-10-CM | POA: Diagnosis not present

## 2021-01-06 DIAGNOSIS — Z992 Dependence on renal dialysis: Secondary | ICD-10-CM | POA: Diagnosis not present

## 2021-01-06 DIAGNOSIS — I5031 Acute diastolic (congestive) heart failure: Secondary | ICD-10-CM | POA: Diagnosis not present

## 2021-01-06 DIAGNOSIS — I081 Rheumatic disorders of both mitral and tricuspid valves: Secondary | ICD-10-CM | POA: Diagnosis not present

## 2021-01-06 DIAGNOSIS — R001 Bradycardia, unspecified: Secondary | ICD-10-CM | POA: Diagnosis not present

## 2021-01-06 DIAGNOSIS — Z9641 Presence of insulin pump (external) (internal): Secondary | ICD-10-CM | POA: Diagnosis not present

## 2021-01-06 DIAGNOSIS — J9602 Acute respiratory failure with hypercapnia: Secondary | ICD-10-CM | POA: Diagnosis not present

## 2021-01-06 DIAGNOSIS — J810 Acute pulmonary edema: Secondary | ICD-10-CM | POA: Diagnosis not present

## 2021-01-06 DIAGNOSIS — E1022 Type 1 diabetes mellitus with diabetic chronic kidney disease: Secondary | ICD-10-CM | POA: Diagnosis not present

## 2021-01-06 DIAGNOSIS — E875 Hyperkalemia: Secondary | ICD-10-CM | POA: Diagnosis not present

## 2021-01-06 DIAGNOSIS — E038 Other specified hypothyroidism: Secondary | ICD-10-CM | POA: Diagnosis not present

## 2021-01-07 ENCOUNTER — Other Ambulatory Visit: Payer: Self-pay

## 2021-01-07 DIAGNOSIS — E1022 Type 1 diabetes mellitus with diabetic chronic kidney disease: Secondary | ICD-10-CM | POA: Diagnosis not present

## 2021-01-07 DIAGNOSIS — G9341 Metabolic encephalopathy: Secondary | ICD-10-CM | POA: Diagnosis not present

## 2021-01-07 DIAGNOSIS — N186 End stage renal disease: Secondary | ICD-10-CM | POA: Diagnosis not present

## 2021-01-07 DIAGNOSIS — E875 Hyperkalemia: Secondary | ICD-10-CM | POA: Diagnosis not present

## 2021-01-07 DIAGNOSIS — E871 Hypo-osmolality and hyponatremia: Secondary | ICD-10-CM | POA: Diagnosis not present

## 2021-01-07 DIAGNOSIS — Z9641 Presence of insulin pump (external) (internal): Secondary | ICD-10-CM | POA: Diagnosis not present

## 2021-01-07 DIAGNOSIS — I130 Hypertensive heart and chronic kidney disease with heart failure and stage 1 through stage 4 chronic kidney disease, or unspecified chronic kidney disease: Secondary | ICD-10-CM | POA: Diagnosis not present

## 2021-01-07 DIAGNOSIS — R001 Bradycardia, unspecified: Secondary | ICD-10-CM | POA: Diagnosis not present

## 2021-01-07 DIAGNOSIS — I440 Atrioventricular block, first degree: Secondary | ICD-10-CM | POA: Diagnosis not present

## 2021-01-07 DIAGNOSIS — Z992 Dependence on renal dialysis: Secondary | ICD-10-CM | POA: Diagnosis not present

## 2021-01-07 DIAGNOSIS — G4733 Obstructive sleep apnea (adult) (pediatric): Secondary | ICD-10-CM | POA: Diagnosis not present

## 2021-01-07 DIAGNOSIS — J9602 Acute respiratory failure with hypercapnia: Secondary | ICD-10-CM | POA: Diagnosis not present

## 2021-01-07 DIAGNOSIS — I081 Rheumatic disorders of both mitral and tricuspid valves: Secondary | ICD-10-CM | POA: Diagnosis not present

## 2021-01-07 DIAGNOSIS — I509 Heart failure, unspecified: Secondary | ICD-10-CM | POA: Diagnosis not present

## 2021-01-07 DIAGNOSIS — E038 Other specified hypothyroidism: Secondary | ICD-10-CM | POA: Diagnosis not present

## 2021-01-07 DIAGNOSIS — I5031 Acute diastolic (congestive) heart failure: Secondary | ICD-10-CM | POA: Diagnosis not present

## 2021-01-07 MED ORDER — TORSEMIDE 100 MG PO TABS
100.0000 mg | ORAL_TABLET | Freq: Every day | ORAL | 2 refills | Status: DC
  Filled 2021-01-07: qty 60, 60d supply, fill #0

## 2021-01-07 MED ORDER — LEVOTHYROXINE SODIUM 25 MCG PO TABS
ORAL_TABLET | ORAL | 2 refills | Status: DC
  Filled 2021-01-07: qty 60, 60d supply, fill #0

## 2021-01-07 MED FILL — Continuous Glucose System Sensor: 28 days supply | Qty: 2 | Fill #0 | Status: AC

## 2021-01-08 ENCOUNTER — Other Ambulatory Visit: Payer: Self-pay | Admitting: *Deleted

## 2021-01-08 DIAGNOSIS — N186 End stage renal disease: Secondary | ICD-10-CM | POA: Diagnosis not present

## 2021-01-08 DIAGNOSIS — Z992 Dependence on renal dialysis: Secondary | ICD-10-CM | POA: Diagnosis not present

## 2021-01-08 NOTE — Patient Outreach (Signed)
[facility]) Care Management  01/08/2021  [name] Feb 22, 1971 [id number]  Covering [name] Transition of care telephone call  Referral received:01/07/2021 Initial outreach: 01/08/2021 Surgery/procedure date:  Insurance: [place]  Initial unsuccessful telephone call to patient's preferred number in order to complete transition of care assessment; no answer and mailbox was full unable to leave a HIPAA approved voice message at this time.   Objective: Per the electronic medical record, [name]  was hospitalized at [facility] in [facility] from 01/05/2021-01/07/2021 for SOB. He was discharged to home on 01/07/2021 without the need for home health services or durable medical equipment per the discharge summary.    Plan: If no return call from the patient by end of business day today this RNCM will route unsuccessful outreach letter with [place] Management pamphlet and 24 hour Nurse Advice Line Magnet to [place] [facility] clinical pool to be mailed to patient's home address.  RNCM will attempt another outreach within 4 business days.  Matthew Mina, RN Care Management Coordinator [place] Office [phone]

## 2021-01-09 DIAGNOSIS — N186 End stage renal disease: Secondary | ICD-10-CM | POA: Diagnosis not present

## 2021-01-09 DIAGNOSIS — Z794 Long term (current) use of insulin: Secondary | ICD-10-CM | POA: Diagnosis not present

## 2021-01-09 DIAGNOSIS — Z992 Dependence on renal dialysis: Secondary | ICD-10-CM | POA: Diagnosis not present

## 2021-01-09 DIAGNOSIS — E109 Type 1 diabetes mellitus without complications: Secondary | ICD-10-CM | POA: Diagnosis not present

## 2021-01-09 DIAGNOSIS — D509 Iron deficiency anemia, unspecified: Secondary | ICD-10-CM | POA: Diagnosis not present

## 2021-01-10 DIAGNOSIS — Z992 Dependence on renal dialysis: Secondary | ICD-10-CM | POA: Diagnosis not present

## 2021-01-10 DIAGNOSIS — N186 End stage renal disease: Secondary | ICD-10-CM | POA: Diagnosis not present

## 2021-01-11 DIAGNOSIS — Z992 Dependence on renal dialysis: Secondary | ICD-10-CM | POA: Diagnosis not present

## 2021-01-11 DIAGNOSIS — N186 End stage renal disease: Secondary | ICD-10-CM | POA: Diagnosis not present

## 2021-01-12 DIAGNOSIS — E113513 Type 2 diabetes mellitus with proliferative diabetic retinopathy with macular edema, bilateral: Secondary | ICD-10-CM | POA: Diagnosis not present

## 2021-01-12 DIAGNOSIS — N186 End stage renal disease: Secondary | ICD-10-CM | POA: Diagnosis not present

## 2021-01-12 DIAGNOSIS — Z992 Dependence on renal dialysis: Secondary | ICD-10-CM | POA: Diagnosis not present

## 2021-01-13 ENCOUNTER — Other Ambulatory Visit: Payer: Self-pay

## 2021-01-13 ENCOUNTER — Other Ambulatory Visit: Payer: Self-pay | Admitting: *Deleted

## 2021-01-13 DIAGNOSIS — Z992 Dependence on renal dialysis: Secondary | ICD-10-CM | POA: Diagnosis not present

## 2021-01-13 DIAGNOSIS — N186 End stage renal disease: Secondary | ICD-10-CM | POA: Diagnosis not present

## 2021-01-13 MED ORDER — TORSEMIDE 100 MG PO TABS
100.0000 mg | ORAL_TABLET | Freq: Two times a day (BID) | ORAL | 11 refills | Status: DC
  Filled 2021-01-13: qty 60, 30d supply, fill #0
  Filled 2021-03-05: qty 60, 30d supply, fill #1
  Filled 2021-06-12: qty 10, 5d supply, fill #2
  Filled 2021-06-12: qty 50, 25d supply, fill #2
  Filled 2021-09-02: qty 60, 30d supply, fill #3

## 2021-01-13 MED ORDER — CINACALCET HCL 60 MG PO TABS
60.0000 mg | ORAL_TABLET | Freq: Every day | ORAL | 3 refills | Status: DC
  Filled 2021-01-13: qty 30, 30d supply, fill #0
  Filled 2021-03-05: qty 30, 30d supply, fill #1

## 2021-01-13 NOTE — Patient Outreach (Signed)
[facility]) Care Management  01/13/2021  [name] 24-Nov-1970 [id number]   Transition of care call Referral received: 01/07/21 Initial outreach attempt: 01/08/21 Insurance: [place]   #2 Outreach Attempt  2nd unsuccessful telephone call to patient's preferred contact number in order to complete post hospital discharge transition of care assessment , no answer left HIPAA compliant message requesting return call.    Objective: Per electronic medical record,[name]  was hospitalized at [facility] 4/4-01/07/21 for Acute respiratory failure, Acute Metabolic encephalopathy due to hypoglycemia 24.  Comorbidities include: Type 1 Diabetes ( A1c 57.8 9/78/47) Diastolic heart failure, hypertension , obesity, ESRD Peritoneal Dialysis OSA CPAP. He  was discharged to home on 01/07/21 without the need for home health services or DME.   Plan If no return call from patient will attempt 3rd outreach in the next 4 business days.    Joylene Draft, RN, BSN  [place] Management Coordinator  [phone]- Mobile [phone]- Toll Free Main Office

## 2021-01-14 ENCOUNTER — Other Ambulatory Visit: Payer: Self-pay

## 2021-01-14 DIAGNOSIS — N186 End stage renal disease: Secondary | ICD-10-CM | POA: Diagnosis not present

## 2021-01-14 DIAGNOSIS — Z992 Dependence on renal dialysis: Secondary | ICD-10-CM | POA: Diagnosis not present

## 2021-01-15 ENCOUNTER — Other Ambulatory Visit: Payer: Self-pay

## 2021-01-15 DIAGNOSIS — N186 End stage renal disease: Secondary | ICD-10-CM | POA: Diagnosis not present

## 2021-01-15 DIAGNOSIS — Z992 Dependence on renal dialysis: Secondary | ICD-10-CM | POA: Diagnosis not present

## 2021-01-16 ENCOUNTER — Other Ambulatory Visit: Payer: Self-pay | Admitting: *Deleted

## 2021-01-16 DIAGNOSIS — N186 End stage renal disease: Secondary | ICD-10-CM | POA: Diagnosis not present

## 2021-01-16 DIAGNOSIS — Z992 Dependence on renal dialysis: Secondary | ICD-10-CM | POA: Diagnosis not present

## 2021-01-16 NOTE — Patient Outreach (Signed)
[facility]) Care Management  01/16/2021  [name] April 01, 1971 [id number]   Transition of care call Referral received: 01/07/21 Initial outreach attempt: 01/08/21 Insurance: [place]   #3 Call Attempt  Third unsuccessful telephone call to patient's preferred contact number in order to complete post hospital discharge transition of care assessment; no answer, left HIPAA compliant message requesting return call.   Objective: Per electronic medical record,[name] was hospitalized at [facility] 4/4-01/07/21 for Acute respiratory failure, Acute Metabolic encephalopathy due to hypoglycemia 24.  Comorbidities include: Type 1 Diabetes ( A1c 93.5 04/03/76) Diastolic heart failure, hypertension , obesity, ESRD Peritoneal Dialysis OSA CPAP. He  was discharged to home on 01/07/21 without the need for home health servicesor DME.    Plan: If no return call from patient, will plan return call in the next 3 weeks.  Joylene Draft, RN, BSN  [place] Management Coordinator  [phone]- Mobile [phone]- Toll Free Main Office

## 2021-01-17 DIAGNOSIS — Z992 Dependence on renal dialysis: Secondary | ICD-10-CM | POA: Diagnosis not present

## 2021-01-17 DIAGNOSIS — N186 End stage renal disease: Secondary | ICD-10-CM | POA: Diagnosis not present

## 2021-01-18 DIAGNOSIS — Z992 Dependence on renal dialysis: Secondary | ICD-10-CM | POA: Diagnosis not present

## 2021-01-18 DIAGNOSIS — N186 End stage renal disease: Secondary | ICD-10-CM | POA: Diagnosis not present

## 2021-01-19 DIAGNOSIS — Z992 Dependence on renal dialysis: Secondary | ICD-10-CM | POA: Diagnosis not present

## 2021-01-19 DIAGNOSIS — N186 End stage renal disease: Secondary | ICD-10-CM | POA: Diagnosis not present

## 2021-01-20 DIAGNOSIS — N186 End stage renal disease: Secondary | ICD-10-CM | POA: Diagnosis not present

## 2021-01-20 DIAGNOSIS — Z992 Dependence on renal dialysis: Secondary | ICD-10-CM | POA: Diagnosis not present

## 2021-01-22 ENCOUNTER — Inpatient Hospital Stay (HOSPITAL_COMMUNITY)
Admission: EM | Admit: 2021-01-22 | Discharge: 2021-01-24 | DRG: 637 | Disposition: A | Discharge status: Home / Self Care | Payer: 59 | Attending: Internal Medicine | Admitting: Internal Medicine

## 2021-01-22 ENCOUNTER — Encounter: Payer: Self-pay | Admitting: Endocrinology

## 2021-01-22 ENCOUNTER — Emergency Department (HOSPITAL_COMMUNITY): Payer: 59

## 2021-01-22 DIAGNOSIS — J9602 Acute respiratory failure with hypercapnia: Secondary | ICD-10-CM | POA: Diagnosis present

## 2021-01-22 DIAGNOSIS — R680 Hypothermia, not associated with low environmental temperature: Secondary | ICD-10-CM | POA: Diagnosis present

## 2021-01-22 DIAGNOSIS — R000 Tachycardia, unspecified: Secondary | ICD-10-CM | POA: Diagnosis not present

## 2021-01-22 DIAGNOSIS — E669 Obesity, unspecified: Secondary | ICD-10-CM | POA: Diagnosis present

## 2021-01-22 DIAGNOSIS — E8770 Fluid overload, unspecified: Secondary | ICD-10-CM | POA: Diagnosis not present

## 2021-01-22 DIAGNOSIS — E785 Hyperlipidemia, unspecified: Secondary | ICD-10-CM | POA: Diagnosis present

## 2021-01-22 DIAGNOSIS — Z6835 Body mass index (BMI) 35.0-35.9, adult: Secondary | ICD-10-CM

## 2021-01-22 DIAGNOSIS — G9341 Metabolic encephalopathy: Secondary | ICD-10-CM | POA: Diagnosis present

## 2021-01-22 DIAGNOSIS — E039 Hypothyroidism, unspecified: Secondary | ICD-10-CM | POA: Diagnosis present

## 2021-01-22 DIAGNOSIS — I120 Hypertensive chronic kidney disease with stage 5 chronic kidney disease or end stage renal disease: Secondary | ICD-10-CM | POA: Diagnosis present

## 2021-01-22 DIAGNOSIS — T68XXXA Hypothermia, initial encounter: Secondary | ICD-10-CM

## 2021-01-22 DIAGNOSIS — Z79890 Hormone replacement therapy (postmenopausal): Secondary | ICD-10-CM | POA: Diagnosis not present

## 2021-01-22 DIAGNOSIS — N186 End stage renal disease: Secondary | ICD-10-CM | POA: Diagnosis not present

## 2021-01-22 DIAGNOSIS — R404 Transient alteration of awareness: Secondary | ICD-10-CM | POA: Diagnosis not present

## 2021-01-22 DIAGNOSIS — Z8249 Family history of ischemic heart disease and other diseases of the circulatory system: Secondary | ICD-10-CM

## 2021-01-22 DIAGNOSIS — D631 Anemia in chronic kidney disease: Secondary | ICD-10-CM | POA: Diagnosis not present

## 2021-01-22 DIAGNOSIS — Z992 Dependence on renal dialysis: Secondary | ICD-10-CM

## 2021-01-22 DIAGNOSIS — G4733 Obstructive sleep apnea (adult) (pediatric): Secondary | ICD-10-CM | POA: Diagnosis present

## 2021-01-22 DIAGNOSIS — N19 Unspecified kidney failure: Secondary | ICD-10-CM | POA: Diagnosis not present

## 2021-01-22 DIAGNOSIS — Z8673 Personal history of transient ischemic attack (TIA), and cerebral infarction without residual deficits: Secondary | ICD-10-CM

## 2021-01-22 DIAGNOSIS — J690 Pneumonitis due to inhalation of food and vomit: Secondary | ICD-10-CM | POA: Diagnosis not present

## 2021-01-22 DIAGNOSIS — J9601 Acute respiratory failure with hypoxia: Secondary | ICD-10-CM | POA: Diagnosis not present

## 2021-01-22 DIAGNOSIS — Z20822 Contact with and (suspected) exposure to covid-19: Secondary | ICD-10-CM | POA: Diagnosis not present

## 2021-01-22 DIAGNOSIS — J810 Acute pulmonary edema: Secondary | ICD-10-CM | POA: Diagnosis not present

## 2021-01-22 DIAGNOSIS — E1069 Type 1 diabetes mellitus with other specified complication: Secondary | ICD-10-CM

## 2021-01-22 DIAGNOSIS — E162 Hypoglycemia, unspecified: Secondary | ICD-10-CM | POA: Diagnosis not present

## 2021-01-22 DIAGNOSIS — E11649 Type 2 diabetes mellitus with hypoglycemia without coma: Secondary | ICD-10-CM | POA: Diagnosis not present

## 2021-01-22 DIAGNOSIS — I491 Atrial premature depolarization: Secondary | ICD-10-CM | POA: Diagnosis not present

## 2021-01-22 DIAGNOSIS — E1042 Type 1 diabetes mellitus with diabetic polyneuropathy: Secondary | ICD-10-CM | POA: Diagnosis present

## 2021-01-22 DIAGNOSIS — E7800 Pure hypercholesterolemia, unspecified: Secondary | ICD-10-CM | POA: Diagnosis present

## 2021-01-22 DIAGNOSIS — Z794 Long term (current) use of insulin: Secondary | ICD-10-CM | POA: Diagnosis not present

## 2021-01-22 DIAGNOSIS — E10649 Type 1 diabetes mellitus with hypoglycemia without coma: Principal | ICD-10-CM | POA: Diagnosis present

## 2021-01-22 DIAGNOSIS — Z9641 Presence of insulin pump (external) (internal): Secondary | ICD-10-CM | POA: Diagnosis present

## 2021-01-22 DIAGNOSIS — E161 Other hypoglycemia: Secondary | ICD-10-CM | POA: Diagnosis not present

## 2021-01-22 DIAGNOSIS — E870 Hyperosmolality and hypernatremia: Secondary | ICD-10-CM

## 2021-01-22 DIAGNOSIS — Z79899 Other long term (current) drug therapy: Secondary | ICD-10-CM

## 2021-01-22 DIAGNOSIS — R0902 Hypoxemia: Secondary | ICD-10-CM | POA: Diagnosis not present

## 2021-01-22 DIAGNOSIS — Z981 Arthrodesis status: Secondary | ICD-10-CM

## 2021-01-22 DIAGNOSIS — R4182 Altered mental status, unspecified: Secondary | ICD-10-CM | POA: Diagnosis present

## 2021-01-22 DIAGNOSIS — E875 Hyperkalemia: Secondary | ICD-10-CM | POA: Diagnosis present

## 2021-01-22 DIAGNOSIS — R4020 Unspecified coma: Secondary | ICD-10-CM | POA: Diagnosis not present

## 2021-01-22 DIAGNOSIS — N250 Renal osteodystrophy: Secondary | ICD-10-CM | POA: Diagnosis not present

## 2021-01-22 LAB — CBC
HCT: 46.9 % (ref 39.0–52.0)
Hemoglobin: 15 g/dL (ref 13.0–17.0)
MCH: 30.1 pg (ref 26.0–34.0)
MCHC: 32 g/dL (ref 30.0–36.0)
MCV: 94.2 fL (ref 80.0–100.0)
Platelets: 289 10*3/uL (ref 150–400)
RBC: 4.98 MIL/uL (ref 4.22–5.81)
RDW: 15.2 % (ref 11.5–15.5)
WBC: 9 10*3/uL (ref 4.0–10.5)
nRBC: 0 % (ref 0.0–0.2)

## 2021-01-22 LAB — COMPREHENSIVE METABOLIC PANEL
ALT: 26 U/L (ref 0–44)
AST: 47 U/L — ABNORMAL HIGH (ref 15–41)
Albumin: 3.3 g/dL — ABNORMAL LOW (ref 3.5–5.0)
Alkaline Phosphatase: 79 U/L (ref 38–126)
Anion gap: 12 (ref 5–15)
BUN: 84 mg/dL — ABNORMAL HIGH (ref 6–20)
CO2: 22 mmol/L (ref 22–32)
Calcium: 7.8 mg/dL — ABNORMAL LOW (ref 8.9–10.3)
Chloride: 98 mmol/L (ref 98–111)
Creatinine, Ser: 7.89 mg/dL — ABNORMAL HIGH (ref 0.61–1.24)
GFR, Estimated: 8 mL/min — ABNORMAL LOW (ref >60)
Glucose, Bld: 90 mg/dL (ref 70–99)
Potassium: 5.8 mmol/L — ABNORMAL HIGH (ref 3.5–5.1)
Sodium: 132 mmol/L — ABNORMAL LOW (ref 135–145)
Total Bilirubin: 1 mg/dL (ref 0.3–1.2)
Total Protein: 6.8 g/dL (ref 6.5–8.1)

## 2021-01-22 LAB — LACTIC ACID, PLASMA: Lactic Acid, Venous: 0.9 mmol/L (ref 0.5–1.9)

## 2021-01-22 LAB — I-STAT VENOUS BLOOD GAS, ED
Acid-base deficit: 2 mmol/L (ref 0.0–2.0)
Bicarbonate: 26.7 mmol/L (ref 20.0–28.0)
Calcium, Ion: 0.96 mmol/L — ABNORMAL LOW (ref 1.15–1.40)
HCT: 47 % (ref 39.0–52.0)
Hemoglobin: 16 g/dL (ref 13.0–17.0)
O2 Saturation: 50 %
Potassium: 5.4 mmol/L — ABNORMAL HIGH (ref 3.5–5.1)
Sodium: 133 mmol/L — ABNORMAL LOW (ref 135–145)
TCO2: 29 mmol/L (ref 22–32)
pCO2, Ven: 62.7 mmHg — ABNORMAL HIGH (ref 44.0–60.0)
pH, Ven: 7.238 — ABNORMAL LOW (ref 7.250–7.430)
pO2, Ven: 32 mmHg (ref 32.0–45.0)

## 2021-01-22 LAB — HEMOGLOBIN A1C
Hgb A1c MFr Bld: 8.1 % — ABNORMAL HIGH (ref 4.8–5.6)
Mean Plasma Glucose: 185.77 mg/dL

## 2021-01-22 LAB — GLUCOSE, CAPILLARY
Glucose-Capillary: 260 mg/dL — ABNORMAL HIGH (ref 70–99)
Glucose-Capillary: 380 mg/dL — ABNORMAL HIGH (ref 70–99)
Glucose-Capillary: 187 mg/dL — ABNORMAL HIGH (ref 70–99)
Glucose-Capillary: 180 mg/dL — ABNORMAL HIGH (ref 70–99)
Glucose-Capillary: 161 mg/dL — ABNORMAL HIGH (ref 70–99)
Glucose-Capillary: 213 mg/dL — ABNORMAL HIGH (ref 70–99)
Glucose-Capillary: 205 mg/dL — ABNORMAL HIGH (ref 70–99)
Glucose-Capillary: 192 mg/dL — ABNORMAL HIGH (ref 70–99)
Glucose-Capillary: 250 mg/dL — ABNORMAL HIGH (ref 70–99)
Glucose-Capillary: 232 mg/dL — ABNORMAL HIGH (ref 70–99)
Glucose-Capillary: 129 mg/dL — ABNORMAL HIGH (ref 70–99)
Glucose-Capillary: 157 mg/dL — ABNORMAL HIGH (ref 70–99)

## 2021-01-22 LAB — CBG MONITORING, ED
Glucose-Capillary: 71 mg/dL (ref 70–99)
Glucose-Capillary: 92 mg/dL (ref 70–99)

## 2021-01-22 LAB — TROPONIN I (HIGH SENSITIVITY)
Troponin I (High Sensitivity): 15 ng/L (ref <18)
Troponin I (High Sensitivity): 16 ng/L (ref <18)

## 2021-01-22 LAB — RESP PANEL BY RT-PCR (FLU A&B, COVID) ARPGX2
Influenza A by PCR: NEGATIVE
Influenza B by PCR: NEGATIVE
SARS Coronavirus 2 by RT PCR: NEGATIVE

## 2021-01-22 LAB — BRAIN NATRIURETIC PEPTIDE: B Natriuretic Peptide: 89.9 pg/mL (ref 0.0–100.0)

## 2021-01-22 LAB — MRSA PCR SCREENING: MRSA by PCR: NEGATIVE

## 2021-01-22 LAB — HIV ANTIBODY (ROUTINE TESTING W REFLEX): HIV Screen 4th Generation wRfx: NONREACTIVE

## 2021-01-22 MED ORDER — HEPARIN 1000 UNIT/ML FOR PERITONEAL DIALYSIS
INTRAPERITONEAL | Status: DC | PRN
  Filled 2021-01-22 (×2): qty 5000

## 2021-01-22 MED ORDER — DELFLEX-LC/2.5% DEXTROSE 394 MOSM/L IP SOLN
INTRAPERITONEAL | Status: DC
  Administered 2021-01-23: 5000 mL via INTRAPERITONEAL

## 2021-01-22 MED ORDER — LEVOFLOXACIN IN D5W 500 MG/100ML IV SOLN
500.0000 mg | INTRAVENOUS | Status: DC
  Filled 2021-01-22: qty 100

## 2021-01-22 MED ORDER — CALCIUM GLUCONATE-NACL 1-0.675 GM/50ML-% IV SOLN
1.0000 g | Freq: Once | INTRAVENOUS | Status: AC
  Administered 2021-01-22: 1000 mg via INTRAVENOUS
  Filled 2021-01-22: qty 50

## 2021-01-22 MED ORDER — HEPARIN 1000 UNIT/ML FOR PERITONEAL DIALYSIS: 500.0000 [IU] | INTRAMUSCULAR | Status: DC | PRN

## 2021-01-22 MED ORDER — ONDANSETRON HCL 4 MG/2ML IJ SOLN: 4.0000 mg | Freq: Four times a day (QID) | INTRAMUSCULAR | Status: DC | PRN

## 2021-01-22 MED ORDER — HYDRALAZINE HCL 20 MG/ML IJ SOLN: 10.0000 mg | INTRAMUSCULAR | Status: DC | PRN

## 2021-01-22 MED ORDER — GENTAMICIN SULFATE 0.1 % EX CREA: 1.0000 "application " | TOPICAL_CREAM | Freq: Every day | CUTANEOUS | Status: DC

## 2021-01-22 MED ORDER — ONDANSETRON HCL 4 MG/2ML IJ SOLN
4.0000 mg | Freq: Four times a day (QID) | INTRAMUSCULAR | Status: DC | PRN
  Administered 2021-01-22 – 2021-01-23 (×3): 4 mg via INTRAVENOUS
  Filled 2021-01-22 (×3): qty 2

## 2021-01-22 MED ORDER — ENOXAPARIN SODIUM 30 MG/0.3ML ~~LOC~~ SOLN
30.0000 mg | Freq: Every day | SUBCUTANEOUS | Status: DC
  Administered 2021-01-22 – 2021-01-23 (×2): 30 mg via SUBCUTANEOUS
  Filled 2021-01-22 (×2): qty 0.3

## 2021-01-22 MED ORDER — DELFLEX-LC/4.25% DEXTROSE 483 MOSM/L IP SOLN
INTRAPERITONEAL | Status: DC
  Administered 2021-01-23: 5000 mL via INTRAPERITONEAL

## 2021-01-22 MED ORDER — INSULIN ASPART 100 UNIT/ML ~~LOC~~ SOLN: 0.0000 [IU] | Freq: Three times a day (TID) | SUBCUTANEOUS | Status: DC

## 2021-01-22 MED ORDER — DELFLEX-LC/4.25% DEXTROSE 483 MOSM/L IP SOLN: INTRAPERITONEAL | Status: DC

## 2021-01-22 MED ORDER — DELFLEX-LC/2.5% DEXTROSE 394 MOSM/L IP SOLN: INTRAPERITONEAL | Status: DC

## 2021-01-22 MED ORDER — FUROSEMIDE 10 MG/ML IJ SOLN
80.0000 mg | Freq: Once | INTRAMUSCULAR | Status: AC
  Administered 2021-01-22: 80 mg via INTRAVENOUS
  Filled 2021-01-22: qty 8

## 2021-01-22 MED ORDER — LEVOFLOXACIN IN D5W 500 MG/100ML IV SOLN
500.0000 mg | INTRAVENOUS | Status: DC
  Administered 2021-01-22: 500 mg via INTRAVENOUS
  Filled 2021-01-22: qty 100

## 2021-01-22 MED ORDER — INSULIN ASPART 100 UNIT/ML ~~LOC~~ SOLN
2.0000 [IU] | SUBCUTANEOUS | Status: DC
Start: 2021-01-22 — End: 2021-01-22
  Administered 2021-01-22 (×2): 6 [IU] via SUBCUTANEOUS

## 2021-01-22 MED ORDER — SODIUM CHLORIDE 0.9 % IV BOLUS
1000.0000 mL | Freq: Once | INTRAVENOUS | Status: AC
  Administered 2021-01-22: 1000 mL via INTRAVENOUS

## 2021-01-22 MED ORDER — PIPERACILLIN-TAZOBACTAM 3.375 G IVPB 30 MIN
3.3750 g | Freq: Once | INTRAVENOUS | Status: AC
  Administered 2021-01-22: 3.375 g via INTRAVENOUS
  Filled 2021-01-22: qty 50

## 2021-01-22 MED ORDER — CLONIDINE HCL 0.1 MG PO TABS
0.1000 mg | ORAL_TABLET | Freq: Three times a day (TID) | ORAL | Status: DC
  Administered 2021-01-22 – 2021-01-23 (×2): 0.1 mg via ORAL
  Filled 2021-01-22 (×2): qty 1

## 2021-01-22 MED ORDER — SODIUM POLYSTYRENE SULFONATE 15 GM/60ML PO SUSP
30.0000 g | Freq: Once | ORAL | Status: AC
  Administered 2021-01-22: 30 g via RECTAL
  Filled 2021-01-22: qty 120

## 2021-01-22 MED ORDER — INSULIN REGULAR(HUMAN) IN NACL 100-0.9 UT/100ML-% IV SOLN
INTRAVENOUS | Status: DC
  Administered 2021-01-22: 3 [IU]/h via INTRAVENOUS
  Administered 2021-01-22: 2.6 [IU]/h via INTRAVENOUS
  Filled 2021-01-22: qty 100

## 2021-01-22 MED ORDER — POLYETHYLENE GLYCOL 3350 17 G PO PACK: 17.0000 g | PACK | Freq: Every day | ORAL | Status: DC | PRN

## 2021-01-22 MED ORDER — INSULIN ASPART 100 UNIT/ML ~~LOC~~ SOLN
0.0000 [IU] | Freq: Three times a day (TID) | SUBCUTANEOUS | Status: DC
  Administered 2021-01-22: 20 [IU] via SUBCUTANEOUS

## 2021-01-22 MED ORDER — AMLODIPINE BESYLATE 10 MG PO TABS
10.0000 mg | ORAL_TABLET | Freq: Every day | ORAL | Status: DC
  Administered 2021-01-22 – 2021-01-24 (×3): 10 mg via ORAL
  Filled 2021-01-22 (×3): qty 1

## 2021-01-22 MED ORDER — CHLORHEXIDINE GLUCONATE CLOTH 2 % EX PADS
6.0000 | MEDICATED_PAD | Freq: Every day | CUTANEOUS | Status: DC
  Administered 2021-01-23: 6 via TOPICAL

## 2021-01-22 MED ORDER — DEXTROSE 50 % IV SOLN: 0.0000 mL | INTRAVENOUS | Status: DC | PRN

## 2021-01-22 MED ORDER — DOCUSATE SODIUM 100 MG PO CAPS: 100.0000 mg | ORAL_CAPSULE | Freq: Two times a day (BID) | ORAL | Status: DC | PRN

## 2021-01-22 MED ORDER — GENTAMICIN SULFATE 0.1 % EX CREA
1.0000 "application " | TOPICAL_CREAM | Freq: Every day | CUTANEOUS | Status: DC
  Administered 2021-01-23: 1 via TOPICAL
  Filled 2021-01-22: qty 15

## 2021-01-22 NOTE — ED Notes (Signed)
Attempted 2nd IV line. Attempt unsuccessful.

## 2021-01-22 NOTE — ED Triage Notes (Signed)
Pt arrive via [facility] due to his wife finding him unconscious around at 3 am. EMS reports his initial blood sugar was 51, they gave him a bag d10 and oral glucose. EMS also reports pts sats were intially 70 on room air. They put the pt on 15 nrb and his sats are in the high 90s. Pt is a diabetic, and a dialysis pt. Pt has no hx of copd or chf. EMS reports axox4. Pt reports being sob and denies chest pain.

## 2021-01-22 NOTE — Progress Notes (Signed)
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  eICU Interventions  Plan: 1. D/C Hydralazine. 2. Amlodipine 10 mg PO now and Q day. 3. Catapres 0.1 mg PO TIB. Hold dose for SBP < 110.      Intervention Category Major Interventions: Hypertension - evaluation and management  Brittaney Beaulieu Eugene 01/22/2021, 10:22 PM

## 2021-01-22 NOTE — ED Provider Notes (Signed)
[facility] Emergency Department Provider Note MRN:  [id number]  Arrival date & time: 01/22/21     Chief Complaint   Hypoglycemia and Shortness of Breath   History of Present Illness   [name] is a 50 y.o. year-old male with a history of ESRD on peritoneal dialysis, type 1 diabetes, obesity presenting to the ED with chief complaint of hypoglycemia and shortness of breath.  Patient found unconscious by wife at 3 AM.  Initial blood sugar with EMS was 51.  They intervened with D10 and oral glucose.  Patient's initial saturations in the 70s on room air.  On nonrebreather.  Patient is endorsing shortness of breath.  Denies pain.  Does not know what happened.  Review of Systems  Positive for hypoglycemia, shortness of breath.  Patient's Health History    Past Medical History:  Diagnosis Date  . ESRD (end stage renal disease) ([place])   . Gastroparesis   . Herniated cervical disc   . HTN (hypertension)   . Hypercholesteremia   . Morbid obesity ([place])   . Peritoneal dialysis status ([place])   . PONV (postoperative nausea and vomiting)    after peritoneal dialysis catheter was placed  . S/P cardiac cath    a. 10-15 yrs ago at [facility] due to tachycardia, reportedly normal. b. Normal ETT 03/2012.  Marland Kitchen Sinus tachycardia    a. 24-hr Holter 03/2012 - SR, occ PVCs, no VT, avg HR 96bpm.  . TIA (transient ischemic attack)   . Type 1 diabetes mellitus ([place])    a. With insulin pump.    Past Surgical History:  Procedure Laterality Date  . BIOPSY  10/03/2019   Procedure: BIOPSY;  Surgeon: Mauri Pole, MD;  Location: [facility];  Service: Endoscopy;;  . cervical neck fusion    . COLONOSCOPY WITH PROPOFOL N/A 01/02/2021   Procedure: COLONOSCOPY WITH PROPOFOL;  Surgeon: [name], MD;  Location: [place];  Service: Endoscopy;  Laterality: N/A;  priority 4 DIABETIC needs potassium draw  . ESOPHAGOGASTRODUODENOSCOPY (EGD) WITH PROPOFOL N/A 10/03/2019    Procedure: ESOPHAGOGASTRODUODENOSCOPY (EGD) WITH PROPOFOL;  Surgeon: Mauri Pole, MD;  [facility];  Service: Endoscopy;  Laterality: N/A;  . HERNIA REPAIR     bilateral  . LEFT HEART CATHETERIZATION WITH CORONARY ANGIOGRAM N/A 12/18/2013   Procedure: LEFT HEART CATHETERIZATION WITH CORONARY ANGIOGRAM;  Surgeon: [name] M Martinique, MD;  [facility] C[facility];  Service: Cardiovascular;  Laterality: N/A;  . POLYPECTOMY  01/02/2021   Procedure: POLYPECTOMY;  Surgeon: [name], MD;  Location: [place];  Service: Endoscopy;;    Family History  Problem Relation Age of Onset  . Hypertension Other   . Coronary artery disease Other        Mother's side - both her side's grandparents died of heart disease (MIs)  . Diabetes Other   . Stroke Other        Paternal grandfather (25)  . Prostate cancer Neg Hx   . Bladder Cancer Neg Hx   . Kidney cancer Neg Hx     Social History   Socioeconomic History  . Marital status: Married    Spouse name: Not on file  . Number of children: Not on file  . Years of education: Not on file  . Highest education level: Not on file  Occupational History  . Not on file  Tobacco Use  . Smoking status: Never Smoker  . Smokeless tobacco: Never Used  Vaping Use  .  Vaping Use: Never used  Substance and Sexual Activity  . Alcohol use: No  . Drug use: No  . Sexual activity: Yes  Other Topics Concern  . Not on file  Social History Narrative  . Not on file   Social Determinants of Health   Financial Resource Strain: Not on file  Food Insecurity: Not on file  Transportation Needs: Not on file  Physical Activity: Not on file  Stress: Not on file  Social Connections: Not on file  Intimate Partner Violence: Not on file     Physical Exam   Vitals:   01/22/21 0515 01/22/21 0548  BP:  132/78  Pulse: 71 69  Resp: 18 15  Temp:    SpO2: 100% 100%    CONSTITUTIONAL: Chronically ill-appearing, NAD NEURO:  Alert and oriented x 3,  no focal deficits EYES:  eyes equal and reactive ENT/NECK:  no LAD, no JVD CARDIO: Regular rate, well-perfused, normal S1 and S2 PULM:  CTAB no wheezing or rhonchi, tachypneic GI/GU:  normal bowel sounds, non-distended, non-tender MSK/SPINE:  No gross deformities, no edema SKIN:  no rash, atraumatic PSYCH:  Appropriate speech and behavior  *Additional and/or pertinent findings included in MDM below  Diagnostic and Interventional Summary    EKG Interpretation  Date/Time:  Thursday January 22 2021 04:50:49 EDT Ventricular Rate:  68 PR Interval:  202 QRS Duration: 98 QT Interval:  460 QTC Calculation: 490 R Axis:   35 Text Interpretation: Sinus rhythm Borderline prolonged PR interval Borderline prolonged QT interval Confirmed by Gerlene Fee [phone]) on 01/22/2021 5:25:27 AM      Labs Reviewed  COMPREHENSIVE METABOLIC PANEL - Abnormal; Notable for the following components:      Result Value   Sodium 132 (*)    Potassium 5.8 (*)    BUN 84 (*)    Creatinine, Ser 7.89 (*)    Calcium 7.8 (*)    Albumin 3.3 (*)    AST 47 (*)    GFR, Estimated 8 (*)    All other components within normal limits  I-STAT VENOUS BLOOD GAS, ED - Abnormal; Notable for the following components:   pH, Ven 7.238 (*)    pCO2, Ven 62.7 (*)    Sodium 133 (*)    Potassium 5.4 (*)    Calcium, Ion 0.96 (*)    All other components within normal limits  CULTURE, BLOOD (SINGLE)  RESP PANEL BY RT-PCR (FLU A&B, COVID) ARPGX2  CBC  LACTIC ACID, PLASMA  BRAIN NATRIURETIC PEPTIDE  URINALYSIS, ROUTINE W REFLEX MICROSCOPIC  CBG MONITORING, ED  CBG MONITORING, ED  TROPONIN I (HIGH SENSITIVITY)  TROPONIN I (HIGH SENSITIVITY)    DG Chest Port 1 View  Final Result      Medications  piperacillin-tazobactam (ZOSYN) IVPB 3.375 g (3.375 g Intravenous New Bag/Given 01/22/21 0548)  sodium polystyrene (KAYEXALATE) 15 GM/60ML suspension 30 g (has no administration in time range)  furosemide (LASIX) injection 80 mg (has  no administration in time range)  sodium chloride 0.9 % bolus 1,000 mL (1,000 mLs Intravenous New Bag/Given 01/22/21 6301)     Procedures  /  Critical Care .Critical Care Performed by: Maudie Flakes, MD Authorized by: Maudie Flakes, MD   Critical care provider statement:    Critical care time (minutes):  45   Critical care was necessary to treat or prevent imminent or life-threatening deterioration of the following conditions:  Respiratory failure   Critical care was time spent personally by me on the following  activities:  Discussions with consultants, evaluation of patient's response to treatment, examination of patient, ordering and performing treatments and interventions, ordering and review of laboratory studies, ordering and review of radiographic studies, pulse oximetry, re-evaluation of patient's condition, obtaining history from patient or surrogate and review of old charts    ED Course and Medical Decision Making  I have reviewed the triage vital signs, the nursing notes, and pertinent available records from the EMR.  Listed above are laboratory and imaging tests that I personally ordered, reviewed, and interpreted and then considered in my medical decision making (see below for details).  Hypoglycemia, clear cause.  Considering too much insulin versus infection.  Shortness of breath and hypoxia possibly due to aspiration in the setting of being unconscious awaiting chest x-ray, labs.  Patient is currently on nonrebreather satting 100%.  He is not in severe distress, he is conversant.  Will monitor airway closely.     Continues to protect airway.  Patient is found to be hypothermic, now on the bear hugger.  A bit acidotic, retaining CO2.  Will trial BiPAP.  Still conversant, no vomiting.  Labs reveal significant uremia, nephrology is consulted and providing recommendations.  Admitted to intensivist service for further care.  Barth Kirks. Sedonia Small, [place] [email or web address]  Final Clinical Impressions(s) / ED Diagnoses     ICD-10-CM   1. Hypoglycemia  E16.2   2. Aspiration pneumonia of right lung, unspecified aspiration pneumonia type, unspecified part of lung ([place])  J69.0   3. Hypothermia, initial encounter  T68.XXXA   4. Uremia  N19     ED Discharge Orders    None       Discharge Instructions Discussed with and Provided to Patient:   Discharge Instructions   None       Maudie Flakes, MD 01/22/21 [phone]

## 2021-01-22 NOTE — Progress Notes (Signed)
Inpatient Diabetes Program Recommendations  AACE/ADA: New Consensus Statement on Inpatient Glycemic Control (2015)  Target Ranges:  Prepandial:   less than 140 mg/dL      Peak postprandial:   less than 180 mg/dL (1-2 hours)      Critically ill patients:  140 - 180 mg/dL   Lab Results  Component Value Date   GLUCAP 380 (H) 01/22/2021   HGBA1C 8.1 (H) 01/22/2021    Review of Glycemic Control  Diabetes history: DM type 1. Newly seeing Dr. Dwyane Dee Outpatient Diabetes medications: Medtronic pump guardian sensor Current orders for Inpatient glycemic control: Novolog 0-20 units tid  Transitioning to IV insulin until home pump can be brought to hospital and restarted.  Overlap IV insulin with insulin pump 1 hour after insulin pump is started. Pt knows to create another insertion site prior to starting insulin pump.  Inpatient Diabetes Program Recommendations:    Spoke with pt at bedside regarding insulin pump and hypoglycemia at home. Pt has seen Dr. Dwyane Dee with [facility] and last saw him 3 weeks ago. He ordered a new insulin pump that has hypoglycemia alarms and also has the Auto mode that can turn off when pt glucose is trending toward hypoglycemia.  Dr. Erin Fulling requesting that pt be on his new insulin pump or on safer settings while expediting the insulin pump  Called Dr. Dwyane Dee. He stated we are still a good week or so out for getting the insulin pump. Insurance is waiting a letter stating the pt needs the pump. Discussed pt case with Dr. Dwyane Dee. He suggested that insulin pump changes were needed at the midnight setting for the 2 different profiles in his pump.   Profile for high glucose in dialysate fluid reduce 12A setting from 2.0 units/hour to 1.7 units/hour  Profile for regular dialysate fluid reduce from 1.7 units/hour to 1.4 units/hour   Dr. Dwyane Dee states that Pt has an appt on May 12 th. If glucose trends are ok keep that appt. If glucose trends are still not within normal  range to call/message him.  [place] [employer] insulin pump rep phone number [phone]  Thanks,  Tama Headings RN, MSN, BC-ADM Inpatient Diabetes Coordinator Team Pager [phone] (8a-5p)

## 2021-01-22 NOTE — ED Notes (Signed)
Pt on bear hugger due to low temp

## 2021-01-22 NOTE — H&P (Signed)
NAME:  [name], MRN:  [id number], DOB:  14-Aug-1971, LOS: 0 ADMISSION DATE:  01/22/2021, CONSULTATION DATE:  01/22/21 REFERRING MD:  EDP, CHIEF COMPLAINT:   Altered mental status  History of Present Illness:  [name] is a 50 year old male with past medical history of type 1 diabetes and end-stage renal disease on peritoneal dialysis, hypertension, hyperlipidemia, on CPAP obesity who presented after wife found him unconscious.  His initial reported blood sugar was 51 EMS treated with D10 and mental status improved.  Patient was also hypoxic in the 70s on room air which improved with nonrebreather.    In the ED, patient was not initially tachycardic or hypotensive but was noted to be hypothermic with temp of 90 4F requiring 15 L nonrebreather.  Bair hugger was placed and his mental status improved and patient was conversant.  ABG with respiratory acidosis pH 7.23 PCO2 62.7 and potassium 5.4.  CXR with interstitial coarsening right greater than left.  He was given fluids and Zosyn initially then then Lasix and Kayexalate and started on BiPAP.  PCCM consulted for admission    Epic notes, patient was hospitalized at [facility] 4/4 - 12/10/2503 for metabolic encephalopathy secondary to hypoglycemia  Pertinent  Medical History   has a past medical history of ESRD (end stage renal disease) ([place]), Gastroparesis, Herniated cervical disc, HTN (hypertension), Hypercholesteremia, Morbid obesity ([place]), Peritoneal dialysis status ([place]), PONV (postoperative nausea and vomiting), S/P cardiac cath, Sinus tachycardia, TIA (transient ischemic attack), and Type 1 diabetes mellitus ([place]).   Significant Hospital Events: Including procedures, antibiotic start and stop dates in addition to other pertinent events    4/21 admit to PCCM, Zosyn continued, was started on BiPAP  Interim History / Subjective:  As above, improving mental status since arrival  Objective   Blood pressure 140/71,  pulse 81, temperature (!) 93.5 F (34.2 C), temperature source Rectal, resp. rate 14, height 5\' 11"  (1.803 m), weight 117.9 kg, SpO2 100 %.       No intake or output data in the 24 hours ending 01/22/21 3976 Filed Weights   01/22/21 0430  Weight: 117.9 kg    Examination: General: nad, resting comfortable, on NIV HENT: ncat, eomi, perrla, mmmp Lungs: diminished bilaterally, r >L Cardiovascular: rrr Abdomen: obese, nd, nt Extremities: + edema bilaterally, no c/c Neuro: drowsy but awakening and conversant, aaox3, moves all 4 GU: deferred  Labs/imaging that I havepersonally reviewed  (right click and "Reselect all SmartList Selections" daily)  K 5.8 Cr 7.8 lactate 0.9  Resolved Hospital Problem list   hypoglycemia  Assessment & Plan:  t1dm with hypoglycemic event:  -bs noted to be in low 50's -found unresponsive by wife -on insulin pump that has now been turned off.  -follows with endocrine as outpt and has had trouble with hypoglyemic events of late.  -allow po as tolerated -insulin ssi at this time.  -a1c pending Acute metabolic encephalopathy:  -2/2 hypoglycemia -improving but still drowsy, will need close monitoring  Acute hypoxic/hypercarbic resp failure:  -base on vbg -reportedly 70's on RA  -noted infiltrate -EDP placed on NIV ? Will attempt to remove as soon as possible Aspiration pneumonia:  -abx -titrate oxygen  Esrd:  -on peritoneal dialysis -follows with [facility] -did not do his dwell last evening as wife reports "nephrology said he could skip that day". -consult nephro.  Hyperkalemia:  -giving kayexylate via oral route  -was given lasix in ed.   Htn Hyperlipidemia Hypothyroidism -cont home meds  Best practice (right click and "Reselect all SmartList Selections" daily)  Diet:  Oral Pain/Anxiety/Delirium protocol (if indicated): No VAP protocol (if indicated): Not indicated DVT prophylaxis: LMWH GI prophylaxis: N/A Glucose control:   SSI Yes Central venous access:  N/A Arterial line:  N/A Foley:  N/A Mobility:  OOB  PT consulted: N/A Last date of multidisciplinary goals of care discussion [pending, updated wife at bedside 4/21] Code Status:  full code Disposition: icu  Labs   CBC: Recent Labs  Lab 01/22/21 0442 01/22/21 0503  WBC 9.0  --   HGB 15.0 16.0  HCT 46.9 47.0  MCV 94.2  --   PLT 289  --     Basic Metabolic Panel: Recent Labs  Lab 01/22/21 0442 01/22/21 0503  NA 132* 133*  K 5.8* 5.4*  CL 98  --   CO2 22  --   GLUCOSE 90  --   BUN 84*  --   CREATININE 7.89*  --   CALCIUM 7.8*  --    GFR: Estimated Creatinine Clearance: 14.9 mL/min (A) (by C-G formula based on SCr of 7.89 mg/dL (H)). Recent Labs  Lab 01/22/21 0442  WBC 9.0  LATICACIDVEN 0.9    Liver Function Tests: Recent Labs  Lab 01/22/21 0442  AST 47*  ALT 26  ALKPHOS 79  BILITOT 1.0  PROT 6.8  ALBUMIN 3.3*   No results for input(s): LIPASE, AMYLASE in the last 168 hours. No results for input(s): AMMONIA in the last 168 hours.  ABG    Component Value Date/Time   HCO3 26.7 01/22/2021 0503   TCO2 29 01/22/2021 0503   ACIDBASEDEF 2.0 01/22/2021 0503   O2SAT 50.0 01/22/2021 0503     Coagulation Profile: No results for input(s): INR, PROTIME in the last 168 hours.  Cardiac Enzymes: No results for input(s): CKTOTAL, CKMB, CKMBINDEX, TROPONINI in the last 168 hours.  HbA1C: Hemoglobin A1C  Date/Time Value Ref Range Status  10/14/2020 12:00 AM 13.3  Final   Hgb A1c MFr Bld  Date/Time Value Ref Range Status  01/23/2020 02:59 PM 10.5 (H) 4.8 - 5.6 % Final    Comment:    (NOTE) Pre diabetes:          5.7%-6.4% Diabetes:              >6.4% Glycemic control for   <7.0% adults with diabetes   09/27/2019 02:00 AM 10.2 (H) 4.8 - 5.6 % Final    Comment:    (NOTE)         Prediabetes: 5.7 - 6.4         Diabetes: >6.4         Glycemic control for adults with diabetes: <7.0     CBG: Recent Labs  Lab  01/22/21 0428 01/22/21 0556  GLUCAP 71 92    Review of Systems:   Tired, denies pain, no n/v, mildly confused about events prior to admission. Denies other associated symptoms.   Past Medical History:  He,  has a past medical history of ESRD (end stage renal disease) ([place]), Gastroparesis, Herniated cervical disc, HTN (hypertension), Hypercholesteremia, Morbid obesity ([place]), Peritoneal dialysis status ([place]), PONV (postoperative nausea and vomiting), S/P cardiac cath, Sinus tachycardia, TIA (transient ischemic attack), and Type 1 diabetes mellitus ([place]).   Surgical History:   Past Surgical History:  Procedure Laterality Date  . BIOPSY  10/03/2019   Procedure: BIOPSY;  Surgeon: Mauri Pole, MD;  [facility];  Service: Endoscopy;;  . cervical neck fusion    .  COLONOSCOPY WITH PROPOFOL N/A 01/02/2021   Procedure: COLONOSCOPY WITH PROPOFOL;  Surgeon: [name], MD;  Location: [place];  Service: Endoscopy;  Laterality: N/A;  priority 4 DIABETIC needs potassium draw  . ESOPHAGOGASTRODUODENOSCOPY (EGD) WITH PROPOFOL N/A 10/03/2019   Procedure: ESOPHAGOGASTRODUODENOSCOPY (EGD) WITH PROPOFOL;  Surgeon: Mauri Pole, MD;  [facility];  Service: Endoscopy;  Laterality: N/A;  . HERNIA REPAIR     bilateral  . LEFT HEART CATHETERIZATION WITH CORONARY ANGIOGRAM N/A 12/18/2013   Procedure: LEFT HEART CATHETERIZATION WITH CORONARY ANGIOGRAM;  Surgeon: [name] M Martinique, MD;  Location: [facility] CATH LAB;  Service: Cardiovascular;  Laterality: N/A;  . POLYPECTOMY  01/02/2021   Procedure: POLYPECTOMY;  Surgeon: [name], MD;  Location: [place];  Service: Endoscopy;;     Social History:   reports that he has never smoked. He has never used smokeless tobacco. He reports that he does not drink alcohol and does not use drugs.   Family History:  His family history includes Coronary artery disease in an other family member; Diabetes in an other family  member; Hypertension in an other family member; Stroke in an other family member. There is no history of Prostate cancer, Bladder Cancer, or Kidney cancer.   Allergies Allergies  Allergen Reactions  . Sulfa Antibiotics Anaphylaxis and Swelling    Pt is not allergic to iodine, has had iodine in the past w/o premeds and w/ no problems  . Oxycodone Nausea And Vomiting  . Oxycontin [Oxycodone Hcl] Nausea And Vomiting  . Penicillins Other (See Comments)    Possible reaction many years ago per patient (Pt reports he has had pcn without issues since time of "reaction") Other reaction(s): Unknown  . Prednisone Other (See Comments)    Patient is diabetic, runs sugar up Other reaction(s): Other (See Comments), Other (See Comments) Patient is diabetic, runs sugar up Patient is diabetic, runs sugar up Patient is diabetic, runs sugar up   . Shellfish-Derived Products Swelling    Eyes swell with shellfish Pt is not allergic to iodine, has had iodine in the past w/o premeds and w/ no problem     Home Medications  Prior to Admission medications   Medication Sig Start Date End Date Taking? Authorizing Provider  acetaminophen (TYLENOL) 500 MG tablet Take 500-1,000 mg by mouth every 6 (six) hours as needed for mild pain or fever.    [provider]  amLODipine (NORVASC) 10 MG tablet Take 1 tablet (10 mg total) by mouth daily. 10/04/19   Swayze, Ava, DO  amLODipine (NORVASC) 10 MG tablet TAKE 1 TABLET BY MOUTH ONCE DAILY 11/07/20 11/07/21  Anthonette Legato, MD  amoxicillin-clavulanate (AUGMENTIN) 500-125 MG tablet TAKE 1 TABLET BY MOUTH ONCE AS DIRECTED 12/31/20 12/31/21  Holley Raring, Munsoor, MD  atorvastatin (LIPITOR) 80 MG tablet Take 80 mg by mouth every evening.     [provider]  atorvastatin (LIPITOR) 80 MG tablet TAKE 1 TABLET BY MOUTH ONCE DAILY 10/24/20 10/24/21  Anthonette Legato, MD  atorvastatin (LIPITOR) 80 MG tablet TAKE 1 TABLET BY MOUTH ONCE DAILY 11/14/19 11/13/20  Judi Cong, MD   calcitRIOL (ROCALTROL) 0.25 MCG capsule Take by mouth. 02/29/20 02/28/21  [provider]  carvedilol (COREG) 25 MG tablet Take 25 mg by mouth 2 (two) times daily with a meal.    [provider]  carvedilol (COREG) 25 MG tablet TAKE 1 TABLET BY MOUTH TWO TIMES DAILY WITH MEALS 04/02/20 04/02/21  Anthonette Legato, MD  cinacalcet (SENSIPAR) 30  MG tablet Take 30 mg by mouth daily. 11/26/20   [provider]  cinacalcet (SENSIPAR) 60 MG tablet Take 1 tablet (60 mg total) by mouth daily. 01/13/21   Anthonette Legato, MD  cloNIDine (CATAPRES) 0.1 MG tablet  11/26/20   [provider]  cloNIDine (CATAPRES) 0.1 MG tablet TAKE 1 TABLET BY MOUTH THREE TIMES DAILY 11/26/20 11/26/21  Anthonette Legato, MD  Continuous Blood Gluc Sensor (FREESTYLE LIBRE 2 SENSOR) MISC APPLY EVERY 14 DAYS. 12/01/20 12/01/21  Elayne Snare, MD  ferric citrate (AURYXIA) 1 GM 210 MG(Fe) tablet TAKE 3 TABLETS BY MOUTH WITH MEALS AND 1 TABLET WITH SNACKS. DON'T EXCEED 11 TABLETS DAILY. 12/23/20 12/23/21  Anthonette Legato, MD  ferric citrate (AURYXIA) 1 GM 210 MG(Fe) tablet TAKE 2 TABLETS BY MOUTH WITH MEALS AND TAKE 1 TABLET BY MOUTH WITH SNACKS 08/22/20 08/22/21  Lateef, Munsoor, MD  furosemide (LASIX) 40 MG tablet TAKE 3 TABLETS BY MOUTH DAILY 12/23/20 12/23/21  Holley Raring, Munsoor, MD  furosemide (LASIX) 80 MG tablet Take 80 mg by mouth daily as needed for fluid or edema.    [provider]  furosemide (LASIX) 80 MG tablet TAKE 1 TABLET BY MOUTH 1 TIME EACH DAY 10/22/20 10/22/21  Lateef, Munsoor, MD  HUMALOG 100 UNIT/ML injection USE UP TO 100 UNITS TOTAL VIA INSULIN PUMP DAILY 12/25/20 12/25/21  Renato Shin, MD  hydrALAZINE (APRESOLINE) 25 MG tablet TAKE 1 TABLET BY MOUTH 3 TIMES DAILY 11/07/20 11/07/21  Holley Raring, Munsoor, MD  insulin lispro (HUMALOG) 100 UNIT/ML injection USE VIA INSULIN PUMP UP TO 80 UNITS PER DAY 11/14/19 11/13/20  Judi Cong, MD  levothyroxine (SYNTHROID) 25 MCG tablet Take 1 tablet (25 mcg total)  by mouth Daily at 0600. 01/07/21     losartan (COZAAR) 100 MG tablet Take 100 mg by mouth daily. 11/10/20   [provider]  losartan (COZAAR) 100 MG tablet TAKE 1 TABLET BY MOUTH ONCE DAILY 11/07/20 11/07/21  Anthonette Legato, MD  Na Sulfate-K Sulfate-Mg Sulf 17.5-3.13-1.6 GM/177ML SOLN USE AS DIRECTED 12/26/20 12/26/21  [name], MD  torsemide (DEMADEX) 100 MG tablet Take 1 tablet (100 mg total) by mouth daily. 01/07/21     torsemide (DEMADEX) 100 MG tablet Take 1 tablet (100 mg total) by mouth 2 (two) times daily. 01/13/21   Lateef, Munsoor, MD  triamcinolone ointment (KENALOG) 0.1 % APPLY TOPICALLY 2 TIMES A DAY 10/22/20 10/22/21  Lateef, Munsoor, MD  promethazine (PHENERGAN) 25 MG tablet Take 1/2 (12.5mg ) to 1 (25mg ) tab as needed for nausea and vomiting not controlled with Zofran up to every 6 hours. 09/30/20 12/04/20  Orma Render, NP     Critical care time: The patient is critically ill with multiple organ systems failure and requires high complexity decision making for assessment and support, frequent evaluation and titration of therapies, application of advanced monitoring technologies and extensive interpretation of multiple databases.  Critical care time 35 mins. This represents my time independent of the NPs time taking care of the pt. This is excluding procedures.    Audria Nine DO [facility] 01/22/2021, 6:37 AM See Amion for pager If no response to pager, please call [phone] until 1900 After 1900 please call [facility]

## 2021-01-22 NOTE — Consult Note (Signed)
[facility] Renal Consultation Note    Indication for Consultation:  Management of ESRD/hemodialysis, anemia, hypertension/volume, and secondary hyperparathyroidism.  HPI: [name] is a 50 y.o. male with PMH including ESRD on PD, type 1 diabetes, hypertension, hyperlipidemia and sleep apnea who was found unconscious by his wife. BS was 51 by EMS and he was treated with D10 with improvement in mental status. He was also hypoxic and hypothermic and started on bear hugger and nonrebreather. ABG showed respiratory acidosis with pH 7.23 and PCO2 62.7. K+ 5.4 and he was given kayexalate. CXR showed bilateral edema with possible superimposed R infiltrate. Patient was noted to have edema on exam. He was not able to complete his PD last night. He was recently admitted to [facility] 4/4-4/6 for hypoglycemia as well. He will be admitted to ICU and nephrology has been consulted for management of ESRD. PD through [facility]. Active on [facility] transplant list. Patient reports he has been on PD for about 7 months. Reports he had pulmonary edema during his last admission and has recently changed to using all 2.5% dextrose exchanges. He does 5 exchanges overnight with 3L fill volume.   At time of exam, patient is feeling better. He denies any shortness of breath at present. No chest pain, palpitations, dizziness, or nausea. BS improved to 260 and VSS. He has been titrated down to 4L O2 via [place] and is tolerating this well.   Past Medical History:  Diagnosis Date  . ESRD (end stage renal disease) ([place])   . Gastroparesis   . Herniated cervical disc   . HTN (hypertension)   . Hypercholesteremia   . Morbid obesity ([place])   . Peritoneal dialysis status ([place])   . PONV (postoperative nausea and vomiting)    after peritoneal dialysis catheter was placed  . S/P cardiac cath    a. 10-15 yrs ago at [facility] due to tachycardia, reportedly normal. b. Normal ETT 03/2012.  Marland Kitchen Sinus  tachycardia    a. 24-hr Holter 03/2012 - SR, occ PVCs, no VT, avg HR 96bpm.  . TIA (transient ischemic attack)   . Type 1 diabetes mellitus ([place])    a. With insulin pump.   Past Surgical History:  Procedure Laterality Date  . BIOPSY  10/03/2019   Procedure: BIOPSY;  Surgeon: Mauri Pole, MD;  Location: [facility];  Service: Endoscopy;;  . cervical neck fusion    . COLONOSCOPY WITH PROPOFOL N/A 01/02/2021   Procedure: COLONOSCOPY WITH PROPOFOL;  Surgeon: [name], MD;  Location: [place];  Service: Endoscopy;  Laterality: N/A;  priority 4 DIABETIC needs potassium draw  . ESOPHAGOGASTRODUODENOSCOPY (EGD) WITH PROPOFOL N/A 10/03/2019   Procedure: ESOPHAGOGASTRODUODENOSCOPY (EGD) WITH PROPOFOL;  Surgeon: Mauri Pole, MD;  Location: [facility];  Service: Endoscopy;  Laterality: N/A;  . HERNIA REPAIR     bilateral  . LEFT HEART CATHETERIZATION WITH CORONARY ANGIOGRAM N/A 12/18/2013   Procedure: LEFT HEART CATHETERIZATION WITH CORONARY ANGIOGRAM;  Surgeon: [name] M Martinique, MD;  Location: [facility] CATH LAB;  Service: Cardiovascular;  Laterality: N/A;  . POLYPECTOMY  01/02/2021   Procedure: POLYPECTOMY;  Surgeon: [name], MD;  Location: [place];  Service: Endoscopy;;   Family History  Problem Relation Age of Onset  . Hypertension Other   . Coronary artery disease Other        Mother's side - both her side's grandparents died of heart disease (MIs)  . Diabetes Other   . Stroke Other  Paternal grandfather (60)  . Prostate cancer Neg Hx   . Bladder Cancer Neg Hx   . Kidney cancer Neg Hx    Social History:  reports that he has never smoked. He has never used smokeless tobacco. He reports that he does not drink alcohol and does not use drugs.  ROS: As per HPI otherwise negative.  Physical Exam: Vitals:   01/22/21 0548 01/22/21 0610 01/22/21 0630 01/22/21 0742  BP: 132/78 117/66 140/71 (!) 108/57  Pulse: 69 80 81 80  Resp: 15 (!) 22 14 15    Temp:    (!) 97 F (36.1 C)  TempSrc:    Axillary  SpO2: 100% 100% 100% 99%  Weight:      Height:         General: Well developed, well nourished, alert and in no acute distress. Head: Normocephalic, atraumatic, sclera non-icteric, mucus membranes are moist. Neck:  JVD not elevated. Lungs: Respirations unlabored on O2 via [place]. + rhonchi bilateral lower lobes Heart: RRR with normal S1, S2. No murmurs, rubs, or gallops appreciated. Abdomen: Soft, non-distended with normoactive bowel sounds.  Musculoskeletal:  Strength and tone appear normal for age. Lower extremities: 2+ pitting edema bilateral lower extremities Neuro: Alert and oriented X 3. Moves all extremities spontaneously. Psych:  Responds to questions appropriately with a normal affect. Dialysis Access: PD catheter in abdomen  Allergies  Allergen Reactions  . Sulfa Antibiotics Anaphylaxis and Swelling    Pt is not allergic to iodine, has had iodine in the past w/o premeds and w/ no problems  . Oxycodone Nausea And Vomiting  . Oxycontin [Oxycodone Hcl] Nausea And Vomiting  . Penicillins Other (See Comments)    Possible reaction many years ago per patient (Pt reports he has had pcn without issues since time of "reaction") Other reaction(s): Unknown  . Prednisone Other (See Comments)    Patient is diabetic, runs sugar up Other reaction(s): Other (See Comments), Other (See Comments) Patient is diabetic, runs sugar up Patient is diabetic, runs sugar up Patient is diabetic, runs sugar up   . Shellfish-Derived Products Swelling    Eyes swell with shellfish Pt is not allergic to iodine, has had iodine in the past w/o premeds and w/ no problem   Prior to Admission medications   Medication Sig Start Date End Date Taking? Authorizing Provider  amLODipine (NORVASC) 10 MG tablet TAKE 1 TABLET BY MOUTH ONCE DAILY 11/07/20 11/07/21 Yes Lateef, Munsoor, MD  atorvastatin (LIPITOR) 80 MG tablet TAKE 1 TABLET BY MOUTH ONCE DAILY 10/24/20  10/24/21 Yes Lateef, Munsoor, MD  carvedilol (COREG) 25 MG tablet TAKE 1 TABLET BY MOUTH TWO TIMES DAILY WITH MEALS 04/02/20 04/02/21 Yes Lateef, Munsoor, MD  cinacalcet (SENSIPAR) 30 MG tablet Take 30 mg by mouth daily. 11/26/20  Yes [provider]  cloNIDine (CATAPRES) 0.1 MG tablet TAKE 1 TABLET BY MOUTH THREE TIMES DAILY 11/26/20 11/26/21 Yes Lateef, Munsoor, MD  acetaminophen (TYLENOL) 500 MG tablet Take 500-1,000 mg by mouth every 6 (six) hours as needed for mild pain or fever.    [provider]  amLODipine (NORVASC) 10 MG tablet Take 1 tablet (10 mg total) by mouth daily. Patient not taking: Reported on 01/22/2021 10/04/19   Swayze, Ava, DO  amoxicillin-clavulanate (AUGMENTIN) 500-125 MG tablet TAKE 1 TABLET BY MOUTH ONCE AS DIRECTED Patient not taking: Reported on 01/22/2021 12/31/20 12/31/21  Anthonette Legato, MD  atorvastatin (LIPITOR) 80 MG tablet Take 80 mg by mouth every evening.     [provider]  atorvastatin (LIPITOR) 80 MG tablet TAKE 1 TABLET BY MOUTH ONCE DAILY 11/14/19 11/13/20  Judi Cong, MD  calcitRIOL (ROCALTROL) 0.25 MCG capsule Take by mouth. 02/29/20 02/28/21  [provider]  carvedilol (COREG) 25 MG tablet Take 25 mg by mouth 2 (two) times daily with a meal.    [provider]  cinacalcet (SENSIPAR) 60 MG tablet Take 1 tablet (60 mg total) by mouth daily. Patient not taking: Reported on 01/22/2021 01/13/21   Anthonette Legato, MD  cloNIDine (CATAPRES) 0.1 MG tablet  11/26/20   [provider]  Continuous Blood Gluc Sensor (FREESTYLE LIBRE 2 SENSOR) MISC APPLY EVERY 14 DAYS. 12/01/20 12/01/21  Elayne Snare, MD  ferric citrate (AURYXIA) 1 GM 210 MG(Fe) tablet TAKE 3 TABLETS BY MOUTH WITH MEALS AND 1 TABLET WITH SNACKS. DON'T EXCEED 11 TABLETS DAILY. 12/23/20 12/23/21  Anthonette Legato, MD  ferric citrate (AURYXIA) 1 GM 210 MG(Fe) tablet TAKE 2 TABLETS BY MOUTH WITH MEALS AND TAKE 1 TABLET BY MOUTH WITH SNACKS 08/22/20 08/22/21  Lateef,  Munsoor, MD  furosemide (LASIX) 40 MG tablet TAKE 3 TABLETS BY MOUTH DAILY 12/23/20 12/23/21  Holley Raring, Munsoor, MD  furosemide (LASIX) 80 MG tablet Take 80 mg by mouth daily as needed for fluid or edema.    [provider]  furosemide (LASIX) 80 MG tablet TAKE 1 TABLET BY MOUTH 1 TIME EACH DAY 10/22/20 10/22/21  Lateef, Munsoor, MD  HUMALOG 100 UNIT/ML injection USE UP TO 100 UNITS TOTAL VIA INSULIN PUMP DAILY 12/25/20 12/25/21  Renato Shin, MD  hydrALAZINE (APRESOLINE) 25 MG tablet TAKE 1 TABLET BY MOUTH 3 TIMES DAILY 11/07/20 11/07/21  Holley Raring, Munsoor, MD  insulin lispro (HUMALOG) 100 UNIT/ML injection USE VIA INSULIN PUMP UP TO 80 UNITS PER DAY 11/14/19 11/13/20  Judi Cong, MD  levothyroxine (SYNTHROID) 25 MCG tablet Take 1 tablet (25 mcg total) by mouth Daily at 0600. 01/07/21     losartan (COZAAR) 100 MG tablet Take 100 mg by mouth daily. 11/10/20   [provider]  losartan (COZAAR) 100 MG tablet TAKE 1 TABLET BY MOUTH ONCE DAILY 11/07/20 11/07/21  Anthonette Legato, MD  Na Sulfate-K Sulfate-Mg Sulf 17.5-3.13-1.6 GM/177ML SOLN USE AS DIRECTED 12/26/20 12/26/21  [name], MD  torsemide (DEMADEX) 100 MG tablet Take 1 tablet (100 mg total) by mouth daily. 01/07/21     torsemide (DEMADEX) 100 MG tablet Take 1 tablet (100 mg total) by mouth 2 (two) times daily. 01/13/21   Lateef, Munsoor, MD  triamcinolone ointment (KENALOG) 0.1 % APPLY TOPICALLY 2 TIMES A DAY 10/22/20 10/22/21  Lateef, Munsoor, MD  promethazine (PHENERGAN) 25 MG tablet Take 1/2 (12.5mg ) to 1 (25mg ) tab as needed for nausea and vomiting not controlled with Zofran up to every 6 hours. 09/30/20 12/04/20  Orma Render, NP   Current Facility-Administered Medications  Medication Dose Route Frequency Provider Last Rate Last Admin  . calcium gluconate 1 g/ 50 mL sodium chloride IVPB  1 g Intravenous Once Harrie Jeans C, MD      . docusate sodium (COLACE) capsule 100 mg  100 mg Oral BID PRN Audria Nine, DO      . enoxaparin (LOVENOX)  injection 30 mg  30 mg Subcutaneous Daily Audria Nine, DO      . levofloxacin (LEVAQUIN) IVPB 500 mg  500 mg Intravenous Q48H Audria Nine, DO      . ondansetron [facility]) injection 4 mg  4 mg Intravenous Q6H PRN Audria Nine, DO      .  polyethylene glycol (MIRALAX / GLYCOLAX) packet 17 g  17 g Oral Daily PRN Audria Nine, DO       Current Outpatient Medications  Medication Sig Dispense Refill  . amLODipine (NORVASC) 10 MG tablet TAKE 1 TABLET BY MOUTH ONCE DAILY 30 tablet 11  . atorvastatin (LIPITOR) 80 MG tablet TAKE 1 TABLET BY MOUTH ONCE DAILY 30 tablet 11  . carvedilol (COREG) 25 MG tablet TAKE 1 TABLET BY MOUTH TWO TIMES DAILY WITH MEALS 60 tablet 11  . cinacalcet (SENSIPAR) 30 MG tablet Take 30 mg by mouth daily.    . cloNIDine (CATAPRES) 0.1 MG tablet TAKE 1 TABLET BY MOUTH THREE TIMES DAILY 270 tablet 3  . acetaminophen (TYLENOL) 500 MG tablet Take 500-1,000 mg by mouth every 6 (six) hours as needed for mild pain or fever.    Marland Kitchen amLODipine (NORVASC) 10 MG tablet Take 1 tablet (10 mg total) by mouth daily. (Patient not taking: Reported on 01/22/2021) 30 tablet 0  . amoxicillin-clavulanate (AUGMENTIN) 500-125 MG tablet TAKE 1 TABLET BY MOUTH ONCE AS DIRECTED (Patient not taking: Reported on 01/22/2021) 1 tablet 0  . atorvastatin (LIPITOR) 80 MG tablet Take 80 mg by mouth every evening.     Marland Kitchen atorvastatin (LIPITOR) 80 MG tablet TAKE 1 TABLET BY MOUTH ONCE DAILY 90 tablet 1  . calcitRIOL (ROCALTROL) 0.25 MCG capsule Take by mouth.    . carvedilol (COREG) 25 MG tablet Take 25 mg by mouth 2 (two) times daily with a meal.    . cinacalcet (SENSIPAR) 60 MG tablet Take 1 tablet (60 mg total) by mouth daily. (Patient not taking: Reported on 01/22/2021) 90 tablet 3  . cloNIDine (CATAPRES) 0.1 MG tablet     . Continuous Blood Gluc Sensor (FREESTYLE LIBRE 2 SENSOR) MISC APPLY EVERY 14 DAYS. 2 each 3  . ferric citrate (AURYXIA) 1 GM 210 MG(Fe) tablet TAKE 3 TABLETS BY MOUTH WITH MEALS  AND 1 TABLET WITH SNACKS. DON'T EXCEED 11 TABLETS DAILY. 990 tablet 3  . ferric citrate (AURYXIA) 1 GM 210 MG(Fe) tablet TAKE 2 TABLETS BY MOUTH WITH MEALS AND TAKE 1 TABLET BY MOUTH WITH SNACKS 270 tablet 3  . furosemide (LASIX) 40 MG tablet TAKE 3 TABLETS BY MOUTH DAILY 270 tablet 3  . furosemide (LASIX) 80 MG tablet Take 80 mg by mouth daily as needed for fluid or edema.    . furosemide (LASIX) 80 MG tablet TAKE 1 TABLET BY MOUTH 1 TIME EACH DAY 90 tablet 3  . HUMALOG 100 UNIT/ML injection USE UP TO 100 UNITS TOTAL VIA INSULIN PUMP DAILY 10 mL 11  . hydrALAZINE (APRESOLINE) 25 MG tablet TAKE 1 TABLET BY MOUTH 3 TIMES DAILY 90 tablet 0  . insulin lispro (HUMALOG) 100 UNIT/ML injection USE VIA INSULIN PUMP UP TO 80 UNITS PER DAY 30 mL 11  . levothyroxine (SYNTHROID) 25 MCG tablet Take 1 tablet (25 mcg total) by mouth Daily at 0600. 60 tablet 2  . losartan (COZAAR) 100 MG tablet Take 100 mg by mouth daily.    Marland Kitchen losartan (COZAAR) 100 MG tablet TAKE 1 TABLET BY MOUTH ONCE DAILY 30 tablet 11  . Na Sulfate-K Sulfate-Mg Sulf 17.5-3.13-1.6 GM/177ML SOLN USE AS DIRECTED 354 mL 0  . torsemide (DEMADEX) 100 MG tablet Take 1 tablet (100 mg total) by mouth daily. 60 tablet 2  . torsemide (DEMADEX) 100 MG tablet Take 1 tablet (100 mg total) by mouth 2 (two) times daily. 60 tablet 11  . triamcinolone ointment (KENALOG) 0.1 %  APPLY TOPICALLY 2 TIMES A DAY 30 g 0   Labs: Basic Metabolic Panel: Recent Labs  Lab 01/22/21 0442 01/22/21 0503  NA 132* 133*  K 5.8* 5.4*  CL 98  --   CO2 22  --   GLUCOSE 90  --   BUN 84*  --   CREATININE 7.89*  --   CALCIUM 7.8*  --    Liver Function Tests: Recent Labs  Lab 01/22/21 0442  AST 47*  ALT 26  ALKPHOS 79  BILITOT 1.0  PROT 6.8  ALBUMIN 3.3*   CBC: Recent Labs  Lab 01/22/21 0442 01/22/21 0503  WBC 9.0  --   HGB 15.0 16.0  HCT 46.9 47.0  MCV 94.2  --   PLT 289  --    CBG: Recent Labs  Lab 01/22/21 0428 01/22/21 0556  GLUCAP 71 92    Studies/Results: DG Chest Port 1 View  Result Date: 01/22/2021 CLINICAL DATA:  Hypoxia.  Found unresponsive and hypoglycemic. EXAM: PORTABLE CHEST 1 VIEW COMPARISON:  01/05/2021 FINDINGS: Hazy appearance of the bilateral chest with interstitial coarsening. The right chest is more affected than the left but findings are bilateral. Borderline heart size. Negative aortic and hilar contours. No visible effusion or pneumothorax. IMPRESSION: Asymmetric edema versus right-sided infiltrate (which could be aspiration related). Electronically Signed   By: Monte Fantasia M.D.   On: 01/22/2021 04:56    Outpatient Dialysis Orders: PD, 5 exchanges overnight, 3L fill volume, all 2.5% dextrose, no daytime exchanges   Assessment/Plan: 1.  Hypoglycemia/type I diabetes: Per primary team. Blood sugars now improved. On SSI.  2.  ESRD:  On PD, see below.  3.  Hypercarbic respiratory failure: Missed PD last night and is volume overloaded on chest x-ray and exam. Will plan for 3 exchanges during the day today then regular 5 exchanges overnight, using half 2.% dextrose and half 4.25%. Patient also received lasix in the ED. Reports taking torsemide at home.  Will continue to monitor output/volume status. 4. Hypertension: Blood pressures currently well controlled, continue home meds. Monitor BP with volume removal.  5. Possible pneumonia: Possible R infiltrate on chest x-ray, on antibiotics per primary team.  6. Hyperkalemia: Likely 2/2 missed PD last night. Given a dose of kayexalate. K+ 5.4, will improve further with dialysis today.   7.  Anemia: Hemoglobin 16. No ESA indicated.  8.  Metabolic bone disease: Corrected calcium controlled. Continue sensipar, calcitriol and auryxia once tolerating PO.    Anice Paganini, PA-C 01/22/2021, 8:08 AM  [facility] Pager: [phone]

## 2021-01-22 NOTE — H&P (Incomplete)
NAME:  [name], MRN:  [id number], DOB:  24-Mar-1971, LOS: 0 ADMISSION DATE:  01/22/2021, CONSULTATION DATE:  01/22/21 REFERRING MD:  EDP, CHIEF COMPLAINT:   Altered mental status  History of Present Illness:  [name] is a 50 year old male with past medical history of type 1 diabetes and end-stage renal disease on peritoneal dialysis, hypertension, hyperlipidemia, on CPAP obesity who presented after wife found him unconscious.  His initial reported blood sugar was 51 EMS treated with D10 and mental status improved.  Patient was also hypoxic in the 70s on room air which improved with nonrebreather.    In the ED, patient was not initially tachycardic or hypotensive but was noted to be hypothermic with temp of 90 36F requiring 15 L nonrebreather.  Bair hugger was placed and his mental status improved and patient was conversant.  ABG with respiratory acidosis pH 7.23 PCO2 62.7 and potassium 5.4.  CXR with interstitial coarsening right greater than left.  He was given fluids and Zosyn initially then then Lasix and Kayexalate and started on BiPAP.  PCCM consulted for admission    Epic notes, patient was hospitalized at [facility] 4/4 - 02/04/6643 for metabolic encephalopathy secondary to hypoglycemia  Pertinent  Medical History   has a past medical history of ESRD (end stage renal disease) ([place]), Gastroparesis, Herniated cervical disc, HTN (hypertension), Hypercholesteremia, Morbid obesity ([place]), Peritoneal dialysis status ([place]), PONV (postoperative nausea and vomiting), S/P cardiac cath, Sinus tachycardia, TIA (transient ischemic attack), and Type 1 diabetes mellitus ([place]).   Significant Hospital Events: Including procedures, antibiotic start and stop dates in addition to other pertinent events   . 4/21 admit to PCCM, Zosyn continued, was started on BiPAP  Interim History / Subjective:  As above, improving mental status since arrival  Objective   Blood pressure 132/78, pulse 69,  temperature (!) 93.5 F (34.2 C), temperature source Rectal, resp. rate 15, height 5\' 11"  (1.803 m), weight 117.9 kg, SpO2 100 %.       No intake or output data in the 24 hours ending 01/22/21 0614 Filed Weights   01/22/21 0430  Weight: 117.9 kg   General:   HEENT: MM pink/moist Neuro:  CV: s1s2 ***, no m/r/g PULM:  *** GI: soft, bsx4 active  Extremities: warm/dry, *** edema  Skin: no rashes or lesions   Labs/imaging that I havepersonally reviewed  (right click and "Reselect all SmartList Selections" daily)  CBC BMP CXR  Resolved Hospital Problem list     Assessment & Plan:    Altered mental status likely secondary to hypoglycemia in the setting of type 1 diabetes Unclear what precipitated his hypoglycemia, possibly developing infection per notes had a similar episode a couple of weeks ago and required hospitalization at [facility].  Uses an insulin pump P: -Close monitoring of glucose and mental status, hold home Humalog for now -SSI  -Hemoglobin A1c -Follow UA    Hypercarbic respiratory failure, possible developing pneumonia Baseline OSA on CPAP with possible pulmonary edema versus developing infiltrate P: -Received 80 mg Lasix in the ED -Continue BiPAP, follow repeat ABG -BNP 89 continue home diuresis -Received Zosyn in the ED can likely continue with CAP coverage with azithromycin and Rocephin as no leukocytosis or lactic acidosis  ESRD, hyperkalemia Nephrology consulted by the ED P: -K 5.4, started on Kayexalate -Appreciate nephrology recommendations -Monitor metabolic panel   Hypertension, hyperlipidemia Last echo in 2020 with EF of 70 to 75% and hyperdynamic LV function P: -On multiple antihypertensives-resume Norvasc,  clonidine, Coreg , hydralazine, torsemide, Lipitor and Lasix daily -May benefit from repeat echo  Hypothyroidism -Continue Synthroid  Best practice (right click and "Reselect all SmartList Selections" daily)  Diet:   Oral Pain/Anxiety/Delirium protocol (if indicated): No VAP protocol (if indicated): Not indicated DVT prophylaxis: LMWH GI prophylaxis: N/A Glucose control:  SSI Yes Central venous access:  N/A Arterial line:  N/A Foley:  N/A Mobility:  bed rest  PT consulted: N/A Last date of multidisciplinary goals of care discussion (Due 4/28) Code Status:  full code Disposition: ICU  Labs   CBC: Recent Labs  Lab 01/22/21 0442 01/22/21 0503  WBC 9.0  --   HGB 15.0 16.0  HCT 46.9 47.0  MCV 94.2  --   PLT 289  --     Basic Metabolic Panel: Recent Labs  Lab 01/22/21 0442 01/22/21 0503  NA 132* 133*  K 5.8* 5.4*  CL 98  --   CO2 22  --   GLUCOSE 90  --   BUN 84*  --   CREATININE 7.89*  --   CALCIUM 7.8*  --    GFR: Estimated Creatinine Clearance: 14.9 mL/min (A) (by C-G formula based on SCr of 7.89 mg/dL (H)). Recent Labs  Lab 01/22/21 0442  WBC 9.0  LATICACIDVEN 0.9    Liver Function Tests: Recent Labs  Lab 01/22/21 0442  AST 47*  ALT 26  ALKPHOS 79  BILITOT 1.0  PROT 6.8  ALBUMIN 3.3*   No results for input(s): LIPASE, AMYLASE in the last 168 hours. No results for input(s): AMMONIA in the last 168 hours.  ABG    Component Value Date/Time   HCO3 26.7 01/22/2021 0503   TCO2 29 01/22/2021 0503   ACIDBASEDEF 2.0 01/22/2021 0503   O2SAT 50.0 01/22/2021 0503     Coagulation Profile: No results for input(s): INR, PROTIME in the last 168 hours.  Cardiac Enzymes: No results for input(s): CKTOTAL, CKMB, CKMBINDEX, TROPONINI in the last 168 hours.  HbA1C: Hemoglobin A1C  Date/Time Value Ref Range Status  10/14/2020 12:00 AM 13.3  Final   Hgb A1c MFr Bld  Date/Time Value Ref Range Status  01/23/2020 02:59 PM 10.5 (H) 4.8 - 5.6 % Final    Comment:    (NOTE) Pre diabetes:          5.7%-6.4% Diabetes:              >6.4% Glycemic control for   <7.0% adults with diabetes   09/27/2019 02:00 AM 10.2 (H) 4.8 - 5.6 % Final    Comment:    (NOTE)          Prediabetes: 5.7 - 6.4         Diabetes: >6.4         Glycemic control for adults with diabetes: <7.0     CBG: Recent Labs  Lab 01/22/21 0428 01/22/21 0556  GLUCAP 71 92    Review of Systems:   Positive for shortness of breath, negative except as noted in HPI Review of Systems  Constitutional: Negative for chills and fever.  Cardiovascular: Negative for chest pain and palpitations.     Past Medical History:  He,  has a past medical history of ESRD (end stage renal disease) ([place]), Gastroparesis, Herniated cervical disc, HTN (hypertension), Hypercholesteremia, Morbid obesity ([place]), Peritoneal dialysis status ([place]), PONV (postoperative nausea and vomiting), S/P cardiac cath, Sinus tachycardia, TIA (transient ischemic attack), and Type 1 diabetes mellitus ([place]).   Surgical History:   Past Surgical History:  Procedure Laterality Date  . BIOPSY  10/03/2019   Procedure: BIOPSY;  Surgeon: Mauri Pole, MD;  Location: [facility];  Service: Endoscopy;;  . cervical neck fusion    . COLONOSCOPY WITH PROPOFOL N/A 01/02/2021   Procedure: COLONOSCOPY WITH PROPOFOL;  Surgeon: [name], MD;  Location: [facility];  Service: Endoscopy;  Laterality: N/A;  priority 4 DIABETIC needs potassium draw  . ESOPHAGOGASTRODUODENOSCOPY (EGD) WITH PROPOFOL N/A 10/03/2019   Procedure: ESOPHAGOGASTRODUODENOSCOPY (EGD) WITH PROPOFOL;  Surgeon: Mauri Pole, MD;  Location: [facility];  Service: Endoscopy;  Laterality: N/A;  . HERNIA REPAIR     bilateral  . LEFT HEART CATHETERIZATION WITH CORONARY ANGIOGRAM N/A 12/18/2013   Procedure: LEFT HEART CATHETERIZATION WITH CORONARY ANGIOGRAM;  Surgeon: [name] M Martinique, MD;  Location: [facility] C[facility] LAB;  Service: Cardiovascular;  Laterality: N/A;  . POLYPECTOMY  01/02/2021   Procedure: POLYPECTOMY;  Surgeon: [name], MD;  Location: [facility];  Service: Endoscopy;;     Social History:   reports that he has never smoked. He has never  used smokeless tobacco. He reports that he does not drink alcohol and does not use drugs.   Family History:  His family history includes Coronary artery disease in an other family member; Diabetes in an other family member; Hypertension in an other family member; Stroke in an other family member. There is no history of Prostate cancer, Bladder Cancer, or Kidney cancer.   Allergies Allergies  Allergen Reactions  . Sulfa Antibiotics Anaphylaxis and Swelling    Pt is not allergic to iodine, has had iodine in the past w/o premeds and w/ no problems  . Oxycodone Nausea And Vomiting  . Oxycontin [Oxycodone Hcl] Nausea And Vomiting  . Penicillins Other (See Comments)    Possible reaction many years ago per patient (Pt reports he has had pcn without issues since time of "reaction") Other reaction(s): Unknown  . Prednisone Other (See Comments)    Patient is diabetic, runs sugar up Other reaction(s): Other (See Comments), Other (See Comments) Patient is diabetic, runs sugar up Patient is diabetic, runs sugar up Patient is diabetic, runs sugar up   . Shellfish-Derived Products Swelling    Eyes swell with shellfish Pt is not allergic to iodine, has had iodine in the past w/o premeds and w/ no problem     Home Medications  Prior to Admission medications   Medication Sig Start Date End Date Taking? Authorizing Provider  acetaminophen (TYLENOL) 500 MG tablet Take 500-1,000 mg by mouth every 6 (six) hours as needed for mild pain or fever.    [provider]  amLODipine (NORVASC) 10 MG tablet Take 1 tablet (10 mg total) by mouth daily. 10/04/19   Swayze, Ava, DO  amLODipine (NORVASC) 10 MG tablet TAKE 1 TABLET BY MOUTH ONCE DAILY 11/07/20 11/07/21  Anthonette Legato, MD  amoxicillin-clavulanate (AUGMENTIN) 500-125 MG tablet TAKE 1 TABLET BY MOUTH ONCE AS DIRECTED 12/31/20 12/31/21  Holley Raring, Mun[place], MD  atorvastatin (LIPITOR) 80 MG tablet Take 80 mg by mouth every evening.     [provider]  atorvastatin (LIPITOR) 80 MG tablet TAKE 1 TABLET BY MOUTH ONCE DAILY 10/24/20 10/24/21  Anthonette Legato, MD  atorvastatin (LIPITOR) 80 MG tablet TAKE 1 TABLET BY MOUTH ONCE DAILY 11/14/19 11/13/20  Judi Cong, MD  calcitRIOL (ROCALTROL) 0.25 MCG capsule Take by mouth. 02/29/20 02/28/21  [provider]  carvedilol (COREG) 25 MG tablet Take 25 mg by mouth 2 (two) times daily with  a meal.    [provider]  carvedilol (COREG) 25 MG tablet TAKE 1 TABLET BY MOUTH TWO TIMES DAILY WITH MEALS 04/02/20 04/02/21  Holley Raring, Munso[place], MD  cinacalcet (SENSIPAR) 30 MG tablet Take 30 mg by mouth daily. 11/26/20   [provider]  cinacalcet (SENSIPAR) 60 MG tablet Take 1 tablet (60 mg total) by mouth daily. 01/13/21   Anthonette Legato, MD  cloNIDine (CATAPRES) 0.1 MG tablet  11/26/20   [provider]  cloNIDine (CATAPRES) 0.1 MG tablet TAKE 1 TABLET BY MOUTH THREE TIMES DAILY 11/26/20 11/26/21  Anthonette Legato, MD  Continuous Blood Gluc Sensor (FREESTYLE LIBRE 2 SENSOR) MISC APPLY EVERY 14 DAYS. 12/01/20 12/01/21  Elayne Snare, MD  ferric citrate (AURYXIA) 1 GM 210 MG(Fe) tablet TAKE 3 TABLETS BY MOUTH WITH MEALS AND 1 TABLET WITH SNACKS. DON'T EXCEED 11 TABLETS DAILY. 12/23/20 12/23/21  Anthonette Legato, MD  ferric citrate (AURYXIA) 1 GM 210 MG(Fe) tablet TAKE 2 TABLETS BY MOUTH WITH MEALS AND TAKE 1 TABLET BY MOUTH WITH SNACKS 08/22/20 08/22/21  Lateef, Munsoor, MD  furosemide (LASIX) 40 MG tablet TAKE 3 TABLETS BY MOUTH DAILY 12/23/20 12/23/21  Holley Raring, Munso[place], MD  furosemide (LASIX) 80 MG tablet Take 80 mg by mouth daily as needed for fluid or edema.    [provider]  furosemide (LASIX) 80 MG tablet TAKE 1 TABLET BY MOUTH 1 TIME EACH DAY 10/22/20 10/22/21  Lateef, Munsoor, MD  HUMALOG 100 UNIT/ML injection USE UP TO 100 UNITS TOTAL VIA INSULIN PUMP DAILY 12/25/20 12/25/21  Renato Shin, MD  hydrALAZINE (APRESOLINE) 25 MG tablet TAKE 1 TABLET BY MOUTH 3 TIMES DAILY 11/07/20 11/07/21   Holley Raring, Munsoor, MD  insulin lispro (HUMALOG) 100 UNIT/ML injection USE VIA INSULIN PUMP UP TO 80 UNITS PER DAY 11/14/19 11/13/20  Judi Cong, MD  levothyroxine (SYNTHROID) 25 MCG tablet Take 1 tablet (25 mcg total) by mouth Daily at 0600. 01/07/21     losartan (COZAAR) 100 MG tablet Take 100 mg by mouth daily. 11/10/20   [provider]  losartan (COZAAR) 100 MG tablet TAKE 1 TABLET BY MOUTH ONCE DAILY 11/07/20 11/07/21  Anthonette Legato, MD  Na Sulfate-K Sulfate-Mg Sulf 17.5-3.13-1.6 GM/177ML SOLN USE AS DIRECTED 12/26/20 12/26/21  [name], MD  torsemide (DEMADEX) 100 MG tablet Take 1 tablet (100 mg total) by mouth daily. 01/07/21     torsemide (DEMADEX) 100 MG tablet Take 1 tablet (100 mg total) by mouth 2 (two) times daily. 01/13/21   Lateef, Munsoor, MD  triamcinolone ointment (KENALOG) 0.1 % APPLY TOPICALLY 2 TIMES A DAY 10/22/20 10/22/21  Lateef, Munsoor, MD  promethazine (PHENERGAN) 25 MG tablet Take 1/2 (12.5mg ) to 1 (25mg ) tab as needed for nausea and vomiting not controlled with Zofran up to every 6 hours. 09/30/20 12/04/20  Orma Render, NP     Critical care time: ***

## 2021-01-22 NOTE — Progress Notes (Signed)
Pt arrived to unit on [address]. Pt is awake and alert and WOB WNL. Pt remains off BIPAP at this time. RT to continue to monitor.

## 2021-01-22 NOTE — Progress Notes (Signed)
[facility] Progress Note Patient Name: HERNANDO REALI DOB: 1970/11/08 MRN: [id number]   Date of Service  01/22/2021  HPI/Events of Note  Hypertension - BP = 203/80.  eICU Interventions  Plan: 1. Hydralazine 10 mg IV Q 4 hours PRN SBP > 170 or DBP > 100.     Intervention Category Major Interventions: Hypertension - evaluation and management  Lysle Dingwall 01/22/2021, 8:42 PM

## 2021-01-23 DIAGNOSIS — J690 Pneumonitis due to inhalation of food and vomit: Secondary | ICD-10-CM

## 2021-01-23 DIAGNOSIS — T68XXXA Hypothermia, initial encounter: Secondary | ICD-10-CM

## 2021-01-23 LAB — GLUCOSE, CAPILLARY
Glucose-Capillary: 104 mg/dL — ABNORMAL HIGH (ref 70–99)
Glucose-Capillary: 118 mg/dL — ABNORMAL HIGH (ref 70–99)
Glucose-Capillary: 121 mg/dL — ABNORMAL HIGH (ref 70–99)
Glucose-Capillary: 124 mg/dL — ABNORMAL HIGH (ref 70–99)
Glucose-Capillary: 127 mg/dL — ABNORMAL HIGH (ref 70–99)
Glucose-Capillary: 134 mg/dL — ABNORMAL HIGH (ref 70–99)
Glucose-Capillary: 142 mg/dL — ABNORMAL HIGH (ref 70–99)
Glucose-Capillary: 148 mg/dL — ABNORMAL HIGH (ref 70–99)
Glucose-Capillary: 167 mg/dL — ABNORMAL HIGH (ref 70–99)
Glucose-Capillary: 171 mg/dL — ABNORMAL HIGH (ref 70–99)
Glucose-Capillary: 194 mg/dL — ABNORMAL HIGH (ref 70–99)
Glucose-Capillary: 207 mg/dL — ABNORMAL HIGH (ref 70–99)
Glucose-Capillary: 220 mg/dL — ABNORMAL HIGH (ref 70–99)
Glucose-Capillary: 221 mg/dL — ABNORMAL HIGH (ref 70–99)
Glucose-Capillary: 82 mg/dL (ref 70–99)

## 2021-01-23 LAB — BASIC METABOLIC PANEL
Anion gap: 15 (ref 5–15)
BUN: 83 mg/dL — ABNORMAL HIGH (ref 6–20)
CO2: 20 mmol/L — ABNORMAL LOW (ref 22–32)
Calcium: 7.3 mg/dL — ABNORMAL LOW (ref 8.9–10.3)
Chloride: 99 mmol/L (ref 98–111)
Creatinine, Ser: 8.09 mg/dL — ABNORMAL HIGH (ref 0.61–1.24)
GFR, Estimated: 8 mL/min — ABNORMAL LOW (ref >60)
Glucose, Bld: 167 mg/dL — ABNORMAL HIGH (ref 70–99)
Potassium: 3.9 mmol/L (ref 3.5–5.1)
Sodium: 134 mmol/L — ABNORMAL LOW (ref 135–145)

## 2021-01-23 LAB — HEPATITIS B SURFACE ANTIGEN: Hepatitis B Surface Ag: NONREACTIVE

## 2021-01-23 MED ORDER — HEPARIN SODIUM (PORCINE) 5000 UNIT/ML IJ SOLN
5000.0000 [IU] | Freq: Three times a day (TID) | INTRAMUSCULAR | Status: DC
  Administered 2021-01-24: 5000 [IU] via SUBCUTANEOUS
  Filled 2021-01-23: qty 1

## 2021-01-23 MED ORDER — MELATONIN 5 MG PO TABS
5.0000 mg | ORAL_TABLET | Freq: Every evening | ORAL | Status: DC | PRN
  Administered 2021-01-23: 5 mg via ORAL
  Filled 2021-01-23 (×2): qty 1

## 2021-01-23 MED ORDER — GABAPENTIN 100 MG PO CAPS
100.0000 mg | ORAL_CAPSULE | Freq: Three times a day (TID) | ORAL | Status: DC
  Administered 2021-01-23 – 2021-01-24 (×3): 100 mg via ORAL
  Filled 2021-01-23 (×3): qty 1

## 2021-01-23 MED ORDER — CLONIDINE HCL 0.1 MG PO TABS: 0.1000 mg | ORAL_TABLET | Freq: Every day | ORAL | Status: DC

## 2021-01-23 MED ORDER — GABAPENTIN 100 MG PO CAPS
100.0000 mg | ORAL_CAPSULE | Freq: Once | ORAL | Status: AC
  Administered 2021-01-23: 100 mg via ORAL
  Filled 2021-01-23: qty 1

## 2021-01-23 MED ORDER — ACETAMINOPHEN 325 MG PO TABS
650.0000 mg | ORAL_TABLET | Freq: Once | ORAL | Status: AC
  Administered 2021-01-23: 650 mg via ORAL
  Filled 2021-01-23: qty 2

## 2021-01-23 MED ORDER — ACETAMINOPHEN 325 MG PO TABS: 650.0000 mg | ORAL_TABLET | Freq: Four times a day (QID) | ORAL | Status: DC | PRN

## 2021-01-23 MED ORDER — INSULIN PUMP
SUBCUTANEOUS | Status: DC
  Filled 2021-01-23: qty 1

## 2021-01-23 MED ORDER — MORPHINE SULFATE (PF) 2 MG/ML IV SOLN
2.0000 mg | Freq: Once | INTRAVENOUS | Status: AC
  Administered 2021-01-23: 2 mg via INTRAVENOUS
  Filled 2021-01-23: qty 1

## 2021-01-23 NOTE — Progress Notes (Signed)
Pharmacy Antibiotic Note  [name] is a 50 y.o. male admitted on 01/22/2021 with hypoglycemia.  Patient was started Zosyn then switched to Levaquin for aspiration pneumonia vs CAP.  Low suspicion for aspiration pneumonia, WBC wnl, afebrile. CXR yesterday with asymmetric edema vs right-sided infiltrate.  Discussed with Dr. Chase Caller, plan to stop Levaquin and monitor off antibiotics.  Fara Olden, PharmD PGY-1 Pharmacy Resident 01/23/2021 10:12 AM Please see AMION for all pharmacy numbers

## 2021-01-23 NOTE — Progress Notes (Signed)
Patient taken to 82m10 with all belongings and both cell phones.  Nurse, Joelene Millin present in hallway and aware of transfer and patient arrival.  Patient assisted to bed. Ambulated without difficulty. Bed alarm placed. Call bell within reach. Bedside table in reach.  38M Charge RN aware of patient arrival as well.

## 2021-01-23 NOTE — Progress Notes (Signed)
RT asked pt about the CPAP and he said he does not wear at home either only when needed. He refused to wear CPAP tonight.

## 2021-01-23 NOTE — Progress Notes (Signed)
Inpatient Diabetes Program Recommendations  AACE/ADA: New Consensus Statement on Inpatient Glycemic Control (2015)  Target Ranges:  Prepandial:   less than 140 mg/dL      Peak postprandial:   less than 180 mg/dL (1-2 hours)      Critically ill patients:  140 - 180 mg/dL   Lab Results  Component Value Date   GLUCAP 194 (H) 01/23/2021   HGBA1C 8.1 (H) 01/22/2021    Insulin pump restarted and infusing left lower abd at 1144 am. Overlap 1 hour with IV insulin then d/c IV insulin. Lowered midnight rate. Will need adjustments in the next few days with rates in the evenings when glucose trends increase with PD dialysate fluids. Pt to message Dwyane Dee as requested for guidance.  Thanks,  Tama Headings RN, MSN, BC-ADM Inpatient Diabetes Coordinator Team Pager [phone] (8a-5p)

## 2021-01-23 NOTE — Progress Notes (Signed)
eLink Physician-Brief Progress Note Patient Name: [name] DOB: 03-23-1971 MRN: [id number]   Date of Service  01/23/2021  HPI/Events of Note  Patient c/o pain in his feet. AST = 47 (mildly elevated).  eICU Interventions  Plan: 1. Tylenol 650 mg PO X 1 now.      Intervention Category Major Interventions: Other:  Lysle Dingwall 01/23/2021, 5:41 AM

## 2021-01-23 NOTE — Progress Notes (Addendum)
[facility] Progress Note  Subjective: seen in ICU, UF w/ PD yest daytime was about 1.5 L.  Didn't get overnight PD as intended.   Vitals:   01/23/21 1000 01/23/21 1100 01/23/21 1200 01/23/21 1208  BP:      Pulse: 82 75 78   Resp: 13 14 (!) 21   Temp:    98.2 F (36.8 C)  TempSrc:    Oral  SpO2: 96% 94% 98%   Weight:      Height:        Exam:  alert, nad on RA  no jvd  Chest cta bilat  Cor reg no RG  Abd soft ntnd no ascites   Ext improved ankle edema   Alert, NF, ox3    PD cath in mid abd    OP PD: 5 exchanges overnight, 3L fill volume, all 2.5% usually, no daytime exchanges   Assessment/ Plan: 1.  Hypoglycemia/type I diabetes: Per primary team. Blood sugars now improved. On SSI.  2.  ESRD: gets CCPD at home. Same here. Doing daytime PD today, will hold off on nighttime PD tonight. Resume PD 4/23 pm.  3.  Hypercarbic respiratory failure: due to vol overload/ pulm edema. Improving w/ all 2.5% PD yesterday and again today. Wt's down and breathing better. . 4. Hypertension: Blood pressures currently well controlled, continue home meds. Monitor BP with volume removal.  5. Possible pneumonia: Possible R infiltrate on chest x-ray, on antibiotics per primary team.  6. Hyperkalemia: Likely 2/2 missed PD last night. Given a dose of kayexalate. Resolved, down to 3.9 today.  7.  Anemia: Hemoglobin 16. No ESA indicated.  8.  Metabolic bone disease: Corrected calcium controlled. Continue sensipar, calcitriol and auryxia once tolerating PO.      Rob Mando Blatz 01/23/2021, 12:46 PM   Recent Labs  Lab 01/22/21 0442 01/22/21 0503 01/23/21 0025  K 5.8* 5.4* 3.9  BUN 84*  --  83*  CREATININE 7.89*  --  8.09*  CALCIUM 7.8*  --  7.3*  HGB 15.0 16.0  --    Inpatient medications: . amLODipine  10 mg Oral Daily  . Chlorhexidine Gluconate Cloth  6 each Topical Daily  . [START ON 01/24/2021] cloNIDine  0.1 mg Oral QHS  . gentamicin cream  1 application Topical Daily   . [START ON 01/24/2021] heparin injection (subcutaneous)  5,000 Units Subcutaneous Q8H  . insulin pump   Subcutaneous Q4H   . dialysis solution 2.5% low-MG/low-CA    . dialysis solution 4.25% low-MG/low-CA     acetaminophen, dextrose, docusate sodium, dianeal solution for CAPD/CCPD with heparin, melatonin, ondansetron (ZOFRAN) IV, polyethylene glycol

## 2021-01-23 NOTE — Progress Notes (Signed)
NAME:  [name], MRN:  [id number], DOB:  02-Oct-1971, LOS: 0 ADMISSION DATE:  01/22/2021, CONSULTATION DATE:  01/22/21 REFERRING MD:  EDP, CHIEF COMPLAINT:   Altered mental status  History of Present Illness:  [name] is a 50 year old male with past medical history of type 1 diabetes and end-stage renal disease on peritoneal dialysis, hypertension, hyperlipidemia, on CPAP, obesity admitted after being found unresponsive in setting of blood sugar was 51. Mental status improved with improvement in BG.   Epic notes, patient was hospitalized at [facility] 4/4 - 04/05/2201 for metabolic encephalopathy secondary to hypoglycemia  Pertinent  Medical History   has a past medical history of ESRD (end stage renal disease) ([place]), Gastroparesis, Herniated cervical disc, HTN (hypertension), Hypercholesteremia, Morbid obesity ([place]), Peritoneal dialysis status ([place]), PONV (postoperative nausea and vomiting), S/P cardiac cath, Sinus tachycardia, TIA (transient ischemic attack), and Type 1 diabetes mellitus ([place]).   Significant Hospital Events: Including procedures, antibiotic start and stop dates in addition to other pertinent events    4/21 admit to PCCM, Zosyn continued, was started on BiPAP  Interim History / Subjective:  -2.7 L with PD, hypothermia improved. Oxygenating well on room air. Hemodynamics stable, started on antihypertensives overnight. Patient alert and oriented, non drowsy, complaints of significant peripheral neuropathy. Plans for multiple runs of PD today.   Objective   Blood pressure (!) 141/77, pulse 87, temperature 98 F (36.7 C), temperature source Oral, resp. rate 20, height 5\' 11"  (1.803 m), weight 115.3 kg, SpO2 99 %.        Intake/Output Summary (Last 24 hours) at 01/23/2021 0839 Last data filed at 01/23/2021 0600 Gross per 24 hour  Intake 9184.72 ml  Output 11977 ml  Net -2792.28 ml   Filed Weights   01/22/21 0430 01/23/21 0325 01/23/21 0713   Weight: 117.9 kg 116.4 kg 115.3 kg    Examination: General: Adult male resting in bed, alert and oriented, no distress.  HENT: AT/[place], PERRL Lungs: Lung sounds clear, non labored.  Cardiovascular: RRR, trace edema Abdomen: obese, nd, nt, PD in place.  Extremities: + edema bilaterally, no c/c Neuro: Alert and oriented, moving all extremities equally, no focal deficits.   Labs/imaging: I havepersonally reviewed  (right click and "Reselect all SmartList Selections" daily)    Resolved Hospital Problem list   hypoglycemia  Assessment & Plan:  t1dm with hypoglycemic event:  -A1c: 8.1 -Insulin pump off, diabetes coordinator following: recent episodes of hypoglycemia as outpatient and follows with endocrine. They have ordered insulin pump with hypoglycemia alarm but will not be here for another week. Until then, patient will transition to insulin pump and come off insulin drip today.  Will need to be monitored on pump prior to discharge.   Acute metabolic encephalopathy: RESOLVED -2/2 hypoglycemia  Acute hypoxic/hypercarbic resp failure: RESOLVED -reportedly 70's on RA   Aspiration pneumonia:  -monitor off abx  OSA -Hospital Bipap available, recommend brining in home machine  Esrd:  -Followed by [facility], on peritoneal dialysis -Ongoing PD per Nephrology team  Hyperkalemia:  -resolved with medical management and PD  Htn Hyperlipidemia Hypothyroidism -Home norvasc, clonidine.  Resume cozaar, hydralazine and coreg as blood pressure tolerates.   Peripheral neuropathy -Will give low dose gabapentin x 1  Best practice (right click and "Reselect all SmartList Selections" daily)  Diet:  Oral Pain/Anxiety/Delirium protocol (if indicated): No VAP protocol (if indicated): Not indicated DVT prophylaxis: LMWH GI prophylaxis: N/A Glucose control:  SSI Yes Central venous access:  N/A Arterial  line:  N/A Foley:  N/A Mobility:  OOB  PT consulted: N/A Last date of  multidisciplinary goals of care discussion Patient alert and oriented, updated.  Code Status:  full code Disposition: ICU. Able to transfer once insulin pump placed.   Labs   CBC: Recent Labs  Lab 01/22/21 0442 01/22/21 0503  WBC 9.0  --   HGB 15.0 16.0  HCT 46.9 47.0  MCV 94.2  --   PLT 289  --     Basic Metabolic Panel: Recent Labs  Lab 01/22/21 0442 01/22/21 0503 01/23/21 0025  NA 132* 133* 134*  K 5.8* 5.4* 3.9  CL 98  --  99  CO2 22  --  20*  GLUCOSE 90  --  167*  BUN 84*  --  83*  CREATININE 7.89*  --  8.09*  CALCIUM 7.8*  --  7.3*   GFR: Estimated Creatinine Clearance: 14.4 mL/min (A) (by C-G formula based on SCr of 8.09 mg/dL (H)). Recent Labs  Lab 01/22/21 0442  WBC 9.0  LATICACIDVEN 0.9    Liver Function Tests: Recent Labs  Lab 01/22/21 0442  AST 47*  ALT 26  ALKPHOS 79  BILITOT 1.0  PROT 6.8  ALBUMIN 3.3*   No results for input(s): LIPASE, AMYLASE in the last 168 hours. No results for input(s): AMMONIA in the last 168 hours.  ABG    Component Value Date/Time   HCO3 26.7 01/22/2021 0503   TCO2 29 01/22/2021 0503   ACIDBASEDEF 2.0 01/22/2021 0503   O2SAT 50.0 01/22/2021 0503     Coagulation Profile: No results for input(s): INR, PROTIME in the last 168 hours.  Cardiac Enzymes: No results for input(s): CKTOTAL, CKMB, CKMBINDEX, TROPONINI in the last 168 hours.  HbA1C: Hemoglobin A1C  Date/Time Value Ref Range Status  10/14/2020 12:00 AM 13.3  Final   Hgb A1c MFr Bld  Date/Time Value Ref Range Status  01/22/2021 11:27 AM 8.1 (H) 4.8 - 5.6 % Final    Comment:    (NOTE) Pre diabetes:          5.7%-6.4%  Diabetes:              >6.4%  Glycemic control for   <7.0% adults with diabetes   01/23/2020 02:59 PM 10.5 (H) 4.8 - 5.6 % Final    Comment:    (NOTE) Pre diabetes:          5.7%-6.4% Diabetes:              >6.4% Glycemic control for   <7.0% adults with diabetes     CBG: Recent Labs  Lab 01/23/21 0330  01/23/21 0422 01/23/21 0524 01/23/21 0635 01/23/21 0756  GLUCAP 104* 121* 118* 127* 124*     [name] [facility] 01/23/2021, 8:39 AM Secure chat for coordination of care

## 2021-01-23 NOTE — Progress Notes (Signed)
[facility] Progress Note Patient Name: [name] DOB: 21-May-1971 MRN: [id number]   Date of Service  01/23/2021  HPI/Events of Note  Patient requests sleep aid.   eICU Interventions  Plan: 1. Melatonin 5 mg PO Q HS PRN sleep.      Intervention Category Major Interventions: Other:  Lysle Dingwall 01/23/2021, 12:27 AM

## 2021-01-24 LAB — BASIC METABOLIC PANEL
Anion gap: 11 (ref 5–15)
BUN: 73 mg/dL — ABNORMAL HIGH (ref 6–20)
CO2: 25 mmol/L (ref 22–32)
Calcium: 7.5 mg/dL — ABNORMAL LOW (ref 8.9–10.3)
Chloride: 102 mmol/L (ref 98–111)
Creatinine, Ser: 8.46 mg/dL — ABNORMAL HIGH (ref 0.61–1.24)
GFR, Estimated: 7 mL/min — ABNORMAL LOW (ref >60)
Glucose, Bld: 96 mg/dL (ref 70–99)
Potassium: 4 mmol/L (ref 3.5–5.1)
Sodium: 138 mmol/L (ref 135–145)

## 2021-01-24 LAB — GLUCOSE, CAPILLARY
Glucose-Capillary: 106 mg/dL — ABNORMAL HIGH (ref 70–99)
Glucose-Capillary: 142 mg/dL — ABNORMAL HIGH (ref 70–99)
Glucose-Capillary: 78 mg/dL (ref 70–99)

## 2021-01-24 MED ORDER — POLYETHYLENE GLYCOL 3350 17 G PO PACK
17.0000 g | PACK | Freq: Every day | ORAL | 0 refills | Status: DC
Start: 2021-01-24 — End: 2021-10-06

## 2021-01-24 MED ORDER — CINACALCET HCL 30 MG PO TABS
60.0000 mg | ORAL_TABLET | Freq: Every day | ORAL | Status: DC
  Administered 2021-01-24: 60 mg via ORAL
  Filled 2021-01-24: qty 2

## 2021-01-24 MED ORDER — FERRIC CITRATE 1 GM 210 MG(FE) PO TABS
630.0000 mg | ORAL_TABLET | Freq: Three times a day (TID) | ORAL | Status: DC
  Administered 2021-01-24 (×2): 630 mg via ORAL
  Filled 2021-01-24 (×2): qty 3

## 2021-01-24 MED ORDER — CLONIDINE HCL 0.1 MG PO TABS: 0.1000 mg | ORAL_TABLET | Freq: Two times a day (BID) | ORAL | 3 refills | Status: DC

## 2021-01-24 MED ORDER — ATORVASTATIN CALCIUM 80 MG PO TABS
80.0000 mg | ORAL_TABLET | Freq: Every day | ORAL | Status: DC
  Administered 2021-01-24: 80 mg via ORAL
  Filled 2021-01-24: qty 1

## 2021-01-24 NOTE — Discharge Summary (Signed)
[facility] Discharge Summary   Patient: [name] TDV:[id number]  PCP: [name], MD  Date of admission: 01/22/2021   Date of discharge: 01/24/2021      Discharge Diagnoses:  Principal diagnosis Hypoglycemia  Active Problems:   Hypoglycemia type 1 DM ESRD on PD  Admitted From: home Disposition:  Home   Recommendations for Outpatient Follow-up:  1. PCP: please follow up in 1 week with PCP and follow up with Nephrology and Endocrine 2. Follow up LABS/TEST:  none 3. New Meds: none 4. Changed meds: none 5. Stopped meds: none   Follow-up Information    [name], MD. Schedule an appointment as soon as possible for a visit in 1 week(s).   Specialty: Internal Medicine Contact information: [place] [address] [phone]        Anthonette Legato, MD. Schedule an appointment as soon as possible for a visit in 1 week(s).   Specialty: Nephrology Contact information: [address] [place] [address] [phone]        Elayne Snare, MD Follow up.   Specialty: Endocrinology Contact information: [place] [address] [phone]              Discharge Instructions    Diet Carb Modified   Complete by: As directed    Increase activity slowly   Complete by: As directed    No wound care   Complete by: As directed       Diet recommendation: Carb modified diet  Activity: The patient is advised to gradually reintroduce usual activities, as tolerated  Discharge Condition: stable  Code Status: Full code   History of present illness: As per the H and P dictated on admission, "Mr. [name] is a54 year old male with past medical history of type 1 diabetes and end-stage renal disease on peritoneal dialysis, hypertension, hyperlipidemia, on CPAP obesity who presented after wife found him unconscious. His initial reported blood sugar was 51 EMS treated with D10 andmental status  improved.Patient was also hypoxic in the 70s on room air which improved with nonrebreather.   In the ED, patient was not initially tachycardic or hypotensive but was noted to be hypothermic with temp of 90 49F requiring 15 L nonrebreather.Bair hugger was placed and his mental status improved and patient was conversant. ABG with respiratory acidosis pH 7.23 PCO2 62.7and potassium 5.4.CXR with interstitial coarsening right greater than left.He was given fluids and Zosyn initially then then Lasix and Kayexalate and started on BiPAP. PCCM consulted for admission   Epic notes, patient was hospitalized at [facility] 4/4 - 05/04/174 for metabolic encephalopathy secondary to hypoglycemia"  Hospital Course:  Summary of his active problems in the hospital is as following.   Type 1 DM uncontrolled with hypoglycemia with renal complication and long acting insulin use -A1c: 8.1 uses Insulin pump, it was off,  diabetes coordinator following recent episodes of hypoglycemia as outpatient and follows with endocrine.  Basal rate adjusted by DM coordinator. Plan for outpatient follow up with endocrine   Acute metabolic encephalopathy: RESOLVED Due to hypoglycemia  Acute hypoxic/hypercarbic resp failure: RESOLVED -reportedly 70's on RA  Now on room air  Aspiration pneumonitis No evidence on infection  Monitor   OSA Continue home PAP therapy  Esrd:  -Followed by [facility], on peritoneal dialysis  Hyperkalemia:  -resolved with medical management and PD  Htn Hyperlipidemia Hypothyroidism Home norvasc, clonidine.  Resume cozaar,  Does not use  hydralazine Holding coreg as blood pressure normal without it  Leg cramps  Resolved  No indication for gabapentin for now   Obesity  Pt planning to lose weight. Will follow up with endocrinology  Body mass index is 35.45 kg/m.   Patient was ambulatory without any assistance. On the day of the discharge the  patient's vitals were stable, and no other acute medical condition were reported by patient. The patient was felt safe to be discharge at Home with no therapy needed on discharge.  Consultants: Nephrology, primary admission with PCCM  Procedures: none  DISCHARGE MEDICATION: Allergies as of 01/24/2021      Reactions   Sulfa Antibiotics Anaphylaxis, Swelling   Pt is not allergic to iodine, has had iodine in the past w/o premeds and w/ no problems   Oxycodone Nausea And Vomiting   Oxycontin [oxycodone Hcl] Nausea And Vomiting   Penicillins Other (See Comments)   Possible reaction many years ago per patient (Pt reports he has had pcn without issues since time of "reaction") Other reaction(s): Unknown   Prednisone Other (See Comments)   Patient is diabetic, runs sugar up Other reaction(s): Other (See Comments), Other (See Comments) Patient is diabetic, runs sugar up Patient is diabetic, runs sugar up Patient is diabetic, runs sugar up    Shellfish-derived Products Swelling   Eyes swell with shellfish Pt is not allergic to iodine, has had iodine in the past w/o premeds and w/ no problem      Medication List    STOP taking these medications   carvedilol 25 MG tablet Commonly known as: COREG   furosemide 40 MG tablet Commonly known as: LASIX   furosemide 80 MG tablet Commonly known as: LASIX   hydrALAZINE 25 MG tablet Commonly known as: APRESOLINE     TAKE these medications   acetaminophen 500 MG tablet Commonly known as: TYLENOL Take 500-1,000 mg by mouth every 6 (six) hours as needed for mild pain or fever.   amLODipine 10 MG tablet Commonly known as: NORVASC TAKE 1 TABLET BY MOUTH ONCE DAILY What changed: how much to take   atorvastatin 80 MG tablet Commonly known as: LIPITOR TAKE 1 TABLET BY MOUTH ONCE DAILY What changed: how much to take   Auryxia 1 GM 210 MG(Fe) tablet Generic drug: ferric citrate TAKE 3 TABLETS BY MOUTH WITH MEALS AND 1 TABLET WITH SNACKS.  DON'T EXCEED 11 TABLETS DAILY. What changed:   how much to take  how to take this  when to take this   calcitRIOL 0.25 MCG capsule Commonly known as: ROCALTROL Take 0.25 mcg by mouth daily.   cinacalcet 60 MG tablet Commonly known as: SENSIPAR Take 1 tablet (60 mg total) by mouth daily.   cloNIDine 0.1 MG tablet Commonly known as: CATAPRES Take 0.1 mg by mouth 2 (two) times daily. What changed: Another medication with the same name was removed. Continue taking this medication, and follow the directions you see here.   FreeStyle Libre 2 Sensor Misc APPLY EVERY 14 DAYS.   HumaLOG 100 UNIT/ML injection Generic drug: insulin lispro USE UP TO 100 UNITS TOTAL VIA INSULIN PUMP DAILY   losartan 100 MG tablet Commonly known as: COZAAR TAKE 1 TABLET BY MOUTH ONCE DAILY What changed: how much to take   polyethylene glycol 17 g packet Commonly known as: MiraLax Take 17 g by mouth daily.   Suprep Bowel Prep Kit 17.5-3.13-1.6 GM/177ML Soln Generic drug: Na Sulfate-K Sulfate-Mg Sulf USE AS DIRECTED   torsemide 100  MG tablet Commonly known as: DEMADEX Take 1 tablet (100 mg total) by mouth 2 (two) times daily. What changed: Another medication with the same name was removed. Continue taking this medication, and follow the directions you see here.   triamcinolone ointment 0.1 % Commonly known as: KENALOG APPLY TOPICALLY 2 TIMES A DAY What changed:   how much to take  how to take this  when to take this       Discharge Exam: Filed Weights   01/22/21 0430 01/23/21 0325 01/23/21 0713  Weight: 117.9 kg 116.4 kg 115.3 kg   Vitals:   01/24/21 0701 01/24/21 1100  BP: (!) 149/81 (!) 175/79  Pulse: 87 84  Resp: 18 18  Temp: 97.7 F (36.5 C) 98 F (36.7 C)  SpO2: 97% 96%   General: Appear in mild distress, no Rash; Oral Mucosa Clear, moist. no Abnormal Neck Mass Or lumps, Conjunctiva normal  Cardiovascular: S1 and S2 Present, no Murmur, Respiratory: good respiratory  effort, Bilateral Air entry present and CTA, no Crackles, no wheezes Abdomen: Bowel Sound present, Soft and no tenderness Extremities: no Pedal edema Neurology: alert and oriented to time, place, and person affect appropriate. no new focal deficit Gait not checked due to patient safety concerns    The results of significant diagnostics from this hospitalization (including imaging, microbiology, ancillary and laboratory) are listed below for reference.    Significant Diagnostic Studies: DG Chest Port 1 View  Result Date: 01/22/2021 CLINICAL DATA:  Hypoxia.  Found unresponsive and hypoglycemic. EXAM: PORTABLE CHEST 1 VIEW COMPARISON:  01/05/2021 FINDINGS: Hazy appearance of the bilateral chest with interstitial coarsening. The right chest is more affected than the left but findings are bilateral. Borderline heart size. Negative aortic and hilar contours. No visible effusion or pneumothorax. IMPRESSION: Asymmetric edema versus right-sided infiltrate (which could be aspiration related). Electronically Signed   By: Monte Fantasia M.D.   On: 01/22/2021 04:56    Microbiology: Recent Results (from the past 240 hour(s))  Culture, blood (single) w Reflex to ID Panel     Status: None (Preliminary result)   Collection Time: 01/22/21  5:01 AM   Specimen: BLOOD  Result Value Ref Range Status   Specimen Description BLOOD RIGHT ANTECUBITAL  Final   Special Requests   Final    BOTTLES DRAWN AEROBIC AND ANAEROBIC Blood Culture adequate volume   Culture   Final    NO GROWTH 2 DAYS Performed at [facility], [address] [place], [place] [address]    Report Status PENDING  Incomplete  Resp Panel by RT-PCR (Flu A&B, Covid) Nasopharyngeal Swab     Status: None   Collection Time: 01/22/21  6:07 AM   Specimen: Nasopharyngeal Swab; Nasopharyngeal(NP) swabs in vial transport medium  Result Value Ref Range Status   SARS Coronavirus 2 by RT PCR NEGATIVE NEGATIVE Final    Comment: (NOTE) SARS-CoV-2  target nucleic acids are NOT DETECTED.  The SARS-CoV-2 RNA is generally detectable in upper respiratory specimens during the acute phase of infection. The lowest concentration of SARS-CoV-2 viral copies this assay can detect is 138 copies/mL. A negative result does not preclude SARS-Cov-2 infection and should not be used as the sole basis for treatment or other patient management decisions. A negative result may occur with  improper specimen collection/handling, submission of specimen other than nasopharyngeal swab, presence of viral mutation(s) within the areas targeted by this assay, and inadequate number of viral copies(<138 copies/mL). A negative result must be combined with  clinical observations, patient history, and epidemiological information. The expected result is Negative.  Fact Sheet for Patients:  EntrepreneurPulse.com.au  Fact Sheet for Healthcare Providers:  IncredibleEmployment.be  This test is no t yet approved or cleared by the [place] FDA and  has been authorized for detection and/or diagnosis of SARS-CoV-2 by FDA under an Emergency Use Authorization (EUA). This EUA will remain  in effect (meaning this test can be used) for the duration of the COVID-19 declaration under Section 564(b)(1) of the Act, 21 U.S.C.section 360bbb-3(b)(1), unless the authorization is terminated  or revoked sooner.       Influenza A by PCR NEGATIVE NEGATIVE Final   Influenza B by PCR NEGATIVE NEGATIVE Final    Comment: (NOTE) The Xpert Xpress SARS-CoV-2/FLU/RSV plus assay is intended as an aid in the diagnosis of influenza from Nasopharyngeal swab specimens and should not be used as a sole basis for treatment. Nasal washings and aspirates are unacceptable for Xpert Xpress SARS-CoV-2/FLU/RSV testing.  Fact Sheet for Patients: EntrepreneurPulse.com.au  Fact Sheet for Healthcare  Providers: IncredibleEmployment.be  This test is not yet approved or cleared by the [place] FDA and has been authorized for detection and/or diagnosis of SARS-CoV-2 by FDA under an Emergency Use Authorization (EUA). This EUA will remain in effect (meaning this test can be used) for the duration of the COVID-19 declaration under Section 564(b)(1) of the Act, 21 U.S.C. section 360bbb-3(b)(1), unless the authorization is terminated or revoked.  Performed at [facility], [place] [address], [place], [place] [address]   MRSA PCR Screening     Status: None   Collection Time: 01/22/21  8:14 AM   Specimen: Nasopharyngeal  Result Value Ref Range Status   MRSA by PCR NEGATIVE NEGATIVE Final    Comment:        The GeneXpert MRSA Assay (FDA approved for NASAL specimens only), is one component of a comprehensive MRSA colonization surveillance program. It is not intended to diagnose MRSA infection nor to guide or monitor treatment for MRSA infections. Performed at [facility], [place] [address], [place], [place] [address]      Labs: CBC: Recent Labs  Lab 01/22/21 0442 01/22/21 0503  WBC 9.0  --   HGB 15.0 16.0  HCT 46.9 47.0  MCV 94.2  --   PLT 289  --    Basic Metabolic Panel: Recent Labs  Lab 01/22/21 0442 01/22/21 0503 01/23/21 0025 01/24/21 0229  NA 132* 133* 134* 138  K 5.8* 5.4* 3.9 4.0  CL 98  --  99 102  CO2 22  --  20* 25  GLUCOSE 90  --  167* 96  BUN 84*  --  83* 73*  CREATININE 7.89*  --  8.09* 8.46*  CALCIUM 7.8*  --  7.3* 7.5*   Liver Function Tests: Recent Labs  Lab 01/22/21 0442  AST 47*  ALT 26  ALKPHOS 79  BILITOT 1.0  PROT 6.8  ALBUMIN 3.3*   CBG: Recent Labs  Lab 01/23/21 1955 01/23/21 2344 01/24/21 0300 01/24/21 0859 01/24/21 1130  GLUCAP 167* 134* 106* 142* 78    Time spent: 35 minutes  Signed:  Berle Mull  [facility] 01/24/2021 5:21 PM

## 2021-01-24 NOTE — Progress Notes (Signed)
DISCHARGE NOTE HOME [name] to be discharged Home per MD order. Discussed prescriptions and follow up appointments with the patient. Prescriptions given to patient; medication list explained in detail. Patient verbalized understanding.  Skin clean, dry and intact without evidence of skin break down, no evidence of skin tears noted. IV catheter discontinued intact. Site without signs and symptoms of complications. Dressing and pressure applied. Pt denies pain at the site currently. No complaints noted.  Patient free of lines, drains, and wounds.   An After Visit Summary (AVS) was printed and given to the patient. Patient escorted via wheelchair, and discharged home via private auto.  Vira Agar, RN

## 2021-01-24 NOTE — Progress Notes (Signed)
Subjective: Now on 5500, said tolerated PD last night, no shortness of breath.  He reports spoke to his outpatient ED home training unit about his correct PD procedure and has appointment Tuesday with Dr. Zollie Scale his nephrologist .  An appointment with his diabetic doctor next week he feels stable to go home  Objective Vital signs in last 24 hours: Vitals:   01/23/21 1956 01/23/21 2115 01/24/21 0701 01/24/21 1100  BP: (!) 142/84 (!) 116/54 (!) 149/81 (!) 175/79  Pulse: 79 76 87 84  Resp:  18 18 18   Temp: 97.9 F (36.6 C) 98 F (36.7 C) 97.7 F (36.5 C) 98 F (36.7 C)  TempSrc: Oral Oral Oral Oral  SpO2: 97% 97% 97% 96%  Weight:      Height:       Weight change: -1.1 kg  Physical Exam: General: Alert, obese male NAD, appropriate Heart: RRR no MRG Lungs: CTA nonlabored breathing Abdomen: Obese bowel sounds normoactive, soft,, NTND, no ascites Extremities: Trace  bipedal edema  Dialysis Access: PD cath mid abdomen nontender no discharge at site  OP PD:  Followed by Dr. Zollie Scale [facility] office) 5 exchanges overnight, 3L fill volume, all 2.5% usually, no daytime exchanges   Problem/Plan: 1. Hypoglycemia/type I diabetes:Per primary team. Blood sugars now improved. On SSI.  Per patient home endocrinology to see him next week 2. ESRD:gets CCPD at home. Same here.  Resume PD 4/23 pm.   Follow-up with Dr. Zollie Scale in his home PD unit 3. Hypercarbic respiratory failure:due to vol overload/ pulm edema. Improving w/ all 2.5% PD  Wt's down 2.6 L from admission and breathing better. . 4. Hypertension: Blood pressures currently well controlled, continue home meds. Monitor BP with volume removal.  5. Possible pneumonia: Possible R infiltrate on chest x-ray, on antibiotics per primary team.  6. Hyperkalemia: A.m. K4.0 , elevated on admission likely 2/2 missed PD  7. Anemia:Hemoglobin 16. No ESA indicated. 8. Metabolic bone disease:Corrected calcium controlled. Continue  sensipar, calcitriol and auryxia once tolerating PO.  Okay for discharge per nephrology follow-up with his home nephrologist Dr. Zollie Scale next week and outpatient home training as he stated he has discussed his PD treatment plan  Ernest Haber, PA-C [facility] [phone] 01/24/2021,11:25 AM  LOS: 2 days   Labs: Basic Metabolic Panel: Recent Labs  Lab 01/22/21 0442 01/22/21 0503 01/23/21 0025 01/24/21 0229  NA 132* 133* 134* 138  K 5.8* 5.4* 3.9 4.0  CL 98  --  99 102  CO2 22  --  20* 25  GLUCOSE 90  --  167* 96  BUN 84*  --  83* 73*  CREATININE 7.89*  --  8.09* 8.46*  CALCIUM 7.8*  --  7.3* 7.5*   Liver Function Tests: Recent Labs  Lab 01/22/21 0442  AST 47*  ALT 26  ALKPHOS 79  BILITOT 1.0  PROT 6.8  ALBUMIN 3.3*   No results for input(s): LIPASE, AMYLASE in the last 168 hours. No results for input(s): AMMONIA in the last 168 hours. CBC: Recent Labs  Lab 01/22/21 0442 01/22/21 0503  WBC 9.0  --   HGB 15.0 16.0  HCT 46.9 47.0  MCV 94.2  --   PLT 289  --    Cardiac Enzymes: No results for input(s): CKTOTAL, CKMB, CKMBINDEX, TROPONINI in the last 168 hours. CBG: Recent Labs  Lab 01/23/21 1817 01/23/21 1955 01/23/21 2344 01/24/21 0300 01/24/21 0859  GLUCAP 82 167* 134* 106* 142*    Studies/Results:  No results found. Medications: . dialysis solution 2.5% low-MG/low-CA    . dialysis solution 4.25% low-MG/low-CA     . amLODipine  10 mg Oral Daily  . atorvastatin  80 mg Oral Daily  . Chlorhexidine Gluconate Cloth  6 each Topical Daily  . cinacalcet  60 mg Oral Q breakfast  . cloNIDine  0.1 mg Oral QHS  . ferric citrate  630 mg Oral TID WC  . gabapentin  100 mg Oral TID  . gentamicin cream  1 application Topical Daily  . heparin injection (subcutaneous)  5,000 Units Subcutaneous Q8H  . insulin pump   Subcutaneous Q4H

## 2021-01-25 DIAGNOSIS — Z992 Dependence on renal dialysis: Secondary | ICD-10-CM | POA: Diagnosis not present

## 2021-01-25 DIAGNOSIS — N186 End stage renal disease: Secondary | ICD-10-CM | POA: Diagnosis not present

## 2021-01-26 ENCOUNTER — Other Ambulatory Visit: Payer: Self-pay

## 2021-01-26 ENCOUNTER — Other Ambulatory Visit: Payer: Self-pay | Admitting: *Deleted

## 2021-01-26 ENCOUNTER — Telehealth: Payer: Self-pay | Admitting: Endocrinology

## 2021-01-26 ENCOUNTER — Encounter: Payer: Self-pay | Admitting: *Deleted

## 2021-01-26 DIAGNOSIS — E1065 Type 1 diabetes mellitus with hyperglycemia: Secondary | ICD-10-CM

## 2021-01-26 DIAGNOSIS — Z992 Dependence on renal dialysis: Secondary | ICD-10-CM | POA: Diagnosis not present

## 2021-01-26 DIAGNOSIS — N186 End stage renal disease: Secondary | ICD-10-CM | POA: Diagnosis not present

## 2021-01-26 MED ORDER — GLUCAGON EMERGENCY 1 MG IJ KIT
PACK | INTRAMUSCULAR | 0 refills | Status: DC
  Filled 2021-01-26: qty 1, 1d supply, fill #0
  Filled 2021-01-26: qty 2, 2d supply, fill #0

## 2021-01-26 NOTE — Telephone Encounter (Signed)
error 

## 2021-01-26 NOTE — Telephone Encounter (Signed)
Okay to send 2 of them

## 2021-01-26 NOTE — Patient Outreach (Signed)
[facility]) Care Management  01/26/2021  [name] March 07, 1971 [id number]   Transition of care call Referral received: 01/07/21 Initial outreach attempt: 01/08/21 Insurance: [employer]  Less than 30 day readmission : 4/21-4/23/22  Initial outreach 01/26/21  Transition of care call/case closure   Subjective: Initial successful telephone call to patient's preferred number in order to complete transition of care assessment; 2 HIPAA identifiers verified. Explained purpose of call and completed transition of care assessment.  [name] states that he is doing pretty good, he has returned to work. He discussed recent admissions due to hypoglycemia stating that issue is all straightened out report changes to setting of insulin pump setting have been made after contact with Dr.Kumar. He discussed that he is awaiting a new pump that has feature to detect hypoglycemia and shut off. He reports contacting Dr. Dwyane Dee office on today regarding need for more Glucagon injections to have on hand as his wife used prior to his 2 recent  admissions. He tolerating diet, denies bowel or bladder problems. Patient discussed trying to check her on his daily weight, doing the best he can. He discussed being on the wait list at [facility] for Kidney transplant  Spouse/ are assisting with his/her recovery.   Reviewed accessing the following [place] Benefits : He discussed being enrolled with Active health management chronic condition program.  He uses a [facility] outpatient pharmacy.at [facility] employee outpatient pharmacy.       Objective:  [facility] Acute respiratory failure, Acute Metabolic encephalopathy due to hypoglycemia 24.Comorbidities include: Type 1 Diabetes ( A1c 59.1 6/38/46) Diastolic heart failure, hypertension , obesity, ESRD Peritoneal Dialysis OSA CPAP. Hospital readmission  4/21-4/23/21 at [facility] for Hypoglycemia. He was discharged to home on 4/23/21without the need for home health servicesor DME.  Assessment:  Patient voices good understanding of all discharge instructions.  See transition of care flowsheet for assessment details   Plan:  Reviewed hospital discharge diagnosis of Hypoglycemia  and discharge treatment plan using hospital discharge instructions, assessing medication adherence, reviewing problems requiring provider notification, and discussing the importance of follow up with  primary care provider and/or specialists as directed. Patient declined additional Location manager or follow up.  Reviewed [place] healthy lifestyle program information to receive discounted premium for  2023   Step 1: Get  your annual physical  Step 2: Complete your health assessment  Step 3:Identify your current health status and complete the corresponding action step between October 04, 2020 and June 04, 2021.    Using [place] website, verified that patient is an active participate in [place]'s Active Health Management chronic disease management program.    No ongoing care management needs identified so will close case to [facility] services and route successful outreach letter with [place] Management pamphlet and 24 Hour Nurse Line Magnet to [place] Management clinical pool to be mailed to patient's home address.  Thanked patient for their services to [facility].  Joylene Draft, RN, BSN  [place] Management Coordinator  [phone]- Mobile [phone]- Toll Free Main Office

## 2021-01-26 NOTE — Telephone Encounter (Signed)
Sent Rx Glucagon (2 pen) to the pharmacy.

## 2021-01-26 NOTE — Telephone Encounter (Signed)
Pt  is requesting  Emergency Glucagon pen and is wondering if he can get two Emergency Glucagon pen's  If any questions call pt back.   Pt uses  [facility]

## 2021-01-26 NOTE — Telephone Encounter (Signed)
Please advise 

## 2021-01-27 DIAGNOSIS — Z992 Dependence on renal dialysis: Secondary | ICD-10-CM | POA: Diagnosis not present

## 2021-01-27 DIAGNOSIS — N186 End stage renal disease: Secondary | ICD-10-CM | POA: Diagnosis not present

## 2021-01-27 LAB — CULTURE, BLOOD (SINGLE)
Culture: NO GROWTH
Special Requests: ADEQUATE

## 2021-01-28 DIAGNOSIS — Z992 Dependence on renal dialysis: Secondary | ICD-10-CM | POA: Diagnosis not present

## 2021-01-28 DIAGNOSIS — N186 End stage renal disease: Secondary | ICD-10-CM | POA: Diagnosis not present

## 2021-01-29 DIAGNOSIS — Z992 Dependence on renal dialysis: Secondary | ICD-10-CM | POA: Diagnosis not present

## 2021-01-29 DIAGNOSIS — N186 End stage renal disease: Secondary | ICD-10-CM | POA: Diagnosis not present

## 2021-01-30 DIAGNOSIS — Z992 Dependence on renal dialysis: Secondary | ICD-10-CM | POA: Diagnosis not present

## 2021-01-30 DIAGNOSIS — N186 End stage renal disease: Secondary | ICD-10-CM | POA: Diagnosis not present

## 2021-01-31 DIAGNOSIS — Z992 Dependence on renal dialysis: Secondary | ICD-10-CM | POA: Diagnosis not present

## 2021-01-31 DIAGNOSIS — N186 End stage renal disease: Secondary | ICD-10-CM | POA: Diagnosis not present

## 2021-02-01 DIAGNOSIS — Z992 Dependence on renal dialysis: Secondary | ICD-10-CM | POA: Diagnosis not present

## 2021-02-01 DIAGNOSIS — N186 End stage renal disease: Secondary | ICD-10-CM | POA: Diagnosis not present

## 2021-02-02 DIAGNOSIS — Z992 Dependence on renal dialysis: Secondary | ICD-10-CM | POA: Diagnosis not present

## 2021-02-02 DIAGNOSIS — N186 End stage renal disease: Secondary | ICD-10-CM | POA: Diagnosis not present

## 2021-02-03 DIAGNOSIS — N186 End stage renal disease: Secondary | ICD-10-CM | POA: Diagnosis not present

## 2021-02-03 DIAGNOSIS — D509 Iron deficiency anemia, unspecified: Secondary | ICD-10-CM | POA: Diagnosis not present

## 2021-02-03 DIAGNOSIS — Z992 Dependence on renal dialysis: Secondary | ICD-10-CM | POA: Diagnosis not present

## 2021-02-04 DIAGNOSIS — N186 End stage renal disease: Secondary | ICD-10-CM | POA: Diagnosis not present

## 2021-02-04 DIAGNOSIS — Z992 Dependence on renal dialysis: Secondary | ICD-10-CM | POA: Diagnosis not present

## 2021-02-05 ENCOUNTER — Other Ambulatory Visit: Payer: Self-pay

## 2021-02-05 ENCOUNTER — Ambulatory Visit: Payer: Self-pay | Admitting: *Deleted

## 2021-02-05 DIAGNOSIS — Z992 Dependence on renal dialysis: Secondary | ICD-10-CM | POA: Diagnosis not present

## 2021-02-05 DIAGNOSIS — N186 End stage renal disease: Secondary | ICD-10-CM | POA: Diagnosis not present

## 2021-02-05 MED FILL — Insulin Lispro Inj Soln 100 Unit/ML: INTRAMUSCULAR | 90 days supply | Qty: 90 | Fill #0 | Status: AC

## 2021-02-05 MED FILL — Continuous Glucose System Sensor: 28 days supply | Qty: 2 | Fill #1 | Status: AC

## 2021-02-06 DIAGNOSIS — E1065 Type 1 diabetes mellitus with hyperglycemia: Secondary | ICD-10-CM | POA: Diagnosis not present

## 2021-02-06 DIAGNOSIS — N186 End stage renal disease: Secondary | ICD-10-CM | POA: Diagnosis not present

## 2021-02-06 DIAGNOSIS — Z992 Dependence on renal dialysis: Secondary | ICD-10-CM | POA: Diagnosis not present

## 2021-02-07 DIAGNOSIS — Z992 Dependence on renal dialysis: Secondary | ICD-10-CM | POA: Diagnosis not present

## 2021-02-07 DIAGNOSIS — N186 End stage renal disease: Secondary | ICD-10-CM | POA: Diagnosis not present

## 2021-02-08 DIAGNOSIS — Z992 Dependence on renal dialysis: Secondary | ICD-10-CM | POA: Diagnosis not present

## 2021-02-08 DIAGNOSIS — N186 End stage renal disease: Secondary | ICD-10-CM | POA: Diagnosis not present

## 2021-02-09 DIAGNOSIS — N186 End stage renal disease: Secondary | ICD-10-CM | POA: Diagnosis not present

## 2021-02-09 DIAGNOSIS — Z992 Dependence on renal dialysis: Secondary | ICD-10-CM | POA: Diagnosis not present

## 2021-02-10 ENCOUNTER — Other Ambulatory Visit: Payer: Self-pay

## 2021-02-10 ENCOUNTER — Other Ambulatory Visit (INDEPENDENT_AMBULATORY_CARE_PROVIDER_SITE_OTHER): Payer: 59

## 2021-02-10 DIAGNOSIS — Z992 Dependence on renal dialysis: Secondary | ICD-10-CM | POA: Diagnosis not present

## 2021-02-10 DIAGNOSIS — E7800 Pure hypercholesterolemia, unspecified: Secondary | ICD-10-CM

## 2021-02-10 DIAGNOSIS — E1065 Type 1 diabetes mellitus with hyperglycemia: Secondary | ICD-10-CM | POA: Diagnosis not present

## 2021-02-10 DIAGNOSIS — N186 End stage renal disease: Secondary | ICD-10-CM | POA: Diagnosis not present

## 2021-02-10 LAB — LIPID PANEL
Cholesterol: 178 mg/dL (ref 0–200)
HDL: 53.4 mg/dL (ref >39.00)
LDL Cholesterol: 95 mg/dL (ref 0–99)
NonHDL: 124.33
Total CHOL/HDL Ratio: 3
Triglycerides: 145 mg/dL (ref 0.0–149.0)
VLDL: 29 mg/dL (ref 0.0–40.0)

## 2021-02-10 LAB — MICROALBUMIN / CREATININE URINE RATIO
Creatinine,U: 59.8 mg/dL
Microalb Creat Ratio: 156.1 mg/g — ABNORMAL HIGH (ref 0.0–30.0)
Microalb, Ur: 93.4 mg/dL — ABNORMAL HIGH (ref 0.0–1.9)

## 2021-02-10 LAB — BASIC METABOLIC PANEL
BUN: 77 mg/dL — ABNORMAL HIGH (ref 6–23)
CO2: 26 mEq/L (ref 19–32)
Calcium: 8.3 mg/dL — ABNORMAL LOW (ref 8.4–10.5)
Chloride: 96 mEq/L (ref 96–112)
Creatinine, Ser: 7.74 mg/dL (ref 0.40–1.50)
GFR: 7.64 mL/min — CL (ref >60.00)
Glucose, Bld: 217 mg/dL — ABNORMAL HIGH (ref 70–99)
Potassium: 5 mEq/L (ref 3.5–5.1)
Sodium: 132 mEq/L — ABNORMAL LOW (ref 135–145)

## 2021-02-10 LAB — HEMOGLOBIN A1C: Hgb A1c MFr Bld: 8.6 % — ABNORMAL HIGH (ref 4.6–6.5)

## 2021-02-11 DIAGNOSIS — N186 End stage renal disease: Secondary | ICD-10-CM | POA: Diagnosis not present

## 2021-02-11 DIAGNOSIS — Z992 Dependence on renal dialysis: Secondary | ICD-10-CM | POA: Diagnosis not present

## 2021-02-12 ENCOUNTER — Ambulatory Visit: Payer: 59 | Admitting: Endocrinology

## 2021-02-12 ENCOUNTER — Other Ambulatory Visit: Payer: Self-pay

## 2021-02-12 VITALS — BP 178/74 | HR 83 | Ht 71.0 in | Wt 261.2 lb

## 2021-02-12 DIAGNOSIS — E7800 Pure hypercholesterolemia, unspecified: Secondary | ICD-10-CM

## 2021-02-12 DIAGNOSIS — E1065 Type 1 diabetes mellitus with hyperglycemia: Secondary | ICD-10-CM | POA: Diagnosis not present

## 2021-02-12 DIAGNOSIS — N186 End stage renal disease: Secondary | ICD-10-CM | POA: Diagnosis not present

## 2021-02-12 DIAGNOSIS — Z992 Dependence on renal dialysis: Secondary | ICD-10-CM | POA: Diagnosis not present

## 2021-02-12 NOTE — Progress Notes (Signed)
Patient ID: [name], male   DOB: 11/30/1970, 50 y.o.   MRN: [id number]           Reason for Appointment : for Type 1 Diabetes  History of Present Illness   Referring HCP: Dr. Holley Raring         Diagnosis: Type 1 diabetes mellitus, date of diagnosis:  1997        Previous history:  He has been variously on insulin injections and insulin pumps since his diagnosis He has been on insulin pump on and off for the last few years with fair control  Recent history:   INSULIN pump settings with the MINIMED 670 G pump:  Basal rate #1: 12 AM = 0.5, 3 AM = 0.9, from 6 AM = 1.0, 12:30 PM = 1.5 and 8 PM = 2.0 Basal rate #2: 12 AM = 1.7, 8 AM = 1.4 and 6 PM = 1.8  Carbohydrate coverage 1: 7 at breakfast and suppertime and 1: 8 at lunch  Sensitivity 1: 30 except 1: 20 between 8 PM-8 AM Current target 120 and active insulin 3 hours  A1c on 10/14/2020 was 13.3 and is now 8.6  Current management, blood sugar patterns and problems identified:    Since his basal rates were increased after his last visit he started episodes of severe hypoglycemia requiring admission  Overnight basal rates have been decreased as well as his daytime basal rates until 12 noon are about half of what he was previously given  He has to wait at least another month to get the 770 pump and is also still waiting for the transmitter for the 670 sensors as well as training  Since he is afraid of hypoglycemia on occasional nights when he is heading near hypoglycemic range he will disconnect his pump for several hours at a time  Usually will then have a rebound  Also appears to be bolusing mostly after meals when the blood sugar goes up  This may then subsequently because low normal readings and another rebound  He says he does not like to bolus before starting to eat especially at work since he may sometimes get called in before he finishes his meal  Also currently has a large supply of Humalog  insulin  Difficult to assess his mealtime carbohydrate coverage since he will not be bolusing before eating  Some overnight hypoglycemic episode may be related to bedtime snacks without coverage  He does not do any formal exercise but he is walking during the day at work up to 4-5 miles total  Data from freestyle LIBRE:   Interpretation of his CGM shows the following for the few weeks  His blood sugars are extremely labile and variable from day-to-day  Generally blood sugars are progressively higher from morning till mid afternoon and highest around 9 PM after dinner  Frequently is showing significant blood sugar spikes at various times of the day although less during the night; sometimes may have only 1 significant spike in blood sugar but otherwise may have up to 4 spikes  Usually blood sugars may be near normal or low normal by about midnight  Hypoglycemia has been minimal with occasional low normal readings around 3 AM or 4-5 PM  OVERNIGHT blood sugars are highly variable but generally tending to be lower around midnight and rarely higher the rest of the night  CGM use % of time  92  2-week average/GV  213  Time in range  43     %  %  Time Above 180  23  % Time above 250  33  % Time Below 70 1     PRE-MEAL Fasting Lunch Dinner Bedtime Overall  Glucose range:       Averages:  190   206   213   POST-MEAL PC Breakfast PC Lunch PC Dinner  Glucose range:     Averages:  233  245  to 89      Self-care: Typical breakfast will be toast, eggs and bacon Usually lunch will be chicken, pasta and green beans Typical dinner: Steak asparagus and corn Snacks peanut butter crackers and chips   Mealtimes are: Dinner: 6-9 PM                Dietician consultation: Most recent: none.         CDE consultation: Years ago  Diabetes labs:  Lab Results  Component Value Date   HGBA1C 8.6 (H) 02/10/2021   HGBA1C 8.1 (H) 01/22/2021   HGBA1C 13.3 10/14/2020   Lab Results   Component Value Date   MICROALBUR 93.4 (H) 02/10/2021   LDLCALC 95 02/10/2021   CREATININE 7.74 (HH) 02/10/2021    Lab Results  Component Value Date   MICRALBCREAT 156.1 (H) 02/10/2021     Allergies as of 02/12/2021      Reactions   Sulfa Antibiotics Anaphylaxis, Swelling   Pt is not allergic to iodine, has had iodine in the past w/o premeds and w/ no problems   Oxycodone Nausea And Vomiting   Oxycontin [oxycodone Hcl] Nausea And Vomiting   Penicillins Other (See Comments)   Possible reaction many years ago per patient (Pt reports he has had pcn without issues since time of "reaction") Other reaction(s): Unknown   Prednisone Other (See Comments)   Patient is diabetic, runs sugar up Other reaction(s): Other (See Comments), Other (See Comments) Patient is diabetic, runs sugar up Patient is diabetic, runs sugar up Patient is diabetic, runs sugar up    Shellfish-derived Products Swelling   Eyes swell with shellfish Pt is not allergic to iodine, has had iodine in the past w/o premeds and w/ no problem      Medication List       Accurate as of Feb 12, 2021  4:33 PM. If you have any questions, ask your nurse or doctor.        acetaminophen 500 MG tablet Commonly known as: TYLENOL Take 500-1,000 mg by mouth every 6 (six) hours as needed for mild pain or fever.   amLODipine 10 MG tablet Commonly known as: NORVASC TAKE 1 TABLET BY MOUTH ONCE DAILY What changed: how much to take   atorvastatin 80 MG tablet Commonly known as: LIPITOR TAKE 1 TABLET BY MOUTH ONCE DAILY What changed: how much to take   Auryxia 1 GM 210 MG(Fe) tablet Generic drug: ferric citrate TAKE 3 TABLETS BY MOUTH WITH MEALS AND 1 TABLET WITH SNACKS. DON'T EXCEED 11 TABLETS DAILY. What changed:   how much to take  how to take this  when to take this   calcitRIOL 0.25 MCG capsule Commonly known as: ROCALTROL Take 0.25 mcg by mouth daily.   cinacalcet 60 MG tablet Commonly known as:  SENSIPAR Take 1 tablet (60 mg total) by mouth daily.   cloNIDine 0.1 MG tablet Commonly known as: CATAPRES Take 0.1 mg by mouth 2 (two) times daily.   FreeStyle Libre 2 Sensor Misc APPLY EVERY 14 DAYS.   Glucagon Emergency 1 MG Kit Inject into the muscle as  directed for severe sugar.   HumaLOG 100 UNIT/ML injection Generic drug: insulin lispro Use up to 100 units total via insulin pump daily   losartan 100 MG tablet Commonly known as: COZAAR TAKE 1 TABLET BY MOUTH ONCE DAILY What changed: how much to take   polyethylene glycol 17 g packet Commonly known as: MiraLax Take 17 g by mouth daily.   Suprep Bowel Prep Kit 17.5-3.13-1.6 GM/177ML Soln Generic drug: Na Sulfate-K Sulfate-Mg Sulf USE AS DIRECTED   torsemide 100 MG tablet Commonly known as: DEMADEX Take 1 tablet (100 mg total) by mouth 2 (two) times daily.   triamcinolone ointment 0.1 % Commonly known as: KENALOG APPLY TOPICALLY 2 TIMES A DAY What changed:   how much to take  how to take this  when to take this       Allergies:  Allergies  Allergen Reactions  . Sulfa Antibiotics Anaphylaxis and Swelling    Pt is not allergic to iodine, has had iodine in the past w/o premeds and w/ no problems  . Oxycodone Nausea And Vomiting  . Oxycontin [Oxycodone Hcl] Nausea And Vomiting  . Penicillins Other (See Comments)    Possible reaction many years ago per patient (Pt reports he has had pcn without issues since time of "reaction") Other reaction(s): Unknown  . Prednisone Other (See Comments)    Patient is diabetic, runs sugar up Other reaction(s): Other (See Comments), Other (See Comments) Patient is diabetic, runs sugar up Patient is diabetic, runs sugar up Patient is diabetic, runs sugar up   . Shellfish-Derived Products Swelling    Eyes swell with shellfish Pt is not allergic to iodine, has had iodine in the past w/o premeds and w/ no problem    Past Medical History:  Diagnosis Date  . ESRD (end  stage renal disease) ([place])   . Gastroparesis   . Herniated cervical disc   . HTN (hypertension)   . Hypercholesteremia   . Morbid obesity ([place])   . Peritoneal dialysis status ([place])   . PONV (postoperative nausea and vomiting)    after peritoneal dialysis catheter was placed  . S/P cardiac cath    a. 10-15 yrs ago at [facility] due to tachycardia, reportedly normal. b. Normal ETT 03/2012.  Marland Kitchen Sinus tachycardia    a. 24-hr Holter 03/2012 - SR, occ PVCs, no VT, avg HR 96bpm.  . TIA (transient ischemic attack)   . Type 1 diabetes mellitus ([place])    a. With insulin pump.    Past Surgical History:  Procedure Laterality Date  . BIOPSY  10/03/2019   Procedure: BIOPSY;  Surgeon: Mauri Pole, MD;  Location: [facility];  Service: Endoscopy;;  . cervical neck fusion    . COLONOSCOPY WITH PROPOFOL N/A 01/02/2021   Procedure: COLONOSCOPY WITH PROPOFOL;  Surgeon: [name], MD;  Location: [place];  Service: Endoscopy;  Laterality: N/A;  priority 4 DIABETIC needs potassium draw  . ESOPHAGOGASTRODUODENOSCOPY (EGD) WITH PROPOFOL N/A 10/03/2019   Procedure: ESOPHAGOGASTRODUODENOSCOPY (EGD) WITH PROPOFOL;  Surgeon: Mauri Pole, MD;  Location: [facility];  Service: Endoscopy;  Laterality: N/A;  . HERNIA REPAIR     bilateral  . LEFT HEART CATHETERIZATION WITH CORONARY ANGIOGRAM N/A 12/18/2013   Procedure: LEFT HEART CATHETERIZATION WITH CORONARY ANGIOGRAM;  Surgeon: [name] M Martinique, MD;  Location: [facility] CATH LAB;  Service: Cardiovascular;  Laterality: N/A;  . POLYPECTOMY  01/02/2021   Procedure: POLYPECTOMY;  Surgeon: [name], MD;  Location: [place];  Service: Endoscopy;;  Family History  Problem Relation Age of Onset  . Hypertension Other   . Coronary artery disease Other        Mother's side - both her side's grandparents died of heart disease (MIs)  . Diabetes Other   . Stroke Other        Paternal grandfather (48)  . Prostate cancer Neg Hx   . Bladder  Cancer Neg Hx   . Kidney cancer Neg Hx     Social History:  reports that he has never smoked. He has never used smokeless tobacco. He reports that he does not drink alcohol and does not use drugs.      Review of Systems      Lipids: Has been treated with 80 mg atorvastatin, recent labs as follows  Lab Results  Component Value Date   CHOL 178 02/10/2021   HDL 53.40 02/10/2021   LDLCALC 95 02/10/2021   TRIG 145.0 02/10/2021   CHOLHDL 3 02/10/2021    Last dilated eye exam was in 27/78  DIABETES COMPLICATIONS: Nephropathy, diabetic foot ulcers    Physical Examination:  BP (!) 178/74   Pulse 83   Ht 5' 11"  (1.803 m)   Wt 261 lb 3.2 oz (118.5 kg)   SpO2 97%   BMI 36.43 kg/m    ASSESSMENT:  Diabetes type 1, poorly controlled  A1c is 8.6  Problems identified:  He started having hypoglycemia with increasing his basal rates previously but now his blood sugars are averaging over 200 with significant postprandial hyperglycemia  He is still not getting his sensors started for the [employer] pump to allow at least suspend on low  Also he is afraid of low sugars and will bolus smaller amounts and usually waiting till after the sugar goes up after the meal  This will cause marked swinging of blood sugars between high and lows  Currently his basal rates are avoiding his blood sugar going low overnight but he is still having relatively high readings except around 1-2 AM  May also be getting excessive correction especially between 8 PM and 8 AM when he is having a correction factor I: 20  History of hyperlipidemia: Well-controlled  PLAN:   Needs to bolus consistently for all meals and may do so right after finishing eating at least while he is at work or if he knows how much she will eat Basal rate will be increased at 6 AM up to 1.25, likely needs higher basal rates in the evenings also We will add additional boluses for any high fat intake He will change his blood sugar  target to 150 for now Correction factor I: 30 for all 24 hours May also need to use temporary basal rates when he is more active at work  He will be trained on using the Guardian sensors for his 670 pump next week so that he can at least activate the suspend before low feature Also when he is able to get the 770 pump he will be converted to the auto mode  Total visit time including counseling is 30 minutes  There are no Patient Instructions on file for this visit.    Elayne Snare 02/12/2021, 4:33 PM   Copy of consultation sent to referring physician   Note: This note was prepared with Dragon voice recognition system technology. Any transcriptional errors that result from this process are unintentional.

## 2021-02-12 NOTE — Patient Instructions (Signed)
Bolus at end of each meal at work

## 2021-02-13 DIAGNOSIS — Z992 Dependence on renal dialysis: Secondary | ICD-10-CM | POA: Diagnosis not present

## 2021-02-13 DIAGNOSIS — N186 End stage renal disease: Secondary | ICD-10-CM | POA: Diagnosis not present

## 2021-02-14 DIAGNOSIS — N186 End stage renal disease: Secondary | ICD-10-CM | POA: Diagnosis not present

## 2021-02-14 DIAGNOSIS — Z992 Dependence on renal dialysis: Secondary | ICD-10-CM | POA: Diagnosis not present

## 2021-02-15 DIAGNOSIS — Z992 Dependence on renal dialysis: Secondary | ICD-10-CM | POA: Diagnosis not present

## 2021-02-15 DIAGNOSIS — N186 End stage renal disease: Secondary | ICD-10-CM | POA: Diagnosis not present

## 2021-02-16 ENCOUNTER — Telehealth: Payer: Self-pay | Admitting: Nutrition

## 2021-02-16 DIAGNOSIS — Z992 Dependence on renal dialysis: Secondary | ICD-10-CM | POA: Diagnosis not present

## 2021-02-16 DIAGNOSIS — N186 End stage renal disease: Secondary | ICD-10-CM | POA: Diagnosis not present

## 2021-02-16 NOTE — Telephone Encounter (Signed)
Text message from [place] with [employer] saying she has sent him some sensors, and he would like training on how/where to use them.  Scheduled appointment for Wednesday 5/18.

## 2021-02-17 DIAGNOSIS — Z992 Dependence on renal dialysis: Secondary | ICD-10-CM | POA: Diagnosis not present

## 2021-02-17 DIAGNOSIS — N186 End stage renal disease: Secondary | ICD-10-CM | POA: Diagnosis not present

## 2021-02-18 ENCOUNTER — Encounter: Payer: 59 | Attending: Endocrinology | Admitting: Nutrition

## 2021-02-18 ENCOUNTER — Other Ambulatory Visit: Payer: Self-pay

## 2021-02-18 DIAGNOSIS — E1040 Type 1 diabetes mellitus with diabetic neuropathy, unspecified: Secondary | ICD-10-CM | POA: Insufficient documentation

## 2021-02-18 DIAGNOSIS — N186 End stage renal disease: Secondary | ICD-10-CM | POA: Diagnosis not present

## 2021-02-18 DIAGNOSIS — Z992 Dependence on renal dialysis: Secondary | ICD-10-CM | POA: Diagnosis not present

## 2021-02-19 DIAGNOSIS — N186 End stage renal disease: Secondary | ICD-10-CM | POA: Diagnosis not present

## 2021-02-19 DIAGNOSIS — Z992 Dependence on renal dialysis: Secondary | ICD-10-CM | POA: Diagnosis not present

## 2021-02-20 DIAGNOSIS — N186 End stage renal disease: Secondary | ICD-10-CM | POA: Diagnosis not present

## 2021-02-20 DIAGNOSIS — Z992 Dependence on renal dialysis: Secondary | ICD-10-CM | POA: Diagnosis not present

## 2021-02-21 DIAGNOSIS — Z992 Dependence on renal dialysis: Secondary | ICD-10-CM | POA: Diagnosis not present

## 2021-02-21 DIAGNOSIS — N186 End stage renal disease: Secondary | ICD-10-CM | POA: Diagnosis not present

## 2021-02-22 DIAGNOSIS — Z992 Dependence on renal dialysis: Secondary | ICD-10-CM | POA: Diagnosis not present

## 2021-02-22 DIAGNOSIS — N186 End stage renal disease: Secondary | ICD-10-CM | POA: Diagnosis not present

## 2021-02-23 DIAGNOSIS — N186 End stage renal disease: Secondary | ICD-10-CM | POA: Diagnosis not present

## 2021-02-23 DIAGNOSIS — Z992 Dependence on renal dialysis: Secondary | ICD-10-CM | POA: Diagnosis not present

## 2021-02-24 ENCOUNTER — Other Ambulatory Visit: Payer: Self-pay

## 2021-02-24 DIAGNOSIS — N186 End stage renal disease: Secondary | ICD-10-CM | POA: Diagnosis not present

## 2021-02-24 DIAGNOSIS — Z992 Dependence on renal dialysis: Secondary | ICD-10-CM | POA: Diagnosis not present

## 2021-02-24 MED ORDER — GENTAMICIN SULFATE 0.1 % EX CREA
TOPICAL_CREAM | CUTANEOUS | 12 refills | Status: DC
  Filled 2021-02-24: qty 30, 20d supply, fill #0

## 2021-02-24 MED FILL — Ferric Citrate Tab 1 GM (210 MG Ferric Iron): ORAL | 90 days supply | Qty: 990 | Fill #0 | Status: CN

## 2021-02-25 DIAGNOSIS — N186 End stage renal disease: Secondary | ICD-10-CM | POA: Diagnosis not present

## 2021-02-25 DIAGNOSIS — Z992 Dependence on renal dialysis: Secondary | ICD-10-CM | POA: Diagnosis not present

## 2021-02-26 DIAGNOSIS — N186 End stage renal disease: Secondary | ICD-10-CM | POA: Diagnosis not present

## 2021-02-26 DIAGNOSIS — Z992 Dependence on renal dialysis: Secondary | ICD-10-CM | POA: Diagnosis not present

## 2021-02-27 DIAGNOSIS — Z992 Dependence on renal dialysis: Secondary | ICD-10-CM | POA: Diagnosis not present

## 2021-02-27 DIAGNOSIS — N186 End stage renal disease: Secondary | ICD-10-CM | POA: Diagnosis not present

## 2021-02-28 DIAGNOSIS — Z992 Dependence on renal dialysis: Secondary | ICD-10-CM | POA: Diagnosis not present

## 2021-02-28 DIAGNOSIS — N186 End stage renal disease: Secondary | ICD-10-CM | POA: Diagnosis not present

## 2021-03-01 DIAGNOSIS — Z992 Dependence on renal dialysis: Secondary | ICD-10-CM | POA: Diagnosis not present

## 2021-03-01 DIAGNOSIS — N186 End stage renal disease: Secondary | ICD-10-CM | POA: Diagnosis not present

## 2021-03-02 DIAGNOSIS — N186 End stage renal disease: Secondary | ICD-10-CM | POA: Diagnosis not present

## 2021-03-02 DIAGNOSIS — Z992 Dependence on renal dialysis: Secondary | ICD-10-CM | POA: Diagnosis not present

## 2021-03-03 DIAGNOSIS — N186 End stage renal disease: Secondary | ICD-10-CM | POA: Diagnosis not present

## 2021-03-03 DIAGNOSIS — E113513 Type 2 diabetes mellitus with proliferative diabetic retinopathy with macular edema, bilateral: Secondary | ICD-10-CM | POA: Diagnosis not present

## 2021-03-03 DIAGNOSIS — Z992 Dependence on renal dialysis: Secondary | ICD-10-CM | POA: Diagnosis not present

## 2021-03-04 DIAGNOSIS — N186 End stage renal disease: Secondary | ICD-10-CM | POA: Diagnosis not present

## 2021-03-04 DIAGNOSIS — Z992 Dependence on renal dialysis: Secondary | ICD-10-CM | POA: Diagnosis not present

## 2021-03-05 ENCOUNTER — Other Ambulatory Visit: Payer: Self-pay

## 2021-03-05 DIAGNOSIS — D509 Iron deficiency anemia, unspecified: Secondary | ICD-10-CM | POA: Diagnosis not present

## 2021-03-05 DIAGNOSIS — N186 End stage renal disease: Secondary | ICD-10-CM | POA: Diagnosis not present

## 2021-03-05 DIAGNOSIS — Z992 Dependence on renal dialysis: Secondary | ICD-10-CM | POA: Diagnosis not present

## 2021-03-05 MED ORDER — CLONIDINE HCL 0.1 MG PO TABS
ORAL_TABLET | ORAL | 2 refills | Status: DC
  Filled 2021-03-05: qty 270, 90d supply, fill #0

## 2021-03-05 MED ORDER — CARVEDILOL 25 MG PO TABS
ORAL_TABLET | ORAL | 0 refills | Status: DC
  Filled 2021-03-05: qty 180, 90d supply, fill #0

## 2021-03-05 MED FILL — Ferric Citrate Tab 1 GM (210 MG Ferric Iron): ORAL | 90 days supply | Qty: 990 | Fill #0 | Status: AC

## 2021-03-05 MED FILL — Amlodipine Besylate Tab 10 MG (Base Equivalent): ORAL | 90 days supply | Qty: 90 | Fill #0 | Status: AC

## 2021-03-05 MED FILL — Atorvastatin Calcium Tab 80 MG (Base Equivalent): ORAL | 90 days supply | Qty: 90 | Fill #0 | Status: AC

## 2021-03-05 MED FILL — Losartan Potassium Tab 100 MG: ORAL | 90 days supply | Qty: 90 | Fill #0 | Status: AC

## 2021-03-06 ENCOUNTER — Other Ambulatory Visit: Payer: Self-pay

## 2021-03-06 DIAGNOSIS — N186 End stage renal disease: Secondary | ICD-10-CM | POA: Diagnosis not present

## 2021-03-06 DIAGNOSIS — Z992 Dependence on renal dialysis: Secondary | ICD-10-CM | POA: Diagnosis not present

## 2021-03-06 MED FILL — Continuous Glucose System Sensor: 28 days supply | Qty: 2 | Fill #2 | Status: AC

## 2021-03-07 DIAGNOSIS — N186 End stage renal disease: Secondary | ICD-10-CM | POA: Diagnosis not present

## 2021-03-07 DIAGNOSIS — Z992 Dependence on renal dialysis: Secondary | ICD-10-CM | POA: Diagnosis not present

## 2021-03-08 DIAGNOSIS — N186 End stage renal disease: Secondary | ICD-10-CM | POA: Diagnosis not present

## 2021-03-08 DIAGNOSIS — Z992 Dependence on renal dialysis: Secondary | ICD-10-CM | POA: Diagnosis not present

## 2021-03-09 DIAGNOSIS — N186 End stage renal disease: Secondary | ICD-10-CM | POA: Diagnosis not present

## 2021-03-09 DIAGNOSIS — Z992 Dependence on renal dialysis: Secondary | ICD-10-CM | POA: Diagnosis not present

## 2021-03-10 DIAGNOSIS — N186 End stage renal disease: Secondary | ICD-10-CM | POA: Diagnosis not present

## 2021-03-10 DIAGNOSIS — Z992 Dependence on renal dialysis: Secondary | ICD-10-CM | POA: Diagnosis not present

## 2021-03-11 DIAGNOSIS — Z992 Dependence on renal dialysis: Secondary | ICD-10-CM | POA: Diagnosis not present

## 2021-03-11 DIAGNOSIS — N186 End stage renal disease: Secondary | ICD-10-CM | POA: Diagnosis not present

## 2021-03-11 NOTE — Patient Instructions (Signed)
Change sensor every 7 days Calibrate the sensor every 8 hours with a finger stick blood sugar reading Read over manual on CGM Call [employer] if questions or if sensor fails.

## 2021-03-11 NOTE — Progress Notes (Signed)
Mr. Fendley was trained on how/when/when to use the [employer] sensor.  This was linked to his pump.  He was shown how to calibrate the the sensor, and reported good understanding of this.  Instructed him of the need to do this every 8 hours, and encouraged him to do this first morning, and before bed, and one other time during the day.  He agreed to do this and had no final questions.

## 2021-03-12 DIAGNOSIS — Z992 Dependence on renal dialysis: Secondary | ICD-10-CM | POA: Diagnosis not present

## 2021-03-12 DIAGNOSIS — N186 End stage renal disease: Secondary | ICD-10-CM | POA: Diagnosis not present

## 2021-03-13 DIAGNOSIS — Z992 Dependence on renal dialysis: Secondary | ICD-10-CM | POA: Diagnosis not present

## 2021-03-13 DIAGNOSIS — N186 End stage renal disease: Secondary | ICD-10-CM | POA: Diagnosis not present

## 2021-03-14 DIAGNOSIS — Z992 Dependence on renal dialysis: Secondary | ICD-10-CM | POA: Diagnosis not present

## 2021-03-14 DIAGNOSIS — N186 End stage renal disease: Secondary | ICD-10-CM | POA: Diagnosis not present

## 2021-03-15 DIAGNOSIS — N186 End stage renal disease: Secondary | ICD-10-CM | POA: Diagnosis not present

## 2021-03-15 DIAGNOSIS — Z992 Dependence on renal dialysis: Secondary | ICD-10-CM | POA: Diagnosis not present

## 2021-03-16 DIAGNOSIS — Z992 Dependence on renal dialysis: Secondary | ICD-10-CM | POA: Diagnosis not present

## 2021-03-16 DIAGNOSIS — N186 End stage renal disease: Secondary | ICD-10-CM | POA: Diagnosis not present

## 2021-03-17 DIAGNOSIS — N186 End stage renal disease: Secondary | ICD-10-CM | POA: Diagnosis not present

## 2021-03-17 DIAGNOSIS — Z992 Dependence on renal dialysis: Secondary | ICD-10-CM | POA: Diagnosis not present

## 2021-03-18 DIAGNOSIS — Z992 Dependence on renal dialysis: Secondary | ICD-10-CM | POA: Diagnosis not present

## 2021-03-18 DIAGNOSIS — N186 End stage renal disease: Secondary | ICD-10-CM | POA: Diagnosis not present

## 2021-03-19 DIAGNOSIS — Z992 Dependence on renal dialysis: Secondary | ICD-10-CM | POA: Diagnosis not present

## 2021-03-19 DIAGNOSIS — N186 End stage renal disease: Secondary | ICD-10-CM | POA: Diagnosis not present

## 2021-03-20 DIAGNOSIS — N186 End stage renal disease: Secondary | ICD-10-CM | POA: Diagnosis not present

## 2021-03-20 DIAGNOSIS — Z992 Dependence on renal dialysis: Secondary | ICD-10-CM | POA: Diagnosis not present

## 2021-03-21 DIAGNOSIS — N186 End stage renal disease: Secondary | ICD-10-CM | POA: Diagnosis not present

## 2021-03-21 DIAGNOSIS — Z992 Dependence on renal dialysis: Secondary | ICD-10-CM | POA: Diagnosis not present

## 2021-03-22 DIAGNOSIS — Z992 Dependence on renal dialysis: Secondary | ICD-10-CM | POA: Diagnosis not present

## 2021-03-22 DIAGNOSIS — N186 End stage renal disease: Secondary | ICD-10-CM | POA: Diagnosis not present

## 2021-03-23 DIAGNOSIS — Z992 Dependence on renal dialysis: Secondary | ICD-10-CM | POA: Diagnosis not present

## 2021-03-23 DIAGNOSIS — N186 End stage renal disease: Secondary | ICD-10-CM | POA: Diagnosis not present

## 2021-03-24 DIAGNOSIS — N186 End stage renal disease: Secondary | ICD-10-CM | POA: Diagnosis not present

## 2021-03-24 DIAGNOSIS — Z992 Dependence on renal dialysis: Secondary | ICD-10-CM | POA: Diagnosis not present

## 2021-03-25 DIAGNOSIS — N186 End stage renal disease: Secondary | ICD-10-CM | POA: Diagnosis not present

## 2021-03-25 DIAGNOSIS — Z992 Dependence on renal dialysis: Secondary | ICD-10-CM | POA: Diagnosis not present

## 2021-03-26 DIAGNOSIS — Z992 Dependence on renal dialysis: Secondary | ICD-10-CM | POA: Diagnosis not present

## 2021-03-26 DIAGNOSIS — N186 End stage renal disease: Secondary | ICD-10-CM | POA: Diagnosis not present

## 2021-03-27 DIAGNOSIS — N186 End stage renal disease: Secondary | ICD-10-CM | POA: Diagnosis not present

## 2021-03-27 DIAGNOSIS — Z992 Dependence on renal dialysis: Secondary | ICD-10-CM | POA: Diagnosis not present

## 2021-03-28 DIAGNOSIS — Z992 Dependence on renal dialysis: Secondary | ICD-10-CM | POA: Diagnosis not present

## 2021-03-28 DIAGNOSIS — N186 End stage renal disease: Secondary | ICD-10-CM | POA: Diagnosis not present

## 2021-03-29 DIAGNOSIS — N186 End stage renal disease: Secondary | ICD-10-CM | POA: Diagnosis not present

## 2021-03-29 DIAGNOSIS — Z992 Dependence on renal dialysis: Secondary | ICD-10-CM | POA: Diagnosis not present

## 2021-03-30 DIAGNOSIS — N186 End stage renal disease: Secondary | ICD-10-CM | POA: Diagnosis not present

## 2021-03-30 DIAGNOSIS — Z992 Dependence on renal dialysis: Secondary | ICD-10-CM | POA: Diagnosis not present

## 2021-03-31 DIAGNOSIS — H4311 Vitreous hemorrhage, right eye: Secondary | ICD-10-CM | POA: Diagnosis not present

## 2021-03-31 DIAGNOSIS — E113513 Type 2 diabetes mellitus with proliferative diabetic retinopathy with macular edema, bilateral: Secondary | ICD-10-CM | POA: Diagnosis not present

## 2021-03-31 DIAGNOSIS — H43811 Vitreous degeneration, right eye: Secondary | ICD-10-CM | POA: Diagnosis not present

## 2021-03-31 DIAGNOSIS — Z992 Dependence on renal dialysis: Secondary | ICD-10-CM | POA: Diagnosis not present

## 2021-03-31 DIAGNOSIS — N186 End stage renal disease: Secondary | ICD-10-CM | POA: Diagnosis not present

## 2021-03-31 DIAGNOSIS — H35363 Drusen (degenerative) of macula, bilateral: Secondary | ICD-10-CM | POA: Diagnosis not present

## 2021-04-01 DIAGNOSIS — Z992 Dependence on renal dialysis: Secondary | ICD-10-CM | POA: Diagnosis not present

## 2021-04-01 DIAGNOSIS — N186 End stage renal disease: Secondary | ICD-10-CM | POA: Diagnosis not present

## 2021-04-02 DIAGNOSIS — Z992 Dependence on renal dialysis: Secondary | ICD-10-CM | POA: Diagnosis not present

## 2021-04-02 DIAGNOSIS — N186 End stage renal disease: Secondary | ICD-10-CM | POA: Diagnosis not present

## 2021-04-03 DIAGNOSIS — Z992 Dependence on renal dialysis: Secondary | ICD-10-CM | POA: Diagnosis not present

## 2021-04-03 DIAGNOSIS — N186 End stage renal disease: Secondary | ICD-10-CM | POA: Diagnosis not present

## 2021-04-04 DIAGNOSIS — Z992 Dependence on renal dialysis: Secondary | ICD-10-CM | POA: Diagnosis not present

## 2021-04-04 DIAGNOSIS — N186 End stage renal disease: Secondary | ICD-10-CM | POA: Diagnosis not present

## 2021-04-05 DIAGNOSIS — Z992 Dependence on renal dialysis: Secondary | ICD-10-CM | POA: Diagnosis not present

## 2021-04-05 DIAGNOSIS — N186 End stage renal disease: Secondary | ICD-10-CM | POA: Diagnosis not present

## 2021-04-06 DIAGNOSIS — Z992 Dependence on renal dialysis: Secondary | ICD-10-CM | POA: Diagnosis not present

## 2021-04-06 DIAGNOSIS — N186 End stage renal disease: Secondary | ICD-10-CM | POA: Diagnosis not present

## 2021-04-07 DIAGNOSIS — N186 End stage renal disease: Secondary | ICD-10-CM | POA: Diagnosis not present

## 2021-04-07 DIAGNOSIS — Z992 Dependence on renal dialysis: Secondary | ICD-10-CM | POA: Diagnosis not present

## 2021-04-08 DIAGNOSIS — Z992 Dependence on renal dialysis: Secondary | ICD-10-CM | POA: Diagnosis not present

## 2021-04-08 DIAGNOSIS — H4311 Vitreous hemorrhage, right eye: Secondary | ICD-10-CM | POA: Diagnosis not present

## 2021-04-08 DIAGNOSIS — N186 End stage renal disease: Secondary | ICD-10-CM | POA: Diagnosis not present

## 2021-04-08 DIAGNOSIS — E113513 Type 2 diabetes mellitus with proliferative diabetic retinopathy with macular edema, bilateral: Secondary | ICD-10-CM | POA: Diagnosis not present

## 2021-04-09 ENCOUNTER — Other Ambulatory Visit (HOSPITAL_COMMUNITY): Payer: Self-pay

## 2021-04-09 DIAGNOSIS — Z992 Dependence on renal dialysis: Secondary | ICD-10-CM | POA: Diagnosis not present

## 2021-04-09 DIAGNOSIS — N186 End stage renal disease: Secondary | ICD-10-CM | POA: Diagnosis not present

## 2021-04-09 MED ORDER — OFLOXACIN 0.3 % OP SOLN
1.0000 [drp] | Freq: Four times a day (QID) | OPHTHALMIC | 5 refills | Status: DC
  Filled 2021-04-09: qty 5, 25d supply, fill #0

## 2021-04-09 MED ORDER — DIFLUPREDNATE 0.05 % OP EMUL
1.0000 [drp] | Freq: Two times a day (BID) | OPHTHALMIC | 5 refills | Status: DC
  Filled 2021-04-09: qty 5, 38d supply, fill #0

## 2021-04-10 ENCOUNTER — Other Ambulatory Visit (HOSPITAL_COMMUNITY): Payer: Self-pay

## 2021-04-10 DIAGNOSIS — N186 End stage renal disease: Secondary | ICD-10-CM | POA: Diagnosis not present

## 2021-04-10 DIAGNOSIS — Z992 Dependence on renal dialysis: Secondary | ICD-10-CM | POA: Diagnosis not present

## 2021-04-11 DIAGNOSIS — Z992 Dependence on renal dialysis: Secondary | ICD-10-CM | POA: Diagnosis not present

## 2021-04-11 DIAGNOSIS — N186 End stage renal disease: Secondary | ICD-10-CM | POA: Diagnosis not present

## 2021-04-12 DIAGNOSIS — Z992 Dependence on renal dialysis: Secondary | ICD-10-CM | POA: Diagnosis not present

## 2021-04-12 DIAGNOSIS — N186 End stage renal disease: Secondary | ICD-10-CM | POA: Diagnosis not present

## 2021-04-13 DIAGNOSIS — N186 End stage renal disease: Secondary | ICD-10-CM | POA: Diagnosis not present

## 2021-04-13 DIAGNOSIS — Z992 Dependence on renal dialysis: Secondary | ICD-10-CM | POA: Diagnosis not present

## 2021-04-14 ENCOUNTER — Other Ambulatory Visit (HOSPITAL_COMMUNITY): Payer: Self-pay

## 2021-04-14 ENCOUNTER — Ambulatory Visit (INDEPENDENT_AMBULATORY_CARE_PROVIDER_SITE_OTHER): Payer: 59 | Admitting: Endocrinology

## 2021-04-14 ENCOUNTER — Encounter: Payer: 59 | Attending: Endocrinology | Admitting: Nutrition

## 2021-04-14 ENCOUNTER — Other Ambulatory Visit: Payer: Self-pay

## 2021-04-14 ENCOUNTER — Encounter: Payer: Self-pay | Admitting: Endocrinology

## 2021-04-14 VITALS — BP 172/102 | HR 69 | Ht 71.0 in | Wt 260.6 lb

## 2021-04-14 DIAGNOSIS — Z79899 Other long term (current) drug therapy: Secondary | ICD-10-CM | POA: Diagnosis not present

## 2021-04-14 DIAGNOSIS — E785 Hyperlipidemia, unspecified: Secondary | ICD-10-CM | POA: Diagnosis not present

## 2021-04-14 DIAGNOSIS — N186 End stage renal disease: Secondary | ICD-10-CM | POA: Diagnosis not present

## 2021-04-14 DIAGNOSIS — Z5181 Encounter for therapeutic drug level monitoring: Secondary | ICD-10-CM | POA: Diagnosis not present

## 2021-04-14 DIAGNOSIS — E10649 Type 1 diabetes mellitus with hypoglycemia without coma: Secondary | ICD-10-CM | POA: Insufficient documentation

## 2021-04-14 DIAGNOSIS — D509 Iron deficiency anemia, unspecified: Secondary | ICD-10-CM | POA: Diagnosis not present

## 2021-04-14 DIAGNOSIS — I10 Essential (primary) hypertension: Secondary | ICD-10-CM

## 2021-04-14 DIAGNOSIS — E1065 Type 1 diabetes mellitus with hyperglycemia: Secondary | ICD-10-CM | POA: Diagnosis not present

## 2021-04-14 DIAGNOSIS — Z794 Long term (current) use of insulin: Secondary | ICD-10-CM | POA: Diagnosis not present

## 2021-04-14 DIAGNOSIS — E109 Type 1 diabetes mellitus without complications: Secondary | ICD-10-CM | POA: Diagnosis not present

## 2021-04-14 DIAGNOSIS — Z992 Dependence on renal dialysis: Secondary | ICD-10-CM | POA: Diagnosis not present

## 2021-04-14 MED ORDER — GABAPENTIN 300 MG PO CAPS
300.0000 mg | ORAL_CAPSULE | Freq: Two times a day (BID) | ORAL | 3 refills | Status: DC
  Filled 2021-04-14: qty 60, 30d supply, fill #0

## 2021-04-14 NOTE — Progress Notes (Signed)
[name] was trained in the automode for his 670 pump.  He was started in automode and he had no final questions.

## 2021-04-14 NOTE — Progress Notes (Signed)
Patient ID: [name], male   DOB: 03-Feb-1971, 50 y.o.   MRN: [id number]           Reason for Appointment : for Type 1 Diabetes  History of Present Illness   Referring HCP: Dr. Holley Raring         Diagnosis: Type 1 diabetes mellitus, date of diagnosis:  1997        Previous history:  He has been variously on insulin injections and insulin pumps since his diagnosis He has been on insulin pump on and off for the last few years with fair control  Recent history:   INSULIN pump settings with the MINIMED 670 G pump:  Basal rate #1: 12 AM = 0.3, 3 AM = 0.9, from 6 AM = 1.25, 12:30 PM = 1.5 and 8 PM = 1.6 Basal rate #2: 12 AM = 1.7, 8 AM = 1.4 and 6 PM = 1.8  Carbohydrate coverage 1: 7 at breakfast and suppertime and 1: 8 at lunch  Sensitivity 1: 30 except 1: 20 between 8 PM-8 AM Current target 120 and active insulin 3 hours  A1c on 10/14/2020 was 13.3 and is now 8.6  Current management, blood sugar patterns and problems identified:   Since his sensor was available about 2 months ago he has been using the manual mode Not clear why he has not turned on the auto mode With this he is finding that his sensor is sometimes reading falsely low He is generally checking his blood sugars with the fingerstick also at least 3-4 times a day With the suspend before low function he has the pump suspending almost daily Most of his suspension recently occur in the early morning hours and occasionally late evening On occasion he has suspended his pump manually during the night possibly from low normal readings for several hours, occasionally leading to high sugars Not clear why he is not entering his carbohydrates in the pump and he is bolusing Postprandial readings may be higher from late boluses and occasionally missed boluses  Last night he had overcorrection of his high blood sugar which was already decreasing from a previous bolus and not clear if he is getting periodic hypoglycemia from  overcorrection also  He does not do any formal exercise Walking during the day at work up to 4-5 miles total  Sensor download is interpreted as follows for the last 2 weeks on his guardian  Blood sugars are generally rising progressively between 9-10 AM until at least 10 PM and then variably high overnight Overnight blood sugars are inconsistent with some tendency to rise between 2-4 AM Highest blood sugars on an average are usually around 2-3 PM HYPERGLYCEMIC episodes are occurring around 2 PM, 5 PM and also 8 AM.  Pre and postprandial readings are difficult to judge as he is not entering carbohydrates frequently. Generally blood sugars are variable after meals and some of this may be occurring either at lunch or dinner  CGM use % of time 92  2-week average/GV 175+/-68   %  Time in range     58  % Time Above 180 26  % Time above 250 15  % Time Below 70 1     PRE-MEAL Fasting Lunch Dinner Bedtime Overall  Glucose range:       Averages: 130   206    Previous data:  Data from freestyle LIBRE:   Interpretation of his CGM shows the following for the few weeks His blood sugars are  extremely labile and variable from day-to-day Generally blood sugars are progressively higher from morning till mid afternoon and highest around 9 PM after dinner Frequently is showing significant blood sugar spikes at various times of the day although less during the night; sometimes may have only 1 significant spike in blood sugar but otherwise may have up to 4 spikes Usually blood sugars may be near normal or low normal by about midnight Hypoglycemia has been minimal with occasional low normal readings around 3 AM or 4-5 PM OVERNIGHT blood sugars are highly variable but generally tending to be lower around midnight and rarely higher the rest of the night  CGM use % of time  92  2-week average/GV  213  Time in range  43     %  % Time Above 180  23  % Time above 250  33  % Time Below 70 1      PRE-MEAL Fasting Lunch Dinner Bedtime Overall  Glucose range:       Averages:  190   206   213   POST-MEAL PC Breakfast PC Lunch PC Dinner  Glucose range:     Averages:  233  245  to 89      Self-care: Typical breakfast will be toast, eggs and bacon Usually lunch will be chicken, pasta and green beans Typical dinner: Steak asparagus and corn Snacks peanut butter crackers and chips   Mealtimes are: Dinner: 6-9 PM                Dietician consultation: Most recent: none.         CDE consultation: Years ago  Diabetes labs:  Lab Results  Component Value Date   HGBA1C 8.6 (H) 02/10/2021   HGBA1C 8.1 (H) 01/22/2021   HGBA1C 13.3 10/14/2020   Lab Results  Component Value Date   MICROALBUR 93.4 (H) 02/10/2021   LDLCALC 95 02/10/2021   CREATININE 7.74 (HH) 02/10/2021    Lab Results  Component Value Date   MICRALBCREAT 156.1 (H) 02/10/2021     Allergies as of 04/14/2021       Reactions   Sulfa Antibiotics Anaphylaxis, Swelling   Pt is not allergic to iodine, has had iodine in the past w/o premeds and w/ no problems   Oxycodone Nausea And Vomiting   Oxycontin [oxycodone Hcl] Nausea And Vomiting   Penicillins Other (See Comments)   Possible reaction many years ago per patient (Pt reports he has had pcn without issues since time of "reaction") Other reaction(s): Unknown   Prednisone Other (See Comments)   Patient is diabetic, runs sugar up Other reaction(s): Other (See Comments), Other (See Comments) Patient is diabetic, runs sugar up Patient is diabetic, runs sugar up Patient is diabetic, runs sugar up    Shellfish-derived Products Swelling   Eyes swell with shellfish Pt is not allergic to iodine, has had iodine in the past w/o premeds and w/ no problem        Medication List        Accurate as of April 14, 2021 10:47 AM. If you have any questions, ask your nurse or doctor.          acetaminophen 500 MG tablet Commonly known as: TYLENOL Take  500-1,000 mg by mouth every 6 (six) hours as needed for mild pain or fever.   amLODipine 10 MG tablet Commonly known as: NORVASC TAKE 1 TABLET BY MOUTH ONCE DAILY What changed: how much to take   atorvastatin 80 MG tablet  Commonly known as: LIPITOR TAKE 1 TABLET BY MOUTH ONCE DAILY What changed: how much to take   Auryxia 1 GM 210 MG(Fe) tablet Generic drug: ferric citrate TAKE 3 TABLETS BY MOUTH WITH MEALS AND 1 TABLET WITH SNACKS. DON'T EXCEED 11 TABLETS DAILY. What changed:  how much to take how to take this when to take this   carvedilol 25 MG tablet Commonly known as: COREG take one tablet by mouth 2 times daily with meals   cinacalcet 60 MG tablet Commonly known as: SENSIPAR Take 1 tablet (60 mg total) by mouth daily.   cloNIDine 0.1 MG tablet Commonly known as: CATAPRES Take 0.1 mg by mouth 2 (two) times daily.   cloNIDine 0.1 MG tablet Commonly known as: CATAPRES take one tablet by mouth 3 times daily   Difluprednate 0.05 % Emul Commonly known as: Durezol Place 1 drop into the right eye 2 (two) times daily. Begin 1 day after surgery and continue as directed.   FreeStyle Libre 2 Sensor Misc APPLY EVERY 14 DAYS.   gentamicin cream 0.1 % Commonly known as: GARAMYCIN Apply pea size amount to exit size daily.   Glucagon Emergency 1 MG Kit Inject into the muscle as directed for severe sugar.   HumaLOG 100 UNIT/ML injection Generic drug: insulin lispro Use up to 100 units total via insulin pump daily   losartan 100 MG tablet Commonly known as: COZAAR TAKE 1 TABLET BY MOUTH ONCE DAILY What changed: how much to take   ofloxacin 0.3 % ophthalmic solution Commonly known as: OCUFLOX Place 1 drop into the right eye 4 (four) times daily. Begin 1 day prior to surgery. Continue as directed.   polyethylene glycol 17 g packet Commonly known as: MiraLax Take 17 g by mouth daily.   Suprep Bowel Prep Kit 17.5-3.13-1.6 GM/177ML Soln Generic drug: Na Sulfate-K  Sulfate-Mg Sulf USE AS DIRECTED   torsemide 100 MG tablet Commonly known as: DEMADEX Take 1 tablet (100 mg total) by mouth 2 (two) times daily.   triamcinolone ointment 0.1 % Commonly known as: KENALOG APPLY TOPICALLY 2 TIMES A DAY What changed:  how much to take how to take this when to take this        Allergies:  Allergies  Allergen Reactions   Sulfa Antibiotics Anaphylaxis and Swelling    Pt is not allergic to iodine, has had iodine in the past w/o premeds and w/ no problems   Oxycodone Nausea And Vomiting   Oxycontin [Oxycodone Hcl] Nausea And Vomiting   Penicillins Other (See Comments)    Possible reaction many years ago per patient (Pt reports he has had pcn without issues since time of "reaction") Other reaction(s): Unknown   Prednisone Other (See Comments)    Patient is diabetic, runs sugar up Other reaction(s): Other (See Comments), Other (See Comments) Patient is diabetic, runs sugar up Patient is diabetic, runs sugar up Patient is diabetic, runs sugar up    Shellfish-Derived Products Swelling    Eyes swell with shellfish Pt is not allergic to iodine, has had iodine in the past w/o premeds and w/ no problem    Past Medical History:  Diagnosis Date   ESRD (end stage renal disease) ([place])    Gastroparesis    Herniated cervical disc    HTN (hypertension)    Hypercholesteremia    Morbid obesity ([place])    Peritoneal dialysis status ([place])    PONV (postoperative nausea and vomiting)    after peritoneal dialysis catheter was placed  S/P cardiac cath    a. 10-15 yrs ago at [facility] due to tachycardia, reportedly normal. b. Normal ETT 03/2012.   Sinus tachycardia    a. 24-hr Holter 03/2012 - SR, occ PVCs, no VT, avg HR 96bpm.   TIA (transient ischemic attack)    Type 1 diabetes mellitus ([place])    a. With insulin pump.    Past Surgical History:  Procedure Laterality Date   BIOPSY  10/03/2019   Procedure: BIOPSY;  Surgeon: Mauri Pole, MD;  Location: [facility]  ENDOSCOPY;  Service: Endoscopy;;   cervical neck fusion     COLONOSCOPY WITH PROPOFOL N/A 01/02/2021   Procedure: COLONOSCOPY WITH PROPOFOL;  Surgeon: [name], MD;  Location: [facility];  Service: Endoscopy;  Laterality: N/A;  priority 4 DIABETIC needs potassium draw   ESOPHAGOGASTRODUODENOSCOPY (EGD) WITH PROPOFOL N/A 10/03/2019   Procedure: ESOPHAGOGASTRODUODENOSCOPY (EGD) WITH PROPOFOL;  Surgeon: Mauri Pole, MD;  Location: [facility] ENDOSCOPY;  Service: Endoscopy;  Laterality: N/A;   HERNIA REPAIR     bilateral   LEFT HEART CATHETERIZATION WITH CORONARY ANGIOGRAM N/A 12/18/2013   Procedure: LEFT HEART CATHETERIZATION WITH CORONARY ANGIOGRAM;  Surgeon: [name] M Martinique, MD;  Location: [facility] C[facility];  Service: Cardiovascular;  Laterality: N/A;   POLYPECTOMY  01/02/2021   Procedure: POLYPECTOMY;  Surgeon: [name], MD;  Location: [facility];  Service: Endoscopy;;    Family History  Problem Relation Age of Onset   Hypertension Other    Coronary artery disease Other        Mother's side - both her side's grandparents died of heart disease (MIs)   Diabetes Other    Stroke Other        Paternal grandfather (82)   Prostate cancer Neg Hx    Bladder Cancer Neg Hx    Kidney cancer Neg Hx     Social History:  reports that he has never smoked. He has never used smokeless tobacco. He reports that he does not drink alcohol and does not use drugs.      Review of Systems      Lipids: Has been treated with 80 mg atorvastatin, recent labs as follows  Lab Results  Component Value Date   CHOL 178 02/10/2021   HDL 53.40 02/10/2021   LDLCALC 95 02/10/2021   TRIG 145.0 02/10/2021   CHOLHDL 3 02/10/2021    Last dilated eye exam was in 73/22  DIABETES COMPLICATIONS: Nephropathy, diabetic foot ulcers  NEUROPATHY: He says that periodically will have a lot of painful sensations in his legs from neuropathy with tingling and sharp pains.  May have had some relief from  gabapentin given during the hospital stay  Hypertension: He said that he is stressed today and blood pressure is high.  Usually managed by nephrologist, currently on clonidine, losartan, amlodipine and Coreg   Physical Examination:  BP (!) 172/102   Pulse 69   Ht 5' 11"  (1.803 m)   Wt 260 lb 9.6 oz (118.2 kg)   SpO2 98%   BMI 36.35 kg/m    ASSESSMENT:  Diabetes type 1, poorly controlled  A1c is last 8.6  Problems identified: He has significant variability in his blood sugars despite using the continuous sensor with his pump This is from not being in the auto mode Hypoglycemia has been usually prevented with the suspend before low However occasionally may have relatively low readings from overcorrection Likely since he is getting more frequent suspend events in the morning hours he has an  excessive basal rate Postprandial readings are inconsistent either from late or inadequate boluses Currently time in target is only 58%  Hypertension: He will follow-up with his nephrologist, needs to monitor closely at home also  PLAN:   He will need to start using the auto mode and will have the nurse educator help him with this today Enter carbohydrates with every meal while bolusing.  May bolus part of the amount only if he is not sure how much she will eat Likely needs to add extra for any higher fat meals May consider using temporary target mode when he is more active at work if he has tendency to low sugars Avoid stacking boluses and also not bolus for higher sugars of blood sugars are trending down especially overnight Basal rate at 6 AM = 1.15 and 10 AM = 1.50 No change in carb ratios as yet  Gabapentin 300-600 mg at bedtime as needed for neuropathy  There are no Patient Instructions on file for this visit.    Elayne Snare 04/14/2021, 10:47 AM   Total visit time including counseling = 30 minutes   Note: This note was prepared with Dragon voice recognition system  technology. Any transcriptional errors that result from this process are unintentional.

## 2021-04-14 NOTE — Patient Instructions (Signed)
Bolus for all meals, Calibrate sensor every 8 hours Respond to all alerts and alarms, and do what the pump tells you to do.

## 2021-04-14 NOTE — Patient Instructions (Signed)
Auto mode

## 2021-04-15 DIAGNOSIS — Z992 Dependence on renal dialysis: Secondary | ICD-10-CM | POA: Diagnosis not present

## 2021-04-15 DIAGNOSIS — N186 End stage renal disease: Secondary | ICD-10-CM | POA: Diagnosis not present

## 2021-04-16 DIAGNOSIS — N186 End stage renal disease: Secondary | ICD-10-CM | POA: Diagnosis not present

## 2021-04-16 DIAGNOSIS — H4311 Vitreous hemorrhage, right eye: Secondary | ICD-10-CM | POA: Diagnosis not present

## 2021-04-16 DIAGNOSIS — Z992 Dependence on renal dialysis: Secondary | ICD-10-CM | POA: Diagnosis not present

## 2021-04-16 DIAGNOSIS — E113511 Type 2 diabetes mellitus with proliferative diabetic retinopathy with macular edema, right eye: Secondary | ICD-10-CM | POA: Diagnosis not present

## 2021-04-17 DIAGNOSIS — Z992 Dependence on renal dialysis: Secondary | ICD-10-CM | POA: Diagnosis not present

## 2021-04-17 DIAGNOSIS — N186 End stage renal disease: Secondary | ICD-10-CM | POA: Diagnosis not present

## 2021-04-17 DIAGNOSIS — E113511 Type 2 diabetes mellitus with proliferative diabetic retinopathy with macular edema, right eye: Secondary | ICD-10-CM | POA: Diagnosis not present

## 2021-04-18 DIAGNOSIS — N186 End stage renal disease: Secondary | ICD-10-CM | POA: Diagnosis not present

## 2021-04-18 DIAGNOSIS — Z992 Dependence on renal dialysis: Secondary | ICD-10-CM | POA: Diagnosis not present

## 2021-04-19 DIAGNOSIS — Z992 Dependence on renal dialysis: Secondary | ICD-10-CM | POA: Diagnosis not present

## 2021-04-19 DIAGNOSIS — N186 End stage renal disease: Secondary | ICD-10-CM | POA: Diagnosis not present

## 2021-04-20 DIAGNOSIS — Z992 Dependence on renal dialysis: Secondary | ICD-10-CM | POA: Diagnosis not present

## 2021-04-20 DIAGNOSIS — N186 End stage renal disease: Secondary | ICD-10-CM | POA: Diagnosis not present

## 2021-04-21 DIAGNOSIS — Z992 Dependence on renal dialysis: Secondary | ICD-10-CM | POA: Diagnosis not present

## 2021-04-21 DIAGNOSIS — N186 End stage renal disease: Secondary | ICD-10-CM | POA: Diagnosis not present

## 2021-04-22 DIAGNOSIS — Z992 Dependence on renal dialysis: Secondary | ICD-10-CM | POA: Diagnosis not present

## 2021-04-22 DIAGNOSIS — N186 End stage renal disease: Secondary | ICD-10-CM | POA: Diagnosis not present

## 2021-04-23 DIAGNOSIS — Z992 Dependence on renal dialysis: Secondary | ICD-10-CM | POA: Diagnosis not present

## 2021-04-23 DIAGNOSIS — N186 End stage renal disease: Secondary | ICD-10-CM | POA: Diagnosis not present

## 2021-04-24 DIAGNOSIS — E113513 Type 2 diabetes mellitus with proliferative diabetic retinopathy with macular edema, bilateral: Secondary | ICD-10-CM | POA: Diagnosis not present

## 2021-04-24 DIAGNOSIS — Z992 Dependence on renal dialysis: Secondary | ICD-10-CM | POA: Diagnosis not present

## 2021-04-24 DIAGNOSIS — N186 End stage renal disease: Secondary | ICD-10-CM | POA: Diagnosis not present

## 2021-04-25 DIAGNOSIS — N186 End stage renal disease: Secondary | ICD-10-CM | POA: Diagnosis not present

## 2021-04-25 DIAGNOSIS — Z992 Dependence on renal dialysis: Secondary | ICD-10-CM | POA: Diagnosis not present

## 2021-04-26 DIAGNOSIS — Z992 Dependence on renal dialysis: Secondary | ICD-10-CM | POA: Diagnosis not present

## 2021-04-26 DIAGNOSIS — N186 End stage renal disease: Secondary | ICD-10-CM | POA: Diagnosis not present

## 2021-04-27 DIAGNOSIS — N186 End stage renal disease: Secondary | ICD-10-CM | POA: Diagnosis not present

## 2021-04-27 DIAGNOSIS — Z992 Dependence on renal dialysis: Secondary | ICD-10-CM | POA: Diagnosis not present

## 2021-04-28 DIAGNOSIS — N186 End stage renal disease: Secondary | ICD-10-CM | POA: Diagnosis not present

## 2021-04-28 DIAGNOSIS — Z992 Dependence on renal dialysis: Secondary | ICD-10-CM | POA: Diagnosis not present

## 2021-04-29 DIAGNOSIS — Z992 Dependence on renal dialysis: Secondary | ICD-10-CM | POA: Diagnosis not present

## 2021-04-29 DIAGNOSIS — N186 End stage renal disease: Secondary | ICD-10-CM | POA: Diagnosis not present

## 2021-04-30 DIAGNOSIS — Z992 Dependence on renal dialysis: Secondary | ICD-10-CM | POA: Diagnosis not present

## 2021-04-30 DIAGNOSIS — N186 End stage renal disease: Secondary | ICD-10-CM | POA: Diagnosis not present

## 2021-05-01 DIAGNOSIS — Z992 Dependence on renal dialysis: Secondary | ICD-10-CM | POA: Diagnosis not present

## 2021-05-01 DIAGNOSIS — N186 End stage renal disease: Secondary | ICD-10-CM | POA: Diagnosis not present

## 2021-05-02 DIAGNOSIS — Z992 Dependence on renal dialysis: Secondary | ICD-10-CM | POA: Diagnosis not present

## 2021-05-02 DIAGNOSIS — N186 End stage renal disease: Secondary | ICD-10-CM | POA: Diagnosis not present

## 2021-05-03 DIAGNOSIS — N186 End stage renal disease: Secondary | ICD-10-CM | POA: Diagnosis not present

## 2021-05-03 DIAGNOSIS — Z992 Dependence on renal dialysis: Secondary | ICD-10-CM | POA: Diagnosis not present

## 2021-05-04 DIAGNOSIS — Z992 Dependence on renal dialysis: Secondary | ICD-10-CM | POA: Diagnosis not present

## 2021-05-04 DIAGNOSIS — N186 End stage renal disease: Secondary | ICD-10-CM | POA: Diagnosis not present

## 2021-05-05 DIAGNOSIS — Z992 Dependence on renal dialysis: Secondary | ICD-10-CM | POA: Diagnosis not present

## 2021-05-05 DIAGNOSIS — D509 Iron deficiency anemia, unspecified: Secondary | ICD-10-CM | POA: Diagnosis not present

## 2021-05-05 DIAGNOSIS — N186 End stage renal disease: Secondary | ICD-10-CM | POA: Diagnosis not present

## 2021-05-06 DIAGNOSIS — Z992 Dependence on renal dialysis: Secondary | ICD-10-CM | POA: Diagnosis not present

## 2021-05-06 DIAGNOSIS — N186 End stage renal disease: Secondary | ICD-10-CM | POA: Diagnosis not present

## 2021-05-07 DIAGNOSIS — N186 End stage renal disease: Secondary | ICD-10-CM | POA: Diagnosis not present

## 2021-05-07 DIAGNOSIS — Z992 Dependence on renal dialysis: Secondary | ICD-10-CM | POA: Diagnosis not present

## 2021-05-08 DIAGNOSIS — Z992 Dependence on renal dialysis: Secondary | ICD-10-CM | POA: Diagnosis not present

## 2021-05-08 DIAGNOSIS — N186 End stage renal disease: Secondary | ICD-10-CM | POA: Diagnosis not present

## 2021-05-09 DIAGNOSIS — Z992 Dependence on renal dialysis: Secondary | ICD-10-CM | POA: Diagnosis not present

## 2021-05-09 DIAGNOSIS — N186 End stage renal disease: Secondary | ICD-10-CM | POA: Diagnosis not present

## 2021-05-10 DIAGNOSIS — Z992 Dependence on renal dialysis: Secondary | ICD-10-CM | POA: Diagnosis not present

## 2021-05-10 DIAGNOSIS — N186 End stage renal disease: Secondary | ICD-10-CM | POA: Diagnosis not present

## 2021-05-11 DIAGNOSIS — Z992 Dependence on renal dialysis: Secondary | ICD-10-CM | POA: Diagnosis not present

## 2021-05-11 DIAGNOSIS — N186 End stage renal disease: Secondary | ICD-10-CM | POA: Diagnosis not present

## 2021-05-12 DIAGNOSIS — N186 End stage renal disease: Secondary | ICD-10-CM | POA: Diagnosis not present

## 2021-05-12 DIAGNOSIS — Z992 Dependence on renal dialysis: Secondary | ICD-10-CM | POA: Diagnosis not present

## 2021-05-13 DIAGNOSIS — N186 End stage renal disease: Secondary | ICD-10-CM | POA: Diagnosis not present

## 2021-05-13 DIAGNOSIS — Z992 Dependence on renal dialysis: Secondary | ICD-10-CM | POA: Diagnosis not present

## 2021-05-14 DIAGNOSIS — Z992 Dependence on renal dialysis: Secondary | ICD-10-CM | POA: Diagnosis not present

## 2021-05-14 DIAGNOSIS — N186 End stage renal disease: Secondary | ICD-10-CM | POA: Diagnosis not present

## 2021-05-15 DIAGNOSIS — N186 End stage renal disease: Secondary | ICD-10-CM | POA: Diagnosis not present

## 2021-05-15 DIAGNOSIS — E113513 Type 2 diabetes mellitus with proliferative diabetic retinopathy with macular edema, bilateral: Secondary | ICD-10-CM | POA: Diagnosis not present

## 2021-05-15 DIAGNOSIS — Z992 Dependence on renal dialysis: Secondary | ICD-10-CM | POA: Diagnosis not present

## 2021-05-16 DIAGNOSIS — N186 End stage renal disease: Secondary | ICD-10-CM | POA: Diagnosis not present

## 2021-05-16 DIAGNOSIS — Z992 Dependence on renal dialysis: Secondary | ICD-10-CM | POA: Diagnosis not present

## 2021-05-17 DIAGNOSIS — Z992 Dependence on renal dialysis: Secondary | ICD-10-CM | POA: Diagnosis not present

## 2021-05-17 DIAGNOSIS — N186 End stage renal disease: Secondary | ICD-10-CM | POA: Diagnosis not present

## 2021-05-18 DIAGNOSIS — Z992 Dependence on renal dialysis: Secondary | ICD-10-CM | POA: Diagnosis not present

## 2021-05-18 DIAGNOSIS — N186 End stage renal disease: Secondary | ICD-10-CM | POA: Diagnosis not present

## 2021-05-19 DIAGNOSIS — N186 End stage renal disease: Secondary | ICD-10-CM | POA: Diagnosis not present

## 2021-05-19 DIAGNOSIS — Z992 Dependence on renal dialysis: Secondary | ICD-10-CM | POA: Diagnosis not present

## 2021-05-20 DIAGNOSIS — N186 End stage renal disease: Secondary | ICD-10-CM | POA: Diagnosis not present

## 2021-05-20 DIAGNOSIS — Z992 Dependence on renal dialysis: Secondary | ICD-10-CM | POA: Diagnosis not present

## 2021-05-21 DIAGNOSIS — N186 End stage renal disease: Secondary | ICD-10-CM | POA: Diagnosis not present

## 2021-05-21 DIAGNOSIS — Z992 Dependence on renal dialysis: Secondary | ICD-10-CM | POA: Diagnosis not present

## 2021-05-22 DIAGNOSIS — Z992 Dependence on renal dialysis: Secondary | ICD-10-CM | POA: Diagnosis not present

## 2021-05-22 DIAGNOSIS — N186 End stage renal disease: Secondary | ICD-10-CM | POA: Diagnosis not present

## 2021-05-23 DIAGNOSIS — N186 End stage renal disease: Secondary | ICD-10-CM | POA: Diagnosis not present

## 2021-05-23 DIAGNOSIS — Z992 Dependence on renal dialysis: Secondary | ICD-10-CM | POA: Diagnosis not present

## 2021-05-24 DIAGNOSIS — N186 End stage renal disease: Secondary | ICD-10-CM | POA: Diagnosis not present

## 2021-05-24 DIAGNOSIS — Z992 Dependence on renal dialysis: Secondary | ICD-10-CM | POA: Diagnosis not present

## 2021-05-25 DIAGNOSIS — Z992 Dependence on renal dialysis: Secondary | ICD-10-CM | POA: Diagnosis not present

## 2021-05-25 DIAGNOSIS — N186 End stage renal disease: Secondary | ICD-10-CM | POA: Diagnosis not present

## 2021-05-26 DIAGNOSIS — Z992 Dependence on renal dialysis: Secondary | ICD-10-CM | POA: Diagnosis not present

## 2021-05-26 DIAGNOSIS — N186 End stage renal disease: Secondary | ICD-10-CM | POA: Diagnosis not present

## 2021-05-27 DIAGNOSIS — N186 End stage renal disease: Secondary | ICD-10-CM | POA: Diagnosis not present

## 2021-05-27 DIAGNOSIS — Z992 Dependence on renal dialysis: Secondary | ICD-10-CM | POA: Diagnosis not present

## 2021-05-28 DIAGNOSIS — Z992 Dependence on renal dialysis: Secondary | ICD-10-CM | POA: Diagnosis not present

## 2021-05-28 DIAGNOSIS — N186 End stage renal disease: Secondary | ICD-10-CM | POA: Diagnosis not present

## 2021-05-29 DIAGNOSIS — Z992 Dependence on renal dialysis: Secondary | ICD-10-CM | POA: Diagnosis not present

## 2021-05-29 DIAGNOSIS — N186 End stage renal disease: Secondary | ICD-10-CM | POA: Diagnosis not present

## 2021-05-30 DIAGNOSIS — N186 End stage renal disease: Secondary | ICD-10-CM | POA: Diagnosis not present

## 2021-05-30 DIAGNOSIS — Z992 Dependence on renal dialysis: Secondary | ICD-10-CM | POA: Diagnosis not present

## 2021-05-31 DIAGNOSIS — N186 End stage renal disease: Secondary | ICD-10-CM | POA: Diagnosis not present

## 2021-05-31 DIAGNOSIS — Z992 Dependence on renal dialysis: Secondary | ICD-10-CM | POA: Diagnosis not present

## 2021-06-01 DIAGNOSIS — N186 End stage renal disease: Secondary | ICD-10-CM | POA: Diagnosis not present

## 2021-06-01 DIAGNOSIS — Z992 Dependence on renal dialysis: Secondary | ICD-10-CM | POA: Diagnosis not present

## 2021-06-02 DIAGNOSIS — Z992 Dependence on renal dialysis: Secondary | ICD-10-CM | POA: Diagnosis not present

## 2021-06-02 DIAGNOSIS — N186 End stage renal disease: Secondary | ICD-10-CM | POA: Diagnosis not present

## 2021-06-03 DIAGNOSIS — N186 End stage renal disease: Secondary | ICD-10-CM | POA: Diagnosis not present

## 2021-06-03 DIAGNOSIS — Z992 Dependence on renal dialysis: Secondary | ICD-10-CM | POA: Diagnosis not present

## 2021-06-04 DIAGNOSIS — N186 End stage renal disease: Secondary | ICD-10-CM | POA: Diagnosis not present

## 2021-06-04 DIAGNOSIS — Z992 Dependence on renal dialysis: Secondary | ICD-10-CM | POA: Diagnosis not present

## 2021-06-05 DIAGNOSIS — N186 End stage renal disease: Secondary | ICD-10-CM | POA: Diagnosis not present

## 2021-06-05 DIAGNOSIS — Z992 Dependence on renal dialysis: Secondary | ICD-10-CM | POA: Diagnosis not present

## 2021-06-06 DIAGNOSIS — Z992 Dependence on renal dialysis: Secondary | ICD-10-CM | POA: Diagnosis not present

## 2021-06-06 DIAGNOSIS — N186 End stage renal disease: Secondary | ICD-10-CM | POA: Diagnosis not present

## 2021-06-07 DIAGNOSIS — Z992 Dependence on renal dialysis: Secondary | ICD-10-CM | POA: Diagnosis not present

## 2021-06-07 DIAGNOSIS — N186 End stage renal disease: Secondary | ICD-10-CM | POA: Diagnosis not present

## 2021-06-08 DIAGNOSIS — Z992 Dependence on renal dialysis: Secondary | ICD-10-CM | POA: Diagnosis not present

## 2021-06-08 DIAGNOSIS — N186 End stage renal disease: Secondary | ICD-10-CM | POA: Diagnosis not present

## 2021-06-09 DIAGNOSIS — Z992 Dependence on renal dialysis: Secondary | ICD-10-CM | POA: Diagnosis not present

## 2021-06-09 DIAGNOSIS — N186 End stage renal disease: Secondary | ICD-10-CM | POA: Diagnosis not present

## 2021-06-10 ENCOUNTER — Other Ambulatory Visit: Payer: 59

## 2021-06-10 DIAGNOSIS — Z992 Dependence on renal dialysis: Secondary | ICD-10-CM | POA: Diagnosis not present

## 2021-06-10 DIAGNOSIS — D509 Iron deficiency anemia, unspecified: Secondary | ICD-10-CM | POA: Diagnosis not present

## 2021-06-10 DIAGNOSIS — N186 End stage renal disease: Secondary | ICD-10-CM | POA: Diagnosis not present

## 2021-06-11 DIAGNOSIS — Z992 Dependence on renal dialysis: Secondary | ICD-10-CM | POA: Diagnosis not present

## 2021-06-11 DIAGNOSIS — N186 End stage renal disease: Secondary | ICD-10-CM | POA: Diagnosis not present

## 2021-06-12 ENCOUNTER — Other Ambulatory Visit: Payer: Self-pay | Admitting: Endocrinology

## 2021-06-12 ENCOUNTER — Other Ambulatory Visit (HOSPITAL_COMMUNITY): Payer: Self-pay

## 2021-06-12 DIAGNOSIS — N186 End stage renal disease: Secondary | ICD-10-CM | POA: Diagnosis not present

## 2021-06-12 DIAGNOSIS — Z992 Dependence on renal dialysis: Secondary | ICD-10-CM | POA: Diagnosis not present

## 2021-06-13 ENCOUNTER — Other Ambulatory Visit (HOSPITAL_COMMUNITY): Payer: Self-pay

## 2021-06-13 DIAGNOSIS — N186 End stage renal disease: Secondary | ICD-10-CM | POA: Diagnosis not present

## 2021-06-13 DIAGNOSIS — Z992 Dependence on renal dialysis: Secondary | ICD-10-CM | POA: Diagnosis not present

## 2021-06-14 DIAGNOSIS — N186 End stage renal disease: Secondary | ICD-10-CM | POA: Diagnosis not present

## 2021-06-14 DIAGNOSIS — Z992 Dependence on renal dialysis: Secondary | ICD-10-CM | POA: Diagnosis not present

## 2021-06-15 ENCOUNTER — Other Ambulatory Visit (HOSPITAL_COMMUNITY): Payer: Self-pay

## 2021-06-15 DIAGNOSIS — N186 End stage renal disease: Secondary | ICD-10-CM | POA: Diagnosis not present

## 2021-06-15 DIAGNOSIS — Z992 Dependence on renal dialysis: Secondary | ICD-10-CM | POA: Diagnosis not present

## 2021-06-16 ENCOUNTER — Ambulatory Visit: Payer: 59 | Admitting: Endocrinology

## 2021-06-16 DIAGNOSIS — N186 End stage renal disease: Secondary | ICD-10-CM | POA: Diagnosis not present

## 2021-06-16 DIAGNOSIS — Z992 Dependence on renal dialysis: Secondary | ICD-10-CM | POA: Diagnosis not present

## 2021-06-17 ENCOUNTER — Other Ambulatory Visit (HOSPITAL_COMMUNITY): Payer: Self-pay | Admitting: Psychiatry

## 2021-06-17 ENCOUNTER — Other Ambulatory Visit (HOSPITAL_COMMUNITY): Payer: Self-pay

## 2021-06-17 DIAGNOSIS — Z992 Dependence on renal dialysis: Secondary | ICD-10-CM | POA: Diagnosis not present

## 2021-06-17 DIAGNOSIS — N186 End stage renal disease: Secondary | ICD-10-CM | POA: Diagnosis not present

## 2021-06-18 DIAGNOSIS — N186 End stage renal disease: Secondary | ICD-10-CM | POA: Diagnosis not present

## 2021-06-18 DIAGNOSIS — Z992 Dependence on renal dialysis: Secondary | ICD-10-CM | POA: Diagnosis not present

## 2021-06-19 DIAGNOSIS — Z992 Dependence on renal dialysis: Secondary | ICD-10-CM | POA: Diagnosis not present

## 2021-06-19 DIAGNOSIS — N186 End stage renal disease: Secondary | ICD-10-CM | POA: Diagnosis not present

## 2021-06-20 DIAGNOSIS — Z992 Dependence on renal dialysis: Secondary | ICD-10-CM | POA: Diagnosis not present

## 2021-06-20 DIAGNOSIS — N186 End stage renal disease: Secondary | ICD-10-CM | POA: Diagnosis not present

## 2021-06-21 DIAGNOSIS — Z992 Dependence on renal dialysis: Secondary | ICD-10-CM | POA: Diagnosis not present

## 2021-06-21 DIAGNOSIS — N186 End stage renal disease: Secondary | ICD-10-CM | POA: Diagnosis not present

## 2021-06-22 ENCOUNTER — Other Ambulatory Visit (HOSPITAL_COMMUNITY): Payer: Self-pay

## 2021-06-22 DIAGNOSIS — Z992 Dependence on renal dialysis: Secondary | ICD-10-CM | POA: Diagnosis not present

## 2021-06-22 DIAGNOSIS — N186 End stage renal disease: Secondary | ICD-10-CM | POA: Diagnosis not present

## 2021-06-23 ENCOUNTER — Other Ambulatory Visit (HOSPITAL_COMMUNITY): Payer: Self-pay

## 2021-06-23 DIAGNOSIS — N186 End stage renal disease: Secondary | ICD-10-CM | POA: Diagnosis not present

## 2021-06-23 DIAGNOSIS — Z992 Dependence on renal dialysis: Secondary | ICD-10-CM | POA: Diagnosis not present

## 2021-06-24 ENCOUNTER — Other Ambulatory Visit (HOSPITAL_COMMUNITY): Payer: Self-pay

## 2021-06-24 DIAGNOSIS — N186 End stage renal disease: Secondary | ICD-10-CM | POA: Diagnosis not present

## 2021-06-24 DIAGNOSIS — Z992 Dependence on renal dialysis: Secondary | ICD-10-CM | POA: Diagnosis not present

## 2021-06-25 ENCOUNTER — Other Ambulatory Visit (HOSPITAL_COMMUNITY): Payer: Self-pay

## 2021-06-25 DIAGNOSIS — N186 End stage renal disease: Secondary | ICD-10-CM | POA: Diagnosis not present

## 2021-06-25 DIAGNOSIS — Z992 Dependence on renal dialysis: Secondary | ICD-10-CM | POA: Diagnosis not present

## 2021-06-25 NOTE — Telephone Encounter (Signed)
Not under my care

## 2021-06-26 DIAGNOSIS — Z992 Dependence on renal dialysis: Secondary | ICD-10-CM | POA: Diagnosis not present

## 2021-06-26 DIAGNOSIS — N186 End stage renal disease: Secondary | ICD-10-CM | POA: Diagnosis not present

## 2021-06-27 DIAGNOSIS — N186 End stage renal disease: Secondary | ICD-10-CM | POA: Diagnosis not present

## 2021-06-27 DIAGNOSIS — Z992 Dependence on renal dialysis: Secondary | ICD-10-CM | POA: Diagnosis not present

## 2021-06-28 DIAGNOSIS — Z992 Dependence on renal dialysis: Secondary | ICD-10-CM | POA: Diagnosis not present

## 2021-06-28 DIAGNOSIS — N186 End stage renal disease: Secondary | ICD-10-CM | POA: Diagnosis not present

## 2021-06-29 DIAGNOSIS — Z992 Dependence on renal dialysis: Secondary | ICD-10-CM | POA: Diagnosis not present

## 2021-06-29 DIAGNOSIS — N186 End stage renal disease: Secondary | ICD-10-CM | POA: Diagnosis not present

## 2021-06-30 ENCOUNTER — Other Ambulatory Visit (HOSPITAL_COMMUNITY): Payer: Self-pay

## 2021-06-30 ENCOUNTER — Other Ambulatory Visit: Payer: Self-pay | Admitting: Endocrinology

## 2021-06-30 DIAGNOSIS — Z992 Dependence on renal dialysis: Secondary | ICD-10-CM | POA: Diagnosis not present

## 2021-06-30 DIAGNOSIS — N186 End stage renal disease: Secondary | ICD-10-CM | POA: Diagnosis not present

## 2021-06-30 NOTE — Telephone Encounter (Signed)
Patient called to follow up on the request from the pharmacy.  Also to advise that this goes to [facility] as a 90 day prescription. He is completely out and that this is a second request for the Humalog from the pharmacy

## 2021-07-01 ENCOUNTER — Other Ambulatory Visit (HOSPITAL_COMMUNITY): Payer: Self-pay

## 2021-07-01 DIAGNOSIS — Z992 Dependence on renal dialysis: Secondary | ICD-10-CM | POA: Diagnosis not present

## 2021-07-01 DIAGNOSIS — N186 End stage renal disease: Secondary | ICD-10-CM | POA: Diagnosis not present

## 2021-07-01 MED ORDER — INSULIN LISPRO 100 UNIT/ML IJ SOLN
INTRAMUSCULAR | 0 refills | Status: DC
  Filled 2021-07-01: qty 90, 90d supply, fill #0

## 2021-07-02 ENCOUNTER — Other Ambulatory Visit (HOSPITAL_COMMUNITY): Payer: Self-pay

## 2021-07-02 ENCOUNTER — Telehealth: Payer: Self-pay | Admitting: Podiatry

## 2021-07-02 DIAGNOSIS — Z992 Dependence on renal dialysis: Secondary | ICD-10-CM | POA: Diagnosis not present

## 2021-07-02 DIAGNOSIS — N186 End stage renal disease: Secondary | ICD-10-CM | POA: Diagnosis not present

## 2021-07-02 NOTE — Telephone Encounter (Signed)
Patient called and stated that his ulcer has developed back on his great toe and only wanted to see Dr. Amalia Hailey. He would like to be worked in on Monday sometime.  Please advise

## 2021-07-03 DIAGNOSIS — Z992 Dependence on renal dialysis: Secondary | ICD-10-CM | POA: Diagnosis not present

## 2021-07-03 DIAGNOSIS — N186 End stage renal disease: Secondary | ICD-10-CM | POA: Diagnosis not present

## 2021-07-04 DIAGNOSIS — Z992 Dependence on renal dialysis: Secondary | ICD-10-CM | POA: Diagnosis not present

## 2021-07-04 DIAGNOSIS — N186 End stage renal disease: Secondary | ICD-10-CM | POA: Diagnosis not present

## 2021-07-05 DIAGNOSIS — N186 End stage renal disease: Secondary | ICD-10-CM | POA: Diagnosis not present

## 2021-07-05 DIAGNOSIS — Z992 Dependence on renal dialysis: Secondary | ICD-10-CM | POA: Diagnosis not present

## 2021-07-06 DIAGNOSIS — N186 End stage renal disease: Secondary | ICD-10-CM | POA: Diagnosis not present

## 2021-07-06 DIAGNOSIS — Z992 Dependence on renal dialysis: Secondary | ICD-10-CM | POA: Diagnosis not present

## 2021-07-07 ENCOUNTER — Ambulatory Visit (INDEPENDENT_AMBULATORY_CARE_PROVIDER_SITE_OTHER): Payer: 59

## 2021-07-07 ENCOUNTER — Encounter: Payer: Self-pay | Admitting: Podiatry

## 2021-07-07 ENCOUNTER — Other Ambulatory Visit: Payer: Self-pay | Admitting: Podiatry

## 2021-07-07 ENCOUNTER — Other Ambulatory Visit: Payer: Self-pay

## 2021-07-07 ENCOUNTER — Ambulatory Visit: Payer: 59 | Admitting: Podiatry

## 2021-07-07 DIAGNOSIS — L97522 Non-pressure chronic ulcer of other part of left foot with fat layer exposed: Secondary | ICD-10-CM

## 2021-07-07 DIAGNOSIS — N186 End stage renal disease: Secondary | ICD-10-CM | POA: Diagnosis not present

## 2021-07-07 DIAGNOSIS — E0843 Diabetes mellitus due to underlying condition with diabetic autonomic (poly)neuropathy: Secondary | ICD-10-CM

## 2021-07-07 DIAGNOSIS — Z992 Dependence on renal dialysis: Secondary | ICD-10-CM | POA: Diagnosis not present

## 2021-07-07 MED ORDER — DOXYCYCLINE HYCLATE 100 MG PO TABS
100.0000 mg | ORAL_TABLET | Freq: Two times a day (BID) | ORAL | 0 refills | Status: DC
Start: 2021-07-07 — End: 2021-10-06
  Filled 2021-07-07: qty 20, 10d supply, fill #0

## 2021-07-07 NOTE — Progress Notes (Signed)
   Subjective:  Patient presents today for follow-up evaluation of an ulcer to the left hallux.  Patient was last seen in the office on 10/04/2020.  Patient states that he was doing very well until he noticed a blister with some drainage to the plantar aspect of the toe.  He has been doing an increased amount of work lately.  He works maintenance at [employer].  He presents for further treatment and evaluation  Past Medical History:  Diagnosis Date   ESRD (end stage renal disease) ([place])    Gastroparesis    Herniated cervical disc    HTN (hypertension)    Hypercholesteremia    Morbid obesity ([place])    Peritoneal dialysis status ([place])    PONV (postoperative nausea and vomiting)    after peritoneal dialysis catheter was placed   S/P cardiac cath    a. 10-15 yrs ago at [facility] due to tachycardia, reportedly normal. b. Normal ETT 03/2012.   Sinus tachycardia    a. 24-hr Holter 03/2012 - SR, occ PVCs, no VT, avg HR 96bpm.   TIA (transient ischemic attack)    Type 1 diabetes mellitus ([place])    a. With insulin pump.       Objective/Physical Exam Neurovascular status intact. Maceration noted to the incision site has improved significantly.   Wound #1 noted to the left hallux measuring 1.0x1.0 x 0.2 cm.   To the above-noted ulceration, there is no eschar. There is a moderate amount of slough, fibrin and necrotic tissue. Granulation tissue and wound base is red. There is no malodor.  There is some increased erythema and edema around the ulceration site with increased drainage.  Periwound is slightly macerated.  There is no exposed bone muscle tendon ligament or joint.   Radiographic exam Normal osseous mineralization.  No degenerative changes noted that would be concerning for osteomyelitis.  Cortices intact.  History of planing of the plantar aspect of the hallux phalanges.  Assessment: 1. s/p exostectomy left hallux. DOS: 11/16/2018 2. Ulceration of the left hallux secondary to diabetes  mellitus   Plan of Care:  1. Patient was evaluated. 2.  Medically necessary excisional debridement including subcutaneous tissue was performed using a tissue nipper.  Excisional debridement of all necrotic nonviable tissue down to the healthy bleeding viable tissue was performed with post debridement measurement same as pre- 3.   Cultures taken and sent to pathology for culture and sensitivity 4.  Prescription for doxycycline 100 mg 2 times daily #20 5.  Cam boot dispensed.  Weightbearing as tolerated 6.  Note for work was provided today.  No work today.  Light duty only x4 weeks 7.  Return to clinic in 3 weeks  Edrick Kins, DPM [facility]  Dr. Edrick Kins, DPM    [address], [place] [address]                Office [phone]  Fax [phone]

## 2021-07-08 DIAGNOSIS — Z992 Dependence on renal dialysis: Secondary | ICD-10-CM | POA: Diagnosis not present

## 2021-07-08 DIAGNOSIS — N186 End stage renal disease: Secondary | ICD-10-CM | POA: Diagnosis not present

## 2021-07-09 DIAGNOSIS — Z992 Dependence on renal dialysis: Secondary | ICD-10-CM | POA: Diagnosis not present

## 2021-07-09 DIAGNOSIS — N186 End stage renal disease: Secondary | ICD-10-CM | POA: Diagnosis not present

## 2021-07-10 DIAGNOSIS — N186 End stage renal disease: Secondary | ICD-10-CM | POA: Diagnosis not present

## 2021-07-10 DIAGNOSIS — Z992 Dependence on renal dialysis: Secondary | ICD-10-CM | POA: Diagnosis not present

## 2021-07-11 ENCOUNTER — Other Ambulatory Visit (HOSPITAL_COMMUNITY): Payer: Self-pay

## 2021-07-11 DIAGNOSIS — D649 Anemia, unspecified: Secondary | ICD-10-CM | POA: Diagnosis not present

## 2021-07-11 DIAGNOSIS — E1110 Type 2 diabetes mellitus with ketoacidosis without coma: Secondary | ICD-10-CM | POA: Diagnosis not present

## 2021-07-11 DIAGNOSIS — D631 Anemia in chronic kidney disease: Secondary | ICD-10-CM | POA: Diagnosis not present

## 2021-07-11 DIAGNOSIS — E871 Hypo-osmolality and hyponatremia: Secondary | ICD-10-CM | POA: Diagnosis not present

## 2021-07-11 DIAGNOSIS — Z9641 Presence of insulin pump (external) (internal): Secondary | ICD-10-CM | POA: Diagnosis not present

## 2021-07-11 DIAGNOSIS — E162 Hypoglycemia, unspecified: Secondary | ICD-10-CM | POA: Diagnosis not present

## 2021-07-11 DIAGNOSIS — R110 Nausea: Secondary | ICD-10-CM | POA: Diagnosis not present

## 2021-07-11 DIAGNOSIS — K529 Noninfective gastroenteritis and colitis, unspecified: Secondary | ICD-10-CM | POA: Diagnosis not present

## 2021-07-11 DIAGNOSIS — R112 Nausea with vomiting, unspecified: Secondary | ICD-10-CM | POA: Diagnosis not present

## 2021-07-11 DIAGNOSIS — E1010 Type 1 diabetes mellitus with ketoacidosis without coma: Secondary | ICD-10-CM | POA: Diagnosis not present

## 2021-07-11 DIAGNOSIS — Z20822 Contact with and (suspected) exposure to covid-19: Secondary | ICD-10-CM | POA: Diagnosis not present

## 2021-07-11 DIAGNOSIS — G9341 Metabolic encephalopathy: Secondary | ICD-10-CM | POA: Diagnosis not present

## 2021-07-11 DIAGNOSIS — E1022 Type 1 diabetes mellitus with diabetic chronic kidney disease: Secondary | ICD-10-CM | POA: Diagnosis not present

## 2021-07-11 DIAGNOSIS — J9601 Acute respiratory failure with hypoxia: Secondary | ICD-10-CM | POA: Diagnosis not present

## 2021-07-11 DIAGNOSIS — R000 Tachycardia, unspecified: Secondary | ICD-10-CM | POA: Diagnosis not present

## 2021-07-11 DIAGNOSIS — N186 End stage renal disease: Secondary | ICD-10-CM | POA: Diagnosis not present

## 2021-07-11 DIAGNOSIS — E1065 Type 1 diabetes mellitus with hyperglycemia: Secondary | ICD-10-CM | POA: Diagnosis not present

## 2021-07-11 DIAGNOSIS — I10 Essential (primary) hypertension: Secondary | ICD-10-CM | POA: Diagnosis not present

## 2021-07-11 DIAGNOSIS — R0602 Shortness of breath: Secondary | ICD-10-CM | POA: Diagnosis not present

## 2021-07-11 DIAGNOSIS — R739 Hyperglycemia, unspecified: Secondary | ICD-10-CM | POA: Diagnosis not present

## 2021-07-11 DIAGNOSIS — Z992 Dependence on renal dialysis: Secondary | ICD-10-CM | POA: Diagnosis not present

## 2021-07-11 DIAGNOSIS — I120 Hypertensive chronic kidney disease with stage 5 chronic kidney disease or end stage renal disease: Secondary | ICD-10-CM | POA: Diagnosis not present

## 2021-07-11 DIAGNOSIS — R55 Syncope and collapse: Secondary | ICD-10-CM | POA: Diagnosis not present

## 2021-07-11 DIAGNOSIS — R4020 Unspecified coma: Secondary | ICD-10-CM | POA: Diagnosis not present

## 2021-07-11 DIAGNOSIS — D72829 Elevated white blood cell count, unspecified: Secondary | ICD-10-CM | POA: Diagnosis not present

## 2021-07-11 DIAGNOSIS — E872 Acidosis, unspecified: Secondary | ICD-10-CM | POA: Diagnosis not present

## 2021-07-12 DIAGNOSIS — E1010 Type 1 diabetes mellitus with ketoacidosis without coma: Secondary | ICD-10-CM | POA: Diagnosis not present

## 2021-07-12 DIAGNOSIS — D72829 Elevated white blood cell count, unspecified: Secondary | ICD-10-CM | POA: Diagnosis not present

## 2021-07-12 DIAGNOSIS — Z992 Dependence on renal dialysis: Secondary | ICD-10-CM | POA: Diagnosis not present

## 2021-07-12 DIAGNOSIS — I10 Essential (primary) hypertension: Secondary | ICD-10-CM | POA: Diagnosis not present

## 2021-07-12 DIAGNOSIS — N186 End stage renal disease: Secondary | ICD-10-CM | POA: Diagnosis not present

## 2021-07-12 DIAGNOSIS — E162 Hypoglycemia, unspecified: Secondary | ICD-10-CM | POA: Diagnosis not present

## 2021-07-12 DIAGNOSIS — E872 Acidosis, unspecified: Secondary | ICD-10-CM | POA: Diagnosis not present

## 2021-07-12 DIAGNOSIS — R55 Syncope and collapse: Secondary | ICD-10-CM | POA: Diagnosis not present

## 2021-07-14 DIAGNOSIS — N186 End stage renal disease: Secondary | ICD-10-CM | POA: Diagnosis not present

## 2021-07-14 DIAGNOSIS — G9341 Metabolic encephalopathy: Secondary | ICD-10-CM | POA: Diagnosis not present

## 2021-07-14 DIAGNOSIS — I120 Hypertensive chronic kidney disease with stage 5 chronic kidney disease or end stage renal disease: Secondary | ICD-10-CM | POA: Diagnosis not present

## 2021-07-14 DIAGNOSIS — D72829 Elevated white blood cell count, unspecified: Secondary | ICD-10-CM | POA: Diagnosis not present

## 2021-07-14 DIAGNOSIS — E1010 Type 1 diabetes mellitus with ketoacidosis without coma: Secondary | ICD-10-CM | POA: Diagnosis not present

## 2021-07-14 DIAGNOSIS — E1022 Type 1 diabetes mellitus with diabetic chronic kidney disease: Secondary | ICD-10-CM | POA: Diagnosis not present

## 2021-07-14 DIAGNOSIS — K529 Noninfective gastroenteritis and colitis, unspecified: Secondary | ICD-10-CM | POA: Diagnosis not present

## 2021-07-14 DIAGNOSIS — D631 Anemia in chronic kidney disease: Secondary | ICD-10-CM | POA: Diagnosis not present

## 2021-07-14 DIAGNOSIS — R55 Syncope and collapse: Secondary | ICD-10-CM | POA: Diagnosis not present

## 2021-07-14 DIAGNOSIS — E872 Acidosis, unspecified: Secondary | ICD-10-CM | POA: Diagnosis not present

## 2021-07-14 DIAGNOSIS — E039 Hypothyroidism, unspecified: Secondary | ICD-10-CM | POA: Diagnosis not present

## 2021-07-14 DIAGNOSIS — I10 Essential (primary) hypertension: Secondary | ICD-10-CM | POA: Diagnosis not present

## 2021-07-14 DIAGNOSIS — Z20822 Contact with and (suspected) exposure to covid-19: Secondary | ICD-10-CM | POA: Diagnosis not present

## 2021-07-14 DIAGNOSIS — R112 Nausea with vomiting, unspecified: Secondary | ICD-10-CM | POA: Diagnosis not present

## 2021-07-14 DIAGNOSIS — Z9641 Presence of insulin pump (external) (internal): Secondary | ICD-10-CM | POA: Diagnosis not present

## 2021-07-14 DIAGNOSIS — E871 Hypo-osmolality and hyponatremia: Secondary | ICD-10-CM | POA: Diagnosis not present

## 2021-07-14 DIAGNOSIS — J9601 Acute respiratory failure with hypoxia: Secondary | ICD-10-CM | POA: Diagnosis not present

## 2021-07-14 DIAGNOSIS — E162 Hypoglycemia, unspecified: Secondary | ICD-10-CM | POA: Diagnosis not present

## 2021-07-14 DIAGNOSIS — Z992 Dependence on renal dialysis: Secondary | ICD-10-CM | POA: Diagnosis not present

## 2021-07-14 DIAGNOSIS — E785 Hyperlipidemia, unspecified: Secondary | ICD-10-CM | POA: Diagnosis not present

## 2021-07-14 LAB — WOUND CULTURE

## 2021-07-15 DIAGNOSIS — N186 End stage renal disease: Secondary | ICD-10-CM | POA: Diagnosis not present

## 2021-07-15 DIAGNOSIS — Z992 Dependence on renal dialysis: Secondary | ICD-10-CM | POA: Diagnosis not present

## 2021-07-16 DIAGNOSIS — Z992 Dependence on renal dialysis: Secondary | ICD-10-CM | POA: Diagnosis not present

## 2021-07-16 DIAGNOSIS — N186 End stage renal disease: Secondary | ICD-10-CM | POA: Diagnosis not present

## 2021-07-17 ENCOUNTER — Other Ambulatory Visit: Payer: Self-pay

## 2021-07-17 DIAGNOSIS — N186 End stage renal disease: Secondary | ICD-10-CM | POA: Diagnosis not present

## 2021-07-17 DIAGNOSIS — D509 Iron deficiency anemia, unspecified: Secondary | ICD-10-CM | POA: Diagnosis not present

## 2021-07-17 DIAGNOSIS — Z794 Long term (current) use of insulin: Secondary | ICD-10-CM | POA: Diagnosis not present

## 2021-07-17 DIAGNOSIS — E109 Type 1 diabetes mellitus without complications: Secondary | ICD-10-CM | POA: Diagnosis not present

## 2021-07-17 DIAGNOSIS — Z992 Dependence on renal dialysis: Secondary | ICD-10-CM | POA: Diagnosis not present

## 2021-07-18 DIAGNOSIS — N186 End stage renal disease: Secondary | ICD-10-CM | POA: Diagnosis not present

## 2021-07-18 DIAGNOSIS — Z992 Dependence on renal dialysis: Secondary | ICD-10-CM | POA: Diagnosis not present

## 2021-07-19 DIAGNOSIS — N186 End stage renal disease: Secondary | ICD-10-CM | POA: Diagnosis not present

## 2021-07-19 DIAGNOSIS — Z992 Dependence on renal dialysis: Secondary | ICD-10-CM | POA: Diagnosis not present

## 2021-07-20 DIAGNOSIS — Z992 Dependence on renal dialysis: Secondary | ICD-10-CM | POA: Diagnosis not present

## 2021-07-20 DIAGNOSIS — N186 End stage renal disease: Secondary | ICD-10-CM | POA: Diagnosis not present

## 2021-07-21 DIAGNOSIS — Z992 Dependence on renal dialysis: Secondary | ICD-10-CM | POA: Diagnosis not present

## 2021-07-21 DIAGNOSIS — N186 End stage renal disease: Secondary | ICD-10-CM | POA: Diagnosis not present

## 2021-07-22 DIAGNOSIS — Z992 Dependence on renal dialysis: Secondary | ICD-10-CM | POA: Diagnosis not present

## 2021-07-22 DIAGNOSIS — N186 End stage renal disease: Secondary | ICD-10-CM | POA: Diagnosis not present

## 2021-07-23 DIAGNOSIS — N186 End stage renal disease: Secondary | ICD-10-CM | POA: Diagnosis not present

## 2021-07-23 DIAGNOSIS — Z992 Dependence on renal dialysis: Secondary | ICD-10-CM | POA: Diagnosis not present

## 2021-07-24 DIAGNOSIS — Z992 Dependence on renal dialysis: Secondary | ICD-10-CM | POA: Diagnosis not present

## 2021-07-24 DIAGNOSIS — N186 End stage renal disease: Secondary | ICD-10-CM | POA: Diagnosis not present

## 2021-07-25 DIAGNOSIS — Z992 Dependence on renal dialysis: Secondary | ICD-10-CM | POA: Diagnosis not present

## 2021-07-25 DIAGNOSIS — N186 End stage renal disease: Secondary | ICD-10-CM | POA: Diagnosis not present

## 2021-07-26 DIAGNOSIS — Z992 Dependence on renal dialysis: Secondary | ICD-10-CM | POA: Diagnosis not present

## 2021-07-26 DIAGNOSIS — N186 End stage renal disease: Secondary | ICD-10-CM | POA: Diagnosis not present

## 2021-07-27 DIAGNOSIS — Z992 Dependence on renal dialysis: Secondary | ICD-10-CM | POA: Diagnosis not present

## 2021-07-27 DIAGNOSIS — N186 End stage renal disease: Secondary | ICD-10-CM | POA: Diagnosis not present

## 2021-07-28 DIAGNOSIS — Z992 Dependence on renal dialysis: Secondary | ICD-10-CM | POA: Diagnosis not present

## 2021-07-28 DIAGNOSIS — N186 End stage renal disease: Secondary | ICD-10-CM | POA: Diagnosis not present

## 2021-07-29 ENCOUNTER — Other Ambulatory Visit (HOSPITAL_COMMUNITY): Payer: Self-pay

## 2021-07-29 DIAGNOSIS — N186 End stage renal disease: Secondary | ICD-10-CM | POA: Diagnosis not present

## 2021-07-29 DIAGNOSIS — Z992 Dependence on renal dialysis: Secondary | ICD-10-CM | POA: Diagnosis not present

## 2021-07-29 MED ORDER — CINACALCET HCL 60 MG PO TABS
ORAL_TABLET | ORAL | 12 refills | Status: DC
  Filled 2021-07-29: qty 60, 30d supply, fill #0

## 2021-07-30 ENCOUNTER — Other Ambulatory Visit (HOSPITAL_COMMUNITY): Payer: Self-pay

## 2021-07-30 DIAGNOSIS — N186 End stage renal disease: Secondary | ICD-10-CM | POA: Diagnosis not present

## 2021-07-30 DIAGNOSIS — Z992 Dependence on renal dialysis: Secondary | ICD-10-CM | POA: Diagnosis not present

## 2021-07-31 ENCOUNTER — Ambulatory Visit: Payer: 59 | Admitting: Podiatry

## 2021-07-31 ENCOUNTER — Encounter: Payer: Self-pay | Admitting: Podiatry

## 2021-07-31 ENCOUNTER — Other Ambulatory Visit: Payer: Self-pay

## 2021-07-31 VITALS — Temp 98.0°F

## 2021-07-31 DIAGNOSIS — N186 End stage renal disease: Secondary | ICD-10-CM | POA: Diagnosis not present

## 2021-07-31 DIAGNOSIS — E08621 Diabetes mellitus due to underlying condition with foot ulcer: Secondary | ICD-10-CM | POA: Diagnosis not present

## 2021-07-31 DIAGNOSIS — L97522 Non-pressure chronic ulcer of other part of left foot with fat layer exposed: Secondary | ICD-10-CM

## 2021-07-31 DIAGNOSIS — Z992 Dependence on renal dialysis: Secondary | ICD-10-CM | POA: Diagnosis not present

## 2021-07-31 NOTE — Progress Notes (Signed)
   Subjective:  Patient presents today for follow-up evaluation of an ulcer to the left hallux.  Patient states that there has been some improvement.  He has been doing an increased amount of work lately.  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  No new complaints at this time  Past Medical History:  Diagnosis Date   ESRD (end stage renal disease) ([place])    Gastroparesis    Herniated cervical disc    HTN (hypertension)    Hypercholesteremia    Morbid obesity ([place])    Peritoneal dialysis status ([place])    PONV (postoperative nausea and vomiting)    after peritoneal dialysis catheter was placed   S/P cardiac cath    a. 10-15 yrs ago at [facility] due to tachycardia, reportedly normal. b. Normal ETT 03/2012.   Sinus tachycardia    a. 24-hr Holter 03/2012 - SR, occ PVCs, no VT, avg HR 96bpm.   TIA (transient ischemic attack)    Type 1 diabetes mellitus ([place])    a. With insulin pump.     Objective/Physical Exam Neurovascular status intact. Maceration noted to the incision site has improved significantly.   Wound #1 noted to the left hallux measuring 1.0x1.0 x 0.2 cm.   To the above-noted ulceration, there is no eschar. There is a moderate amount of slough, fibrin and necrotic tissue. Granulation tissue and wound base is red. There is no malodor.  Today there is no evidence of infection there is no erythema or edema around the ulcer site which is good.  Periwound is slightly macerated.  There is no exposed bone muscle tendon ligament or joint.   Radiographic exam LT foot 07/07/2021: Normal osseous mineralization.  No degenerative changes noted that would be concerning for osteomyelitis.  Cortices intact.  History of planing of the plantar aspect of the hallux phalanges.  Assessment: 1. s/p exostectomy left hallux. DOS: 11/16/2018 2. 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 Patient was evaluated. 2.   Medically necessary excisional debridement including subcutaneous tissue was performed using a tissue nipper.  Excisional debridement of all necrotic nonviable tissue down to the healthy bleeding viable tissue was performed with post debridement measurement same as pre- 3.   Continue wearing the cam boot or diabetic shoes with insoles and offload to the great toe 4.  Appointment today with Pedorthist for new custom molded orthotics and diabetic shoes 5. Prisma collagen dressing was provided for the patient to begin applying daily 6.  Return to clinic in 3 weeks  *Spouse's name is Toniann Ket, DPM [facility]  Dr. Edrick Kins, DPM    [address], [place] [address]                Office [phone]  Fax [phone]

## 2021-08-01 DIAGNOSIS — Z992 Dependence on renal dialysis: Secondary | ICD-10-CM | POA: Diagnosis not present

## 2021-08-01 DIAGNOSIS — N186 End stage renal disease: Secondary | ICD-10-CM | POA: Diagnosis not present

## 2021-08-02 DIAGNOSIS — N186 End stage renal disease: Secondary | ICD-10-CM | POA: Diagnosis not present

## 2021-08-02 DIAGNOSIS — Z992 Dependence on renal dialysis: Secondary | ICD-10-CM | POA: Diagnosis not present

## 2021-08-03 ENCOUNTER — Ambulatory Visit: Payer: 59 | Admitting: Endocrinology

## 2021-08-03 DIAGNOSIS — Z992 Dependence on renal dialysis: Secondary | ICD-10-CM | POA: Diagnosis not present

## 2021-08-03 DIAGNOSIS — N186 End stage renal disease: Secondary | ICD-10-CM | POA: Diagnosis not present

## 2021-08-04 DIAGNOSIS — Z992 Dependence on renal dialysis: Secondary | ICD-10-CM | POA: Diagnosis not present

## 2021-08-04 DIAGNOSIS — N186 End stage renal disease: Secondary | ICD-10-CM | POA: Diagnosis not present

## 2021-08-05 DIAGNOSIS — N186 End stage renal disease: Secondary | ICD-10-CM | POA: Diagnosis not present

## 2021-08-05 DIAGNOSIS — Z992 Dependence on renal dialysis: Secondary | ICD-10-CM | POA: Diagnosis not present

## 2021-08-06 DIAGNOSIS — N186 End stage renal disease: Secondary | ICD-10-CM | POA: Diagnosis not present

## 2021-08-06 DIAGNOSIS — Z992 Dependence on renal dialysis: Secondary | ICD-10-CM | POA: Diagnosis not present

## 2021-08-07 DIAGNOSIS — E113513 Type 2 diabetes mellitus with proliferative diabetic retinopathy with macular edema, bilateral: Secondary | ICD-10-CM | POA: Diagnosis not present

## 2021-08-07 DIAGNOSIS — Z992 Dependence on renal dialysis: Secondary | ICD-10-CM | POA: Diagnosis not present

## 2021-08-07 DIAGNOSIS — H35362 Drusen (degenerative) of macula, left eye: Secondary | ICD-10-CM | POA: Diagnosis not present

## 2021-08-07 DIAGNOSIS — H2513 Age-related nuclear cataract, bilateral: Secondary | ICD-10-CM | POA: Diagnosis not present

## 2021-08-07 DIAGNOSIS — H43822 Vitreomacular adhesion, left eye: Secondary | ICD-10-CM | POA: Diagnosis not present

## 2021-08-07 DIAGNOSIS — N186 End stage renal disease: Secondary | ICD-10-CM | POA: Diagnosis not present

## 2021-08-08 DIAGNOSIS — Z992 Dependence on renal dialysis: Secondary | ICD-10-CM | POA: Diagnosis not present

## 2021-08-08 DIAGNOSIS — N186 End stage renal disease: Secondary | ICD-10-CM | POA: Diagnosis not present

## 2021-08-09 DIAGNOSIS — Z992 Dependence on renal dialysis: Secondary | ICD-10-CM | POA: Diagnosis not present

## 2021-08-09 DIAGNOSIS — N186 End stage renal disease: Secondary | ICD-10-CM | POA: Diagnosis not present

## 2021-08-10 DIAGNOSIS — N186 End stage renal disease: Secondary | ICD-10-CM | POA: Diagnosis not present

## 2021-08-10 DIAGNOSIS — Z992 Dependence on renal dialysis: Secondary | ICD-10-CM | POA: Diagnosis not present

## 2021-08-11 DIAGNOSIS — Z992 Dependence on renal dialysis: Secondary | ICD-10-CM | POA: Diagnosis not present

## 2021-08-11 DIAGNOSIS — N186 End stage renal disease: Secondary | ICD-10-CM | POA: Diagnosis not present

## 2021-08-12 DIAGNOSIS — N186 End stage renal disease: Secondary | ICD-10-CM | POA: Diagnosis not present

## 2021-08-12 DIAGNOSIS — Z992 Dependence on renal dialysis: Secondary | ICD-10-CM | POA: Diagnosis not present

## 2021-08-12 IMAGING — DX DG CHEST 1V PORT
1 series · 1 of 1 positions shown · non-contrast
Comparison: 01/05/2021

CLINICAL DATA: Hypoxia.  Found unresponsive and hypoglycemic.

EXAM:
PORTABLE CHEST 1 VIEW

[chest ap]
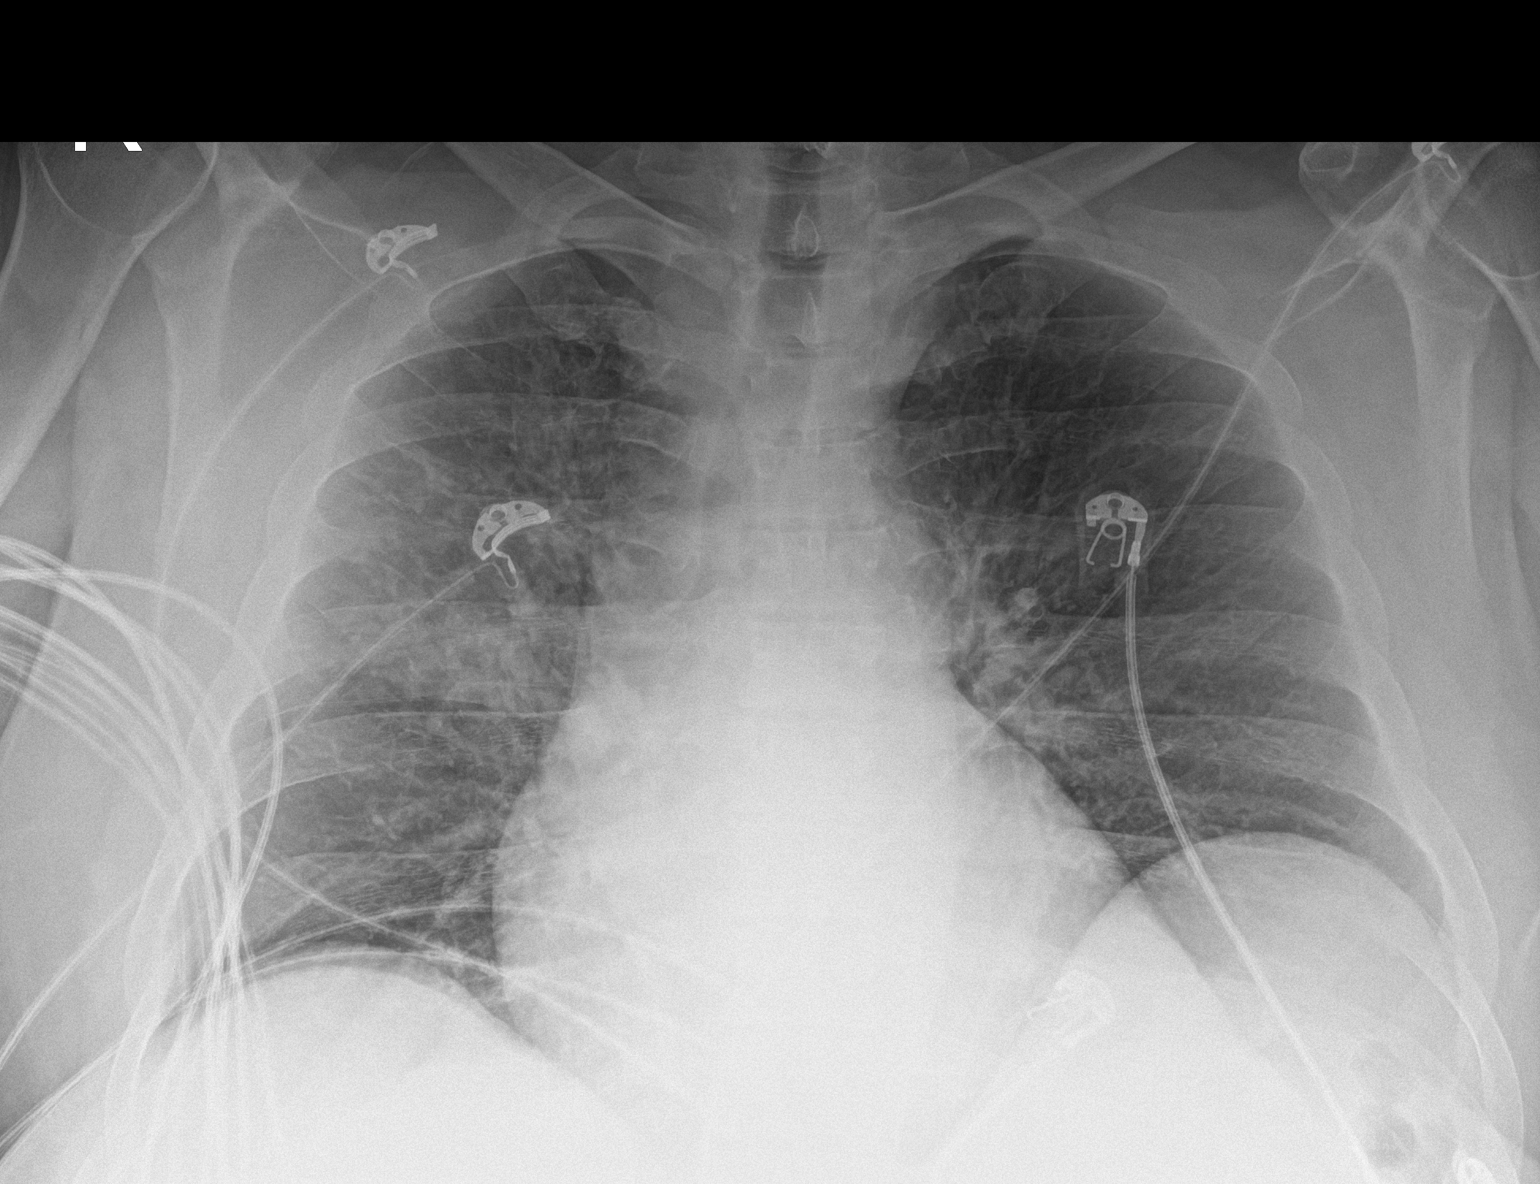

[1 of 1 positions shown; findings below may reference images not displayed]

FINDINGS: Hazy appearance of the bilateral chest with interstitial coarsening.
The right chest is more affected than the left but findings are
bilateral. Borderline heart size. Negative aortic and hilar
contours. No visible effusion or pneumothorax.
IMPRESSION: Asymmetric edema versus right-sided infiltrate (which could be
aspiration related).

## 2021-08-13 DIAGNOSIS — Z992 Dependence on renal dialysis: Secondary | ICD-10-CM | POA: Diagnosis not present

## 2021-08-13 DIAGNOSIS — N186 End stage renal disease: Secondary | ICD-10-CM | POA: Diagnosis not present

## 2021-08-14 DIAGNOSIS — Z992 Dependence on renal dialysis: Secondary | ICD-10-CM | POA: Diagnosis not present

## 2021-08-14 DIAGNOSIS — N186 End stage renal disease: Secondary | ICD-10-CM | POA: Diagnosis not present

## 2021-08-15 DIAGNOSIS — N186 End stage renal disease: Secondary | ICD-10-CM | POA: Diagnosis not present

## 2021-08-15 DIAGNOSIS — Z992 Dependence on renal dialysis: Secondary | ICD-10-CM | POA: Diagnosis not present

## 2021-08-16 DIAGNOSIS — Z992 Dependence on renal dialysis: Secondary | ICD-10-CM | POA: Diagnosis not present

## 2021-08-16 DIAGNOSIS — N186 End stage renal disease: Secondary | ICD-10-CM | POA: Diagnosis not present

## 2021-08-17 DIAGNOSIS — Z992 Dependence on renal dialysis: Secondary | ICD-10-CM | POA: Diagnosis not present

## 2021-08-17 DIAGNOSIS — N186 End stage renal disease: Secondary | ICD-10-CM | POA: Diagnosis not present

## 2021-08-18 DIAGNOSIS — N186 End stage renal disease: Secondary | ICD-10-CM | POA: Diagnosis not present

## 2021-08-18 DIAGNOSIS — Z992 Dependence on renal dialysis: Secondary | ICD-10-CM | POA: Diagnosis not present

## 2021-08-19 ENCOUNTER — Telehealth: Payer: Self-pay | Admitting: Podiatry

## 2021-08-19 DIAGNOSIS — Z992 Dependence on renal dialysis: Secondary | ICD-10-CM | POA: Diagnosis not present

## 2021-08-19 DIAGNOSIS — N186 End stage renal disease: Secondary | ICD-10-CM | POA: Diagnosis not present

## 2021-08-19 NOTE — Telephone Encounter (Signed)
"  I'm supposed to see him on Friday.  I have a really bad foot ulcer.  It's gotten really bad in the past 24 hours.  It's green.  I need him to look at it.  He told me anytime I have an issue like this to not put it off and to call his office and he would work me in.  Give me a call."

## 2021-08-19 NOTE — Telephone Encounter (Signed)
Pt dont want to see any one but you his toe is bleeding and he wanted to know if he can come in to see you

## 2021-08-20 DIAGNOSIS — Z992 Dependence on renal dialysis: Secondary | ICD-10-CM | POA: Diagnosis not present

## 2021-08-20 DIAGNOSIS — N186 End stage renal disease: Secondary | ICD-10-CM | POA: Diagnosis not present

## 2021-08-21 ENCOUNTER — Encounter: Payer: Self-pay | Admitting: Podiatry

## 2021-08-21 ENCOUNTER — Other Ambulatory Visit: Payer: Self-pay | Admitting: Podiatry

## 2021-08-21 ENCOUNTER — Ambulatory Visit (INDEPENDENT_AMBULATORY_CARE_PROVIDER_SITE_OTHER): Payer: 59

## 2021-08-21 ENCOUNTER — Other Ambulatory Visit: Payer: Self-pay

## 2021-08-21 ENCOUNTER — Ambulatory Visit: Payer: 59 | Admitting: Podiatry

## 2021-08-21 VITALS — Temp 98.3°F

## 2021-08-21 DIAGNOSIS — L97522 Non-pressure chronic ulcer of other part of left foot with fat layer exposed: Secondary | ICD-10-CM

## 2021-08-21 DIAGNOSIS — Z992 Dependence on renal dialysis: Secondary | ICD-10-CM | POA: Diagnosis not present

## 2021-08-21 DIAGNOSIS — N186 End stage renal disease: Secondary | ICD-10-CM | POA: Diagnosis not present

## 2021-08-21 DIAGNOSIS — E08621 Diabetes mellitus due to underlying condition with foot ulcer: Secondary | ICD-10-CM

## 2021-08-21 MED ORDER — AMOXICILLIN-POT CLAVULANATE 875-125 MG PO TABS
1.0000 | ORAL_TABLET | Freq: Two times a day (BID) | ORAL | 0 refills | Status: DC
  Filled 2021-08-21: qty 20, 10d supply, fill #0

## 2021-08-22 DIAGNOSIS — N186 End stage renal disease: Secondary | ICD-10-CM | POA: Diagnosis not present

## 2021-08-22 DIAGNOSIS — Z992 Dependence on renal dialysis: Secondary | ICD-10-CM | POA: Diagnosis not present

## 2021-08-23 DIAGNOSIS — Z992 Dependence on renal dialysis: Secondary | ICD-10-CM | POA: Diagnosis not present

## 2021-08-23 DIAGNOSIS — N186 End stage renal disease: Secondary | ICD-10-CM | POA: Diagnosis not present

## 2021-08-24 DIAGNOSIS — N186 End stage renal disease: Secondary | ICD-10-CM | POA: Diagnosis not present

## 2021-08-24 DIAGNOSIS — Z992 Dependence on renal dialysis: Secondary | ICD-10-CM | POA: Diagnosis not present

## 2021-08-25 DIAGNOSIS — Z992 Dependence on renal dialysis: Secondary | ICD-10-CM | POA: Diagnosis not present

## 2021-08-25 DIAGNOSIS — N186 End stage renal disease: Secondary | ICD-10-CM | POA: Diagnosis not present

## 2021-08-26 DIAGNOSIS — Z992 Dependence on renal dialysis: Secondary | ICD-10-CM | POA: Diagnosis not present

## 2021-08-26 DIAGNOSIS — N186 End stage renal disease: Secondary | ICD-10-CM | POA: Diagnosis not present

## 2021-08-27 DIAGNOSIS — Z992 Dependence on renal dialysis: Secondary | ICD-10-CM | POA: Diagnosis not present

## 2021-08-27 DIAGNOSIS — N186 End stage renal disease: Secondary | ICD-10-CM | POA: Diagnosis not present

## 2021-08-27 LAB — WOUND CULTURE: Organism ID, Bacteria: NONE SEEN

## 2021-08-28 DIAGNOSIS — N186 End stage renal disease: Secondary | ICD-10-CM | POA: Diagnosis not present

## 2021-08-28 DIAGNOSIS — Z992 Dependence on renal dialysis: Secondary | ICD-10-CM | POA: Diagnosis not present

## 2021-08-29 DIAGNOSIS — N186 End stage renal disease: Secondary | ICD-10-CM | POA: Diagnosis not present

## 2021-08-29 DIAGNOSIS — Z992 Dependence on renal dialysis: Secondary | ICD-10-CM | POA: Diagnosis not present

## 2021-08-30 DIAGNOSIS — Z992 Dependence on renal dialysis: Secondary | ICD-10-CM | POA: Diagnosis not present

## 2021-08-30 DIAGNOSIS — N186 End stage renal disease: Secondary | ICD-10-CM | POA: Diagnosis not present

## 2021-08-31 DIAGNOSIS — Z992 Dependence on renal dialysis: Secondary | ICD-10-CM | POA: Diagnosis not present

## 2021-08-31 DIAGNOSIS — N186 End stage renal disease: Secondary | ICD-10-CM | POA: Diagnosis not present

## 2021-09-01 ENCOUNTER — Other Ambulatory Visit (HOSPITAL_COMMUNITY): Payer: Self-pay

## 2021-09-01 DIAGNOSIS — N186 End stage renal disease: Secondary | ICD-10-CM | POA: Diagnosis not present

## 2021-09-01 DIAGNOSIS — Z992 Dependence on renal dialysis: Secondary | ICD-10-CM | POA: Diagnosis not present

## 2021-09-01 MED ORDER — AURYXIA 1 GM 210 MG(FE) PO TABS
ORAL_TABLET | ORAL | 12 refills | Status: DC
  Filled 2021-09-01: qty 270, 90d supply, fill #0

## 2021-09-01 NOTE — Progress Notes (Signed)
   Subjective:  Patient presents today for follow-up evaluation of an ulcer to the left hallux.  Patient states that there has been some improvement.  He has been doing an increased amount of work lately.  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  No new complaints at this time  Past Medical History:  Diagnosis Date   ESRD (end stage renal disease) ([place])    Gastroparesis    Herniated cervical disc    HTN (hypertension)    Hypercholesteremia    Morbid obesity ([place])    Peritoneal dialysis status ([place])    PONV (postoperative nausea and vomiting)    after peritoneal dialysis catheter was placed   S/P cardiac cath    a. 10-15 yrs ago at [facility] due to tachycardia, reportedly normal. b. Normal ETT 03/2012.   Sinus tachycardia    a. 24-hr Holter 03/2012 - SR, occ PVCs, no VT, avg HR 96bpm.   TIA (transient ischemic attack)    Type 1 diabetes mellitus ([place])    a. With insulin pump.     Objective/Physical Exam Neurovascular status intact. Maceration noted to the incision site has improved significantly.   Wound #1 noted to the left hallux measuring 1.0x1.0 x 0.2 cm.   To the above-noted ulceration, there is no eschar. There is a moderate amount of slough, fibrin and necrotic tissue. Granulation tissue and wound base is red. There is no malodor.  Today there is no evidence of infection there is no erythema or edema around the ulcer site which is good.  Periwound is slightly macerated.  There is no exposed bone muscle tendon ligament or joint.   Radiographic exam LT foot 07/07/2021: Normal osseous mineralization.  No degenerative changes noted that would be concerning for osteomyelitis.  Cortices intact.  History of planing of the plantar aspect of the hallux phalanges.  Assessment: 1. s/p exostectomy left hallux. DOS: 11/16/2018 2. 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 Patient was evaluated. 2.   Medically necessary excisional debridement including subcutaneous tissue was performed using a tissue nipper.  Excisional debridement of all necrotic nonviable tissue down to the healthy bleeding viable tissue was performed with post debridement measurement same as pre- 3.   Continue wearing the cam boot or diabetic shoes with insoles and offload to the great toe 4.  Appt with pedorthist for DM shoes and insoles pending 5. Continue the Prisma collagen dressing that was provided for the patient to begin applying daily 6.  Culture taken and sent to pathology for C&S 7. Prescription for Augmentin 875/125mg  8. Return to clinic in 3 weeks  *Spouse's name is Toniann Ket, DPM [facility]  Dr. Edrick Kins, DPM    [address], [place] [address]                Office [phone]  Fax [phone]

## 2021-09-02 ENCOUNTER — Other Ambulatory Visit: Payer: Self-pay | Admitting: Endocrinology

## 2021-09-02 ENCOUNTER — Other Ambulatory Visit (HOSPITAL_COMMUNITY): Payer: Self-pay

## 2021-09-02 DIAGNOSIS — Z992 Dependence on renal dialysis: Secondary | ICD-10-CM | POA: Diagnosis not present

## 2021-09-02 DIAGNOSIS — E1065 Type 1 diabetes mellitus with hyperglycemia: Secondary | ICD-10-CM

## 2021-09-02 DIAGNOSIS — N186 End stage renal disease: Secondary | ICD-10-CM | POA: Diagnosis not present

## 2021-09-02 MED FILL — Atorvastatin Calcium Tab 80 MG (Base Equivalent): ORAL | 90 days supply | Qty: 90 | Fill #1 | Status: AC

## 2021-09-03 ENCOUNTER — Other Ambulatory Visit (HOSPITAL_COMMUNITY): Payer: Self-pay

## 2021-09-03 DIAGNOSIS — Z992 Dependence on renal dialysis: Secondary | ICD-10-CM | POA: Diagnosis not present

## 2021-09-03 DIAGNOSIS — N186 End stage renal disease: Secondary | ICD-10-CM | POA: Diagnosis not present

## 2021-09-03 MED ORDER — TRIAMCINOLONE ACETONIDE 0.1 % EX OINT
TOPICAL_OINTMENT | CUTANEOUS | 11 refills | Status: DC
  Filled 2021-09-03: qty 30, 14d supply, fill #0

## 2021-09-03 MED ORDER — TORSEMIDE 100 MG PO TABS
ORAL_TABLET | ORAL | 3 refills | Status: DC
Start: 2021-09-03 — End: 2022-09-20
  Filled 2021-09-03 – 2021-12-30 (×2): qty 90, 90d supply, fill #0
  Filled 2022-03-12: qty 90, 90d supply, fill #1

## 2021-09-03 MED ORDER — GLUCAGON EMERGENCY 1 MG IJ KIT
PACK | INTRAMUSCULAR | 0 refills | Status: DC
  Filled 2021-09-03: qty 2, 30d supply, fill #0

## 2021-09-04 ENCOUNTER — Ambulatory Visit: Payer: 59 | Admitting: Podiatry

## 2021-09-04 ENCOUNTER — Encounter: Payer: Self-pay | Admitting: Podiatry

## 2021-09-04 ENCOUNTER — Other Ambulatory Visit: Payer: Self-pay

## 2021-09-04 ENCOUNTER — Other Ambulatory Visit (HOSPITAL_COMMUNITY): Payer: Self-pay

## 2021-09-04 VITALS — Temp 98.1°F

## 2021-09-04 DIAGNOSIS — E08621 Diabetes mellitus due to underlying condition with foot ulcer: Secondary | ICD-10-CM | POA: Diagnosis not present

## 2021-09-04 DIAGNOSIS — L97522 Non-pressure chronic ulcer of other part of left foot with fat layer exposed: Secondary | ICD-10-CM | POA: Diagnosis not present

## 2021-09-04 DIAGNOSIS — Z992 Dependence on renal dialysis: Secondary | ICD-10-CM | POA: Diagnosis not present

## 2021-09-04 DIAGNOSIS — D509 Iron deficiency anemia, unspecified: Secondary | ICD-10-CM | POA: Diagnosis not present

## 2021-09-04 DIAGNOSIS — N186 End stage renal disease: Secondary | ICD-10-CM | POA: Diagnosis not present

## 2021-09-04 NOTE — Progress Notes (Signed)
Subjective:  Patient presents today for follow-up evaluation of an ulcer to the left hallux.  Patient states that there has been some improvement.  Patient has been doing very well and has been wearing the cam boot as instructed.  No new complaints at this time  Past Medical History:  Diagnosis Date   ESRD (end stage renal disease) ([place])    Gastroparesis    Herniated cervical disc    HTN (hypertension)    Hypercholesteremia    Morbid obesity ([place])    Peritoneal dialysis status ([place])    PONV (postoperative nausea and vomiting)    after peritoneal dialysis catheter was placed   S/P cardiac cath    a. 10-15 yrs ago at [facility] due to tachycardia, reportedly normal. b. Normal ETT 03/2012.   Sinus tachycardia    a. 24-hr Holter 03/2012 - SR, occ PVCs, no VT, avg HR 96bpm.   TIA (transient ischemic attack)    Type 1 diabetes mellitus ([place])    a. With insulin pump.     Objective/Physical Exam Neurovascular status intact. Maceration noted to the incision site has improved significantly.   Wound #1 noted to the left hallux measuring 0.5 x 0.4 x 0.1 cm.   Today there is significant improvement of the wound.  Granulation tissue.  Wound base is red.  No active bleeding.  No serous drainage.  Its very dry and stable and superficial.  Periwound is slightly callused.  Radiographic exam LT foot 07/07/2021: Normal osseous mineralization.  No degenerative changes noted that would be concerning for osteomyelitis.  Cortices intact.  History of planing of the plantar aspect of the hallux phalanges.  Assessment: 1. s/p exostectomy left hallux. DOS: 11/16/2018 2. 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 Patient was evaluated.  Today there is significant improvement of the wound.  The wound has decreased in size significantly and there is no drainage.  It is very superficial and stable.  Great healing over the past few weeks since his last visit on 08/21/2021 2.   Medically necessary excisional debridement including subcutaneous tissue was performed using a tissue nipper.  Excisional debridement of all necrotic nonviable tissue down to the healthy bleeding viable tissue was performed with post debridement measurement same as pre- 3.   Continue wearing the cam boot or diabetic shoes with insoles and offload to the great toe 4.  Appt with pedorthist for DM shoes and insoles pending 5. Continue the Prisma collagen dressing that was provided for the patient to begin applying daily.  Patient also states that he has been applying CBD oil to the wound.  Since there has been some improvement over the last few weeks he may continue his normal regimen 6.  Patient completed his oral antibiotics as prescribed Augmentin 875/125mg  7.  Return to clinic in 2-3 weeks  *Spouse's name is Lattie Haw.  Going to [facility] to be evaluated for kidney transplant recipient list in 1 week  Edrick Kins, DPM [facility]  Dr. Edrick Kins, DPM    [address], [place] [address]                Office [phone]  Fax [phone]

## 2021-09-05 DIAGNOSIS — N186 End stage renal disease: Secondary | ICD-10-CM | POA: Diagnosis not present

## 2021-09-05 DIAGNOSIS — Z992 Dependence on renal dialysis: Secondary | ICD-10-CM | POA: Diagnosis not present

## 2021-09-06 DIAGNOSIS — Z992 Dependence on renal dialysis: Secondary | ICD-10-CM | POA: Diagnosis not present

## 2021-09-06 DIAGNOSIS — N186 End stage renal disease: Secondary | ICD-10-CM | POA: Diagnosis not present

## 2021-09-07 DIAGNOSIS — N186 End stage renal disease: Secondary | ICD-10-CM | POA: Diagnosis not present

## 2021-09-07 DIAGNOSIS — Z992 Dependence on renal dialysis: Secondary | ICD-10-CM | POA: Diagnosis not present

## 2021-09-08 DIAGNOSIS — Z992 Dependence on renal dialysis: Secondary | ICD-10-CM | POA: Diagnosis not present

## 2021-09-08 DIAGNOSIS — N186 End stage renal disease: Secondary | ICD-10-CM | POA: Diagnosis not present

## 2021-09-09 DIAGNOSIS — Z01818 Encounter for other preprocedural examination: Secondary | ICD-10-CM | POA: Diagnosis not present

## 2021-09-09 DIAGNOSIS — Z01810 Encounter for preprocedural cardiovascular examination: Secondary | ICD-10-CM | POA: Diagnosis not present

## 2021-09-09 DIAGNOSIS — Z992 Dependence on renal dialysis: Secondary | ICD-10-CM | POA: Diagnosis not present

## 2021-09-09 DIAGNOSIS — N186 End stage renal disease: Secondary | ICD-10-CM | POA: Diagnosis not present

## 2021-09-10 ENCOUNTER — Other Ambulatory Visit (HOSPITAL_COMMUNITY): Payer: Self-pay

## 2021-09-10 DIAGNOSIS — N186 End stage renal disease: Secondary | ICD-10-CM | POA: Diagnosis not present

## 2021-09-10 DIAGNOSIS — Z992 Dependence on renal dialysis: Secondary | ICD-10-CM | POA: Diagnosis not present

## 2021-09-11 DIAGNOSIS — Z992 Dependence on renal dialysis: Secondary | ICD-10-CM | POA: Diagnosis not present

## 2021-09-11 DIAGNOSIS — N186 End stage renal disease: Secondary | ICD-10-CM | POA: Diagnosis not present

## 2021-09-12 DIAGNOSIS — N186 End stage renal disease: Secondary | ICD-10-CM | POA: Diagnosis not present

## 2021-09-12 DIAGNOSIS — Z992 Dependence on renal dialysis: Secondary | ICD-10-CM | POA: Diagnosis not present

## 2021-09-13 DIAGNOSIS — Z992 Dependence on renal dialysis: Secondary | ICD-10-CM | POA: Diagnosis not present

## 2021-09-13 DIAGNOSIS — N186 End stage renal disease: Secondary | ICD-10-CM | POA: Diagnosis not present

## 2021-09-14 DIAGNOSIS — Z992 Dependence on renal dialysis: Secondary | ICD-10-CM | POA: Diagnosis not present

## 2021-09-14 DIAGNOSIS — N186 End stage renal disease: Secondary | ICD-10-CM | POA: Diagnosis not present

## 2021-09-15 DIAGNOSIS — N186 End stage renal disease: Secondary | ICD-10-CM | POA: Diagnosis not present

## 2021-09-15 DIAGNOSIS — Z992 Dependence on renal dialysis: Secondary | ICD-10-CM | POA: Diagnosis not present

## 2021-09-16 ENCOUNTER — Other Ambulatory Visit: Payer: 59

## 2021-09-16 ENCOUNTER — Other Ambulatory Visit: Payer: Self-pay

## 2021-09-16 DIAGNOSIS — L97522 Non-pressure chronic ulcer of other part of left foot with fat layer exposed: Secondary | ICD-10-CM

## 2021-09-16 DIAGNOSIS — Z992 Dependence on renal dialysis: Secondary | ICD-10-CM | POA: Diagnosis not present

## 2021-09-16 DIAGNOSIS — E08621 Diabetes mellitus due to underlying condition with foot ulcer: Secondary | ICD-10-CM

## 2021-09-16 DIAGNOSIS — N186 End stage renal disease: Secondary | ICD-10-CM | POA: Diagnosis not present

## 2021-09-17 DIAGNOSIS — Z992 Dependence on renal dialysis: Secondary | ICD-10-CM | POA: Diagnosis not present

## 2021-09-17 DIAGNOSIS — N186 End stage renal disease: Secondary | ICD-10-CM | POA: Diagnosis not present

## 2021-09-18 DIAGNOSIS — N186 End stage renal disease: Secondary | ICD-10-CM | POA: Diagnosis not present

## 2021-09-18 DIAGNOSIS — Z992 Dependence on renal dialysis: Secondary | ICD-10-CM | POA: Diagnosis not present

## 2021-09-19 DIAGNOSIS — Z992 Dependence on renal dialysis: Secondary | ICD-10-CM | POA: Diagnosis not present

## 2021-09-19 DIAGNOSIS — N186 End stage renal disease: Secondary | ICD-10-CM | POA: Diagnosis not present

## 2021-09-20 DIAGNOSIS — N186 End stage renal disease: Secondary | ICD-10-CM | POA: Diagnosis not present

## 2021-09-20 DIAGNOSIS — Z992 Dependence on renal dialysis: Secondary | ICD-10-CM | POA: Diagnosis not present

## 2021-09-21 DIAGNOSIS — N186 End stage renal disease: Secondary | ICD-10-CM | POA: Diagnosis not present

## 2021-09-21 DIAGNOSIS — Z992 Dependence on renal dialysis: Secondary | ICD-10-CM | POA: Diagnosis not present

## 2021-09-22 ENCOUNTER — Other Ambulatory Visit (HOSPITAL_COMMUNITY): Payer: Self-pay

## 2021-09-22 DIAGNOSIS — Z992 Dependence on renal dialysis: Secondary | ICD-10-CM | POA: Diagnosis not present

## 2021-09-22 DIAGNOSIS — E113513 Type 2 diabetes mellitus with proliferative diabetic retinopathy with macular edema, bilateral: Secondary | ICD-10-CM | POA: Diagnosis not present

## 2021-09-22 DIAGNOSIS — N186 End stage renal disease: Secondary | ICD-10-CM | POA: Diagnosis not present

## 2021-09-22 MED ORDER — OXYCODONE-ACETAMINOPHEN 5-325 MG PO TABS
1.0000 | ORAL_TABLET | Freq: Four times a day (QID) | ORAL | 0 refills | Status: DC
  Filled 2021-09-22: qty 5, 2d supply, fill #0

## 2021-09-23 DIAGNOSIS — Z992 Dependence on renal dialysis: Secondary | ICD-10-CM | POA: Diagnosis not present

## 2021-09-23 DIAGNOSIS — N186 End stage renal disease: Secondary | ICD-10-CM | POA: Diagnosis not present

## 2021-09-24 ENCOUNTER — Telehealth: Payer: Self-pay | Admitting: Podiatry

## 2021-09-24 DIAGNOSIS — N186 End stage renal disease: Secondary | ICD-10-CM | POA: Diagnosis not present

## 2021-09-24 DIAGNOSIS — Z992 Dependence on renal dialysis: Secondary | ICD-10-CM | POA: Diagnosis not present

## 2021-09-24 NOTE — Telephone Encounter (Signed)
Per [employer] @ [employer] insurance diabetic shoes/inserts(A5500/A5514) are valid and billable and no Josem Kaufmann is required. No limitations. Covered @ 80% after deductible(pt has met).Marland Kitchen ref # [id number]

## 2021-09-25 ENCOUNTER — Ambulatory Visit: Payer: 59 | Admitting: Podiatry

## 2021-09-25 DIAGNOSIS — Z992 Dependence on renal dialysis: Secondary | ICD-10-CM | POA: Diagnosis not present

## 2021-09-25 DIAGNOSIS — N186 End stage renal disease: Secondary | ICD-10-CM | POA: Diagnosis not present

## 2021-09-26 DIAGNOSIS — Z992 Dependence on renal dialysis: Secondary | ICD-10-CM | POA: Diagnosis not present

## 2021-09-26 DIAGNOSIS — N186 End stage renal disease: Secondary | ICD-10-CM | POA: Diagnosis not present

## 2021-09-27 DIAGNOSIS — N186 End stage renal disease: Secondary | ICD-10-CM | POA: Diagnosis not present

## 2021-09-27 DIAGNOSIS — Z992 Dependence on renal dialysis: Secondary | ICD-10-CM | POA: Diagnosis not present

## 2021-09-28 DIAGNOSIS — N186 End stage renal disease: Secondary | ICD-10-CM | POA: Diagnosis not present

## 2021-09-28 DIAGNOSIS — Z992 Dependence on renal dialysis: Secondary | ICD-10-CM | POA: Diagnosis not present

## 2021-09-29 DIAGNOSIS — N186 End stage renal disease: Secondary | ICD-10-CM | POA: Diagnosis not present

## 2021-09-29 DIAGNOSIS — Z992 Dependence on renal dialysis: Secondary | ICD-10-CM | POA: Diagnosis not present

## 2021-09-30 DIAGNOSIS — Z992 Dependence on renal dialysis: Secondary | ICD-10-CM | POA: Diagnosis not present

## 2021-09-30 DIAGNOSIS — N186 End stage renal disease: Secondary | ICD-10-CM | POA: Diagnosis not present

## 2021-10-01 DIAGNOSIS — N186 End stage renal disease: Secondary | ICD-10-CM | POA: Diagnosis not present

## 2021-10-01 DIAGNOSIS — Z992 Dependence on renal dialysis: Secondary | ICD-10-CM | POA: Diagnosis not present

## 2021-10-02 ENCOUNTER — Other Ambulatory Visit: Payer: Self-pay

## 2021-10-02 ENCOUNTER — Ambulatory Visit: Payer: 59 | Admitting: Endocrinology

## 2021-10-02 ENCOUNTER — Other Ambulatory Visit (HOSPITAL_COMMUNITY): Payer: Self-pay

## 2021-10-02 VITALS — BP 164/104 | HR 71 | Ht 71.0 in | Wt 244.8 lb

## 2021-10-02 DIAGNOSIS — E1065 Type 1 diabetes mellitus with hyperglycemia: Secondary | ICD-10-CM | POA: Diagnosis not present

## 2021-10-02 DIAGNOSIS — N186 End stage renal disease: Secondary | ICD-10-CM | POA: Diagnosis not present

## 2021-10-02 DIAGNOSIS — Z992 Dependence on renal dialysis: Secondary | ICD-10-CM | POA: Diagnosis not present

## 2021-10-02 LAB — POCT GLYCOSYLATED HEMOGLOBIN (HGB A1C): Hemoglobin A1C: 9.2 % — AB (ref 4.0–5.6)

## 2021-10-02 MED ORDER — PREGABALIN 75 MG PO CAPS
75.0000 mg | ORAL_CAPSULE | Freq: Every day | ORAL | 2 refills | Status: DC
  Filled 2021-10-02: qty 30, 30d supply, fill #0

## 2021-10-02 NOTE — Progress Notes (Signed)
Patient ID: [name], male   DOB: [birth year], 50 y.o.   MRN: [id number]           Reason for Appointment : for Type 1 Diabetes  History of Present Illness   Referring HCP: Dr. Holley Raring         Diagnosis: Type 1 diabetes mellitus, date of diagnosis:  1997        Previous history:  He has been variously on insulin injections and insulin pumps since his diagnosis He has been on insulin pump on and off for the last few years with fair control  Recent history:   INSULIN pump settings with the MINIMED 670 G pump:  Basal rate #1: 12 AM = 0.3, 3 AM = 0.9, from 6 AM = 1.15, 10 AM = 1.50 and 8 PM = 1.6  Basal rate #2: 12 AM = 1.7, 8 AM = 1.4 and 6 PM = 1.8  Carbohydrate coverage 1: 7 at breakfast and suppertime and 1: 8 at lunch  Sensitivity 1: 30 except 1: 20 between 8 PM-8 AM Current target 120 and active insulin 3 hours  A1c is 9.2  Basal rate at 6 AM = 1.15 and 10 AM = 1.50  Current management, blood sugar patterns and problems identified:   He has not been seen in follow-up since 7/22 Despite his not having his supplies for his guardian sensor he did not call to request a prescription  Blood sugar monitoring has been very sporadic  Also he did not bring his glucose monitor for download Also not using freestyle libre that he had used before also  He says he is having difficulties with his renal problems and peritoneal dialysis and has not been able to focus on his diabetes  Also he again thinks that he will periodically get low sugars between 3-4 AM including severe low sugars and for this reason will sometimes reduce his basal rate 50% around bedtime  However no documented low sugars are available and not clear where his blood sugars are low  Most of his blood sugars at breakfast time are high except the last 2 days  Mealtime boluses have been very sporadic and only occasionally entering carbohydrates in the evening at mealtime  Although he is eating supper around 5:30  PM and evening boluses are between 8-10 PM At that time his blood sugars are mostly over 200   He does not do any formal exercise Has been walking during the day at work up to 4-5 miles usually  GLUCOSE data: FASTING range 137-390 Blood sugars are averaging 189 at time of evening bolus Blood sugar after dinner/bedtime 107-350 with AVERAGE 205 Overall AVERAGE 206+/-96  PREVIOUS Sensor download is interpreted as follows for the last 2 weeks on his guardian  Blood sugars are generally rising progressively between 9-10 AM until at least 10 PM and then variably high overnight Overnight blood sugars are inconsistent with some tendency to rise between 2-4 AM Highest blood sugars on an average are usually around 2-3 PM HYPERGLYCEMIC episodes are occurring around 2 PM, 5 PM and also 8 AM.  Pre and postprandial readings are difficult to judge as he is not entering carbohydrates frequently. Generally blood sugars are variable after meals and some of this may be occurring either at lunch or dinner  CGM use % of time 92  2-week average/GV 175+/-68   %  Time in range     58  % Time Above 180 26  % Time  above 250 15  % Time Below 70 1     PRE-MEAL Fasting Lunch Dinner Bedtime Overall  Glucose range:       Averages: 130   206     Self-care: Typical breakfast will be toast, eggs and bacon Usually lunch will be chicken, pasta and green beans Typical dinner: Steak asparagus and corn Snacks peanut butter crackers and chips   Mealtimes are: Dinner: 6-9 PM                Dietician consultation: Most recent: none.         CDE consultation: Years ago  Diabetes labs:  Lab Results  Component Value Date   HGBA1C 9.2 (A) 10/02/2021   HGBA1C 8.6 (H) 02/10/2021   HGBA1C 8.1 (H) 01/22/2021   Lab Results  Component Value Date   MICROALBUR 93.4 (H) 02/10/2021   LDLCALC 95 02/10/2021   CREATININE 7.74 (HH) 02/10/2021    Lab Results  Component Value Date   MICRALBCREAT 156.1 (H)  02/10/2021     Allergies as of 10/02/2021       Reactions   Sulfa Antibiotics Anaphylaxis, Swelling   Pt is not allergic to iodine, has had iodine in the past w/o premeds and w/ no problems   Oxycodone Nausea And Vomiting   Oxycontin [oxycodone Hcl] Nausea And Vomiting   Penicillins Other (See Comments)   Possible reaction many years ago per patient (Pt reports he has had pcn without issues since time of "reaction") Other reaction(s): Unknown   Prednisone Other (See Comments)   Patient is diabetic, runs sugar up Other reaction(s): Other (See Comments), Other (See Comments) Patient is diabetic, runs sugar up Patient is diabetic, runs sugar up Patient is diabetic, runs sugar up    Shellfish-derived Products Swelling   Eyes swell with shellfish Pt is not allergic to iodine, has had iodine in the past w/o premeds and w/ no problem        Medication List        Accurate as of October 02, 2021  9:33 AM. If you have any questions, ask your nurse or doctor.          acetaminophen 500 MG tablet Commonly known as: TYLENOL Take 500-1,000 mg by mouth every 6 (six) hours as needed for mild pain or fever.   amLODipine 10 MG tablet Commonly known as: NORVASC TAKE 1 TABLET BY MOUTH ONCE DAILY What changed: how much to take   amoxicillin-clavulanate 875-125 MG tablet Commonly known as: AUGMENTIN Take 1 tablet by mouth 2 (two) times daily.   atorvastatin 80 MG tablet Commonly known as: LIPITOR TAKE 1 TABLET BY MOUTH ONCE DAILY What changed: how much to take   Auryxia 1 GM 210 MG(Fe) tablet Generic drug: ferric citrate TAKE 3 TABLETS BY MOUTH WITH MEALS AND 1 TABLET WITH SNACKS. DON'T EXCEED 11 TABLETS DAILY. What changed:  how much to take how to take this when to take this   Auryxia 1 GM 210 MG(Fe) tablet Generic drug: ferric citrate Take 3 tablets by mouth 3 times a day with a meal What changed: Another medication with the same name was changed. Make sure you  understand how and when to take each.   carvedilol 25 MG tablet Commonly known as: COREG take one tablet by mouth 2 times daily with meals   cinacalcet 60 MG tablet Commonly known as: SENSIPAR Take 1 tablet (60 mg total) by mouth daily.   cinacalcet 60 MG tablet Commonly known as:  Sensipar Take 2 tablets by mouth once a day   cloNIDine 0.1 MG tablet Commonly known as: CATAPRES Take 0.1 mg by mouth 2 (two) times daily.   cloNIDine 0.1 MG tablet Commonly known as: CATAPRES take one tablet by mouth 3 times daily   Difluprednate 0.05 % Emul Commonly known as: Durezol Place 1 drop into the right eye 2 (two) times daily. Begin 1 day after surgery and continue as directed.   doxycycline 100 MG tablet Commonly known as: VIBRA-TABS Take 1 tablet (100 mg total) by mouth 2 (two) times daily.   FreeStyle Libre 2 Sensor Misc APPLY EVERY 14 DAYS.   gabapentin 300 MG capsule Commonly known as: NEURONTIN Take 1 capsule (300 mg total) by mouth 2 (two) times daily.   gentamicin cream 0.1 % Commonly known as: GARAMYCIN Apply pea size amount to exit size daily.   Glucagon Emergency 1 MG Kit Inject into the muscle as directed for severe sugar.   HumaLOG 100 UNIT/ML injection Generic drug: insulin lispro Use up to 100 units total via insulin pump daily   losartan 100 MG tablet Commonly known as: COZAAR TAKE 1 TABLET BY MOUTH ONCE DAILY What changed: how much to take   ofloxacin 0.3 % ophthalmic solution Commonly known as: OCUFLOX Place 1 drop into the right eye 4 (four) times daily. Begin 1 day prior to surgery. Continue as directed.   oxyCODONE-acetaminophen 5-325 MG tablet Commonly known as: Percocet Take 1 tablet by mouth every 6 hours   polyethylene glycol 17 g packet Commonly known as: MiraLax Take 17 g by mouth daily.   Suprep Bowel Prep Kit 17.5-3.13-1.6 GM/177ML Soln Generic drug: Na Sulfate-K Sulfate-Mg Sulf USE AS DIRECTED   torsemide 100 MG tablet Commonly  known as: DEMADEX Take 1 tablet (100 mg total) by mouth 2 (two) times daily.   torsemide 100 MG tablet Commonly known as: DEMADEX Take 1 tablet by mouth once a day   triamcinolone ointment 0.1 % Commonly known as: KENALOG APPLY TOPICALLY 2 TIMES A DAY What changed:  how much to take how to take this when to take this   triamcinolone ointment 0.1 % Commonly known as: KENALOG Apply topically 2 times a day What changed: Another medication with the same name was changed. Make sure you understand how and when to take each.        Allergies:  Allergies  Allergen Reactions   Sulfa Antibiotics Anaphylaxis and Swelling    Pt is not allergic to iodine, has had iodine in the past w/o premeds and w/ no problems   Oxycodone Nausea And Vomiting   Oxycontin [Oxycodone Hcl] Nausea And Vomiting   Penicillins Other (See Comments)    Possible reaction many years ago per patient (Pt reports he has had pcn without issues since time of "reaction") Other reaction(s): Unknown   Prednisone Other (See Comments)    Patient is diabetic, runs sugar up Other reaction(s): Other (See Comments), Other (See Comments) Patient is diabetic, runs sugar up Patient is diabetic, runs sugar up Patient is diabetic, runs sugar up    Shellfish-Derived Products Swelling    Eyes swell with shellfish Pt is not allergic to iodine, has had iodine in the past w/o premeds and w/ no problem    Past Medical History:  Diagnosis Date   ESRD (end stage renal disease) ([place])    Gastroparesis    Herniated cervical disc    HTN (hypertension)    Hypercholesteremia    Morbid obesity ([place])  Peritoneal dialysis status ([place])    PONV (postoperative nausea and vomiting)    after peritoneal dialysis catheter was placed   S/P cardiac cath    a. 10-15 yrs ago at [facility] due to tachycardia, reportedly normal. b. Normal ETT 03/2012.   Sinus tachycardia    a. 24-hr Holter 03/2012 - SR, occ PVCs, no VT, avg HR 96bpm.   TIA (transient  ischemic attack)    Type 1 diabetes mellitus ([place])    a. With insulin pump.    Past Surgical History:  Procedure Laterality Date   BIOPSY  10/03/2019   Procedure: BIOPSY;  Surgeon: Mauri Pole, MD;  Location: [facility];  Service: Endoscopy;;   cervical neck fusion     COLONOSCOPY WITH PROPOFOL N/A 01/02/2021   Procedure: COLONOSCOPY WITH PROPOFOL;  Surgeon: [name], MD;  Location: [facility];  Service: Endoscopy;  Laterality: N/A;  priority 4 DIABETIC needs potassium draw   ESOPHAGOGASTRODUODENOSCOPY (EGD) WITH PROPOFOL N/A 10/03/2019   Procedure: ESOPHAGOGASTRODUODENOSCOPY (EGD) WITH PROPOFOL;  Surgeon: Mauri Pole, MD;  Location: [facility];  Service: Endoscopy;  Laterality: N/A;   HERNIA REPAIR     bilateral   LEFT HEART CATHETERIZATION WITH CORONARY ANGIOGRAM N/A 12/18/2013   Procedure: LEFT HEART CATHETERIZATION WITH CORONARY ANGIOGRAM;  Surgeon: [name] M Martinique, MD;  Location: [facility] CATH LAB;  Service: Cardiovascular;  Laterality: N/A;   POLYPECTOMY  01/02/2021   Procedure: POLYPECTOMY;  Surgeon: [name], MD;  Location: [facility];  Service: Endoscopy;;    Family History  Problem Relation Age of Onset   Hypertension Other    Coronary artery disease Other        Mother's side - both her side's grandparents died of heart disease (MIs)   Diabetes Other    Stroke Other        Paternal grandfather (82)   Prostate cancer Neg Hx    Bladder Cancer Neg Hx    Kidney cancer Neg Hx     Social History:  reports that he has never smoked. He has never used smokeless tobacco. He reports that he does not drink alcohol and does not use drugs.      Review of Systems      Lipids: Has been treated with 80 mg atorvastatin, last labs as follows  Lab Results  Component Value Date   CHOL 178 02/10/2021   HDL 53.40 02/10/2021   LDLCALC 95 02/10/2021   TRIG 145.0 02/10/2021   CHOLHDL 3 02/10/2021    Last dilated eye exam was in 36/62  DIABETES  COMPLICATIONS: Nephropathy, diabetic foot ulcers  NEUROPATHY: He says that periodically will have a lot of painful sensations in his legs from neuropathy with tingling and sharp pains.   However gabapentin that was given makes him feel bad and he is not taking this Symptoms are intermittent now  Hypertension: On multiple drugs including clonidine followed by nephrology   Physical Examination:  BP (!) 164/104    Pulse 71    Ht 5' 11"  (1.803 m)    Wt 244 lb 12.8 oz (111 kg)    SpO2 98%    BMI 34.14 kg/m    ASSESSMENT:  Diabetes type 1, poorly controlled  A1c is 9.2  He is using the [employer] 670 pump without the sensor currently  Problems identified: He has poor control as discussed above from inadequate basal and boluses at most times  Most of his mealtime boluses are missed or late  He is afraid  of low sugars overnight and will periodically reduce his basal rate late at night  With this his readings in the mornings are mostly high and over 300 at times  Blood sugar monitoring is also inadequate  Hypertension: Poorly controlled, he will follow-up with his nephrologist   PLAN:   He will need to start using the guardian sensor Also encouraged him to upgrade to the 770 pump  Emphasized the need to bolus at the start of each meal regardless of blood sugar Enter carbohydrates and blood sugars with every meal while bolusing.   May bolus have of the amount only if he is not sure how much he will eat Likely needs to add extra for any higher fat meals Check blood sugar during the night also periodically to help assess his basal rate overnight  In the meantime he will reduce his basal rate at 3 AM down to 0.65 but increase it at midnight up to 0.4 Also decrease basal rate at 10 PM down to 1.3 Continue same carbohydrate ratio  Lyrica 75 mg at bedtime as needed for neuropathy instead of gabapentin  There are no Patient Instructions on file for this visit.   Total visit time  for evaluation and management and counseling = 30 minutes  Draco Malczewski 10/02/2021, 9:33 AM   Total visit time including counseling = 30 minutes   Note: This note was prepared with Dragon voice recognition system technology. Any transcriptional errors that result from this process are unintentional.

## 2021-10-03 DIAGNOSIS — N186 End stage renal disease: Secondary | ICD-10-CM | POA: Diagnosis not present

## 2021-10-03 DIAGNOSIS — Z992 Dependence on renal dialysis: Secondary | ICD-10-CM | POA: Diagnosis not present

## 2021-10-04 DIAGNOSIS — N186 End stage renal disease: Secondary | ICD-10-CM | POA: Diagnosis not present

## 2021-10-04 DIAGNOSIS — Z992 Dependence on renal dialysis: Secondary | ICD-10-CM | POA: Diagnosis not present

## 2021-10-05 DIAGNOSIS — Z992 Dependence on renal dialysis: Secondary | ICD-10-CM | POA: Diagnosis not present

## 2021-10-05 DIAGNOSIS — N186 End stage renal disease: Secondary | ICD-10-CM | POA: Diagnosis not present

## 2021-10-06 ENCOUNTER — Other Ambulatory Visit: Payer: Self-pay

## 2021-10-06 ENCOUNTER — Ambulatory Visit: Payer: 59 | Admitting: Cardiovascular Disease

## 2021-10-06 ENCOUNTER — Encounter: Payer: Self-pay | Admitting: Cardiovascular Disease

## 2021-10-06 VITALS — BP 200/100 | HR 83 | Ht 71.0 in | Wt 245.4 lb

## 2021-10-06 DIAGNOSIS — Z01810 Encounter for preprocedural cardiovascular examination: Secondary | ICD-10-CM

## 2021-10-06 DIAGNOSIS — E785 Hyperlipidemia, unspecified: Secondary | ICD-10-CM

## 2021-10-06 DIAGNOSIS — Z992 Dependence on renal dialysis: Secondary | ICD-10-CM | POA: Diagnosis not present

## 2021-10-06 DIAGNOSIS — I10 Essential (primary) hypertension: Secondary | ICD-10-CM | POA: Diagnosis not present

## 2021-10-06 DIAGNOSIS — N186 End stage renal disease: Secondary | ICD-10-CM | POA: Diagnosis not present

## 2021-10-06 NOTE — H&P (View-Only) (Signed)
Cardiology Office Note   Date:  10/06/2021   ID:  MUZAMIL HARKER, DOB May 19, 1971, MRN [id number]  PCP:  [name], MD  Cardiologist:   Kathlyn Sacramento, MD   Chief Complaint  Patient presents with   Other    Post cardiac cath. Meds reviewed verbally with pt.      History of Present Illness: [name] is a 51 y.o. male who is here today for a follow-up visit regarding preoperative cardiovascular evaluation before kidney transplant.  He has known history of type 1 diabetes, end-stage renal disease on peritoneal dialysis, sleep apnea, herniated cervical disc, obstructive sleep apnea, hypertension and hyperlipidemia.  He had cardiac catheterization in the remote past due to tachycardia which was reportedly normal.   Previous Holter monitor in 2013 showed occasional PVCs and average heart rate of 96 bpm.  He was hospitalized in December 2020 with DKA and elevated troponin to 10,000.  Echo showed normal LV systolic function.  He had no symptoms of acute coronary syndrome at that time.  He underwent an outpatient Lexiscan Myoview which was normal.  He is on the kidney transplant list at [facility].  He did undergo a Lexiscan Myoview in March 2022 which showed no evidence of ischemia with normal ejection fraction.  He is now in the process of getting listed for kidney transplant at [facility].  Cardiac catheterization is required before listing on the transplant list.  He denies chest pain or shortness of breath.  Blood pressure is extremely difficult to control with medications.  Past Medical History:  Diagnosis Date   ESRD (end stage renal disease) ([name])    Gastroparesis    Herniated cervical disc    HTN (hypertension)    Hypercholesteremia    Morbid obesity ([name])    Peritoneal dialysis status (HCC)    PONV (postoperative nausea and vomiting)    after peritoneal dialysis catheter was placed   S/P cardiac cath    a. 10-15 yrs ago at [facility] due to tachycardia, reportedly  normal. b. Normal ETT 03/2012.   Sinus tachycardia    a. 24-hr Holter 03/2012 - SR, occ PVCs, no VT, avg HR 96bpm.   TIA (transient ischemic attack)    Type 1 diabetes mellitus ([name])    a. With insulin pump.    Past Surgical History:  Procedure Laterality Date   BIOPSY  10/03/2019   Procedure: BIOPSY;  Surgeon: Mauri Pole, MD;  Location: [facility];  Service: Endoscopy;;   cervical neck fusion     COLONOSCOPY WITH PROPOFOL N/A 01/02/2021   Procedure: COLONOSCOPY WITH PROPOFOL;  Surgeon: [name], MD;  Location: [place];  Service: Endoscopy;  Laterality: N/A;  priority 4 DIABETIC needs potassium draw   ESOPHAGOGASTRODUODENOSCOPY (EGD) WITH PROPOFOL N/A 10/03/2019   Procedure: ESOPHAGOGASTRODUODENOSCOPY (EGD) WITH PROPOFOL;  Surgeon: Mauri Pole, MD;  [facility];  Service: Endoscopy;  Laterality: N/A;   HERNIA REPAIR     bilateral   LEFT HEART CATHETERIZATION WITH CORONARY ANGIOGRAM N/A 12/18/2013   Procedure: LEFT HEART CATHETERIZATION WITH CORONARY ANGIOGRAM;  Surgeon: [name] M Martinique, MD;  Location: [facility];  Service: Cardiovascular;  Laterality: N/A;   POLYPECTOMY  01/02/2021   Procedure: POLYPECTOMY;  Surgeon: [name], MD;  Location: [facility];  Service: Endoscopy;;     Current Outpatient Medications  Medication Sig Dispense Refill   acetaminophen (TYLENOL) 500 MG tablet Take 500-1,000 mg by mouth every 6 (six) hours as  needed for mild pain or fever.     amLODipine (NORVASC) 10 MG tablet TAKE 1 TABLET BY MOUTH ONCE DAILY (Patient taking differently: Take 10 mg by mouth daily.) 30 tablet 11   atorvastatin (LIPITOR) 80 MG tablet TAKE 1 TABLET BY MOUTH ONCE DAILY (Patient taking differently: Take 80 mg by mouth daily.) 30 tablet 11   carvedilol (COREG) 25 MG tablet take one tablet by mouth 2 times daily with meals 360 tablet 0   cinacalcet (SENSIPAR) 60 MG tablet Take 1 tablet (60 mg total) by mouth daily. 90 tablet 3    cloNIDine (CATAPRES) 0.1 MG tablet Take 0.1 mg by mouth 2 (two) times daily.     ferric citrate (AURYXIA) 1 GM 210 MG(Fe) tablet Take 3 tablets by mouth 3 times a day with a meal 270 tablet 12   gentamicin cream (GARAMYCIN) 0.1 % Apply pea size amount to exit size daily. 30 g 12   Glucagon, rDNA, (GLUCAGON EMERGENCY) 1 MG KIT Inject into the muscle as directed for severe sugar. 2 kit 0   insulin lispro (HUMALOG) 100 UNIT/ML injection Use up to 100 units total via insulin pump daily 90 mL 0   losartan (COZAAR) 100 MG tablet TAKE 1 TABLET BY MOUTH ONCE DAILY (Patient taking differently: Take 100 mg by mouth daily.) 30 tablet 11   oxyCODONE-acetaminophen (PERCOCET) 5-325 MG tablet Take 1 tablet by mouth every 6 hours 5 tablet 0   pregabalin (LYRICA) 75 MG capsule Take 1 capsule (75 mg total) by mouth daily. 30 capsule 2   torsemide (DEMADEX) 100 MG tablet Take 1 tablet by mouth once a day 90 tablet 3   No current facility-administered medications for this visit.    Allergies:   Sulfa antibiotics, Oxycodone, Oxycontin [oxycodone hcl], Penicillins, Prednisone, and Shellfish-derived products    Social History:  The patient  reports that he has never smoked. He has never used smokeless tobacco. He reports that he does not drink alcohol and does not use drugs.   Family History:  The patient's family history includes Coronary artery disease in an other family member; Diabetes in an other family member; Hypertension in an other family member; Stroke in an other family member.    ROS:  Please see the history of present illness.   Otherwise, review of systems are positive for none.   All other systems are reviewed and negative.    PHYSICAL EXAM: VS:  BP (!) 200/100 (BP Location: Left Arm, Patient Position: Sitting, Cuff Size: Normal)    Pulse 83    Ht 5' 11" (1.803 m)    Wt 245 lb 6 oz (111.3 kg)    SpO2 96%    BMI 34.22 kg/m  , BMI Body mass index is 34.22 kg/m. GEN: Well nourished, well developed,  in no acute distress  HEENT: normal  Neck: no JVD, carotid bruits, or masses Cardiac: RRR; no murmurs, rubs, or gallops, mild bilateral leg edema  Respiratory:  clear to auscultation bilaterally, normal work of breathing GI: soft, nontender, nondistended, + BS MS: no deformity or atrophy  Skin: warm and dry, no rash Neuro:  Strength and sensation are intact Psych: euthymic mood, full affect Radial pulses normal bilaterally.  EKG:  EKG is ordered today. The ekg ordered today demonstrates normal sinus rhythm with nonspecific T wave changes.    Recent Labs: 01/22/2021: ALT 26; B Natriuretic Peptide 89.9; Hemoglobin 16.0; Platelets 289 02/10/2021: BUN 77; Creatinine, Ser 7.74; Potassium 5.0; Sodium 132  Lipid Panel    Component Value Date/Time   CHOL 178 02/10/2021 1540   TRIG 145.0 02/10/2021 1540   HDL 53.40 02/10/2021 1540   CHOLHDL 3 02/10/2021 1540   VLDL 29.0 02/10/2021 1540   LDLCALC 95 02/10/2021 1540      Wt Readings from Last 3 Encounters:  10/06/21 245 lb 6 oz (111.3 kg)  10/02/21 244 lb 12.8 oz (111 kg)  04/14/21 260 lb 9.6 oz (118.2 kg)      PAD Screen 03/31/2018  Previous PAD dx? No  Previous surgical procedure? No  Pain with walking? No  Feet/toe relief with dangling? No  Painful, non-healing ulcers? No  Extremities discolored? No      ASSESSMENT AND PLAN:  1.  Preop cardiovascular evaluation for kidney transplant: The patient has prolonged history of diabetes mellitus and at risk of coronary artery disease.  Left heart catheterization is recommended as part of the pretransplant evaluation.  I discussed the procedure in details as well as risks and benefits.  Planned access is via the right radial artery.    2.  Essential hypertension: Blood pressure is very difficult to control.  He is no longer on losartan due to hyperkalemia.  This is managed by nephrology.  Consideration is being made to switch him from peritoneal to hemodialysis.  3.  End-stage  renal disease: Currently on peritoneal dialysis.  6.  Hyperlipidemia: Currently on atorvastatin 80 mg daily.  Recommend a target LDL of less than 70 given that he is diabetic.    Disposition:   FU with me in 1 month.  Signed,  Kathlyn Sacramento, MD  10/06/2021 4:49 PM    [place]

## 2021-10-06 NOTE — Progress Notes (Signed)
°  °Cardiology Office Note ° ° °Date:  10/06/2021  ° °ID:  Lash D Gouveia, DOB 04/09/1971, MRN [id number] ° °PCP:  Johnston, John D, MD  °Cardiologist:   Tinzley Dalia, MD  ° °Chief Complaint  °Patient presents with  ° Other  °  Post cardiac cath. Meds reviewed verbally with pt.  ° ° °  °History of Present Illness: °[name] is a 51 y.o. male who is here today for a follow-up visit regarding preoperative cardiovascular evaluation before kidney transplant. ° °He has known history of type 1 diabetes, end-stage renal disease on peritoneal dialysis, sleep apnea, herniated cervical disc, obstructive sleep apnea, hypertension and hyperlipidemia.  He had cardiac catheterization in the remote past due to tachycardia which was reportedly normal.   Previous Holter monitor in 2013 showed occasional PVCs and average heart rate of 96 bpm. ° °He was hospitalized in December 2020 with DKA and elevated troponin to 10,000.  Echo showed normal LV systolic function.  He had no symptoms of acute coronary syndrome at that time.  He underwent an outpatient Lexiscan Myoview which was normal. ° °He is on the kidney transplant list at [facility].  He did undergo a Lexiscan Myoview in March 2022 which showed no evidence of ischemia with normal ejection fraction.  He is now in the process of getting listed for kidney transplant at [facility].  Cardiac catheterization is required before listing on the transplant list.  He denies chest pain or shortness of breath.  Blood pressure is extremely difficult to control with medications. ° °Past Medical History:  °Diagnosis Date  ° ESRD (end stage renal disease) (HCC)   ° Gastroparesis   ° Herniated cervical disc   ° HTN (hypertension)   ° Hypercholesteremia   ° Morbid obesity (HCC)   ° Peritoneal dialysis status (HCC)   ° PONV (postoperative nausea and vomiting)   ° after peritoneal dialysis catheter was placed  ° S/P cardiac cath   ° a. 10-15 yrs ago at [facility] due to tachycardia, reportedly  normal. b. Normal ETT 03/2012.  ° Sinus tachycardia   ° a. 24-hr Holter 03/2012 - SR, occ PVCs, no VT, avg HR 96bpm.  ° TIA (transient ischemic attack)   ° Type 1 diabetes mellitus (HCC)   ° a. With insulin pump.  ° ° °Past Surgical History:  °Procedure Laterality Date  ° BIOPSY  10/03/2019  ° Procedure: BIOPSY;  Surgeon: Nandigam, Kavitha V, MD;  [facility];  Service: Endoscopy;;  ° cervical neck fusion    ° COLONOSCOPY WITH PROPOFOL N/A 01/02/2021  ° Procedure: COLONOSCOPY WITH PROPOFOL;  Surgeon: Wohl, Darren, MD;  Location: [facility];  Service: Endoscopy;  Laterality: N/A;  priority 4 °DIABETIC needs potassium draw  ° ESOPHAGOGASTRODUODENOSCOPY (EGD) WITH PROPOFOL N/A 10/03/2019  ° Procedure: ESOPHAGOGASTRODUODENOSCOPY (EGD) WITH PROPOFOL;  Surgeon: Nandigam, Kavitha V, MD;  [facility];  Service: Endoscopy;  Laterality: N/A;  ° HERNIA REPAIR    ° bilateral  ° LEFT HEART CATHETERIZATION WITH CORONARY ANGIOGRAM N/A 12/18/2013  ° Procedure: LEFT HEART CATHETERIZATION WITH CORONARY ANGIOGRAM;  Surgeon: [name] M Jordan, MD;  Location: [facility];  Service: Cardiovascular;  Laterality: N/A;  ° POLYPECTOMY  01/02/2021  ° Procedure: POLYPECTOMY;  Surgeon: Wohl, Darren, MD;  Location: [facility];  Service: Endoscopy;;  ° ° ° °Current Outpatient Medications  °Medication Sig Dispense Refill  ° acetaminophen (TYLENOL) 500 MG tablet Take 500-1,000 mg by mouth every 6 (six) hours as needed for   mild pain or fever.    ° amLODipine (NORVASC) 10 MG tablet TAKE 1 TABLET BY MOUTH ONCE DAILY (Patient taking differently: Take 10 mg by mouth daily.) 30 tablet 11  ° atorvastatin (LIPITOR) 80 MG tablet TAKE 1 TABLET BY MOUTH ONCE DAILY (Patient taking differently: Take 80 mg by mouth daily.) 30 tablet 11  ° carvedilol (COREG) 25 MG tablet take one tablet by mouth 2 times daily with meals 360 tablet 0  ° cinacalcet (SENSIPAR) 60 MG tablet Take 1 tablet (60 mg total) by mouth daily. 90 tablet 3  °  cloNIDine (CATAPRES) 0.1 MG tablet Take 0.1 mg by mouth 2 (two) times daily.    ° ferric citrate (AURYXIA) 1 GM 210 MG(Fe) tablet Take 3 tablets by mouth 3 times a day with a meal 270 tablet 12  ° gentamicin cream (GARAMYCIN) 0.1 % Apply pea size amount to exit size daily. 30 g 12  ° Glucagon, rDNA, (GLUCAGON EMERGENCY) 1 MG KIT Inject into the muscle as directed for severe sugar. 2 kit 0  ° insulin lispro (HUMALOG) 100 UNIT/ML injection Use up to 100 units total via insulin pump daily 90 mL 0  ° losartan (COZAAR) 100 MG tablet TAKE 1 TABLET BY MOUTH ONCE DAILY (Patient taking differently: Take 100 mg by mouth daily.) 30 tablet 11  ° oxyCODONE-acetaminophen (PERCOCET) 5-325 MG tablet Take 1 tablet by mouth every 6 hours 5 tablet 0  ° pregabalin (LYRICA) 75 MG capsule Take 1 capsule (75 mg total) by mouth daily. 30 capsule 2  ° torsemide (DEMADEX) 100 MG tablet Take 1 tablet by mouth once a day 90 tablet 3  ° °No current facility-administered medications for this visit.  ° ° °Allergies:   Sulfa antibiotics, Oxycodone, Oxycontin [oxycodone hcl], Penicillins, Prednisone, and Shellfish-derived products  ° ° °Social History:  The patient  reports that he has never smoked. He has never used smokeless tobacco. He reports that he does not drink alcohol and does not use drugs.  ° °Family History:  The patient's family history includes Coronary artery disease in an other family member; Diabetes in an other family member; Hypertension in an other family member; Stroke in an other family member.  ° ° °ROS:  Please see the history of present illness.   Otherwise, review of systems are positive for none.   All other systems are reviewed and negative.  ° ° °PHYSICAL EXAM: °VS:  BP (!) 200/100 (BP Location: Left Arm, Patient Position: Sitting, Cuff Size: Normal)    Pulse 83    Ht 5' 11" (1.803 m)    Wt 245 lb 6 oz (111.3 kg)    SpO2 96%    BMI 34.22 kg/m²  , BMI Body mass index is 34.22 kg/m². °GEN: Well nourished, well developed,  in no acute distress  °HEENT: normal  °Neck: no JVD, carotid bruits, or masses °Cardiac: RRR; no murmurs, rubs, or gallops, mild bilateral leg edema  °Respiratory:  clear to auscultation bilaterally, normal work of breathing °GI: soft, nontender, nondistended, + BS °MS: no deformity or atrophy  °Skin: warm and dry, no rash °Neuro:  Strength and sensation are intact °Psych: euthymic mood, full affect °Radial pulses normal bilaterally. ° °EKG:  EKG is ordered today. °The ekg ordered today demonstrates normal sinus rhythm with nonspecific T wave changes. ° ° ° °Recent Labs: °01/22/2021: ALT 26; B Natriuretic Peptide 89.9; Hemoglobin 16.0; Platelets 289 °02/10/2021: BUN 77; Creatinine, Ser 7.74; Potassium 5.0; Sodium 132  ° ° °Lipid Panel °   °  Lipid Panel    Component Value Date/Time   CHOL 178 02/10/2021 1540   TRIG 145.0 02/10/2021 1540   HDL 53.40 02/10/2021 1540   CHOLHDL 3 02/10/2021 1540   VLDL 29.0 02/10/2021 1540   LDLCALC 95 02/10/2021 1540      Wt Readings from Last 3 Encounters:  10/06/21 245 lb 6 oz (111.3 kg)  10/02/21 244 lb 12.8 oz (111 kg)  04/14/21 260 lb 9.6 oz (118.2 kg)      PAD Screen 03/31/2018  Previous PAD dx? No  Previous surgical procedure? No  Pain with walking? No  Feet/toe relief with dangling? No  Painful, non-healing ulcers? No  Extremities discolored? No      ASSESSMENT AND PLAN:  1.  Preop cardiovascular evaluation for kidney transplant: The patient has prolonged history of diabetes mellitus and at risk of coronary artery disease.  Left heart catheterization is recommended as part of the pretransplant evaluation.  I discussed the procedure in details as well as risks and benefits.  Planned access is via the right radial artery.    2.  Essential hypertension: Blood pressure is very difficult to control.  He is no longer on losartan due to hyperkalemia.  This is managed by nephrology.  Consideration is being made to switch him from peritoneal to hemodialysis.  3.  End-stage  renal disease: Currently on peritoneal dialysis.  6.  Hyperlipidemia: Currently on atorvastatin 80 mg daily.  Recommend a target LDL of less than 70 given that he is diabetic.    Disposition:   FU with me in 1 month.  Signed,  Kathlyn Sacramento, MD  10/06/2021 4:49 PM    [place]

## 2021-10-06 NOTE — Patient Instructions (Addendum)
Medication Instructions:  Your physician recommends that you continue on your current medications as directed. Please refer to the Current Medication list given to you today.  *If you need a refill on your cardiac medications before your next appointment, please call your pharmacy*   Lab Work: Your physician recommends that you return for lab work (bmp, cbc) in: early next week  If you have labs (blood work) drawn today and your tests are completely normal, you will receive your results only by: MyChart Message (if you have MyChart) OR A paper copy in the mail If you have any lab test that is abnormal or we need to change your treatment, we will call you to review the results.   Testing/Procedures: Your physician has requested that you have a cardiac catheterization. Cardiac catheterization is used to diagnose and/or treat various heart conditions. Doctors may recommend this procedure for a number of different reasons. The most common reason is to evaluate chest pain. Chest pain can be a symptom of coronary artery disease (CAD), and cardiac catheterization can show whether plaque is narrowing or blocking your hearts arteries. This procedure is also used to evaluate the valves, as well as measure the blood flow and oxygen levels in different parts of your heart. For further information please visit [email or web address]. Please follow instruction sheet, as given.    Follow-Up: At [facility], you and your health needs are our priority.  As part of our continuing mission to provide you with exceptional heart care, we have created designated Provider Care Teams.  These Care Teams include your primary Cardiologist (physician) and Advanced Practice Providers (APPs -  Physician Assistants and Nurse Practitioners) who all work together to provide you with the care you need, when you need it.  We recommend signing up for the patient portal called "MyChart".  Sign up information is provided on this  After Visit Summary.  MyChart is used to connect with patients for Virtual Visits (Telemedicine).  Patients are able to view lab/test results, encounter notes, upcoming appointments, etc.  Non-urgent messages can be sent to your provider as well.   To learn more about what you can do with MyChart, go to [email or web address].    Your next appointment:   4 week(s)  The format for your next appointment:   In Person  Provider:   You may see  Kathlyn Sacramento, MD or one of the following Advanced Practice Providers on your designated Care Team:   Murray Hodgkins, NP Christell Faith, PA-C Cadence Kathlen Mody, [place]    Other Instructions  [address], [place] [address] Dept: [phone] Loc: [place]  10/06/2021  You are scheduled for a Cardiac Catheterization on Friday, January 13 with Dr. Kathlyn Sacramento.  1. Please arrive at [facility] [place], [place] [address] at 6:30 AM (This time is one hour before your procedure to ensure your preparation). Free valet parking service is available.   Special note: Every effort is made to have your procedure done on time. Please understand that emergencies sometimes delay scheduled procedures.  2. Diet: Do not eat solid foods after midnight.  The patient may have clear liquids until 5am upon the day of the procedure.  3. Labs: You will need to have blood drawn early next week at our office. You do not need to be fasting.  4. Medication instructions in preparation for your procedure:  Contrast Allergy: No   Pause your insulin pump the morning of the procedure  HOLD  torsemide the morning or the procedure    On the morning of your procedure, take your Aspirin 81 mg and any morning medicines NOT listed above.  You may use sips of water.  5. Plan for one night stay--bring personal  belongings. 6. Bring a current list of your medications and current insurance cards. 7. You MUST have a responsible person to drive you home. 8. Someone MUST be with you the first 24 hours after you arrive home or your discharge will be delayed. 9. Please wear clothes that are easy to get on and off and wear slip-on shoes.  Thank you for allowing [place] to care for you!   -- [facility]

## 2021-10-07 DIAGNOSIS — Z992 Dependence on renal dialysis: Secondary | ICD-10-CM | POA: Diagnosis not present

## 2021-10-07 DIAGNOSIS — N186 End stage renal disease: Secondary | ICD-10-CM | POA: Diagnosis not present

## 2021-10-08 DIAGNOSIS — N186 End stage renal disease: Secondary | ICD-10-CM | POA: Diagnosis not present

## 2021-10-08 DIAGNOSIS — Z992 Dependence on renal dialysis: Secondary | ICD-10-CM | POA: Diagnosis not present

## 2021-10-09 ENCOUNTER — Ambulatory Visit: Payer: 59 | Admitting: Podiatry

## 2021-10-09 ENCOUNTER — Ambulatory Visit: Payer: 59

## 2021-10-09 ENCOUNTER — Other Ambulatory Visit: Payer: Self-pay

## 2021-10-09 DIAGNOSIS — N186 End stage renal disease: Secondary | ICD-10-CM | POA: Diagnosis not present

## 2021-10-09 DIAGNOSIS — E109 Type 1 diabetes mellitus without complications: Secondary | ICD-10-CM | POA: Diagnosis not present

## 2021-10-09 DIAGNOSIS — D509 Iron deficiency anemia, unspecified: Secondary | ICD-10-CM | POA: Diagnosis not present

## 2021-10-09 DIAGNOSIS — E08621 Diabetes mellitus due to underlying condition with foot ulcer: Secondary | ICD-10-CM

## 2021-10-09 DIAGNOSIS — L97522 Non-pressure chronic ulcer of other part of left foot with fat layer exposed: Secondary | ICD-10-CM

## 2021-10-09 DIAGNOSIS — E0843 Diabetes mellitus due to underlying condition with diabetic autonomic (poly)neuropathy: Secondary | ICD-10-CM

## 2021-10-09 DIAGNOSIS — Z992 Dependence on renal dialysis: Secondary | ICD-10-CM | POA: Diagnosis not present

## 2021-10-09 DIAGNOSIS — Z794 Long term (current) use of insulin: Secondary | ICD-10-CM | POA: Diagnosis not present

## 2021-10-10 DIAGNOSIS — Z992 Dependence on renal dialysis: Secondary | ICD-10-CM | POA: Diagnosis not present

## 2021-10-10 DIAGNOSIS — N186 End stage renal disease: Secondary | ICD-10-CM | POA: Diagnosis not present

## 2021-10-11 DIAGNOSIS — N186 End stage renal disease: Secondary | ICD-10-CM | POA: Diagnosis not present

## 2021-10-11 DIAGNOSIS — Z992 Dependence on renal dialysis: Secondary | ICD-10-CM | POA: Diagnosis not present

## 2021-10-11 NOTE — Progress Notes (Signed)
° °  Subjective:  Patient presents today for follow-up evaluation of an ulcer to the left hallux.  patient states that he has been doing well.  He believes there is some improvement of the wound.  He continues to try and offload the wound is much as possible.  He presents for further treatment and evaluation  Past Medical History:  Diagnosis Date   ESRD (end stage renal disease) ([place])    Gastroparesis    Herniated cervical disc    HTN (hypertension)    Hypercholesteremia    Morbid obesity ([place])    Peritoneal dialysis status ([place])    PONV (postoperative nausea and vomiting)    after peritoneal dialysis catheter was placed   S/P cardiac cath    a. 10-15 yrs ago at [facility] due to tachycardia, reportedly normal. b. Normal ETT 03/2012.   Sinus tachycardia    a. 24-hr Holter 03/2012 - SR, occ PVCs, no VT, avg HR 96bpm.   TIA (transient ischemic attack)    Type 1 diabetes mellitus ([place])    a. With insulin pump.       Objective/Physical Exam Neurovascular status intact. Maceration noted to the incision site has improved significantly.   Wound #1 noted to the left hallux measuring 1.0 x 0.6 x 0.2 cm. Granulation tissue.  Wound base is red.  No active bleeding.  No serous drainage.  Wound appears stable.  Periwound is  callused.  Radiographic exam LT foot 10/09/2021: No significant change compared to prior x-rays.  Joint spaces preserved.  No erosion or cortical destruction that would be concerning for osteomyelitis of the toe.  Assessment: 1. s/p exostectomy left hallux. DOS: 11/16/2018 2. 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 Patient was evaluated.  Wound appears stable  2.  Medically necessary excisional debridement including subcutaneous tissue was performed using a tissue nipper.  Excisional debridement of all necrotic nonviable tissue down to the healthy bleeding viable tissue was performed with post debridement measurement same as pre- 3.    Continue wearing the cam boot or diabetic shoes with insoles and offloading felt pads to the great toe 4.  Appt with pedorthist for DM shoes and insoles pending 5.  Continue Prisma collagen dressings that were provided previously 6.  Return to clinic 3 weeks  *Spouse's name is Lattie Haw.  Currently on the kidney transplant recipient list at [facility]  Edrick Kins, DPM [facility]  Dr. Edrick Kins, DPM    [address], [place] [address]                Office [phone]  Fax [phone]

## 2021-10-12 DIAGNOSIS — N186 End stage renal disease: Secondary | ICD-10-CM | POA: Diagnosis not present

## 2021-10-12 DIAGNOSIS — Z992 Dependence on renal dialysis: Secondary | ICD-10-CM | POA: Diagnosis not present

## 2021-10-13 ENCOUNTER — Other Ambulatory Visit: Payer: 59 | Admitting: *Deleted

## 2021-10-13 ENCOUNTER — Other Ambulatory Visit: Payer: Self-pay

## 2021-10-13 DIAGNOSIS — Z01810 Encounter for preprocedural cardiovascular examination: Secondary | ICD-10-CM | POA: Diagnosis not present

## 2021-10-13 DIAGNOSIS — N186 End stage renal disease: Secondary | ICD-10-CM | POA: Diagnosis not present

## 2021-10-13 DIAGNOSIS — Z992 Dependence on renal dialysis: Secondary | ICD-10-CM | POA: Diagnosis not present

## 2021-10-13 LAB — CBC WITH DIFFERENTIAL/PLATELET
Basophils Absolute: 0.1 10*3/uL (ref 0.0–0.2)
Basos: 1 %
EOS (ABSOLUTE): 0.4 10*3/uL (ref 0.0–0.4)
Eos: 6 %
Hematocrit: 32.3 % — ABNORMAL LOW (ref 37.5–51.0)
Hemoglobin: 10.6 g/dL — ABNORMAL LOW (ref 13.0–17.7)
Lymphocytes Absolute: 1.3 10*3/uL (ref 0.7–3.1)
Lymphs: 22 %
MCH: 29 pg (ref 26.6–33.0)
MCHC: 32.8 g/dL (ref 31.5–35.7)
MCV: 88 fL (ref 79–97)
Monocytes Absolute: 0.6 10*3/uL (ref 0.1–0.9)
Monocytes: 10 %
Neutrophils Absolute: 3.7 10*3/uL (ref 1.4–7.0)
Neutrophils: 61 %
Platelets: 272 10*3/uL (ref 150–450)
RBC: 3.66 x10E6/uL — ABNORMAL LOW (ref 4.14–5.80)
RDW: 15.2 % (ref 11.6–15.4)
WBC: 6 10*3/uL (ref 3.4–10.8)

## 2021-10-13 LAB — BASIC METABOLIC PANEL
BUN/Creatinine Ratio: 7 — ABNORMAL LOW (ref 9–20)
BUN: 99 mg/dL (ref 6–24)
CO2: 23 mmol/L (ref 20–29)
Calcium: 7.9 mg/dL — ABNORMAL LOW (ref 8.7–10.2)
Chloride: 98 mmol/L (ref 96–106)
Creatinine, Ser: 14.09 mg/dL — ABNORMAL HIGH (ref 0.76–1.27)
Glucose: 211 mg/dL — ABNORMAL HIGH (ref 70–99)
Potassium: 4.6 mmol/L (ref 3.5–5.2)
Sodium: 136 mmol/L (ref 134–144)
eGFR: 4 mL/min/{1.73_m2} — ABNORMAL LOW (ref >59)

## 2021-10-14 DIAGNOSIS — Z992 Dependence on renal dialysis: Secondary | ICD-10-CM | POA: Diagnosis not present

## 2021-10-14 DIAGNOSIS — N186 End stage renal disease: Secondary | ICD-10-CM | POA: Diagnosis not present

## 2021-10-15 DIAGNOSIS — Z992 Dependence on renal dialysis: Secondary | ICD-10-CM | POA: Diagnosis not present

## 2021-10-15 DIAGNOSIS — N186 End stage renal disease: Secondary | ICD-10-CM | POA: Diagnosis not present

## 2021-10-16 ENCOUNTER — Encounter
Admission: RE | Disposition: A | Discharge status: Home / Self Care | Payer: Self-pay | Source: Ambulatory Visit | Attending: Cardiovascular Disease

## 2021-10-16 ENCOUNTER — Ambulatory Visit
Admission: RE | Admit: 2021-10-16 | Discharge: 2021-10-16 | Disposition: A | Discharge status: Home / Self Care | Payer: 59 | Source: Ambulatory Visit | Attending: Cardiovascular Disease | Admitting: Cardiovascular Disease

## 2021-10-16 ENCOUNTER — Other Ambulatory Visit: Payer: Self-pay

## 2021-10-16 ENCOUNTER — Encounter: Payer: Self-pay | Admitting: Cardiovascular Disease

## 2021-10-16 DIAGNOSIS — N186 End stage renal disease: Secondary | ICD-10-CM | POA: Insufficient documentation

## 2021-10-16 DIAGNOSIS — Z01810 Encounter for preprocedural cardiovascular examination: Secondary | ICD-10-CM

## 2021-10-16 DIAGNOSIS — E1043 Type 1 diabetes mellitus with diabetic autonomic (poly)neuropathy: Secondary | ICD-10-CM | POA: Diagnosis not present

## 2021-10-16 DIAGNOSIS — I1311 Hypertensive heart and chronic kidney disease without heart failure, with stage 5 chronic kidney disease, or end stage renal disease: Secondary | ICD-10-CM | POA: Insufficient documentation

## 2021-10-16 DIAGNOSIS — E1022 Type 1 diabetes mellitus with diabetic chronic kidney disease: Secondary | ICD-10-CM | POA: Diagnosis not present

## 2021-10-16 DIAGNOSIS — Z79899 Other long term (current) drug therapy: Secondary | ICD-10-CM | POA: Insufficient documentation

## 2021-10-16 DIAGNOSIS — I2510 Atherosclerotic heart disease of native coronary artery without angina pectoris: Secondary | ICD-10-CM | POA: Diagnosis not present

## 2021-10-16 DIAGNOSIS — Z992 Dependence on renal dialysis: Secondary | ICD-10-CM | POA: Insufficient documentation

## 2021-10-16 HISTORY — DX: Obstructive sleep apnea (adult) (pediatric): G47.33

## 2021-10-16 HISTORY — PX: LEFT HEART CATH AND CORONARY ANGIOGRAPHY: CATH118249

## 2021-10-16 SURGERY — LEFT HEART CATH AND CORONARY ANGIOGRAPHY
Anesthesia: Moderate Sedation | Laterality: Left

## 2021-10-16 MED ORDER — ACETAMINOPHEN 325 MG PO TABS
650.0000 mg | ORAL_TABLET | ORAL | Status: DC | PRN
  Administered 2021-10-16: 650 mg via ORAL

## 2021-10-16 MED ORDER — SODIUM CHLORIDE 0.9% FLUSH: 3.0000 mL | INTRAVENOUS | Status: DC | PRN

## 2021-10-16 MED ORDER — ACETAMINOPHEN 325 MG PO TABS
ORAL_TABLET | ORAL | Status: AC
  Filled 2021-10-16: qty 2

## 2021-10-16 MED ORDER — SODIUM CHLORIDE 0.9 % IV SOLN: INTRAVENOUS | Status: DC

## 2021-10-16 MED ORDER — SODIUM CHLORIDE 0.9% FLUSH: 3.0000 mL | Freq: Two times a day (BID) | INTRAVENOUS | Status: DC

## 2021-10-16 MED ORDER — VERAPAMIL HCL 2.5 MG/ML IV SOLN
INTRAVENOUS | Status: DC | PRN
  Administered 2021-10-16: 2.5 mg via INTRA_ARTERIAL

## 2021-10-16 MED ORDER — ASPIRIN 81 MG PO CHEW
CHEWABLE_TABLET | ORAL | Status: AC
  Filled 2021-10-16: qty 1

## 2021-10-16 MED ORDER — SODIUM CHLORIDE 0.9 % IV SOLN: 250.0000 mL | INTRAVENOUS | Status: DC | PRN

## 2021-10-16 MED ORDER — ONDANSETRON HCL 4 MG/2ML IJ SOLN
INTRAMUSCULAR | Status: DC | PRN
  Administered 2021-10-16: 4 mg via INTRAVENOUS

## 2021-10-16 MED ORDER — VERAPAMIL HCL 2.5 MG/ML IV SOLN
INTRAVENOUS | Status: AC
  Filled 2021-10-16: qty 2

## 2021-10-16 MED ORDER — MIDAZOLAM HCL 2 MG/2ML IJ SOLN
INTRAMUSCULAR | Status: AC
  Filled 2021-10-16: qty 2

## 2021-10-16 MED ORDER — HEPARIN (PORCINE) IN NACL 1000-0.9 UT/500ML-% IV SOLN
INTRAVENOUS | Status: AC
  Filled 2021-10-16: qty 1000

## 2021-10-16 MED ORDER — ASPIRIN 81 MG PO CHEW
81.0000 mg | CHEWABLE_TABLET | ORAL | Status: AC
  Administered 2021-10-16: 81 mg via ORAL

## 2021-10-16 MED ORDER — ONDANSETRON HCL 4 MG/2ML IJ SOLN: 4.0000 mg | Freq: Four times a day (QID) | INTRAMUSCULAR | Status: DC | PRN

## 2021-10-16 MED ORDER — ONDANSETRON HCL 4 MG/2ML IJ SOLN
INTRAMUSCULAR | Status: AC
  Filled 2021-10-16: qty 2

## 2021-10-16 MED ORDER — HEPARIN SODIUM (PORCINE) 1000 UNIT/ML IJ SOLN
INTRAMUSCULAR | Status: AC
  Filled 2021-10-16: qty 10

## 2021-10-16 MED ORDER — IOHEXOL 300 MG/ML  SOLN
INTRAMUSCULAR | Status: DC | PRN
  Administered 2021-10-16: 61 mL

## 2021-10-16 MED ORDER — FENTANYL CITRATE (PF) 100 MCG/2ML IJ SOLN
INTRAMUSCULAR | Status: DC | PRN
  Administered 2021-10-16 (×2): 50 ug via INTRAVENOUS

## 2021-10-16 MED ORDER — LIDOCAINE HCL (PF) 1 % IJ SOLN
INTRAMUSCULAR | Status: DC | PRN
  Administered 2021-10-16: 2 mL

## 2021-10-16 MED ORDER — MIDAZOLAM HCL 2 MG/2ML IJ SOLN
INTRAMUSCULAR | Status: DC | PRN
  Administered 2021-10-16: 1 mg via INTRAVENOUS

## 2021-10-16 MED ORDER — FENTANYL CITRATE (PF) 100 MCG/2ML IJ SOLN
INTRAMUSCULAR | Status: AC
  Filled 2021-10-16: qty 2

## 2021-10-16 MED ORDER — LABETALOL HCL 5 MG/ML IV SOLN: 10.0000 mg | INTRAVENOUS | Status: DC | PRN

## 2021-10-16 MED ORDER — HEPARIN SODIUM (PORCINE) 1000 UNIT/ML IJ SOLN
INTRAMUSCULAR | Status: DC | PRN
  Administered 2021-10-16: 4000 [IU] via INTRAVENOUS

## 2021-10-16 MED ORDER — HEPARIN (PORCINE) IN NACL 2000-0.9 UNIT/L-% IV SOLN
INTRAVENOUS | Status: DC | PRN
  Administered 2021-10-16: 1000 mL

## 2021-10-16 MED ORDER — LIDOCAINE HCL 1 % IJ SOLN
INTRAMUSCULAR | Status: AC
  Filled 2021-10-16: qty 20

## 2021-10-16 SURGICAL SUPPLY — 12 items
CATH OPTITORQUE JACKY 4.0 5F (CATHETERS) ×1 IMPLANT
DRAPE BRACHIAL (DRAPES) ×1 IMPLANT
GLIDESHEATH SLEND SS 6F .021 (SHEATH) ×1 IMPLANT
GUIDEWIRE INQWIRE 1.5J.035X260 (WIRE) IMPLANT
INQWIRE 1.5J .035X260CM (WIRE) ×2
KIT ENCORE 26 ADVANTAGE (KITS) ×1 IMPLANT
KIT SYRINGE INJ CVI SPIKEX1 (MISCELLANEOUS) ×1 IMPLANT
PACK CARDIAC CATH (CUSTOM PROCEDURE TRAY) ×2 IMPLANT
PROTECTION STATION PRESSURIZED (MISCELLANEOUS) ×2
SET ATX SIMPLICITY (MISCELLANEOUS) ×1 IMPLANT
STATION PROTECTION PRESSURIZED (MISCELLANEOUS) IMPLANT
WIRE HITORQ VERSACORE ST 145CM (WIRE) ×1 IMPLANT

## 2021-10-16 NOTE — Interval H&P Note (Signed)
History and Physical Interval Note:  10/16/2021 7:58 AM  [name]  has presented today for surgery, with the diagnosis of LT Cath   Preoperative cardiovascular exam.  The various methods of treatment have been discussed with the patient and family. After consideration of risks, benefits and other options for treatment, the patient has consented to  Procedure(s): LEFT HEART CATH AND CORONARY ANGIOGRAPHY (Left) as a surgical intervention.  The patient's history has been reviewed, patient examined, no change in status, stable for surgery.  I have reviewed the patient's chart and labs.  Questions were answered to the patient's satisfaction.     Kathlyn Sacramento

## 2021-10-17 DIAGNOSIS — N186 End stage renal disease: Secondary | ICD-10-CM | POA: Diagnosis not present

## 2021-10-17 DIAGNOSIS — Z992 Dependence on renal dialysis: Secondary | ICD-10-CM | POA: Diagnosis not present

## 2021-10-18 DIAGNOSIS — N186 End stage renal disease: Secondary | ICD-10-CM | POA: Diagnosis not present

## 2021-10-18 DIAGNOSIS — Z992 Dependence on renal dialysis: Secondary | ICD-10-CM | POA: Diagnosis not present

## 2021-10-19 DIAGNOSIS — N186 End stage renal disease: Secondary | ICD-10-CM | POA: Diagnosis not present

## 2021-10-19 DIAGNOSIS — Z992 Dependence on renal dialysis: Secondary | ICD-10-CM | POA: Diagnosis not present

## 2021-10-20 ENCOUNTER — Other Ambulatory Visit (HOSPITAL_COMMUNITY): Payer: Self-pay

## 2021-10-20 DIAGNOSIS — Z992 Dependence on renal dialysis: Secondary | ICD-10-CM | POA: Diagnosis not present

## 2021-10-20 DIAGNOSIS — N186 End stage renal disease: Secondary | ICD-10-CM | POA: Diagnosis not present

## 2021-10-20 MED ORDER — VELPHORO 500 MG PO CHEW
CHEWABLE_TABLET | ORAL | 12 refills | Status: DC
  Filled 2021-10-20: qty 180, 30d supply, fill #0

## 2021-10-21 ENCOUNTER — Other Ambulatory Visit (HOSPITAL_COMMUNITY): Payer: Self-pay

## 2021-10-21 DIAGNOSIS — N186 End stage renal disease: Secondary | ICD-10-CM | POA: Diagnosis not present

## 2021-10-21 DIAGNOSIS — Z992 Dependence on renal dialysis: Secondary | ICD-10-CM | POA: Diagnosis not present

## 2021-10-22 DIAGNOSIS — Z992 Dependence on renal dialysis: Secondary | ICD-10-CM | POA: Diagnosis not present

## 2021-10-22 DIAGNOSIS — N186 End stage renal disease: Secondary | ICD-10-CM | POA: Diagnosis not present

## 2021-10-23 DIAGNOSIS — Z992 Dependence on renal dialysis: Secondary | ICD-10-CM | POA: Diagnosis not present

## 2021-10-23 DIAGNOSIS — N186 End stage renal disease: Secondary | ICD-10-CM | POA: Diagnosis not present

## 2021-10-24 DIAGNOSIS — N186 End stage renal disease: Secondary | ICD-10-CM | POA: Diagnosis not present

## 2021-10-24 DIAGNOSIS — Z992 Dependence on renal dialysis: Secondary | ICD-10-CM | POA: Diagnosis not present

## 2021-10-25 DIAGNOSIS — N186 End stage renal disease: Secondary | ICD-10-CM | POA: Diagnosis not present

## 2021-10-25 DIAGNOSIS — Z992 Dependence on renal dialysis: Secondary | ICD-10-CM | POA: Diagnosis not present

## 2021-10-26 DIAGNOSIS — Z992 Dependence on renal dialysis: Secondary | ICD-10-CM | POA: Diagnosis not present

## 2021-10-26 DIAGNOSIS — N186 End stage renal disease: Secondary | ICD-10-CM | POA: Diagnosis not present

## 2021-10-27 DIAGNOSIS — N186 End stage renal disease: Secondary | ICD-10-CM | POA: Diagnosis not present

## 2021-10-27 DIAGNOSIS — Z992 Dependence on renal dialysis: Secondary | ICD-10-CM | POA: Diagnosis not present

## 2021-10-28 DIAGNOSIS — Z992 Dependence on renal dialysis: Secondary | ICD-10-CM | POA: Diagnosis not present

## 2021-10-28 DIAGNOSIS — N186 End stage renal disease: Secondary | ICD-10-CM | POA: Diagnosis not present

## 2021-10-29 DIAGNOSIS — I469 Cardiac arrest, cause unspecified: Secondary | ICD-10-CM | POA: Diagnosis not present

## 2021-10-29 DIAGNOSIS — Z992 Dependence on renal dialysis: Secondary | ICD-10-CM | POA: Diagnosis not present

## 2021-10-29 DIAGNOSIS — Z8674 Personal history of sudden cardiac arrest: Secondary | ICD-10-CM | POA: Diagnosis not present

## 2021-10-29 DIAGNOSIS — I517 Cardiomegaly: Secondary | ICD-10-CM | POA: Diagnosis not present

## 2021-10-29 DIAGNOSIS — G253 Myoclonus: Secondary | ICD-10-CM | POA: Diagnosis not present

## 2021-10-29 DIAGNOSIS — E8729 Other acidosis: Secondary | ICD-10-CM | POA: Diagnosis not present

## 2021-10-29 DIAGNOSIS — J939 Pneumothorax, unspecified: Secondary | ICD-10-CM | POA: Diagnosis not present

## 2021-10-29 DIAGNOSIS — E162 Hypoglycemia, unspecified: Secondary | ICD-10-CM | POA: Diagnosis not present

## 2021-10-29 DIAGNOSIS — J9811 Atelectasis: Secondary | ICD-10-CM | POA: Diagnosis not present

## 2021-10-29 DIAGNOSIS — E1022 Type 1 diabetes mellitus with diabetic chronic kidney disease: Secondary | ICD-10-CM | POA: Diagnosis not present

## 2021-10-29 DIAGNOSIS — I2510 Atherosclerotic heart disease of native coronary artery without angina pectoris: Secondary | ICD-10-CM | POA: Diagnosis not present

## 2021-10-29 DIAGNOSIS — Z9189 Other specified personal risk factors, not elsewhere classified: Secondary | ICD-10-CM | POA: Diagnosis not present

## 2021-10-29 DIAGNOSIS — E10641 Type 1 diabetes mellitus with hypoglycemia with coma: Secondary | ICD-10-CM | POA: Diagnosis not present

## 2021-10-29 DIAGNOSIS — R918 Other nonspecific abnormal finding of lung field: Secondary | ICD-10-CM | POA: Diagnosis not present

## 2021-10-29 DIAGNOSIS — Z7682 Awaiting organ transplant status: Secondary | ICD-10-CM | POA: Diagnosis not present

## 2021-10-29 DIAGNOSIS — R0789 Other chest pain: Secondary | ICD-10-CM | POA: Diagnosis not present

## 2021-10-29 DIAGNOSIS — K658 Other peritonitis: Secondary | ICD-10-CM | POA: Diagnosis not present

## 2021-10-29 DIAGNOSIS — K659 Peritonitis, unspecified: Secondary | ICD-10-CM | POA: Diagnosis not present

## 2021-10-29 DIAGNOSIS — E1065 Type 1 diabetes mellitus with hyperglycemia: Secondary | ICD-10-CM | POA: Diagnosis not present

## 2021-10-29 DIAGNOSIS — D631 Anemia in chronic kidney disease: Secondary | ICD-10-CM | POA: Diagnosis not present

## 2021-10-29 DIAGNOSIS — E10649 Type 1 diabetes mellitus with hypoglycemia without coma: Secondary | ICD-10-CM | POA: Diagnosis not present

## 2021-10-29 DIAGNOSIS — E1040 Type 1 diabetes mellitus with diabetic neuropathy, unspecified: Secondary | ICD-10-CM | POA: Diagnosis not present

## 2021-10-29 DIAGNOSIS — Z4682 Encounter for fitting and adjustment of non-vascular catheter: Secondary | ICD-10-CM | POA: Diagnosis not present

## 2021-10-29 DIAGNOSIS — J9601 Acute respiratory failure with hypoxia: Secondary | ICD-10-CM | POA: Diagnosis not present

## 2021-10-29 DIAGNOSIS — S2243XA Multiple fractures of ribs, bilateral, initial encounter for closed fracture: Secondary | ICD-10-CM | POA: Diagnosis not present

## 2021-10-29 DIAGNOSIS — I4581 Long QT syndrome: Secondary | ICD-10-CM | POA: Diagnosis not present

## 2021-10-29 DIAGNOSIS — J90 Pleural effusion, not elsewhere classified: Secondary | ICD-10-CM | POA: Diagnosis not present

## 2021-10-29 DIAGNOSIS — E11649 Type 2 diabetes mellitus with hypoglycemia without coma: Secondary | ICD-10-CM | POA: Diagnosis not present

## 2021-10-29 DIAGNOSIS — R4182 Altered mental status, unspecified: Secondary | ICD-10-CM | POA: Diagnosis not present

## 2021-10-29 DIAGNOSIS — R579 Shock, unspecified: Secondary | ICD-10-CM | POA: Diagnosis not present

## 2021-10-29 DIAGNOSIS — Z9911 Dependence on respirator [ventilator] status: Secondary | ICD-10-CM | POA: Diagnosis not present

## 2021-10-29 DIAGNOSIS — Z4659 Encounter for fitting and adjustment of other gastrointestinal appliance and device: Secondary | ICD-10-CM | POA: Diagnosis not present

## 2021-10-29 DIAGNOSIS — N186 End stage renal disease: Secondary | ICD-10-CM | POA: Diagnosis not present

## 2021-10-29 DIAGNOSIS — G9340 Encephalopathy, unspecified: Secondary | ICD-10-CM | POA: Diagnosis not present

## 2021-10-29 DIAGNOSIS — E8770 Fluid overload, unspecified: Secondary | ICD-10-CM | POA: Diagnosis not present

## 2021-10-29 DIAGNOSIS — Z4902 Encounter for fitting and adjustment of peritoneal dialysis catheter: Secondary | ICD-10-CM | POA: Diagnosis not present

## 2021-10-29 DIAGNOSIS — R601 Generalized edema: Secondary | ICD-10-CM | POA: Diagnosis not present

## 2021-10-29 DIAGNOSIS — E875 Hyperkalemia: Secondary | ICD-10-CM | POA: Diagnosis not present

## 2021-10-30 DIAGNOSIS — Z4682 Encounter for fitting and adjustment of non-vascular catheter: Secondary | ICD-10-CM | POA: Diagnosis not present

## 2021-10-30 DIAGNOSIS — N186 End stage renal disease: Secondary | ICD-10-CM | POA: Diagnosis not present

## 2021-10-30 DIAGNOSIS — R918 Other nonspecific abnormal finding of lung field: Secondary | ICD-10-CM | POA: Diagnosis not present

## 2021-10-30 DIAGNOSIS — I469 Cardiac arrest, cause unspecified: Secondary | ICD-10-CM | POA: Diagnosis not present

## 2021-10-30 DIAGNOSIS — I517 Cardiomegaly: Secondary | ICD-10-CM | POA: Diagnosis not present

## 2021-10-30 DIAGNOSIS — Z9911 Dependence on respirator [ventilator] status: Secondary | ICD-10-CM | POA: Diagnosis not present

## 2021-10-30 DIAGNOSIS — J939 Pneumothorax, unspecified: Secondary | ICD-10-CM | POA: Diagnosis not present

## 2021-10-30 DIAGNOSIS — I2510 Atherosclerotic heart disease of native coronary artery without angina pectoris: Secondary | ICD-10-CM | POA: Diagnosis not present

## 2021-10-30 DIAGNOSIS — Z992 Dependence on renal dialysis: Secondary | ICD-10-CM | POA: Diagnosis not present

## 2021-10-31 DIAGNOSIS — J90 Pleural effusion, not elsewhere classified: Secondary | ICD-10-CM | POA: Diagnosis not present

## 2021-10-31 DIAGNOSIS — J939 Pneumothorax, unspecified: Secondary | ICD-10-CM | POA: Diagnosis not present

## 2021-10-31 DIAGNOSIS — J9811 Atelectasis: Secondary | ICD-10-CM | POA: Diagnosis not present

## 2021-10-31 DIAGNOSIS — N186 End stage renal disease: Secondary | ICD-10-CM | POA: Diagnosis not present

## 2021-10-31 DIAGNOSIS — I2510 Atherosclerotic heart disease of native coronary artery without angina pectoris: Secondary | ICD-10-CM | POA: Diagnosis not present

## 2021-10-31 DIAGNOSIS — Z4682 Encounter for fitting and adjustment of non-vascular catheter: Secondary | ICD-10-CM | POA: Diagnosis not present

## 2021-10-31 DIAGNOSIS — I469 Cardiac arrest, cause unspecified: Secondary | ICD-10-CM | POA: Diagnosis not present

## 2021-10-31 DIAGNOSIS — Z9911 Dependence on respirator [ventilator] status: Secondary | ICD-10-CM | POA: Diagnosis not present

## 2021-10-31 DIAGNOSIS — Z992 Dependence on renal dialysis: Secondary | ICD-10-CM | POA: Diagnosis not present

## 2021-11-01 DIAGNOSIS — J939 Pneumothorax, unspecified: Secondary | ICD-10-CM | POA: Diagnosis not present

## 2021-11-01 DIAGNOSIS — E162 Hypoglycemia, unspecified: Secondary | ICD-10-CM | POA: Diagnosis not present

## 2021-11-01 DIAGNOSIS — N186 End stage renal disease: Secondary | ICD-10-CM | POA: Diagnosis not present

## 2021-11-01 DIAGNOSIS — I469 Cardiac arrest, cause unspecified: Secondary | ICD-10-CM | POA: Diagnosis not present

## 2021-11-01 DIAGNOSIS — Z992 Dependence on renal dialysis: Secondary | ICD-10-CM | POA: Diagnosis not present

## 2021-11-01 DIAGNOSIS — E10641 Type 1 diabetes mellitus with hypoglycemia with coma: Secondary | ICD-10-CM | POA: Diagnosis not present

## 2021-11-01 DIAGNOSIS — R0789 Other chest pain: Secondary | ICD-10-CM | POA: Diagnosis not present

## 2021-11-02 DIAGNOSIS — E162 Hypoglycemia, unspecified: Secondary | ICD-10-CM | POA: Diagnosis not present

## 2021-11-02 DIAGNOSIS — Z992 Dependence on renal dialysis: Secondary | ICD-10-CM | POA: Diagnosis not present

## 2021-11-02 DIAGNOSIS — I469 Cardiac arrest, cause unspecified: Secondary | ICD-10-CM | POA: Diagnosis not present

## 2021-11-02 DIAGNOSIS — J90 Pleural effusion, not elsewhere classified: Secondary | ICD-10-CM | POA: Diagnosis not present

## 2021-11-02 DIAGNOSIS — R0789 Other chest pain: Secondary | ICD-10-CM | POA: Diagnosis not present

## 2021-11-02 DIAGNOSIS — K659 Peritonitis, unspecified: Secondary | ICD-10-CM | POA: Diagnosis not present

## 2021-11-02 DIAGNOSIS — J9811 Atelectasis: Secondary | ICD-10-CM | POA: Diagnosis not present

## 2021-11-02 DIAGNOSIS — E10641 Type 1 diabetes mellitus with hypoglycemia with coma: Secondary | ICD-10-CM | POA: Diagnosis not present

## 2021-11-02 DIAGNOSIS — J939 Pneumothorax, unspecified: Secondary | ICD-10-CM | POA: Diagnosis not present

## 2021-11-02 DIAGNOSIS — E1065 Type 1 diabetes mellitus with hyperglycemia: Secondary | ICD-10-CM | POA: Diagnosis not present

## 2021-11-02 DIAGNOSIS — N186 End stage renal disease: Secondary | ICD-10-CM | POA: Diagnosis not present

## 2021-11-03 ENCOUNTER — Telehealth: Payer: Self-pay

## 2021-11-03 DIAGNOSIS — E1065 Type 1 diabetes mellitus with hyperglycemia: Secondary | ICD-10-CM | POA: Diagnosis not present

## 2021-11-03 DIAGNOSIS — I469 Cardiac arrest, cause unspecified: Secondary | ICD-10-CM | POA: Diagnosis not present

## 2021-11-03 DIAGNOSIS — Z992 Dependence on renal dialysis: Secondary | ICD-10-CM | POA: Diagnosis not present

## 2021-11-03 DIAGNOSIS — N186 End stage renal disease: Secondary | ICD-10-CM | POA: Diagnosis not present

## 2021-11-03 DIAGNOSIS — K659 Peritonitis, unspecified: Secondary | ICD-10-CM | POA: Diagnosis not present

## 2021-11-03 DIAGNOSIS — E162 Hypoglycemia, unspecified: Secondary | ICD-10-CM | POA: Diagnosis not present

## 2021-11-03 DIAGNOSIS — E1022 Type 1 diabetes mellitus with diabetic chronic kidney disease: Secondary | ICD-10-CM | POA: Diagnosis not present

## 2021-11-03 NOTE — Telephone Encounter (Signed)
Spoke with wife about ordering the sensors for his Medtronic pump.  She says that have told her there at the hospital, that this pump is 1990 s and is out of waranty and the screen is all scratched.  She was told that they recommend he get a new pump.  She says they told her that the Tandem pump is easier to use for him, and he does not have to test his blood sugar, and thinks this would be easier for everyone.    She does not know when they are leaving the hospital, but wants to see you when they get discharged, and wants to start on a new pump ASAP.  She is very afraid to take care of him without a pump stopping the flow of insulin when his blood sugars drop low. Please advise.

## 2021-11-03 NOTE — Telephone Encounter (Signed)
Wife called stating patient blood sugar dropped really low had some jerking going on is currently in hospital. Was put in a medically induced coma, but has since come out. Has severe memory loss. She's wanting to know if patient can be put on a new insulin pump (Tandem) so that his blood sugar are better controlled when he is sent home. Also, he is not scheduled to see you until 3/10 and she would like for him to see you sooner. Patient is not to be sent loser to home for 3-14 days.

## 2021-11-04 DIAGNOSIS — Z992 Dependence on renal dialysis: Secondary | ICD-10-CM | POA: Diagnosis not present

## 2021-11-04 DIAGNOSIS — N186 End stage renal disease: Secondary | ICD-10-CM | POA: Diagnosis not present

## 2021-11-06 ENCOUNTER — Ambulatory Visit: Payer: 59 | Admitting: Cardiovascular Disease

## 2021-11-06 ENCOUNTER — Ambulatory Visit: Payer: 59 | Admitting: Podiatry

## 2021-11-06 DIAGNOSIS — Z4902 Encounter for fitting and adjustment of peritoneal dialysis catheter: Secondary | ICD-10-CM | POA: Diagnosis not present

## 2021-11-09 DIAGNOSIS — N186 End stage renal disease: Secondary | ICD-10-CM | POA: Diagnosis not present

## 2021-11-09 DIAGNOSIS — Z992 Dependence on renal dialysis: Secondary | ICD-10-CM | POA: Diagnosis not present

## 2021-11-11 ENCOUNTER — Other Ambulatory Visit: Payer: Self-pay

## 2021-11-11 DIAGNOSIS — E10649 Type 1 diabetes mellitus with hypoglycemia without coma: Secondary | ICD-10-CM

## 2021-11-11 DIAGNOSIS — N184 Chronic kidney disease, stage 4 (severe): Secondary | ICD-10-CM

## 2021-11-11 NOTE — Patient Outreach (Signed)
Received a referral from Natividad Brood, [facility] Liaison for [name]. I have assigned Matthew Lair, NP to call for follow up and determine if there are any Case Management needs.    [name], [place], [place] Management [phone]

## 2021-11-11 NOTE — Patient Outreach (Signed)
[facility]) Care Management  11/11/2021  [name] 01/24/1971 [id number]    [place] [facility] [[facility]] Patient: [name]  Primary Care Provider:  [name], MD, [facility]  Patient will be  assigned to a [place] Management for telephonic chronic disease management services for post hospital follow up support from [facility].   Spoke with patient via telephone to [phone], HIPAA verified with 2 indicators.  Patient states he is home and has his follow up appointments with Nephrology and Endocrinology.  Awaiting a new insulin pump.  Explained call and for post hospital follow up with [facility] Telephonic RN.  Patient agrees and states wife Lattie Haw can be contacted anytime.       [name]: Patient will be followed by [place] Coordinator, telephonic for post hospital support.   For additional questions or referrals please contact:   Natividad Brood, RN BSN [facility] Liaison  [phone] business mobile phone Toll free office [phone]  Fax number: [phone] [email or web address]

## 2021-11-12 DIAGNOSIS — N186 End stage renal disease: Secondary | ICD-10-CM | POA: Diagnosis not present

## 2021-11-12 DIAGNOSIS — D509 Iron deficiency anemia, unspecified: Secondary | ICD-10-CM | POA: Diagnosis not present

## 2021-11-12 DIAGNOSIS — Z992 Dependence on renal dialysis: Secondary | ICD-10-CM | POA: Diagnosis not present

## 2021-11-13 DIAGNOSIS — N186 End stage renal disease: Secondary | ICD-10-CM | POA: Diagnosis not present

## 2021-11-13 DIAGNOSIS — Z992 Dependence on renal dialysis: Secondary | ICD-10-CM | POA: Diagnosis not present

## 2021-11-14 DIAGNOSIS — N186 End stage renal disease: Secondary | ICD-10-CM | POA: Diagnosis not present

## 2021-11-14 DIAGNOSIS — Z992 Dependence on renal dialysis: Secondary | ICD-10-CM | POA: Diagnosis not present

## 2021-11-15 DIAGNOSIS — N186 End stage renal disease: Secondary | ICD-10-CM | POA: Diagnosis not present

## 2021-11-15 DIAGNOSIS — Z992 Dependence on renal dialysis: Secondary | ICD-10-CM | POA: Diagnosis not present

## 2021-11-16 DIAGNOSIS — Z992 Dependence on renal dialysis: Secondary | ICD-10-CM | POA: Diagnosis not present

## 2021-11-16 DIAGNOSIS — N186 End stage renal disease: Secondary | ICD-10-CM | POA: Diagnosis not present

## 2021-11-16 NOTE — Telephone Encounter (Signed)
Message left that she has heard nothing about the pump. Patient is home and she wants this ASAP Patient was called and told that paperwork was faxed and to contact the pump representative and that he can give her the information she is needing.

## 2021-11-17 DIAGNOSIS — Z992 Dependence on renal dialysis: Secondary | ICD-10-CM | POA: Diagnosis not present

## 2021-11-17 DIAGNOSIS — N186 End stage renal disease: Secondary | ICD-10-CM | POA: Diagnosis not present

## 2021-11-18 ENCOUNTER — Other Ambulatory Visit: Payer: Self-pay | Admitting: *Deleted

## 2021-11-18 DIAGNOSIS — N186 End stage renal disease: Secondary | ICD-10-CM | POA: Diagnosis not present

## 2021-11-18 DIAGNOSIS — Z992 Dependence on renal dialysis: Secondary | ICD-10-CM | POA: Diagnosis not present

## 2021-11-18 NOTE — Patient Outreach (Signed)
[facility]) Care Management  11/18/2021  MARQUINN MESCHKE [birth year] [id number]  Initial successful outreach for [facility] Referral. Mr. Masoner gave permission for his intake and later revealed he is involved with [facility] already. Advised that he should call his care manager and let them know he is home and what his medical plan of care is for their information and follow up.  Thanked he and his wife for their time. Sent contact information and will send a successful letter for future reference.  Eulah Pont. Myrtie Neither, MSN, [facility] Gerontological Nurse Practitioner [facility] Care Management [phone]

## 2021-11-19 ENCOUNTER — Other Ambulatory Visit: Payer: Self-pay

## 2021-11-19 ENCOUNTER — Encounter: Payer: Self-pay | Admitting: Endocrinology

## 2021-11-19 ENCOUNTER — Ambulatory Visit: Payer: 59 | Admitting: Endocrinology

## 2021-11-19 ENCOUNTER — Other Ambulatory Visit (HOSPITAL_COMMUNITY): Payer: Self-pay

## 2021-11-19 VITALS — BP 158/72 | HR 68 | Ht 71.0 in | Wt 239.0 lb

## 2021-11-19 DIAGNOSIS — E1065 Type 1 diabetes mellitus with hyperglycemia: Secondary | ICD-10-CM

## 2021-11-19 DIAGNOSIS — N186 End stage renal disease: Secondary | ICD-10-CM | POA: Diagnosis not present

## 2021-11-19 DIAGNOSIS — Z992 Dependence on renal dialysis: Secondary | ICD-10-CM | POA: Diagnosis not present

## 2021-11-19 DIAGNOSIS — R110 Nausea: Secondary | ICD-10-CM

## 2021-11-19 MED ORDER — ONDANSETRON HCL 4 MG PO TABS
4.0000 mg | ORAL_TABLET | Freq: Three times a day (TID) | ORAL | 0 refills | Status: DC | PRN
  Filled 2021-11-19: qty 20, 7d supply, fill #0

## 2021-11-19 MED ORDER — FREESTYLE LIBRE 3 SENSOR MISC
1.0000 | 2 refills | Status: DC
  Filled 2021-11-19: qty 2, 28d supply, fill #0
  Filled 2021-12-11: qty 2, 28d supply, fill #1

## 2021-11-19 NOTE — Patient Instructions (Signed)
Ask Renal about REGLAN

## 2021-11-19 NOTE — Progress Notes (Signed)
Patient ID: [name], male   DOB: 03/13/1971, 51 y.o.   MRN: [id number]           Reason for Appointment : for Type 1 Diabetes  History of Present Illness   Referring HCP: Dr. Holley Raring         Diagnosis: Type 1 diabetes mellitus, date of diagnosis:  1997        Previous history:  He has been variously on insulin injections and insulin pumps since his diagnosis He has been on insulin pump on and off for the last few years with fair control  Recent history:   INSULIN pump settings with the MINIMED 670 G pump:  Active basal rate #1: 12 AM = 0.3, 3 AM = 0.9, from 6 AM = 1.15, 10 AM = 1.50 and 8 PM = 1.6  Basal rate #2: 12 AM = 1.7, 8 AM = 1.4 and 6 PM = 1.8  Carbohydrate coverage 1: 7 at breakfast and suppertime and 1: 8 at lunch  Sensitivity 1: 30 except 1: 20 between 8 PM-8 AM Current target 120 and active insulin 3 hours  A1c is last 9.2  Basal rate at 6 AM = 1.15 and 10 AM = 1.50  Current management, blood sugar patterns and problems identified:   He is coming back for short-term follow-up because of episode of severe hypoglycemia requiring hospital admission last month As before he does not like to use the guardian sensor especially because of requiring calibration Previously was having some low sugars early part of the night He was taken off the pump at time of discharge and he only started using this about 4 days ago He did not bring his monitor for download and not clear what his exact blood sugar patterns are Also not using freestyle libre that he had used before also  He appears to be suspending his pump at least a couple of times a day but anything this is when he is having blood sugars near 70 although his wife is saying that he is suspending it because he is having more nausea  Appears to be having mostly HYPERGLYCEMIA on waking up  Has high sugars also periodically late afternoon but also appears to be suspending his pump around midday for 2 to 5 hours   Not clear what his postprandial readings are and he has only rarely entered carbohydrates for his boluses Yesterday he had a blood sugar of over 400 after suspending his pump midday and after 6.7 units additional bolus late afternoon as blood sugar probably was lower causing him to suspend the pump He is usually starting his peritoneal dialysis late evening He has been approved for the tandem pump but not clear when he will be able to get this  Still has not gone back to work  GLUCOSE data: Average of glucose in the pump in the last few days = 295 Details as above  PREVIOUS Sensor data:  CGM use % of time 92  2-week average/GV 175+/-68   %  Time in range     58  % Time Above 180 26  % Time above 250 15  % Time Below 70 1     PRE-MEAL Fasting Lunch Dinner Bedtime Overall  Glucose range:       Averages: 130   206     Self-care: Typical breakfast will be toast, eggs and bacon Usually lunch will be chicken, pasta and green beans Typical dinner: Steak asparagus and corn Snacks  peanut butter crackers and chips   Mealtimes are: Dinner: 6-9 PM                Dietician consultation: Most recent: none.         CDE consultation: Years ago  Diabetes labs:  Lab Results  Component Value Date   HGBA1C 9.2 (A) 10/02/2021   HGBA1C 8.6 (H) 02/10/2021   HGBA1C 8.1 (H) 01/22/2021   Lab Results  Component Value Date   MICROALBUR 93.4 (H) 02/10/2021   LDLCALC 95 02/10/2021   CREATININE 14.09 (H) 10/13/2021    Lab Results  Component Value Date   MICRALBCREAT 156.1 (H) 02/10/2021     Allergies as of 11/19/2021       Reactions   Sulfa Antibiotics Anaphylaxis, Swelling   Pt is not allergic to iodine, has had iodine in the past w/o premeds and w/ no problems   Oxycontin [oxycodone Hcl] Nausea And Vomiting   Penicillins Other (See Comments)   Possible reaction many years ago per patient (Pt reports he has had pcn without issues since time of "reaction") Other reaction(s):  Unknown   Prednisone Other (See Comments)   Patient is diabetic, runs sugar up   Shellfish-derived Products Swelling   Eyes swell with shellfish Pt is not allergic to iodine, has had iodine in the past w/o premeds and w/ no problem        Medication List        Accurate as of November 19, 2021  2:59 PM. If you have any questions, ask your nurse or doctor.          acetaminophen 500 MG tablet Commonly known as: TYLENOL Take 500-1,000 mg by mouth every 6 (six) hours as needed for mild pain or fever.   amLODipine 10 MG tablet Commonly known as: NORVASC Take by mouth. What changed: Another medication with the same name was changed. Make sure you understand how and when to take each.   amLODipine 10 MG tablet Commonly known as: NORVASC TAKE 1 TABLET BY MOUTH ONCE DAILY What changed: how much to take   aspirin EC 81 MG tablet Take 81 mg by mouth daily. Swallow whole.   atorvastatin 80 MG tablet Commonly known as: LIPITOR TAKE 1 TABLET BY MOUTH ONCE DAILY What changed: how much to take   Auryxia 1 GM 210 MG(Fe) tablet Generic drug: ferric citrate Take 3 tablets by mouth 3 times a day with a meal   carvedilol 25 MG tablet Commonly known as: COREG take one tablet by mouth 2 times daily with meals   cinacalcet 60 MG tablet Commonly known as: SENSIPAR Take 1 tablet (60 mg total) by mouth daily.   cloNIDine 0.1 MG tablet Commonly known as: CATAPRES Take 0.1 mg by mouth 2 (two) times daily.   gentamicin cream 0.1 % Commonly known as: GARAMYCIN Apply pea size amount to exit size daily. What changed:  how much to take when to take this reasons to take this   Glucagon Emergency 1 MG Kit Inject into the muscle as directed for severe sugar.   HumaLOG 100 UNIT/ML injection Generic drug: insulin lispro Use up to 100 units total via insulin pump daily   losartan 100 MG tablet Commonly known as: COZAAR TAKE 1 TABLET BY MOUTH ONCE DAILY   oxyCODONE-acetaminophen  5-325 MG tablet Commonly known as: Percocet Take 1 tablet by mouth every 6 hours   pregabalin 75 MG capsule Commonly known as: Lyrica Take 1 capsule (75 mg total)  by mouth daily.   torsemide 100 MG tablet Commonly known as: DEMADEX Take 1 tablet by mouth once a day   Velphoro 500 MG chewable tablet Generic drug: sucroferric oxyhydroxide Chew 2 tablets by mouth 3 times a day with food        Allergies:  Allergies  Allergen Reactions   Sulfa Antibiotics Anaphylaxis and Swelling    Pt is not allergic to iodine, has had iodine in the past w/o premeds and w/ no problems   Oxycontin [Oxycodone Hcl] Nausea And Vomiting   Penicillins Other (See Comments)    Possible reaction many years ago per patient (Pt reports he has had pcn without issues since time of "reaction") Other reaction(s): Unknown   Prednisone Other (See Comments)    Patient is diabetic, runs sugar up     Shellfish-Derived Products Swelling    Eyes swell with shellfish Pt is not allergic to iodine, has had iodine in the past w/o premeds and w/ no problem    Past Medical History:  Diagnosis Date   ESRD (end stage renal disease) ([place])    Gastroparesis    Herniated cervical disc    HTN (hypertension)    Hypercholesteremia    Morbid obesity (HCC)    OSA (obstructive sleep apnea)    Peritoneal dialysis status (HCC)    PONV (postoperative nausea and vomiting)    after peritoneal dialysis catheter was placed   S/P cardiac cath    a. 10-15 yrs ago at [facility] due to tachycardia, reportedly normal. b. Normal ETT 03/2012.   Sinus tachycardia    a. 24-hr Holter 03/2012 - SR, occ PVCs, no VT, avg HR 96bpm.   TIA (transient ischemic attack)    Type 1 diabetes mellitus ([place])    a. With insulin pump.    Past Surgical History:  Procedure Laterality Date   BIOPSY  10/03/2019   Procedure: BIOPSY;  Surgeon: Mauri Pole, MD;  Location: [facility];  Service: Endoscopy;;   cervical neck fusion     COLONOSCOPY WITH  PROPOFOL N/A 01/02/2021   Procedure: COLONOSCOPY WITH PROPOFOL;  Surgeon: [name], MD;  Location: [place];  Service: Endoscopy;  Laterality: N/A;  priority 4 DIABETIC needs potassium draw   ESOPHAGOGASTRODUODENOSCOPY (EGD) WITH PROPOFOL N/A 10/03/2019   Procedure: ESOPHAGOGASTRODUODENOSCOPY (EGD) WITH PROPOFOL;  Surgeon: Mauri Pole, MD;  Location: [facility];  Service: Endoscopy;  Laterality: N/A;   HERNIA REPAIR     bilateral   LEFT HEART CATH AND CORONARY ANGIOGRAPHY Left 10/16/2021   Procedure: LEFT HEART CATH AND CORONARY ANGIOGRAPHY;  Surgeon: Wellington Hampshire, MD;  Location: [facility];  Service: Cardiovascular;  Laterality: Left;   LEFT HEART CATHETERIZATION WITH CORONARY ANGIOGRAM N/A 12/18/2013   Procedure: LEFT HEART CATHETERIZATION WITH CORONARY ANGIOGRAM;  Surgeon: [name] M Martinique, MD;  Location: [facility];  Service: Cardiovascular;  Laterality: N/A;   POLYPECTOMY  01/02/2021   Procedure: POLYPECTOMY;  Surgeon: [name], MD;  Location: [facility];  Service: Endoscopy;;    Family History  Problem Relation Age of Onset   Hypertension Other    Coronary artery disease Other        Mother's side - both her side's grandparents died of heart disease (MIs)   Diabetes Other    Stroke Other        Paternal grandfather (82)   Prostate cancer Neg Hx    Bladder Cancer Neg Hx    Kidney cancer Neg Hx  Social History:  reports that he has never smoked. He has never used smokeless tobacco. He reports that he does not drink alcohol and does not use drugs.      Review of Systems      Lipids: Has been treated with 80 mg atorvastatin, last labs as follows  Lab Results  Component Value Date   CHOL 178 02/10/2021   HDL 53.40 02/10/2021   LDLCALC 95 02/10/2021   TRIG 145.0 02/10/2021   CHOLHDL 3 02/10/2021    Last dilated eye exam was in 10/25  DIABETES COMPLICATIONS: Nephropathy, diabetic foot ulcers  NEUROPATHY, peripheral:  Periodically will have a lot of painful sensations in his legs from neuropathy with tingling and sharp pains.   However gabapentin that was given makes him feel bad and he is not taking this Symptoms are intermittent  He was given a trial of Lyrica on his last visit  NAUSEA: He has had persistent nausea for about 3 months, sometimes waking up with nausea Overall appetite is somewhat decreased recently He has discussed with his nephrologist but no treatment offered  Hypertension: On multiple drugs including Norvasc, losartan and clonidine followed by nephrology   Physical Examination:  BP (!) 158/72    Pulse 68    Ht _0  (1.803 m)    Wt 239 lb (108.4 kg)    SpO2 97%    BMI 33.33 kg/m    ASSESSMENT:  Diabetes type 1, poorly controlled  A1c is last 9.2  He is using the Medtronic 670 pump without the sensor   Problems identified: He has very labile control and periodic episodes of severe hypoglycemia including about 3 weeks ago requiring hospitalization  He has previously been reluctant to use the Medtronic sensors and apparently this is because he does not want his check his blood sugars for calibration  However recently has been using his fingerstick blood sugars several times a day, unable to review his details as he did not bring his meter  Likely has high readings after his dialysis finishes in the morning and may be tending to have lower readings early afternoon and late evening from the data available on the last 4 or 5 days of history from his pump download  Hypertension: Not adequately controlled, he will follow-up with his nephrologist  GASTROPARESIS: 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 with persistent nausea  PLAN:   He will let us know when he has the tandem pump available to start training  In the meantime he will be starting the freestyle libre version 3 to help him monitor more closely New prescription sent as he requested to start on this now instead of the  previous supplies of [place] 2 He can set his low alert at 20 Make sure he enters his blood sugars and carbohydrates at each meal to help bolus wizard calculation Also avoid stacking boluses Call if having any low blood sugars Avoid suspending the pump for excessive periods of time  Basal rate changes: 3 AM = 1.1, 6 AM = 1.25, also add another basal rate of 1.3 between 12 PM-3 PM Also decrease basal rate at 8 PM down to 1.3 Continue same target of 140 Correction factor I: 50 during the day and 1: 35 overnight Carbohydrate ratio 1:10 at all times for now  He will check with his nephrologist to see if Reglan can be used with his peritoneal dialysis otherwise just use Zofran as needed which will be prescribed today  There are no Patient Instructions  on file for this visit.   Total visit time for evaluation and management and counseling = 30 minutes  Elayne Snare 11/19/2021, 2:59 PM   Total visit time including counseling = 30 minutes   Note: This note was prepared with Dragon voice recognition system technology. Any transcriptional errors that result from this process are unintentional.

## 2021-11-20 DIAGNOSIS — N186 End stage renal disease: Secondary | ICD-10-CM | POA: Diagnosis not present

## 2021-11-20 DIAGNOSIS — Z992 Dependence on renal dialysis: Secondary | ICD-10-CM | POA: Diagnosis not present

## 2021-11-21 DIAGNOSIS — N186 End stage renal disease: Secondary | ICD-10-CM | POA: Diagnosis not present

## 2021-11-21 DIAGNOSIS — Z992 Dependence on renal dialysis: Secondary | ICD-10-CM | POA: Diagnosis not present

## 2021-11-22 DIAGNOSIS — Z992 Dependence on renal dialysis: Secondary | ICD-10-CM | POA: Diagnosis not present

## 2021-11-22 DIAGNOSIS — N186 End stage renal disease: Secondary | ICD-10-CM | POA: Diagnosis not present

## 2021-11-23 DIAGNOSIS — Z992 Dependence on renal dialysis: Secondary | ICD-10-CM | POA: Diagnosis not present

## 2021-11-23 DIAGNOSIS — N186 End stage renal disease: Secondary | ICD-10-CM | POA: Diagnosis not present

## 2021-11-24 DIAGNOSIS — Z992 Dependence on renal dialysis: Secondary | ICD-10-CM | POA: Diagnosis not present

## 2021-11-24 DIAGNOSIS — N186 End stage renal disease: Secondary | ICD-10-CM | POA: Diagnosis not present

## 2021-11-25 DIAGNOSIS — N186 End stage renal disease: Secondary | ICD-10-CM | POA: Diagnosis not present

## 2021-11-25 DIAGNOSIS — Z992 Dependence on renal dialysis: Secondary | ICD-10-CM | POA: Diagnosis not present

## 2021-11-26 DIAGNOSIS — Z992 Dependence on renal dialysis: Secondary | ICD-10-CM | POA: Diagnosis not present

## 2021-11-26 DIAGNOSIS — N186 End stage renal disease: Secondary | ICD-10-CM | POA: Diagnosis not present

## 2021-11-27 DIAGNOSIS — N186 End stage renal disease: Secondary | ICD-10-CM | POA: Diagnosis not present

## 2021-11-27 DIAGNOSIS — Z992 Dependence on renal dialysis: Secondary | ICD-10-CM | POA: Diagnosis not present

## 2021-11-28 DIAGNOSIS — Z992 Dependence on renal dialysis: Secondary | ICD-10-CM | POA: Diagnosis not present

## 2021-11-28 DIAGNOSIS — N186 End stage renal disease: Secondary | ICD-10-CM | POA: Diagnosis not present

## 2021-11-29 DIAGNOSIS — Z992 Dependence on renal dialysis: Secondary | ICD-10-CM | POA: Diagnosis not present

## 2021-11-29 DIAGNOSIS — N186 End stage renal disease: Secondary | ICD-10-CM | POA: Diagnosis not present

## 2021-11-30 DIAGNOSIS — Z992 Dependence on renal dialysis: Secondary | ICD-10-CM | POA: Diagnosis not present

## 2021-11-30 DIAGNOSIS — N186 End stage renal disease: Secondary | ICD-10-CM | POA: Diagnosis not present

## 2021-12-01 DIAGNOSIS — Z992 Dependence on renal dialysis: Secondary | ICD-10-CM | POA: Diagnosis not present

## 2021-12-01 DIAGNOSIS — N186 End stage renal disease: Secondary | ICD-10-CM | POA: Diagnosis not present

## 2021-12-02 DIAGNOSIS — N186 End stage renal disease: Secondary | ICD-10-CM | POA: Diagnosis not present

## 2021-12-02 DIAGNOSIS — Z992 Dependence on renal dialysis: Secondary | ICD-10-CM | POA: Diagnosis not present

## 2021-12-03 DIAGNOSIS — N186 End stage renal disease: Secondary | ICD-10-CM | POA: Diagnosis not present

## 2021-12-03 DIAGNOSIS — Z992 Dependence on renal dialysis: Secondary | ICD-10-CM | POA: Diagnosis not present

## 2021-12-04 DIAGNOSIS — Z992 Dependence on renal dialysis: Secondary | ICD-10-CM | POA: Diagnosis not present

## 2021-12-04 DIAGNOSIS — N186 End stage renal disease: Secondary | ICD-10-CM | POA: Diagnosis not present

## 2021-12-05 DIAGNOSIS — Z992 Dependence on renal dialysis: Secondary | ICD-10-CM | POA: Diagnosis not present

## 2021-12-05 DIAGNOSIS — N186 End stage renal disease: Secondary | ICD-10-CM | POA: Diagnosis not present

## 2021-12-06 DIAGNOSIS — N186 End stage renal disease: Secondary | ICD-10-CM | POA: Diagnosis not present

## 2021-12-06 DIAGNOSIS — Z992 Dependence on renal dialysis: Secondary | ICD-10-CM | POA: Diagnosis not present

## 2021-12-07 DIAGNOSIS — Z992 Dependence on renal dialysis: Secondary | ICD-10-CM | POA: Diagnosis not present

## 2021-12-07 DIAGNOSIS — N186 End stage renal disease: Secondary | ICD-10-CM | POA: Diagnosis not present

## 2021-12-08 DIAGNOSIS — E1065 Type 1 diabetes mellitus with hyperglycemia: Secondary | ICD-10-CM | POA: Diagnosis not present

## 2021-12-08 DIAGNOSIS — Z992 Dependence on renal dialysis: Secondary | ICD-10-CM | POA: Diagnosis not present

## 2021-12-08 DIAGNOSIS — N186 End stage renal disease: Secondary | ICD-10-CM | POA: Diagnosis not present

## 2021-12-09 DIAGNOSIS — N186 End stage renal disease: Secondary | ICD-10-CM | POA: Diagnosis not present

## 2021-12-09 DIAGNOSIS — Z992 Dependence on renal dialysis: Secondary | ICD-10-CM | POA: Diagnosis not present

## 2021-12-10 DIAGNOSIS — N186 End stage renal disease: Secondary | ICD-10-CM | POA: Diagnosis not present

## 2021-12-10 DIAGNOSIS — Z23 Encounter for immunization: Secondary | ICD-10-CM | POA: Diagnosis not present

## 2021-12-10 DIAGNOSIS — Z992 Dependence on renal dialysis: Secondary | ICD-10-CM | POA: Diagnosis not present

## 2021-12-11 ENCOUNTER — Other Ambulatory Visit: Payer: Self-pay

## 2021-12-11 ENCOUNTER — Telehealth: Payer: Self-pay

## 2021-12-11 ENCOUNTER — Encounter: Payer: Self-pay | Admitting: Endocrinology

## 2021-12-11 ENCOUNTER — Other Ambulatory Visit (HOSPITAL_COMMUNITY): Payer: Self-pay

## 2021-12-11 ENCOUNTER — Ambulatory Visit: Payer: 59 | Admitting: Podiatry

## 2021-12-11 ENCOUNTER — Encounter: Payer: Self-pay | Admitting: Podiatry

## 2021-12-11 ENCOUNTER — Other Ambulatory Visit: Payer: Self-pay | Admitting: Endocrinology

## 2021-12-11 ENCOUNTER — Ambulatory Visit: Payer: 59 | Admitting: Endocrinology

## 2021-12-11 VITALS — BP 154/90 | HR 86 | Ht 71.0 in | Wt 245.4 lb

## 2021-12-11 DIAGNOSIS — E0843 Diabetes mellitus due to underlying condition with diabetic autonomic (poly)neuropathy: Secondary | ICD-10-CM | POA: Diagnosis not present

## 2021-12-11 DIAGNOSIS — I10 Essential (primary) hypertension: Secondary | ICD-10-CM | POA: Diagnosis not present

## 2021-12-11 DIAGNOSIS — E1065 Type 1 diabetes mellitus with hyperglycemia: Secondary | ICD-10-CM

## 2021-12-11 DIAGNOSIS — N186 End stage renal disease: Secondary | ICD-10-CM | POA: Diagnosis not present

## 2021-12-11 DIAGNOSIS — S90421A Blister (nonthermal), right great toe, initial encounter: Secondary | ICD-10-CM | POA: Diagnosis not present

## 2021-12-11 DIAGNOSIS — L97522 Non-pressure chronic ulcer of other part of left foot with fat layer exposed: Secondary | ICD-10-CM

## 2021-12-11 DIAGNOSIS — Z992 Dependence on renal dialysis: Secondary | ICD-10-CM | POA: Diagnosis not present

## 2021-12-11 MED ORDER — INSULIN LISPRO 100 UNIT/ML IJ SOLN
INTRAMUSCULAR | 2 refills | Status: DC
  Filled 2021-12-11: qty 90, 90d supply, fill #0
  Filled 2022-03-12: qty 90, 90d supply, fill #1

## 2021-12-11 MED ORDER — AMLODIPINE BESYLATE 10 MG PO TABS
ORAL_TABLET | Freq: Every day | ORAL | 11 refills | Status: DC
  Filled 2021-12-11: qty 30, 30d supply, fill #0
  Filled 2021-12-30: qty 90, 90d supply, fill #1
  Filled 2022-08-02: qty 90, 90d supply, fill #0
  Filled 2022-09-09 – 2022-11-01 (×2): qty 90, 90d supply, fill #1

## 2021-12-11 MED ORDER — SILVER SULFADIAZINE 1 % EX CREA
1.0000 "application " | TOPICAL_CREAM | Freq: Every day | CUTANEOUS | 1 refills | Status: DC
  Filled 2021-12-11: qty 50, 20d supply, fill #0

## 2021-12-11 NOTE — Progress Notes (Signed)
Patient ID: [name], male   DOB: 1970-11-29, 51 y.o.   MRN: [id number]           Reason for Appointment : for Type 1 Diabetes  History of Present Illness           Diagnosis: Type 1 diabetes mellitus, date of diagnosis:  1997        Previous history:  He has been variously on insulin injections and insulin pumps since his diagnosis He has been on insulin pump on and off for the last few years with fair control  Recent history:   INSULIN pump settings with the MINIMED 670 G pump:  Active basal rate #1: 12 AM = 0.3, 3 AM = 1.0, from 6 AM = 1.25, 10 AM = 1.50 and 8 PM = 1.3  Basal rate #2: 12 AM = 1.7, 8 AM = 1.4 and 6 PM = 1.8  Carbohydrate coverage 1: 7 at breakfast and suppertime and 1: 8 at lunch  Sensitivity 1: 40 at midnight, 25 at 8 AM and 30 at 8 PM   Current target 140 and active insulin 3 hours  A1c is last 9.2   Current management, blood sugar patterns and problems identified:   He is still waiting for the T-slim pump but since his last visit has been able to use the libre 3 sensor  His basal rates were changed on the last visit but data on his overnight blood sugars was not available in detail at that time  Previously had episode of severe hypoglycemia overnight However more recently has not had any overnight low sugars  Current blood sugar patterns from analysis of his freestyle Elenor Legato is as follows:   MARKED variability in blood sugars at all times and has significant HYPERGLYCEMIC episodes mostly overnight but periodically late afternoon and evening also  However in the last week his blood sugars are not is consistently high overnight and mostly tend to be high after about 7-9 PM and coming down early morning  HYPOGLYCEMIA has occurred only once in the last week but previously had occasional episodes midday and afternoon or early evening  POSTPRANDIAL readings are usually well controlled except at times higher after dinner or lunch HYPERGLYCEMIA also  may follow an episode of hypoglycemia  As above overnight blood sugars are mostly significantly high but variable and only once near normal.  HIGHEST average blood sugar 233 from 4-6 AM   Bolus history: He is appearing to take mostly correction boluses throughout the day and night when the blood sugars are high with variable results and generally has an adequate correction  Bolus entry for carbohydrates has been only very infrequent and not clear if he is bolusing consistently before eating dinner especially recently  Also his pump was suspended for a few hours on Tuesday afternoon even when the blood sugar was normal    CGM data: GMI 7.8  CGM use % of time 96  2-week average/GV 189  Time in range       46 %  % Time Above 180 28  % Time above 250 23  % Time Below 70 3     PRE-MEAL Fasting Lunch Dinner Bedtime Overall  Glucose range:       Averages: 170  180 185    POST-MEAL PC Breakfast PC Lunch PC Dinner  Glucose range:     Averages: ? ? 187      PREVIOUS Sensor data:  CGM use % of time 92  2-week average/GV 175+/-68   %  Time in range     58  % Time Above 180 26  % Time above 250 15  % Time Below 70 1     PRE-MEAL Fasting Lunch Dinner Bedtime Overall  Glucose range:       Averages: 130   206     Self-care: Typical breakfast will be toast, eggs and bacon Usually lunch will be chicken, pasta and green beans Typical dinner: Steak asparagus and corn Snacks peanut butter crackers and chips                 Dietician consultation: Most recent: none.         CDE consultation: Years ago  Diabetes labs:  Lab Results  Component Value Date   HGBA1C 9.2 (A) 10/02/2021   HGBA1C 8.6 (H) 02/10/2021   HGBA1C 8.1 (H) 01/22/2021   Lab Results  Component Value Date   MICROALBUR 93.4 (H) 02/10/2021   LDLCALC 95 02/10/2021   CREATININE 14.09 (H) 10/13/2021    Lab Results  Component Value Date   MICRALBCREAT 156.1 (H) 02/10/2021     Allergies as of 12/11/2021        Reactions   Sulfa Antibiotics Anaphylaxis, Swelling   Pt is not allergic to iodine, has had iodine in the past w/o premeds and w/ no problems   Oxycontin [oxycodone Hcl] Nausea And Vomiting   Penicillins Other (See Comments)   Possible reaction many years ago per patient (Pt reports he has had pcn without issues since time of "reaction") Other reaction(s): Unknown   Prednisone Other (See Comments)   Patient is diabetic, runs sugar up   Shellfish-derived Products Swelling   Eyes swell with shellfish Pt is not allergic to iodine, has had iodine in the past w/o premeds and w/ no problem   Pregabalin Other (See Comments)   Muscle twitching and jerks        Medication List        Accurate as of December 11, 2021 11:59 PM. If you have any questions, ask your nurse or doctor.          acetaminophen 500 MG tablet Commonly known as: TYLENOL Take 500-1,000 mg by mouth every 6 (six) hours as needed for mild pain or fever.   amLODipine 10 MG tablet Commonly known as: NORVASC Take by mouth. What changed: Another medication with the same name was changed. Make sure you understand how and when to take each.   amLODipine 10 MG tablet Commonly known as: NORVASC TAKE 1 TABLET BY MOUTH ONCE DAILY What changed: how much to take   aspirin EC 81 MG tablet Take 81 mg by mouth daily. Swallow whole.   atorvastatin 80 MG tablet Commonly known as: LIPITOR TAKE 1 TABLET BY MOUTH ONCE DAILY What changed: how much to take   Auryxia 1 GM 210 MG(Fe) tablet Generic drug: ferric citrate Take 3 tablets by mouth 3 times a day with a meal   carvedilol 25 MG tablet Commonly known as: COREG take one tablet by mouth 2 times daily with meals   cinacalcet 60 MG tablet Commonly known as: SENSIPAR Take 1 tablet (60 mg total) by mouth daily.   cloNIDine 0.1 MG tablet Commonly known as: CATAPRES Take 0.1 mg by mouth 2 (two) times daily.   FreeStyle Libre 3 Sensor Misc Apply 1 sensor on upper  arm every 14 days for continuous glucose monitoring   gentamicin cream 0.1 % Commonly known as:  GARAMYCIN Apply pea size amount to exit size daily. What changed:  how much to take when to take this reasons to take this   Glucagon Emergency 1 MG Kit Inject into the muscle as directed for severe sugar.   insulin lispro 100 UNIT/ML injection Commonly known as: HumaLOG Use up to 100 units total via insulin pump daily   losartan 100 MG tablet Commonly known as: COZAAR TAKE 1 TABLET BY MOUTH ONCE DAILY   ondansetron 4 MG tablet Commonly known as: Zofran Take 1 tablet by mouth every 8 hours as needed for nausea or vomiting.   oxyCODONE-acetaminophen 5-325 MG tablet Commonly known as: Percocet Take 1 tablet by mouth every 6 hours   pregabalin 75 MG capsule Commonly known as: Lyrica Take 1 capsule (75 mg total) by mouth daily.   Silvadene 1 % cream Generic drug: silver sulfADIAZINE Apply to affected area once daily Started by: Edrick Kins, DPM   torsemide 100 MG tablet Commonly known as: DEMADEX Take 1 tablet by mouth once a day   Velphoro 500 MG chewable tablet Generic drug: sucroferric oxyhydroxide Chew 2 tablets by mouth 3 times a day with food        Allergies:  Allergies  Allergen Reactions   Sulfa Antibiotics Anaphylaxis and Swelling    Pt is not allergic to iodine, has had iodine in the past w/o premeds and w/ no problems   Oxycontin [Oxycodone Hcl] Nausea And Vomiting   Penicillins Other (See Comments)    Possible reaction many years ago per patient (Pt reports he has had pcn without issues since time of "reaction") Other reaction(s): Unknown   Prednisone Other (See Comments)    Patient is diabetic, runs sugar up     Shellfish-Derived Products Swelling    Eyes swell with shellfish Pt is not allergic to iodine, has had iodine in the past w/o premeds and w/ no problem   Pregabalin Other (See Comments)    Muscle twitching and jerks    Past Medical  History:  Diagnosis Date   ESRD (end stage renal disease) ([place])    Gastroparesis    Herniated cervical disc    HTN (hypertension)    Hypercholesteremia    Morbid obesity (HCC)    OSA (obstructive sleep apnea)    Peritoneal dialysis status (HCC)    PONV (postoperative nausea and vomiting)    after peritoneal dialysis catheter was placed   S/P cardiac cath    a. 10-15 yrs ago at [facility] due to tachycardia, reportedly normal. b. Normal ETT 03/2012.   Sinus tachycardia    a. 24-hr Holter 03/2012 - SR, occ PVCs, no VT, avg HR 96bpm.   TIA (transient ischemic attack)    Type 1 diabetes mellitus ([place])    a. With insulin pump.    Past Surgical History:  Procedure Laterality Date   BIOPSY  10/03/2019   Procedure: BIOPSY;  Surgeon: Mauri Pole, MD;  Location: [facility];  Service: Endoscopy;;   cervical neck fusion     COLONOSCOPY WITH PROPOFOL N/A 01/02/2021   Procedure: COLONOSCOPY WITH PROPOFOL;  Surgeon: [name], MD;  Location: [facility];  Service: Endoscopy;  Laterality: N/A;  priority 4 DIABETIC needs potassium draw   ESOPHAGOGASTRODUODENOSCOPY (EGD) WITH PROPOFOL N/A 10/03/2019   Procedure: ESOPHAGOGASTRODUODENOSCOPY (EGD) WITH PROPOFOL;  Surgeon: Mauri Pole, MD;  Location: [facility];  Service: Endoscopy;  Laterality: N/A;   HERNIA REPAIR     bilateral   LEFT HEART CATH AND  CORONARY ANGIOGRAPHY Left 10/16/2021   Procedure: LEFT HEART CATH AND CORONARY ANGIOGRAPHY;  Surgeon: Wellington Hampshire, MD;  Location: [facility];  Service: Cardiovascular;  Laterality: Left;   LEFT HEART CATHETERIZATION WITH CORONARY ANGIOGRAM N/A 12/18/2013   Procedure: LEFT HEART CATHETERIZATION WITH CORONARY ANGIOGRAM;  Surgeon: [name] M Martinique, MD;  Location: [facility] CATH [facility];  Service: Cardiovascular;  Laterality: N/A;   POLYPECTOMY  01/02/2021   Procedure: POLYPECTOMY;  Surgeon: [name], MD;  Location: [facility];  Service: Endoscopy;;    Family History   Problem Relation Age of Onset   Hypertension Other    Coronary artery disease Other        Mother's side - both her side's grandparents died of heart disease (MIs)   Diabetes Other    Stroke Other        Paternal grandfather (82)   Prostate cancer Neg Hx    Bladder Cancer Neg Hx    Kidney cancer Neg Hx     Social History:  reports that he has never smoked. He has never used smokeless tobacco. He reports that he does not drink alcohol and does not use drugs.      Review of Systems      Lipids: Has been treated with 80 mg atorvastatin, last labs as follows  Lab Results  Component Value Date   CHOL 178 02/10/2021   HDL 53.40 02/10/2021   LDLCALC 95 02/10/2021   TRIG 145.0 02/10/2021   CHOLHDL 3 02/10/2021    Last dilated eye exam was in 85/92  DIABETES COMPLICATIONS: Nephropathy, diabetic foot ulcers  NEUROPATHY, peripheral: Periodically will have a lot of painful sensations in his legs from neuropathy with tingling and sharp pains.   However gabapentin that was given makes him feel bad and he is not taking this Symptoms are intermittent  Has taken Lyrica  NAUSEA: He has had persistent nausea for about 3 months, sometimes waking up with nausea Overall appetite is somewhat decreased  Hypertension: On multiple drugs including Norvasc, losartan and clonidine followed by nephrology   Physical Examination:  BP (!) 154/90    Pulse 86    Ht 5' 11"  (1.803 m)    Wt 245 lb 6.4 oz (111.3 kg)    SpO2 95%    BMI 34.23 kg/m    ASSESSMENT:  Diabetes type 1, poorly controlled  A1c is last 9.2  He is using the Medtronic 670 pump  Currently using libre 3 sensor  Problems identified: He has continued labile blood sugars at all times  HYPERGLYCEMIA is mostly related to his peritoneal dialysis overnight with blood sugars generally rising late evening and peaking around 4 AM  HYPOGLYCEMIA has been infrequent but may occur occasionally midday and late afternoon with low normal  sugars surrounding 6-9 AM  He has likely inadequate basal as above but does not appear to be getting adequate correction from current boluses, not clear if he is bolusing for meals or high sugars in the afternoons and evenings  Not entering carbohydrates when he is eating a meal  Unclear whether he is bolusing before starting to eat  Occasionally suspending the pump for a few hours which may cause hyperglycemia later  Hypertension: Not adequately controlled  PLAN:   He will let us know when he has the tandem pump available to start training In the meantime he will download the T-connect app on his phone Continue to use the freestyle libre and check on the availability of the  Dexcom when his pump is received  Avoid suspending the pump for excessive periods of time  Basal rate settings now: 12 AM-2 AM = 0.5, 2 AM-4 AM = 1.35, 4 AM-6 AM = 1.0, 6 AM-10 AM = 1.0 and 10 AM-7 PM = 1.4, 7 PM +1.5    Carbohydrate ratio unchanged at 1: 7 at midnight and 5 PM otherwise 8.0 from 12-5 PM  There are no Patient Instructions on file for this visit.   Total visit time for evaluation and management and counseling = 30 minutes  Elayne Snare 12/12/2021, 9:42 AM     Note: This note was prepared with Dragon voice recognition system technology. Any transcriptional errors that result from this process are unintentional.

## 2021-12-11 NOTE — Progress Notes (Signed)
? ?  Subjective:  ?Patient presents today for a new complaint today regarding blisters that developed to the toes of the right foot.  Patient states that he was burning weeds with a torch and could not feel his toes due to the neuropathy.  That same day as well as the following day he noticed blisters developing to his toes.  They have been applying antibiotic cream and dressing the foot.  This occurred about 1 week ago.  They present for further treatment and evaluation ? ?Past Medical History:  ?Diagnosis Date  ? ESRD (end stage renal disease) ([place])   ? Gastroparesis   ? Herniated cervical disc   ? HTN (hypertension)   ? Hypercholesteremia   ? Morbid obesity ([place])   ? OSA (obstructive sleep apnea)   ? Peritoneal dialysis status ([place])   ? PONV (postoperative nausea and vomiting)   ? after peritoneal dialysis catheter was placed  ? S/P cardiac cath   ? a. 10-15 yrs ago at [facility] due to tachycardia, reportedly normal. b. Normal ETT 03/2012.  ? Sinus tachycardia   ? a. 24-hr Holter 03/2012 - SR, occ PVCs, no VT, avg HR 96bpm.  ? TIA (transient ischemic attack)   ? Type 1 diabetes mellitus ([place])   ? a. With insulin pump.  ? ? ? ? ? ?Objective/Physical Exam ?Neurovascular status intact. Maceration noted to the incision site has improved significantly.  ? ?There continues to be a wound noted to the left hallux measuring 0.3 x 0.3 x 0.1 cm.Granulation tissue.  Wound base is red.  No active bleeding.  No serous drainage.  Wound appears stable.  Periwound is  callused. ? ?Third-degree blisters noted to the first, third, and fifth toe of the right foot with skin loss into the subcutaneous tissue.  Please see above attached photo ?The blisters appear stable with no clinical evidence of infection. ? ?Assessment: ?1. Ulceration of the left hallux secondary to diabetes mellitus ?2.  Blisters 1, 3, 5 toes RT foot ?3.  Diabetes mellitus with peripheral polyneuropathy ? ? ?Plan of Care:  ?1. Patient was evaluated.  Wound appears stable   ?2.  Medically necessary excisional debridement including subcutaneous tissue was performed using a tissue nipper.  Excisional debridement of all necrotic nonviable tissue down to the healthy bleeding viable tissue was performed with post debridement measurement same as pre- ?3.   Continue gentamicin cream and a light Band-Aid to the left great toe wound ?4.  Diabetic shoes and insoles still pending ?5.  Light debridement of the blister lesions was performed using a tissue nipper.  Silvadene cream and dressings applied ?6.  Prescription for Silvadene 1% cream sent to the pharmacy.  Apply daily with a light dressing ?7.  Postsurgical shoe dispensed.  Wear daily ?8.  Return to clinic in 2 weeks ? ?*Spouse's name is Lattie Haw.  Currently on the kidney transplant recipient list at [facility] ? ?Edrick Kins, DPM ?[place] ? ?Dr. Edrick Kins, DPM  ?  ?[address]                                       ?[place], [place] [address]                ?Office [phone]  ?Fax [phone] ? ? ? ? ? ?

## 2021-12-11 NOTE — Telephone Encounter (Signed)
Patient was called to schedule a diabetic shoe fitting and did not reach out to [place] to schedule. I was not made aware he was going to be present at the [facility] clinic today and did not bring his shoes and insoles for fitting. Patient has been made aware he will be contacted for scheduling so that his supplies will be ready for fitting. ?

## 2021-12-12 DIAGNOSIS — N186 End stage renal disease: Secondary | ICD-10-CM | POA: Diagnosis not present

## 2021-12-12 DIAGNOSIS — Z992 Dependence on renal dialysis: Secondary | ICD-10-CM | POA: Diagnosis not present

## 2021-12-13 DIAGNOSIS — Z992 Dependence on renal dialysis: Secondary | ICD-10-CM | POA: Diagnosis not present

## 2021-12-13 DIAGNOSIS — N186 End stage renal disease: Secondary | ICD-10-CM | POA: Diagnosis not present

## 2021-12-14 DIAGNOSIS — N186 End stage renal disease: Secondary | ICD-10-CM | POA: Diagnosis not present

## 2021-12-14 DIAGNOSIS — Z992 Dependence on renal dialysis: Secondary | ICD-10-CM | POA: Diagnosis not present

## 2021-12-15 DIAGNOSIS — N186 End stage renal disease: Secondary | ICD-10-CM | POA: Diagnosis not present

## 2021-12-15 DIAGNOSIS — Z992 Dependence on renal dialysis: Secondary | ICD-10-CM | POA: Diagnosis not present

## 2021-12-16 DIAGNOSIS — E1021 Type 1 diabetes mellitus with diabetic nephropathy: Secondary | ICD-10-CM | POA: Diagnosis not present

## 2021-12-16 DIAGNOSIS — Z9989 Dependence on other enabling machines and devices: Secondary | ICD-10-CM | POA: Diagnosis not present

## 2021-12-16 DIAGNOSIS — Z6835 Body mass index (BMI) 35.0-35.9, adult: Secondary | ICD-10-CM | POA: Diagnosis not present

## 2021-12-16 DIAGNOSIS — Z9641 Presence of insulin pump (external) (internal): Secondary | ICD-10-CM | POA: Diagnosis not present

## 2021-12-16 DIAGNOSIS — G4733 Obstructive sleep apnea (adult) (pediatric): Secondary | ICD-10-CM | POA: Diagnosis not present

## 2021-12-16 DIAGNOSIS — Z01818 Encounter for other preprocedural examination: Secondary | ICD-10-CM | POA: Diagnosis not present

## 2021-12-16 DIAGNOSIS — N186 End stage renal disease: Secondary | ICD-10-CM | POA: Diagnosis not present

## 2021-12-16 DIAGNOSIS — E669 Obesity, unspecified: Secondary | ICD-10-CM | POA: Diagnosis not present

## 2021-12-16 DIAGNOSIS — E10319 Type 1 diabetes mellitus with unspecified diabetic retinopathy without macular edema: Secondary | ICD-10-CM | POA: Diagnosis not present

## 2021-12-16 DIAGNOSIS — I120 Hypertensive chronic kidney disease with stage 5 chronic kidney disease or end stage renal disease: Secondary | ICD-10-CM | POA: Diagnosis not present

## 2021-12-16 DIAGNOSIS — Z992 Dependence on renal dialysis: Secondary | ICD-10-CM | POA: Diagnosis not present

## 2021-12-16 DIAGNOSIS — E785 Hyperlipidemia, unspecified: Secondary | ICD-10-CM | POA: Diagnosis not present

## 2021-12-16 LAB — POCT GLYCOSYLATED HEMOGLOBIN (HGB A1C): Hemoglobin A1C: 9.2 % — AB (ref 4.0–5.6)

## 2021-12-16 NOTE — Addendum Note (Signed)
Addended by: Cinda Quest on: 12/16/2021 05:11 PM ? ? Modules accepted: Orders ? ?

## 2021-12-17 ENCOUNTER — Other Ambulatory Visit: Payer: Self-pay

## 2021-12-17 ENCOUNTER — Other Ambulatory Visit (HOSPITAL_COMMUNITY): Payer: Self-pay

## 2021-12-17 DIAGNOSIS — Z992 Dependence on renal dialysis: Secondary | ICD-10-CM | POA: Diagnosis not present

## 2021-12-17 DIAGNOSIS — N186 End stage renal disease: Secondary | ICD-10-CM | POA: Diagnosis not present

## 2021-12-17 MED ORDER — DEXCOM G6 TRANSMITTER MISC
2 refills | Status: DC
  Filled 2021-12-17: qty 1, 90d supply, fill #0
  Filled 2022-03-12: qty 1, 90d supply, fill #1
  Filled 2022-05-25: qty 1, 90d supply, fill #0

## 2021-12-17 MED ORDER — DEXCOM G6 SENSOR MISC
2 refills | Status: DC
  Filled 2021-12-17: qty 9, 90d supply, fill #0
  Filled 2022-03-12: qty 9, 90d supply, fill #1
  Filled 2022-05-25: qty 9, 90d supply, fill #0

## 2021-12-17 NOTE — Telephone Encounter (Signed)
Patient called in states he has appt with linda next week and wants the [facility] sensor and transmitter sent in to [facility] Rx sent ?

## 2021-12-18 DIAGNOSIS — N186 End stage renal disease: Secondary | ICD-10-CM | POA: Diagnosis not present

## 2021-12-18 DIAGNOSIS — Z992 Dependence on renal dialysis: Secondary | ICD-10-CM | POA: Diagnosis not present

## 2021-12-19 DIAGNOSIS — N186 End stage renal disease: Secondary | ICD-10-CM | POA: Diagnosis not present

## 2021-12-19 DIAGNOSIS — Z992 Dependence on renal dialysis: Secondary | ICD-10-CM | POA: Diagnosis not present

## 2021-12-20 DIAGNOSIS — N186 End stage renal disease: Secondary | ICD-10-CM | POA: Diagnosis not present

## 2021-12-20 DIAGNOSIS — Z992 Dependence on renal dialysis: Secondary | ICD-10-CM | POA: Diagnosis not present

## 2021-12-21 ENCOUNTER — Telehealth: Payer: Self-pay | Admitting: Pharmacy Technician

## 2021-12-21 ENCOUNTER — Other Ambulatory Visit (HOSPITAL_COMMUNITY): Payer: Self-pay

## 2021-12-21 DIAGNOSIS — N186 End stage renal disease: Secondary | ICD-10-CM | POA: Diagnosis not present

## 2021-12-21 DIAGNOSIS — Z992 Dependence on renal dialysis: Secondary | ICD-10-CM | POA: Diagnosis not present

## 2021-12-21 NOTE — Telephone Encounter (Signed)
Received notification from [facility] regarding a prior authorization for [place]. Authorization has been APPROVED from 3.20.23 to 3.19.24.  ? ?Per test claim, copay for 30 days supply is $0 ? ? ?TRANSMITTER Authorization # [id number] ?SENSOR Authorization # [id number] ? ? ?

## 2021-12-21 NOTE — Telephone Encounter (Signed)
Patient Advocate Encounter ? ?Received notification from [facility] that prior authorization for [place] is required. ?  ?PA submitted on 3.20.23 ?Sensor Key [id number] ?Transmitter Key [place] ?Status is pending ?  ?[facility] will continue to follow ? ?Kaeson Kleinert R Malikiah Debarr, CPhT ?Patient Advocate ?[facility] ?Phone: [phone] ?Fax:  [phone] ? ?

## 2021-12-21 NOTE — Telephone Encounter (Signed)
PA has been submitted.

## 2021-12-21 NOTE — Telephone Encounter (Signed)
Patient called states pharmacy is requesting PA to be done for his Dexcom sensors. He is scheduled to have pump start on Wednesday. Is there anyway to expedite? ?

## 2021-12-22 ENCOUNTER — Other Ambulatory Visit (HOSPITAL_COMMUNITY): Payer: Self-pay

## 2021-12-22 DIAGNOSIS — Z992 Dependence on renal dialysis: Secondary | ICD-10-CM | POA: Diagnosis not present

## 2021-12-22 DIAGNOSIS — R220 Localized swelling, mass and lump, head: Secondary | ICD-10-CM | POA: Diagnosis not present

## 2021-12-22 DIAGNOSIS — N186 End stage renal disease: Secondary | ICD-10-CM | POA: Diagnosis not present

## 2021-12-23 ENCOUNTER — Other Ambulatory Visit: Payer: Self-pay

## 2021-12-23 ENCOUNTER — Encounter: Payer: 59 | Attending: Endocrinology | Admitting: Nutrition

## 2021-12-23 DIAGNOSIS — Z992 Dependence on renal dialysis: Secondary | ICD-10-CM | POA: Diagnosis not present

## 2021-12-23 DIAGNOSIS — N186 End stage renal disease: Secondary | ICD-10-CM | POA: Diagnosis not present

## 2021-12-23 DIAGNOSIS — E1040 Type 1 diabetes mellitus with diabetic neuropathy, unspecified: Secondary | ICD-10-CM | POA: Insufficient documentation

## 2021-12-23 DIAGNOSIS — R590 Localized enlarged lymph nodes: Secondary | ICD-10-CM | POA: Diagnosis not present

## 2021-12-23 NOTE — Progress Notes (Signed)
Patient is here with his wife and both are trained on how to use the Tandem Control I Q insulin pump. ?He was trained on the Dexcom sensor and this was liked to [place].  The sensor was inserted into his left arm and linked to his pump.  His pump was linked to the T-Connect app and then to [place], ?Settings were transferred from his Medtronic pump:  Basal rate: MN: 0.5, 2AM: 1.35, 4AM: 0.9, 6AM: 1.0, 10AM:1.5, 7PM: 1.5.  IC: MN: 7, 12 noon: 8, 5PM: 7, ISF: MN: 40, 8AM:25, 8PM: 30, Target 140 timing 3 hours.  ?Control IQ was turned on. Pt. Was shown how to bolus, and respond to alerts and alarms.  He filled a cartridge with Humalog insulin and attached an auto soft 90 87m infusion set to his abdomen.without difficulty.   ?He was given handouts on how to fill a cartridge, attach the infusion set/start/stop pump, make setting changes, and linking pump to T-Connnet app, and symbols on his pump.  We reviewed each of these and he signed the checklist as understanding all topics with no final questions. ? ?

## 2021-12-24 ENCOUNTER — Telehealth: Payer: Self-pay | Admitting: Nutrition

## 2021-12-24 ENCOUNTER — Other Ambulatory Visit (HOSPITAL_COMMUNITY): Payer: Self-pay

## 2021-12-24 DIAGNOSIS — N186 End stage renal disease: Secondary | ICD-10-CM | POA: Diagnosis not present

## 2021-12-24 DIAGNOSIS — M7021 Olecranon bursitis, right elbow: Secondary | ICD-10-CM | POA: Diagnosis not present

## 2021-12-24 DIAGNOSIS — M7041 Prepatellar bursitis, right knee: Secondary | ICD-10-CM | POA: Diagnosis not present

## 2021-12-24 DIAGNOSIS — Z992 Dependence on renal dialysis: Secondary | ICD-10-CM | POA: Diagnosis not present

## 2021-12-24 NOTE — Patient Instructions (Signed)
Read over handouts given and call if questions. ?Call Tandem help line if questions about insulin pump ?See Dr. Dwyane Dee in one week. ?Call office if blood sugars go over 250, or drop below 70 ?

## 2021-12-24 NOTE — Telephone Encounter (Signed)
Patient reported that he was having pump problems all night, because the bluetooth to his T-Connect app kept failing, and he needed to re pair his pump.  His blood sugar was high this AM due to just coming off dialysis and he did a correction dose at 7AM and blood sugas dropped to 80 at 9:30.  He ate breakfast, and had another low before lunch today.  HE was told to call the office in the AM, and have Dr. Dwyane Dee look at his download.  He agreed to do this. ?While we were talking, he go another alarm that his bluetooth signal was lost and he needed to re link his pump to his phone.  He was told to call Tandem help line for this, and he agreed to do this. ?

## 2021-12-25 ENCOUNTER — Other Ambulatory Visit: Payer: Self-pay

## 2021-12-25 ENCOUNTER — Encounter: Payer: Self-pay | Admitting: Podiatry

## 2021-12-25 ENCOUNTER — Ambulatory Visit (INDEPENDENT_AMBULATORY_CARE_PROVIDER_SITE_OTHER): Payer: 59

## 2021-12-25 ENCOUNTER — Ambulatory Visit: Payer: 59 | Admitting: Podiatry

## 2021-12-25 DIAGNOSIS — E0843 Diabetes mellitus due to underlying condition with diabetic autonomic (poly)neuropathy: Secondary | ICD-10-CM

## 2021-12-25 DIAGNOSIS — L97522 Non-pressure chronic ulcer of other part of left foot with fat layer exposed: Secondary | ICD-10-CM | POA: Diagnosis not present

## 2021-12-25 DIAGNOSIS — L97512 Non-pressure chronic ulcer of other part of right foot with fat layer exposed: Secondary | ICD-10-CM | POA: Diagnosis not present

## 2021-12-25 DIAGNOSIS — N186 End stage renal disease: Secondary | ICD-10-CM | POA: Diagnosis not present

## 2021-12-25 DIAGNOSIS — Z992 Dependence on renal dialysis: Secondary | ICD-10-CM | POA: Diagnosis not present

## 2021-12-25 MED ORDER — CIPROFLOXACIN HCL 500 MG PO TABS
500.0000 mg | ORAL_TABLET | Freq: Two times a day (BID) | ORAL | 0 refills | Status: AC
  Filled 2021-12-25: qty 20, 10d supply, fill #0

## 2021-12-25 MED ORDER — HYDROCODONE-ACETAMINOPHEN 5-325 MG PO TABS
1.0000 | ORAL_TABLET | Freq: Three times a day (TID) | ORAL | 0 refills | Status: DC | PRN
  Filled 2021-12-25: qty 20, 7d supply, fill #0

## 2021-12-25 NOTE — Progress Notes (Signed)
SITUATION ?Reason for Visit: Fitting of Diabetic Insoles ?Patient / Caregiver Report:  Patient is satisfied with fit and function of insoles, but does not want the shoes and does not want replacements. ? ?OBJECTIVE DATA: ?Patient History / Diagnosis:   ?  ICD-10-CM   ?1. Diabetes mellitus due to underlying condition with diabetic autonomic neuropathy, unspecified whether long term insulin use (HCC)  E08.43   ?  ?2. Ulcer of great toe, right, with fat layer exposed ([place])  L97.512   ?  ?3. Ulcer of great toe, left, with fat layer exposed ([place])  L97.522   ?  ? ? ?Change in Status:   None ? ?ACTIONS PERFORMED: ?In-Person Delivery, patient was fit with: ?- 3x pair [phone] PDAC approved vacuum formed custom diabetic insoles; [employer]: [id number] ? ?Insoles were verified for structural integrity and safety. Patient wore insoles in office. Skin was inspected and free of areas of concern after wearing inserts. Inserts fit properly. Patient / Caregiver provided with ferbal instruction and demonstration regarding donning, doffing, wear, care, proper fit, function, purpose, cleaning, and use of insoles ' and in all related precautions and risks and benefits regarding insoles. Patient / Caregiver was instructed to wear properly fitting socks with shoes at all times. Patient was also provided with verbal instruction regarding how to report any failures or malfunctions of inserts, and necessary follow up care. Patient / Caregiver was also instructed to contact physician regarding change in status that may affect function of inserts.  ? ?Patient / Caregiver verbalized undersatnding of instruction provided. Patient / Caregiver demonstrated independence with proper donning and doffing of shoes and inserts. ? ?PLAN ?Patient to follow with treating physician as recommended. Plan of care was discussed with and agreed upon by patient and/or caregiver. All questions were answered and concerns addressed. ? ?
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
## 2021-12-26 DIAGNOSIS — N186 End stage renal disease: Secondary | ICD-10-CM | POA: Diagnosis not present

## 2021-12-26 DIAGNOSIS — Z992 Dependence on renal dialysis: Secondary | ICD-10-CM | POA: Diagnosis not present

## 2021-12-27 DIAGNOSIS — N186 End stage renal disease: Secondary | ICD-10-CM | POA: Diagnosis not present

## 2021-12-27 DIAGNOSIS — Z992 Dependence on renal dialysis: Secondary | ICD-10-CM | POA: Diagnosis not present

## 2021-12-28 ENCOUNTER — Encounter: Payer: Self-pay | Admitting: Endocrinology

## 2021-12-28 ENCOUNTER — Other Ambulatory Visit: Payer: Self-pay

## 2021-12-28 ENCOUNTER — Ambulatory Visit: Payer: 59 | Admitting: Endocrinology

## 2021-12-28 VITALS — BP 122/64 | HR 87 | Ht 71.0 in | Wt 242.6 lb

## 2021-12-28 DIAGNOSIS — E1065 Type 1 diabetes mellitus with hyperglycemia: Secondary | ICD-10-CM | POA: Diagnosis not present

## 2021-12-28 DIAGNOSIS — N186 End stage renal disease: Secondary | ICD-10-CM | POA: Diagnosis not present

## 2021-12-28 DIAGNOSIS — Z992 Dependence on renal dialysis: Secondary | ICD-10-CM | POA: Diagnosis not present

## 2021-12-28 NOTE — Progress Notes (Signed)
Patient ID: [name], male   DOB: 05-23-1971, 51 y.o.   MRN: [id number] ? ?       ? ? ?Reason for Appointment : Follow-up for Type 1 Diabetes ? ?History of Present Illness  ? ?        ?Diagnosis: Type 1 diabetes mellitus, date of diagnosis:  1997      ? ? ?Previous history:  ?He has been variously on insulin injections and insulin pumps since his diagnosis ?He has been on insulin pump on and off for the last few years with fair control ? ?Recent history:  ? ?INSULIN pump settings with the MINIMED 670 G pump: ? ?Active basal rate #1: 12 AM = 0.3, 3 AM = 1.0, from 6 AM = 1.25, 10 AM = 1.50 and 8 PM = 1.3 ? ?Carbohydrate coverage 1: 7 at breakfast and suppertime and 1: 8 at lunch ? ?Sensitivity 1: 40 ? ?Current target 110 and active insulin 5 hours ? ?A1c is last 9.2 ? ? ?Current management, blood sugar patterns and problems identified:   ?He is using the t-slim pump since afternoon of 12/23/2021 ? ?Currently having some issues with the glucose not bearing with his pump and the connection falling off consistently ?His correction factor was changed during the daytime because of getting some excessive correction during the day but more recently has not had any low sugars even overnight ? ?Current blood sugar patterns are as follows ?OVERNIGHT blood sugars are excellent on the first 2 days with only mild increase around 1-2 AM the first 2 nights but the last 3 nights her blood sugars are brought up to between 200 and 350 ?With taking a correction bolus when his blood sugar was 170 and this gradually improved his sugar last night ?POSTPRANDIAL readings are somewhat variable but appear to be mostly going higher to over 200 or more especially after lunch and 1 with entering only about 20-25 g of carbohydrate ?Evening blood sugars have been variably high after dinner and may be low normal the last 2 nights when he says his intake was less but he still put in 35 g ?He may sometimes have different strength of dialysate  for his dialysis overnight ? ?Bolus history: He is trying to take his meal time boluses more consistently at the time of his meal but may not be until blood sugars have started to go up a little because of continued fear of hypoglycemia  ?He is also going to be putting carbohydrates for snacks  ?Recently on some of the nights he has taken correction boluses for high blood sugars with inadequate response during the night ? ? ? ?Recent CGM average 165 on his new pump ? ?PREVIOUS CGM data: GMI 7.8 ? ?CGM use % of time 96  ?2-week average/GV 189  ?Time in range       46 %  ?% Time Above 180 28  ?% Time above 250 23  ?% Time Below 70 3  ? ?  ?PRE-MEAL Fasting Lunch Dinner Bedtime Overall  ?Glucose range:       ?Averages: 170  180 185   ? ?POST-MEAL PC Breakfast PC Lunch PC Dinner  ?Glucose range:     ?Averages: ? ? 187  ? ? ? ?Self-care: ?Typical breakfast will be toast, eggs and bacon ?Usually lunch will be chicken, pasta and green beans ?Typical dinner: Steak asparagus and corn ?Snacks peanut butter crackers and chips ? ?          ?     ?  Dietician consultation: Most recent: none.         ?CDE consultation: Years ago ? ?Diabetes labs: ? ?Lab Results  ?Component Value Date  ? HGBA1C 9.2 (A) 12/11/2021  ? HGBA1C 9.2 (A) 10/02/2021  ? HGBA1C 8.6 (H) 02/10/2021  ? ?Lab Results  ?Component Value Date  ? MICROALBUR 93.4 (H) 02/10/2021  ? [place] 95 02/10/2021  ? CREATININE 14.09 (H) 10/13/2021  ? ? ?Lab Results  ?Component Value Date  ? MICRALBCREAT 156.1 (H) 02/10/2021  ? ? ? ?Allergies as of 12/28/2021   ? ?   Reactions  ? Sulfa Antibiotics Anaphylaxis, Swelling  ? Pt is not allergic to iodine, has had iodine in the past w/o premeds and w/ no problems  ? Oxycontin [oxycodone Hcl] Nausea And Vomiting  ? Penicillins Other (See Comments)  ? Possible reaction many years ago per patient (Pt reports he has had pcn without issues since time of "reaction") ?Other reaction(s): Unknown  ? Prednisone Other (See Comments)  ? Patient is  diabetic, runs sugar up  ? Shellfish-derived Products Swelling  ? Eyes swell with shellfish ?Pt is not allergic to iodine, has had iodine in the past w/o premeds and w/ no problem  ? Pregabalin Other (See Comments)  ? Muscle twitching and jerks  ? ?  ? ?  ?Medication List  ?  ? ?  ? Accurate as of December 28, 2021  1:38 PM. If you have any questions, ask your nurse or doctor.  ?  ?  ? ?  ? ?acetaminophen 500 MG tablet ?Commonly known as: TYLENOL ?Take 500-1,000 mg by mouth every 6 (six) hours as needed for mild pain or fever. ?  ?amLODipine 10 MG tablet ?Commonly known as: NORVASC ?Take by mouth. ?  ?amLODipine 10 MG tablet ?Commonly known as: NORVASC ?TAKE 1 TABLET BY MOUTH ONCE DAILY ?  ?aspirin EC 81 MG tablet ?Take 81 mg by mouth daily. Swallow whole. ?  ?atorvastatin 80 MG tablet ?Commonly known as: LIPITOR ?TAKE 1 TABLET BY MOUTH ONCE DAILY ?What changed: how much to take ?  ?Auryxia 1 GM 210 MG(Fe) tablet ?Generic drug: ferric citrate ?Take 3 tablets by mouth 3 times a day with a meal ?  ?carvedilol 25 MG tablet ?Commonly known as: COREG ?take one tablet by mouth 2 times daily with meals ?  ?cinacalcet 60 MG tablet ?Commonly known as: SENSIPAR ?Take 1 tablet (60 mg total) by mouth daily. ?  ?ciprofloxacin 500 MG tablet ?Commonly known as: Cipro ?Take 1 tablet (500 mg total) by mouth 2 (two) times daily for 10 days. ?  ?cloNIDine 0.1 MG tablet ?Commonly known as: CATAPRES ?Take 0.1 mg by mouth 2 (two) times daily. ?  ?Dexcom G6 Transmitter Misc ?Change every 3 months ?  ?FreeStyle Libre 3 Sensor Misc ?Apply 1 sensor on upper arm every 14 days for continuous glucose monitoring ?  ?Dexcom G6 Sensor Misc ?Change every 10 days ?  ?gentamicin cream 0.1 % ?Commonly known as: GARAMYCIN ?Apply pea size amount to exit size daily. ?What changed:  ?how much to take ?when to take this ?reasons to take this ?  ?Glucagon Emergency 1 MG Kit ?Inject into the muscle as directed for severe sugar. ?  ?HumaLOG 100 UNIT/ML  injection ?Generic drug: insulin lispro ?Use up to 100 units total via insulin pump daily ?  ?HYDROcodone-acetaminophen 5-325 MG tablet ?Commonly known as: NORCO/VICODIN ?Take 1 tablet by mouth every 8 (eight) hours as needed for moderate pain. ?  ?losartan  100 MG tablet ?Commonly known as: COZAAR ?TAKE 1 TABLET BY MOUTH ONCE DAILY ?  ?ondansetron 4 MG tablet ?Commonly known as: Zofran ?Take 1 tablet by mouth every 8 hours as needed for nausea or vomiting. ?  ?oxyCODONE-acetaminophen 5-325 MG tablet ?Commonly known as: Percocet ?Take 1 tablet by mouth every 6 hours ?  ?pregabalin 75 MG capsule ?Commonly known as: Lyrica ?Take 1 capsule (75 mg total) by mouth daily. ?  ?Silvadene 1 % cream ?Generic drug: silver sulfADIAZINE ?Apply to affected area once daily ?  ?torsemide 100 MG tablet ?Commonly known as: DEMADEX ?Take 1 tablet by mouth once a day ?  ?Velphoro 500 MG chewable tablet ?Generic drug: sucroferric oxyhydroxide ?Chew 2 tablets by mouth 3 times a day with food ?  ? ?  ? ? ?Allergies:  ?Allergies  ?Allergen Reactions  ? Sulfa Antibiotics Anaphylaxis and Swelling  ?  Pt is not allergic to iodine, has had iodine in the past w/o premeds and w/ no problems  ? Oxycontin [Oxycodone Hcl] Nausea And Vomiting  ? Penicillins Other (See Comments)  ?  Possible reaction many years ago per patient (Pt reports he has had pcn without issues since time of "reaction") ?Other reaction(s): Unknown  ? Prednisone Other (See Comments)  ?  Patient is diabetic, runs sugar up ? ?  ? Shellfish-Derived Products Swelling  ?  Eyes swell with shellfish ?Pt is not allergic to iodine, has had iodine in the past w/o premeds and w/ no problem  ? Pregabalin Other (See Comments)  ?  Muscle twitching and jerks  ? ? ?Past Medical History:  ?Diagnosis Date  ? ESRD (end stage renal disease) ([place])   ? Gastroparesis   ? Herniated cervical disc   ? HTN (hypertension)   ? Hypercholesteremia   ? Morbid obesity ([place])   ? OSA (obstructive sleep apnea)   ?  Peritoneal dialysis status ([place])   ? PONV (postoperative nausea and vomiting)   ? after peritoneal dialysis catheter was placed  ? S/P cardiac cath   ? a. 10-15 yrs ago at [facility] due to tachycardia, reportedly no

## 2021-12-29 ENCOUNTER — Encounter: Payer: 59 | Admitting: Nutrition

## 2021-12-29 DIAGNOSIS — N186 End stage renal disease: Secondary | ICD-10-CM | POA: Diagnosis not present

## 2021-12-29 DIAGNOSIS — Z992 Dependence on renal dialysis: Secondary | ICD-10-CM | POA: Diagnosis not present

## 2021-12-29 NOTE — Telephone Encounter (Signed)
Patient reports having contacted customer service 3X and they tell him to do a hard stop on the pump, and relink.  He as tried this 3 times today, and is needing to do this again.  He was told to call customer care back and tell them the doctor's office says this is unacceptable and to send you out a new pump today.  He agreed to do this and to call me back  ?

## 2021-12-30 ENCOUNTER — Other Ambulatory Visit: Payer: Self-pay

## 2021-12-30 ENCOUNTER — Other Ambulatory Visit (HOSPITAL_COMMUNITY): Payer: Self-pay

## 2021-12-30 DIAGNOSIS — Z992 Dependence on renal dialysis: Secondary | ICD-10-CM | POA: Diagnosis not present

## 2021-12-30 DIAGNOSIS — N186 End stage renal disease: Secondary | ICD-10-CM | POA: Diagnosis not present

## 2021-12-31 ENCOUNTER — Other Ambulatory Visit (HOSPITAL_COMMUNITY): Payer: Self-pay

## 2021-12-31 DIAGNOSIS — N186 End stage renal disease: Secondary | ICD-10-CM | POA: Diagnosis not present

## 2021-12-31 DIAGNOSIS — Z992 Dependence on renal dialysis: Secondary | ICD-10-CM | POA: Diagnosis not present

## 2021-12-31 MED ORDER — CARVEDILOL 25 MG PO TABS
ORAL_TABLET | ORAL | 0 refills | Status: DC
  Filled 2021-12-31: qty 180, 90d supply, fill #0
  Filled 2022-03-12: qty 180, 90d supply, fill #1
  Filled 2022-08-02: qty 180, 90d supply, fill #0

## 2021-12-31 MED ORDER — CLONIDINE HCL 0.1 MG PO TABS
ORAL_TABLET | ORAL | 2 refills | Status: DC
  Filled 2021-12-31: qty 270, 90d supply, fill #0

## 2022-01-01 ENCOUNTER — Other Ambulatory Visit (HOSPITAL_COMMUNITY): Payer: Self-pay

## 2022-01-01 DIAGNOSIS — N186 End stage renal disease: Secondary | ICD-10-CM | POA: Diagnosis not present

## 2022-01-01 DIAGNOSIS — Z992 Dependence on renal dialysis: Secondary | ICD-10-CM | POA: Diagnosis not present

## 2022-01-02 ENCOUNTER — Other Ambulatory Visit (HOSPITAL_COMMUNITY): Payer: Self-pay

## 2022-01-02 DIAGNOSIS — N186 End stage renal disease: Secondary | ICD-10-CM | POA: Diagnosis not present

## 2022-01-02 DIAGNOSIS — Z992 Dependence on renal dialysis: Secondary | ICD-10-CM | POA: Diagnosis not present

## 2022-01-03 DIAGNOSIS — Z992 Dependence on renal dialysis: Secondary | ICD-10-CM | POA: Diagnosis not present

## 2022-01-03 DIAGNOSIS — N186 End stage renal disease: Secondary | ICD-10-CM | POA: Diagnosis not present

## 2022-01-04 ENCOUNTER — Other Ambulatory Visit (HOSPITAL_COMMUNITY): Payer: Self-pay

## 2022-01-04 DIAGNOSIS — E109 Type 1 diabetes mellitus without complications: Secondary | ICD-10-CM | POA: Diagnosis not present

## 2022-01-04 DIAGNOSIS — Z13220 Encounter for screening for lipoid disorders: Secondary | ICD-10-CM | POA: Diagnosis not present

## 2022-01-04 DIAGNOSIS — Z992 Dependence on renal dialysis: Secondary | ICD-10-CM | POA: Diagnosis not present

## 2022-01-04 DIAGNOSIS — N186 End stage renal disease: Secondary | ICD-10-CM | POA: Diagnosis not present

## 2022-01-05 ENCOUNTER — Ambulatory Visit: Payer: 59 | Admitting: Podiatry

## 2022-01-05 ENCOUNTER — Encounter: Payer: Self-pay | Admitting: Podiatry

## 2022-01-05 DIAGNOSIS — E0843 Diabetes mellitus due to underlying condition with diabetic autonomic (poly)neuropathy: Secondary | ICD-10-CM

## 2022-01-05 DIAGNOSIS — L97512 Non-pressure chronic ulcer of other part of right foot with fat layer exposed: Secondary | ICD-10-CM

## 2022-01-05 DIAGNOSIS — N186 End stage renal disease: Secondary | ICD-10-CM | POA: Diagnosis not present

## 2022-01-05 DIAGNOSIS — Z992 Dependence on renal dialysis: Secondary | ICD-10-CM | POA: Diagnosis not present

## 2022-01-06 DIAGNOSIS — N186 End stage renal disease: Secondary | ICD-10-CM | POA: Diagnosis not present

## 2022-01-06 DIAGNOSIS — Z992 Dependence on renal dialysis: Secondary | ICD-10-CM | POA: Diagnosis not present
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
## 2022-01-07 DIAGNOSIS — Z992 Dependence on renal dialysis: Secondary | ICD-10-CM | POA: Diagnosis not present

## 2022-01-07 DIAGNOSIS — N186 End stage renal disease: Secondary | ICD-10-CM | POA: Diagnosis not present

## 2022-01-08 ENCOUNTER — Encounter: Payer: 59 | Attending: Physician Assistant | Admitting: Physician Assistant

## 2022-01-08 ENCOUNTER — Other Ambulatory Visit (HOSPITAL_COMMUNITY): Payer: Self-pay

## 2022-01-08 ENCOUNTER — Other Ambulatory Visit: Payer: Self-pay

## 2022-01-08 DIAGNOSIS — T25321A Burn of third degree of right foot, initial encounter: Secondary | ICD-10-CM | POA: Diagnosis not present

## 2022-01-08 DIAGNOSIS — X018XXA Other exposure to uncontrolled fire, not in building or structure, initial encounter: Secondary | ICD-10-CM | POA: Insufficient documentation

## 2022-01-08 DIAGNOSIS — N186 End stage renal disease: Secondary | ICD-10-CM | POA: Insufficient documentation

## 2022-01-08 DIAGNOSIS — T25221A Burn of second degree of right foot, initial encounter: Secondary | ICD-10-CM | POA: Insufficient documentation

## 2022-01-08 DIAGNOSIS — E1122 Type 2 diabetes mellitus with diabetic chronic kidney disease: Secondary | ICD-10-CM | POA: Insufficient documentation

## 2022-01-08 DIAGNOSIS — I2510 Atherosclerotic heart disease of native coronary artery without angina pectoris: Secondary | ICD-10-CM | POA: Diagnosis not present

## 2022-01-08 DIAGNOSIS — I120 Hypertensive chronic kidney disease with stage 5 chronic kidney disease or end stage renal disease: Secondary | ICD-10-CM | POA: Insufficient documentation

## 2022-01-08 DIAGNOSIS — Z992 Dependence on renal dialysis: Secondary | ICD-10-CM | POA: Insufficient documentation

## 2022-01-08 DIAGNOSIS — E1143 Type 2 diabetes mellitus with diabetic autonomic (poly)neuropathy: Secondary | ICD-10-CM | POA: Insufficient documentation
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
## 2022-01-08 NOTE — Progress Notes (Signed)
Matthew, [name] ([id number]) ?Visit Report for 01/08/2022 ?Abuse Risk Screen Details ?Patient Name: [name], [name] ?Date of Service: 01/08/2022 10:00 AM ?Medical Record Number: [id number] ?Patient Account Number: [id number] ?Date of Birth/Sex: 09/23/1971 (51 y.o. M) ?Treating RN: [name] ?Primary Care [name]: [name] Other Clinician: ?Referring [name]: [name] ?Treating [name]/Extender: [name] ?Weeks in Treatment: 0 ?Abuse Risk Screen Items ?Answer ?ABUSE RISK SCREEN: ?Has anyone close to you tried to hurt or harm you recentlyo No ?Do you feel uncomfortable with anyone in your familyo No ?Has anyone forced you do things that you didnot want to doo No ?Electronic Signature(s) ?Signed: 01/08/2022 3:14:37 PM By: [name] ?Entered ByDonnamarie [name] on 01/08/2022 10:16:24 ?[name], [name] ([id number]) ?-------------------------------------------------------------------------------- ?Activities of Daily Living Details ?Patient Name: [name], [name] ?Date of Service: 01/08/2022 10:00 AM ?Medical Record Number: [id number] ?Patient Account Number: [id number] ?Date of Birth/Sex: 05-15-1971 (51 y.o. M) ?Treating RN: [name] ?Primary Care Rennie Hack: [name] Other Clinician: ?Referring [name]: [name] ?Treating Iliyana Convey/Extender: [name] ?Weeks in Treatment: 0 ?Activities of Daily Living Items ?Answer ?Activities of Daily Living (Please select one for each item) ?[place] ?Take Medications Completely Able ?Use Telephone Completely Able ?Care for Appearance Completely Able ?Use Toilet Completely Able ?Bath / Shower Completely Able ?Dress Self Completely Able ?Feed Self Completely Able ?Walk Completely Able ?Get In / Out Bed Completely Able ?Housework Completely Able ?Prepare Meals Completely Able ?Handle Money Completely Able ?Shop for Self Completely Able ?Electronic Signature(s) ?Signed: 01/08/2022 3:14:37 PM By: [name] ?Entered ByDonnamarie [name]  on 01/08/2022 10:16:42 ?[name], [name] ([id number]) ?-------------------------------------------------------------------------------- ?Education Screening Details ?Patient Name: [name], [name] ?Date of Service: 01/08/2022 10:00 AM ?Medical Record Number: [id number] ?Patient Account Number: [id number] ?Date of Birth/Sex: 1971-03-11 (51 y.o. M) ?Treating RN: [name] ?Primary Care [name]: [name] Other Clinician: ?Referring [name]: [name] ?Treating [name]/Extender: [name] ?Weeks in Treatment: 0 ?Primary Learner Assessed: Patient ?Learning Preferences/Education Level/Primary Language ?Learning Preference: Explanation ?Highest Education Level: College or Above ?Preferred Language: English ?Cognitive Barrier ?Language Barrier: No ?Translator Needed: No ?Memory Deficit: No ?Emotional Barrier: No ?Cultural/Religious Beliefs Affecting Medical Care: No ?Physical Barrier ?Impaired Vision: No ?Impaired Hearing: No ?Decreased Hand dexterity: No ?Knowledge/Comprehension ?Knowledge Level: High ?Comprehension Level: High ?Ability to understand written instructions: High ?Ability to understand verbal instructions: High ?Motivation ?Anxiety Level: Calm ?Cooperation: Cooperative ?Education Importance: Acknowledges Need ?Interest in Health Problems: Asks Questions ?Perception: Coherent ?Willingness to Engage in Self-Management ?High ?Activities: ?Readiness to Engage in Self-Management ?High ?Activities: ?Electronic Signature(s) ?Signed: 01/08/2022 3:14:37 PM By: [name] ?Entered ByDonnamarie [name] on 01/08/2022 10:17:02 ?[name], [name] ([id number]) ?-------------------------------------------------------------------------------- ?Fall Risk Assessment Details ?Patient Name: [name], [name] ?Date of Service: 01/08/2022 10:00 AM ?Medical Record Number: [id number] ?Patient Account Number: [id number] ?Date of Birth/Sex: 27-Jan-1971 (51 y.o. M) ?Treating RN: [name] ?Primary Care [name]:  [name] Other Clinician: ?Referring [name]: [name] ?Treating Aaban Griep/Extender: [name] ?Weeks in Treatment: 0 ?Fall Risk Assessment Items ?Have you had 2 or more falls in the last 12 monthso 0 No ?Have you had any fall that resulted in injury in the last 12 monthso 0 No ?FALLS RISK SCREEN ?History of falling - immediate or within 3 months 0 No ?Secondary diagnosis (Do you have 2 or more medical diagnoseso) 15 Yes ?Ambulatory aid ?None/bed rest/wheelchair/nurse 0 Yes ?Crutches/cane/walker 0 No ?Furniture 0 No ?Intravenous therapy Access/Saline/Heparin Lock 0 No ?Gait/Transferring ?Normal/ bed rest/ wheelchair 0 Yes ?Weak (short steps with or without shuffle,  stooped but able to lift head while walking, may ?0 No ?seek support from furniture) ?Impaired (short steps with shuffle, may have difficulty arising from chair, head down, impaired ?0 No ?balance) ?Mental Status ?Oriented to own ability 0 Yes ?Electronic Signature(s) ?Signed: 01/08/2022 3:14:37 PM By: [name] ?Entered ByDonnamarie [name] on 01/08/2022 10:17:15 ?[name], [name] ([id number]) ?-------------------------------------------------------------------------------- ?Foot Assessment Details ?Patient Name: [name], [name] ?Date of Service: 01/08/2022 10:00 AM ?Medical Record Number: [id number] ?Patient Account Number: [id number] ?Date of Birth/Sex: [birth year] (51 y.o. M) ?Treating RN: [name] ?Primary Care [name]: [name] Other Clinician: ?Referring [name]: [name] ?Treating [name]/Extender: [name] ?Weeks in Treatment: 0 ?Foot Assessment Items ?Site Locations ?+ = Sensation present, - = Sensation absent, C = Callus, U = Ulcer ?R = Redness, W = Warmth, M = Maceration, PU = Pre-ulcerative lesion ?F = Fissure, S = Swelling, D = Dryness ?Assessment ?Right: Left: ?Other Deformity: No No ?Prior Foot Ulcer: No No ?Prior Amputation: No No ?Charcot Joint: No No ?Ambulatory Status: Ambulatory Without Help ?Gait:  Steady ?Electronic Signature(s) ?Signed: 01/08/2022 3:14:37 PM By: [name] ?Entered ByDonnamarie [name] on 01/08/2022 10:20:33 ?[name], [name] ([id number]) ?-------------------------------------------------------------------------------- ?Nutrition Risk Screening Details ?Patient Name: [name], [name] ?Date of Service: 01/08/2022 10:00 AM ?Medical Record Number: [id number] ?Patient Account Number: [id number] ?Date of Birth/Sex: 22-Aug-1971 (51 y.o. M) ?Treating RN: [name] ?Primary Care Kaliana Albino: [name] Other Clinician: ?Referring Sadako Cegielski: [name] ?Treating Ezell Melikian/Extender: [name] ?Weeks in Treatment: 0 ?Height (in): 71 ?Weight (lbs): 141 ?Body Mass Index (BMI): 19.7 ?Nutrition Risk Screening Items ?Score Screening ?NUTRITION RISK SCREEN: ?I have an illness or condition that made me change the kind and/or amount of food I eat 0 No ?I eat fewer than two meals per day 0 No ?I eat few fruits and vegetables, or milk products 0 No ?I have three or more drinks of beer, liquor or wine almost every day 0 No ?I have tooth or mouth problems that make it hard for me to eat 0 No ?I don't always have enough money to buy the food I need 0 No ?I eat alone most of the time 0 No ?I take three or more different prescribed or over-the-counter drugs a day 1 Yes ?Without wanting to, I have lost or gained 10 pounds in the last six months 0 No ?I am not always physically able to shop, cook and/or feed myself 0 No ?Nutrition Protocols ?Good Risk Protocol 0 No interventions needed ?Moderate Risk Protocol ?High Risk Proctocol ?Risk Level: Good Risk ?Score: 1 ?Electronic Signature(s) ?Signed: 01/08/2022 3:14:37 PM By: [name] ?Entered ByDonnamarie [name] on 01/08/2022 10:17:25 ?

## 2022-01-09 DIAGNOSIS — N186 End stage renal disease: Secondary | ICD-10-CM | POA: Diagnosis not present

## 2022-01-09 DIAGNOSIS — Z992 Dependence on renal dialysis: Secondary | ICD-10-CM | POA: Diagnosis not present

## 2022-01-10 DIAGNOSIS — N186 End stage renal disease: Secondary | ICD-10-CM | POA: Diagnosis not present

## 2022-01-10 DIAGNOSIS — Z992 Dependence on renal dialysis: Secondary | ICD-10-CM | POA: Diagnosis not present

## 2022-01-11 ENCOUNTER — Other Ambulatory Visit (HOSPITAL_COMMUNITY): Payer: Self-pay

## 2022-01-11 DIAGNOSIS — N186 End stage renal disease: Secondary | ICD-10-CM | POA: Diagnosis not present

## 2022-01-11 DIAGNOSIS — Z992 Dependence on renal dialysis: Secondary | ICD-10-CM | POA: Diagnosis not present

## 2022-01-11 MED ORDER — ATORVASTATIN CALCIUM 80 MG PO TABS
80.0000 mg | ORAL_TABLET | Freq: Every day | ORAL | 11 refills | Status: DC
  Filled 2022-01-11: qty 30, 30d supply, fill #0

## 2022-01-12 ENCOUNTER — Other Ambulatory Visit (HOSPITAL_COMMUNITY): Payer: Self-pay

## 2022-01-12 DIAGNOSIS — Z992 Dependence on renal dialysis: Secondary | ICD-10-CM | POA: Diagnosis not present

## 2022-01-12 DIAGNOSIS — N186 End stage renal disease: Secondary | ICD-10-CM | POA: Diagnosis not present

## 2022-01-13 ENCOUNTER — Encounter: Payer: Self-pay | Admitting: Endocrinology

## 2022-01-13 DIAGNOSIS — Z992 Dependence on renal dialysis: Secondary | ICD-10-CM | POA: Diagnosis not present

## 2022-01-13 DIAGNOSIS — N186 End stage renal disease: Secondary | ICD-10-CM | POA: Diagnosis not present

## 2022-01-14 ENCOUNTER — Encounter (INDEPENDENT_AMBULATORY_CARE_PROVIDER_SITE_OTHER): Payer: 59

## 2022-01-14 DIAGNOSIS — N186 End stage renal disease: Secondary | ICD-10-CM | POA: Diagnosis not present

## 2022-01-14 DIAGNOSIS — Z992 Dependence on renal dialysis: Secondary | ICD-10-CM | POA: Diagnosis not present

## 2022-01-15 DIAGNOSIS — Z992 Dependence on renal dialysis: Secondary | ICD-10-CM | POA: Diagnosis not present

## 2022-01-15 DIAGNOSIS — N186 End stage renal disease: Secondary | ICD-10-CM | POA: Diagnosis not present

## 2022-01-16 DIAGNOSIS — N186 End stage renal disease: Secondary | ICD-10-CM | POA: Diagnosis not present

## 2022-01-16 DIAGNOSIS — Z992 Dependence on renal dialysis: Secondary | ICD-10-CM | POA: Diagnosis not present

## 2022-01-17 DIAGNOSIS — Z992 Dependence on renal dialysis: Secondary | ICD-10-CM | POA: Diagnosis not present

## 2022-01-17 DIAGNOSIS — N186 End stage renal disease: Secondary | ICD-10-CM | POA: Diagnosis not present

## 2022-01-18 ENCOUNTER — Encounter: Payer: 59 | Admitting: Physician Assistant

## 2022-01-18 DIAGNOSIS — I2510 Atherosclerotic heart disease of native coronary artery without angina pectoris: Secondary | ICD-10-CM | POA: Diagnosis not present

## 2022-01-18 DIAGNOSIS — T25321A Burn of third degree of right foot, initial encounter: Secondary | ICD-10-CM | POA: Diagnosis not present

## 2022-01-18 DIAGNOSIS — I120 Hypertensive chronic kidney disease with stage 5 chronic kidney disease or end stage renal disease: Secondary | ICD-10-CM | POA: Diagnosis not present

## 2022-01-18 DIAGNOSIS — E1122 Type 2 diabetes mellitus with diabetic chronic kidney disease: Secondary | ICD-10-CM | POA: Diagnosis not present

## 2022-01-18 DIAGNOSIS — Z992 Dependence on renal dialysis: Secondary | ICD-10-CM | POA: Diagnosis not present

## 2022-01-18 DIAGNOSIS — E1143 Type 2 diabetes mellitus with diabetic autonomic (poly)neuropathy: Secondary | ICD-10-CM | POA: Diagnosis not present

## 2022-01-18 DIAGNOSIS — T25221A Burn of second degree of right foot, initial encounter: Secondary | ICD-10-CM | POA: Diagnosis not present

## 2022-01-18 DIAGNOSIS — N186 End stage renal disease: Secondary | ICD-10-CM | POA: Diagnosis not present
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
## 2022-01-19 ENCOUNTER — Ambulatory Visit: Payer: 59 | Admitting: Physician Assistant

## 2022-01-19 DIAGNOSIS — N186 End stage renal disease: Secondary | ICD-10-CM | POA: Diagnosis not present

## 2022-01-19 DIAGNOSIS — Z992 Dependence on renal dialysis: Secondary | ICD-10-CM | POA: Diagnosis not present

## 2022-01-20 DIAGNOSIS — N186 End stage renal disease: Secondary | ICD-10-CM | POA: Diagnosis not present

## 2022-01-20 DIAGNOSIS — Z992 Dependence on renal dialysis: Secondary | ICD-10-CM | POA: Diagnosis not present

## 2022-01-21 ENCOUNTER — Other Ambulatory Visit (HOSPITAL_COMMUNITY): Payer: Self-pay

## 2022-01-21 DIAGNOSIS — Z992 Dependence on renal dialysis: Secondary | ICD-10-CM | POA: Diagnosis not present

## 2022-01-21 DIAGNOSIS — N186 End stage renal disease: Secondary | ICD-10-CM | POA: Diagnosis not present

## 2022-01-22 ENCOUNTER — Other Ambulatory Visit (HOSPITAL_COMMUNITY): Payer: Self-pay

## 2022-01-22 DIAGNOSIS — N186 End stage renal disease: Secondary | ICD-10-CM | POA: Diagnosis not present

## 2022-01-22 DIAGNOSIS — Z992 Dependence on renal dialysis: Secondary | ICD-10-CM | POA: Diagnosis not present

## 2022-01-23 DIAGNOSIS — N186 End stage renal disease: Secondary | ICD-10-CM | POA: Diagnosis not present

## 2022-01-23 DIAGNOSIS — Z992 Dependence on renal dialysis: Secondary | ICD-10-CM | POA: Diagnosis not present

## 2022-01-24 DIAGNOSIS — Z992 Dependence on renal dialysis: Secondary | ICD-10-CM | POA: Diagnosis not present

## 2022-01-24 DIAGNOSIS — N186 End stage renal disease: Secondary | ICD-10-CM | POA: Diagnosis not present

## 2022-01-25 ENCOUNTER — Other Ambulatory Visit (HOSPITAL_COMMUNITY): Payer: Self-pay

## 2022-01-25 DIAGNOSIS — N186 End stage renal disease: Secondary | ICD-10-CM | POA: Diagnosis not present

## 2022-01-25 DIAGNOSIS — Z992 Dependence on renal dialysis: Secondary | ICD-10-CM | POA: Diagnosis not present

## 2022-01-25 MED ORDER — LOSARTAN POTASSIUM 100 MG PO TABS
100.0000 mg | ORAL_TABLET | Freq: Every day | ORAL | 11 refills | Status: DC
  Filled 2022-01-25: qty 90, 90d supply, fill #0

## 2022-01-26 ENCOUNTER — Other Ambulatory Visit (HOSPITAL_COMMUNITY): Payer: Self-pay

## 2022-01-26 ENCOUNTER — Encounter: Payer: 59 | Admitting: Physician Assistant

## 2022-01-26 DIAGNOSIS — I120 Hypertensive chronic kidney disease with stage 5 chronic kidney disease or end stage renal disease: Secondary | ICD-10-CM | POA: Diagnosis not present

## 2022-01-26 DIAGNOSIS — N186 End stage renal disease: Secondary | ICD-10-CM | POA: Diagnosis not present

## 2022-01-26 DIAGNOSIS — I2510 Atherosclerotic heart disease of native coronary artery without angina pectoris: Secondary | ICD-10-CM | POA: Diagnosis not present

## 2022-01-26 DIAGNOSIS — T25221A Burn of second degree of right foot, initial encounter: Secondary | ICD-10-CM | POA: Diagnosis not present

## 2022-01-26 DIAGNOSIS — T25331A Burn of third degree of right toe(s) (nail), initial encounter: Secondary | ICD-10-CM | POA: Diagnosis not present

## 2022-01-26 DIAGNOSIS — Z992 Dependence on renal dialysis: Secondary | ICD-10-CM | POA: Diagnosis not present

## 2022-01-26 DIAGNOSIS — E1122 Type 2 diabetes mellitus with diabetic chronic kidney disease: Secondary | ICD-10-CM | POA: Diagnosis not present

## 2022-01-26 DIAGNOSIS — E1143 Type 2 diabetes mellitus with diabetic autonomic (poly)neuropathy: Secondary | ICD-10-CM | POA: Diagnosis not present

## 2022-01-26 MED ORDER — CINACALCET HCL 60 MG PO TABS
120.0000 mg | ORAL_TABLET | Freq: Every day | ORAL | 12 refills | Status: DC
  Filled 2022-01-26: qty 60, 30d supply, fill #0
  Filled 2022-03-12 (×2): qty 60, 30d supply, fill #1

## 2022-01-27 ENCOUNTER — Other Ambulatory Visit (HOSPITAL_COMMUNITY): Payer: Self-pay

## 2022-01-27 DIAGNOSIS — Z992 Dependence on renal dialysis: Secondary | ICD-10-CM | POA: Diagnosis not present

## 2022-01-27 DIAGNOSIS — N186 End stage renal disease: Secondary | ICD-10-CM | POA: Diagnosis not present
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
## 2022-01-28 DIAGNOSIS — Z992 Dependence on renal dialysis: Secondary | ICD-10-CM | POA: Diagnosis not present

## 2022-01-28 DIAGNOSIS — N186 End stage renal disease: Secondary | ICD-10-CM | POA: Diagnosis not present

## 2022-01-29 ENCOUNTER — Other Ambulatory Visit (HOSPITAL_COMMUNITY): Payer: Self-pay

## 2022-01-29 DIAGNOSIS — N186 End stage renal disease: Secondary | ICD-10-CM | POA: Diagnosis not present

## 2022-01-29 DIAGNOSIS — Z992 Dependence on renal dialysis: Secondary | ICD-10-CM | POA: Diagnosis not present

## 2022-01-30 DIAGNOSIS — N186 End stage renal disease: Secondary | ICD-10-CM | POA: Diagnosis not present

## 2022-01-30 DIAGNOSIS — Z992 Dependence on renal dialysis: Secondary | ICD-10-CM | POA: Diagnosis not present

## 2022-01-31 DIAGNOSIS — Z992 Dependence on renal dialysis: Secondary | ICD-10-CM | POA: Diagnosis not present

## 2022-01-31 DIAGNOSIS — N186 End stage renal disease: Secondary | ICD-10-CM | POA: Diagnosis not present

## 2022-02-01 DIAGNOSIS — Z992 Dependence on renal dialysis: Secondary | ICD-10-CM | POA: Diagnosis not present

## 2022-02-01 DIAGNOSIS — N186 End stage renal disease: Secondary | ICD-10-CM | POA: Diagnosis not present

## 2022-02-02 ENCOUNTER — Encounter: Payer: 59 | Attending: Physician Assistant | Admitting: Physician Assistant

## 2022-02-02 DIAGNOSIS — Y92096 Garden or yard of other non-institutional residence as the place of occurrence of the external cause: Secondary | ICD-10-CM | POA: Diagnosis not present

## 2022-02-02 DIAGNOSIS — T25321A Burn of third degree of right foot, initial encounter: Secondary | ICD-10-CM | POA: Diagnosis not present

## 2022-02-02 DIAGNOSIS — I120 Hypertensive chronic kidney disease with stage 5 chronic kidney disease or end stage renal disease: Secondary | ICD-10-CM | POA: Insufficient documentation

## 2022-02-02 DIAGNOSIS — N186 End stage renal disease: Secondary | ICD-10-CM | POA: Diagnosis not present

## 2022-02-02 DIAGNOSIS — I2510 Atherosclerotic heart disease of native coronary artery without angina pectoris: Secondary | ICD-10-CM | POA: Insufficient documentation

## 2022-02-02 DIAGNOSIS — Z992 Dependence on renal dialysis: Secondary | ICD-10-CM | POA: Diagnosis not present

## 2022-02-02 DIAGNOSIS — X030XXA Exposure to flames in controlled fire, not in building or structure, initial encounter: Secondary | ICD-10-CM | POA: Insufficient documentation

## 2022-02-02 DIAGNOSIS — E1143 Type 2 diabetes mellitus with diabetic autonomic (poly)neuropathy: Secondary | ICD-10-CM | POA: Insufficient documentation
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
## 2022-02-03 DIAGNOSIS — N186 End stage renal disease: Secondary | ICD-10-CM | POA: Diagnosis not present

## 2022-02-03 DIAGNOSIS — Z992 Dependence on renal dialysis: Secondary | ICD-10-CM | POA: Diagnosis not present

## 2022-02-04 DIAGNOSIS — Z01818 Encounter for other preprocedural examination: Secondary | ICD-10-CM | POA: Diagnosis not present

## 2022-02-04 DIAGNOSIS — E1021 Type 1 diabetes mellitus with diabetic nephropathy: Secondary | ICD-10-CM | POA: Diagnosis not present

## 2022-02-04 DIAGNOSIS — N186 End stage renal disease: Secondary | ICD-10-CM | POA: Diagnosis not present

## 2022-02-04 DIAGNOSIS — Z992 Dependence on renal dialysis: Secondary | ICD-10-CM | POA: Diagnosis not present

## 2022-02-05 DIAGNOSIS — Z992 Dependence on renal dialysis: Secondary | ICD-10-CM | POA: Diagnosis not present

## 2022-02-05 DIAGNOSIS — N186 End stage renal disease: Secondary | ICD-10-CM | POA: Diagnosis not present

## 2022-02-06 DIAGNOSIS — N186 End stage renal disease: Secondary | ICD-10-CM | POA: Diagnosis not present

## 2022-02-06 DIAGNOSIS — Z992 Dependence on renal dialysis: Secondary | ICD-10-CM | POA: Diagnosis not present

## 2022-02-07 DIAGNOSIS — N186 End stage renal disease: Secondary | ICD-10-CM | POA: Diagnosis not present

## 2022-02-07 DIAGNOSIS — Z992 Dependence on renal dialysis: Secondary | ICD-10-CM | POA: Diagnosis not present

## 2022-02-08 DIAGNOSIS — Z992 Dependence on renal dialysis: Secondary | ICD-10-CM | POA: Diagnosis not present

## 2022-02-08 DIAGNOSIS — N186 End stage renal disease: Secondary | ICD-10-CM | POA: Diagnosis not present

## 2022-02-09 ENCOUNTER — Encounter: Payer: 59 | Admitting: Physician Assistant

## 2022-02-09 DIAGNOSIS — I2510 Atherosclerotic heart disease of native coronary artery without angina pectoris: Secondary | ICD-10-CM | POA: Diagnosis not present

## 2022-02-09 DIAGNOSIS — I120 Hypertensive chronic kidney disease with stage 5 chronic kidney disease or end stage renal disease: Secondary | ICD-10-CM | POA: Diagnosis not present

## 2022-02-09 DIAGNOSIS — T25331A Burn of third degree of right toe(s) (nail), initial encounter: Secondary | ICD-10-CM | POA: Diagnosis not present

## 2022-02-09 DIAGNOSIS — Z992 Dependence on renal dialysis: Secondary | ICD-10-CM | POA: Diagnosis not present

## 2022-02-09 DIAGNOSIS — N186 End stage renal disease: Secondary | ICD-10-CM | POA: Diagnosis not present

## 2022-02-09 DIAGNOSIS — E1143 Type 2 diabetes mellitus with diabetic autonomic (poly)neuropathy: Secondary | ICD-10-CM | POA: Diagnosis not present

## 2022-02-09 DIAGNOSIS — T25321A Burn of third degree of right foot, initial encounter: Secondary | ICD-10-CM | POA: Diagnosis not present
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
## 2022-02-10 DIAGNOSIS — N186 End stage renal disease: Secondary | ICD-10-CM | POA: Diagnosis not present

## 2022-02-10 DIAGNOSIS — Z992 Dependence on renal dialysis: Secondary | ICD-10-CM | POA: Diagnosis not present

## 2022-02-11 DIAGNOSIS — Z992 Dependence on renal dialysis: Secondary | ICD-10-CM | POA: Diagnosis not present

## 2022-02-11 DIAGNOSIS — N186 End stage renal disease: Secondary | ICD-10-CM | POA: Diagnosis not present

## 2022-02-12 DIAGNOSIS — N186 End stage renal disease: Secondary | ICD-10-CM | POA: Diagnosis not present

## 2022-02-12 DIAGNOSIS — Z992 Dependence on renal dialysis: Secondary | ICD-10-CM | POA: Diagnosis not present

## 2022-02-13 DIAGNOSIS — Z992 Dependence on renal dialysis: Secondary | ICD-10-CM | POA: Diagnosis not present

## 2022-02-13 DIAGNOSIS — N186 End stage renal disease: Secondary | ICD-10-CM | POA: Diagnosis not present

## 2022-02-14 DIAGNOSIS — Z992 Dependence on renal dialysis: Secondary | ICD-10-CM | POA: Diagnosis not present

## 2022-02-14 DIAGNOSIS — N186 End stage renal disease: Secondary | ICD-10-CM | POA: Diagnosis not present

## 2022-02-15 ENCOUNTER — Ambulatory Visit: Payer: 59 | Admitting: Endocrinology

## 2022-02-15 DIAGNOSIS — Z7682 Awaiting organ transplant status: Secondary | ICD-10-CM | POA: Diagnosis not present

## 2022-02-15 DIAGNOSIS — I120 Hypertensive chronic kidney disease with stage 5 chronic kidney disease or end stage renal disease: Secondary | ICD-10-CM | POA: Diagnosis not present

## 2022-02-15 DIAGNOSIS — E1122 Type 2 diabetes mellitus with diabetic chronic kidney disease: Secondary | ICD-10-CM | POA: Diagnosis not present

## 2022-02-15 DIAGNOSIS — Z01818 Encounter for other preprocedural examination: Secondary | ICD-10-CM | POA: Diagnosis not present

## 2022-02-15 DIAGNOSIS — N186 End stage renal disease: Secondary | ICD-10-CM | POA: Diagnosis not present

## 2022-02-15 DIAGNOSIS — Z01810 Encounter for preprocedural cardiovascular examination: Secondary | ICD-10-CM | POA: Diagnosis not present

## 2022-02-15 DIAGNOSIS — Z794 Long term (current) use of insulin: Secondary | ICD-10-CM | POA: Diagnosis not present

## 2022-02-15 DIAGNOSIS — Z9641 Presence of insulin pump (external) (internal): Secondary | ICD-10-CM | POA: Diagnosis not present

## 2022-02-15 DIAGNOSIS — Z992 Dependence on renal dialysis: Secondary | ICD-10-CM | POA: Diagnosis not present

## 2022-02-16 ENCOUNTER — Encounter: Payer: 59 | Admitting: Physician Assistant

## 2022-02-16 DIAGNOSIS — I120 Hypertensive chronic kidney disease with stage 5 chronic kidney disease or end stage renal disease: Secondary | ICD-10-CM | POA: Diagnosis not present

## 2022-02-16 DIAGNOSIS — Z992 Dependence on renal dialysis: Secondary | ICD-10-CM | POA: Diagnosis not present

## 2022-02-16 DIAGNOSIS — T25321A Burn of third degree of right foot, initial encounter: Secondary | ICD-10-CM | POA: Diagnosis not present

## 2022-02-16 DIAGNOSIS — N186 End stage renal disease: Secondary | ICD-10-CM | POA: Diagnosis not present

## 2022-02-16 DIAGNOSIS — T25331A Burn of third degree of right toe(s) (nail), initial encounter: Secondary | ICD-10-CM | POA: Diagnosis not present

## 2022-02-16 DIAGNOSIS — I2510 Atherosclerotic heart disease of native coronary artery without angina pectoris: Secondary | ICD-10-CM | POA: Diagnosis not present

## 2022-02-16 DIAGNOSIS — E1143 Type 2 diabetes mellitus with diabetic autonomic (poly)neuropathy: Secondary | ICD-10-CM | POA: Diagnosis not present

## 2022-02-16 NOTE — Progress Notes (Addendum)
ROLLO, FARQUHAR ([id number]) Visit Report for 02/16/2022 Arrival Information Details Patient Name: [name], [name]. Date of Service: 02/16/2022 3:00 PM Medical Record Number: [id number] Patient Account Number: [id number] Date of Birth/Sex: October 07, 1970 (51 y.o. M) Treating RN: [name] Primary Care [name]: [name] Other Clinician: Referring [name]: [name] Treating [name]/Extender: [name] in Treatment: 5 Visit Information History Since Last Visit Added or deleted any medications: No Patient Arrived: Ambulatory Any new allergies or adverse reactions: No Arrival Time: 15:16 Had a fall or experienced change in No Accompanied By: self activities of daily living that may affect Transfer Assistance: None risk of falls: Patient Identification Verified: Yes Hospitalized since last visit: No Secondary Verification Process Completed: Yes Has Dressing in Place as Prescribed: Yes Patient Requires Transmission-Based No Pain Present Now: No Precautions: Patient Has Alerts: Yes Patient Alerts: Patient on Blood Thinner DIABETIC aspirin 71m Non compressible ABI Righ Electronic Signature(s) Signed: 02/16/2022 4:14:45 PM By: GLevora DredgeEntered By: GLevora Dredgeon 02/16/2022 15:17:45 Matthew NanD. ([id number] -------------------------------------------------------------------------------- Clinic Level of Care Assessment Details Patient Name: Matthew NanD. Date of Service: 02/16/2022 3:00 PM Medical Record Number: [id number] Account Number: [id number] of Birth/Sex: 104-07-1971(51y.o. M) Treating RN: GLevora DredgePrimary Care Voncile Schwarz: Matthew LemonOther Clinician: Referring [name]: Matthew LemonTreating [name]/Extender: [name] Treatment: 5 Clinic Level of Care Assessment Items TOOL 1 Quantity Score []  - Use when EandM and Procedure is performed on INITIAL visit 0 ASSESSMENTS - Nursing  Assessment / Reassessment []  - General Physical Exam (combine w/ comprehensive assessment (listed just below) when performed on new 0 pt. evals) []  - 0 Comprehensive Assessment (HX, ROS, Risk Assessments, Wounds Hx, etc.) ASSESSMENTS - Wound and Skin Assessment / Reassessment []  - Dermatologic / Skin Assessment (not related to wound area) 0 ASSESSMENTS - Ostomy and/or Continence Assessment and Care []  - Incontinence Assessment and Management 0 []  - 0 Ostomy Care Assessment and Management (repouching, etc.) PROCESS - Coordination of Care []  - Simple Patient / Family Education for ongoing care 0 []  - 0 Complex (extensive) Patient / Family Education for ongoing care []  - 0 Staff obtains CProgrammer, systems Records, Test Results / Process Orders []  - 0 Staff telephones HHA, Nursing Homes / Clarify orders / etc []  - 0 Routine Transfer to another Facility (non-emergent condition) []  - 0 Routine Hospital Admission (non-emergent condition) []  - 0 New Admissions / IBiomedical engineer/ Ordering NPWT, Apligraf, etc. []  - 0 Emergency Hospital Admission (emergent condition) PROCESS - Special Needs []  - Pediatric / Minor Patient Management 0 []  - 0 Isolation Patient Management []  - 0 Hearing / Language / Visual special needs []  - 0 Assessment of Community assistance (transportation, [name]/C planning, etc.) []  - 0 Additional assistance / Altered mentation []  - 0 Support Surface(s) Assessment (bed, cushion, seat, etc.) INTERVENTIONS - Miscellaneous []  - External ear exam 0 []  - 0 Patient Transfer (multiple staff / HCivil Service fast streamer/ Similar devices) []  - 0 Simple Staple / Suture removal (25 or less) []  - 0 Complex Staple / Suture removal (26 or more) []  - 0 Hypo/Hyperglycemic Management (do not check if billed separately) []  - 0 Ankle / Brachial Index (ABI) - do not check if billed separately Has the patient been seen at the hospital within the last three years: Yes Total Score: 0 Level Of  Care: ____ GJenelle [name]([id number] Electronic Signature(s) Signed: 02/16/2022 4:14:45 PM By: GLevora DredgeEntered By: GLevora Dredgeon 02/16/2022 16:00:30 [name] ([id number]) -------------------------------------------------------------------------------- Encounter Discharge Information Details Patient Name: [name]. Date of Service: 02/16/2022 3:00 PM Medical Record Number: [id number] Patient Account Number: [id number] Date of Birth/Sex: [birth year] (51 y.o. M) Treating RN: [name] Primary Care Gertha Lichtenberg: [name] Other Clinician: Referring Dajanique Robley: [name] Treating Deaven Urwin/Extender: [name] in Treatment: 5 Encounter Discharge Information Items Post Procedure Vitals Discharge Condition: Stable Temperature (F): 97.5 Ambulatory Status: Ambulatory Pulse (bpm): 77 Discharge Destination: Home Respiratory Rate (breaths/min): 18 Transportation: Private Auto Blood Pressure (mmHg): 168/77 Accompanied By: self Schedule Follow-up Appointment: Yes Clinical Summary of Care: Patient Declined Electronic Signature(s) Signed: 02/16/2022 4:14:45 PM By: [name] Entered By: [name] on 02/16/2022 16:02:39 Matthew Nan [name]. ([id number]) -------------------------------------------------------------------------------- Lower Extremity Assessment Details Patient Name: Matthew Nan [name]. Date of Service: 02/16/2022 3:00 PM Medical Record Number: [id number] Patient Account Number: [id number] Date of Birth/Sex: May 24, 1971 (51 y.o. M) Treating RN: [name] Primary Care Shine Mikes: [name] Other Clinician: Referring [name]: [name] Treating Haniel Fix/Extender: Jeri Cos Weeks in Treatment: 5 Edema Assessment Assessed: [Left: No] [Right: No] [Left: Edema] [Right: :] Calf Left: Right: Point of Measurement: 33 cm From Medial Instep 44 cm Ankle Left: Right: Point of Measurement: 10 cm From  Medial Instep 28.5 cm Vascular Assessment Pulses: Dorsalis Pedis Palpable: [Right:Yes] Electronic Signature(s) Signed: 02/16/2022 4:14:45 PM By: [name] Entered By: [name] on 02/16/2022 15:26:19 Matthew Nan [name]. ([id number]) -------------------------------------------------------------------------------- Multi Wound Chart Details Patient Name: Matthew Nan [name]. Date of Service: 02/16/2022 3:00 PM Medical Record Number: [id number] Patient Account Number: [id number] Date of Birth/Sex: 01/28/1971 (51 y.o. M) Treating RN: [name] Primary Care Arsal Tappan: [name] Other Clinician: Referring Jude Naclerio: [name] Treating Reighn Kaplan/Extender: [name] in Treatment: 5 Vital Signs Height(in): 71 Pulse(bpm): 48 Weight(lbs): 141 Blood Pressure(mmHg): 168/77 Body Mass Index(BMI): 19.7 Temperature(F): 97.5 Respiratory Rate(breaths/min): 16 Photos: [N/A:N/A] Wound Location: Right, Distal Toe Great Right, Lateral Toe Fifth N/A Wounding Event: Thermal Burn Thermal Burn N/A Primary Etiology: 3rd degree Burn 3rd degree Burn N/A Comorbid History: Sleep Apnea, Coronary Artery Sleep Apnea, Coronary Artery N/A Disease, Hypertension, Type I Disease, Hypertension, Type I Diabetes, End Stage Renal Disease, Diabetes, End Stage Renal Disease, Osteoarthritis, Neuropathy Osteoarthritis, Neuropathy Date Acquired: 12/05/2021 12/05/2021 N/A Weeks of Treatment: 5 5 N/A Wound Status: Open Open N/A Wound Recurrence: No No N/A Measurements L x W x [name] (cm) 0.1x0.1x0.1 1.2x0.5x1 N/A Area (cm) : 0.008 0.471 N/A Volume (cm) : 0.001 0.471 N/A % Reduction in Area: 99.00% 80.00% N/A % Reduction in Volume: 98.80% -99.60% N/A Classification: Full Thickness Without Exposed Full Thickness Without Exposed N/A Support Structures Support Structures Exudate Amount: None Present Medium N/A Exudate Type: N/A Serosanguineous N/A Exudate Color: N/A red, brown N/A Granulation  Amount: None Present (0%) Small (1-33%) N/A Granulation Quality: N/A Red, Pink N/A Necrotic Amount: None Present (0%) Large (67-100%) N/A Necrotic Tissue: N/A Eschar, Adherent Slough N/A Exposed Structures: Fascia: No Fat Layer (Subcutaneous Tissue): N/A Fat Layer (Subcutaneous Tissue): Yes No Fascia: No Tendon: No Tendon: No Muscle: No Muscle: No Joint: No Joint: No Bone: No Bone: No Epithelialization: Large (67-100%) Small (1-33%) N/A Treatment Notes Electronic Signature(s) Signed: 02/16/2022 4:14:45 PM By: [name] Entered By: [name] on 02/16/2022 15:27:13 [name], [name]. ([id number]) Jenelle [name] ([id number]) -------------------------------------------------------------------------------- Multi-Disciplinary Care Plan Details Patient Name: Matthew Nan [name]. Date of Service: 02/16/2022 3:00 PM Medical Record Number: [id number] Patient Account Number: [id number] Date of Birth/Sex: [birth year] (51 y.o. M) Treating RN: [name] Primary Care Xzavian Semmel: Edwina Barth,  John Other Clinician: Referring Acacia Latorre: [name] Treating Beverlie Kurihara/Extender: [name] in Treatment: 5 Active Inactive Wound/Skin Impairment Nursing Diagnoses: Impaired tissue integrity Knowledge deficit related to smoking impact on wound healing Knowledge deficit related to ulceration/compromised skin integrity Goals: Patient/caregiver will verbalize understanding of skin care regimen Date Initiated: 01/08/2022 Date Inactivated: 01/18/2022 Target Resolution Date: 01/29/2022 Goal Status: Met Ulcer/skin breakdown will have a volume reduction of 30% by week 4 Date Initiated: 01/08/2022 Date Inactivated: 02/09/2022 Target Resolution Date: 02/05/2022 Goal Status: Met Ulcer/skin breakdown will have a volume reduction of 50% by week 8 Date Initiated: 01/08/2022 Target Resolution Date: 03/05/2022 Goal Status: Active Ulcer/skin breakdown will have a volume reduction of 80% by  week 12 Date Initiated: 01/08/2022 Target Resolution Date: 04/02/2022 Goal Status: Active Ulcer/skin breakdown will heal within 14 weeks Date Initiated: 01/08/2022 Target Resolution Date: 04/30/2022 Goal Status: Active Interventions: Assess patient/caregiver ability to obtain necessary supplies Assess patient/caregiver ability to perform ulcer/skin care regimen upon admission and as needed Assess ulceration(s) every visit Notes: Electronic Signature(s) Signed: 02/16/2022 4:14:45 PM By: [name] Entered By: [name] on 02/16/2022 15:26:59 Matthew Nan [name]. ([id number]) -------------------------------------------------------------------------------- Pain Assessment Details Patient Name: Matthew Nan [name]. Date of Service: 02/16/2022 3:00 PM Medical Record Number: [id number] Patient Account Number: [id number] Date of Birth/Sex: 09-Apr-1971 (51 y.o. M) Treating RN: [name] Primary Care Edwards Mckelvie: [name] Other Clinician: Referring Bev Drennen: [name] Treating Jeanclaude Wentworth/Extender: [name] in Treatment: 5 Active Problems Location of Pain Severity and Description of Pain Patient Has Paino No Site Locations Rate the pain. Current Pain Level: 0 Pain Management and Medication Current Pain Management: Electronic Signature(s) Signed: 02/16/2022 4:14:45 PM By: [name] Entered By: [name] on 02/16/2022 15:19:55 [name], [name]. ([id number]) -------------------------------------------------------------------------------- Patient/Caregiver Education Details Patient Name: Matthew Nan [name]. Date of Service: 02/16/2022 3:00 PM Medical Record Number: [id number] Patient Account Number: [id number] Date of Birth/Gender: Dec 13, 1970 (51 y.o. M) Treating RN: [name] Primary Care Physician: [name] Other Clinician: Referring Physician: Harrel [name] Treating Physician/Extender: [name] in Treatment: 5 Education  Assessment Education Provided To: Patient Education Topics Provided Wound Debridement: Handouts: Wound Debridement Methods: Explain/Verbal Responses: Return demonstration correctly Wound/Skin Impairment: Handouts: Caring for Your Ulcer Methods: Explain/Verbal Responses: Return demonstration correctly Electronic Signature(s) Signed: 02/16/2022 4:14:45 PM By: [name] Entered By: [name] on 02/16/2022 16:01:16 Matthew Nan [name]. ([id number]) -------------------------------------------------------------------------------- Wound Assessment Details Patient Name: Matthew Nan [name]. Date of Service: 02/16/2022 3:00 PM Medical Record Number: [id number] Patient Account Number: [id number] Date of Birth/Sex: 08/13/1971 (51 y.o. M) Treating RN: [name] Primary Care Afia Messenger: [name] Other Clinician: Referring Ryen Heitmeyer: [name] Treating Gustabo Gordillo/Extender: Jeri Cos Weeks in Treatment: 5 Wound Status Wound Number: 1 Primary 3rd degree Burn Etiology: Wound Location: Right, Distal Toe Great Wound Healed - Epithelialized Wounding Event: Thermal Burn Status: Date Acquired: 12/05/2021 Comorbid Sleep Apnea, Coronary Artery Disease, Hypertension, Weeks Of Treatment: 5 History: Type I Diabetes, End Stage Renal Disease, Osteoarthritis, Clustered Wound: No Neuropathy Photos Wound Measurements Length: (cm) 0 Width: (cm) 0 Depth: (cm) 0 Area: (cm) 0 Volume: (cm) 0 % Reduction in Area: 100% % Reduction in Volume: 100% Epithelialization: Large (67-100%) Tunneling: No Undermining: No Wound Description Classification: Full Thickness Without Exposed Support Structures Exudate Amount: None Present Foul Odor After Cleansing: No Slough/Fibrino Yes Wound Bed Granulation Amount: None Present (0%) Exposed Structure Necrotic Amount: None Present (0%) Fascia Exposed: No Fat Layer (Subcutaneous Tissue) Exposed: No Tendon Exposed: No Muscle Exposed:  No Joint Exposed: No Bone Exposed:  No Electronic Signature(s) Signed: 02/16/2022 4:14:45 PM By: [name] Entered By: [name] on 02/16/2022 15:49:23 [name], Matthew DMarland Kitchen ([id number]) -------------------------------------------------------------------------------- Wound Assessment Details Patient Name: Matthew Nan [name]. Date of Service: 02/16/2022 3:00 PM Medical Record Number: [id number] Patient Account Number: [id number] Date of Birth/Sex: 03/03/1971 (51 y.o. M) Treating RN: [name] Primary Care Kypton Eltringham: [name] Other Clinician: Referring Charyl Minervini: [name] Treating Christin Moline/Extender: Jeri Cos Weeks in Treatment: 5 Wound Status Wound Number: 3 Primary 3rd degree Burn Etiology: Wound Location: Right, Lateral Toe Fifth Wound Open Wounding Event: Thermal Burn Status: Date Acquired: 12/05/2021 Comorbid Sleep Apnea, Coronary Artery Disease, Hypertension, Weeks Of Treatment: 5 History: Type I Diabetes, End Stage Renal Disease, Osteoarthritis, Clustered Wound: No Neuropathy Photos Wound Measurements Length: (cm) 1.2 Width: (cm) 0.5 Depth: (cm) 0.1 Area: (cm) 0.471 Volume: (cm) 0.047 % Reduction in Area: 80% % Reduction in Volume: 80.1% Epithelialization: Small (1-33%) Wound Description Classification: Full Thickness Without Exposed Support Structures Exudate Amount: Medium Exudate Type: Serosanguineous Exudate Color: red, brown Foul Odor After Cleansing: No Slough/Fibrino Yes Wound Bed Granulation Amount: Small (1-33%) Exposed Structure Granulation Quality: Red, Pink Fascia Exposed: No Necrotic Amount: Large (67-100%) Fat Layer (Subcutaneous Tissue) Exposed: Yes Necrotic Quality: Eschar Tendon Exposed: No Muscle Exposed: No Joint Exposed: No Bone Exposed: No Treatment Notes Wound #3 (Toe Fifth) Wound Laterality: Right, Lateral Cleanser Normal Saline Discharge Instruction: Wash your hands with soap and water. Remove old  dressing, discard into plastic bag and place into trash. Cleanse the wound with Normal Saline prior to applying a clean dressing using gauze sponges, not tissues or cotton balls. Do not scrub or use excessive force. Pat dry using gauze sponges, not tissue or cotton balls. [name], [name] ([id number]) Wound Cleanser Discharge Instruction: Wash your hands with soap and water. Remove old dressing, discard into plastic bag and place into trash. Cleanse the wound with Wound Cleanser prior to applying a clean dressing using gauze sponges, not tissues or cotton balls. Do not scrub or use excessive force. Pat dry using gauze sponges, not tissue or cotton balls. Peri-Wound Care Topical Primary Dressing Gauze Discharge Instruction: As directed: dry, Prisma 4.34 (in) Discharge Instruction: Moisten w/normal saline or sterile water; Cover wound as directed. Do not remove from wound bed. Secondary Dressing vaseline gauze Secured With Stretch Net Dressing, Latex-free, Size 5, Small-Head / Shoulder / Thigh Compression Wrap Compression Stockings Add-Ons Electronic Signature(s) Signed: 02/16/2022 4:58:37 PM By: Worthy Keeler PA-C Signed: 02/19/2022 1:57:43 PM By: [name] Previous Signature: 02/16/2022 4:14:45 PM Version By: [name] Entered By: Worthy Keeler on 02/16/2022 16:49:38 [name], [name]. ([id number]) -------------------------------------------------------------------------------- Vitals Details Patient Name: Matthew Nan [name]. Date of Service: 02/16/2022 3:00 PM Medical Record Number: [id number] Patient Account Number: [id number] Date of Birth/Sex: September 22, 1971 (51 y.o. M) Treating RN: [name] Primary Care Angelie Kram: [name] Other Clinician: Referring Jolanda Mccann: [name] Treating Rachael Zapanta/Extender: [name] in Treatment: 5 Vital Signs Time Taken: 03:18 Temperature (F): 97.5 Height (in): 71 Pulse (bpm): 77 Weight (lbs):  141 Respiratory Rate (breaths/min): 16 Body Mass Index (BMI): 19.7 Blood Pressure (mmHg): 168/77 Reference Range: 80 - 120 mg / dl Electronic Signature(s) Signed: 02/16/2022 4:14:45 PM By: [name] Entered By: [name] on 02/16/2022 15:19:33
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
## 2022-02-17 DIAGNOSIS — Z992 Dependence on renal dialysis: Secondary | ICD-10-CM | POA: Diagnosis not present

## 2022-02-17 DIAGNOSIS — N186 End stage renal disease: Secondary | ICD-10-CM | POA: Diagnosis not present

## 2022-02-18 DIAGNOSIS — Z992 Dependence on renal dialysis: Secondary | ICD-10-CM | POA: Diagnosis not present

## 2022-02-18 DIAGNOSIS — N186 End stage renal disease: Secondary | ICD-10-CM | POA: Diagnosis not present

## 2022-02-19 DIAGNOSIS — Z992 Dependence on renal dialysis: Secondary | ICD-10-CM | POA: Diagnosis not present

## 2022-02-19 DIAGNOSIS — N186 End stage renal disease: Secondary | ICD-10-CM | POA: Diagnosis not present

## 2022-02-20 DIAGNOSIS — N186 End stage renal disease: Secondary | ICD-10-CM | POA: Diagnosis not present

## 2022-02-20 DIAGNOSIS — Z992 Dependence on renal dialysis: Secondary | ICD-10-CM | POA: Diagnosis not present

## 2022-02-21 DIAGNOSIS — Z992 Dependence on renal dialysis: Secondary | ICD-10-CM | POA: Diagnosis not present

## 2022-02-21 DIAGNOSIS — N186 End stage renal disease: Secondary | ICD-10-CM | POA: Diagnosis not present

## 2022-02-22 DIAGNOSIS — N186 End stage renal disease: Secondary | ICD-10-CM | POA: Diagnosis not present

## 2022-02-22 DIAGNOSIS — R059 Cough, unspecified: Secondary | ICD-10-CM | POA: Diagnosis not present

## 2022-02-22 DIAGNOSIS — J209 Acute bronchitis, unspecified: Secondary | ICD-10-CM | POA: Diagnosis not present

## 2022-02-22 DIAGNOSIS — Z992 Dependence on renal dialysis: Secondary | ICD-10-CM | POA: Diagnosis not present

## 2022-02-23 ENCOUNTER — Encounter: Payer: 59 | Admitting: Physician Assistant

## 2022-02-23 DIAGNOSIS — N186 End stage renal disease: Secondary | ICD-10-CM | POA: Diagnosis not present

## 2022-02-23 DIAGNOSIS — I2510 Atherosclerotic heart disease of native coronary artery without angina pectoris: Secondary | ICD-10-CM | POA: Diagnosis not present

## 2022-02-23 DIAGNOSIS — I120 Hypertensive chronic kidney disease with stage 5 chronic kidney disease or end stage renal disease: Secondary | ICD-10-CM | POA: Diagnosis not present

## 2022-02-23 DIAGNOSIS — Z992 Dependence on renal dialysis: Secondary | ICD-10-CM | POA: Diagnosis not present

## 2022-02-23 DIAGNOSIS — E1143 Type 2 diabetes mellitus with diabetic autonomic (poly)neuropathy: Secondary | ICD-10-CM | POA: Diagnosis not present

## 2022-02-23 DIAGNOSIS — T25321A Burn of third degree of right foot, initial encounter: Secondary | ICD-10-CM | POA: Diagnosis not present
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
## 2022-02-23 NOTE — Progress Notes (Signed)
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 4:07:26 PM By: Levora Dredge Signed: 02/24/2022 5:52:44 PM By: Worthy Keeler PA-C Previous Signature: 02/23/2022 3:48:55 PM Version By: Worthy Keeler PA-C Entered By: Levora Dredge on 02/23/2022 16:07:26

## 2022-02-24 DIAGNOSIS — N186 End stage renal disease: Secondary | ICD-10-CM | POA: Diagnosis not present

## 2022-02-24 DIAGNOSIS — Z992 Dependence on renal dialysis: Secondary | ICD-10-CM | POA: Diagnosis not present

## 2022-02-25 DIAGNOSIS — N186 End stage renal disease: Secondary | ICD-10-CM | POA: Diagnosis not present

## 2022-02-25 DIAGNOSIS — Z992 Dependence on renal dialysis: Secondary | ICD-10-CM | POA: Diagnosis not present

## 2022-02-26 DIAGNOSIS — Z992 Dependence on renal dialysis: Secondary | ICD-10-CM | POA: Diagnosis not present

## 2022-02-26 DIAGNOSIS — N186 End stage renal disease: Secondary | ICD-10-CM | POA: Diagnosis not present

## 2022-02-27 DIAGNOSIS — Z992 Dependence on renal dialysis: Secondary | ICD-10-CM | POA: Diagnosis not present

## 2022-02-27 DIAGNOSIS — N186 End stage renal disease: Secondary | ICD-10-CM | POA: Diagnosis not present

## 2022-02-28 DIAGNOSIS — Z992 Dependence on renal dialysis: Secondary | ICD-10-CM | POA: Diagnosis not present

## 2022-02-28 DIAGNOSIS — N186 End stage renal disease: Secondary | ICD-10-CM | POA: Diagnosis not present

## 2022-03-01 DIAGNOSIS — N186 End stage renal disease: Secondary | ICD-10-CM | POA: Diagnosis not present

## 2022-03-01 DIAGNOSIS — Z992 Dependence on renal dialysis: Secondary | ICD-10-CM | POA: Diagnosis not present

## 2022-03-02 ENCOUNTER — Encounter: Payer: 59 | Admitting: Physician Assistant

## 2022-03-02 DIAGNOSIS — E1143 Type 2 diabetes mellitus with diabetic autonomic (poly)neuropathy: Secondary | ICD-10-CM | POA: Diagnosis not present

## 2022-03-02 DIAGNOSIS — I2510 Atherosclerotic heart disease of native coronary artery without angina pectoris: Secondary | ICD-10-CM | POA: Diagnosis not present

## 2022-03-02 DIAGNOSIS — E11621 Type 2 diabetes mellitus with foot ulcer: Secondary | ICD-10-CM | POA: Diagnosis not present

## 2022-03-02 DIAGNOSIS — L97522 Non-pressure chronic ulcer of other part of left foot with fat layer exposed: Secondary | ICD-10-CM | POA: Diagnosis not present

## 2022-03-02 DIAGNOSIS — I120 Hypertensive chronic kidney disease with stage 5 chronic kidney disease or end stage renal disease: Secondary | ICD-10-CM | POA: Diagnosis not present

## 2022-03-02 DIAGNOSIS — T25321A Burn of third degree of right foot, initial encounter: Secondary | ICD-10-CM | POA: Diagnosis not present

## 2022-03-02 DIAGNOSIS — Z992 Dependence on renal dialysis: Secondary | ICD-10-CM | POA: Diagnosis not present

## 2022-03-02 DIAGNOSIS — N186 End stage renal disease: Secondary | ICD-10-CM | POA: Diagnosis not present
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
## 2022-03-02 NOTE — Progress Notes (Signed)
MARZELL, ISAKSON ([id number]) Visit Report for 03/02/2022 Arrival Information Details Patient Name: [name], [name]. Date of Service: 03/02/2022 3:00 PM Medical Record Number: [id number] Patient Account Number: [id number] Date of Birth/Sex: [birth year] (51 y.o. M) Treating RN: Cornell Barman Primary Care Tayva Easterday: Harrel Lemon Other Clinician: Massie Kluver Referring Shanekqua Schaper: Harrel Lemon Treating Sheilyn Boehlke/Extender: Skipper Cliche in Treatment: 7 Visit Information History Since Last Visit All ordered tests and consults were completed: No Patient Arrived: Ambulatory Added or deleted any medications: No Arrival Time: 15:14 Any new allergies or adverse reactions: No Transfer Assistance: None Signs or symptoms of abuse/neglect since last visito No Patient Requires Transmission-Based No Precautions: Hospitalized since last visit: No Patient Has Alerts: Yes Pain Present Now: Yes Patient Alerts: Patient on Blood Thinner DIABETIC aspirin 61m Non compressible ABI Righ Electronic Signature(s) Unsigned Entered By: VMassie Kluveron 03/02/2022 15:16:49 Signature(s): Date(s): GJenelle Mages([id number] -------------------------------------------------------------------------------- Lower Extremity Assessment Details Patient Name: [name], [name] Date of Service: 03/02/2022 3:00 PM Medical Record Number: [id number] Account Number: [id number] of Birth/Sex: 118-Aug-1972(51y.o. M) Treating RN: WCornell BarmanPrimary Care Thalia Turkington: JHarrel LemonOther Clinician: VMassie KluverReferring Nealie Mchatton: JHarrel LemonTreating Kaelen Caughlin/Extender: SJeri CosWeeks in Treatment: 7 Edema Assessment Assessed: [Left: Yes] [Right: Yes] Edema: [Left: Yes] [Right: Yes] Calf Left: Right: Point of Measurement: 33 cm From Medial Instep 43 cm 42.6 cm Ankle Left: Right: Point of Measurement: 10 cm From Medial Instep 27 cm 26.8 cm Vascular  Assessment Pulses: Dorsalis Pedis Palpable: [Left:Yes] [Right:Yes] Electronic Signature(s) Unsigned Entered By:Massie Kluveron 03/02/2022 15:41:06 Signature(s): Date(s): GJenelle Mages([id number] -------------------------------------------------------------------------------- Multi Wound Chart Details Patient Name: [name], [name] Date of Service: 03/02/2022 3:00 PM Medical Record Number: [id number] Account Number: [id number] of Birth/Sex: 113-Feb-1972(51y.o. 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 Secondary Etiology: N/A Trauma, Other N/A Comorbid History: Sleep Apnea, Coronary Artery Sleep Apnea, Coronary Artery N/A Disease, Hypertension, Type I Disease, Hypertension, Type I Diabetes, End Stage Renal Disease, Diabetes, End Stage Renal Disease, Osteoarthritis, Neuropathy Osteoarthritis, Neuropathy Date Acquired: 12/05/2021 02/25/2022 N/A Weeks of Treatment: 7 0 N/A Wound Status: Open Open N/A Wound Recurrence: No No N/A Measurements L x W x D (cm) 0.1x0.1x0.1 0.3x1x0.5 N/A Area (cm) : 0.008 0.236 N/A Volume (cm) : 0.001 0.118 N/A % Reduction in Area: 99.70% N/A N/A % Reduction in Volume: 99.60% N/A N/A Classification: Full Thickness Without Exposed Unable to visualize wound bed N/A Support Structures Exudate Amount: Medium Medium N/A Exudate Type: Serosanguineous Serous N/A Exudate Color: red, brown amber  N/A Granulation Amount: Small (1-33%) Small (1-33%) N/A Granulation Quality: Pink Pink N/A Necrotic Amount: Large (67-100%) Small (1-33%) N/A Necrotic Tissue: Eschar Adherent Slough N/A Exposed Structures: Fat Layer (Subcutaneous Tissue): Fat Layer (Subcutaneous Tissue): N/A Yes No Fascia: No Tendon: No Muscle: No Joint: No Bone: No Epithelialization: Medium (34-66%) Small (1-33%) N/A Treatment Notes Electronic Signature(s) URASHOD, GOUGEON([id number] Entered By: VMassie Kluveron 03/02/2022 16:03:52 Signature(s): Date(s): GJenelle Mages([id number] -------------------------------------------------------------------------------- MCornishDetails Patient Name: GBRAILON, [name] Date of Service: 03/02/2022  3:00 PM Medical Record Number: [id number] Patient Account Number: [id number] Date of Birth/Sex: 05/04/1971 (50 y.o. M) Treating RN: Cornell Barman Primary Care Momina Hunton: Harrel Lemon Other Clinician: Massie Kluver Referring Ayline Dingus: Harrel Lemon Treating Dylynn Ketner/Extender: Skipper Cliche in Treatment: 7 Active Inactive Wound/Skin Impairment Nursing Diagnoses: Impaired tissue integrity Knowledge deficit related to smoking impact on wound healing Knowledge deficit related to ulceration/compromised skin integrity Goals: Patient/caregiver will verbalize understanding of skin care regimen Date Initiated: 01/08/2022 Date Inactivated: 01/18/2022 Target Resolution Date: 01/29/2022 Goal Status: Met Ulcer/skin breakdown will have a volume reduction of 30% by week 4 Date Initiated: 01/08/2022 Date Inactivated: 02/09/2022 Target Resolution Date: 02/05/2022 Goal Status: Met Ulcer/skin breakdown will have a volume reduction of 50% by week 8 Date Initiated: 01/08/2022 Date Inactivated: 02/23/2022 Target Resolution Date: 03/05/2022 Goal Status: Met Ulcer/skin breakdown will have a volume reduction of 80% by week 12 Date Initiated:  01/08/2022 Target Resolution Date: 04/02/2022 Goal Status: Active Ulcer/skin breakdown will heal within 14 weeks Date Initiated: 01/08/2022 Target Resolution Date: 04/30/2022 Goal Status: Active Interventions: Assess patient/caregiver ability to obtain necessary supplies Assess patient/caregiver ability to perform ulcer/skin care regimen upon admission and as needed Assess ulceration(s) every visit Notes: Electronic Signature(s) Unsigned Entered By: Massie Kluver on 03/02/2022 16:03:39 Signature(s): Date(s): [name] ([id number]) -------------------------------------------------------------------------------- Pain Assessment Details Patient Name: [name], [name]. Date of Service: 03/02/2022 3:00 PM Medical Record Number: [id number] Patient Account Number: [id number] Date of Birth/Sex: 12-05-1970 (51 y.o. M) Treating RN: Cornell Barman Primary Care Jujuan Dugo: Harrel Lemon Other Clinician: Massie Kluver Referring Shaleigh Laubscher: Harrel Lemon Treating Kirtis Challis/Extender: Skipper Cliche in Treatment: 7 Active Problems Location of Pain Severity and Description of Pain Patient Has Paino Yes Site Locations Pain Location: Pain in Ulcers With Dressing Change: No Duration of the Pain. Constant / Intermittento Intermittent Character of Pain Describe the Pain: Sharp, Shooting Pain Management and Medication Current Pain Management: Rest: Yes How does your wound impact your activities of daily livingo Sleep: No Emotions: Yes Electronic Signature(s) Unsigned Entered ByMassie Kluver on 03/02/2022 15:18:54 Signature(s): Date(s): [name] ([id number]) -------------------------------------------------------------------------------- Wound Assessment Details Patient Name: [name], [name]. Date of Service: 03/02/2022 3:00 PM Medical Record Number: [id number] Patient Account Number: [id number] Date of Birth/Sex: [birth year] (51 y.o. M) Treating RN: Cornell Barman Primary Care Toddy Boyd: Harrel Lemon Other Clinician: Massie Kluver Referring Yuan Gann: Harrel Lemon Treating Kathryne Ramella/Extender: Jeri Cos Weeks in Treatment: 7 Wound Status Wound Number: 3 Primary 3rd degree Burn Etiology: Wound Location: Right, Lateral Toe Fifth Wound Open Wounding Event: Thermal Burn Status: Date Acquired: 12/05/2021 Comorbid Sleep Apnea, Coronary Artery Disease, Hypertension, Weeks Of Treatment: 7 History: Type I Diabetes, End Stage Renal Disease, Osteoarthritis, Clustered Wound: No Neuropathy Photos Wound Measurements Length: (cm) 0.1 Width: (cm) 0.1 Depth: (cm) 0.1 Area: (cm) 0.008 Volume: (cm) 0.001 % Reduction in Area: 99.7% % Reduction in Volume: 99.6% Epithelialization: Medium (34-66%) Tunneling: No Undermining: No Wound Description Classification: Full Thickness Without Exposed Support Structu Exudate Amount: Medium Exudate Type: Serosanguineous Exudate Color: red, brown res Foul Odor After Cleansing: No Slough/Fibrino Yes Wound Bed Granulation Amount: Small (1-33%) Exposed Structure Granulation Quality: Pink Fascia Exposed: No Necrotic Amount: Large (67-100%) Fat Layer (Subcutaneous Tissue) Exposed: Yes Necrotic Quality: Eschar Tendon Exposed: No Muscle Exposed: No Joint Exposed: No Bone Exposed: No Electronic Signature(s) Unsigned Entered ByMassie Kluver on 03/02/2022 15:37:38 Signature(s): Date(s): [name] ([id number]) -------------------------------------------------------------------------------- Wound Assessment Details Patient Name: [name], [name]. Date of Service: 03/02/2022 3:00 PM Medical Record Number: [id number] Patient Account  Number: [id number] Date of Birth/Sex: 1971-07-14 (50 y.o. M) Treating RN: Cornell Barman Primary Care Anadia Helmes: Harrel Lemon Other Clinician: Massie Kluver Referring Camillia Marcy: Harrel Lemon Treating Navjot Loera/Extender: Jeri Cos Weeks in Treatment: 7 Wound  Status Wound Number: 4 Primary Diabetic Wound/Ulcer of the Lower Extremity Etiology: Wound Location: Left, Plantar Toe Great Secondary Trauma, Other Wounding Event: Blister Etiology: Date Acquired: 02/25/2022 Wound Open Weeks Of Treatment: 0 Status: Clustered Wound: No Comorbid Sleep Apnea, Coronary Artery Disease, Hypertension, History: Type I Diabetes, End Stage Renal Disease, Osteoarthritis, Neuropathy Photos Wound Measurements Length: (cm) 0.3 Width: (cm) 1 Depth: (cm) 0.5 Area: (cm) 0.236 Volume: (cm) 0.118 % Reduction in Area: % Reduction in Volume: Epithelialization: Small (1-33%) Tunneling: No Undermining: No Wound Description Classification: Unable to visualize wound bed Exudate Amount: Medium Exudate Type: Serous Exudate Color: amber Foul Odor After Cleansing: No Slough/Fibrino Yes Wound Bed Granulation Amount: Small (1-33%) Exposed Structure Granulation Quality: Pink Fat Layer (Subcutaneous Tissue) Exposed: No Necrotic Amount: Small (1-33%) Necrotic Quality: Adherent Therapist, music) Unsigned Entered By: Massie Kluver on 03/02/2022 15:36:41 Signature(s): Date(s): Matthew Nan D. ([id number]) -------------------------------------------------------------------------------- Vitals Details Patient Name: Matthew Nan D. Date of Service: 03/02/2022 3:00 PM Medical Record Number: [id number] Patient Account Number: [id number] Date of Birth/Sex: [birth year] (51 y.o. M) Treating RN: Cornell Barman Primary Care Caitlen Worth: Harrel Lemon Other Clinician: Massie Kluver Referring Roshanda Balazs: Harrel Lemon Treating Francesco Provencal/Extender: Skipper Cliche in Treatment: 7 Vital Signs Time Taken: 15:10 Temperature (F): 98.2 Height (in): 71 Pulse (bpm): 75 Weight (lbs): 141 Respiratory Rate (breaths/min): 18 Body Mass Index (BMI): 19.7 Blood Pressure (mmHg): 156/69 Reference Range: 80 - 120 mg / dl Electronic Signature(s) Unsigned Entered  ByMassie Kluver on 03/02/2022 15:17:38 Signature(s): Date(s):

## 2022-03-03 DIAGNOSIS — N186 End stage renal disease: Secondary | ICD-10-CM | POA: Diagnosis not present

## 2022-03-03 DIAGNOSIS — Z992 Dependence on renal dialysis: Secondary | ICD-10-CM | POA: Diagnosis not present

## 2022-03-04 DIAGNOSIS — N186 End stage renal disease: Secondary | ICD-10-CM | POA: Diagnosis not present

## 2022-03-04 DIAGNOSIS — Z992 Dependence on renal dialysis: Secondary | ICD-10-CM | POA: Diagnosis not present

## 2022-03-05 DIAGNOSIS — N186 End stage renal disease: Secondary | ICD-10-CM | POA: Diagnosis not present

## 2022-03-05 DIAGNOSIS — Z992 Dependence on renal dialysis: Secondary | ICD-10-CM | POA: Diagnosis not present

## 2022-03-06 DIAGNOSIS — N186 End stage renal disease: Secondary | ICD-10-CM | POA: Diagnosis not present

## 2022-03-06 DIAGNOSIS — Z992 Dependence on renal dialysis: Secondary | ICD-10-CM | POA: Diagnosis not present

## 2022-03-07 DIAGNOSIS — N186 End stage renal disease: Secondary | ICD-10-CM | POA: Diagnosis not present

## 2022-03-07 DIAGNOSIS — Z992 Dependence on renal dialysis: Secondary | ICD-10-CM | POA: Diagnosis not present

## 2022-03-08 ENCOUNTER — Ambulatory Visit: Payer: 59 | Admitting: Physician Assistant

## 2022-03-08 ENCOUNTER — Ambulatory Visit: Payer: 59 | Admitting: Endocrinology

## 2022-03-08 DIAGNOSIS — Z992 Dependence on renal dialysis: Secondary | ICD-10-CM | POA: Diagnosis not present

## 2022-03-08 DIAGNOSIS — N186 End stage renal disease: Secondary | ICD-10-CM | POA: Diagnosis not present

## 2022-03-08 NOTE — Progress Notes (Deleted)
Patient ID: [name], male   DOB: July 25, 1971, 51 y.o.   MRN: [id number]           Reason for Appointment : Follow-up for Type 1 Diabetes  History of Present Illness           Diagnosis: Type 1 diabetes mellitus, date of diagnosis:  1997        Previous history:  He has been variously on insulin injections and insulin pumps since his diagnosis He has been on insulin pump on and off for the last few years with fair control  Recent history:   INSULIN pump settings  Active basal rate #1: 12 AM = 0.3, 3 AM = 1.0, from 6 AM = 1.25, 10 AM = 1.50 and 8 PM = 1.3  Carbohydrate coverage 1: 7 at breakfast and suppertime and 1: 8 at lunch  Sensitivity 1: 40  Current target 110 and active insulin 5 hours  A1c is last 9.2   Current management, blood sugar patterns and problems identified:   He is using the t-slim pump since afternoon of 12/23/2021  Currently having some issues with the glucose not bearing with his pump and the connection falling off consistently His correction factor was changed during the daytime because of getting some excessive correction during the day but more recently has not had any low sugars even overnight  Current blood sugar patterns are as follows OVERNIGHT blood sugars are excellent on the first 2 days with only mild increase around 1-2 AM the first 2 nights but the last 3 nights her blood sugars are brought up to between 200 and 350 With taking a correction bolus when his blood sugar was 170 and this gradually improved his sugar last night POSTPRANDIAL readings are somewhat variable but appear to be mostly going higher to over 200 or more especially after lunch and 1 with entering only about 20-25 g of carbohydrate Evening blood sugars have been variably high after dinner and may be low normal the last 2 nights when he says his intake was less but he still put in 35 g He may sometimes have different strength of dialysate for his dialysis  overnight  Bolus history: He is trying to take his meal time boluses more consistently at the time of his meal but may not be until blood sugars have started to go up a little because of continued fear of hypoglycemia  He is also going to be putting carbohydrates for snacks  Recently on some of the nights he has taken correction boluses for high blood sugars with inadequate response during the night    Recent CGM average 165 on his new pump  PREVIOUS CGM data: GMI 7.8  CGM use % of time 96  2-week average/GV 189  Time in range       46 %  % Time Above 180 28  % Time above 250 23  % Time Below 70 3     PRE-MEAL Fasting Lunch Dinner Bedtime Overall  Glucose range:       Averages: 170  180 185    POST-MEAL PC Breakfast PC Lunch PC Dinner  Glucose range:     Averages: ? ? 187     Self-care: Typical breakfast will be toast, eggs and bacon Usually lunch will be chicken, pasta and green beans Typical dinner: Steak asparagus and corn Snacks peanut butter crackers and chips  Dietician consultation: Most recent: none.         CDE consultation: Years ago  Diabetes labs:  Lab Results  Component Value Date   HGBA1C 9.2 (A) 12/11/2021   HGBA1C 9.2 (A) 10/02/2021   HGBA1C 8.6 (H) 02/10/2021   Lab Results  Component Value Date   MICROALBUR 93.4 (H) 02/10/2021   LDLCALC 95 02/10/2021   CREATININE 14.09 (H) 10/13/2021    Lab Results  Component Value Date   MICRALBCREAT 156.1 (H) 02/10/2021     Allergies as of 03/08/2022       Reactions   Sulfa Antibiotics Anaphylaxis, Swelling   Pt is not allergic to iodine, has had iodine in the past w/o premeds and w/ no problems   Oxycontin [oxycodone Hcl] Nausea And Vomiting   Penicillins Other (See Comments)   Possible reaction many years ago per patient (Pt reports he has had pcn without issues since time of "reaction") Other reaction(s): Unknown   Prednisone Other (See Comments)   Patient is diabetic, runs  sugar up   Shellfish-derived Products Swelling   Eyes swell with shellfish Pt is not allergic to iodine, has had iodine in the past w/o premeds and w/ no problem   Pregabalin Other (See Comments)   Muscle twitching and jerks        Medication List        Accurate as of March 08, 2022  3:46 PM. If you have any questions, ask your nurse or doctor.          acetaminophen 500 MG tablet Commonly known as: TYLENOL Take 500-1,000 mg by mouth every 6 (six) hours as needed for mild pain or fever.   amLODipine 10 MG tablet Commonly known as: NORVASC Take by mouth.   amLODipine 10 MG tablet Commonly known as: NORVASC TAKE 1 TABLET BY MOUTH ONCE DAILY   aspirin EC 81 MG tablet Take 81 mg by mouth daily. Swallow whole.   atorvastatin 80 MG tablet Commonly known as: LIPITOR TAKE 1 TABLET BY MOUTH ONCE DAILY What changed: how much to take   atorvastatin 80 MG tablet Commonly known as: LIPITOR TAKE 1 TABLET BY MOUTH ONCE DAILY What changed: Another medication with the same name was changed. Make sure you understand how and when to take each.   Auryxia 1 GM 210 MG(Fe) tablet Generic drug: ferric citrate Take 3 tablets by mouth 3 times a day with a meal   carvedilol 25 MG tablet Commonly known as: COREG Take one tablet by mouth 2 times daily with meals   cinacalcet 60 MG tablet Commonly known as: SENSIPAR Take 1 tablet (60 mg total) by mouth daily.   cinacalcet 60 MG tablet Commonly known as: SENSIPAR Take 2 tablets by mouth once a day   cloNIDine 0.1 MG tablet Commonly known as: CATAPRES Take 0.1 mg by mouth 2 (two) times daily.   cloNIDine 0.1 MG tablet Commonly known as: CATAPRES Take one tablet by mouth 3 times daily   Dexcom G6 Transmitter Misc Change every 3 months   FreeStyle Libre 3 Sensor Misc Apply 1 sensor on upper arm every 14 days for continuous glucose monitoring   Dexcom G6 Sensor Misc Change every 10 days   gentamicin cream 0.1 % Commonly  known as: GARAMYCIN Apply pea size amount to exit size daily. What changed:  how much to take when to take this reasons to take this   Glucagon Emergency 1 MG Kit Inject into the muscle as directed for severe  sugar.   HumaLOG 100 UNIT/ML injection Generic drug: insulin lispro Use up to 100 units total via insulin pump daily   HYDROcodone-acetaminophen 5-325 MG tablet Commonly known as: NORCO/VICODIN Take 1 tablet by mouth every 8 (eight) hours as needed for moderate pain.   losartan 100 MG tablet Commonly known as: COZAAR TAKE 1 TABLET BY MOUTH ONCE DAILY   losartan 100 MG tablet Commonly known as: COZAAR TAKE 1 TABLET BY MOUTH ONCE DAILY   ondansetron 4 MG tablet Commonly known as: Zofran Take 1 tablet by mouth every 8 hours as needed for nausea or vomiting.   oxyCODONE-acetaminophen 5-325 MG tablet Commonly known as: Percocet Take 1 tablet by mouth every 6 hours   pregabalin 75 MG capsule Commonly known as: Lyrica Take 1 capsule (75 mg total) by mouth daily.   Silvadene 1 % cream Generic drug: silver sulfADIAZINE Apply to affected area once daily   torsemide 100 MG tablet Commonly known as: DEMADEX Take 1 tablet by mouth once a day   Velphoro 500 MG chewable tablet Generic drug: sucroferric oxyhydroxide Chew 2 tablets by mouth 3 times a day with food        Allergies:  Allergies  Allergen Reactions   Sulfa Antibiotics Anaphylaxis and Swelling    Pt is not allergic to iodine, has had iodine in the past w/o premeds and w/ no problems   Oxycontin [Oxycodone Hcl] Nausea And Vomiting   Penicillins Other (See Comments)    Possible reaction many years ago per patient (Pt reports he has had pcn without issues since time of "reaction") Other reaction(s): Unknown   Prednisone Other (See Comments)    Patient is diabetic, runs sugar up     Shellfish-Derived Products Swelling    Eyes swell with shellfish Pt is not allergic to iodine, has had iodine in the  past w/o premeds and w/ no problem   Pregabalin Other (See Comments)    Muscle twitching and jerks    Past Medical History:  Diagnosis Date   ESRD (end stage renal disease) ([place])    Gastroparesis    Herniated cervical disc    HTN (hypertension)    Hypercholesteremia    Morbid obesity (HCC)    OSA (obstructive sleep apnea)    Peritoneal dialysis status ([facility])    PONV (postoperative nausea and vomiting)    after peritoneal dialysis catheter was placed   S/P cardiac cath    a. 10-15 yrs ago at [facility] due to tachycardia, reportedly normal. b. Normal ETT 03/2012.   Sinus tachycardia    a. 24-hr Holter 03/2012 - SR, occ PVCs, no VT, avg HR 96bpm.   TIA (transient ischemic attack)    Type 1 diabetes mellitus ([place])    a. With insulin pump.    Past Surgical History:  Procedure Laterality Date   BIOPSY  10/03/2019   Procedure: BIOPSY;  Surgeon: Mauri Pole, MD;  Location: [facility];  Service: Endoscopy;;   cervical neck fusion     COLONOSCOPY WITH PROPOFOL N/A 01/02/2021   Procedure: COLONOSCOPY WITH PROPOFOL;  Surgeon: [name], MD;  Location: [place];  Service: Endoscopy;  Laterality: N/A;  priority 4 DIABETIC needs potassium draw   ESOPHAGOGASTRODUODENOSCOPY (EGD) WITH PROPOFOL N/A 10/03/2019   Procedure: ESOPHAGOGASTRODUODENOSCOPY (EGD) WITH PROPOFOL;  Surgeon: Mauri Pole, MD;  Location: [facility];  Service: Endoscopy;  Laterality: N/A;   HERNIA REPAIR     bilateral   LEFT HEART CATH AND CORONARY ANGIOGRAPHY Left 10/16/2021  Procedure: LEFT HEART CATH AND CORONARY ANGIOGRAPHY;  Surgeon: Wellington Hampshire, MD;  Location: [facility];  Service: Cardiovascular;  Laterality: Left;   LEFT HEART CATHETERIZATION WITH CORONARY ANGIOGRAM N/A 12/18/2013   Procedure: LEFT HEART CATHETERIZATION WITH CORONARY ANGIOGRAM;  Surgeon: [name] M Martinique, MD;  Location: [facility];  Service: Cardiovascular;  Laterality: N/A;   POLYPECTOMY  01/02/2021   Procedure:  POLYPECTOMY;  Surgeon: [name], MD;  Location: [facility];  Service: Endoscopy;;    Family History  Problem Relation Age of Onset   Hypertension Other    Coronary artery disease Other        Mother's side - both her side's grandparents died of heart disease (MIs)   Diabetes Other    Stroke Other        Paternal grandfather (82)   Prostate cancer Neg Hx    Bladder Cancer Neg Hx    Kidney cancer Neg Hx     Social History:  reports that he has never smoked. He has never used smokeless tobacco. He reports that he does not drink alcohol and does not use drugs.      Review of Systems      Lipids: Has been treated with 80 mg atorvastatin, last labs as follows  Lab Results  Component Value Date   CHOL 178 02/10/2021   HDL 53.40 02/10/2021   LDLCALC 95 02/10/2021   TRIG 145.0 02/10/2021   CHOLHDL 3 02/10/2021    Last dilated eye exam was in 61/53  DIABETES COMPLICATIONS: Nephropathy, diabetic foot ulcers  NEUROPATHY, peripheral: Periodically will have a lot of painful sensations in his legs from neuropathy with tingling and sharp pains.   However gabapentin that was given makes him feel bad and he is not taking this Symptoms are intermittent and less prominent now Has not taken Lyrica because of nausea with this  NAUSEA: He has had persistent nausea for about 3 months, currently taking Zofran as needed  Hypertension: On multiple drugs including Norvasc, losartan and clonidine followed by nephrology   Physical Examination:  There were no vitals taken for this visit.   ASSESSMENT:  Diabetes type 1, poorly controlled  A1c is last 9.2  He is using the T-slim pump for the last 5 days  Overall control is better with much less hypoglycemia tendency but still having some periods of hyperglycemia Despite this his blood sugars are still 64% within target since starting the pump  Basal rate settings now: 12 AM = 0.6, 2 AM = 1 point 5 and 4 AM =  1.0   Carbohydrate ratio was changed at 1: 6 at lunchtime instead of 1: 8 Correction factor I: 30 5 in the afternoon Correction: 1: 30 overnight Sleep mode was turned off in the office continue doing correction boluses if blood sugars are unexpectedly high We will again try to be more consistent with bolusing at a time Try to be more accurate and estimating carbohydrates when he is eating and if need be bolus right after finishing eating  There are no Patient Instructions on file for this visit.   Total visit time for evaluation and management and counseling = 30 minutes  [name] 03/08/2022, 3:46 PM     Note: This note was prepared with Dragon voice recognition system technology. Any transcriptional errors that result from this process are unintentional.

## 2022-03-09 ENCOUNTER — Encounter: Payer: 59 | Attending: Physician Assistant | Admitting: Physician Assistant

## 2022-03-09 DIAGNOSIS — N186 End stage renal disease: Secondary | ICD-10-CM | POA: Diagnosis not present

## 2022-03-09 DIAGNOSIS — I2510 Atherosclerotic heart disease of native coronary artery without angina pectoris: Secondary | ICD-10-CM | POA: Diagnosis not present

## 2022-03-09 DIAGNOSIS — L97522 Non-pressure chronic ulcer of other part of left foot with fat layer exposed: Secondary | ICD-10-CM | POA: Diagnosis not present

## 2022-03-09 DIAGNOSIS — E1122 Type 2 diabetes mellitus with diabetic chronic kidney disease: Secondary | ICD-10-CM | POA: Insufficient documentation

## 2022-03-09 DIAGNOSIS — E1143 Type 2 diabetes mellitus with diabetic autonomic (poly)neuropathy: Secondary | ICD-10-CM | POA: Insufficient documentation

## 2022-03-09 DIAGNOSIS — E11621 Type 2 diabetes mellitus with foot ulcer: Secondary | ICD-10-CM | POA: Diagnosis not present

## 2022-03-09 DIAGNOSIS — I120 Hypertensive chronic kidney disease with stage 5 chronic kidney disease or end stage renal disease: Secondary | ICD-10-CM | POA: Diagnosis not present

## 2022-03-09 DIAGNOSIS — Z992 Dependence on renal dialysis: Secondary | ICD-10-CM | POA: Diagnosis not present

## 2022-03-09 DIAGNOSIS — T25221A Burn of second degree of right foot, initial encounter: Secondary | ICD-10-CM | POA: Diagnosis not present
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
## 2022-03-09 NOTE — Progress Notes (Signed)
[name], [name] ([id number]) Visit Report for 03/09/2022 Arrival Information Details Patient Name: [name], [name]. Date of Service: 03/09/2022 8:00 AM Medical Record Number: [id number] Patient Account Number: [id number] Date of Birth/Sex: July 27, 1971 (51 y.o. M) Treating RN: Carlene Coria Primary Care Dorathy Stallone: Harrel Lemon Other Clinician: Referring Marua Qin: Harrel Lemon Treating Lina Hitch/Extender: Skipper Cliche in Treatment: 8 Visit Information History Since Last Visit Added or deleted any medications: No Patient Arrived: Ambulatory Any new allergies or adverse reactions: No Arrival Time: 08:11 Had a fall or experienced change in No Transfer Assistance: None activities of daily living that may affect Patient Requires Transmission-Based No risk of falls: Precautions: Hospitalized since last visit: No Patient Has Alerts: Yes Pain Present Now: No Patient Alerts: Patient on Blood Thinner DIABETIC aspirin 95m Non compressible ABI Righ Electronic Signature(s) Signed: 03/09/2022 4:20:44 PM By: VMassie KluverEntered By: VMassie Kluveron 03/09/2022 08:16:18 Matthew NanD. ([id number] -------------------------------------------------------------------------------- Clinic Level of Care Assessment Details Patient Name: Matthew NanD. Date of Service: 03/09/2022 8:00 AM Medical Record Number: [id number] Account Number: [id number] of Birth/Sex: 122-Mar-1972(51y.o. M) Treating RN: ECarlene CoriaPrimary Care Joesph Marcy: JHarrel LemonOther Clinician: Referring Yuette Putnam: JHarrel LemonTreating Demonica Farrey/Extender: SSkipper Clichein Treatment: 8 Clinic Level of Care Assessment Items TOOL 1 Quantity Score []  - Use when EandM and Procedure is performed on INITIAL visit 0 ASSESSMENTS - Nursing Assessment / Reassessment []  - General Physical Exam (combine w/ comprehensive assessment (listed just below) when performed on new 0 pt. evals) []  -  0 Comprehensive Assessment (HX, ROS, Risk Assessments, Wounds Hx, etc.) ASSESSMENTS - Wound and Skin Assessment / Reassessment []  - Dermatologic / Skin Assessment (not related to wound area) 0 ASSESSMENTS - Ostomy and/or Continence Assessment and Care []  - Incontinence Assessment and Management 0 []  - 0 Ostomy Care Assessment and Management (repouching, etc.) PROCESS - Coordination of Care []  - Simple Patient / Family Education for ongoing care 0 []  - 0 Complex (extensive) Patient / Family Education for ongoing care []  - 0 Staff obtains CProgrammer, systems Records, Test Results / Process Orders []  - 0 Staff telephones HHA, Nursing Homes / Clarify orders / etc []  - 0 Routine Transfer to another Facility (non-emergent condition) []  - 0 Routine Hospital Admission (non-emergent condition) []  - 0 New Admissions / IBiomedical engineer/ Ordering NPWT, Apligraf, etc. []  - 0 Emergency Hospital Admission (emergent condition) PROCESS - Special Needs []  - Pediatric / Minor Patient Management 0 []  - 0 Isolation Patient Management []  - 0 Hearing / Language / Visual special needs []  - 0 Assessment of Community assistance (transportation, D/C planning, etc.) []  - 0 Additional assistance / Altered mentation []  - 0 Support Surface(s) Assessment (bed, cushion, seat, etc.) INTERVENTIONS - Miscellaneous []  - External ear exam 0 []  - 0 Patient Transfer (multiple staff / HCivil Service fast streamer/ Similar devices) []  - 0 Simple Staple / Suture removal (25 or less) []  - 0 Complex Staple / Suture removal (26 or more) []  - 0 Hypo/Hyperglycemic Management (do not check if billed separately) []  - 0 Ankle / Brachial Index (ABI) - do not check if billed separately Has the patient been seen at the hospital within the last three years: Yes Total Score: 0 Level Of Care: ____ GJenelle Mages([id number] Electronic Signature(s) Signed: 03/09/2022 4:20:44 PM By: VMassie KluverEntered By: VMassie Kluveron  03/09/2022 09:05:43 Matthew NanD. ([id number] -------------------------------------------------------------------------------- Encounter Discharge Information Details Patient Name: Matthew NanD. Date of Service: 03/09/2022 8:00 AM  Medical Record Number: [id number] Patient Account Number: [id number] Date of Birth/Sex: 1970/12/18 (51 y.o. M) Treating RN: Carlene Coria Primary Care Toshi Ishii: Harrel Lemon Other Clinician: Referring Wanetta Funderburke: Harrel Lemon Treating Regine Christian/Extender: Skipper Cliche in Treatment: 8 Encounter Discharge Information Items Post Procedure Vitals Discharge Condition: Stable Temperature (F): 98.1 Ambulatory Status: Ambulatory Pulse (bpm): 98 Discharge Destination: Home Respiratory Rate (breaths/min): 18 Transportation: Private Auto Blood Pressure (mmHg): 216/66 Schedule Follow-up Appointment: Yes Clinical Summary of Care: Electronic Signature(s) Signed: 03/09/2022 4:20:44 PM By: Massie Kluver Entered By: Massie Kluver on 03/09/2022 09:08:23 Matthew Nan D. ([id number]) -------------------------------------------------------------------------------- Lower Extremity Assessment Details Patient Name: Matthew Nan D. Date of Service: 03/09/2022 8:00 AM Medical Record Number: [id number] Patient Account Number: [id number] Date of Birth/Sex: 1971/08/08 (51 y.o. M) Treating RN: Carlene Coria Primary Care Cyleigh Massaro: Harrel Lemon Other Clinician: Referring Dezi Schaner: Harrel Lemon Treating Haynes Giannotti/Extender: Jeri Cos Weeks in Treatment: 8 Edema Assessment Assessed: [Left: No] [Right: No] Edema: [Left: Yes] [Right: Yes] Calf Left: Right: Point of Measurement: 33 cm From Medial Instep 42 cm 41.8 cm Ankle Left: Right: Point of Measurement: 10 cm From Medial Instep 25.5 cm 25.6 cm Vascular Assessment Pulses: Dorsalis Pedis Palpable: [Left:Yes] [Right:Yes] Electronic Signature(s) Signed: 03/09/2022 4:20:44 PM By: Massie Kluver Signed: 03/09/2022 5:00:48 PM By: Carlene Coria RN Entered By: Massie Kluver on 03/09/2022 08:31:21 [name], Matthew D. ([id number]) -------------------------------------------------------------------------------- Multi Wound Chart Details Patient Name: Matthew Nan D. Date of Service: 03/09/2022 8:00 AM Medical Record Number: [id number] Patient Account Number: [id number] Date of Birth/Sex: 08/14/1971 (51 y.o. 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 Secondary Etiology: N/A Trauma, Other N/A Comorbid History: Sleep Apnea, Coronary Artery Sleep Apnea, Coronary Artery N/A Disease, Hypertension, Type I Disease, Hypertension, Type I Diabetes, End Stage Renal Disease, Diabetes, End Stage Renal Disease, Osteoarthritis, Neuropathy Osteoarthritis, Neuropathy Date Acquired: 12/05/2021 02/25/2022 N/A Weeks of Treatment: 8 1 N/A Wound Status: Healed - Epithelialized Open N/A Wound Recurrence: No No N/A Measurements L x W x D (cm) 0x0x0 0.4x0.5x0.1 N/A Area (cm) : 0 0.157 N/A Volume (cm) : 0 0.016 N/A % Reduction in Area: 100.00% 33.50% N/A % Reduction in Volume: 100.00% 86.40% N/A Classification: Full Thickness Without Exposed Unable to visualize wound bed N/A Support Structures Exudate Amount: None Present Medium N/A Exudate Type: N/A Serous N/A Exudate Color: N/A amber N/A Granulation Amount: Large (67-100%) Small (1-33%) N/A Granulation Quality: N/A Pink  N/A Necrotic Amount: N/A Small (1-33%) N/A Epithelialization: N/A Small (1-33%) N/A Treatment Notes Electronic Signature(s) Signed: 03/09/2022 4:20:44 PM By: Massie Kluver Entered By: Massie Kluver on 03/09/2022 08:31:38 Matthew Mages ([id number]) -------------------------------------------------------------------------------- Multi-Disciplinary Care Plan Details Patient Name: Matthew Nan D. Date of Service: 03/09/2022 8:00 AM Medical Record Number: [id number] Patient Account Number: [id number] Date of Birth/Sex: 01/04/1971 (51 y.o. M) Treating RN: Carlene Coria Primary Care Graceson Nichelson: Harrel Lemon Other Clinician: Referring Tommey Barret: Harrel Lemon Treating Jaxx Huish/Extender: Skipper Cliche in Treatment: 8 Active Inactive Wound/Skin Impairment Nursing Diagnoses: Impaired tissue integrity Knowledge deficit related to smoking impact on wound healing Knowledge deficit related to ulceration/compromised skin integrity Goals: Patient/caregiver will verbalize understanding of skin care regimen Date Initiated:  01/08/2022 Date Inactivated: 01/18/2022 Target Resolution Date: 01/29/2022 Goal Status: Met Ulcer/skin breakdown will have a volume reduction of 30% by week 4 Date Initiated: 01/08/2022 Date Inactivated: 02/09/2022 Target Resolution Date: 02/05/2022 Goal Status: Met Ulcer/skin breakdown will have a volume reduction of 50% by week 8 Date Initiated: 01/08/2022 Date Inactivated: 02/23/2022 Target Resolution Date: 03/05/2022 Goal Status: Met Ulcer/skin breakdown will have a volume reduction of 80% by week 12 Date Initiated: 01/08/2022 Target Resolution Date: 04/02/2022 Goal Status: Active Ulcer/skin breakdown will heal within 14 weeks Date Initiated: 01/08/2022 Target Resolution Date: 04/30/2022 Goal Status: Active Interventions: Assess patient/caregiver ability to obtain necessary supplies Assess patient/caregiver ability to perform ulcer/skin care regimen upon admission and as  needed Assess ulceration(s) every visit Notes: Electronic Signature(s) Signed: 03/09/2022 4:20:44 PM By: Massie Kluver Signed: 03/09/2022 5:00:48 PM By: Carlene Coria RN Entered By: Massie Kluver on 03/09/2022 08:31:27 Matthew Nan D. ([id number]) -------------------------------------------------------------------------------- Pain Assessment Details Patient Name: Matthew Nan D. Date of Service: 03/09/2022 8:00 AM Medical Record Number: [id number] Patient Account Number: [id number] Date of Birth/Sex: 1971-05-14 (51 y.o. M) Treating RN: Carlene Coria Primary Care Nikiah Goin: Harrel Lemon Other Clinician: Referring Arilla Hice: Harrel Lemon Treating Tonie Elsey/Extender: Skipper Cliche in Treatment: 8 Active Problems Location of Pain Severity and Description of Pain Patient Has Paino No Site Locations Pain Management and Medication Current Pain Management: Electronic Signature(s) Signed: 03/09/2022 4:20:44 PM By: Massie Kluver Signed: 03/09/2022 5:00:48 PM By: Carlene Coria RN Entered By: Massie Kluver on 03/09/2022 08:19:01 Matthew Nan D. ([id number]) -------------------------------------------------------------------------------- Patient/Caregiver Education Details Patient Name: Matthew Mages. Date of Service: 03/09/2022 8:00 AM Medical Record Number: [id number] Patient Account Number: [id number] Date of Birth/Gender: 1970-11-24 (51 y.o. M) Treating RN: Carlene Coria Primary Care Physician: Harrel Lemon Other Clinician: Referring Physician: Harrel Lemon Treating Physician/Extender: Skipper Cliche in Treatment: 8 Education Assessment Education Provided To: Patient Education Topics Provided Wound/Skin Impairment: Handouts: Other: continue wound care as directed Methods: Explain/Verbal Responses: State content correctly Electronic Signature(s) Signed: 03/09/2022 4:20:44 PM By: Massie Kluver Entered By: Massie Kluver on 03/09/2022 09:06:49 Matthew Nan D. ([id number]) -------------------------------------------------------------------------------- Wound Assessment Details Patient Name: Matthew Nan D. Date of Service: 03/09/2022 8:00 AM Medical Record Number: [id number] Patient Account Number: [id number] Date of Birth/Sex: 03-Apr-1971 (51 y.o. M) Treating RN: Carlene Coria Primary Care Benny Deutschman: Harrel Lemon Other Clinician: Referring Shaindy Reader: Harrel Lemon Treating Arali Somera/Extender: Jeri Cos Weeks in Treatment: 8 Wound Status Wound Number: 3 Primary 3rd degree Burn Etiology: Wound Location: Right, Lateral Toe Fifth Wound Healed - Epithelialized Wounding Event: Thermal Burn Status: Date Acquired: 12/05/2021 Comorbid Sleep Apnea, Coronary Artery Disease, Hypertension, Weeks Of Treatment: 8 History: Type I Diabetes, End Stage Renal Disease, Osteoarthritis, Clustered Wound: No Neuropathy Photos Wound Measurements Length: (cm) 0 Width: (cm) 0 Depth: (cm) 0 Area: (cm) 0 Volume: (cm) 0 % Reduction in Area: 100% % Reduction in Volume: 100% Wound Description Classification: Full Thickness Without Exposed Support Structure Exudate Amount: None Present s Foul Odor After Cleansing: No Wound Bed Granulation Amount: Large (67-100%) Treatment Notes Wound #3 (Toe Fifth) Wound Laterality: Right, Lateral Cleanser Peri-Wound Care Topical Primary Dressing Secondary Dressing Secured With Compression Wrap Compression Stockings KIYAN, [name] ([id number]) Add-Ons Electronic Signature(s) Signed: 03/09/2022 4:20:44 PM By: Massie Kluver Signed: 03/09/2022 5:00:48 PM By: Carlene Coria RN Entered By: Massie Kluver on 03/09/2022 08:37:16 [name], Matthew D. ([id number]) -------------------------------------------------------------------------------- Wound Assessment Details Patient Name: Matthew Nan D. Date of Service: 03/09/2022 8:00 AM Medical Record Number: [id number] Patient Account Number:  [id number] Date of Birth/Sex: 06/08/1971 (  51 y.o. M) Treating RN: Carlene Coria Primary Care Zoe Goonan: Harrel Lemon Other Clinician: Referring Nairi Oswald: Harrel Lemon Treating Pratyush Ammon/Extender: Jeri Cos Weeks in Treatment: 8 Wound Status Wound Number: 4 Primary Diabetic Wound/Ulcer of the Lower Extremity Etiology: Wound Location: Left, Plantar Toe Great Secondary Trauma, Other Wounding Event: Blister Etiology: Date Acquired: 02/25/2022 Wound Open Weeks Of Treatment: 1 Status: Clustered Wound: No Comorbid Sleep Apnea, Coronary Artery Disease, Hypertension, History: Type I Diabetes, End Stage Renal Disease, Osteoarthritis, Neuropathy Photos Wound Measurements Length: (cm) 0.4 Width: (cm) 0.5 Depth: (cm) 0.1 Area: (cm) 0.157 Volume: (cm) 0.016 % Reduction in Area: 33.5% % Reduction in Volume: 86.4% Epithelialization: Small (1-33%) Tunneling: No Undermining: No Wound Description Classification: Unable to visualize wound bed Exudate Amount: Medium Exudate Type: Serous Exudate Color: amber Foul Odor After Cleansing: No Slough/Fibrino Yes Wound Bed Granulation Amount: Small (1-33%) Exposed Structure Granulation Quality: Pink Fat Layer (Subcutaneous Tissue) Exposed: No Necrotic Amount: Small (1-33%) Necrotic Quality: Adherent Slough Treatment Notes Wound #4 (Toe Great) Wound Laterality: Plantar, Left Cleanser Normal Saline Discharge Instruction: Wash your hands with soap and water. Remove old dressing, discard into plastic bag and place into trash. Cleanse the wound with Normal Saline prior to applying a clean dressing using gauze sponges, not tissues or cotton balls. Do not scrub or use excessive force. Pat dry using gauze sponges, not tissue or cotton balls. Wound Cleanser [name], [name] ([id number]) Discharge Instruction: Wash your hands with soap and water. Remove old dressing, discard into plastic bag and place into trash. Cleanse the wound with Wound  Cleanser prior to applying a clean dressing using gauze sponges, not tissues or cotton balls. Do not scrub or use excessive force. Pat dry using gauze sponges, not tissue or cotton balls. Peri-Wound Care Topical Primary Dressing Prisma 4.34 (in) Discharge Instruction: Moisten w/normal saline or sterile water; Cover wound as directed. Do not remove from wound bed. Secondary Dressing Foam Dressing, 4x4 (in/in) Secured With Medipore Tape - 32M Medipore H Soft Cloth Surgical Tape, 2x2 (in/yd) Compression Wrap Compression Stockings Add-Ons Electronic Signature(s) Signed: 03/09/2022 4:20:44 PM By: Massie Kluver Signed: 03/09/2022 5:00:48 PM By: Carlene Coria RN Entered By: Massie Kluver on 03/09/2022 08:27:53 [name], Matthew D. ([id number]) -------------------------------------------------------------------------------- Wound Assessment Details Patient Name: [name], Matthew D. Date of Service: 03/09/2022 8:00 AM Medical Record Number: [id number] Patient Account Number: [id number] Date of Birth/Sex: 09/10/1971 (51 y.o. M) Treating RN: Carlene Coria Primary Care Blanche Scovell: Harrel Lemon Other Clinician: Referring Danyella Mcginty: Harrel Lemon Treating Kennth Vanbenschoten/Extender: Jeri Cos Weeks in Treatment: 8 Wound Status Wound Number: 5 Primary Diabetic Wound/Ulcer of the Lower Extremity Etiology: Wound Location: Right, Distal Toe Great Wound Open Wounding Event: Shear/Friction Status: Date Acquired: 03/07/2022 Comorbid Sleep Apnea, Coronary Artery Disease, Hypertension, Weeks Of Treatment: 0 History: Type I Diabetes, End Stage Renal Disease, Osteoarthritis, Clustered Wound: No Neuropathy Photos Wound Measurements Length: (cm) 1 Width: (cm) 2.1 Depth: (cm) 0.1 Area: (cm) 1.649 Volume: (cm) 0.165 % Reduction in Area: % Reduction in Volume: Epithelialization: None Wound Description Classification: Grade 1 Exudate Amount: None Present Wound Bed Exposed Structure Fascia Exposed:  No Fat Layer (Subcutaneous Tissue) Exposed: No Tendon Exposed: No Muscle Exposed: No Joint Exposed: No Bone Exposed: No Limited to Skin Breakdown Treatment Notes Wound #5 (Toe Great) Wound Laterality: Right, Distal Cleanser Peri-Wound Care Topical Betadine Discharge Instruction: Apply betadine as directed. [name], [name] ([id number]) Primary Dressing Gauze Discharge Instruction: As directed: dry, moistened with saline or moistened with Dakins Solution Secondary Dressing Secured With [place]  Medipore H Soft Cloth Surgical Tape, 2x2 (in/yd) Compression Wrap Compression Stockings Add-Ons Electronic Signature(s) Signed: 03/09/2022 4:20:44 PM By: Massie Kluver Signed: 03/09/2022 5:00:48 PM By: Carlene Coria RN Entered By: Massie Kluver on 03/09/2022 09:02:01 [name], Matthew D. ([id number]) -------------------------------------------------------------------------------- Vitals Details Patient Name: Matthew Nan D. Date of Service: 03/09/2022 8:00 AM Medical Record Number: [id number] Patient Account Number: [id number] Date of Birth/Sex: [birth year] (51 y.o. M) Treating RN: Carlene Coria Primary Care Earma Nicolaou: Harrel Lemon Other Clinician: Referring Maximiano Lott: Harrel Lemon Treating Miki Blank/Extender: Skipper Cliche in Treatment: 8 Vital Signs Time Taken: 08:17 Temperature (F): 98.1 Height (in): 71 Pulse (bpm): 98 Weight (lbs): 141 Respiratory Rate (breaths/min): 18 Body Mass Index (BMI): 19.7 Blood Pressure (mmHg): 216/66 Reference Range: 80 - 120 mg / dl Electronic Signature(s) Signed: 03/09/2022 4:20:44 PM By: Massie Kluver Entered By: Massie Kluver on 03/09/2022 08:18:52

## 2022-03-10 DIAGNOSIS — N186 End stage renal disease: Secondary | ICD-10-CM | POA: Diagnosis not present

## 2022-03-10 DIAGNOSIS — Z992 Dependence on renal dialysis: Secondary | ICD-10-CM | POA: Diagnosis not present

## 2022-03-11 DIAGNOSIS — Z992 Dependence on renal dialysis: Secondary | ICD-10-CM | POA: Diagnosis not present

## 2022-03-11 DIAGNOSIS — N186 End stage renal disease: Secondary | ICD-10-CM | POA: Diagnosis not present

## 2022-03-12 ENCOUNTER — Other Ambulatory Visit (HOSPITAL_COMMUNITY): Payer: Self-pay

## 2022-03-12 DIAGNOSIS — N186 End stage renal disease: Secondary | ICD-10-CM | POA: Diagnosis not present

## 2022-03-12 DIAGNOSIS — Z992 Dependence on renal dialysis: Secondary | ICD-10-CM | POA: Diagnosis not present

## 2022-03-13 DIAGNOSIS — Z992 Dependence on renal dialysis: Secondary | ICD-10-CM | POA: Diagnosis not present

## 2022-03-13 DIAGNOSIS — N186 End stage renal disease: Secondary | ICD-10-CM | POA: Diagnosis not present

## 2022-03-14 DIAGNOSIS — N186 End stage renal disease: Secondary | ICD-10-CM | POA: Diagnosis not present

## 2022-03-14 DIAGNOSIS — Z992 Dependence on renal dialysis: Secondary | ICD-10-CM | POA: Diagnosis not present

## 2022-03-15 ENCOUNTER — Other Ambulatory Visit (HOSPITAL_COMMUNITY): Payer: Self-pay

## 2022-03-15 DIAGNOSIS — N186 End stage renal disease: Secondary | ICD-10-CM | POA: Diagnosis not present

## 2022-03-15 DIAGNOSIS — Z992 Dependence on renal dialysis: Secondary | ICD-10-CM | POA: Diagnosis not present

## 2022-03-16 ENCOUNTER — Ambulatory Visit: Payer: 59 | Admitting: Internal Medicine

## 2022-03-16 DIAGNOSIS — N186 End stage renal disease: Secondary | ICD-10-CM | POA: Diagnosis not present

## 2022-03-16 DIAGNOSIS — Z992 Dependence on renal dialysis: Secondary | ICD-10-CM | POA: Diagnosis not present

## 2022-03-17 DIAGNOSIS — Z992 Dependence on renal dialysis: Secondary | ICD-10-CM | POA: Diagnosis not present

## 2022-03-17 DIAGNOSIS — N186 End stage renal disease: Secondary | ICD-10-CM | POA: Diagnosis not present

## 2022-03-18 ENCOUNTER — Other Ambulatory Visit (HOSPITAL_COMMUNITY): Payer: Self-pay

## 2022-03-18 ENCOUNTER — Other Ambulatory Visit: Payer: Self-pay

## 2022-03-18 DIAGNOSIS — R053 Chronic cough: Secondary | ICD-10-CM | POA: Diagnosis not present

## 2022-03-18 DIAGNOSIS — Z992 Dependence on renal dialysis: Secondary | ICD-10-CM | POA: Diagnosis not present

## 2022-03-18 DIAGNOSIS — N186 End stage renal disease: Secondary | ICD-10-CM | POA: Diagnosis not present

## 2022-03-18 DIAGNOSIS — K219 Gastro-esophageal reflux disease without esophagitis: Secondary | ICD-10-CM | POA: Diagnosis not present

## 2022-03-18 MED ORDER — PREDNISONE 20 MG PO TABS
ORAL_TABLET | ORAL | 0 refills | Status: DC
  Filled 2022-03-18: qty 12, 8d supply, fill #0

## 2022-03-18 MED ORDER — SUCRALFATE 1 G PO TABS
ORAL_TABLET | ORAL | 11 refills | Status: DC
  Filled 2022-03-18: qty 120, 30d supply, fill #0

## 2022-03-18 MED ORDER — PANTOPRAZOLE SODIUM 40 MG PO TBEC
DELAYED_RELEASE_TABLET | ORAL | 11 refills | Status: DC
  Filled 2022-03-18: qty 90, 90d supply, fill #0
  Filled 2022-08-02: qty 90, 90d supply, fill #1

## 2022-03-19 DIAGNOSIS — N186 End stage renal disease: Secondary | ICD-10-CM | POA: Diagnosis not present

## 2022-03-19 DIAGNOSIS — Z992 Dependence on renal dialysis: Secondary | ICD-10-CM | POA: Diagnosis not present

## 2022-03-20 DIAGNOSIS — N186 End stage renal disease: Secondary | ICD-10-CM | POA: Diagnosis not present

## 2022-03-20 DIAGNOSIS — Z992 Dependence on renal dialysis: Secondary | ICD-10-CM | POA: Diagnosis not present

## 2022-03-21 DIAGNOSIS — N186 End stage renal disease: Secondary | ICD-10-CM | POA: Diagnosis not present

## 2022-03-21 DIAGNOSIS — Z992 Dependence on renal dialysis: Secondary | ICD-10-CM | POA: Diagnosis not present

## 2022-03-22 DIAGNOSIS — N186 End stage renal disease: Secondary | ICD-10-CM | POA: Diagnosis not present

## 2022-03-22 DIAGNOSIS — Z992 Dependence on renal dialysis: Secondary | ICD-10-CM | POA: Diagnosis not present

## 2022-03-23 ENCOUNTER — Ambulatory Visit: Payer: 59 | Admitting: Physician Assistant

## 2022-03-23 DIAGNOSIS — N186 End stage renal disease: Secondary | ICD-10-CM | POA: Diagnosis not present

## 2022-03-23 DIAGNOSIS — Z992 Dependence on renal dialysis: Secondary | ICD-10-CM | POA: Diagnosis not present

## 2022-03-24 DIAGNOSIS — N186 End stage renal disease: Secondary | ICD-10-CM | POA: Diagnosis not present

## 2022-03-24 DIAGNOSIS — Z992 Dependence on renal dialysis: Secondary | ICD-10-CM | POA: Diagnosis not present

## 2022-03-25 DIAGNOSIS — K659 Peritonitis, unspecified: Secondary | ICD-10-CM | POA: Diagnosis not present

## 2022-03-25 DIAGNOSIS — Z992 Dependence on renal dialysis: Secondary | ICD-10-CM | POA: Diagnosis not present

## 2022-03-25 DIAGNOSIS — B9689 Other specified bacterial agents as the cause of diseases classified elsewhere: Secondary | ICD-10-CM | POA: Diagnosis not present

## 2022-03-25 DIAGNOSIS — N186 End stage renal disease: Secondary | ICD-10-CM | POA: Diagnosis not present

## 2022-03-26 DIAGNOSIS — B9689 Other specified bacterial agents as the cause of diseases classified elsewhere: Secondary | ICD-10-CM | POA: Diagnosis not present

## 2022-03-26 DIAGNOSIS — K659 Peritonitis, unspecified: Secondary | ICD-10-CM | POA: Diagnosis not present

## 2022-03-26 DIAGNOSIS — N186 End stage renal disease: Secondary | ICD-10-CM | POA: Diagnosis not present

## 2022-03-26 DIAGNOSIS — Z992 Dependence on renal dialysis: Secondary | ICD-10-CM | POA: Diagnosis not present

## 2022-03-27 DIAGNOSIS — E874 Mixed disorder of acid-base balance: Secondary | ICD-10-CM | POA: Diagnosis not present

## 2022-03-27 DIAGNOSIS — Z992 Dependence on renal dialysis: Secondary | ICD-10-CM | POA: Diagnosis not present

## 2022-03-27 DIAGNOSIS — Z8673 Personal history of transient ischemic attack (TIA), and cerebral infarction without residual deficits: Secondary | ICD-10-CM | POA: Diagnosis not present

## 2022-03-27 DIAGNOSIS — R7989 Other specified abnormal findings of blood chemistry: Secondary | ICD-10-CM | POA: Diagnosis not present

## 2022-03-27 DIAGNOSIS — R918 Other nonspecific abnormal finding of lung field: Secondary | ICD-10-CM | POA: Diagnosis not present

## 2022-03-27 DIAGNOSIS — R002 Palpitations: Secondary | ICD-10-CM | POA: Diagnosis not present

## 2022-03-27 DIAGNOSIS — E875 Hyperkalemia: Secondary | ICD-10-CM | POA: Diagnosis not present

## 2022-03-27 DIAGNOSIS — G4733 Obstructive sleep apnea (adult) (pediatric): Secondary | ICD-10-CM | POA: Diagnosis not present

## 2022-03-27 DIAGNOSIS — N186 End stage renal disease: Secondary | ICD-10-CM | POA: Diagnosis not present

## 2022-03-27 DIAGNOSIS — B9689 Other specified bacterial agents as the cause of diseases classified elsewhere: Secondary | ICD-10-CM | POA: Diagnosis not present

## 2022-03-27 DIAGNOSIS — E1065 Type 1 diabetes mellitus with hyperglycemia: Secondary | ICD-10-CM | POA: Diagnosis not present

## 2022-03-27 DIAGNOSIS — I5020 Unspecified systolic (congestive) heart failure: Secondary | ICD-10-CM | POA: Diagnosis not present

## 2022-03-27 DIAGNOSIS — I120 Hypertensive chronic kidney disease with stage 5 chronic kidney disease or end stage renal disease: Secondary | ICD-10-CM | POA: Diagnosis not present

## 2022-03-27 DIAGNOSIS — T8571XA Infection and inflammatory reaction due to peritoneal dialysis catheter, initial encounter: Secondary | ICD-10-CM | POA: Diagnosis not present

## 2022-03-27 DIAGNOSIS — R112 Nausea with vomiting, unspecified: Secondary | ICD-10-CM | POA: Diagnosis not present

## 2022-03-27 DIAGNOSIS — I132 Hypertensive heart and chronic kidney disease with heart failure and with stage 5 chronic kidney disease, or end stage renal disease: Secondary | ICD-10-CM | POA: Diagnosis not present

## 2022-03-27 DIAGNOSIS — E8770 Fluid overload, unspecified: Secondary | ICD-10-CM | POA: Diagnosis not present

## 2022-03-27 DIAGNOSIS — E1022 Type 1 diabetes mellitus with diabetic chronic kidney disease: Secondary | ICD-10-CM | POA: Diagnosis not present

## 2022-03-27 DIAGNOSIS — R0602 Shortness of breath: Secondary | ICD-10-CM | POA: Diagnosis not present

## 2022-03-27 DIAGNOSIS — T8029XA Infection following other infusion, transfusion and therapeutic injection, initial encounter: Secondary | ICD-10-CM | POA: Diagnosis not present

## 2022-03-27 DIAGNOSIS — R109 Unspecified abdominal pain: Secondary | ICD-10-CM | POA: Diagnosis not present

## 2022-03-27 DIAGNOSIS — K659 Peritonitis, unspecified: Secondary | ICD-10-CM | POA: Diagnosis not present

## 2022-03-27 DIAGNOSIS — K658 Other peritonitis: Secondary | ICD-10-CM | POA: Diagnosis not present

## 2022-03-27 DIAGNOSIS — R911 Solitary pulmonary nodule: Secondary | ICD-10-CM | POA: Diagnosis not present

## 2022-03-27 DIAGNOSIS — E10649 Type 1 diabetes mellitus with hypoglycemia without coma: Secondary | ICD-10-CM | POA: Diagnosis not present

## 2022-03-27 DIAGNOSIS — Z9641 Presence of insulin pump (external) (internal): Secondary | ICD-10-CM | POA: Diagnosis not present

## 2022-03-27 DIAGNOSIS — R1084 Generalized abdominal pain: Secondary | ICD-10-CM | POA: Diagnosis not present

## 2022-03-28 DIAGNOSIS — R112 Nausea with vomiting, unspecified: Secondary | ICD-10-CM | POA: Diagnosis not present

## 2022-03-28 DIAGNOSIS — E874 Mixed disorder of acid-base balance: Secondary | ICD-10-CM | POA: Diagnosis not present

## 2022-03-28 DIAGNOSIS — B9689 Other specified bacterial agents as the cause of diseases classified elsewhere: Secondary | ICD-10-CM | POA: Diagnosis not present

## 2022-03-28 DIAGNOSIS — R1084 Generalized abdominal pain: Secondary | ICD-10-CM | POA: Diagnosis not present

## 2022-03-28 DIAGNOSIS — E8770 Fluid overload, unspecified: Secondary | ICD-10-CM | POA: Diagnosis not present

## 2022-03-28 DIAGNOSIS — I120 Hypertensive chronic kidney disease with stage 5 chronic kidney disease or end stage renal disease: Secondary | ICD-10-CM | POA: Diagnosis not present

## 2022-03-28 DIAGNOSIS — R7989 Other specified abnormal findings of blood chemistry: Secondary | ICD-10-CM | POA: Diagnosis not present

## 2022-03-28 DIAGNOSIS — Z992 Dependence on renal dialysis: Secondary | ICD-10-CM | POA: Diagnosis not present

## 2022-03-28 DIAGNOSIS — Z9641 Presence of insulin pump (external) (internal): Secondary | ICD-10-CM | POA: Diagnosis not present

## 2022-03-28 DIAGNOSIS — K659 Peritonitis, unspecified: Secondary | ICD-10-CM | POA: Diagnosis not present

## 2022-03-28 DIAGNOSIS — R109 Unspecified abdominal pain: Secondary | ICD-10-CM | POA: Diagnosis not present

## 2022-03-28 DIAGNOSIS — E1022 Type 1 diabetes mellitus with diabetic chronic kidney disease: Secondary | ICD-10-CM | POA: Diagnosis not present

## 2022-03-28 DIAGNOSIS — E1065 Type 1 diabetes mellitus with hyperglycemia: Secondary | ICD-10-CM | POA: Diagnosis not present

## 2022-03-28 DIAGNOSIS — N186 End stage renal disease: Secondary | ICD-10-CM | POA: Diagnosis not present

## 2022-03-29 DIAGNOSIS — R7989 Other specified abnormal findings of blood chemistry: Secondary | ICD-10-CM | POA: Diagnosis not present

## 2022-03-29 DIAGNOSIS — R109 Unspecified abdominal pain: Secondary | ICD-10-CM | POA: Diagnosis not present

## 2022-03-29 DIAGNOSIS — E1022 Type 1 diabetes mellitus with diabetic chronic kidney disease: Secondary | ICD-10-CM | POA: Diagnosis not present

## 2022-03-29 DIAGNOSIS — R911 Solitary pulmonary nodule: Secondary | ICD-10-CM | POA: Diagnosis not present

## 2022-03-29 DIAGNOSIS — B9689 Other specified bacterial agents as the cause of diseases classified elsewhere: Secondary | ICD-10-CM | POA: Diagnosis not present

## 2022-03-29 DIAGNOSIS — E10649 Type 1 diabetes mellitus with hypoglycemia without coma: Secondary | ICD-10-CM | POA: Diagnosis not present

## 2022-03-29 DIAGNOSIS — K659 Peritonitis, unspecified: Secondary | ICD-10-CM | POA: Diagnosis not present

## 2022-03-29 DIAGNOSIS — E1065 Type 1 diabetes mellitus with hyperglycemia: Secondary | ICD-10-CM | POA: Diagnosis not present

## 2022-03-29 DIAGNOSIS — G4733 Obstructive sleep apnea (adult) (pediatric): Secondary | ICD-10-CM | POA: Diagnosis not present

## 2022-03-29 DIAGNOSIS — N186 End stage renal disease: Secondary | ICD-10-CM | POA: Diagnosis not present

## 2022-03-29 DIAGNOSIS — E8770 Fluid overload, unspecified: Secondary | ICD-10-CM | POA: Diagnosis not present

## 2022-03-29 DIAGNOSIS — R112 Nausea with vomiting, unspecified: Secondary | ICD-10-CM | POA: Diagnosis not present

## 2022-03-29 DIAGNOSIS — Z992 Dependence on renal dialysis: Secondary | ICD-10-CM | POA: Diagnosis not present

## 2022-03-29 DIAGNOSIS — I120 Hypertensive chronic kidney disease with stage 5 chronic kidney disease or end stage renal disease: Secondary | ICD-10-CM | POA: Diagnosis not present

## 2022-03-30 ENCOUNTER — Ambulatory Visit: Payer: 59 | Admitting: Physician Assistant

## 2022-03-30 DIAGNOSIS — R112 Nausea with vomiting, unspecified: Secondary | ICD-10-CM | POA: Diagnosis not present

## 2022-03-30 DIAGNOSIS — N186 End stage renal disease: Secondary | ICD-10-CM | POA: Diagnosis not present

## 2022-03-30 DIAGNOSIS — Z992 Dependence on renal dialysis: Secondary | ICD-10-CM | POA: Diagnosis not present

## 2022-03-30 DIAGNOSIS — R7989 Other specified abnormal findings of blood chemistry: Secondary | ICD-10-CM | POA: Diagnosis not present

## 2022-03-30 DIAGNOSIS — E8770 Fluid overload, unspecified: Secondary | ICD-10-CM | POA: Diagnosis not present

## 2022-03-30 DIAGNOSIS — I120 Hypertensive chronic kidney disease with stage 5 chronic kidney disease or end stage renal disease: Secondary | ICD-10-CM | POA: Diagnosis not present

## 2022-03-30 DIAGNOSIS — R109 Unspecified abdominal pain: Secondary | ICD-10-CM | POA: Diagnosis not present

## 2022-03-30 DIAGNOSIS — E10649 Type 1 diabetes mellitus with hypoglycemia without coma: Secondary | ICD-10-CM | POA: Diagnosis not present

## 2022-03-30 DIAGNOSIS — R0602 Shortness of breath: Secondary | ICD-10-CM | POA: Diagnosis not present

## 2022-03-30 DIAGNOSIS — B9689 Other specified bacterial agents as the cause of diseases classified elsewhere: Secondary | ICD-10-CM | POA: Diagnosis not present

## 2022-03-30 DIAGNOSIS — E1065 Type 1 diabetes mellitus with hyperglycemia: Secondary | ICD-10-CM | POA: Diagnosis not present

## 2022-03-30 DIAGNOSIS — K659 Peritonitis, unspecified: Secondary | ICD-10-CM | POA: Diagnosis not present

## 2022-03-30 DIAGNOSIS — E1022 Type 1 diabetes mellitus with diabetic chronic kidney disease: Secondary | ICD-10-CM | POA: Diagnosis not present

## 2022-03-31 ENCOUNTER — Other Ambulatory Visit (HOSPITAL_COMMUNITY): Payer: Self-pay

## 2022-03-31 DIAGNOSIS — E1065 Type 1 diabetes mellitus with hyperglycemia: Secondary | ICD-10-CM | POA: Diagnosis not present

## 2022-03-31 DIAGNOSIS — E10649 Type 1 diabetes mellitus with hypoglycemia without coma: Secondary | ICD-10-CM | POA: Diagnosis not present

## 2022-03-31 DIAGNOSIS — R109 Unspecified abdominal pain: Secondary | ICD-10-CM | POA: Diagnosis not present

## 2022-03-31 DIAGNOSIS — E1022 Type 1 diabetes mellitus with diabetic chronic kidney disease: Secondary | ICD-10-CM | POA: Diagnosis not present

## 2022-03-31 DIAGNOSIS — E8770 Fluid overload, unspecified: Secondary | ICD-10-CM | POA: Diagnosis not present

## 2022-03-31 DIAGNOSIS — R002 Palpitations: Secondary | ICD-10-CM | POA: Diagnosis not present

## 2022-03-31 DIAGNOSIS — I132 Hypertensive heart and chronic kidney disease with heart failure and with stage 5 chronic kidney disease, or end stage renal disease: Secondary | ICD-10-CM | POA: Diagnosis not present

## 2022-03-31 DIAGNOSIS — R112 Nausea with vomiting, unspecified: Secondary | ICD-10-CM | POA: Diagnosis not present

## 2022-03-31 DIAGNOSIS — Z992 Dependence on renal dialysis: Secondary | ICD-10-CM | POA: Diagnosis not present

## 2022-03-31 DIAGNOSIS — B9689 Other specified bacterial agents as the cause of diseases classified elsewhere: Secondary | ICD-10-CM | POA: Diagnosis not present

## 2022-03-31 DIAGNOSIS — I5020 Unspecified systolic (congestive) heart failure: Secondary | ICD-10-CM | POA: Diagnosis not present

## 2022-03-31 DIAGNOSIS — K659 Peritonitis, unspecified: Secondary | ICD-10-CM | POA: Diagnosis not present

## 2022-03-31 DIAGNOSIS — N186 End stage renal disease: Secondary | ICD-10-CM | POA: Diagnosis not present

## 2022-04-01 DIAGNOSIS — N186 End stage renal disease: Secondary | ICD-10-CM | POA: Diagnosis not present

## 2022-04-01 DIAGNOSIS — T8571XA Infection and inflammatory reaction due to peritoneal dialysis catheter, initial encounter: Secondary | ICD-10-CM | POA: Diagnosis not present

## 2022-04-01 DIAGNOSIS — R911 Solitary pulmonary nodule: Secondary | ICD-10-CM | POA: Diagnosis not present

## 2022-04-01 DIAGNOSIS — Z992 Dependence on renal dialysis: Secondary | ICD-10-CM | POA: Diagnosis not present

## 2022-04-01 DIAGNOSIS — K659 Peritonitis, unspecified: Secondary | ICD-10-CM | POA: Diagnosis not present

## 2022-04-01 DIAGNOSIS — B9689 Other specified bacterial agents as the cause of diseases classified elsewhere: Secondary | ICD-10-CM | POA: Diagnosis not present

## 2022-04-01 DIAGNOSIS — E8770 Fluid overload, unspecified: Secondary | ICD-10-CM | POA: Diagnosis not present

## 2022-04-01 DIAGNOSIS — K658 Other peritonitis: Secondary | ICD-10-CM | POA: Diagnosis not present

## 2022-04-02 DIAGNOSIS — K659 Peritonitis, unspecified: Secondary | ICD-10-CM | POA: Diagnosis not present

## 2022-04-02 DIAGNOSIS — B9689 Other specified bacterial agents as the cause of diseases classified elsewhere: Secondary | ICD-10-CM | POA: Diagnosis not present

## 2022-04-02 DIAGNOSIS — N186 End stage renal disease: Secondary | ICD-10-CM | POA: Diagnosis not present

## 2022-04-02 DIAGNOSIS — Z992 Dependence on renal dialysis: Secondary | ICD-10-CM | POA: Diagnosis not present

## 2022-04-03 DIAGNOSIS — N186 End stage renal disease: Secondary | ICD-10-CM | POA: Diagnosis not present

## 2022-04-03 DIAGNOSIS — Z992 Dependence on renal dialysis: Secondary | ICD-10-CM | POA: Diagnosis not present

## 2022-04-04 DIAGNOSIS — N186 End stage renal disease: Secondary | ICD-10-CM | POA: Diagnosis not present

## 2022-04-04 DIAGNOSIS — Z992 Dependence on renal dialysis: Secondary | ICD-10-CM | POA: Diagnosis not present

## 2022-04-05 DIAGNOSIS — N186 End stage renal disease: Secondary | ICD-10-CM | POA: Diagnosis not present

## 2022-04-05 DIAGNOSIS — Z992 Dependence on renal dialysis: Secondary | ICD-10-CM | POA: Diagnosis not present

## 2022-04-06 DIAGNOSIS — N186 End stage renal disease: Secondary | ICD-10-CM | POA: Diagnosis not present

## 2022-04-06 DIAGNOSIS — Z992 Dependence on renal dialysis: Secondary | ICD-10-CM | POA: Diagnosis not present

## 2022-04-07 DIAGNOSIS — E109 Type 1 diabetes mellitus without complications: Secondary | ICD-10-CM | POA: Diagnosis not present

## 2022-04-07 DIAGNOSIS — Z992 Dependence on renal dialysis: Secondary | ICD-10-CM | POA: Diagnosis not present

## 2022-04-07 DIAGNOSIS — B9689 Other specified bacterial agents as the cause of diseases classified elsewhere: Secondary | ICD-10-CM | POA: Diagnosis not present

## 2022-04-07 DIAGNOSIS — K659 Peritonitis, unspecified: Secondary | ICD-10-CM | POA: Diagnosis not present

## 2022-04-07 DIAGNOSIS — N186 End stage renal disease: Secondary | ICD-10-CM | POA: Diagnosis not present

## 2022-04-08 DIAGNOSIS — Z992 Dependence on renal dialysis: Secondary | ICD-10-CM | POA: Diagnosis not present

## 2022-04-08 DIAGNOSIS — N186 End stage renal disease: Secondary | ICD-10-CM | POA: Diagnosis not present

## 2022-04-09 ENCOUNTER — Other Ambulatory Visit: Payer: Self-pay

## 2022-04-09 DIAGNOSIS — T8571XD Infection and inflammatory reaction due to peritoneal dialysis catheter, subsequent encounter: Secondary | ICD-10-CM | POA: Diagnosis not present

## 2022-04-09 DIAGNOSIS — F411 Generalized anxiety disorder: Secondary | ICD-10-CM | POA: Diagnosis not present

## 2022-04-09 DIAGNOSIS — Z09 Encounter for follow-up examination after completed treatment for conditions other than malignant neoplasm: Secondary | ICD-10-CM | POA: Diagnosis not present

## 2022-04-09 DIAGNOSIS — Z992 Dependence on renal dialysis: Secondary | ICD-10-CM | POA: Diagnosis not present

## 2022-04-09 DIAGNOSIS — N186 End stage renal disease: Secondary | ICD-10-CM | POA: Diagnosis not present

## 2022-04-09 MED ORDER — ALPRAZOLAM 0.25 MG PO TABS
ORAL_TABLET | ORAL | 1 refills | Status: DC
  Filled 2022-04-09: qty 60, 30d supply, fill #0
  Filled 2022-08-02: qty 60, 30d supply, fill #1

## 2022-04-10 DIAGNOSIS — N186 End stage renal disease: Secondary | ICD-10-CM | POA: Diagnosis not present

## 2022-04-10 DIAGNOSIS — Z992 Dependence on renal dialysis: Secondary | ICD-10-CM | POA: Diagnosis not present

## 2022-04-11 DIAGNOSIS — Z992 Dependence on renal dialysis: Secondary | ICD-10-CM | POA: Diagnosis not present

## 2022-04-11 DIAGNOSIS — N186 End stage renal disease: Secondary | ICD-10-CM | POA: Diagnosis not present

## 2022-04-12 DIAGNOSIS — N186 End stage renal disease: Secondary | ICD-10-CM | POA: Diagnosis not present

## 2022-04-12 DIAGNOSIS — Z992 Dependence on renal dialysis: Secondary | ICD-10-CM | POA: Diagnosis not present

## 2022-04-13 DIAGNOSIS — N186 End stage renal disease: Secondary | ICD-10-CM | POA: Diagnosis not present

## 2022-04-13 DIAGNOSIS — Z992 Dependence on renal dialysis: Secondary | ICD-10-CM | POA: Diagnosis not present

## 2022-04-14 DIAGNOSIS — Z992 Dependence on renal dialysis: Secondary | ICD-10-CM | POA: Diagnosis not present

## 2022-04-14 DIAGNOSIS — N186 End stage renal disease: Secondary | ICD-10-CM | POA: Diagnosis not present

## 2022-04-15 DIAGNOSIS — N186 End stage renal disease: Secondary | ICD-10-CM | POA: Diagnosis not present

## 2022-04-15 DIAGNOSIS — Z992 Dependence on renal dialysis: Secondary | ICD-10-CM | POA: Diagnosis not present

## 2022-04-16 ENCOUNTER — Other Ambulatory Visit (HOSPITAL_COMMUNITY): Payer: Self-pay

## 2022-04-16 DIAGNOSIS — N186 End stage renal disease: Secondary | ICD-10-CM | POA: Diagnosis not present

## 2022-04-16 DIAGNOSIS — Z992 Dependence on renal dialysis: Secondary | ICD-10-CM | POA: Diagnosis not present

## 2022-04-17 DIAGNOSIS — N186 End stage renal disease: Secondary | ICD-10-CM | POA: Diagnosis not present

## 2022-04-17 DIAGNOSIS — Z992 Dependence on renal dialysis: Secondary | ICD-10-CM | POA: Diagnosis not present

## 2022-04-18 DIAGNOSIS — E8770 Fluid overload, unspecified: Secondary | ICD-10-CM | POA: Diagnosis not present

## 2022-04-18 DIAGNOSIS — Z9641 Presence of insulin pump (external) (internal): Secondary | ICD-10-CM | POA: Diagnosis not present

## 2022-04-18 DIAGNOSIS — Z4902 Encounter for fitting and adjustment of peritoneal dialysis catheter: Secondary | ICD-10-CM | POA: Diagnosis not present

## 2022-04-18 DIAGNOSIS — Z992 Dependence on renal dialysis: Secondary | ICD-10-CM | POA: Diagnosis not present

## 2022-04-18 DIAGNOSIS — I120 Hypertensive chronic kidney disease with stage 5 chronic kidney disease or end stage renal disease: Secondary | ICD-10-CM | POA: Diagnosis not present

## 2022-04-18 DIAGNOSIS — D631 Anemia in chronic kidney disease: Secondary | ICD-10-CM | POA: Diagnosis not present

## 2022-04-18 DIAGNOSIS — R1084 Generalized abdominal pain: Secondary | ICD-10-CM | POA: Diagnosis not present

## 2022-04-18 DIAGNOSIS — E871 Hypo-osmolality and hyponatremia: Secondary | ICD-10-CM | POA: Diagnosis not present

## 2022-04-18 DIAGNOSIS — I132 Hypertensive heart and chronic kidney disease with heart failure and with stage 5 chronic kidney disease, or end stage renal disease: Secondary | ICD-10-CM | POA: Diagnosis not present

## 2022-04-18 DIAGNOSIS — Z136 Encounter for screening for cardiovascular disorders: Secondary | ICD-10-CM | POA: Diagnosis not present

## 2022-04-18 DIAGNOSIS — E1065 Type 1 diabetes mellitus with hyperglycemia: Secondary | ICD-10-CM | POA: Diagnosis not present

## 2022-04-18 DIAGNOSIS — N2581 Secondary hyperparathyroidism of renal origin: Secondary | ICD-10-CM | POA: Diagnosis not present

## 2022-04-18 DIAGNOSIS — K658 Other peritonitis: Secondary | ICD-10-CM | POA: Diagnosis not present

## 2022-04-18 DIAGNOSIS — K659 Peritonitis, unspecified: Secondary | ICD-10-CM | POA: Diagnosis not present

## 2022-04-18 DIAGNOSIS — I5020 Unspecified systolic (congestive) heart failure: Secondary | ICD-10-CM | POA: Diagnosis not present

## 2022-04-18 DIAGNOSIS — R7989 Other specified abnormal findings of blood chemistry: Secondary | ICD-10-CM | POA: Diagnosis not present

## 2022-04-18 DIAGNOSIS — K5900 Constipation, unspecified: Secondary | ICD-10-CM | POA: Diagnosis not present

## 2022-04-18 DIAGNOSIS — N179 Acute kidney failure, unspecified: Secondary | ICD-10-CM | POA: Diagnosis not present

## 2022-04-18 DIAGNOSIS — R112 Nausea with vomiting, unspecified: Secondary | ICD-10-CM | POA: Diagnosis not present

## 2022-04-18 DIAGNOSIS — N186 End stage renal disease: Secondary | ICD-10-CM | POA: Diagnosis not present

## 2022-04-18 DIAGNOSIS — A770 Spotted fever due to Rickettsia rickettsii: Secondary | ICD-10-CM | POA: Diagnosis not present

## 2022-04-18 DIAGNOSIS — E1122 Type 2 diabetes mellitus with diabetic chronic kidney disease: Secondary | ICD-10-CM | POA: Diagnosis not present

## 2022-04-18 DIAGNOSIS — R197 Diarrhea, unspecified: Secondary | ICD-10-CM | POA: Diagnosis not present

## 2022-04-18 DIAGNOSIS — R9431 Abnormal electrocardiogram [ECG] [EKG]: Secondary | ICD-10-CM | POA: Diagnosis not present

## 2022-04-18 DIAGNOSIS — E1022 Type 1 diabetes mellitus with diabetic chronic kidney disease: Secondary | ICD-10-CM | POA: Diagnosis not present

## 2022-04-18 DIAGNOSIS — R110 Nausea: Secondary | ICD-10-CM | POA: Diagnosis not present

## 2022-04-18 DIAGNOSIS — T8029XA Infection following other infusion, transfusion and therapeutic injection, initial encounter: Secondary | ICD-10-CM | POA: Diagnosis not present

## 2022-04-19 DIAGNOSIS — Z992 Dependence on renal dialysis: Secondary | ICD-10-CM | POA: Diagnosis not present

## 2022-04-19 DIAGNOSIS — N186 End stage renal disease: Secondary | ICD-10-CM | POA: Diagnosis not present

## 2022-04-20 DIAGNOSIS — Z992 Dependence on renal dialysis: Secondary | ICD-10-CM | POA: Diagnosis not present

## 2022-04-20 DIAGNOSIS — K659 Peritonitis, unspecified: Secondary | ICD-10-CM | POA: Diagnosis not present

## 2022-04-20 DIAGNOSIS — N186 End stage renal disease: Secondary | ICD-10-CM | POA: Diagnosis not present

## 2022-04-21 DIAGNOSIS — Z992 Dependence on renal dialysis: Secondary | ICD-10-CM | POA: Diagnosis not present

## 2022-04-21 DIAGNOSIS — N186 End stage renal disease: Secondary | ICD-10-CM | POA: Diagnosis not present

## 2022-04-21 NOTE — Telephone Encounter (Signed)
Opened in error

## 2022-04-22 DIAGNOSIS — Z992 Dependence on renal dialysis: Secondary | ICD-10-CM | POA: Diagnosis not present

## 2022-04-22 DIAGNOSIS — N186 End stage renal disease: Secondary | ICD-10-CM | POA: Diagnosis not present

## 2022-04-23 ENCOUNTER — Other Ambulatory Visit: Payer: Self-pay

## 2022-04-23 MED ORDER — INSULIN GLARGINE-YFGN 100 UNIT/ML ~~LOC~~ SOPN
PEN_INJECTOR | SUBCUTANEOUS | 0 refills | Status: DC
Start: 2022-04-23 — End: 2024-05-08
  Filled 2022-04-23: qty 15, 90d supply, fill #0

## 2022-04-23 MED ORDER — UNIFINE PENTIPS 31G X 5 MM MISC
3 refills | Status: DC
  Filled 2022-04-23: qty 100, 25d supply, fill #0
  Filled 2022-09-10: qty 300, 75d supply, fill #1

## 2022-04-24 DIAGNOSIS — Z992 Dependence on renal dialysis: Secondary | ICD-10-CM | POA: Diagnosis not present

## 2022-04-24 DIAGNOSIS — N186 End stage renal disease: Secondary | ICD-10-CM | POA: Diagnosis not present

## 2022-04-26 ENCOUNTER — Other Ambulatory Visit: Payer: Self-pay

## 2022-04-27 DIAGNOSIS — Z992 Dependence on renal dialysis: Secondary | ICD-10-CM | POA: Diagnosis not present

## 2022-04-27 DIAGNOSIS — N186 End stage renal disease: Secondary | ICD-10-CM | POA: Diagnosis not present

## 2022-04-29 DIAGNOSIS — Z992 Dependence on renal dialysis: Secondary | ICD-10-CM | POA: Diagnosis not present

## 2022-04-29 DIAGNOSIS — Z794 Long term (current) use of insulin: Secondary | ICD-10-CM | POA: Diagnosis not present

## 2022-04-29 DIAGNOSIS — E1122 Type 2 diabetes mellitus with diabetic chronic kidney disease: Secondary | ICD-10-CM | POA: Diagnosis not present

## 2022-04-29 DIAGNOSIS — N186 End stage renal disease: Secondary | ICD-10-CM | POA: Diagnosis not present

## 2022-05-01 DIAGNOSIS — N186 End stage renal disease: Secondary | ICD-10-CM | POA: Diagnosis not present

## 2022-05-01 DIAGNOSIS — Z992 Dependence on renal dialysis: Secondary | ICD-10-CM | POA: Diagnosis not present

## 2022-05-03 DIAGNOSIS — N186 End stage renal disease: Secondary | ICD-10-CM | POA: Diagnosis not present

## 2022-05-03 DIAGNOSIS — E113591 Type 2 diabetes mellitus with proliferative diabetic retinopathy without macular edema, right eye: Secondary | ICD-10-CM | POA: Diagnosis not present

## 2022-05-03 DIAGNOSIS — E113512 Type 2 diabetes mellitus with proliferative diabetic retinopathy with macular edema, left eye: Secondary | ICD-10-CM | POA: Diagnosis not present

## 2022-05-03 DIAGNOSIS — H43822 Vitreomacular adhesion, left eye: Secondary | ICD-10-CM | POA: Diagnosis not present

## 2022-05-03 DIAGNOSIS — Z992 Dependence on renal dialysis: Secondary | ICD-10-CM | POA: Diagnosis not present

## 2022-05-03 DIAGNOSIS — H4311 Vitreous hemorrhage, right eye: Secondary | ICD-10-CM | POA: Diagnosis not present

## 2022-05-04 DIAGNOSIS — E8779 Other fluid overload: Secondary | ICD-10-CM | POA: Diagnosis not present

## 2022-05-04 DIAGNOSIS — Z992 Dependence on renal dialysis: Secondary | ICD-10-CM | POA: Diagnosis not present

## 2022-05-04 DIAGNOSIS — N186 End stage renal disease: Secondary | ICD-10-CM | POA: Diagnosis not present

## 2022-05-05 DIAGNOSIS — N186 End stage renal disease: Secondary | ICD-10-CM | POA: Diagnosis not present

## 2022-05-05 DIAGNOSIS — Z992 Dependence on renal dialysis: Secondary | ICD-10-CM | POA: Diagnosis not present

## 2022-05-05 DIAGNOSIS — E8779 Other fluid overload: Secondary | ICD-10-CM | POA: Diagnosis not present

## 2022-05-06 DIAGNOSIS — Y999 Unspecified external cause status: Secondary | ICD-10-CM | POA: Diagnosis not present

## 2022-05-06 DIAGNOSIS — Z992 Dependence on renal dialysis: Secondary | ICD-10-CM | POA: Diagnosis not present

## 2022-05-06 DIAGNOSIS — E8779 Other fluid overload: Secondary | ICD-10-CM | POA: Diagnosis not present

## 2022-05-06 DIAGNOSIS — X58XXXA Exposure to other specified factors, initial encounter: Secondary | ICD-10-CM | POA: Diagnosis not present

## 2022-05-06 DIAGNOSIS — N186 End stage renal disease: Secondary | ICD-10-CM | POA: Diagnosis not present

## 2022-05-06 DIAGNOSIS — S39012A Strain of muscle, fascia and tendon of lower back, initial encounter: Secondary | ICD-10-CM | POA: Diagnosis not present

## 2022-05-08 DIAGNOSIS — E8779 Other fluid overload: Secondary | ICD-10-CM | POA: Diagnosis not present

## 2022-05-08 DIAGNOSIS — Z992 Dependence on renal dialysis: Secondary | ICD-10-CM | POA: Diagnosis not present

## 2022-05-08 DIAGNOSIS — N186 End stage renal disease: Secondary | ICD-10-CM | POA: Diagnosis not present

## 2022-05-11 DIAGNOSIS — N186 End stage renal disease: Secondary | ICD-10-CM | POA: Diagnosis not present

## 2022-05-11 DIAGNOSIS — Z992 Dependence on renal dialysis: Secondary | ICD-10-CM | POA: Diagnosis not present

## 2022-05-11 DIAGNOSIS — E8770 Fluid overload, unspecified: Secondary | ICD-10-CM | POA: Diagnosis not present

## 2022-05-12 DIAGNOSIS — N186 End stage renal disease: Secondary | ICD-10-CM | POA: Diagnosis not present

## 2022-05-12 DIAGNOSIS — Z992 Dependence on renal dialysis: Secondary | ICD-10-CM | POA: Diagnosis not present

## 2022-05-12 DIAGNOSIS — E8770 Fluid overload, unspecified: Secondary | ICD-10-CM | POA: Diagnosis not present

## 2022-05-13 DIAGNOSIS — N186 End stage renal disease: Secondary | ICD-10-CM | POA: Diagnosis not present

## 2022-05-13 DIAGNOSIS — E8770 Fluid overload, unspecified: Secondary | ICD-10-CM | POA: Diagnosis not present

## 2022-05-13 DIAGNOSIS — Z992 Dependence on renal dialysis: Secondary | ICD-10-CM | POA: Diagnosis not present

## 2022-05-15 DIAGNOSIS — N186 End stage renal disease: Secondary | ICD-10-CM | POA: Diagnosis not present

## 2022-05-15 DIAGNOSIS — E8770 Fluid overload, unspecified: Secondary | ICD-10-CM | POA: Diagnosis not present

## 2022-05-15 DIAGNOSIS — Z992 Dependence on renal dialysis: Secondary | ICD-10-CM | POA: Diagnosis not present

## 2022-05-17 ENCOUNTER — Encounter: Payer: 59 | Attending: Physician Assistant | Admitting: Physician Assistant

## 2022-05-17 DIAGNOSIS — N186 End stage renal disease: Secondary | ICD-10-CM | POA: Insufficient documentation

## 2022-05-17 DIAGNOSIS — Z7682 Awaiting organ transplant status: Secondary | ICD-10-CM | POA: Insufficient documentation

## 2022-05-17 DIAGNOSIS — L97511 Non-pressure chronic ulcer of other part of right foot limited to breakdown of skin: Secondary | ICD-10-CM | POA: Diagnosis not present

## 2022-05-17 DIAGNOSIS — X088XXA Exposure to other specified smoke, fire and flames, initial encounter: Secondary | ICD-10-CM | POA: Diagnosis not present

## 2022-05-17 DIAGNOSIS — E1143 Type 2 diabetes mellitus with diabetic autonomic (poly)neuropathy: Secondary | ICD-10-CM | POA: Diagnosis not present

## 2022-05-17 DIAGNOSIS — E1122 Type 2 diabetes mellitus with diabetic chronic kidney disease: Secondary | ICD-10-CM | POA: Insufficient documentation

## 2022-05-17 DIAGNOSIS — E11621 Type 2 diabetes mellitus with foot ulcer: Secondary | ICD-10-CM | POA: Diagnosis not present

## 2022-05-17 DIAGNOSIS — I120 Hypertensive chronic kidney disease with stage 5 chronic kidney disease or end stage renal disease: Secondary | ICD-10-CM | POA: Diagnosis not present

## 2022-05-17 DIAGNOSIS — T25321A Burn of third degree of right foot, initial encounter: Secondary | ICD-10-CM | POA: Diagnosis not present

## 2022-05-17 DIAGNOSIS — L97522 Non-pressure chronic ulcer of other part of left foot with fat layer exposed: Secondary | ICD-10-CM | POA: Insufficient documentation

## 2022-05-17 DIAGNOSIS — Y92096 Garden or yard of other non-institutional residence as the place of occurrence of the external cause: Secondary | ICD-10-CM | POA: Insufficient documentation

## 2022-05-17 DIAGNOSIS — Z992 Dependence on renal dialysis: Secondary | ICD-10-CM | POA: Insufficient documentation

## 2022-05-18 DIAGNOSIS — N186 End stage renal disease: Secondary | ICD-10-CM | POA: Diagnosis not present

## 2022-05-18 DIAGNOSIS — Z992 Dependence on renal dialysis: Secondary | ICD-10-CM | POA: Diagnosis not present

## 2022-05-19 DIAGNOSIS — H4311 Vitreous hemorrhage, right eye: Secondary | ICD-10-CM | POA: Diagnosis not present

## 2022-05-19 DIAGNOSIS — H35362 Drusen (degenerative) of macula, left eye: Secondary | ICD-10-CM | POA: Diagnosis not present

## 2022-05-19 DIAGNOSIS — H35371 Puckering of macula, right eye: Secondary | ICD-10-CM | POA: Diagnosis not present

## 2022-05-19 DIAGNOSIS — E113513 Type 2 diabetes mellitus with proliferative diabetic retinopathy with macular edema, bilateral: Secondary | ICD-10-CM | POA: Diagnosis not present

## 2022-05-20 DIAGNOSIS — Z992 Dependence on renal dialysis: Secondary | ICD-10-CM | POA: Diagnosis not present

## 2022-05-20 DIAGNOSIS — N186 End stage renal disease: Secondary | ICD-10-CM | POA: Diagnosis not present
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
## 2022-05-20 NOTE — Progress Notes (Signed)
ANDRW, MCGUIRT ([id number]) Visit Report for 05/17/2022 Arrival Information Details Patient Name: [name], [name]. Date of Service: 05/17/2022 10:30 AM Medical Record Number: [id number] Patient Account Number: [id number] Date of Birth/Sex: 03/30/1971 (51 y.o. M) Treating RN: Cornell Barman Primary Care Davetta Olliff: Harrel Lemon Other Clinician: Massie Kluver Referring Otniel Hoe: Harrel Lemon Treating Virlee Stroschein/Extender: Skipper Cliche in Treatment: 18 Visit Information History Since Last Visit All ordered tests and consults were completed: No Patient Arrived: Ambulatory Added or deleted any medications: No Arrival Time: 10:41 Any new allergies or adverse reactions: No Transfer Assistance: None Had a fall or experienced change in No Patient Requires Transmission-Based No activities of daily living that may affect Precautions: risk of falls: Patient Has Alerts: Yes Hospitalized since last visit: No Patient Alerts: Patient on Blood Thinner Pain Present Now: No DIABETIC aspirin '81mg'$  Non compressible ABI Righ Electronic Signature(s) Signed: 05/19/2022 1:47:09 PM By: Massie Kluver Entered By: Massie Kluver on 05/17/2022 10:41:37 Matthew Nan D. ([id number]) -------------------------------------------------------------------------------- Clinic Level of Care Assessment Details Patient Name: Matthew Nan D. Date of Service: 05/17/2022 10:30 AM Medical Record Number: [id number] Patient Account Number: [id number] Date of Birth/Sex: 1971-05-26 (51 y.o. M) Treating RN: Cornell Barman Primary Care Audy Dauphine: Harrel Lemon Other Clinician: Massie Kluver Referring Gar Glance: Harrel Lemon Treating Chanise Habeck/Extender: Skipper Cliche in Treatment: 18 Clinic Level of Care Assessment Items TOOL 4 Quantity Score '[]'$  - Use when only an EandM is performed on FOLLOW-UP visit 0 ASSESSMENTS - Nursing Assessment / Reassessment X - Reassessment of Co-morbidities (includes  updates in patient status) 1 10 X- 1 5 Reassessment of Adherence to Treatment Plan ASSESSMENTS - Wound and Skin Assessment / Reassessment X - Simple Wound Assessment / Reassessment - one wound 1 5 '[]'$  - 0 Complex Wound Assessment / Reassessment - multiple wounds '[]'$  - 0 Dermatologic / Skin Assessment (not related to wound area) ASSESSMENTS - Focused Assessment '[]'$  - Circumferential Edema Measurements - multi extremities 0 '[]'$  - 0 Nutritional Assessment / Counseling / Intervention '[]'$  - 0 Lower Extremity Assessment (monofilament, tuning fork, pulses) '[]'$  - 0 Peripheral Arterial Disease Assessment (using hand held doppler) ASSESSMENTS - Ostomy and/or Continence Assessment and Care '[]'$  - Incontinence Assessment and Management 0 '[]'$  - 0 Ostomy Care Assessment and Management (repouching, etc.) PROCESS - Coordination of Care X - Simple Patient / Family Education for ongoing care 1 15 '[]'$  - 0 Complex (extensive) Patient / Family Education for ongoing care '[]'$  - 0 Staff obtains Programmer, systems, Records, Test Results / Process Orders '[]'$  - 0 Staff telephones HHA, Nursing Homes / Clarify orders / etc '[]'$  - 0 Routine Transfer to another Facility (non-emergent condition) '[]'$  - 0 Routine Hospital Admission (non-emergent condition) '[]'$  - 0 New Admissions / Biomedical engineer / Ordering NPWT, Apligraf, etc. '[]'$  - 0 Emergency Hospital Admission (emergent condition) X- 1 10 Simple Discharge Coordination '[]'$  - 0 Complex (extensive) Discharge Coordination PROCESS - Special Needs '[]'$  - Pediatric / Minor Patient Management 0 '[]'$  - 0 Isolation Patient Management '[]'$  - 0 Hearing / Language / Visual special needs '[]'$  - 0 Assessment of Community assistance (transportation, D/C planning, etc.) '[]'$  - 0 Additional assistance / Altered mentation '[]'$  - 0 Support Surface(s) Assessment (bed, cushion, seat, etc.) INTERVENTIONS - Wound Cleansing / Measurement [name], Matthew D. ([id number]) X- 1 5 Simple Wound  Cleansing - one wound '[]'$  - 0 Complex Wound Cleansing - multiple wounds X- 1 5 Wound Imaging (photographs - any number of wounds) '[]'$  - 0 Wound Tracing (instead of photographs) '[]'$  -  0 Simple Wound Measurement - one wound '[]'$  - 0 Complex Wound Measurement - multiple wounds INTERVENTIONS - Wound Dressings '[]'$  - Small Wound Dressing one or multiple wounds 0 '[]'$  - 0 Medium Wound Dressing one or multiple wounds '[]'$  - 0 Large Wound Dressing one or multiple wounds '[]'$  - 0 Application of Medications - topical '[]'$  - 0 Application of Medications - injection INTERVENTIONS - Miscellaneous '[]'$  - External ear exam 0 '[]'$  - 0 Specimen Collection (cultures, biopsies, blood, body fluids, etc.) '[]'$  - 0 Specimen(s) / Culture(s) sent or taken to Lab for analysis '[]'$  - 0 Patient Transfer (multiple staff / Civil Service fast streamer / Similar devices) '[]'$  - 0 Simple Staple / Suture removal (25 or less) '[]'$  - 0 Complex Staple / Suture removal (26 or more) '[]'$  - 0 Hypo / Hyperglycemic Management (close monitor of Blood Glucose) '[]'$  - 0 Ankle / Brachial Index (ABI) - do not check if billed separately X- 1 5 Vital Signs Has the patient been seen at the hospital within the last three years: Yes Total Score: 60 Level Of Care: New/Established - Level 2 Electronic Signature(s) Signed: 05/19/2022 1:47:09 PM By: Massie Kluver Entered By: Massie Kluver on 05/17/2022 11:27:44 [name] ([id number]) -------------------------------------------------------------------------------- Encounter Discharge Information Details Patient Name: Matthew Nan D. Date of Service: 05/17/2022 10:30 AM Medical Record Number: [id number] Patient Account Number: [id number] Date of Birth/Sex: 17-Nov-1970 (51 y.o. M) Treating RN: Cornell Barman Primary Care Dnaiel Voller: Harrel Lemon Other Clinician: Massie Kluver Referring Ken Bonn: Harrel Lemon Treating Jaydin Jalomo/Extender: Skipper Cliche in Treatment: 18 Encounter Discharge  Information Items Discharge Condition: Stable Ambulatory Status: Ambulatory Discharge Destination: Home Transportation: Private Auto Accompanied By: self Schedule Follow-up Appointment: Yes Clinical Summary of Care: Electronic Signature(s) Signed: 05/19/2022 1:47:09 PM By: Massie Kluver Entered By: Massie Kluver on 05/17/2022 11:32:45 Matthew Nan D. ([id number]) -------------------------------------------------------------------------------- Lower Extremity Assessment Details Patient Name: Matthew Nan D. Date of Service: 05/17/2022 10:30 AM Medical Record Number: [id number] Patient Account Number: [id number] Date of Birth/Sex: [birth year] (50 y.o. M) Treating RN: Cornell Barman Primary Care Khalel Alms: Harrel Lemon Other Clinician: Massie Kluver Referring Kenyanna Grzesiak: Harrel Lemon Treating Zaryia Markel/Extender: Jeri Cos Weeks in Treatment: 18 Edema Assessment Assessed: [Left: Yes] [Right: Yes] Edema: [Left: Yes] [Right: Yes] Calf Left: Right: Point of Measurement: 33 cm From Medial Instep 42.2 cm 42 cm Ankle Left: Right: Point of Measurement: 10 cm From Medial Instep 29 cm 27.8 cm Vascular Assessment Pulses: Dorsalis Pedis Palpable: [Left:Yes] [Right:Yes] Electronic Signature(s) Signed: 05/17/2022 5:27:45 PM By: Gretta Cool, BSN, RN, CWS, Kim RN, BSN Signed: 05/19/2022 1:47:09 PM By: Massie Kluver Entered By: Massie Kluver on 05/17/2022 10:55:10 Matthew Nan D. ([id number]) -------------------------------------------------------------------------------- Multi Wound Chart Details Patient Name: Matthew Nan D. Date of Service: 05/17/2022 10:30 AM Medical Record Number: [id number] Patient Account Number: [id number] Date of Birth/Sex: Dec 12, 1970 (51 y.o. M) Treating RN: Cornell Barman Primary Care Jeana Kersting: Harrel Lemon Other Clinician: Massie Kluver Referring Eliam Snapp: Harrel Lemon Treating Alexandr Yaworski/Extender: Skipper Cliche in Treatment: 18 Vital  Signs Height(in): 49 Pulse(bpm): 80 Weight(lbs): 141 Blood Pressure(mmHg): 210/104 Body Mass Index(BMI): 16.7 Temperature(F): 97.8 Respiratory Rate(breaths/min): 16 Photos: [N/A:N/A] Wound Location: Left, Plantar Toe Great Right, Distal Toe Great N/A Wounding Event: Blister Shear/Friction N/A Primary Etiology: Diabetic Wound/Ulcer of the Lower Diabetic Wound/Ulcer of the Lower N/A Extremity Extremity Secondary Etiology: Trauma, Other N/A N/A Comorbid History: Sleep Apnea, Coronary Artery Sleep Apnea, Coronary Artery N/A Disease, Hypertension, Type I Disease, Hypertension, Type I Diabetes, End Stage Renal Disease, Diabetes, End Stage Renal Disease, Osteoarthritis, Neuropathy Osteoarthritis,  Neuropathy Date Acquired: 02/25/2022 03/07/2022 N/A Weeks of Treatment: 10 9 N/A Wound Status: Open Open N/A Wound Recurrence: No No N/A Measurements L x W x D (cm) 0.1x0.1x0.1 0.1x0.1x0.1 N/A Area (cm) : 0.008 0.008 N/A Volume (cm) : 0.001 0.001 N/A % Reduction in Area: 96.60% 99.50% N/A % Reduction in Volume: 99.20% 99.40% N/A Classification: Grade 2 Grade 1 N/A Exudate Amount: None Present None Present N/A Granulation Amount: None Present (0%) None Present (0%) N/A Necrotic Amount: None Present (0%) None Present (0%) N/A Exposed Structures: Fat Layer (Subcutaneous Tissue): Fascia: No N/A No Fat Layer (Subcutaneous Tissue): No Tendon: No Muscle: No Joint: No Bone: No Limited to Skin Breakdown Epithelialization: Large (67-100%) Large (67-100%) N/A Treatment Notes Electronic Signature(s) Signed: 05/19/2022 1:47:09 PM By: Massie Kluver Entered By: Massie Kluver on 05/17/2022 10:55:27 [name] ([id number]) -------------------------------------------------------------------------------- Russell Details Patient Name: Matthew Nan D. Date of Service: 05/17/2022 10:30 AM Medical Record Number: [id number] Patient Account Number: [id number] Date of  Birth/Sex: 1971-05-26 (51 y.o. M) Treating RN: Cornell Barman Primary Care Sarinity Dicicco: Harrel Lemon Other Clinician: Massie Kluver Referring Kayson Bullis: Harrel Lemon Treating Aseem Sessums/Extender: Skipper Cliche in Treatment: 18 Active Inactive Electronic Signature(s) Signed: 05/17/2022 5:27:45 PM By: Gretta Cool BSN, RN, CWS, Kim RN, BSN Signed: 05/19/2022 1:47:09 PM By: Massie Kluver Entered By: Massie Kluver on 05/17/2022 11:32:17 Matthew Nan D. ([id number]) -------------------------------------------------------------------------------- Pain Assessment Details Patient Name: [name], [name]. Date of Service: 05/17/2022 10:30 AM Medical Record Number: [id number] Patient Account Number: [id number] Date of Birth/Sex: 1971/08/16 (51 y.o. M) Treating RN: Cornell Barman Primary Care Masud Holub: Harrel Lemon Other Clinician: Massie Kluver Referring Harsimran Westman: Harrel Lemon Treating Shawnique Mariotti/Extender: Skipper Cliche in Treatment: 18 Active Problems Location of Pain Severity and Description of Pain Patient Has Paino No Site Locations Pain Management and Medication Current Pain Management: Electronic Signature(s) Signed: 05/17/2022 5:27:45 PM By: Gretta Cool, BSN, RN, CWS, Kim RN, BSN Signed: 05/19/2022 1:47:09 PM By: Massie Kluver Entered By: Massie Kluver on 05/17/2022 10:46:21 [name] ([id number]) -------------------------------------------------------------------------------- Patient/Caregiver Education Details Patient Name: [name]. Date of Service: 05/17/2022 10:30 AM Medical Record Number: [id number] Patient Account Number: [id number] Date of Birth/Gender: Jan 29, 1971 (51 y.o. M) Treating RN: Cornell Barman Primary Care Physician: Harrel Lemon Other Clinician: Massie Kluver Referring Physician: Harrel Lemon Treating Physician/Extender: Skipper Cliche in Treatment: 18 Education Assessment Education Provided To: Patient Education Topics  Provided Wound/Skin Impairment: Handouts: Other: wound healed Please call if any further issues arise Methods: Explain/Verbal Responses: State content correctly Electronic Signature(s) Signed: 05/19/2022 1:47:09 PM By: Massie Kluver Entered By: Massie Kluver on 05/17/2022 11:28:33 Matthew Nan D. ([id number]) -------------------------------------------------------------------------------- Wound Assessment Details Patient Name: Matthew Nan D. Date of Service: 05/17/2022 10:30 AM Medical Record Number: [id number] Patient Account Number: [id number] Date of Birth/Sex: 1971/08/13 (51 y.o. M) Treating RN: Cornell Barman Primary Care Ritvik Mczeal: Harrel Lemon Other Clinician: Massie Kluver Referring Reuben Knoblock: Harrel Lemon Treating Leianne Callins/Extender: Jeri Cos Weeks in Treatment: 18 Wound Status Wound Number: 4 Primary Diabetic Wound/Ulcer of the Lower Extremity Etiology: Wound Location: Left, Plantar Toe Great Secondary Trauma, Other Wounding Event: Blister Etiology: Date Acquired: 02/25/2022 Wound Healed - Epithelialized Weeks Of Treatment: 10 Status: Clustered Wound: No Comorbid Sleep Apnea, Coronary Artery Disease, Hypertension, History: Type I Diabetes, End Stage Renal Disease, Osteoarthritis, Neuropathy Photos Wound Measurements Length: (cm) 0 % Red Width: (cm) 0 % Red Depth: (cm) 0 Epith Area: (cm) 0 Volume: (cm) 0 uction in Area: 100% uction in Volume: 100% elialization: Large (67-100%) Wound Description Classification: Grade  2 Foul Exudate Amount: None Present Sloug Odor After Cleansing: No h/Fibrino Yes Wound Bed Granulation Amount: None Present (0%) Exposed Structure Necrotic Amount: None Present (0%) Fat Layer (Subcutaneous Tissue) Exposed: No Treatment Notes Wound #4 (Toe Great) Wound Laterality: Plantar, Left Cleanser Peri-Wound Care Topical Primary Dressing Secondary Dressing Secured With [name], [name] ([id number]) Compression  Wrap Compression Stockings Add-Ons Electronic Signature(s) Signed: 05/17/2022 5:27:45 PM By: Gretta Cool, BSN, RN, CWS, Kim RN, BSN Signed: 05/19/2022 1:47:09 PM By: Massie Kluver Entered By: Massie Kluver on 05/17/2022 11:22:10 [name], Matthew D. ([id number]) -------------------------------------------------------------------------------- Wound Assessment Details Patient Name: Matthew Nan D. Date of Service: 05/17/2022 10:30 AM Medical Record Number: [id number] Patient Account Number: [id number] Date of Birth/Sex: July 22, 1971 (51 y.o. M) Treating RN: Cornell Barman Primary Care Arieal Cuoco: Harrel Lemon Other Clinician: Massie Kluver Referring Peggy Monk: Harrel Lemon Treating Denijah Karrer/Extender: Jeri Cos Weeks in Treatment: 18 Wound Status Wound Number: 5 Primary Diabetic Wound/Ulcer of the Lower Extremity Etiology: Wound Location: Right, Distal Toe Great Wound Healed - Epithelialized Wounding Event: Shear/Friction Status: Date Acquired: 03/07/2022 Comorbid Sleep Apnea, Coronary Artery Disease, Hypertension, Weeks Of Treatment: 9 History: Type I Diabetes, End Stage Renal Disease, Osteoarthritis, Clustered Wound: No Neuropathy Photos Wound Measurements Length: (cm) 0 % Red Width: (cm) 0 % Red Depth: (cm) 0 Epith Area: (cm) 0 Volume: (cm) 0 uction in Area: 100% uction in Volume: 100% elialization: Large (67-100%) Wound Description Classification: Grade 1 Exudate Amount: None Present Wound Bed Granulation Amount: None Present (0%) Exposed Structure Necrotic Amount: None Present (0%) Fascia Exposed: No Fat Layer (Subcutaneous Tissue) Exposed: No Tendon Exposed: No Muscle Exposed: No Joint Exposed: No Bone Exposed: No Limited to Skin Breakdown Treatment Notes Wound #5 (Toe Great) Wound Laterality: Right, Distal Cleanser Peri-Wound Care Topical Primary Dressing [name], [name] ([id number]) Secondary Dressing Secured With Compression Wrap Compression  Stockings Add-Ons Electronic Signature(s) Signed: 05/17/2022 5:27:45 PM By: Gretta Cool, BSN, RN, CWS, Kim RN, BSN Signed: 05/19/2022 1:47:09 PM By: Massie Kluver Entered By: Massie Kluver on 05/17/2022 11:22:10 [name], Matthew D. ([id number]) -------------------------------------------------------------------------------- Vitals Details Patient Name: Matthew Nan D. Date of Service: 05/17/2022 10:30 AM Medical Record Number: [id number] Patient Account Number: [id number] Date of Birth/Sex: 03/18/1971 (51 y.o. M) Treating RN: Cornell Barman Primary Care Adrielle Polakowski: Harrel Lemon Other Clinician: Massie Kluver Referring Terrion Poblano: Harrel Lemon Treating Yavuz Kirby/Extender: Skipper Cliche in Treatment: 18 Vital Signs Time Taken: 10:44 Temperature (F): 97.8 Height (in): 77 Pulse (bpm): 77 Weight (lbs): 141 Respiratory Rate (breaths/min): 16 Body Mass Index (BMI): 16.7 Blood Pressure (mmHg): 210/104 Reference Range: 80 - 120 mg / dl Notes Per patient, BP has been running really high and pcp is aware Electronic Signature(s) Signed: 05/19/2022 1:47:09 PM By: Massie Kluver Entered By: Massie Kluver on 05/17/2022 10:46:16

## 2022-05-22 DIAGNOSIS — N186 End stage renal disease: Secondary | ICD-10-CM | POA: Diagnosis not present

## 2022-05-22 DIAGNOSIS — Z992 Dependence on renal dialysis: Secondary | ICD-10-CM | POA: Diagnosis not present

## 2022-05-24 DIAGNOSIS — N186 End stage renal disease: Secondary | ICD-10-CM | POA: Diagnosis not present

## 2022-05-24 DIAGNOSIS — Z992 Dependence on renal dialysis: Secondary | ICD-10-CM | POA: Diagnosis not present

## 2022-05-25 ENCOUNTER — Other Ambulatory Visit: Payer: Self-pay

## 2022-05-26 DIAGNOSIS — H4311 Vitreous hemorrhage, right eye: Secondary | ICD-10-CM | POA: Diagnosis not present

## 2022-05-26 DIAGNOSIS — H35371 Puckering of macula, right eye: Secondary | ICD-10-CM | POA: Diagnosis not present

## 2022-05-26 DIAGNOSIS — H35362 Drusen (degenerative) of macula, left eye: Secondary | ICD-10-CM | POA: Diagnosis not present

## 2022-05-26 DIAGNOSIS — H43822 Vitreomacular adhesion, left eye: Secondary | ICD-10-CM | POA: Diagnosis not present

## 2022-05-26 DIAGNOSIS — E113513 Type 2 diabetes mellitus with proliferative diabetic retinopathy with macular edema, bilateral: Secondary | ICD-10-CM | POA: Diagnosis not present

## 2022-05-26 DIAGNOSIS — H31092 Other chorioretinal scars, left eye: Secondary | ICD-10-CM | POA: Diagnosis not present

## 2022-05-27 DIAGNOSIS — Z992 Dependence on renal dialysis: Secondary | ICD-10-CM | POA: Diagnosis not present

## 2022-05-27 DIAGNOSIS — N186 End stage renal disease: Secondary | ICD-10-CM | POA: Diagnosis not present

## 2022-05-29 DIAGNOSIS — N186 End stage renal disease: Secondary | ICD-10-CM | POA: Diagnosis not present

## 2022-05-29 DIAGNOSIS — Z992 Dependence on renal dialysis: Secondary | ICD-10-CM | POA: Diagnosis not present

## 2022-05-31 ENCOUNTER — Other Ambulatory Visit: Payer: Self-pay

## 2022-05-31 DIAGNOSIS — N186 End stage renal disease: Secondary | ICD-10-CM | POA: Diagnosis not present

## 2022-05-31 DIAGNOSIS — H2513 Age-related nuclear cataract, bilateral: Secondary | ICD-10-CM | POA: Diagnosis not present

## 2022-05-31 DIAGNOSIS — H4311 Vitreous hemorrhage, right eye: Secondary | ICD-10-CM | POA: Diagnosis not present

## 2022-05-31 DIAGNOSIS — E103593 Type 1 diabetes mellitus with proliferative diabetic retinopathy without macular edema, bilateral: Secondary | ICD-10-CM | POA: Diagnosis not present

## 2022-05-31 DIAGNOSIS — Z992 Dependence on renal dialysis: Secondary | ICD-10-CM | POA: Diagnosis not present

## 2022-05-31 MED ORDER — PREDNISOLONE ACETATE 1 % OP SUSP
OPHTHALMIC | 1 refills | Status: DC
  Filled 2022-05-31: qty 10, 15d supply, fill #0
  Filled 2022-05-31: qty 10, 5d supply, fill #0

## 2022-05-31 MED ORDER — KETOROLAC TROMETHAMINE 0.5 % OP SOLN
OPHTHALMIC | 1 refills | Status: DC
  Filled 2022-05-31: qty 5, 1d supply, fill #0

## 2022-05-31 MED ORDER — KETOROLAC TROMETHAMINE 0.4 % OP SOLN
OPHTHALMIC | 1 refills | Status: DC
  Filled 2022-05-31: qty 5, 10d supply, fill #0

## 2022-05-31 MED ORDER — OFLOXACIN 0.3 % OP SOLN
OPHTHALMIC | 1 refills | Status: DC
  Filled 2022-05-31: qty 5, 10d supply, fill #0

## 2022-06-01 DIAGNOSIS — E1121 Type 2 diabetes mellitus with diabetic nephropathy: Secondary | ICD-10-CM | POA: Diagnosis not present

## 2022-06-01 DIAGNOSIS — I10 Essential (primary) hypertension: Secondary | ICD-10-CM | POA: Diagnosis not present

## 2022-06-01 DIAGNOSIS — Z7682 Awaiting organ transplant status: Secondary | ICD-10-CM | POA: Diagnosis not present

## 2022-06-01 DIAGNOSIS — E103293 Type 1 diabetes mellitus with mild nonproliferative diabetic retinopathy without macular edema, bilateral: Secondary | ICD-10-CM | POA: Diagnosis not present

## 2022-06-01 DIAGNOSIS — Z114 Encounter for screening for human immunodeficiency virus [HIV]: Secondary | ICD-10-CM | POA: Diagnosis not present

## 2022-06-01 DIAGNOSIS — Z1159 Encounter for screening for other viral diseases: Secondary | ICD-10-CM | POA: Diagnosis not present

## 2022-06-01 DIAGNOSIS — E1021 Type 1 diabetes mellitus with diabetic nephropathy: Secondary | ICD-10-CM | POA: Diagnosis not present

## 2022-06-01 DIAGNOSIS — Z01818 Encounter for other preprocedural examination: Secondary | ICD-10-CM | POA: Diagnosis not present

## 2022-06-01 DIAGNOSIS — E039 Hypothyroidism, unspecified: Secondary | ICD-10-CM | POA: Diagnosis not present

## 2022-06-02 ENCOUNTER — Other Ambulatory Visit: Payer: Self-pay

## 2022-06-02 DIAGNOSIS — Z992 Dependence on renal dialysis: Secondary | ICD-10-CM | POA: Diagnosis not present

## 2022-06-02 DIAGNOSIS — N186 End stage renal disease: Secondary | ICD-10-CM | POA: Diagnosis not present

## 2022-06-02 MED ORDER — MINOXIDIL 2.5 MG PO TABS
ORAL_TABLET | ORAL | 11 refills | Status: DC
  Filled 2022-06-02: qty 30, 30d supply, fill #0
  Filled 2022-08-02: qty 30, 30d supply, fill #1
  Filled 2022-11-01: qty 30, 30d supply, fill #2

## 2022-06-03 DIAGNOSIS — H4311 Vitreous hemorrhage, right eye: Secondary | ICD-10-CM | POA: Diagnosis not present

## 2022-06-03 DIAGNOSIS — E103511 Type 1 diabetes mellitus with proliferative diabetic retinopathy with macular edema, right eye: Secondary | ICD-10-CM | POA: Diagnosis not present

## 2022-06-03 DIAGNOSIS — H2511 Age-related nuclear cataract, right eye: Secondary | ICD-10-CM | POA: Diagnosis not present

## 2022-06-03 DIAGNOSIS — H35371 Puckering of macula, right eye: Secondary | ICD-10-CM | POA: Diagnosis not present

## 2022-06-03 DIAGNOSIS — Z992 Dependence on renal dialysis: Secondary | ICD-10-CM | POA: Diagnosis not present

## 2022-06-03 DIAGNOSIS — N186 End stage renal disease: Secondary | ICD-10-CM | POA: Diagnosis not present

## 2022-06-03 DIAGNOSIS — E113511 Type 2 diabetes mellitus with proliferative diabetic retinopathy with macular edema, right eye: Secondary | ICD-10-CM | POA: Diagnosis not present

## 2022-06-05 DIAGNOSIS — Z992 Dependence on renal dialysis: Secondary | ICD-10-CM | POA: Diagnosis not present

## 2022-06-05 DIAGNOSIS — N186 End stage renal disease: Secondary | ICD-10-CM | POA: Diagnosis not present

## 2022-06-07 DIAGNOSIS — Z992 Dependence on renal dialysis: Secondary | ICD-10-CM | POA: Diagnosis not present

## 2022-06-07 DIAGNOSIS — N186 End stage renal disease: Secondary | ICD-10-CM | POA: Diagnosis not present

## 2022-06-09 DIAGNOSIS — Z992 Dependence on renal dialysis: Secondary | ICD-10-CM | POA: Diagnosis not present

## 2022-06-09 DIAGNOSIS — N186 End stage renal disease: Secondary | ICD-10-CM | POA: Diagnosis not present

## 2022-06-11 DIAGNOSIS — Z992 Dependence on renal dialysis: Secondary | ICD-10-CM | POA: Diagnosis not present

## 2022-06-11 DIAGNOSIS — N186 End stage renal disease: Secondary | ICD-10-CM | POA: Diagnosis not present

## 2022-06-13 NOTE — H&P (View-Only) (Signed)
MRN : [id number]  [name] is a 51 y.o. (1970-12-15) male who presents with chief complaint of check access.  History of Present Illness:   The patient is seen for evaluation for dialysis access. The patient has chronic renal insufficiency stage V secondary to hypertension. The patient's most recent creatinine clearance is less than 15.  Patient's blood pressures been relatively well controlled. There are mild uremic symptoms which appear to be relatively well tolerated at this time.  The patient notes the kidney problem has been present for a long time and has been progressively getting worse.  The patient is followed by nephrology.    The patient is right-handed.  The patient has been considering the various methods of dialysis and wishes to proceed with hemodialysis and therefore creation of AV access.  No recent shortening of the patient's walking distance or new symptoms consistent with claudication.  No history of rest pain symptoms. No new ulcers or wounds of the lower extremities have occurred.  The patient denies amaurosis fugax or recent TIA symptoms. There are no recent neurological changes noted. There is no history of DVT, PE or superficial thrombophlebitis. No recent episodes of angina or shortness of breath documented.    Vein mapping shows a left cephalic vein at the antecubital fossa that measures 3.9 mm  No outpatient medications have been marked as taking for the 06/14/22 encounter (Appointment) with Delana Meyer, Dolores Lory, MD.    Past Medical History:  Diagnosis Date   ESRD (end stage renal disease) ([place])    Gastroparesis    Herniated cervical disc    HTN (hypertension)    Hypercholesteremia    Morbid obesity ([place])    OSA (obstructive sleep apnea)    Peritoneal dialysis status (HCC)    PONV (postoperative nausea and vomiting)    after peritoneal dialysis catheter was placed   S/P cardiac cath    a. 10-15 yrs ago at [facility] due  to tachycardia, reportedly normal. b. Normal ETT 03/2012.   Sinus tachycardia    a. 24-hr Holter 03/2012 - SR, occ PVCs, no VT, avg HR 96bpm.   TIA (transient ischemic attack)    Type 1 diabetes mellitus ([place])    a. With insulin pump.    Past Surgical History:  Procedure Laterality Date   BIOPSY  10/03/2019   Procedure: BIOPSY;  Surgeon: Mauri Pole, MD;  Location: [facility];  Service: Endoscopy;;   cervical neck fusion     COLONOSCOPY WITH PROPOFOL N/A 01/02/2021   Procedure: COLONOSCOPY WITH PROPOFOL;  Surgeon: [name], MD;  Location: [facility];  Service: Endoscopy;  Laterality: N/A;  priority 4 DIABETIC needs potassium draw   ESOPHAGOGASTRODUODENOSCOPY (EGD) WITH PROPOFOL N/A 10/03/2019   Procedure: ESOPHAGOGASTRODUODENOSCOPY (EGD) WITH PROPOFOL;  Surgeon: Mauri Pole, MD;  [facility];  Service: Endoscopy;  Laterality: N/A;   HERNIA REPAIR     bilateral   LEFT HEART CATH AND CORONARY ANGIOGRAPHY Left 10/16/2021   Procedure: LEFT HEART CATH AND CORONARY ANGIOGRAPHY;  Surgeon: Wellington Hampshire, MD;  Location: [place] [facility];  Service: Cardiovascular;  Laterality: Left;   LEFT HEART CATHETERIZATION WITH CORONARY ANGIOGRAM N/A 12/18/2013   Procedure: LEFT HEART CATHETERIZATION WITH CORONARY ANGIOGRAM;  Surgeon: [name] M Martinique, MD;  Location: [facility] CATH [facility];  Service: Cardiovascular;  Laterality: N/A;   POLYPECTOMY  01/02/2021  Procedure: POLYPECTOMY;  Surgeon: [name], MD;  Location: [facility];  Service: Endoscopy;;    Social History Social History   Tobacco Use   Smoking status: Never   Smokeless tobacco: Never  Vaping Use   Vaping Use: Never used  Substance Use Topics   Alcohol use: No   Drug use: No    Family History Family History  Problem Relation Age of Onset   Hypertension Other    Coronary artery disease Other        Mother's side - both her side's grandparents died of heart disease (MIs)   Diabetes Other     Stroke Other        Paternal grandfather (82)   Prostate cancer Neg Hx    Bladder Cancer Neg Hx    Kidney cancer Neg Hx     Allergies  Allergen Reactions   Sulfa Antibiotics Anaphylaxis and Swelling    Pt is not allergic to iodine, has had iodine in the past w/o premeds and w/ no problems   Oxycontin [Oxycodone Hcl] Nausea And Vomiting   Penicillins Other (See Comments)    Possible reaction many years ago per patient (Pt reports he has had pcn without issues since time of "reaction") Other reaction(s): Unknown   Prednisone Other (See Comments)    Patient is diabetic, runs sugar up     Shellfish-Derived Products Swelling    Eyes swell with shellfish Pt is not allergic to iodine, has had iodine in the past w/o premeds and w/ no problem   Pregabalin Other (See Comments)    Muscle twitching and jerks     REVIEW OF SYSTEMS (Negative unless checked)  Constitutional: '[]'$ Weight loss  '[]'$ Fever  '[]'$ Chills Cardiac: '[]'$ Chest pain   '[]'$ Chest pressure   '[]'$ Palpitations   '[]'$ Shortness of breath when laying flat   '[]'$ Shortness of breath with exertion. Vascular:  '[]'$ Pain in legs with walking   '[]'$ Pain in legs at rest  '[]'$ History of DVT   '[]'$ Phlebitis   '[]'$ Swelling in legs   '[]'$ Varicose veins   '[]'$ Non-healing ulcers Pulmonary:   '[]'$ Uses home oxygen   '[]'$ Productive cough   '[]'$ Hemoptysis   '[]'$ Wheeze  '[]'$ COPD   '[]'$ Asthma Neurologic:  '[]'$ Dizziness   '[]'$ Seizures   '[]'$ History of stroke   '[]'$ History of TIA  '[]'$ Aphasia   '[]'$ Vissual changes   '[]'$ Weakness or numbness in arm   '[]'$ Weakness or numbness in leg Musculoskeletal:   '[]'$ Joint swelling   '[]'$ Joint pain   '[]'$ Low back pain Hematologic:  '[]'$ Easy bruising  '[]'$ Easy bleeding   '[]'$ Hypercoagulable state   '[]'$ Anemic Gastrointestinal:  '[]'$ Diarrhea   '[]'$ Vomiting  '[]'$ Gastroesophageal reflux/heartburn   '[]'$ Difficulty swallowing. Genitourinary:  '[x]'$ Chronic kidney disease   '[]'$ Difficult urination  '[]'$ Frequent urination   '[]'$ Blood in urine Skin:  '[]'$ Rashes   '[]'$ Ulcers  Psychological:  '[]'$ History of anxiety    '[]'$  History of major depression.  Physical Examination  There were no vitals filed for this visit. There is no height or weight on file to calculate BMI. Gen: WD/WN, NAD Head: [place]/AT, No temporalis wasting.  Ear/Nose/Throat: Hearing grossly intact, nares w/o erythema or drainage Eyes: PER, EOMI, sclera nonicteric.  Neck: Supple, no gross masses or lesions.  No JVD.  Pulmonary:  Good air movement, no audible wheezing, no use of accessory muscles.  Cardiac: RRR, precordium non-hyperdynamic. Vascular:   palpable cephalic vein in the left antecubital fossa Vessel Right Left  Radial Palpable Palpable  Brachial Palpable Palpable  Gastrointestinal: soft, non-distended. No guarding/no peritoneal signs.  Musculoskeletal: M/S 5/5 throughout.  No deformity.  Neurologic: CN 2-12 intact. Pain and light touch intact in extremities.  Symmetrical.  Speech is fluent. Motor exam as listed above. Psychiatric: Judgment intact, Mood & affect appropriate for pt's clinical situation. Dermatologic: No rashes or ulcers noted.  No changes consistent with cellulitis.   CBC Lab Results  Component Value Date   WBC 6.0 10/13/2021   HGB 10.6 (L) 10/13/2021   HCT 32.3 (L) 10/13/2021   MCV 88 10/13/2021   PLT 272 10/13/2021    BMET    Component Value Date/Time   NA 136 10/13/2021 1218   K 4.6 10/13/2021 1218   CL 98 10/13/2021 1218   CO2 23 10/13/2021 1218   GLUCOSE 211 (H) 10/13/2021 1218   GLUCOSE 217 (H) 02/10/2021 1540   BUN 99 (HH) 10/13/2021 1218   CREATININE 14.09 (H) 10/13/2021 1218   CALCIUM 7.9 (L) 10/13/2021 1218   GFRNONAA 7 (L) 01/24/2021 0229   GFRAA 18 (L) 01/28/2020 0719   CrCl cannot be calculated (Patient's most recent lab result is older than the maximum 21 days allowed.).  COAG Lab Results  Component Value Date   INR 0.90 08/28/2018   INR 1.07 09/07/2015   INR 1.01 12/17/2013    Radiology No results found.   Assessment/Plan 1. CKD (chronic kidney disease), stage IV  (HCC) Recommend:  At this time the patient does not have appropriate extremity access for dialysis  Patient should have a left brachial cephalic created.  The risks, benefits and alternative therapies were reviewed in detail with the patient.  All questions were answered.  The patient agrees to proceed with surgery.   The patient will follow up with me in the office after the surgery.   2. Type 1 diabetes mellitus with diabetic neuropathy ([place]) Continue hypoglycemic medications as already ordered, these medications have been reviewed and there are no changes at this time.  Hgb A1C to be monitored as already arranged by primary service   3. Primary hypertension Continue antihypertensive medications as already ordered, these medications have been reviewed and there are no changes at this time.   4. Hypercholesteremia Continue statin as ordered and reviewed, no changes at this time     Hortencia Pilar, MD  06/13/2022 8:48 PM

## 2022-06-13 NOTE — Progress Notes (Signed)
MRN : [id number]  [name] is a 51 y.o. (1971/08/29) male who presents with chief complaint of check access.  History of Present Illness:   The patient is seen for evaluation for dialysis access. The patient has chronic renal insufficiency stage V secondary to hypertension. The patient's most recent creatinine clearance is less than 15.  Patient's blood pressures been relatively well controlled. There are mild uremic symptoms which appear to be relatively well tolerated at this time.  The patient notes the kidney problem has been present for a long time and has been progressively getting worse.  The patient is followed by nephrology.    The patient is right-handed.  The patient has been considering the various methods of dialysis and wishes to proceed with hemodialysis and therefore creation of AV access.  No recent shortening of the patient's walking distance or new symptoms consistent with claudication.  No history of rest pain symptoms. No new ulcers or wounds of the lower extremities have occurred.  The patient denies amaurosis fugax or recent TIA symptoms. There are no recent neurological changes noted. There is no history of DVT, PE or superficial thrombophlebitis. No recent episodes of angina or shortness of breath documented.    Vein mapping shows a left cephalic vein at the antecubital fossa that measures 3.9 mm  No outpatient medications have been marked as taking for the 06/14/22 encounter (Appointment) with Delana Meyer, Dolores Lory, MD.    Past Medical History:  Diagnosis Date   ESRD (end stage renal disease) ([place])    Gastroparesis    Herniated cervical disc    HTN (hypertension)    Hypercholesteremia    Morbid obesity ([place])    OSA (obstructive sleep apnea)    Peritoneal dialysis status (HCC)    PONV (postoperative nausea and vomiting)    after peritoneal dialysis catheter was placed   S/P cardiac cath    a. 10-15 yrs ago at [facility] due  to tachycardia, reportedly normal. b. Normal ETT 03/2012.   Sinus tachycardia    a. 24-hr Holter 03/2012 - SR, occ PVCs, no VT, avg HR 96bpm.   TIA (transient ischemic attack)    Type 1 diabetes mellitus ([place])    a. With insulin pump.    Past Surgical History:  Procedure Laterality Date   BIOPSY  10/03/2019   Procedure: BIOPSY;  Surgeon: Mauri Pole, MD;  [facility];  Service: Endoscopy;;   cervical neck fusion     COLONOSCOPY WITH PROPOFOL N/A 01/02/2021   Procedure: COLONOSCOPY WITH PROPOFOL;  Surgeon: [name], MD;  Location: [place] [facility];  Service: Endoscopy;  Laterality: N/A;  priority 4 DIABETIC needs potassium draw   ESOPHAGOGASTRODUODENOSCOPY (EGD) WITH PROPOFOL N/A 10/03/2019   Procedure: ESOPHAGOGASTRODUODENOSCOPY (EGD) WITH PROPOFOL;  Surgeon: Mauri Pole, MD;  [facility];  Service: Endoscopy;  Laterality: N/A;   HERNIA REPAIR     bilateral   LEFT HEART CATH AND CORONARY ANGIOGRAPHY Left 10/16/2021   Procedure: LEFT HEART CATH AND CORONARY ANGIOGRAPHY;  Surgeon: Wellington Hampshire, MD;  Location: [place] [facility];  Service: Cardiovascular;  Laterality: Left;   LEFT HEART CATHETERIZATION WITH CORONARY ANGIOGRAM N/A 12/18/2013   Procedure: LEFT HEART CATHETERIZATION WITH CORONARY ANGIOGRAM;  Surgeon: [name] M Martinique, MD;  Location: [facility] CATH LAB;  Service: Cardiovascular;  Laterality: N/A;   POLYPECTOMY  01/02/2021  Procedure: POLYPECTOMY;  Surgeon: [name], MD;  Location: [facility];  Service: Endoscopy;;    Social History Social History   Tobacco Use   Smoking status: Never   Smokeless tobacco: Never  Vaping Use   Vaping Use: Never used  Substance Use Topics   Alcohol use: No   Drug use: No    Family History Family History  Problem Relation Age of Onset   Hypertension Other    Coronary artery disease Other        Mother's side - both her side's grandparents died of heart disease (MIs)   Diabetes Other     Stroke Other        Paternal grandfather (82)   Prostate cancer Neg Hx    Bladder Cancer Neg Hx    Kidney cancer Neg Hx     Allergies  Allergen Reactions   Sulfa Antibiotics Anaphylaxis and Swelling    Pt is not allergic to iodine, has had iodine in the past w/o premeds and w/ no problems   Oxycontin [Oxycodone Hcl] Nausea And Vomiting   Penicillins Other (See Comments)    Possible reaction many years ago per patient (Pt reports he has had pcn without issues since time of "reaction") Other reaction(s): Unknown   Prednisone Other (See Comments)    Patient is diabetic, runs sugar up     Shellfish-Derived Products Swelling    Eyes swell with shellfish Pt is not allergic to iodine, has had iodine in the past w/o premeds and w/ no problem   Pregabalin Other (See Comments)    Muscle twitching and jerks     REVIEW OF SYSTEMS (Negative unless checked)  Constitutional: '[]'$ Weight loss  '[]'$ Fever  '[]'$ Chills Cardiac: '[]'$ Chest pain   '[]'$ Chest pressure   '[]'$ Palpitations   '[]'$ Shortness of breath when laying flat   '[]'$ Shortness of breath with exertion. Vascular:  '[]'$ Pain in legs with walking   '[]'$ Pain in legs at rest  '[]'$ History of DVT   '[]'$ Phlebitis   '[]'$ Swelling in legs   '[]'$ Varicose veins   '[]'$ Non-healing ulcers Pulmonary:   '[]'$ Uses home oxygen   '[]'$ Productive cough   '[]'$ Hemoptysis   '[]'$ Wheeze  '[]'$ COPD   '[]'$ Asthma Neurologic:  '[]'$ Dizziness   '[]'$ Seizures   '[]'$ History of stroke   '[]'$ History of TIA  '[]'$ Aphasia   '[]'$ Vissual changes   '[]'$ Weakness or numbness in arm   '[]'$ Weakness or numbness in leg Musculoskeletal:   '[]'$ Joint swelling   '[]'$ Joint pain   '[]'$ Low back pain Hematologic:  '[]'$ Easy bruising  '[]'$ Easy bleeding   '[]'$ Hypercoagulable state   '[]'$ Anemic Gastrointestinal:  '[]'$ Diarrhea   '[]'$ Vomiting  '[]'$ Gastroesophageal reflux/heartburn   '[]'$ Difficulty swallowing. Genitourinary:  '[x]'$ Chronic kidney disease   '[]'$ Difficult urination  '[]'$ Frequent urination   '[]'$ Blood in urine Skin:  '[]'$ Rashes   '[]'$ Ulcers  Psychological:  '[]'$ History of anxiety    '[]'$  History of major depression.  Physical Examination  There were no vitals filed for this visit. There is no height or weight on file to calculate BMI. Gen: WD/WN, NAD Head: [place]/AT, No temporalis wasting.  Ear/Nose/Throat: Hearing grossly intact, nares w/o erythema or drainage Eyes: PER, EOMI, sclera nonicteric.  Neck: Supple, no gross masses or lesions.  No JVD.  Pulmonary:  Good air movement, no audible wheezing, no use of accessory muscles.  Cardiac: RRR, precordium non-hyperdynamic. Vascular:   palpable cephalic vein in the left antecubital fossa Vessel Right Left  Radial Palpable Palpable  Brachial Palpable Palpable  Gastrointestinal: soft, non-distended. No guarding/no peritoneal signs.  Musculoskeletal: M/S 5/5 throughout.  No deformity.  Neurologic: CN 2-12 intact. Pain and light touch intact in extremities.  Symmetrical.  Speech is fluent. Motor exam as listed above. Psychiatric: Judgment intact, Mood & affect appropriate for pt's clinical situation. Dermatologic: No rashes or ulcers noted.  No changes consistent with cellulitis.   CBC Lab Results  Component Value Date   WBC 6.0 10/13/2021   HGB 10.6 (L) 10/13/2021   HCT 32.3 (L) 10/13/2021   MCV 88 10/13/2021   PLT 272 10/13/2021    BMET    Component Value Date/Time   NA 136 10/13/2021 1218   K 4.6 10/13/2021 1218   CL 98 10/13/2021 1218   CO2 23 10/13/2021 1218   GLUCOSE 211 (H) 10/13/2021 1218   GLUCOSE 217 (H) 02/10/2021 1540   BUN 99 (HH) 10/13/2021 1218   CREATININE 14.09 (H) 10/13/2021 1218   CALCIUM 7.9 (L) 10/13/2021 1218   GFRNONAA 7 (L) 01/24/2021 0229   GFRAA 18 (L) 01/28/2020 0719   CrCl cannot be calculated (Patient's most recent lab result is older than the maximum 21 days allowed.).  COAG Lab Results  Component Value Date   INR 0.90 08/28/2018   INR 1.07 09/07/2015   INR 1.01 12/17/2013    Radiology No results found.   Assessment/Plan 1. CKD (chronic kidney disease), stage IV  (HCC) Recommend:  At this time the patient does not have appropriate extremity access for dialysis  Patient should have a left brachial cephalic created.  The risks, benefits and alternative therapies were reviewed in detail with the patient.  All questions were answered.  The patient agrees to proceed with surgery.   The patient will follow up with me in the office after the surgery.   2. 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  Hgb A1C to be monitored as already arranged by primary service   3. Primary hypertension Continue antihypertensive medications as already ordered, these medications have been reviewed and there are no changes at this time.   4. Hypercholesteremia Continue statin as ordered and reviewed, no changes at this time     Hortencia Pilar, MD  06/13/2022 8:48 PM

## 2022-06-14 ENCOUNTER — Ambulatory Visit (INDEPENDENT_AMBULATORY_CARE_PROVIDER_SITE_OTHER): Payer: 59

## 2022-06-14 ENCOUNTER — Ambulatory Visit (INDEPENDENT_AMBULATORY_CARE_PROVIDER_SITE_OTHER): Payer: 59 | Admitting: Vascular Surgery

## 2022-06-14 ENCOUNTER — Other Ambulatory Visit (INDEPENDENT_AMBULATORY_CARE_PROVIDER_SITE_OTHER): Payer: Self-pay | Admitting: Vascular Surgery

## 2022-06-14 ENCOUNTER — Encounter (INDEPENDENT_AMBULATORY_CARE_PROVIDER_SITE_OTHER): Payer: Self-pay | Admitting: Vascular Surgery

## 2022-06-14 VITALS — BP 181/78 | HR 87 | Resp 18 | Ht 69.5 in | Wt 227.4 lb

## 2022-06-14 DIAGNOSIS — N184 Chronic kidney disease, stage 4 (severe): Secondary | ICD-10-CM | POA: Diagnosis not present

## 2022-06-14 DIAGNOSIS — I10 Essential (primary) hypertension: Secondary | ICD-10-CM

## 2022-06-14 DIAGNOSIS — E1040 Type 1 diabetes mellitus with diabetic neuropathy, unspecified: Secondary | ICD-10-CM

## 2022-06-14 DIAGNOSIS — E7800 Pure hypercholesterolemia, unspecified: Secondary | ICD-10-CM | POA: Diagnosis not present

## 2022-06-14 DIAGNOSIS — Z992 Dependence on renal dialysis: Secondary | ICD-10-CM | POA: Diagnosis not present

## 2022-06-14 DIAGNOSIS — N186 End stage renal disease: Secondary | ICD-10-CM | POA: Diagnosis not present

## 2022-06-15 DIAGNOSIS — E103511 Type 1 diabetes mellitus with proliferative diabetic retinopathy with macular edema, right eye: Secondary | ICD-10-CM | POA: Diagnosis not present

## 2022-06-16 ENCOUNTER — Telehealth (INDEPENDENT_AMBULATORY_CARE_PROVIDER_SITE_OTHER): Payer: Self-pay

## 2022-06-16 DIAGNOSIS — Z992 Dependence on renal dialysis: Secondary | ICD-10-CM | POA: Diagnosis not present

## 2022-06-16 DIAGNOSIS — N186 End stage renal disease: Secondary | ICD-10-CM | POA: Diagnosis not present

## 2022-06-16 NOTE — Telephone Encounter (Signed)
I attempted to contact the patient to schedule a left brachial cephalic fistula with Dr. Delana Meyer. A message was left for a return call.

## 2022-06-17 NOTE — Telephone Encounter (Signed)
Patient returned my calls and is scheduled with Dr. Delana Meyer for a left brachial cephalic fistula on 93/73/42 at the [facility]. Pre-op phone call is on 06/23/22 between 1-5 pm. Pre-surgical instructions were discussed and will be mailed.

## 2022-06-18 DIAGNOSIS — N186 End stage renal disease: Secondary | ICD-10-CM | POA: Diagnosis not present

## 2022-06-18 DIAGNOSIS — Z992 Dependence on renal dialysis: Secondary | ICD-10-CM | POA: Diagnosis not present

## 2022-06-19 DIAGNOSIS — E039 Hypothyroidism, unspecified: Secondary | ICD-10-CM | POA: Diagnosis not present

## 2022-06-19 DIAGNOSIS — E871 Hypo-osmolality and hyponatremia: Secondary | ICD-10-CM | POA: Diagnosis not present

## 2022-06-19 DIAGNOSIS — Z9889 Other specified postprocedural states: Secondary | ICD-10-CM | POA: Diagnosis not present

## 2022-06-19 DIAGNOSIS — R58 Hemorrhage, not elsewhere classified: Secondary | ICD-10-CM | POA: Diagnosis not present

## 2022-06-19 DIAGNOSIS — E1065 Type 1 diabetes mellitus with hyperglycemia: Secondary | ICD-10-CM | POA: Diagnosis not present

## 2022-06-19 DIAGNOSIS — E1022 Type 1 diabetes mellitus with diabetic chronic kidney disease: Secondary | ICD-10-CM | POA: Diagnosis not present

## 2022-06-19 DIAGNOSIS — N186 End stage renal disease: Secondary | ICD-10-CM | POA: Diagnosis not present

## 2022-06-19 DIAGNOSIS — T8242XA Displacement of vascular dialysis catheter, initial encounter: Secondary | ICD-10-CM | POA: Diagnosis not present

## 2022-06-19 DIAGNOSIS — G4733 Obstructive sleep apnea (adult) (pediatric): Secondary | ICD-10-CM | POA: Diagnosis not present

## 2022-06-19 DIAGNOSIS — E1122 Type 2 diabetes mellitus with diabetic chronic kidney disease: Secondary | ICD-10-CM | POA: Diagnosis not present

## 2022-06-19 DIAGNOSIS — E872 Acidosis, unspecified: Secondary | ICD-10-CM | POA: Diagnosis not present

## 2022-06-19 DIAGNOSIS — E876 Hypokalemia: Secondary | ICD-10-CM | POA: Diagnosis not present

## 2022-06-19 DIAGNOSIS — Z992 Dependence on renal dialysis: Secondary | ICD-10-CM | POA: Diagnosis not present

## 2022-06-19 DIAGNOSIS — Z4901 Encounter for fitting and adjustment of extracorporeal dialysis catheter: Secondary | ICD-10-CM | POA: Diagnosis not present

## 2022-06-19 DIAGNOSIS — J811 Chronic pulmonary edema: Secondary | ICD-10-CM | POA: Diagnosis not present

## 2022-06-19 DIAGNOSIS — D649 Anemia, unspecified: Secondary | ICD-10-CM | POA: Diagnosis not present

## 2022-06-19 DIAGNOSIS — E669 Obesity, unspecified: Secondary | ICD-10-CM | POA: Diagnosis not present

## 2022-06-19 DIAGNOSIS — E875 Hyperkalemia: Secondary | ICD-10-CM | POA: Diagnosis not present

## 2022-06-19 DIAGNOSIS — I10 Essential (primary) hypertension: Secondary | ICD-10-CM | POA: Diagnosis not present

## 2022-06-19 DIAGNOSIS — E878 Other disorders of electrolyte and fluid balance, not elsewhere classified: Secondary | ICD-10-CM | POA: Diagnosis not present

## 2022-06-19 DIAGNOSIS — I120 Hypertensive chronic kidney disease with stage 5 chronic kidney disease or end stage renal disease: Secondary | ICD-10-CM | POA: Diagnosis not present

## 2022-06-20 ENCOUNTER — Encounter (INDEPENDENT_AMBULATORY_CARE_PROVIDER_SITE_OTHER): Payer: Self-pay | Admitting: Vascular Surgery

## 2022-06-23 ENCOUNTER — Encounter: Payer: Self-pay | Admitting: Urgent Care

## 2022-06-23 ENCOUNTER — Encounter
Admission: RE | Admit: 2022-06-23 | Discharge: 2022-06-23 | Disposition: A | Discharge status: Home / Self Care | Payer: 59 | Source: Ambulatory Visit | Attending: Vascular Surgery | Admitting: Vascular Surgery

## 2022-06-23 ENCOUNTER — Other Ambulatory Visit (INDEPENDENT_AMBULATORY_CARE_PROVIDER_SITE_OTHER): Payer: Self-pay | Admitting: Nurse Practitioner

## 2022-06-23 DIAGNOSIS — N184 Chronic kidney disease, stage 4 (severe): Secondary | ICD-10-CM

## 2022-06-23 DIAGNOSIS — Z992 Dependence on renal dialysis: Secondary | ICD-10-CM

## 2022-06-23 DIAGNOSIS — N186 End stage renal disease: Secondary | ICD-10-CM | POA: Diagnosis not present

## 2022-06-23 DIAGNOSIS — E1069 Type 1 diabetes mellitus with other specified complication: Secondary | ICD-10-CM

## 2022-06-23 DIAGNOSIS — E1065 Type 1 diabetes mellitus with hyperglycemia: Secondary | ICD-10-CM

## 2022-06-23 DIAGNOSIS — E875 Hyperkalemia: Secondary | ICD-10-CM

## 2022-06-23 DIAGNOSIS — Z01818 Encounter for other preprocedural examination: Secondary | ICD-10-CM

## 2022-06-23 HISTORY — DX: Cellulitis, unspecified: L03.90

## 2022-06-23 HISTORY — DX: Sepsis, unspecified organism: A41.9

## 2022-06-23 HISTORY — DX: Hypothyroidism, unspecified: E03.9

## 2022-06-23 HISTORY — DX: Presence of insulin pump (external) (internal): Z96.41

## 2022-06-23 HISTORY — DX: Other dysphagia: R13.19

## 2022-06-23 HISTORY — DX: Type 2 diabetes mellitus with ketoacidosis without coma: E11.10

## 2022-06-23 NOTE — Patient Instructions (Signed)
Your procedure is scheduled on:06-30-22 Wednesday Report to the Registration Desk on the 1st floor of the [facility].Then proceed to the 2nd floor Surgery Desk To find out your arrival time, please call [phone] between 1PM - 3PM on:06-29-22 Tuesday If your arrival time is 6:00 am, do not arrive prior to that time as the [facility] entrance doors do not open until 6:00 am.  REMEMBER: Instructions that are not followed completely may result in serious medical risk, up to and including death; or upon the discretion of your surgeon and anesthesiologist your surgery may need to be rescheduled.  Do not eat food after midnight the night before surgery.  No gum chewing, lozengers or hard candies.  You may however, drink Water up to 2 hours before you are scheduled to arrive for your surgery. Do not drink anything within 2 hours of your scheduled arrival time.  Type 1 and Type 2 diabetics should only drink water.  TAKE THESE MEDICATIONS THE MORNING OF SURGERY WITH A SIP OF WATER: -amLODipine (NORVASC) -carvedilol (COREG)  -cloNIDine (CATAPRES) -pantoprazole (PROTONIX)-take one the night before and one on the morning of surgery - helps to prevent nausea after surgery.)  Take half of your [facility] Insulin (2.5 Units) the night before surgery and NO Insulin the morning of surgery  One week prior to surgery: Stop Anti-inflammatories (NSAIDS) such as Advil, Aleve, Ibuprofen, Motrin, Naproxen, Naprosyn and Aspirin based products such as Excedrin, Goodys Powder, BC Powder.You may however, continue to take Tylenol if needed for pain up until the day of surgery.  Stop ANY OVER THE COUNTER supplements/vitamins NOW (06-23-22) until after surgery.  No Alcohol for 24 hours before or after surgery.  No Smoking including e-cigarettes for 24 hours prior to surgery.  No chewable tobacco products for at least 6 hours prior to surgery.  No nicotine patches on the day of surgery.  Do not use any  "recreational" drugs for at least a week prior to your surgery.  Please be advised that the combination of cocaine and anesthesia may have negative outcomes, up to and including death. If you test positive for cocaine, your surgery will be cancelled.  On the morning of surgery brush your teeth with toothpaste and water, you may rinse your mouth with mouthwash if you wish. Do not swallow any toothpaste or mouthwash.  Use CHG wipes as directed on instruction sheet.  Do not wear jewelry, make-up, hairpins, clips or nail polish.  Do not wear lotions, powders, or perfumes.   Do not shave body from the neck down 48 hours prior to surgery just in case you cut yourself which could leave a site for infection.  Also, freshly shaved skin may become irritated if using the CHG soap.  Contact lenses, hearing aids and dentures may not be worn into surgery.  Do not bring valuables to the hospital. [facility] is not responsible for any missing/lost belongings or valuables.   Total Shoulder Arthroplasty:  use Benzolyl Peroxide 5% Gel as directed on instruction sheet.  Fleets enema or bowel prep as directed.  Bring your C-PAP to the hospital with you   Notify your doctor if there is any change in your medical condition (cold, fever, infection).  Wear comfortable clothing (specific to your surgery type) to the hospital.  After surgery, you can help prevent lung complications by doing breathing exercises.  Take deep breaths and cough every 1-2 hours. Your doctor may order a device called an Incentive Spirometer to help you take  deep breaths. When coughing or sneezing, hold a pillow firmly against your incision with both hands. This is called "splinting." Doing this helps protect your incision. It also decreases belly discomfort.  If you are being admitted to the hospital overnight, leave your suitcase in the car. After surgery it may be brought to your room.  If you are being discharged the day of  surgery, you will not be allowed to drive home. You will need a responsible adult (18 years or older) to drive you home and stay with you that night.   If you are taking public transportation, you will need to have a responsible adult (18 years or older) with you. Please confirm with your physician that it is acceptable to use public transportation.   Please call the [place] Dept[facility] at [phone] if you have any questions about these instructions.  Surgery Visitation Policy:  Patients undergoing a surgery or procedure may have two family members or support persons with them as long as the person is not COVID-19 positive or experiencing its symptoms.

## 2022-06-23 NOTE — Progress Notes (Signed)
Perioperative Services Pre-Admission/Anesthesia Testing   Date: 06/23/22 Name: [name] MRN:   [id number]  Re: Consideration of preoperative prophylactic antibiotic change   Request sent to: Schnier, Dolores Lory, MD and Eulogio Ditch, NP-C (routed and/or faxed via [facility])  Planned Surgical Procedure(s):    Case: [id number] Date/Time: 06/30/22 1206   Procedure: ARTERIOVENOUS (AV) FISTULA CREATION (BRACHIALCEPHALIC) (Left)   Anesthesia type: General   Pre-op diagnosis: ESRD   Location: [place] [facility] [place] [facility]   Surgeons: Katha Cabal, MD   Clinical Notes:  Patient has a documented allergy/intolerance to PCN  The actual reaction to PCN is unknown. Reports having had PCN without issues.   EMR review indicated that patient received PCN and/or cephalosporin in the past as follows: CEFAZOLIN received on 05/29/2020 with no documented ADRs.   Screened as appropriate for cephalosporin use during medication reconciliation No immediate angioedema, dysphagia, SOB, anaphylaxis symptoms. No severe rash involving mucous membranes or skin necrosis. No hospital admissions related to side effects of PCN/cephalosporin use.  No documented reaction to PCN or cephalosporin in the last 10 years.  Request:  As an evidence based approach to reducing the rate of incidence for post-operative SSI and the development of MDROs, could an agent that allows for narrower antimicrobial coverage for preoperative prophylaxis in this patient's upcoming surgical course be considered?   Currently ordered preoperative prophylactic ABX: vancomycin.   Specifically requesting change to cephalosporin (CEFAZOLIN).  Drug of choice for many procedures; it is the most widely studied antimicrobial agent with proven efficacy for antimicrobial prophylaxis.   Desirable duration of action, spectrum of activity against organisms commonly encountered in surgery, and it has an excellent safety  profile and low cost.   Active against streptococci, methicillin-susceptible staphylococci, and many gram-negative organisms.  Please communicate decision with me and I will change the orders in Epic as per your direction.   Things to consider: Many patients report that they were "allergic" to PCN earlier in life, however this does not translate into a true lifelong allergy. Patients can lose sensitivity to specific IgE antibodies over time if PCN is avoided (Kleris & Lugar, 2019).  Up to 10% of the adult population and 15% of hospitalized patients report an allergy to PCN, however clinical studies suggest that 90% of those reporting an allergy can tolerate PCN antibiotics (Kleris & Lugar, 2019).  Cross-sensitivity between PCN and cephalosporins has been documented as being as high as 10%, however this estimation included data believed to have been collected in a setting where there was contamination. Newer data suggests that the prevalence of cross-sensitivity between PCN and cephalosporins is actually estimated to be closer to 1% (Hermanides et al., 2018).   Patients labeled as PCN allergic, whether they are truly allergic or not, have been found to have inferior outcomes in terms of rates of serious infection, and these patients tend to have longer hospital stays ([place], 2019).  Treatment related secondary infections, such as Clostridioides difficile, have been linked to the improper use of broad spectrum antibiotics in patients improperly labeled as PCN allergic (Kleris & Lugar, 2019).  Anaphylaxis from cephalosporins is rare and the evidence suggests that there is no increased risk of an anaphylactic type reaction when cephalosporins are used in a PCN allergic patient (Pichichero, 2006).  Citations: Hermanides J, Lemkes BA, Prins Pearla Dubonnet MW, Terreehorst I. Presumed ?-Lactam Allergy and Cross-reactivity in the Operating Theater: A Practical Approach. Anesthesiology. 2018  Aug;129(2):335-342. doi: 10.1097/ALN.0000000000002252. PMID:  73578978.  Kleris, [place]., & Lugar, P. L. (2019). Things We Do For No Reason: Failing to Question a Penicillin Allergy History. Journal of hospital medicine, 14(10), [phone]. Advance online publication. https://www.wallace-middleton.info/  Pichichero, M. E. (2006). Cephalosporins can be prescribed safely for penicillin-allergic patients. Journal of family medicine, 55(2), 106-112. Accessed: https://cdn.mdedge.com/files/s30f-public/Document/September-2017/5502JFP_AppliedEvidence1.pdf   BHonor Loh MSN, APRN, FNP-C, CEN [facility] Peri-operative Services Nurse Practitioner FAX: ([phone]/20/23 5:15 PM

## 2022-06-24 NOTE — Progress Notes (Signed)
  Perioperative Services Pre-Admission/Anesthesia Testing     Date: 06/24/22  Name: [name] MRN:   [id number]  Re: Change in Bernice for upcoming surgery   Case: [id number] Date/Time: 06/30/22 1206   Procedure: ARTERIOVENOUS (AV) FISTULA CREATION (BRACHIALCEPHALIC) (Left)   Anesthesia type: General   Pre-op diagnosis: ESRD   Location: [facility] OR ROOM 07 / [place] [facility]   Surgeons: Katha Cabal, MD   Primary attending surgeon was consulted regarding consideration of therapeutic change in antimicrobial agent being used for preoperative prophylaxis in this patient's upcoming surgical case. Following analysis of the risk versus benefits, the patient's primary attending surgeon advised that it would be acceptable to discontinue the ordered vancomycin and place an order for cefazolin 2 gm IV on call to the OR. Orders for this patient were amended by me following collaborative conversation with attending surgeon taking into consideration of risk versus benefits associated with the change in therapy.  Honor Loh, MSN, APRN, FNP-C, CEN [facility]  Peri-operative Services Nurse Practitioner Phone: [phone] 06/24/22 3:27 PM

## 2022-06-25 DIAGNOSIS — Z992 Dependence on renal dialysis: Secondary | ICD-10-CM | POA: Diagnosis not present

## 2022-06-25 DIAGNOSIS — N186 End stage renal disease: Secondary | ICD-10-CM | POA: Diagnosis not present

## 2022-06-26 DIAGNOSIS — Z992 Dependence on renal dialysis: Secondary | ICD-10-CM | POA: Diagnosis not present

## 2022-06-26 DIAGNOSIS — N186 End stage renal disease: Secondary | ICD-10-CM | POA: Diagnosis not present

## 2022-06-28 ENCOUNTER — Encounter
Admission: RE | Admit: 2022-06-28 | Discharge: 2022-06-28 | Disposition: A | Discharge status: Home / Self Care | Payer: 59 | Source: Ambulatory Visit | Attending: Vascular Surgery | Admitting: Vascular Surgery

## 2022-06-28 DIAGNOSIS — E875 Hyperkalemia: Secondary | ICD-10-CM | POA: Insufficient documentation

## 2022-06-28 DIAGNOSIS — Z01818 Encounter for other preprocedural examination: Secondary | ICD-10-CM

## 2022-06-28 DIAGNOSIS — N184 Chronic kidney disease, stage 4 (severe): Secondary | ICD-10-CM | POA: Diagnosis not present

## 2022-06-28 DIAGNOSIS — Z992 Dependence on renal dialysis: Secondary | ICD-10-CM | POA: Insufficient documentation

## 2022-06-28 DIAGNOSIS — Z01812 Encounter for preprocedural laboratory examination: Secondary | ICD-10-CM | POA: Insufficient documentation

## 2022-06-28 DIAGNOSIS — E1065 Type 1 diabetes mellitus with hyperglycemia: Secondary | ICD-10-CM | POA: Insufficient documentation

## 2022-06-28 DIAGNOSIS — N186 End stage renal disease: Secondary | ICD-10-CM | POA: Diagnosis not present

## 2022-06-28 LAB — BASIC METABOLIC PANEL
Anion gap: 16 — ABNORMAL HIGH (ref 5–15)
BUN: 78 mg/dL — ABNORMAL HIGH (ref 6–20)
CO2: 24 mmol/L (ref 22–32)
Calcium: 8.9 mg/dL (ref 8.9–10.3)
Chloride: 96 mmol/L — ABNORMAL LOW (ref 98–111)
Creatinine, Ser: 9.85 mg/dL — ABNORMAL HIGH (ref 0.61–1.24)
GFR, Estimated: 6 mL/min — ABNORMAL LOW (ref >60)
Glucose, Bld: 136 mg/dL — ABNORMAL HIGH (ref 70–99)
Potassium: 4.7 mmol/L (ref 3.5–5.1)
Sodium: 136 mmol/L (ref 135–145)

## 2022-06-28 LAB — TYPE AND SCREEN
ABO/RH(D): A POS
Antibody Screen: NEGATIVE

## 2022-06-29 DIAGNOSIS — N186 End stage renal disease: Secondary | ICD-10-CM | POA: Diagnosis not present

## 2022-06-29 DIAGNOSIS — Z992 Dependence on renal dialysis: Secondary | ICD-10-CM | POA: Diagnosis not present

## 2022-06-29 MED ORDER — CHLORHEXIDINE GLUCONATE CLOTH 2 % EX PADS: 6.0000 | MEDICATED_PAD | Freq: Once | CUTANEOUS | Status: DC

## 2022-06-29 MED ORDER — CHLORHEXIDINE GLUCONATE 0.12 % MT SOLN: 15.0000 mL | Freq: Once | OROMUCOSAL | Status: AC

## 2022-06-29 MED ORDER — ORAL CARE MOUTH RINSE: 15.0000 mL | Freq: Once | OROMUCOSAL | Status: AC

## 2022-06-29 MED ORDER — SODIUM CHLORIDE 0.9 % IV SOLN: INTRAVENOUS | Status: DC

## 2022-06-29 MED ORDER — CEFAZOLIN SODIUM-DEXTROSE 2-4 GM/100ML-% IV SOLN
2.0000 g | Freq: Once | INTRAVENOUS | Status: AC
  Administered 2022-06-30: 2 g via INTRAVENOUS

## 2022-06-30 ENCOUNTER — Ambulatory Visit: Payer: 59 | Admitting: Urgent Care

## 2022-06-30 ENCOUNTER — Other Ambulatory Visit: Payer: Self-pay

## 2022-06-30 ENCOUNTER — Ambulatory Visit
Admission: RE | Admit: 2022-06-30 | Discharge: 2022-06-30 | Disposition: A | Discharge status: Home / Self Care | Payer: 59 | Source: Ambulatory Visit | Attending: Vascular Surgery | Admitting: Vascular Surgery

## 2022-06-30 ENCOUNTER — Ambulatory Visit: Payer: 59

## 2022-06-30 ENCOUNTER — Encounter: Payer: Self-pay | Admitting: Vascular Surgery

## 2022-06-30 ENCOUNTER — Encounter
Admission: RE | Disposition: A | Discharge status: Home / Self Care | Payer: Self-pay | Source: Ambulatory Visit | Attending: Vascular Surgery

## 2022-06-30 DIAGNOSIS — Z992 Dependence on renal dialysis: Secondary | ICD-10-CM

## 2022-06-30 DIAGNOSIS — Z8711 Personal history of peptic ulcer disease: Secondary | ICD-10-CM | POA: Insufficient documentation

## 2022-06-30 DIAGNOSIS — E7800 Pure hypercholesterolemia, unspecified: Secondary | ICD-10-CM | POA: Diagnosis not present

## 2022-06-30 DIAGNOSIS — I120 Hypertensive chronic kidney disease with stage 5 chronic kidney disease or end stage renal disease: Secondary | ICD-10-CM | POA: Diagnosis not present

## 2022-06-30 DIAGNOSIS — E1022 Type 1 diabetes mellitus with diabetic chronic kidney disease: Secondary | ICD-10-CM | POA: Diagnosis not present

## 2022-06-30 DIAGNOSIS — Z79899 Other long term (current) drug therapy: Secondary | ICD-10-CM | POA: Diagnosis not present

## 2022-06-30 DIAGNOSIS — N186 End stage renal disease: Secondary | ICD-10-CM | POA: Insufficient documentation

## 2022-06-30 DIAGNOSIS — E1040 Type 1 diabetes mellitus with diabetic neuropathy, unspecified: Secondary | ICD-10-CM | POA: Diagnosis not present

## 2022-06-30 DIAGNOSIS — E875 Hyperkalemia: Secondary | ICD-10-CM

## 2022-06-30 DIAGNOSIS — G4730 Sleep apnea, unspecified: Secondary | ICD-10-CM | POA: Diagnosis not present

## 2022-06-30 DIAGNOSIS — Z794 Long term (current) use of insulin: Secondary | ICD-10-CM | POA: Diagnosis not present

## 2022-06-30 DIAGNOSIS — Z01818 Encounter for other preprocedural examination: Secondary | ICD-10-CM

## 2022-06-30 HISTORY — PX: AV FISTULA PLACEMENT: SHX1204

## 2022-06-30 LAB — POCT I-STAT, CHEM 8
BUN: 46 mg/dL — ABNORMAL HIGH (ref 6–20)
Calcium, Ion: 0.98 mmol/L — ABNORMAL LOW (ref 1.15–1.40)
Chloride: 98 mmol/L (ref 98–111)
Creatinine, Ser: 7.3 mg/dL — ABNORMAL HIGH (ref 0.61–1.24)
Glucose, Bld: 255 mg/dL — ABNORMAL HIGH (ref 70–99)
HCT: 36 % — ABNORMAL LOW (ref 39.0–52.0)
Hemoglobin: 12.2 g/dL — ABNORMAL LOW (ref 13.0–17.0)
Potassium: 4.8 mmol/L (ref 3.5–5.1)
Sodium: 132 mmol/L — ABNORMAL LOW (ref 135–145)
TCO2: 25 mmol/L (ref 22–32)

## 2022-06-30 LAB — ABO/RH: ABO/RH(D): A POS

## 2022-06-30 LAB — GLUCOSE, CAPILLARY: Glucose-Capillary: 304 mg/dL — ABNORMAL HIGH (ref 70–99)

## 2022-06-30 SURGERY — ARTERIOVENOUS (AV) FISTULA CREATION
Anesthesia: Monitor Anesthesia Care | Site: Arm Lower | Laterality: Left

## 2022-06-30 MED ORDER — OXYCODONE HCL 5 MG PO TABS
5.0000 mg | ORAL_TABLET | Freq: Once | ORAL | Status: AC
  Administered 2022-06-30: 5 mg via ORAL

## 2022-06-30 MED ORDER — PAPAVERINE HCL 30 MG/ML IJ SOLN
INTRAMUSCULAR | Status: AC
  Filled 2022-06-30: qty 2

## 2022-06-30 MED ORDER — HYDROCODONE-ACETAMINOPHEN 5-325 MG PO TABS
1.0000 | ORAL_TABLET | Freq: Four times a day (QID) | ORAL | 0 refills | Status: DC | PRN
  Filled 2022-06-30: qty 30, 4d supply, fill #0

## 2022-06-30 MED ORDER — ONDANSETRON HCL 4 MG/2ML IJ SOLN
INTRAMUSCULAR | Status: DC | PRN
  Administered 2022-06-30: 4 mg via INTRAVENOUS

## 2022-06-30 MED ORDER — HEPARIN SODIUM (PORCINE) 5000 UNIT/ML IJ SOLN
INTRAMUSCULAR | Status: AC
  Filled 2022-06-30: qty 1

## 2022-06-30 MED ORDER — FENTANYL CITRATE (PF) 100 MCG/2ML IJ SOLN
INTRAMUSCULAR | Status: AC
  Filled 2022-06-30: qty 2

## 2022-06-30 MED ORDER — HEMOSTATIC AGENTS (NO CHARGE) OPTIME
TOPICAL | Status: DC | PRN
  Administered 2022-06-30: 1 via TOPICAL

## 2022-06-30 MED ORDER — LIDOCAINE HCL (PF) 1 % IJ SOLN
INTRAMUSCULAR | Status: AC
  Filled 2022-06-30: qty 5

## 2022-06-30 MED ORDER — SODIUM CHLORIDE 0.9 % IV SOLN
INTRAVENOUS | Status: DC | PRN
  Administered 2022-06-30: 501 mL

## 2022-06-30 MED ORDER — FENTANYL CITRATE PF 50 MCG/ML IJ SOSY
PREFILLED_SYRINGE | INTRAMUSCULAR | Status: AC
  Administered 2022-06-30: 50 ug via INTRAVENOUS
  Filled 2022-06-30: qty 1

## 2022-06-30 MED ORDER — CEFAZOLIN SODIUM-DEXTROSE 2-4 GM/100ML-% IV SOLN
INTRAVENOUS | Status: AC
  Filled 2022-06-30: qty 100

## 2022-06-30 MED ORDER — PROPOFOL 1000 MG/100ML IV EMUL
INTRAVENOUS | Status: AC
  Filled 2022-06-30: qty 100

## 2022-06-30 MED ORDER — FENTANYL CITRATE PF 50 MCG/ML IJ SOSY: 50.0000 ug | PREFILLED_SYRINGE | INTRAMUSCULAR | Status: DC | PRN

## 2022-06-30 MED ORDER — LIDOCAINE HCL (PF) 1 % IJ SOLN
INTRAMUSCULAR | Status: DC | PRN
  Administered 2022-06-30: 4 mL via SUBCUTANEOUS

## 2022-06-30 MED ORDER — PHENYLEPHRINE HCL-NACL 20-0.9 MG/250ML-% IV SOLN
INTRAVENOUS | Status: AC
  Filled 2022-06-30: qty 250

## 2022-06-30 MED ORDER — MIDAZOLAM HCL 2 MG/2ML IJ SOLN
INTRAMUSCULAR | Status: AC
  Administered 2022-06-30: 1 mg via INTRAVENOUS
  Filled 2022-06-30: qty 2

## 2022-06-30 MED ORDER — ONDANSETRON HCL 4 MG/2ML IJ SOLN: 4.0000 mg | Freq: Four times a day (QID) | INTRAMUSCULAR | Status: DC | PRN

## 2022-06-30 MED ORDER — HYDROMORPHONE HCL 1 MG/ML IJ SOLN: 1.0000 mg | Freq: Once | INTRAMUSCULAR | Status: DC | PRN

## 2022-06-30 MED ORDER — PHENYLEPHRINE HCL-NACL 20-0.9 MG/250ML-% IV SOLN
INTRAVENOUS | Status: DC | PRN
  Administered 2022-06-30: 20 ug/min via INTRAVENOUS

## 2022-06-30 MED ORDER — LIDOCAINE HCL (PF) 2 % IJ SOLN
INTRAMUSCULAR | Status: AC
  Filled 2022-06-30: qty 5

## 2022-06-30 MED ORDER — MIDAZOLAM HCL 2 MG/2ML IJ SOLN: 1.0000 mg | INTRAMUSCULAR | Status: DC | PRN

## 2022-06-30 MED ORDER — LIDOCAINE HCL (CARDIAC) PF 100 MG/5ML IV SOSY
PREFILLED_SYRINGE | INTRAVENOUS | Status: DC | PRN
  Administered 2022-06-30: 100 mg via INTRAVENOUS

## 2022-06-30 MED ORDER — CHLORHEXIDINE GLUCONATE 0.12 % MT SOLN
OROMUCOSAL | Status: AC
  Administered 2022-06-30: 15 mL via OROMUCOSAL
  Filled 2022-06-30: qty 15

## 2022-06-30 MED ORDER — PROMETHAZINE HCL 25 MG/ML IJ SOLN: 6.2500 mg | INTRAMUSCULAR | Status: DC | PRN

## 2022-06-30 MED ORDER — OXYCODONE HCL 5 MG PO TABS
ORAL_TABLET | ORAL | Status: AC
  Filled 2022-06-30: qty 1

## 2022-06-30 MED ORDER — FENTANYL CITRATE (PF) 100 MCG/2ML IJ SOLN: 25.0000 ug | INTRAMUSCULAR | Status: DC | PRN

## 2022-06-30 MED ORDER — ROPIVACAINE HCL 5 MG/ML IJ SOLN
INTRAMUSCULAR | Status: AC
  Filled 2022-06-30: qty 30

## 2022-06-30 MED ORDER — ONDANSETRON HCL 4 MG/2ML IJ SOLN
INTRAMUSCULAR | Status: AC
  Filled 2022-06-30: qty 2

## 2022-06-30 MED ORDER — ROPIVACAINE HCL 5 MG/ML IJ SOLN
INTRAMUSCULAR | Status: DC | PRN
  Administered 2022-06-30: 30 mL via PERINEURAL

## 2022-06-30 MED ORDER — PROPOFOL 500 MG/50ML IV EMUL
INTRAVENOUS | Status: DC | PRN
  Administered 2022-06-30: 40 mg via INTRAVENOUS
  Administered 2022-06-30: 110 ug/kg/min via INTRAVENOUS

## 2022-06-30 SURGICAL SUPPLY — 51 items
ADH SKN CLS APL DERMABOND .7 (GAUZE/BANDAGES/DRESSINGS) ×1
APL PRP STRL LF DISP 70% ISPRP (MISCELLANEOUS) ×1
BAG DECANTER FOR FLEXI CONT (MISCELLANEOUS) ×1 IMPLANT
BLADE SURG SZ11 CARB STEEL (BLADE) ×1 IMPLANT
BOOT SUTURE AID YELLOW STND (SUTURE) ×1 IMPLANT
BRUSH SCRUB EZ  4% CHG (MISCELLANEOUS) ×1
BRUSH SCRUB EZ 4% CHG (MISCELLANEOUS) ×1 IMPLANT
CHLORAPREP W/TINT 26 (MISCELLANEOUS) ×1 IMPLANT
DERMABOND ADVANCED .7 DNX12 (GAUZE/BANDAGES/DRESSINGS) ×1 IMPLANT
DRESSING SURGICEL FIBRLLR 1X2 (HEMOSTASIS) ×1 IMPLANT
DRSG SURGICEL FIBRILLAR 1X2 (HEMOSTASIS) ×1
ELECT CAUTERY BLADE 6.4 (BLADE) ×1 IMPLANT
ELECT REM PT RETURN 9FT ADLT (ELECTROSURGICAL) ×1
ELECTRODE REM PT RTRN 9FT ADLT (ELECTROSURGICAL) ×1 IMPLANT
GLOVE SURG SYN 8.0 (GLOVE) ×1 IMPLANT
GLOVE SURG SYN 8.0 PF PI (GLOVE) ×1 IMPLANT
GOWN STRL REUS W/ TWL LRG LVL3 (GOWN DISPOSABLE) ×1 IMPLANT
GOWN STRL REUS W/ TWL XL LVL3 (GOWN DISPOSABLE) ×1 IMPLANT
GOWN STRL REUS W/TWL LRG LVL3 (GOWN DISPOSABLE) ×1
GOWN STRL REUS W/TWL XL LVL3 (GOWN DISPOSABLE) ×1
IV NS 500ML (IV SOLUTION) ×1
IV NS 500ML BAXH (IV SOLUTION) ×1 IMPLANT
KIT TURNOVER KIT A (KITS) ×1 IMPLANT
LABEL OR SOLS (LABEL) ×1 IMPLANT
LOOP RED MAXI  1X406MM (MISCELLANEOUS) ×1
LOOP VESSEL MAXI  1X406 RED (MISCELLANEOUS) ×1
LOOP VESSEL MAXI 1X406 RED (MISCELLANEOUS) ×1 IMPLANT
LOOP VESSEL MINI 0.8X406 BLUE (MISCELLANEOUS) ×2 IMPLANT
LOOPS BLUE MINI 0.8X406MM (MISCELLANEOUS) ×2
MANIFOLD NEPTUNE II (INSTRUMENTS) ×1 IMPLANT
NDL FILTER BLUNT 18X1 1/2 (NEEDLE) ×1 IMPLANT
NEEDLE FILTER BLUNT 18X1 1/2 (NEEDLE) ×1 IMPLANT
PACK EXTREMITY ARMC (MISCELLANEOUS) ×1 IMPLANT
PAD PREP 24X41 OB/GYN DISP (PERSONAL CARE ITEMS) ×1 IMPLANT
STOCKINETTE 48X4 2 PLY STRL (GAUZE/BANDAGES/DRESSINGS) ×1 IMPLANT
STOCKINETTE STRL 4IN 9604848 (GAUZE/BANDAGES/DRESSINGS) ×1 IMPLANT
SUT MNCRL+ 5-0 UNDYED PC-3 (SUTURE) ×1 IMPLANT
SUT MONOCRYL 5-0 (SUTURE) ×1
SUT PROLENE 6 0 BV (SUTURE) ×4 IMPLANT
SUT SILK 2 0 (SUTURE) ×1
SUT SILK 2-0 18XBRD TIE 12 (SUTURE) ×1 IMPLANT
SUT SILK 3 0 (SUTURE) ×1
SUT SILK 3-0 18XBRD TIE 12 (SUTURE) ×1 IMPLANT
SUT SILK 4 0 (SUTURE) ×1
SUT SILK 4-0 18XBRD TIE 12 (SUTURE) ×1 IMPLANT
SUT VIC AB 3-0 SH 27 (SUTURE) ×1
SUT VIC AB 3-0 SH 27X BRD (SUTURE) ×1 IMPLANT
SYR 20ML LL LF (SYRINGE) ×1 IMPLANT
SYR 3ML LL SCALE MARK (SYRINGE) ×1 IMPLANT
TRAP FLUID SMOKE EVACUATOR (MISCELLANEOUS) ×1 IMPLANT
WATER STERILE IRR 500ML POUR (IV SOLUTION) ×1 IMPLANT

## 2022-06-30 NOTE — Inpatient Diabetes Management (Signed)
Inpatient Diabetes Program Recommendations  AACE/ADA: New Consensus Statement on Inpatient Glycemic Control (2015)  Target Ranges:  Prepandial:   less than 140 mg/dL      Peak postprandial:   less than 180 mg/dL (1-2 hours)      Critically ill patients:  140 - 180 mg/dL   Lab Results  Component Value Date   GLUCAP 78 01/24/2021   HGBA1C 9.2 (A) 12/11/2021    Review of Glycemic Control  Diabetes history: DM1 (makes no insulin-needs basal, meal coverage, and correction) Outpatient Diabetes medications: Semglee 5 units, Humalog 3 units tid meal coverage, libre sensor for CGM Current orders for Inpatient glycemic control: In PACU  Inpatient Diabetes Program Recommendations:   Patient has type 1 diabetes and last office visit noted with Dr. Dwyane Dee was 12/28/21 @ which patient was on insulin pump. "INSULIN pump settings with the MINIMED 670 G pump:   Active basal rate #1: 12 AM = 0.3, 3 AM = 1.0, from 6 AM = 1.25, 10 AM = 1.50 and 8 PM = 1.3   Carbohydrate coverage 1: 7 at breakfast and suppertime and 1: 8 at lunch   Sensitivity 1: 40   Current target 110 and active insulin 5 hours   A1c is last 9.2"  Spoke with nurse in PACU via phone and current CBG 304. Patient only took 50% of his basal insulin last night, so will need additional basal insulin given.  Thank you, Nani Gasser. Marquavion Venhuizen, RN, MSN, CDE  Diabetes Coordinator Inpatient Glycemic Control Team Team Pager [phone] (8am-5pm) 06/30/2022 2:35 PM

## 2022-06-30 NOTE — Anesthesia Procedure Notes (Signed)
Anesthesia Regional Block: Supraclavicular block   Pre-Anesthetic Checklist: , timeout performed,  Correct Patient, Correct Site, Correct Laterality,  Correct Procedure, Correct Position, site marked,  Risks and benefits discussed,  Surgical consent,  Pre-op evaluation,  At surgeon's request and post-op pain management  Laterality: Left and Upper  Prep: chloraprep       Needles:  Injection technique: Single-shot  Needle Type: Stimiplex     Needle Length: 5cm  Needle Gauge: 22     Additional Needles:   Procedures:,,,, ultrasound used (permanent image in chart),,    Narrative:  Start time: 06/30/2022 12:07 PM End time: 06/30/2022 12:10 PM Injection made incrementally with aspirations every 5 mL.  Performed by: Personally  Anesthesiologist: Martha Clan, MD  Additional Notes: Functioning IV was confirmed and monitors were applied.  A 13m 22ga Stimuplex needle was used. Sterile prep and drape,hand hygiene and sterile gloves were used.  Negative aspiration and negative test dose prior to incremental administration of local anesthetic. The patient tolerated the procedure well.

## 2022-06-30 NOTE — Discharge Instructions (Signed)
AMBULATORY SURGERY  ?DISCHARGE INSTRUCTIONS ? ? ?The drugs that you were given will stay in your system until tomorrow so for the next 24 hours you should not: ? ?Drive an automobile ?Make any legal decisions ?Drink any alcoholic beverage ? ? ?You may resume regular meals tomorrow.  Today it is better to start with liquids and gradually work up to solid foods. ? ?You may eat anything you prefer, but it is better to start with liquids, then soup and crackers, and gradually work up to solid foods. ? ? ?Please notify your doctor immediately if you have any unusual bleeding, trouble breathing, redness and pain at the surgery site, drainage, fever, or pain not relieved by medication. ? ? ? ?Additional Instructions: ? ? ? ?Please contact your physician with any problems or Same Day Surgery at [phone], Monday through Friday 6 am to 4 pm, or [place] [facility] [place] Main number at [phone].  ?

## 2022-06-30 NOTE — Op Note (Signed)
     OPERATIVE NOTE   PROCEDURE: left brachial cephalic arteriovenous fistula placement  PRE-OPERATIVE DIAGNOSIS: End Stage Renal Disease  POST-OPERATIVE DIAGNOSIS: End Stage Renal Disease  SURGEON: Hortencia Pilar  ASSISTANT(S): None  ANESTHESIA: regional  ESTIMATED BLOOD LOSS: <50 cc  FINDING(S): 4 mm cephalic vein  SPECIMEN(S):  none  INDICATIONS:   [name] is a 51 y.o. male who presents with end stage renal disease.  The patient is scheduled for left brachiocephalic arteriovenous fistula placement.  The patient is aware the risks include but are not limited to: bleeding, infection, steal syndrome, nerve damage, ischemic monomelic neuropathy, failure to mature, and need for additional procedures.  The patient is aware of the risks of the procedure and elects to proceed forward.  DESCRIPTION: After full informed written consent was obtained from the patient, the patient was brought back to the operating room and placed supine upon the operating table.  Prior to induction, the patient received IV antibiotics.   After obtaining adequate anesthesia, the patient was then prepped and draped in the standard fashion for a left arm access procedure.   A first assistant was required to provide a safe and appropriate environment for executing the surgery.  The assistant was integral in providing retraction, exposure, running suture providing suction and in the closing process.   A curvilinear incision was then created midway between the radial impulse and the cephalic vein. The cephalic vein was then identified and dissected circumferentially. It was marked with a surgical marker.    Attention was then turned to the brachial artery which was exposed through the same incision and looped proximally and distally. Side branches were controlled with 4-0 silk ties.  The distal segment of the vein was ligated with a  2-0 silk, and the vein was transected.  The proximal segment was  interrogated with serial dilators.  The vein accepted up to a 4 mm dilator without any difficulty. Heparinized saline was infused into the vein and clamped it with a small bulldog.  At this point, I reset my exposure of the brachial artery and controlled the artery with vessel loops proximally and distally.  An arteriotomy was then made with a #11 blade, and extended with a Potts scissor.  Heparinized saline was injected proximal and distal into the radial artery.  The vein was then approximated to the artery while the artery was in its native bed and subsequently the vein was beveled using Potts scissors. The vein was then sewn to the artery in an end-to-side configuration with a running stitch of 6-0 Prolene.  Prior to completing this anastomosis Flushing maneuvers were performed and the artery was allowed to forward and back bleed.  There was no evidence of clot from any vessels.  I completed the anastomosis in the usual fashion and then released all vessel loops and clamps.    There was good  thrill in the venous outflow, and there was 1+ palpable radial pulse.  At this point, I irrigated out the surgical wound.  There was no further active bleeding.  The subcutaneous tissue was reapproximated with a running stitch of 3-0 Vicryl.  The skin was then reapproximated with a running subcuticular stitch of 4-0 Vicryl.  The skin was then cleaned, dried, and reinforced with Dermabond.    The patient tolerated this procedure well.   COMPLICATIONS: None  CONDITION: Margaret[facility] D[facility]  Office: [phone]   06/30/2022, 2:39 PM

## 2022-06-30 NOTE — Anesthesia Preprocedure Evaluation (Signed)
Anesthesia Evaluation  Patient identified by MRN, date of birth, ID band Patient awake    Reviewed: Allergy & Precautions, H&P , NPO status , Patient's Chart, lab work & pertinent test results, reviewed documented beta blocker date and time   History of Anesthesia Complications (+) PONV and history of anesthetic complications  Airway Mallampati: II  TM Distance: >3 FB Neck ROM: full    Dental  (+) Dental Advidsory Given   Pulmonary neg shortness of breath, sleep apnea and Continuous Positive Airway Pressure Ventilation , neg COPD, neg recent URI,    Pulmonary exam normal breath sounds clear to auscultation       Cardiovascular Exercise Tolerance: Good hypertension, (-) angina(-) Past MI and (-) Cardiac Stents Normal cardiovascular exam(-) dysrhythmias (-) Valvular Problems/Murmurs Rhythm:regular Rate:Normal     Neuro/Psych negative neurological ROS  negative psych ROS   GI/Hepatic Neg liver ROS, PUD, GERD  ,  Endo/Other  diabetesHypothyroidism   Renal/GU ESRF and DialysisRenal disease  negative genitourinary   Musculoskeletal   Abdominal   Peds  Hematology negative hematology ROS (+)   Anesthesia Other Findings Past Medical History: No date: Cellulitis No date: DKA (diabetic ketoacidosis) ([place]) No date: Esophageal dysphagia No date: ESRD (end stage renal disease) ([facility]) No date: Gastroparesis No date: Herniated cervical disc No date: HTN (hypertension) No date: Hypercholesteremia No date: Hypothyroidism No date: Morbid obesity ([place]) No date: OSA (obstructive sleep apnea) No date: Peritoneal dialysis status ([place]) No date: PONV (postoperative nausea and vomiting)     Comment:  after peritoneal dialysis catheter was placed No date: Presence of insulin pump No date: S/P cardiac cath     Comment:  a. 10-15 yrs ago at [facility] due to tachycardia, reportedly               normal. b. Normal ETT 03/2012. No date: Sepsis  ([place]) No date: Sinus tachycardia     Comment:  a. 24-hr Holter 03/2012 - SR, occ PVCs, no VT, avg HR               96bpm. No date: Substance abuse ([place]) No date: TIA (transient ischemic attack) No date: Type 1 diabetes mellitus ([place])     Comment:  a. With insulin pump.   Reproductive/Obstetrics negative OB ROS                             Anesthesia Physical Anesthesia Plan  ASA: 4  Anesthesia Plan: General   Post-op Pain Management: Regional block*   Induction: Intravenous  PONV Risk Score and Plan: 3 and Propofol infusion and TIVA  Airway Management Planned: Natural Airway and Simple Face Mask  Additional Equipment:   Intra-op Plan:   Post-operative Plan:   Informed Consent: I have reviewed the patients History and Physical, chart, labs and discussed the procedure including the risks, benefits and alternatives for the proposed anesthesia with the patient or authorized representative who has indicated his/her understanding and acceptance.     Dental Advisory Given  Plan Discussed with: Anesthesiologist, CRNA and Surgeon  Anesthesia Plan Comments:         Anesthesia Quick Evaluation

## 2022-06-30 NOTE — Transfer of Care (Signed)
Immediate Anesthesia Transfer of Care Note  Patient: OBED SAMEK  Procedure(s) Performed: ARTERIOVENOUS (AV) FISTULA CREATION (BRACHIALCEPHALIC) (Left: Arm Lower)  Patient Location: PACU  Anesthesia Type:MAC and Regional  Level of Consciousness: drowsy  Airway & Oxygen Therapy: Patient Spontanous Breathing and Patient connected to face mask oxygen  Post-op Assessment: Report given to RN and Post -op Vital signs reviewed and stable  Post vital signs: Reviewed and stable  Last Vitals:  Vitals Value Taken Time  BP 140/67 06/30/22 1421  Temp 35.8 1421  Pulse 73 06/30/22 1422  Resp 19 06/30/22 1422  SpO2 99 % 06/30/22 1422  Vitals shown include unvalidated device data.  Last Pain:  Vitals:   06/30/22 1129  TempSrc: Temporal  PainSc: 0-No pain         Complications: No notable events documented.

## 2022-06-30 NOTE — Interval H&P Note (Signed)
History and Physical Interval Note:  06/30/2022 11:38 AM  [name]  has presented today for surgery, with the diagnosis of ESRD.  The various methods of treatment have been discussed with the patient and family. After consideration of risks, benefits and other options for treatment, the patient has consented to  Procedure(s): ARTERIOVENOUS (AV) FISTULA CREATION (BRACHIALCEPHALIC) (Left) as a surgical intervention.  The patient's history has been reviewed, patient examined, no change in status, stable for surgery.  I have reviewed the patient's chart and labs.  Questions were answered to the patient's satisfaction.     Hortencia Pilar

## 2022-07-01 ENCOUNTER — Encounter: Payer: Self-pay | Admitting: Vascular Surgery

## 2022-07-01 NOTE — Anesthesia Postprocedure Evaluation (Signed)
Anesthesia Post Note  Patient: Hanzel Pizzo Pankonin  Procedure(s) Performed: ARTERIOVENOUS (AV) FISTULA CREATION (BRACHIALCEPHALIC) (Left: Arm Lower)  Patient location during evaluation: PACU Anesthesia Type: Regional Level of consciousness: awake and alert Pain management: pain level controlled Vital Signs Assessment: post-procedure vital signs reviewed and stable Respiratory status: spontaneous breathing, nonlabored ventilation, respiratory function stable and patient connected to nasal cannula oxygen Cardiovascular status: blood pressure returned to baseline and stable Postop Assessment: no apparent nausea or vomiting Anesthetic complications: no   No notable events documented.   Last Vitals:  Vitals:   06/30/22 1501 06/30/22 1519  BP: (!) 148/70 (!) 145/87  Pulse: 71 70  Resp: 10 20  Temp: 36.5 C 36.9 C  SpO2: 98% 96%    Last Pain:  Vitals:   06/30/22 1519  TempSrc: Temporal  PainSc: 0-No pain                 Martha Clan

## 2022-07-02 DIAGNOSIS — N186 End stage renal disease: Secondary | ICD-10-CM | POA: Diagnosis not present

## 2022-07-02 DIAGNOSIS — Z992 Dependence on renal dialysis: Secondary | ICD-10-CM | POA: Diagnosis not present

## 2022-07-03 DIAGNOSIS — N186 End stage renal disease: Secondary | ICD-10-CM | POA: Diagnosis not present

## 2022-07-03 DIAGNOSIS — Z992 Dependence on renal dialysis: Secondary | ICD-10-CM | POA: Diagnosis not present

## 2022-07-05 DIAGNOSIS — Z992 Dependence on renal dialysis: Secondary | ICD-10-CM | POA: Diagnosis not present

## 2022-07-05 DIAGNOSIS — N186 End stage renal disease: Secondary | ICD-10-CM | POA: Diagnosis not present

## 2022-07-06 DIAGNOSIS — N186 End stage renal disease: Secondary | ICD-10-CM | POA: Diagnosis not present

## 2022-07-06 DIAGNOSIS — Z992 Dependence on renal dialysis: Secondary | ICD-10-CM | POA: Diagnosis not present

## 2022-07-09 ENCOUNTER — Other Ambulatory Visit: Payer: Self-pay

## 2022-07-09 DIAGNOSIS — N186 End stage renal disease: Secondary | ICD-10-CM | POA: Diagnosis not present

## 2022-07-09 DIAGNOSIS — Z992 Dependence on renal dialysis: Secondary | ICD-10-CM | POA: Diagnosis not present

## 2022-07-09 MED ORDER — CINACALCET HCL 60 MG PO TABS
120.0000 mg | ORAL_TABLET | Freq: Every day | ORAL | 12 refills | Status: DC
  Filled 2022-07-09 – 2022-08-02 (×2): qty 60, 30d supply, fill #0
  Filled 2022-11-01: qty 60, 30d supply, fill #1

## 2022-07-12 ENCOUNTER — Other Ambulatory Visit: Payer: Self-pay

## 2022-07-12 DIAGNOSIS — N186 End stage renal disease: Secondary | ICD-10-CM | POA: Diagnosis not present

## 2022-07-12 DIAGNOSIS — Z992 Dependence on renal dialysis: Secondary | ICD-10-CM | POA: Diagnosis not present

## 2022-07-12 DIAGNOSIS — E8770 Fluid overload, unspecified: Secondary | ICD-10-CM | POA: Diagnosis not present

## 2022-07-12 DIAGNOSIS — E119 Type 2 diabetes mellitus without complications: Secondary | ICD-10-CM | POA: Diagnosis not present

## 2022-07-12 DIAGNOSIS — Z794 Long term (current) use of insulin: Secondary | ICD-10-CM | POA: Diagnosis not present

## 2022-07-13 ENCOUNTER — Emergency Department
Admission: EM | Admit: 2022-07-13 | Discharge: 2022-07-13 | Disposition: A | Discharge status: Home / Self Care | Payer: 59 | Attending: Emergency Medicine | Admitting: Emergency Medicine

## 2022-07-13 ENCOUNTER — Other Ambulatory Visit: Payer: Self-pay

## 2022-07-13 DIAGNOSIS — Z992 Dependence on renal dialysis: Secondary | ICD-10-CM | POA: Diagnosis not present

## 2022-07-13 DIAGNOSIS — I129 Hypertensive chronic kidney disease with stage 1 through stage 4 chronic kidney disease, or unspecified chronic kidney disease: Secondary | ICD-10-CM | POA: Diagnosis not present

## 2022-07-13 DIAGNOSIS — T8133XA Disruption of traumatic injury wound repair, initial encounter: Secondary | ICD-10-CM | POA: Insufficient documentation

## 2022-07-13 DIAGNOSIS — N189 Chronic kidney disease, unspecified: Secondary | ICD-10-CM | POA: Diagnosis not present

## 2022-07-13 DIAGNOSIS — W19XXXA Unspecified fall, initial encounter: Secondary | ICD-10-CM | POA: Diagnosis not present

## 2022-07-13 DIAGNOSIS — S51812A Laceration without foreign body of left forearm, initial encounter: Secondary | ICD-10-CM | POA: Diagnosis not present

## 2022-07-13 DIAGNOSIS — T8131XA Disruption of external operation (surgical) wound, not elsewhere classified, initial encounter: Secondary | ICD-10-CM | POA: Diagnosis not present

## 2022-07-13 NOTE — ED Triage Notes (Addendum)
Pt arrives with c/o small opening near his fistula that was put in about 2 weeks ago. Pt had a fall and when he was getting up the glue ripped. Pt has not taken nighttime BP meds.

## 2022-07-13 NOTE — ED Provider Triage Note (Signed)
  Emergency Medicine Provider Triage Evaluation Note  [name] , a 51 y.o.male,  was evaluated in triage.  Pt complains of fistula problem.  Patient states that he has a small opening in the skin near the fistula site in his left upper extremity.  He states that the fistula was placed about 2 weeks ago.  Reports that he had a fall was getting up when the glue surrounding it ripped.   Review of Systems  Positive: Laceration/skin opening. Negative: Denies fever, chest pain, vomiting  Physical Exam   Vitals:   07/13/22 2012  BP: (!) 226/110  Pulse: 82  Resp: 18  Temp: 98.4 F (36.9 C)  SpO2: 99%   Gen:   Awake, no distress   Resp:  Normal effort  MSK:   Moves extremities without difficulty  Other:    Medical Decision Making  Given the patient's initial medical screening exam, the following diagnostic evaluation has been ordered. The patient will be placed in the appropriate treatment space, once one is available, to complete the evaluation and treatment. I have discussed the plan of care with the patient and I have advised the patient that an ED physician or mid-level practitioner will reevaluate their condition after the test results have been received, as the results may give them additional insight into the type of treatment they may need.    Diagnostics: None immediately.  Treatments: none immediately   Teodoro Spray, [place] 07/13/22 2042

## 2022-07-13 NOTE — ED Provider Notes (Addendum)
[facility] Emergency Department Provider Note     Event Date/Time   First MD Initiated Contact with Patient 07/13/22 2200     (approximate)   History   Vascular Access Problem   HPI  [name] is a 51 y.o. male with a history of hypertension, DKA, CKD on dialysis, presents to the ED for evaluation of a small wound near his fistula.  Patient had a new AV fistula placed in his left upper extremity about 2 weeks prior.  Patient reports that he had a mechanical fall, resulting in the glue that was surrounding the incision site to separated.  He denies any persistent bleeding or bruising.  He also denies any purulence, fevers, or chills.     Physical Exam   Triage Vital Signs: ED Triage Vitals  Enc Vitals Group     BP 07/13/22 2012 (!) 226/110     Pulse Rate 07/13/22 2012 82     Resp 07/13/22 2012 18     Temp 07/13/22 2012 98.4 F (36.9 C)     Temp src --      SpO2 07/13/22 2012 99 %     Weight 07/13/22 2015 217 lb (98.4 kg)     Height --      Head Circumference --      Peak Flow --      Pain Score --      Pain Loc --      Pain Edu? --      Excl. in [place]? --     Most recent vital signs: Vitals:   07/13/22 2012  BP: (!) 226/110  Pulse: 82  Resp: 18  Temp: 98.4 F (36.9 C)  SpO2: 99%    General Awake, no distress.  CV:  Good peripheral perfusion.  Palpable thrill to left antecubital AV fistula RESP:  Normal effort.  ABD:  No distention.  SKIN:  Normal-appearing left antecubital suture site.  Medial aspect of the wound is dehisced approximately 1.3 cm.  No active bleeding is appreciated.  No erythema, warmth, tenderness is noted.   ED Results / Procedures / Treatments   Labs (all labs ordered are listed, but only abnormal results are displayed) Labs Reviewed - No data to display   [place]   ED Provider Interpretation:   No results found.   PROCEDURES:  Critical Care performed: No  ..Laceration  Repair  Date/Time: 07/13/2022 10:10 PM  Performed by: Melvenia Needles, PA-C Authorized by: Melvenia Needles, PA-C   Consent:    Consent obtained:  Verbal   Consent given by:  Patient   Risks, benefits, and alternatives were discussed: yes     Risks discussed:  Pain and poor wound healing   Alternatives discussed:  No treatment Universal protocol:    Site/side marked: yes     Patient identity confirmed:  Verbally with patient Anesthesia:    Anesthesia method:  None Laceration details:    Location:  Shoulder/arm   Shoulder/arm location:  L lower arm   Length (cm):  1.3   Depth (mm):  5 Pre-procedure details:    Preparation:  Patient was prepped and draped in usual sterile fashion Exploration:    Limited defect created (wound extended): no     Contaminated: no   Treatment:    Area cleansed with:  Chlorhexidine   Amount of cleaning:  Standard   Debridement:  None   Undermining:  None  Scar revision: no   Skin repair:    Repair method: DermaClip. Approximation:    Approximation:  Close Repair type:    Repair type:  Simple Post-procedure details:    Dressing:  Non-adherent dressing and sterile dressing   Procedure completion:  Tolerated well, no immediate complications    MEDICATIONS ORDERED IN ED: Medications - No data to display   IMPRESSION / MDM / [place] / ED COURSE  I reviewed the triage vital signs and the nursing notes.                              Differential diagnosis includes, but is not limited to, wound dehiscence, cellulitis, AV fistula failure, hematoma  Patient's presentation is most consistent with acute, uncomplicated illness.  Patient's diagnosis is consistent with surgical site wound dehiscence. Patient will be discharged home with wound care supplies. Patient is to follow up with vascular surgery as scheduled, as needed or otherwise directed. Patient is given ED precautions to return to the ED for any worsening or  new symptoms.     FINAL CLINICAL IMPRESSION(S) / ED DIAGNOSES   Final diagnoses:  Traumatic wound dehiscence, initial encounter     Rx / DC Orders   ED Discharge Orders     None        Note:  This document was prepared using Dragon voice recognition software and may include unintentional dictation errors.    Melvenia Needles, PA-C 07/13/22 2341    Melvenia Needles, PA-C 07/13/22 2342    Rada Hay, MD 07/15/22 [phone]

## 2022-07-13 NOTE — Discharge Instructions (Addendum)
Keep the wound clean, dry, and covered.  Follow-up with Dr. Delana Meyer as scheduled.

## 2022-07-14 ENCOUNTER — Other Ambulatory Visit (INDEPENDENT_AMBULATORY_CARE_PROVIDER_SITE_OTHER): Payer: Self-pay | Admitting: Vascular Surgery

## 2022-07-14 DIAGNOSIS — Z9889 Other specified postprocedural states: Secondary | ICD-10-CM

## 2022-07-14 DIAGNOSIS — N186 End stage renal disease: Secondary | ICD-10-CM | POA: Diagnosis not present

## 2022-07-14 DIAGNOSIS — Z992 Dependence on renal dialysis: Secondary | ICD-10-CM | POA: Diagnosis not present

## 2022-07-14 DIAGNOSIS — E8770 Fluid overload, unspecified: Secondary | ICD-10-CM | POA: Diagnosis not present

## 2022-07-15 ENCOUNTER — Encounter (INDEPENDENT_AMBULATORY_CARE_PROVIDER_SITE_OTHER): Payer: 59

## 2022-07-16 ENCOUNTER — Ambulatory Visit (INDEPENDENT_AMBULATORY_CARE_PROVIDER_SITE_OTHER): Payer: 59 | Admitting: Nurse Practitioner

## 2022-07-16 ENCOUNTER — Encounter (INDEPENDENT_AMBULATORY_CARE_PROVIDER_SITE_OTHER): Payer: Self-pay | Admitting: Nurse Practitioner

## 2022-07-16 VITALS — BP 165/54 | HR 67 | Resp 19 | Ht 70.0 in | Wt 228.8 lb

## 2022-07-16 DIAGNOSIS — N186 End stage renal disease: Secondary | ICD-10-CM | POA: Diagnosis not present

## 2022-07-16 DIAGNOSIS — E8770 Fluid overload, unspecified: Secondary | ICD-10-CM | POA: Diagnosis not present

## 2022-07-16 DIAGNOSIS — N184 Chronic kidney disease, stage 4 (severe): Secondary | ICD-10-CM

## 2022-07-16 DIAGNOSIS — Z992 Dependence on renal dialysis: Secondary | ICD-10-CM | POA: Diagnosis not present

## 2022-07-17 ENCOUNTER — Encounter (INDEPENDENT_AMBULATORY_CARE_PROVIDER_SITE_OTHER): Payer: Self-pay | Admitting: Nurse Practitioner

## 2022-07-17 DIAGNOSIS — Z992 Dependence on renal dialysis: Secondary | ICD-10-CM | POA: Diagnosis not present

## 2022-07-17 DIAGNOSIS — N186 End stage renal disease: Secondary | ICD-10-CM | POA: Diagnosis not present

## 2022-07-17 DIAGNOSIS — E8770 Fluid overload, unspecified: Secondary | ICD-10-CM | POA: Diagnosis not present

## 2022-07-17 NOTE — Progress Notes (Signed)
Subjective:    Patient ID: [name], male    DOB: 1971/06/03, 51 y.o.   MRN: [id number] No chief complaint on file.   HPI  Review of Systems     Objective:   Physical Exam  BP (!) 165/54 (BP Location: Right Arm)   Pulse 67   Resp 19   Ht 5' 10"  (1.778 m)   Wt 228 lb 12.8 oz (103.8 kg)   BMI 32.83 kg/m   Past Medical History:  Diagnosis Date   Cellulitis    DKA (diabetic ketoacidosis) (HCC)    Esophageal dysphagia    ESRD (end stage renal disease) (HCC)    Gastroparesis    Herniated cervical disc    HTN (hypertension)    Hypercholesteremia    Hypothyroidism    Morbid obesity (HCC)    OSA (obstructive sleep apnea)    Peritoneal dialysis status (HCC)    PONV (postoperative nausea and vomiting)    after peritoneal dialysis catheter was placed   Presence of insulin pump    S/P cardiac cath    a. 10-15 yrs ago at [facility] due to tachycardia, reportedly normal. b. Normal ETT 03/2012.   Sepsis ([place])    Sinus tachycardia    a. 24-hr Holter 03/2012 - SR, occ PVCs, no VT, avg HR 96bpm.   Substance abuse (HCC)    TIA (transient ischemic attack)    Type 1 diabetes mellitus ([place])    a. With insulin pump.    Social History   Socioeconomic History   Marital status: Married    Spouse name: Not on file   Number of children: Not on file   Years of education: Not on file   Highest education level: Not on file  Occupational History   Not on file  Tobacco Use   Smoking status: Never   Smokeless tobacco: Never  Vaping Use   Vaping Use: Never used  Substance and Sexual Activity   Alcohol use: No   Drug use: No   Sexual activity: Yes  Other Topics Concern   Not on file  Social History Narrative   Not on file   Social Determinants of Health   Financial Resource Strain: Not on file  Food Insecurity: Not on file  Transportation Needs: Not on file  Physical Activity: Not on file  Stress: Not on file  Social Connections: Not on file  Intimate Partner Violence:  Not on file    Past Surgical History:  Procedure Laterality Date   AV FISTULA PLACEMENT Left 06/30/2022   Procedure: ARTERIOVENOUS (AV) FISTULA CREATION (BRACHIALCEPHALIC);  Surgeon: Katha Cabal, MD;  Location: [facility] ORS;  Service: Vascular;  Laterality: Left;   BIOPSY  10/03/2019   Procedure: BIOPSY;  Surgeon: Mauri Pole, MD;  Location: [facility];  Service: Endoscopy;;   CATARACT EXTRACTION Bilateral    cervical neck fusion     COLONOSCOPY WITH PROPOFOL N/A 01/02/2021   Procedure: COLONOSCOPY WITH PROPOFOL;  Surgeon: [name], MD;  Location: [place];  Service: Endoscopy;  Laterality: N/A;  priority 4 DIABETIC needs potassium draw   ESOPHAGOGASTRODUODENOSCOPY (EGD) WITH PROPOFOL N/A 10/03/2019   Procedure: ESOPHAGOGASTRODUODENOSCOPY (EGD) WITH PROPOFOL;  Surgeon: Mauri Pole, MD;  Location: [facility];  Service: Endoscopy;  Laterality: N/A;   EYE SURGERY Bilateral    for floaters   HERNIA REPAIR     bilateral   LEFT HEART CATH AND CORONARY ANGIOGRAPHY Left 10/16/2021   Procedure: LEFT HEART CATH AND CORONARY ANGIOGRAPHY;  Surgeon: Wellington Hampshire, MD;  Location: [place] [facility];  Service: Cardiovascular;  Laterality: Left;   LEFT HEART CATHETERIZATION WITH CORONARY ANGIOGRAM N/A 12/18/2013   Procedure: LEFT HEART CATHETERIZATION WITH CORONARY ANGIOGRAM;  Surgeon: [name] M Martinique, MD;  Location: [facility];  Service: Cardiovascular;  Laterality: N/A;   PERITONEAL CATHETER INSERTION     PERITONEAL CATHETER REMOVAL     POLYPECTOMY  01/02/2021   Procedure: POLYPECTOMY;  Surgeon: [name], MD;  Location: [facility];  Service: Endoscopy;;    Family History  Problem Relation Age of Onset   Hypertension Other    Coronary artery disease Other        Mother's side - both her side's grandparents died of heart disease (MIs)   Diabetes Other    Stroke Other        Paternal grandfather (82)   Prostate cancer Neg Hx    Bladder  Cancer Neg Hx    Kidney cancer Neg Hx     Allergies  Allergen Reactions   Sulfa Antibiotics Anaphylaxis and Swelling    Pt is not allergic to iodine, has had iodine in the past w/o premeds and w/ no problems   Oxycontin [Oxycodone Hcl] Nausea And Vomiting   Penicillins Other (See Comments)    Tolerated 1st generation cephalosporin (CEFAZOLIN) on 05/29/2020 without documented ADRs.   Possible reaction many years ago per patient.  Has had PCN without issues since time of "reaction".    Prednisone Other (See Comments)    Patient is diabetic, runs sugar up     Shellfish-Derived Products Swelling    Eyes swell with shellfish Pt is not allergic to iodine, has had iodine in the past w/o premeds and w/ no problem   Pregabalin Other (See Comments)    Muscle twitching and jerks       Latest Ref Rng & Units 06/30/2022   11:32 AM 10/13/2021   12:18 PM 01/22/2021    5:03 AM  CBC  WBC 3.4 - 10.8 x10E3/uL  6.0    Hemoglobin 13.0 - 17.0 g/dL 12.2  10.6  16.0   Hematocrit 39.0 - 52.0 % 36.0  32.3  47.0   Platelets 150 - 450 x10E3/uL  272        CMP     Component Value Date/Time   NA 132 (L) 06/30/2022 1132   NA 136 10/13/2021 1218   K 4.8 06/30/2022 1132   CL 98 06/30/2022 1132   CO2 24 06/28/2022 0834   GLUCOSE 255 (H) 06/30/2022 1132   BUN 46 (H) 06/30/2022 1132   BUN 99 (HH) 10/13/2021 1218   CREATININE 7.30 (H) 06/30/2022 1132   CALCIUM 8.9 06/28/2022 0834   PROT 6.8 01/22/2021 0442   PROT 5.8 (L) 10/10/2019 1037   ALBUMIN 3.3 (L) 01/22/2021 0442   ALBUMIN 3.3 (L) 10/10/2019 1037   AST 47 (H) 01/22/2021 0442   ALT 26 01/22/2021 0442   ALKPHOS 79 01/22/2021 0442   BILITOT 1.0 01/22/2021 0442   BILITOT 0.3 10/10/2019 1037   GFRNONAA 6 (L) 06/28/2022 0834   GFRAA 18 (L) 01/28/2020 0719     No results found.     Assessment & Plan:   1. Chronic kidney disease, stage IV (severe) (HCC) Patient's incision is clean dry and intact with no evidence of infection.  It is  securely closed with butterfly strips in place.  We will have patient return in 3 weeks for noninvasive studies.   Current Outpatient Medications  on File Prior to Visit  Medication Sig Dispense Refill   acetaminophen (TYLENOL) 500 MG tablet Take 500-1,000 mg by mouth every 6 (six) hours as needed for mild pain or fever.     ALPRAZolam (XANAX) 0.25 MG tablet Take 1 tablet (0.25 mg total) by mouth 2 (two) times daily as needed for Sleep for up to 60 days 60 tablet 1   amLODipine (NORVASC) 10 MG tablet TAKE 1 TABLET BY MOUTH ONCE DAILY (Patient taking differently: Take 10 mg by mouth every morning.) 30 tablet 11   atorvastatin (LIPITOR) 80 MG tablet TAKE 1 TABLET BY MOUTH ONCE DAILY (Patient taking differently: Take 80 mg by mouth daily before lunch.) 30 tablet 11   carvedilol (COREG) 25 MG tablet Take one tablet by mouth 2 times daily with meals 360 tablet 0   cinacalcet (SENSIPAR) 60 MG tablet Take 1 tablet (60 mg total) by mouth daily. 90 tablet 3   cinacalcet (SENSIPAR) 60 MG tablet Take 2 tablets (120 mg total) by mouth daily. 60 tablet 12   cloNIDine (CATAPRES) 0.1 MG tablet Take 0.2 mg by mouth 2 (two) times daily.     Continuous Blood Gluc Sensor (DEXCOM G6 SENSOR) MISC Change every 10 days 9 each 2   Continuous Blood Gluc Sensor (FREESTYLE LIBRE 3 SENSOR) MISC Apply 1 sensor on upper arm every 14 days for continuous glucose monitoring 2 each 2   Continuous Blood Gluc Transmit (DEXCOM G6 TRANSMITTER) MISC Change every 3 months 1 each 2   Glucagon, rDNA, (GLUCAGON EMERGENCY) 1 MG KIT Inject into the muscle as directed for severe sugar. 2 kit 0   HYDROcodone-acetaminophen (NORCO) 5-325 MG tablet Take 1-2 tablets by mouth every 6 (six) hours as needed for moderate pain or severe pain. 30 tablet 0   insulin glargine-yfgn (SEMGLEE) 100 UNIT/ML Pen Inject 5 Units into the skin nightly. 15 mL 0   insulin lispro (HUMALOG) 100 UNIT/ML injection Inject 3 Units into the skin 3 (three) times daily before  meals.     Insulin Pen Needle (UNIFINE PENTIPS) 31G X 5 MM MISC Inject 1 Syringe into the skin 4 times daily. Use to inject insulin as prescribed. 100 each 3   losartan (COZAAR) 100 MG tablet TAKE 1 TABLET BY MOUTH ONCE DAILY 30 tablet 11   minoxidil (LONITEN) 2.5 MG tablet Take 1 Tablet Oral as directed (Patient taking differently: Take 2.5 mg by mouth every evening.) 30 tablet 11   ondansetron (ZOFRAN) 4 MG tablet Take 1 tablet by mouth every 8 hours as needed for nausea or vomiting. 20 tablet 0   pantoprazole (PROTONIX) 40 MG tablet Take 1 tablet (40 mg total) by mouth once daily (Patient taking differently: Take 40 mg by mouth every morning.) 30 tablet 11   sucralfate (CARAFATE) 1 g tablet Take 1 tablet (1 g total) by mouth 4 (four) times daily before meals and nightly 120 tablet 11   sucroferric oxyhydroxide (VELPHORO) 500 MG chewable tablet Chew 2 tablets by mouth 3 times a day with food 180 tablet 12   losartan (COZAAR) 100 MG tablet TAKE 1 TABLET BY MOUTH ONCE DAILY (Patient not taking: Reported on 10/08/2021) 30 tablet 11   torsemide (DEMADEX) 100 MG tablet Take 1 tablet by mouth once a day (Patient not taking: Reported on 06/23/2022) 90 tablet 3   [DISCONTINUED] promethazine (PHENERGAN) 25 MG tablet Take 1/2 (12.53m) to 1 (217m tab as needed for nausea and vomiting not controlled with Zofran up to every 6 hours.  30 tablet 0   No current facility-administered medications on file prior to visit.    There are no Patient Instructions on file for this visit. No follow-ups on file.   Kris Hartmann, NP

## 2022-07-19 DIAGNOSIS — Z992 Dependence on renal dialysis: Secondary | ICD-10-CM | POA: Diagnosis not present

## 2022-07-19 DIAGNOSIS — Z23 Encounter for immunization: Secondary | ICD-10-CM | POA: Diagnosis not present

## 2022-07-19 DIAGNOSIS — N186 End stage renal disease: Secondary | ICD-10-CM | POA: Diagnosis not present

## 2022-07-20 ENCOUNTER — Ambulatory Visit (INDEPENDENT_AMBULATORY_CARE_PROVIDER_SITE_OTHER): Payer: Medicare Other | Admitting: Nurse Practitioner

## 2022-07-20 DIAGNOSIS — N186 End stage renal disease: Secondary | ICD-10-CM | POA: Diagnosis not present

## 2022-07-20 DIAGNOSIS — E1022 Type 1 diabetes mellitus with diabetic chronic kidney disease: Secondary | ICD-10-CM | POA: Diagnosis not present

## 2022-07-20 DIAGNOSIS — Z794 Long term (current) use of insulin: Secondary | ICD-10-CM | POA: Diagnosis not present

## 2022-07-20 DIAGNOSIS — E1042 Type 1 diabetes mellitus with diabetic polyneuropathy: Secondary | ICD-10-CM | POA: Diagnosis not present

## 2022-07-20 DIAGNOSIS — E10319 Type 1 diabetes mellitus with unspecified diabetic retinopathy without macular edema: Secondary | ICD-10-CM | POA: Diagnosis not present

## 2022-07-20 DIAGNOSIS — Z01818 Encounter for other preprocedural examination: Secondary | ICD-10-CM | POA: Diagnosis not present

## 2022-07-20 DIAGNOSIS — I120 Hypertensive chronic kidney disease with stage 5 chronic kidney disease or end stage renal disease: Secondary | ICD-10-CM | POA: Diagnosis not present

## 2022-07-20 DIAGNOSIS — Z992 Dependence on renal dialysis: Secondary | ICD-10-CM | POA: Diagnosis not present

## 2022-07-21 DIAGNOSIS — Z992 Dependence on renal dialysis: Secondary | ICD-10-CM | POA: Diagnosis not present

## 2022-07-21 DIAGNOSIS — N186 End stage renal disease: Secondary | ICD-10-CM | POA: Diagnosis not present

## 2022-07-21 DIAGNOSIS — Z23 Encounter for immunization: Secondary | ICD-10-CM | POA: Diagnosis not present

## 2022-07-23 DIAGNOSIS — Z992 Dependence on renal dialysis: Secondary | ICD-10-CM | POA: Diagnosis not present

## 2022-07-23 DIAGNOSIS — Z23 Encounter for immunization: Secondary | ICD-10-CM | POA: Diagnosis not present

## 2022-07-23 DIAGNOSIS — N186 End stage renal disease: Secondary | ICD-10-CM | POA: Diagnosis not present

## 2022-07-26 ENCOUNTER — Emergency Department
Admission: EM | Admit: 2022-07-26 | Discharge: 2022-07-26 | Disposition: A | Discharge status: Home / Self Care | Payer: 59 | Attending: Emergency Medicine | Admitting: Emergency Medicine

## 2022-07-26 DIAGNOSIS — Y69 Unspecified misadventure during surgical and medical care: Secondary | ICD-10-CM | POA: Insufficient documentation

## 2022-07-26 DIAGNOSIS — T829XXA Unspecified complication of cardiac and vascular prosthetic device, implant and graft, initial encounter: Secondary | ICD-10-CM | POA: Insufficient documentation

## 2022-07-26 DIAGNOSIS — N186 End stage renal disease: Secondary | ICD-10-CM | POA: Diagnosis not present

## 2022-07-26 DIAGNOSIS — T8249XA Other complication of vascular dialysis catheter, initial encounter: Secondary | ICD-10-CM | POA: Diagnosis not present

## 2022-07-26 DIAGNOSIS — Z992 Dependence on renal dialysis: Secondary | ICD-10-CM | POA: Diagnosis not present

## 2022-07-26 NOTE — ED Triage Notes (Signed)
First Nurse Note;  Pt via [place] from [facility]. Pt was getting blood work drawn before his treatment this AM. The Vacutainer stuck on the dialysis catheter and per staff they were unable to get it off. Staff states if would could get it off and send him back for dialysis.

## 2022-07-26 NOTE — ED Provider Notes (Signed)
   [facility] Provider Note    None    (approximate)   History   Vascular Access Problem   HPI  [name] is a 52 y.o. male who was attending his regular scheduled dialysis today, had blood draw prior to dialysis, nurses were unable to remove the Vacutainer from his chest catheter.  He has no physical complaints.     Physical Exam   Triage Vital Signs: ED Triage Vitals  Enc Vitals Group     BP 07/26/22 1309 (!) 197/79     Pulse Rate 07/26/22 1309 61     Resp 07/26/22 1309 16     Temp 07/26/22 1309 97.9 F (36.6 C)     Temp Source 07/26/22 1309 Oral     SpO2 07/26/22 1309 100 %     Weight 07/26/22 1302 100.9 kg (222 lb 7.1 oz)     Height 07/26/22 1302 1.803 m ('5\' 11"'$ )     Head Circumference --      Peak Flow --      Pain Score 07/26/22 1302 0     Pain Loc --      Pain Edu? --      Excl. in [place]? --     Most recent vital signs: Vitals:   07/26/22 1309  BP: (!) 197/79  Pulse: 61  Resp: 16  Temp: 97.9 F (36.6 C)  SpO2: 100%     General: Awake, no distress.  CV:  Good peripheral perfusion.  Resp:  Normal effort.  Abd:  No distention.  Other:     ED Results / Procedures / Treatments   Labs (all labs ordered are listed, but only abnormal results are displayed) Labs Reviewed - No data to display   EKG     RADIOLOGY     PROCEDURES:  Critical Care performed:   Procedures   MEDICATIONS ORDERED IN ED: Medications - No data to display   IMPRESSION / MDM / [place] / ED COURSE  I reviewed the triage vital signs and the nursing notes. Patient's presentation is most consistent with acute, uncomplicated illness.   Vacutainer was removed by our nurses using hemostat, appropriate for discharge with continuation of dialysis       FINAL CLINICAL IMPRESSION(S) / ED DIAGNOSES   Final diagnoses:  Complication associated with dialysis catheter     Rx / DC Orders   ED Discharge Orders      None        Note:  This document was prepared using Dragon voice recognition software and may include unintentional dictation errors.   Lavonia Drafts, MD 07/26/22 1323

## 2022-07-26 NOTE — ED Triage Notes (Signed)
Pt from [facility] following Dialysis---C/C needs a vacutainer removed from Dialysis port. Pt denies pain. Pt has not had Dialysis in 3 days, pt typically gets dialysis 2x/week.

## 2022-07-27 ENCOUNTER — Other Ambulatory Visit: Payer: Self-pay

## 2022-07-27 DIAGNOSIS — N186 End stage renal disease: Secondary | ICD-10-CM | POA: Diagnosis not present

## 2022-07-27 DIAGNOSIS — Z992 Dependence on renal dialysis: Secondary | ICD-10-CM | POA: Diagnosis not present

## 2022-07-27 MED ORDER — TRAZODONE HCL 50 MG PO TABS
50.0000 mg | ORAL_TABLET | Freq: Every day | ORAL | 1 refills | Status: DC
  Filled 2022-07-27: qty 30, 30d supply, fill #0

## 2022-07-29 DIAGNOSIS — N186 End stage renal disease: Secondary | ICD-10-CM | POA: Diagnosis not present

## 2022-07-29 DIAGNOSIS — Z992 Dependence on renal dialysis: Secondary | ICD-10-CM | POA: Diagnosis not present

## 2022-07-30 ENCOUNTER — Other Ambulatory Visit: Payer: Self-pay

## 2022-07-30 DIAGNOSIS — N186 End stage renal disease: Secondary | ICD-10-CM | POA: Diagnosis not present

## 2022-07-30 DIAGNOSIS — Z992 Dependence on renal dialysis: Secondary | ICD-10-CM | POA: Diagnosis not present

## 2022-08-02 ENCOUNTER — Other Ambulatory Visit: Payer: Self-pay | Admitting: Endocrinology

## 2022-08-02 ENCOUNTER — Encounter (INDEPENDENT_AMBULATORY_CARE_PROVIDER_SITE_OTHER): Payer: Self-pay

## 2022-08-02 ENCOUNTER — Other Ambulatory Visit: Payer: Self-pay

## 2022-08-02 DIAGNOSIS — N186 End stage renal disease: Secondary | ICD-10-CM | POA: Diagnosis not present

## 2022-08-02 DIAGNOSIS — Z992 Dependence on renal dialysis: Secondary | ICD-10-CM | POA: Diagnosis not present

## 2022-08-03 ENCOUNTER — Other Ambulatory Visit: Payer: Self-pay

## 2022-08-03 ENCOUNTER — Other Ambulatory Visit: Payer: Self-pay | Admitting: Endocrinology

## 2022-08-03 DIAGNOSIS — Z992 Dependence on renal dialysis: Secondary | ICD-10-CM | POA: Diagnosis not present

## 2022-08-03 DIAGNOSIS — N186 End stage renal disease: Secondary | ICD-10-CM | POA: Diagnosis not present

## 2022-08-03 MED ORDER — CLONIDINE HCL 0.2 MG PO TABS
0.2000 mg | ORAL_TABLET | Freq: Two times a day (BID) | ORAL | 11 refills | Status: DC
  Filled 2022-08-03: qty 60, 30d supply, fill #0
  Filled 2022-09-09: qty 180, 90d supply, fill #1

## 2022-08-04 DIAGNOSIS — N186 End stage renal disease: Secondary | ICD-10-CM | POA: Diagnosis not present

## 2022-08-04 DIAGNOSIS — Z992 Dependence on renal dialysis: Secondary | ICD-10-CM | POA: Diagnosis not present

## 2022-08-05 ENCOUNTER — Ambulatory Visit (INDEPENDENT_AMBULATORY_CARE_PROVIDER_SITE_OTHER): Payer: Medicare Other | Admitting: Nurse Practitioner

## 2022-08-05 ENCOUNTER — Other Ambulatory Visit: Payer: Self-pay

## 2022-08-05 ENCOUNTER — Encounter (INDEPENDENT_AMBULATORY_CARE_PROVIDER_SITE_OTHER): Payer: 59

## 2022-08-06 ENCOUNTER — Other Ambulatory Visit: Payer: Self-pay

## 2022-08-06 DIAGNOSIS — N186 End stage renal disease: Secondary | ICD-10-CM | POA: Diagnosis not present

## 2022-08-06 DIAGNOSIS — Z992 Dependence on renal dialysis: Secondary | ICD-10-CM | POA: Diagnosis not present

## 2022-08-09 DIAGNOSIS — Z794 Long term (current) use of insulin: Secondary | ICD-10-CM | POA: Diagnosis not present

## 2022-08-09 DIAGNOSIS — Z9641 Presence of insulin pump (external) (internal): Secondary | ICD-10-CM | POA: Diagnosis not present

## 2022-08-09 DIAGNOSIS — Z823 Family history of stroke: Secondary | ICD-10-CM | POA: Diagnosis not present

## 2022-08-09 DIAGNOSIS — Z7682 Awaiting organ transplant status: Secondary | ICD-10-CM | POA: Diagnosis not present

## 2022-08-09 DIAGNOSIS — Z800 Family history of malignant neoplasm of digestive organs: Secondary | ICD-10-CM | POA: Diagnosis not present

## 2022-08-09 DIAGNOSIS — I120 Hypertensive chronic kidney disease with stage 5 chronic kidney disease or end stage renal disease: Secondary | ICD-10-CM | POA: Diagnosis not present

## 2022-08-09 DIAGNOSIS — E1022 Type 1 diabetes mellitus with diabetic chronic kidney disease: Secondary | ICD-10-CM | POA: Diagnosis not present

## 2022-08-09 DIAGNOSIS — Z79899 Other long term (current) drug therapy: Secondary | ICD-10-CM | POA: Diagnosis not present

## 2022-08-09 DIAGNOSIS — Z992 Dependence on renal dialysis: Secondary | ICD-10-CM | POA: Diagnosis not present

## 2022-08-09 DIAGNOSIS — N186 End stage renal disease: Secondary | ICD-10-CM | POA: Diagnosis not present

## 2022-08-10 DIAGNOSIS — N186 End stage renal disease: Secondary | ICD-10-CM | POA: Diagnosis not present

## 2022-08-10 DIAGNOSIS — Z7682 Awaiting organ transplant status: Secondary | ICD-10-CM | POA: Diagnosis not present

## 2022-08-10 DIAGNOSIS — I120 Hypertensive chronic kidney disease with stage 5 chronic kidney disease or end stage renal disease: Secondary | ICD-10-CM | POA: Diagnosis not present

## 2022-08-10 DIAGNOSIS — Z794 Long term (current) use of insulin: Secondary | ICD-10-CM | POA: Diagnosis not present

## 2022-08-10 DIAGNOSIS — Z800 Family history of malignant neoplasm of digestive organs: Secondary | ICD-10-CM | POA: Diagnosis not present

## 2022-08-10 DIAGNOSIS — Z79899 Other long term (current) drug therapy: Secondary | ICD-10-CM | POA: Diagnosis not present

## 2022-08-10 DIAGNOSIS — E1022 Type 1 diabetes mellitus with diabetic chronic kidney disease: Secondary | ICD-10-CM | POA: Diagnosis not present

## 2022-08-10 DIAGNOSIS — Z823 Family history of stroke: Secondary | ICD-10-CM | POA: Diagnosis not present

## 2022-08-10 DIAGNOSIS — Z9641 Presence of insulin pump (external) (internal): Secondary | ICD-10-CM | POA: Diagnosis not present

## 2022-08-11 DIAGNOSIS — Z992 Dependence on renal dialysis: Secondary | ICD-10-CM | POA: Diagnosis not present

## 2022-08-11 DIAGNOSIS — N186 End stage renal disease: Secondary | ICD-10-CM | POA: Diagnosis not present

## 2022-08-11 DIAGNOSIS — E8779 Other fluid overload: Secondary | ICD-10-CM | POA: Diagnosis not present

## 2022-08-13 ENCOUNTER — Other Ambulatory Visit: Payer: Self-pay

## 2022-08-13 DIAGNOSIS — E8779 Other fluid overload: Secondary | ICD-10-CM | POA: Diagnosis not present

## 2022-08-13 DIAGNOSIS — N186 End stage renal disease: Secondary | ICD-10-CM | POA: Diagnosis not present

## 2022-08-13 DIAGNOSIS — Z992 Dependence on renal dialysis: Secondary | ICD-10-CM | POA: Diagnosis not present

## 2022-08-16 DIAGNOSIS — E8779 Other fluid overload: Secondary | ICD-10-CM | POA: Diagnosis not present

## 2022-08-16 DIAGNOSIS — N186 End stage renal disease: Secondary | ICD-10-CM | POA: Diagnosis not present

## 2022-08-16 DIAGNOSIS — Z992 Dependence on renal dialysis: Secondary | ICD-10-CM | POA: Diagnosis not present

## 2022-08-17 ENCOUNTER — Other Ambulatory Visit: Payer: Self-pay

## 2022-08-17 ENCOUNTER — Ambulatory Visit (INDEPENDENT_AMBULATORY_CARE_PROVIDER_SITE_OTHER): Payer: 59

## 2022-08-17 DIAGNOSIS — Z992 Dependence on renal dialysis: Secondary | ICD-10-CM | POA: Diagnosis not present

## 2022-08-17 DIAGNOSIS — E8779 Other fluid overload: Secondary | ICD-10-CM | POA: Diagnosis not present

## 2022-08-17 DIAGNOSIS — Z9889 Other specified postprocedural states: Secondary | ICD-10-CM | POA: Diagnosis not present

## 2022-08-17 DIAGNOSIS — N186 End stage renal disease: Secondary | ICD-10-CM | POA: Diagnosis not present

## 2022-08-18 DIAGNOSIS — Z992 Dependence on renal dialysis: Secondary | ICD-10-CM | POA: Diagnosis not present

## 2022-08-18 DIAGNOSIS — N186 End stage renal disease: Secondary | ICD-10-CM | POA: Diagnosis not present

## 2022-08-20 DIAGNOSIS — Z992 Dependence on renal dialysis: Secondary | ICD-10-CM | POA: Diagnosis not present

## 2022-08-20 DIAGNOSIS — N186 End stage renal disease: Secondary | ICD-10-CM | POA: Diagnosis not present

## 2022-08-22 DIAGNOSIS — N186 End stage renal disease: Secondary | ICD-10-CM | POA: Diagnosis not present

## 2022-08-22 DIAGNOSIS — Z992 Dependence on renal dialysis: Secondary | ICD-10-CM | POA: Diagnosis not present

## 2022-08-24 ENCOUNTER — Encounter (INDEPENDENT_AMBULATORY_CARE_PROVIDER_SITE_OTHER): Payer: Self-pay | Admitting: Nurse Practitioner

## 2022-08-24 ENCOUNTER — Ambulatory Visit (INDEPENDENT_AMBULATORY_CARE_PROVIDER_SITE_OTHER): Payer: 59 | Admitting: Nurse Practitioner

## 2022-08-24 VITALS — BP 182/89 | HR 61 | Resp 19 | Ht 71.0 in | Wt 225.0 lb

## 2022-08-24 DIAGNOSIS — I10 Essential (primary) hypertension: Secondary | ICD-10-CM

## 2022-08-24 DIAGNOSIS — Z992 Dependence on renal dialysis: Secondary | ICD-10-CM | POA: Diagnosis not present

## 2022-08-24 DIAGNOSIS — N186 End stage renal disease: Secondary | ICD-10-CM | POA: Diagnosis not present

## 2022-08-25 DIAGNOSIS — R635 Abnormal weight gain: Secondary | ICD-10-CM | POA: Diagnosis not present

## 2022-08-25 DIAGNOSIS — E8770 Fluid overload, unspecified: Secondary | ICD-10-CM | POA: Diagnosis not present

## 2022-08-25 DIAGNOSIS — E8779 Other fluid overload: Secondary | ICD-10-CM | POA: Diagnosis not present

## 2022-08-25 DIAGNOSIS — N186 End stage renal disease: Secondary | ICD-10-CM | POA: Diagnosis not present

## 2022-08-25 DIAGNOSIS — Z992 Dependence on renal dialysis: Secondary | ICD-10-CM | POA: Diagnosis not present

## 2022-08-27 DIAGNOSIS — E8779 Other fluid overload: Secondary | ICD-10-CM | POA: Diagnosis not present

## 2022-08-27 DIAGNOSIS — N186 End stage renal disease: Secondary | ICD-10-CM | POA: Diagnosis not present

## 2022-08-27 DIAGNOSIS — Z992 Dependence on renal dialysis: Secondary | ICD-10-CM | POA: Diagnosis not present

## 2022-08-27 DIAGNOSIS — R635 Abnormal weight gain: Secondary | ICD-10-CM | POA: Diagnosis not present

## 2022-08-27 DIAGNOSIS — E8770 Fluid overload, unspecified: Secondary | ICD-10-CM | POA: Diagnosis not present

## 2022-08-28 ENCOUNTER — Encounter (INDEPENDENT_AMBULATORY_CARE_PROVIDER_SITE_OTHER): Payer: Self-pay | Admitting: Nurse Practitioner

## 2022-08-28 DIAGNOSIS — E8770 Fluid overload, unspecified: Secondary | ICD-10-CM | POA: Diagnosis not present

## 2022-08-28 DIAGNOSIS — N186 End stage renal disease: Secondary | ICD-10-CM | POA: Diagnosis not present

## 2022-08-28 DIAGNOSIS — E8779 Other fluid overload: Secondary | ICD-10-CM | POA: Diagnosis not present

## 2022-08-28 DIAGNOSIS — R635 Abnormal weight gain: Secondary | ICD-10-CM | POA: Diagnosis not present

## 2022-08-28 DIAGNOSIS — Z992 Dependence on renal dialysis: Secondary | ICD-10-CM | POA: Diagnosis not present

## 2022-08-28 NOTE — H&P (View-Only) (Signed)
Subjective:    Patient ID: [name], male    DOB: 1971-05-17, 51 y.o.   MRN: [id number] No chief complaint on file.   HPI  Review of Systems     Objective:   Physical Exam  BP (!) 182/89 (BP Location: Right Arm)   Pulse 61   Resp 19   Ht _0  (1.803 m)   Wt 225 lb (102.1 kg)   BMI 31.38 kg/m   Past Medical History:  Diagnosis Date   Cellulitis    DKA (diabetic ketoacidosis) (HCC)    Esophageal dysphagia    ESRD (end stage renal disease) (HCC)    Gastroparesis    Herniated cervical disc    HTN (hypertension)    Hypercholesteremia    Hypothyroidism    Morbid obesity (HCC)    OSA (obstructive sleep apnea)    Peritoneal dialysis status (HCC)    PONV (postoperative nausea and vomiting)    after peritoneal dialysis catheter was placed   Presence of insulin pump    S/P cardiac cath    a. 10-15 yrs ago at [facility] due to tachycardia, reportedly normal. b. Normal ETT 03/2012.   Sepsis ([place])    Sinus tachycardia    a. 24-hr Holter 03/2012 - SR, occ PVCs, no VT, avg HR 96bpm.   Substance abuse (HCC)    TIA (transient ischemic attack)    Type 1 diabetes mellitus ([place])    a. With insulin pump.    Social History   Socioeconomic History   Marital status: Married    Spouse name: Not on file   Number of children: Not on file   Years of education: Not on file   Highest education level: Not on file  Occupational History   Not on file  Tobacco Use   Smoking status: Never   Smokeless tobacco: Never  Vaping Use   Vaping Use: Never used  Substance and Sexual Activity   Alcohol use: No   Drug use: No   Sexual activity: Yes  Other Topics Concern   Not on file  Social History Narrative   Not on file   Social Determinants of Health   Financial Resource Strain: Not on file  Food Insecurity: Not on file  Transportation Needs: Not on file  Physical Activity: Not on file  Stress: Not on file  Social Connections: Not on file  Intimate Partner Violence: Not on  file    Past Surgical History:  Procedure Laterality Date   AV FISTULA PLACEMENT Left 06/30/2022   Procedure: ARTERIOVENOUS (AV) FISTULA CREATION (BRACHIALCEPHALIC);  Surgeon: Katha Cabal, MD;  Location: [facility];  Service: Vascular;  Laterality: Left;   BIOPSY  10/03/2019   Procedure: BIOPSY;  Surgeon: Mauri Pole, MD;  Location: [facility];  Service: Endoscopy;;   CATARACT EXTRACTION Bilateral    cervical neck fusion     COLONOSCOPY WITH PROPOFOL N/A 01/02/2021   Procedure: COLONOSCOPY WITH PROPOFOL;  Surgeon: [name], MD;  Location: [place];  Service: Endoscopy;  Laterality: N/A;  priority 4 DIABETIC needs potassium draw   ESOPHAGOGASTRODUODENOSCOPY (EGD) WITH PROPOFOL N/A 10/03/2019   Procedure: ESOPHAGOGASTRODUODENOSCOPY (EGD) WITH PROPOFOL;  Surgeon: Mauri Pole, MD;  Location: [facility];  Service: Endoscopy;  Laterality: N/A;   EYE SURGERY Bilateral    for floaters   HERNIA REPAIR     bilateral   LEFT HEART CATH AND CORONARY ANGIOGRAPHY Left 10/16/2021   Procedure: LEFT HEART CATH AND CORONARY ANGIOGRAPHY;  Surgeon:  Wellington Hampshire, MD;  Location: [facility];  Service: Cardiovascular;  Laterality: Left;   LEFT HEART CATHETERIZATION WITH CORONARY ANGIOGRAM N/A 12/18/2013   Procedure: LEFT HEART CATHETERIZATION WITH CORONARY ANGIOGRAM;  Surgeon: [name] M Martinique, MD;  Location[facility];  Service: Cardiovascular;  Laterality: N/A;   PERITONEAL CATHETER INSERTION     PERITONEAL CATHETER REMOVAL     POLYPECTOMY  01/02/2021   Procedure: POLYPECTOMY;  Surgeon: [name], MD;  Location: [facility];  Service: Endoscopy;;    Family History  Problem Relation Age of Onset   Hypertension Other    Coronary artery disease Other        Mother's side - both her side's grandparents died of heart disease (MIs)   Diabetes Other    Stroke Other        Paternal grandfather (82)   Prostate cancer Neg Hx    Bladder Cancer Neg  Hx    Kidney cancer Neg Hx     Allergies  Allergen Reactions   Sulfa Antibiotics Anaphylaxis and Swelling    Pt is not allergic to iodine, has had iodine in the past w/o premeds and w/ no problems   Oxycontin [Oxycodone Hcl] Nausea And Vomiting   Penicillins Other (See Comments)    Tolerated 1st generation cephalosporin (CEFAZOLIN) on 05/29/2020 without documented ADRs.   Possible reaction many years ago per patient.  Has had PCN without issues since time of "reaction".    Prednisone Other (See Comments)    Patient is diabetic, runs sugar up     Shellfish-Derived Products Swelling    Eyes swell with shellfish Pt is not allergic to iodine, has had iodine in the past w/o premeds and w/ no problem   Pregabalin Other (See Comments)    Muscle twitching and jerks       Latest Ref Rng & Units 06/30/2022   11:32 AM 10/13/2021   12:18 PM 01/22/2021    5:03 AM  CBC  WBC 3.4 - 10.8 x10E3/uL  6.0    Hemoglobin 13.0 - 17.0 g/dL 12.2  10.6  16.0   Hematocrit 39.0 - 52.0 % 36.0  32.3  47.0   Platelets 150 - 450 x10E3/uL  272        CMP     Component Value Date/Time   NA 132 (L) 06/30/2022 1132   NA 136 10/13/2021 1218   K 4.8 06/30/2022 1132   CL 98 06/30/2022 1132   CO2 24 06/28/2022 0834   GLUCOSE 255 (H) 06/30/2022 1132   BUN 46 (H) 06/30/2022 1132   BUN 99 (HH) 10/13/2021 1218   CREATININE 7.30 (H) 06/30/2022 1132   CALCIUM 8.9 06/28/2022 0834   PROT 6.8 01/22/2021 0442   PROT 5.8 (L) 10/10/2019 1037   ALBUMIN 3.3 (L) 01/22/2021 0442   ALBUMIN 3.3 (L) 10/10/2019 1037   AST 47 (H) 01/22/2021 0442   ALT 26 01/22/2021 0442   ALKPHOS 79 01/22/2021 0442   BILITOT 1.0 01/22/2021 0442   BILITOT 0.3 10/10/2019 1037   GFRNONAA 6 (L) 06/28/2022 0834   GFRAA 18 (L) 01/28/2020 0719     No results found.     Assessment & Plan:   1. Primary hypertension Continue antihypertensive medications as already ordered, these medications have been reviewed and there are no changes  at this time.  2. ESRD (end stage renal disease) ([place]) Recommend:  The patient is experiencing increasing problems with their dialysis access.  Patient should have a fistulagram  with the intention for intervention.  The intention for intervention is to restore appropriate flow and prevent thrombosis and possible loss of the access.  As well as improve the quality of dialysis therapy.  The risks, benefits and alternative therapies were reviewed in detail with the patient.  All questions were answered.  The patient agrees to proceed with angio/intervention.    The patient will follow up with me in the office after the procedure.    Current Outpatient Medications on File Prior to Visit  Medication Sig Dispense Refill   acetaminophen (TYLENOL) 500 MG tablet Take 500-1,000 mg by mouth every 6 (six) hours as needed for mild pain or fever.     ALPRAZolam (XANAX) 0.25 MG tablet Take 1 tablet (0.25 mg total) by mouth 2 (two) times daily as needed for Sleep for up to 60 days 60 tablet 1   amLODipine (NORVASC) 10 MG tablet TAKE 1 TABLET BY MOUTH ONCE DAILY (Patient taking differently: Take 10 mg by mouth every morning.) 30 tablet 11   atorvastatin (LIPITOR) 80 MG tablet TAKE 1 TABLET BY MOUTH ONCE DAILY (Patient taking differently: Take 80 mg by mouth daily before lunch.) 30 tablet 11   carvedilol (COREG) 25 MG tablet Take one tablet by mouth 2 times daily with meals 360 tablet 0   cinacalcet (SENSIPAR) 60 MG tablet Take 1 tablet (60 mg total) by mouth daily. 90 tablet 3   cinacalcet (SENSIPAR) 60 MG tablet Take 2 tablets (120 mg total) by mouth daily. 60 tablet 12   cloNIDine (CATAPRES) 0.1 MG tablet Take 0.2 mg by mouth 2 (two) times daily.     cloNIDine (CATAPRES) 0.2 MG tablet Take 1 tablet (0.2 mg total) by mouth 2 (two) times daily. 60 tablet 11   Continuous Blood Gluc Sensor (DEXCOM G6 SENSOR) MISC Change every 10 days 9 each 2   Continuous Blood Gluc Sensor (FREESTYLE LIBRE 3 SENSOR) MISC  Apply 1 sensor on upper arm every 14 days for continuous glucose monitoring 2 each 2   Continuous Blood Gluc Transmit (DEXCOM G6 TRANSMITTER) MISC Change every 3 months 1 each 2   Glucagon, rDNA, (GLUCAGON EMERGENCY) 1 MG KIT Inject into the muscle as directed for severe sugar. 2 kit 0   HYDROcodone-acetaminophen (NORCO) 5-325 MG tablet Take 1-2 tablets by mouth every 6 (six) hours as needed for moderate pain or severe pain. 30 tablet 0   insulin glargine-yfgn (SEMGLEE) 100 UNIT/ML Pen Inject 5 Units into the skin nightly. 15 mL 0   insulin lispro (HUMALOG) 100 UNIT/ML injection Inject 3 Units into the skin 3 (three) times daily before meals.     Insulin Pen Needle (UNIFINE PENTIPS) 31G X 5 MM MISC Inject 1 Syringe into the skin 4 times daily. Use to inject insulin as prescribed. 100 each 3   losartan (COZAAR) 100 MG tablet TAKE 1 TABLET BY MOUTH ONCE DAILY 30 tablet 11   minoxidil (LONITEN) 2.5 MG tablet Take 1 Tablet Oral as directed (Patient taking differently: Take 2.5 mg by mouth every evening.) 30 tablet 11   ondansetron (ZOFRAN) 4 MG tablet Take 1 tablet by mouth every 8 hours as needed for nausea or vomiting. 20 tablet 0   pantoprazole (PROTONIX) 40 MG tablet Take 1 tablet (40 mg total) by mouth once daily (Patient taking differently: Take 40 mg by mouth every morning.) 30 tablet 11   sucralfate (CARAFATE) 1 g tablet Take 1 tablet (1 g total) by mouth 4 (four) times daily before meals  and nightly 120 tablet 11   sucroferric oxyhydroxide (VELPHORO) 500 MG chewable tablet Chew 2 tablets by mouth 3 times a day with food 180 tablet 12   traZODone (DESYREL) 50 MG tablet Take 1 tablet (50 mg total) by mouth at bedtime. 30 tablet 1   losartan (COZAAR) 100 MG tablet TAKE 1 TABLET BY MOUTH ONCE DAILY (Patient not taking: Reported on 10/08/2021) 30 tablet 11   torsemide (DEMADEX) 100 MG tablet Take 1 tablet by mouth once a day (Patient not taking: Reported on 06/23/2022) 90 tablet 3   [DISCONTINUED]  promethazine (PHENERGAN) 25 MG tablet Take 1/2 (12.35m) to 1 (242m tab as needed for nausea and vomiting not controlled with Zofran up to every 6 hours. 30 tablet 0   No current facility-administered medications on file prior to visit.    There are no Patient Instructions on file for this visit. No follow-ups on file.   FaKris HartmannNP

## 2022-08-28 NOTE — Progress Notes (Signed)
Subjective:    Patient ID: [name], male    DOB: 1971-05-17, 51 y.o.   MRN: [id number] No chief complaint on file.   HPI  Review of Systems     Objective:   Physical Exam  BP (!) 182/89 (BP Location: Right Arm)   Pulse 61   Resp 19   Ht _0  (1.803 m)   Wt 225 lb (102.1 kg)   BMI 31.38 kg/m   Past Medical History:  Diagnosis Date   Cellulitis    DKA (diabetic ketoacidosis) (HCC)    Esophageal dysphagia    ESRD (end stage renal disease) (HCC)    Gastroparesis    Herniated cervical disc    HTN (hypertension)    Hypercholesteremia    Hypothyroidism    Morbid obesity (HCC)    OSA (obstructive sleep apnea)    Peritoneal dialysis status (HCC)    PONV (postoperative nausea and vomiting)    after peritoneal dialysis catheter was placed   Presence of insulin pump    S/P cardiac cath    a. 10-15 yrs ago at [facility] due to tachycardia, reportedly normal. b. Normal ETT 03/2012.   Sepsis ([place])    Sinus tachycardia    a. 24-hr Holter 03/2012 - SR, occ PVCs, no VT, avg HR 96bpm.   Substance abuse (HCC)    TIA (transient ischemic attack)    Type 1 diabetes mellitus ([place])    a. With insulin pump.    Social History   Socioeconomic History   Marital status: Married    Spouse name: Not on file   Number of children: Not on file   Years of education: Not on file   Highest education level: Not on file  Occupational History   Not on file  Tobacco Use   Smoking status: Never   Smokeless tobacco: Never  Vaping Use   Vaping Use: Never used  Substance and Sexual Activity   Alcohol use: No   Drug use: No   Sexual activity: Yes  Other Topics Concern   Not on file  Social History Narrative   Not on file   Social Determinants of Health   Financial Resource Strain: Not on file  Food Insecurity: Not on file  Transportation Needs: Not on file  Physical Activity: Not on file  Stress: Not on file  Social Connections: Not on file  Intimate Partner Violence: Not on  file    Past Surgical History:  Procedure Laterality Date   AV FISTULA PLACEMENT Left 06/30/2022   Procedure: ARTERIOVENOUS (AV) FISTULA CREATION (BRACHIALCEPHALIC);  Surgeon: Katha Cabal, MD;  Location: [facility];  Service: Vascular;  Laterality: Left;   BIOPSY  10/03/2019   Procedure: BIOPSY;  Surgeon: Mauri Pole, MD;  Location: [facility];  Service: Endoscopy;;   CATARACT EXTRACTION Bilateral    cervical neck fusion     COLONOSCOPY WITH PROPOFOL N/A 01/02/2021   Procedure: COLONOSCOPY WITH PROPOFOL;  Surgeon: [name], MD;  Location: [place];  Service: Endoscopy;  Laterality: N/A;  priority 4 DIABETIC needs potassium draw   ESOPHAGOGASTRODUODENOSCOPY (EGD) WITH PROPOFOL N/A 10/03/2019   Procedure: ESOPHAGOGASTRODUODENOSCOPY (EGD) WITH PROPOFOL;  Surgeon: Mauri Pole, MD;  Location: [facility];  Service: Endoscopy;  Laterality: N/A;   EYE SURGERY Bilateral    for floaters   HERNIA REPAIR     bilateral   LEFT HEART CATH AND CORONARY ANGIOGRAPHY Left 10/16/2021   Procedure: LEFT HEART CATH AND CORONARY ANGIOGRAPHY;  Surgeon:  Arida, Muhammad A, MD;  Location: [facility];  Service: Cardiovascular;  Laterality: Left;   LEFT HEART CATHETERIZATION WITH CORONARY ANGIOGRAM N/A 12/18/2013   Procedure: LEFT HEART CATHETERIZATION WITH CORONARY ANGIOGRAM;  Surgeon: [name] M Jordan, MD;  Location: [facility];  Service: Cardiovascular;  Laterality: N/A;   PERITONEAL CATHETER INSERTION     PERITONEAL CATHETER REMOVAL     POLYPECTOMY  01/02/2021   Procedure: POLYPECTOMY;  Surgeon: Wohl, Darren, MD;  Location: [facility];  Service: Endoscopy;;    Family History  Problem Relation Age of Onset   Hypertension Other    Coronary artery disease Other        Mother's side - both her side's grandparents died of heart disease (MIs)   Diabetes Other    Stroke Other        Paternal grandfather (82)   Prostate cancer Neg Hx    Bladder Cancer Neg  Hx    Kidney cancer Neg Hx     Allergies  Allergen Reactions   Sulfa Antibiotics Anaphylaxis and Swelling    Pt is not allergic to iodine, has had iodine in the past w/o premeds and w/ no problems   Oxycontin [Oxycodone Hcl] Nausea And Vomiting   Penicillins Other (See Comments)    Tolerated 1st generation cephalosporin (CEFAZOLIN) on 05/29/2020 without documented ADRs.   Possible reaction many years ago per patient.  Has had PCN without issues since time of "reaction".    Prednisone Other (See Comments)    Patient is diabetic, runs sugar up     Shellfish-Derived Products Swelling    Eyes swell with shellfish Pt is not allergic to iodine, has had iodine in the past w/o premeds and w/ no problem   Pregabalin Other (See Comments)    Muscle twitching and jerks       Latest Ref Rng & Units 06/30/2022   11:32 AM 10/13/2021   12:18 PM 01/22/2021    5:03 AM  CBC  WBC 3.4 - 10.8 x10E3/uL  6.0    Hemoglobin 13.0 - 17.0 g/dL 12.2  10.6  16.0   Hematocrit 39.0 - 52.0 % 36.0  32.3  47.0   Platelets 150 - 450 x10E3/uL  272        CMP     Component Value Date/Time   NA 132 (L) 06/30/2022 1132   NA 136 10/13/2021 1218   K 4.8 06/30/2022 1132   CL 98 06/30/2022 1132   CO2 24 06/28/2022 0834   GLUCOSE 255 (H) 06/30/2022 1132   BUN 46 (H) 06/30/2022 1132   BUN 99 (HH) 10/13/2021 1218   CREATININE 7.30 (H) 06/30/2022 1132   CALCIUM 8.9 06/28/2022 0834   PROT 6.8 01/22/2021 0442   PROT 5.8 (L) 10/10/2019 1037   ALBUMIN 3.3 (L) 01/22/2021 0442   ALBUMIN 3.3 (L) 10/10/2019 1037   AST 47 (H) 01/22/2021 0442   ALT 26 01/22/2021 0442   ALKPHOS 79 01/22/2021 0442   BILITOT 1.0 01/22/2021 0442   BILITOT 0.3 10/10/2019 1037   GFRNONAA 6 (L) 06/28/2022 0834   GFRAA 18 (L) 01/28/2020 0719     No results found.     Assessment & Plan:   1. Primary hypertension Continue antihypertensive medications as already ordered, these medications have been reviewed and there are no changes  at this time.  2. ESRD (end stage renal disease) (HCC) Recommend:  The patient is experiencing increasing problems with their dialysis access.  Patient should have a fistulagram   with the intention for intervention.  The intention for intervention is to restore appropriate flow and prevent thrombosis and possible loss of the access.  As well as improve the quality of dialysis therapy.  The risks, benefits and alternative therapies were reviewed in detail with the patient.  All questions were answered.  The patient agrees to proceed with angio/intervention.    The patient will follow up with me in the office after the procedure.    Current Outpatient Medications on File Prior to Visit  Medication Sig Dispense Refill   acetaminophen (TYLENOL) 500 MG tablet Take 500-1,000 mg by mouth every 6 (six) hours as needed for mild pain or fever.     ALPRAZolam (XANAX) 0.25 MG tablet Take 1 tablet (0.25 mg total) by mouth 2 (two) times daily as needed for Sleep for up to 60 days 60 tablet 1   amLODipine (NORVASC) 10 MG tablet TAKE 1 TABLET BY MOUTH ONCE DAILY (Patient taking differently: Take 10 mg by mouth every morning.) 30 tablet 11   atorvastatin (LIPITOR) 80 MG tablet TAKE 1 TABLET BY MOUTH ONCE DAILY (Patient taking differently: Take 80 mg by mouth daily before lunch.) 30 tablet 11   carvedilol (COREG) 25 MG tablet Take one tablet by mouth 2 times daily with meals 360 tablet 0   cinacalcet (SENSIPAR) 60 MG tablet Take 1 tablet (60 mg total) by mouth daily. 90 tablet 3   cinacalcet (SENSIPAR) 60 MG tablet Take 2 tablets (120 mg total) by mouth daily. 60 tablet 12   cloNIDine (CATAPRES) 0.1 MG tablet Take 0.2 mg by mouth 2 (two) times daily.     cloNIDine (CATAPRES) 0.2 MG tablet Take 1 tablet (0.2 mg total) by mouth 2 (two) times daily. 60 tablet 11   Continuous Blood Gluc Sensor (DEXCOM G6 SENSOR) MISC Change every 10 days 9 each 2   Continuous Blood Gluc Sensor (FREESTYLE LIBRE 3 SENSOR) MISC  Apply 1 sensor on upper arm every 14 days for continuous glucose monitoring 2 each 2   Continuous Blood Gluc Transmit (DEXCOM G6 TRANSMITTER) MISC Change every 3 months 1 each 2   Glucagon, rDNA, (GLUCAGON EMERGENCY) 1 MG KIT Inject into the muscle as directed for severe sugar. 2 kit 0   HYDROcodone-acetaminophen (NORCO) 5-325 MG tablet Take 1-2 tablets by mouth every 6 (six) hours as needed for moderate pain or severe pain. 30 tablet 0   insulin glargine-yfgn (SEMGLEE) 100 UNIT/ML Pen Inject 5 Units into the skin nightly. 15 mL 0   insulin lispro (HUMALOG) 100 UNIT/ML injection Inject 3 Units into the skin 3 (three) times daily before meals.     Insulin Pen Needle (UNIFINE PENTIPS) 31G X 5 MM MISC Inject 1 Syringe into the skin 4 times daily. Use to inject insulin as prescribed. 100 each 3   losartan (COZAAR) 100 MG tablet TAKE 1 TABLET BY MOUTH ONCE DAILY 30 tablet 11   minoxidil (LONITEN) 2.5 MG tablet Take 1 Tablet Oral as directed (Patient taking differently: Take 2.5 mg by mouth every evening.) 30 tablet 11   ondansetron (ZOFRAN) 4 MG tablet Take 1 tablet by mouth every 8 hours as needed for nausea or vomiting. 20 tablet 0   pantoprazole (PROTONIX) 40 MG tablet Take 1 tablet (40 mg total) by mouth once daily (Patient taking differently: Take 40 mg by mouth every morning.) 30 tablet 11   sucralfate (CARAFATE) 1 g tablet Take 1 tablet (1 g total) by mouth 4 (four) times daily before meals  and nightly 120 tablet 11   sucroferric oxyhydroxide (VELPHORO) 500 MG chewable tablet Chew 2 tablets by mouth 3 times a day with food 180 tablet 12   traZODone (DESYREL) 50 MG tablet Take 1 tablet (50 mg total) by mouth at bedtime. 30 tablet 1   losartan (COZAAR) 100 MG tablet TAKE 1 TABLET BY MOUTH ONCE DAILY (Patient not taking: Reported on 10/08/2021) 30 tablet 11   torsemide (DEMADEX) 100 MG tablet Take 1 tablet by mouth once a day (Patient not taking: Reported on 06/23/2022) 90 tablet 3   [DISCONTINUED]  promethazine (PHENERGAN) 25 MG tablet Take 1/2 (12.35m) to 1 (242m tab as needed for nausea and vomiting not controlled with Zofran up to every 6 hours. 30 tablet 0   No current facility-administered medications on file prior to visit.    There are no Patient Instructions on file for this visit. No follow-ups on file.   FaKris HartmannNP

## 2022-08-30 DIAGNOSIS — N186 End stage renal disease: Secondary | ICD-10-CM | POA: Diagnosis not present

## 2022-08-30 DIAGNOSIS — R635 Abnormal weight gain: Secondary | ICD-10-CM | POA: Diagnosis not present

## 2022-08-30 DIAGNOSIS — E8779 Other fluid overload: Secondary | ICD-10-CM | POA: Diagnosis not present

## 2022-08-30 DIAGNOSIS — E8770 Fluid overload, unspecified: Secondary | ICD-10-CM | POA: Diagnosis not present

## 2022-08-30 DIAGNOSIS — Z992 Dependence on renal dialysis: Secondary | ICD-10-CM | POA: Diagnosis not present

## 2022-09-01 ENCOUNTER — Telehealth (INDEPENDENT_AMBULATORY_CARE_PROVIDER_SITE_OTHER): Payer: Self-pay

## 2022-09-01 DIAGNOSIS — E8779 Other fluid overload: Secondary | ICD-10-CM | POA: Diagnosis not present

## 2022-09-01 DIAGNOSIS — R635 Abnormal weight gain: Secondary | ICD-10-CM | POA: Diagnosis not present

## 2022-09-01 DIAGNOSIS — E8770 Fluid overload, unspecified: Secondary | ICD-10-CM | POA: Diagnosis not present

## 2022-09-01 DIAGNOSIS — N186 End stage renal disease: Secondary | ICD-10-CM | POA: Diagnosis not present

## 2022-09-01 DIAGNOSIS — Z992 Dependence on renal dialysis: Secondary | ICD-10-CM | POA: Diagnosis not present

## 2022-09-01 NOTE — Telephone Encounter (Signed)
Spoke with the patient and he is scheduled with Dr. Delana Meyer for a LUE fistulagram on 09/14/22 with a  9:00 am [facility]. Pre-procedure instructions were discussed and will be mailed.

## 2022-09-02 DIAGNOSIS — N186 End stage renal disease: Secondary | ICD-10-CM | POA: Diagnosis not present

## 2022-09-02 DIAGNOSIS — Z992 Dependence on renal dialysis: Secondary | ICD-10-CM | POA: Diagnosis not present

## 2022-09-02 NOTE — Telephone Encounter (Signed)
Patient's arrival time for his procedure on 09/14/22 with Dr. Delana Meyer for a LUE fistulagram has changed from 900 am to 1:00 pm. Patient was informed and understood.

## 2022-09-03 DIAGNOSIS — Z992 Dependence on renal dialysis: Secondary | ICD-10-CM | POA: Diagnosis not present

## 2022-09-03 DIAGNOSIS — N186 End stage renal disease: Secondary | ICD-10-CM | POA: Diagnosis not present

## 2022-09-06 DIAGNOSIS — Z992 Dependence on renal dialysis: Secondary | ICD-10-CM | POA: Diagnosis not present

## 2022-09-06 DIAGNOSIS — N186 End stage renal disease: Secondary | ICD-10-CM | POA: Diagnosis not present

## 2022-09-07 DIAGNOSIS — Z7682 Awaiting organ transplant status: Secondary | ICD-10-CM | POA: Diagnosis not present

## 2022-09-08 DIAGNOSIS — Z992 Dependence on renal dialysis: Secondary | ICD-10-CM | POA: Diagnosis not present

## 2022-09-08 DIAGNOSIS — N186 End stage renal disease: Secondary | ICD-10-CM | POA: Diagnosis not present

## 2022-09-09 ENCOUNTER — Other Ambulatory Visit: Payer: Self-pay

## 2022-09-09 DIAGNOSIS — E1065 Type 1 diabetes mellitus with hyperglycemia: Secondary | ICD-10-CM | POA: Diagnosis not present

## 2022-09-09 DIAGNOSIS — E103293 Type 1 diabetes mellitus with mild nonproliferative diabetic retinopathy without macular edema, bilateral: Secondary | ICD-10-CM | POA: Diagnosis not present

## 2022-09-09 DIAGNOSIS — E1042 Type 1 diabetes mellitus with diabetic polyneuropathy: Secondary | ICD-10-CM | POA: Diagnosis not present

## 2022-09-09 DIAGNOSIS — N186 End stage renal disease: Secondary | ICD-10-CM | POA: Diagnosis not present

## 2022-09-09 DIAGNOSIS — E1022 Type 1 diabetes mellitus with diabetic chronic kidney disease: Secondary | ICD-10-CM | POA: Diagnosis not present

## 2022-09-09 MED ORDER — INSULIN LISPRO (1 UNIT DIAL) 100 UNIT/ML (KWIKPEN)
50.0000 [IU] | PEN_INJECTOR | Freq: Every day | SUBCUTANEOUS | 3 refills | Status: DC
  Filled 2022-09-09: qty 45, 90d supply, fill #0

## 2022-09-09 MED ORDER — INSULIN PEN NEEDLE 31G X 6 MM MISC: 3 refills | Status: DC

## 2022-09-09 MED ORDER — DEXCOM G6 SENSOR MISC
3 refills | Status: DC
  Filled 2022-09-09: qty 9, 90d supply, fill #0

## 2022-09-09 MED ORDER — DEXCOM G6 TRANSMITTER MISC
3 refills | Status: DC
  Filled 2022-09-09: qty 1, 90d supply, fill #0

## 2022-09-10 ENCOUNTER — Other Ambulatory Visit: Payer: Self-pay

## 2022-09-10 DIAGNOSIS — N186 End stage renal disease: Secondary | ICD-10-CM | POA: Diagnosis not present

## 2022-09-10 DIAGNOSIS — Z992 Dependence on renal dialysis: Secondary | ICD-10-CM | POA: Diagnosis not present

## 2022-09-10 DIAGNOSIS — E8779 Other fluid overload: Secondary | ICD-10-CM | POA: Diagnosis not present

## 2022-09-11 DIAGNOSIS — N186 End stage renal disease: Secondary | ICD-10-CM | POA: Diagnosis not present

## 2022-09-11 DIAGNOSIS — Z992 Dependence on renal dialysis: Secondary | ICD-10-CM | POA: Diagnosis not present

## 2022-09-11 DIAGNOSIS — E8779 Other fluid overload: Secondary | ICD-10-CM | POA: Diagnosis not present

## 2022-09-13 DIAGNOSIS — N186 End stage renal disease: Secondary | ICD-10-CM | POA: Diagnosis not present

## 2022-09-13 DIAGNOSIS — E8779 Other fluid overload: Secondary | ICD-10-CM | POA: Diagnosis not present

## 2022-09-13 DIAGNOSIS — Z992 Dependence on renal dialysis: Secondary | ICD-10-CM | POA: Diagnosis not present

## 2022-09-14 ENCOUNTER — Encounter: Payer: Self-pay | Admitting: Vascular Surgery

## 2022-09-14 ENCOUNTER — Ambulatory Visit
Admission: RE | Admit: 2022-09-14 | Discharge: 2022-09-14 | Disposition: A | Discharge status: Home / Self Care | Payer: 59 | Attending: Vascular Surgery | Admitting: Vascular Surgery

## 2022-09-14 ENCOUNTER — Encounter
Admission: RE | Disposition: A | Discharge status: Home / Self Care | Payer: Self-pay | Source: Home / Self Care | Attending: Vascular Surgery

## 2022-09-14 DIAGNOSIS — Z794 Long term (current) use of insulin: Secondary | ICD-10-CM | POA: Insufficient documentation

## 2022-09-14 DIAGNOSIS — E1022 Type 1 diabetes mellitus with diabetic chronic kidney disease: Secondary | ICD-10-CM | POA: Insufficient documentation

## 2022-09-14 DIAGNOSIS — Z992 Dependence on renal dialysis: Secondary | ICD-10-CM | POA: Diagnosis not present

## 2022-09-14 DIAGNOSIS — T82858A Stenosis of vascular prosthetic devices, implants and grafts, initial encounter: Secondary | ICD-10-CM | POA: Diagnosis not present

## 2022-09-14 DIAGNOSIS — T82898A Other specified complication of vascular prosthetic devices, implants and grafts, initial encounter: Secondary | ICD-10-CM | POA: Diagnosis not present

## 2022-09-14 DIAGNOSIS — Y841 Kidney dialysis as the cause of abnormal reaction of the patient, or of later complication, without mention of misadventure at the time of the procedure: Secondary | ICD-10-CM | POA: Diagnosis not present

## 2022-09-14 DIAGNOSIS — I120 Hypertensive chronic kidney disease with stage 5 chronic kidney disease or end stage renal disease: Secondary | ICD-10-CM | POA: Insufficient documentation

## 2022-09-14 DIAGNOSIS — N186 End stage renal disease: Secondary | ICD-10-CM | POA: Diagnosis not present

## 2022-09-14 HISTORY — PX: A/V FISTULAGRAM: CATH118298

## 2022-09-14 LAB — GLUCOSE, CAPILLARY
Glucose-Capillary: 76 mg/dL (ref 70–99)
Glucose-Capillary: 88 mg/dL (ref 70–99)

## 2022-09-14 LAB — POTASSIUM (ARMC VASCULAR LAB ONLY): Potassium (ARMC vascular lab): 5.5 mmol/L — ABNORMAL HIGH (ref 3.5–5.1)

## 2022-09-14 SURGERY — A/V FISTULAGRAM
Anesthesia: Moderate Sedation | Laterality: Left

## 2022-09-14 MED ORDER — HEPARIN SODIUM (PORCINE) 1000 UNIT/ML IJ SOLN
INTRAMUSCULAR | Status: DC | PRN
  Administered 2022-09-14: 4000 [IU] via INTRAVENOUS

## 2022-09-14 MED ORDER — STERILE WATER FOR INJECTION IJ SOLN
INTRAMUSCULAR | Status: AC
  Filled 2022-09-14: qty 10

## 2022-09-14 MED ORDER — FENTANYL CITRATE (PF) 100 MCG/2ML IJ SOLN
INTRAMUSCULAR | Status: DC | PRN
  Administered 2022-09-14: 50 ug via INTRAVENOUS

## 2022-09-14 MED ORDER — DIPHENHYDRAMINE HCL 50 MG/ML IJ SOLN
INTRAMUSCULAR | Status: AC
  Administered 2022-09-14: 50 mg via INTRAVENOUS
  Filled 2022-09-14: qty 1

## 2022-09-14 MED ORDER — FAMOTIDINE 20 MG PO TABS
ORAL_TABLET | ORAL | Status: AC
  Administered 2022-09-14: 40 mg via ORAL
  Filled 2022-09-14: qty 2

## 2022-09-14 MED ORDER — METHYLPREDNISOLONE SODIUM SUCC 125 MG IJ SOLR
INTRAMUSCULAR | Status: AC
  Administered 2022-09-14: 125 mg via INTRAVENOUS
  Filled 2022-09-14: qty 2

## 2022-09-14 MED ORDER — MIDAZOLAM HCL 2 MG/2ML IJ SOLN
INTRAMUSCULAR | Status: AC
  Filled 2022-09-14: qty 4

## 2022-09-14 MED ORDER — ONDANSETRON HCL 4 MG/2ML IJ SOLN: 4.0000 mg | Freq: Four times a day (QID) | INTRAMUSCULAR | Status: DC | PRN

## 2022-09-14 MED ORDER — FENTANYL CITRATE PF 50 MCG/ML IJ SOSY
PREFILLED_SYRINGE | INTRAMUSCULAR | Status: AC
  Filled 2022-09-14: qty 2

## 2022-09-14 MED ORDER — METHYLPREDNISOLONE SODIUM SUCC 125 MG IJ SOLR: 125.0000 mg | Freq: Once | INTRAMUSCULAR | Status: AC | PRN

## 2022-09-14 MED ORDER — VANCOMYCIN HCL IN DEXTROSE 1-5 GM/200ML-% IV SOLN: 1000.0000 mg | INTRAVENOUS | Status: AC

## 2022-09-14 MED ORDER — DIPHENHYDRAMINE HCL 50 MG/ML IJ SOLN: 50.0000 mg | Freq: Once | INTRAMUSCULAR | Status: AC | PRN

## 2022-09-14 MED ORDER — HYDROMORPHONE HCL 1 MG/ML IJ SOLN: 1.0000 mg | Freq: Once | INTRAMUSCULAR | Status: DC | PRN

## 2022-09-14 MED ORDER — MIDAZOLAM HCL 2 MG/ML PO SYRP: 8.0000 mg | ORAL_SOLUTION | Freq: Once | ORAL | Status: DC | PRN

## 2022-09-14 MED ORDER — HEPARIN SODIUM (PORCINE) 1000 UNIT/ML IJ SOLN
INTRAMUSCULAR | Status: AC
  Filled 2022-09-14: qty 10

## 2022-09-14 MED ORDER — MIDAZOLAM HCL 2 MG/2ML IJ SOLN
INTRAMUSCULAR | Status: DC | PRN
  Administered 2022-09-14: 2 mg via INTRAVENOUS

## 2022-09-14 MED ORDER — FAMOTIDINE 20 MG PO TABS: 40.0000 mg | ORAL_TABLET | Freq: Once | ORAL | Status: AC | PRN

## 2022-09-14 MED ORDER — IODIXANOL 320 MG/ML IV SOLN
INTRAVENOUS | Status: DC | PRN
  Administered 2022-09-14: 25 mL via INTRAVENOUS

## 2022-09-14 MED ORDER — SODIUM CHLORIDE 0.9 % IV SOLN
INTRAVENOUS | Status: DC
  Administered 2022-09-14: 1000 mL via INTRAVENOUS

## 2022-09-14 MED ORDER — VANCOMYCIN HCL IN DEXTROSE 1-5 GM/200ML-% IV SOLN
INTRAVENOUS | Status: AC
  Administered 2022-09-14: 1000 mg via INTRAVENOUS
  Filled 2022-09-14: qty 200

## 2022-09-14 SURGICAL SUPPLY — 16 items
BALLN DORADO 5X40X80 (BALLOONS) ×1
BALLN LUTONIX DCB 6X40X130 (BALLOONS) ×1
BALLOON DORADO 5X40X80 (BALLOONS) IMPLANT
BALLOON LUTONIX DCB 6X40X130 (BALLOONS) IMPLANT
COVER PROBE ULTRASOUND 5X96 (MISCELLANEOUS) IMPLANT
DRAPE BRACHIAL (DRAPES) IMPLANT
GOWN STRL REUS W/ TWL LRG LVL3 (GOWN DISPOSABLE) ×1 IMPLANT
GOWN STRL REUS W/TWL LRG LVL3 (GOWN DISPOSABLE) ×1
KIT ENCORE 26 ADVANTAGE (KITS) IMPLANT
NDL ENTRY 21GA 7CM ECHOTIP (NEEDLE) IMPLANT
NEEDLE ENTRY 21GA 7CM ECHOTIP (NEEDLE) ×1 IMPLANT
PACK ANGIOGRAPHY (CUSTOM PROCEDURE TRAY) ×1 IMPLANT
SET INTRO CAPELLA COAXIAL (SET/KITS/TRAYS/PACK) IMPLANT
SHEATH BRITE TIP 6FRX5.5 (SHEATH) IMPLANT
SUT MNCRL AB 4-0 PS2 18 (SUTURE) IMPLANT
WIRE SUPRACORE 190CM (MISCELLANEOUS) IMPLANT

## 2022-09-14 NOTE — Interval H&P Note (Signed)
History and Physical Interval Note:  09/14/2022 11:46 AM  [name]  has presented today for surgery, with the diagnosis of LUE Fistulagram    End Stage Renal.  The various methods of treatment have been discussed with the patient and family. After consideration of risks, benefits and other options for treatment, the patient has consented to  Procedure(s): A/V Fistulagram (Left) as a surgical intervention.  The patient's history has been reviewed, patient examined, no change in status, stable for surgery.  I have reviewed the patient's chart and labs.  Questions were answered to the patient's satisfaction.     Hortencia Pilar

## 2022-09-14 NOTE — Op Note (Signed)
OPERATIVE NOTE   PROCEDURE: Contrast injection left AV access Percutaneous transluminal angioplasty left brachiocephalic to 6 mm.   PRE-OPERATIVE DIAGNOSIS: Complication of dialysis access                                                       End Stage Renal Disease  POST-OPERATIVE DIAGNOSIS: same as above   SURGEON: Katha Cabal, M.D.  ANESTHESIA: Conscious sedation was administered under my direct supervision by the interventional radiology RN. IV Versed plus fentanyl were utilized. Continuous ECG, pulse oximetry and blood pressure was monitored throughout the entire procedure.  Conscious sedation was for a total of 22 minutes.  ESTIMATED BLOOD LOSS: minimal  FINDING(S): Stricture of the AV graft  SPECIMEN(S):  None  CONTRAST: 25 cc  FLUOROSCOPY TIME: 1.8 minutes  INDICATIONS: [name] is a 51 y.o. male who  presents with malfunctioning left arm brachiocephalic AV access.  The patient is scheduled for angiography with possible intervention of the AV access.  The patient is aware the risks include but are not limited to: bleeding, infection, thrombosis of the cannulated access, and possible anaphylactic reaction to the contrast.  The patient acknowledges if the access can not be salvaged a tunneled catheter will be needed and will be placed during this procedure.  The patient is aware of the risks of the procedure and elects to proceed with the angiogram and intervention.  DESCRIPTION: After full informed written consent was obtained, the patient was brought back to the Special Procedure suite and placed supine position.  Appropriate cardiopulmonary monitors were placed.  The left arm was prepped and draped in the standard fashion.  Appropriate timeout is called. The left brachiocephalic fistula was cannulated with a micropuncture needle.  Cannulation was performed with ultrasound guidance. Ultrasound was placed in a sterile sleeve, the AV access was interrogated and  noted to be echolucent and compressible indicating patency. Image was recorded for the permanent record. The puncture is performed under continuous ultrasound visualization.   The microwire was advanced and the needle was exchanged for  a microsheath.  The J-wire was then advanced and a 6 Fr sheath inserted.  Hand injections were completed to image the access from the arterial anastomosis through the entire access.  The central venous structures were also imaged by hand injections.  Based on the images, 4000 units of heparin was given and a wire was negotiated through the strictures within the venous portion of the graft.  A 5 mm x 40 mm Dorado balloon was used.  Inflation was to 12 atm for 1 minute.  Next, a 6 mm x 40 mm Lutonix drug-eluting balloon was used.  At this point the balloon is markedly undersized but the patient was experiencing significant pain.  Follow-up imaging demonstrates improvement with approximately 20% residual stenosis with rapid flow of contrast through the graft, the central venous anatomy is preserved.  A 4-0 Monocryl purse-string suture was sewn around the sheath.  The sheath was removed and light pressure was applied.  A sterile bandage was applied to the puncture site.  Given the appearance of this lesion I believe I have provided a short-term solution however with a long-term perspective I would recommend an interposition graft as I believe this stenotic segment will continue to be a problem in the future  COMPLICATIONS: None  CONDITION: [name], M.D [facility] Office: [phone]  09/14/2022 12:30 PM

## 2022-09-15 ENCOUNTER — Encounter: Payer: Self-pay | Admitting: Vascular Surgery

## 2022-09-15 ENCOUNTER — Telehealth (INDEPENDENT_AMBULATORY_CARE_PROVIDER_SITE_OTHER): Payer: Self-pay

## 2022-09-15 DIAGNOSIS — E8779 Other fluid overload: Secondary | ICD-10-CM | POA: Diagnosis not present

## 2022-09-15 DIAGNOSIS — N186 End stage renal disease: Secondary | ICD-10-CM | POA: Diagnosis not present

## 2022-09-15 DIAGNOSIS — Z992 Dependence on renal dialysis: Secondary | ICD-10-CM | POA: Diagnosis not present

## 2022-09-15 LAB — GLUCOSE, CAPILLARY: Glucose-Capillary: 187 mg/dL — ABNORMAL HIGH (ref 70–99)

## 2022-09-15 NOTE — Telephone Encounter (Signed)
Spoke with the patient and he is scheduled with Dr. Delana Meyer on 09/24/22 for a revision of left brachial cephalic fistula with Artegraft at the [facility]. Pre-op phone call is on 09/20/22 between 8-1 pm. Pre-surgical instructions were discussed and will be mailed.

## 2022-09-17 DIAGNOSIS — N186 End stage renal disease: Secondary | ICD-10-CM | POA: Diagnosis not present

## 2022-09-17 DIAGNOSIS — Z992 Dependence on renal dialysis: Secondary | ICD-10-CM | POA: Diagnosis not present

## 2022-09-17 DIAGNOSIS — E8779 Other fluid overload: Secondary | ICD-10-CM | POA: Diagnosis not present

## 2022-09-18 DIAGNOSIS — E8779 Other fluid overload: Secondary | ICD-10-CM | POA: Diagnosis not present

## 2022-09-18 DIAGNOSIS — N186 End stage renal disease: Secondary | ICD-10-CM | POA: Diagnosis not present

## 2022-09-18 DIAGNOSIS — Z992 Dependence on renal dialysis: Secondary | ICD-10-CM | POA: Diagnosis not present

## 2022-09-19 NOTE — H&P (Signed)
Subjective:     Patient ID: [name], male    DOB: [birth year], 51 y.o.   MRN: [id number] No chief complaint on file.     HPI   Review of Systems      Objective:   Physical Exam   BP (!) 182/89 (BP Location: Right Arm)   Pulse 61   Resp 19   Ht _0  (1.803 m)   Wt 225 lb (102.1 kg)   BMI 31.38 kg/m        Past Medical History:  Diagnosis Date   Cellulitis     DKA (diabetic ketoacidosis) (HCC)     Esophageal dysphagia     ESRD (end stage renal disease) (HCC)     Gastroparesis     Herniated cervical disc     HTN (hypertension)     Hypercholesteremia     Hypothyroidism     Morbid obesity (HCC)     OSA (obstructive sleep apnea)     Peritoneal dialysis status (HCC)     PONV (postoperative nausea and vomiting)      after peritoneal dialysis catheter was placed   Presence of insulin pump     S/P cardiac cath      a. 10-15 yrs ago at [facility] due to tachycardia, reportedly normal. b. Normal ETT 03/2012.   Sepsis ([place])     Sinus tachycardia      a. 24-hr Holter 03/2012 - SR, occ PVCs, no VT, avg HR 96bpm.   Substance abuse (HCC)     TIA (transient ischemic attack)     Type 1 diabetes mellitus ([place])      a. With insulin pump.      Social History         Socioeconomic History   Marital status: Married      Spouse name: Not on file   Number of children: Not on file   Years of education: Not on file   Highest education level: Not on file  Occupational History   Not on file  Tobacco Use   Smoking status: Never   Smokeless tobacco: Never  Vaping Use   Vaping Use: Never used  Substance and Sexual Activity   Alcohol use: No   Drug use: No   Sexual activity: Yes  Other Topics Concern   Not on file  Social History Narrative   Not on file    Social Determinants of Health    Financial Resource Strain: Not on file  Food Insecurity: Not on file  Transportation Needs: Not on file  Physical Activity: Not on file  Stress: Not on file  Social  Connections: Not on file  Intimate Partner Violence: Not on file           Past Surgical History:  Procedure Laterality Date   AV FISTULA PLACEMENT Left 06/30/2022    Procedure: ARTERIOVENOUS (AV) FISTULA CREATION (BRACHIALCEPHALIC);  Surgeon: Katha Cabal, MD;  Location: [facility];  Service: Vascular;  Laterality: Left;   BIOPSY   10/03/2019    Procedure: BIOPSY;  Surgeon: Mauri Pole, MD;  Location: [facility];  Service: Endoscopy;;   CATARACT EXTRACTION Bilateral     cervical neck fusion       COLONOSCOPY WITH PROPOFOL N/A 01/02/2021    Procedure: COLONOSCOPY WITH PROPOFOL;  Surgeon: [name], MD;  Location: [facility];  Service: Endoscopy;  Laterality: N/A;  priority 4 DIABETIC needs potassium draw   ESOPHAGOGASTRODUODENOSCOPY (EGD) WITH PROPOFOL N/A 10/03/2019  Procedure: ESOPHAGOGASTRODUODENOSCOPY (EGD) WITH PROPOFOL;  Surgeon: Mauri Pole, MD;  Location: [facility];  Service: Endoscopy;  Laterality: N/A;   EYE SURGERY Bilateral      for floaters   HERNIA REPAIR        bilateral   LEFT HEART CATH AND CORONARY ANGIOGRAPHY Left 10/16/2021    Procedure: LEFT HEART CATH AND CORONARY ANGIOGRAPHY;  Surgeon: Wellington Hampshire, MD;  Location: [place] [facility];  Service: Cardiovascular;  Laterality: Left;   LEFT HEART CATHETERIZATION WITH CORONARY ANGIOGRAM N/A 12/18/2013    Procedure: LEFT HEART CATHETERIZATION WITH CORONARY ANGIOGRAM;  Surgeon: [name] M Martinique, MD;  Location: [facility];  Service: Cardiovascular;  Laterality: N/A;   PERITONEAL CATHETER INSERTION       PERITONEAL CATHETER REMOVAL       POLYPECTOMY   01/02/2021    Procedure: POLYPECTOMY;  Surgeon: [name], MD;  Location: [facility];  Service: Endoscopy;;           Family History  Problem Relation Age of Onset   Hypertension Other     Coronary artery disease Other          Mother's side - both her side's grandparents died of heart disease (MIs)   Diabetes  Other     Stroke Other          Paternal grandfather (82)   Prostate cancer Neg Hx     Bladder Cancer Neg Hx     Kidney cancer Neg Hx             Allergies  Allergen Reactions   Sulfa Antibiotics Anaphylaxis and Swelling      Pt is not allergic to iodine, has had iodine in the past w/o premeds and w/ no problems   Oxycontin [Oxycodone Hcl] Nausea And Vomiting   Penicillins Other (See Comments)      Tolerated 1st generation cephalosporin (CEFAZOLIN) on 05/29/2020 without documented ADRs.    Possible reaction many years ago per patient.  Has had PCN without issues since time of "reaction".     Prednisone Other (See Comments)      Patient is diabetic, runs sugar up       Shellfish-Derived Products Swelling      Eyes swell with shellfish Pt is not allergic to iodine, has had iodine in the past w/o premeds and w/ no problem   Pregabalin Other (See Comments)      Muscle twitching and jerks          Latest Ref Rng & Units 06/30/2022   11:32 AM 10/13/2021   12:18 PM 01/22/2021    5:03 AM  CBC  WBC 3.4 - 10.8 x10E3/uL   6.0     Hemoglobin 13.0 - 17.0 g/dL 12.2  10.6  16.0   Hematocrit 39.0 - 52.0 % 36.0  32.3  47.0   Platelets 150 - 450 x10E3/uL   272             CMP     Labs (Brief)          Component Value Date/Time    NA 132 (L) 06/30/2022 1132    NA 136 10/13/2021 1218    K 4.8 06/30/2022 1132    CL 98 06/30/2022 1132    CO2 24 06/28/2022 0834    GLUCOSE 255 (H) 06/30/2022 1132    BUN 46 (H) 06/30/2022 1132    BUN 99 (HH) 10/13/2021 1218    CREATININE 7.30 (H) 06/30/2022 1132  CALCIUM 8.9 06/28/2022 0834    PROT 6.8 01/22/2021 0442    PROT 5.8 (L) 10/10/2019 1037    ALBUMIN 3.3 (L) 01/22/2021 0442    ALBUMIN 3.3 (L) 10/10/2019 1037    AST 47 (H) 01/22/2021 0442    ALT 26 01/22/2021 0442    ALKPHOS 79 01/22/2021 0442    BILITOT 1.0 01/22/2021 0442    BILITOT 0.3 10/10/2019 1037    GFRNONAA 6 (L) 06/28/2022 0834    GFRAA 18 (L) 01/28/2020 0719           No results found.      Assessment & Plan:    1. Primary hypertension Continue antihypertensive medications as already ordered, these medications have been reviewed and there are no changes at this time.   2. ESRD (end stage renal disease) ([place]) Recommend:   The patient is experiencing increasing problems with their dialysis access.   Patient should have a fistulagram with the intention for intervention.  The intention for intervention is to restore appropriate flow and prevent thrombosis and possible loss of the access.  As well as improve the quality of dialysis therapy.   The risks, benefits and alternative therapies were reviewed in detail with the patient.  All questions were answered.  The patient agrees to proceed with angio/intervention.     The patient will follow up with me in the office after the procedure.             Current Outpatient Medications on File Prior to Visit  Medication Sig Dispense Refill   acetaminophen (TYLENOL) 500 MG tablet Take 500-1,000 mg by mouth every 6 (six) hours as needed for mild pain or fever.       ALPRAZolam (XANAX) 0.25 MG tablet Take 1 tablet (0.25 mg total) by mouth 2 (two) times daily as needed for Sleep for up to 60 days 60 tablet 1   amLODipine (NORVASC) 10 MG tablet TAKE 1 TABLET BY MOUTH ONCE DAILY (Patient taking differently: Take 10 mg by mouth every morning.) 30 tablet 11   atorvastatin (LIPITOR) 80 MG tablet TAKE 1 TABLET BY MOUTH ONCE DAILY (Patient taking differently: Take 80 mg by mouth daily before lunch.) 30 tablet 11   carvedilol (COREG) 25 MG tablet Take one tablet by mouth 2 times daily with meals 360 tablet 0   cinacalcet (SENSIPAR) 60 MG tablet Take 1 tablet (60 mg total) by mouth daily. 90 tablet 3   cinacalcet (SENSIPAR) 60 MG tablet Take 2 tablets (120 mg total) by mouth daily. 60 tablet 12   cloNIDine (CATAPRES) 0.1 MG tablet Take 0.2 mg by mouth 2 (two) times daily.       cloNIDine (CATAPRES) 0.2 MG tablet Take 1  tablet (0.2 mg total) by mouth 2 (two) times daily. 60 tablet 11   Continuous Blood Gluc Sensor (DEXCOM G6 SENSOR) MISC Change every 10 days 9 each 2   Continuous Blood Gluc Sensor (FREESTYLE LIBRE 3 SENSOR) MISC Apply 1 sensor on upper arm every 14 days for continuous glucose monitoring 2 each 2   Continuous Blood Gluc Transmit (DEXCOM G6 TRANSMITTER) MISC Change every 3 months 1 each 2   Glucagon, rDNA, (GLUCAGON EMERGENCY) 1 MG KIT Inject into the muscle as directed for severe sugar. 2 kit 0   HYDROcodone-acetaminophen (NORCO) 5-325 MG tablet Take 1-2 tablets by mouth every 6 (six) hours as needed for moderate pain or severe pain. 30 tablet 0   insulin glargine-yfgn (SEMGLEE) 100 UNIT/ML Pen Inject 5 Units  into the skin nightly. 15 mL 0   insulin lispro (HUMALOG) 100 UNIT/ML injection Inject 3 Units into the skin 3 (three) times daily before meals.       Insulin Pen Needle (UNIFINE PENTIPS) 31G X 5 MM MISC Inject 1 Syringe into the skin 4 times daily. Use to inject insulin as prescribed. 100 each 3   losartan (COZAAR) 100 MG tablet TAKE 1 TABLET BY MOUTH ONCE DAILY 30 tablet 11   minoxidil (LONITEN) 2.5 MG tablet Take 1 Tablet Oral as directed (Patient taking differently: Take 2.5 mg by mouth every evening.) 30 tablet 11   ondansetron (ZOFRAN) 4 MG tablet Take 1 tablet by mouth every 8 hours as needed for nausea or vomiting. 20 tablet 0   pantoprazole (PROTONIX) 40 MG tablet Take 1 tablet (40 mg total) by mouth once daily (Patient taking differently: Take 40 mg by mouth every morning.) 30 tablet 11   sucralfate (CARAFATE) 1 g tablet Take 1 tablet (1 g total) by mouth 4 (four) times daily before meals and nightly 120 tablet 11   sucroferric oxyhydroxide (VELPHORO) 500 MG chewable tablet Chew 2 tablets by mouth 3 times a day with food 180 tablet 12   traZODone (DESYREL) 50 MG tablet Take 1 tablet (50 mg total) by mouth at bedtime. 30 tablet 1   losartan (COZAAR) 100 MG tablet TAKE 1 TABLET BY MOUTH  ONCE DAILY (Patient not taking: Reported on 10/08/2021) 30 tablet 11   torsemide (DEMADEX) 100 MG tablet Take 1 tablet by mouth once a day (Patient not taking: Reported on 06/23/2022) 90 tablet 3   [DISCONTINUED] promethazine (PHENERGAN) 25 MG tablet Take 1/2 (12.30m) to 1 (240m tab as needed for nausea and vomiting not controlled with Zofran up to every 6 hours. 30 tablet 0    No current facility-administered medications on file prior to visit.      There are no Patient Instructions on file for this visit. No follow-ups on file.     FaKris HartmannNP            Electronically signed by BrKris HartmannNP at 08/28/2022  1:24 AM

## 2022-09-20 ENCOUNTER — Encounter
Admission: RE | Admit: 2022-09-20 | Discharge: 2022-09-20 | Disposition: A | Discharge status: Home / Self Care | Payer: 59 | Source: Ambulatory Visit | Attending: Vascular Surgery | Admitting: Vascular Surgery

## 2022-09-20 ENCOUNTER — Other Ambulatory Visit (INDEPENDENT_AMBULATORY_CARE_PROVIDER_SITE_OTHER): Payer: Self-pay | Admitting: Nurse Practitioner

## 2022-09-20 ENCOUNTER — Encounter: Payer: Self-pay | Admitting: Vascular Surgery

## 2022-09-20 DIAGNOSIS — N186 End stage renal disease: Secondary | ICD-10-CM

## 2022-09-20 DIAGNOSIS — E875 Hyperkalemia: Secondary | ICD-10-CM

## 2022-09-20 DIAGNOSIS — E1040 Type 1 diabetes mellitus with diabetic neuropathy, unspecified: Secondary | ICD-10-CM

## 2022-09-20 DIAGNOSIS — Z01812 Encounter for preprocedural laboratory examination: Secondary | ICD-10-CM

## 2022-09-20 DIAGNOSIS — Z01818 Encounter for other preprocedural examination: Secondary | ICD-10-CM

## 2022-09-20 HISTORY — DX: Dependence on renal dialysis: Z99.2

## 2022-09-20 NOTE — Progress Notes (Signed)
Perioperative Services Pre-Admission/Anesthesia Testing   Date: 09/20/22 Name: [name] MRN:   [id number]  Re: Consideration of preoperative prophylactic antibiotic change   Request sent to: Schnier, Dolores Lory, MD and Eulogio Ditch, NP-C (routed and/or faxed via [facility])  Planned Surgical Procedure(s):    Case: [id number] Date/Time: 09/24/22 1009   Procedure: REVISON OF ARTERIOVENOUS FISTULA (BRACHIAL CEPHALIC WITH ARTEGRAFT) (Left)   Anesthesia type: General   Pre-op diagnosis: ESRD   Location: [place] [facility] / [facility]   Surgeons: Katha Cabal, MD   Clinical Notes:  Patient has a documented allergy/intolerance to PCN  The actual reaction to PCN is unknown. Reports having had PCN without issues.   EMR review indicated that patient received PCN and/or cephalosporin in the past as follows: CEFAZOLIN received on 05/29/2020 & 06/30/2022 with no documented ADRs.   Screened as appropriate for cephalosporin use during medication reconciliation No immediate angioedema, dysphagia, SOB, anaphylaxis symptoms. No severe rash involving mucous membranes or skin necrosis. No hospital admissions related to side effects of PCN/cephalosporin use.  No documented reaction to PCN or cephalosporin in the last 10 years.  Request:  As an evidence based approach to reducing the rate of incidence for post-operative SSI and the development of MDROs, could an agent that allows for narrower antimicrobial coverage for preoperative prophylaxis in this patient's upcoming surgical course be considered?   Currently ordered preoperative prophylactic ABX: vancomycin.   Specifically requesting change to cephalosporin (CEFAZOLIN).  Drug of choice for many procedures; it is the most widely studied antimicrobial agent with proven efficacy for antimicrobial prophylaxis.   Desirable duration of action, spectrum of activity against organisms commonly encountered in surgery, and it has  an excellent safety profile and low cost.   Active against streptococci, methicillin-susceptible staphylococci, and many gram-negative organisms.  Please communicate decision with me and I will change the orders in Epic as per your direction.   Things to consider: Many patients report that they were "allergic" to PCN earlier in life, however this does not translate into a true lifelong allergy. Patients can lose sensitivity to specific IgE antibodies over time if PCN is avoided (Kleris & Lugar, 2019).  Up to 10% of the adult population and 15% of hospitalized patients report an allergy to PCN, however clinical studies suggest that 90% of those reporting an allergy can tolerate PCN antibiotics (Kleris & Lugar, 2019).  Cross-sensitivity between PCN and cephalosporins has been documented as being as high as 10%, however this estimation included data believed to have been collected in a setting where there was contamination. Newer data suggests that the prevalence of cross-sensitivity between PCN and cephalosporins is actually estimated to be closer to 1% (Hermanides et al., 2018).   Patients labeled as PCN allergic, whether they are truly allergic or not, have been found to have inferior outcomes in terms of rates of serious infection, and these patients tend to have longer hospital stays (Grover, 2019).  Treatment related secondary infections, such as Clostridioides difficile, have been linked to the improper use of broad spectrum antibiotics in patients improperly labeled as PCN allergic (Kleris & Lugar, 2019).  Anaphylaxis from cephalosporins is rare and the evidence suggests that there is no increased risk of an anaphylactic type reaction when cephalosporins are used in a PCN allergic patient (Pichichero, 2006).  Citations: Hermanides J, Lemkes BA, Prins Pearla Dubonnet MW, Terreehorst I. Presumed ?-Lactam Allergy and Cross-reactivity in the Operating Theater: A Practical Approach.  Anesthesiology.  2018 Aug;129(2):335-342. doi: 10.1097/ALN.0000000000002252. PMID: 43606770.  Kleris, Obetz., & Lugar, P. L. (2019). Things We Do For No Reason: Failing to Question a Penicillin Allergy History. Journal of hospital medicine, 14(10), [phone]. Advance online publication. https://www.wallace-middleton.info/  Pichichero, M. E. (2006). Cephalosporins can be prescribed safely for penicillin-allergic patients. Journal of family medicine, 55(2), 106-112. Accessed: https://cdn.mdedge.com/files/s14f-public/Document/September-2017/5502JFP_AppliedEvidence1.pdf   BHonor Loh MSN, APRN, FNP-C, CEN [facility] Peri-operative Services Nurse Practitioner FAX: [phone]/18/23 11:53 AM

## 2022-09-20 NOTE — Patient Instructions (Signed)
Your procedure is scheduled on:09-24-22 Friday Report to the Registration Desk on the 1st floor of the [facility]. To find out your arrival time, please call [phone] between 1PM - 3PM on:09-23-22 Thursday If your arrival time is 6:00 am, do not arrive prior to that time as the [facility] entrance doors do not open until 6:00 am.  REMEMBER: Instructions that are not followed completely may result in serious medical risk, up to and including death; or upon the discretion of your surgeon and anesthesiologist your surgery may need to be rescheduled.  Do not eat food OR drink any liquids after midnight the night before surgery.  No gum chewing, lozengers or hard candies.  TAKE THESE MEDICATIONS THE MORNING OF SURGERY WITH A SIP OF WATER: -amLODipine (NORVASC)  -atorvastatin (LIPITOR)  -carvedilol (COREG)  -cloNIDine (CATAPRES)  -minoxidil (LONITEN)  -pantoprazole (PROTONIX) -take one the night before and one on the morning of surgery - helps to prevent nausea after surgery.)  Take half of your Semglee Insulin (2.5 units) the night before your surgery and NO Insulin the morning of surgery  One week prior to surgery: Stop Anti-inflammatories (NSAIDS) such as Advil, Aleve, Ibuprofen, Motrin, Naproxen, Naprosyn and Aspirin based products such as Excedrin, Goodys Powder, BC Powder.You may however, continue to take Tylenol/Hydrocodone if needed for pain up until the day of surgery. Stop ANY OVER THE COUNTER supplements/vitamins until after surgery.  No Alcohol for 24 hours before or after surgery.  No Smoking including e-cigarettes for 24 hours prior to surgery.  No chewable tobacco products for at least 6 hours prior to surgery.  No nicotine patches on the day of surgery.  Do not use any "recreational" drugs for at least a week prior to your surgery.  Please be advised that the combination of cocaine and anesthesia may have negative outcomes, up to and including death. If you test  positive for cocaine, your surgery will be cancelled.  On the morning of surgery brush your teeth with toothpaste and water, you may rinse your mouth with mouthwash if you wish. Do not swallow any toothpaste or mouthwash.  Use CHG Soap as directed on instruction sheet.  Do not wear jewelry, make-up, hairpins, clips or nail polish.  Do not wear lotions, powders, or perfumes.   Do not shave body from the neck down 48 hours prior to surgery just in case you cut yourself which could leave a site for infection.  Also, freshly shaved skin may become irritated if using the CHG soap.  Contact lenses, hearing aids and dentures may not be worn into surgery.  Do not bring valuables to the hospital. [facility] is not responsible for any missing/lost belongings or valuables.  Bring your C-PAP to the hospital with you in case you may have to spend the night.   Notify your doctor if there is any change in your medical condition (cold, fever, infection).  Wear comfortable clothing (specific to your surgery type) to the hospital.  After surgery, you can help prevent lung complications by doing breathing exercises.  Take deep breaths and cough every 1-2 hours. Your doctor may order a device called an Incentive Spirometer to help you take deep breaths. When coughing or sneezing, hold a pillow firmly against your incision with both hands. This is called "splinting." Doing this helps protect your incision. It also decreases belly discomfort.  If you are being admitted to the hospital overnight, leave your suitcase in the car. After surgery it may be brought  to your room.  If you are being discharged the day of surgery, you will not be allowed to drive home. You will need a responsible adult (18 years or older) to drive you home and stay with you that night.   If you are taking public transportation, you will need to have a responsible adult (18 years or older) with you. Please confirm with your  physician that it is acceptable to use public transportation.   Please call the [facility] Dept. at [phone] if you have any questions about these instructions.  Surgery Visitation Policy:  Patients undergoing a surgery or procedure may have two family members or support persons with them as long as the person is not COVID-19 positive or experiencing its symptoms.   Due to an increase in RSV and influenza rates and associated hospitalizations, children ages 58 and under will not be able to visit patients in [facility]. Masks continue to be strongly recommended.

## 2022-09-21 DIAGNOSIS — Z992 Dependence on renal dialysis: Secondary | ICD-10-CM | POA: Diagnosis not present

## 2022-09-21 DIAGNOSIS — N186 End stage renal disease: Secondary | ICD-10-CM | POA: Diagnosis not present

## 2022-09-21 DIAGNOSIS — E8779 Other fluid overload: Secondary | ICD-10-CM | POA: Diagnosis not present

## 2022-09-22 DIAGNOSIS — N186 End stage renal disease: Secondary | ICD-10-CM | POA: Diagnosis not present

## 2022-09-22 DIAGNOSIS — E8779 Other fluid overload: Secondary | ICD-10-CM | POA: Diagnosis not present

## 2022-09-22 DIAGNOSIS — Z992 Dependence on renal dialysis: Secondary | ICD-10-CM | POA: Diagnosis not present

## 2022-09-23 ENCOUNTER — Encounter
Admission: RE | Admit: 2022-09-23 | Discharge: 2022-09-23 | Disposition: A | Discharge status: Home / Self Care | Payer: 59 | Source: Ambulatory Visit | Attending: Vascular Surgery | Admitting: Vascular Surgery

## 2022-09-23 DIAGNOSIS — Z01812 Encounter for preprocedural laboratory examination: Secondary | ICD-10-CM | POA: Diagnosis not present

## 2022-09-23 DIAGNOSIS — E1040 Type 1 diabetes mellitus with diabetic neuropathy, unspecified: Secondary | ICD-10-CM | POA: Diagnosis not present

## 2022-09-23 DIAGNOSIS — N186 End stage renal disease: Secondary | ICD-10-CM | POA: Diagnosis not present

## 2022-09-23 DIAGNOSIS — E875 Hyperkalemia: Secondary | ICD-10-CM | POA: Diagnosis not present

## 2022-09-23 LAB — BASIC METABOLIC PANEL
Anion gap: 18 — ABNORMAL HIGH (ref 5–15)
BUN: 76 mg/dL — ABNORMAL HIGH (ref 6–20)
CO2: 22 mmol/L (ref 22–32)
Calcium: 7.9 mg/dL — ABNORMAL LOW (ref 8.9–10.3)
Chloride: 91 mmol/L — ABNORMAL LOW (ref 98–111)
Creatinine, Ser: 7.83 mg/dL — ABNORMAL HIGH (ref 0.61–1.24)
GFR, Estimated: 8 mL/min — ABNORMAL LOW (ref >60)
Glucose, Bld: 344 mg/dL — ABNORMAL HIGH (ref 70–99)
Potassium: 5.5 mmol/L — ABNORMAL HIGH (ref 3.5–5.1)
Sodium: 131 mmol/L — ABNORMAL LOW (ref 135–145)

## 2022-09-23 LAB — TYPE AND SCREEN
ABO/RH(D): A POS
Antibody Screen: NEGATIVE

## 2022-09-23 MED ORDER — CHLORHEXIDINE GLUCONATE CLOTH 2 % EX PADS: 6.0000 | MEDICATED_PAD | Freq: Once | CUTANEOUS | Status: DC

## 2022-09-23 MED ORDER — SODIUM CHLORIDE 0.9 % IV SOLN: INTRAVENOUS | Status: DC

## 2022-09-23 MED ORDER — ORAL CARE MOUTH RINSE: 15.0000 mL | Freq: Once | OROMUCOSAL | Status: AC

## 2022-09-23 MED ORDER — CHLORHEXIDINE GLUCONATE 0.12 % MT SOLN: 15.0000 mL | Freq: Once | OROMUCOSAL | Status: AC

## 2022-09-23 MED ORDER — VANCOMYCIN HCL IN DEXTROSE 1-5 GM/200ML-% IV SOLN: 1000.0000 mg | INTRAVENOUS | Status: AC

## 2022-09-23 NOTE — Progress Notes (Signed)
  [facility] Perioperative Services: Pre-Admission/Anesthesia Testing  Abnormal Lab Notification   Date: 09/23/22  Name: [name] MRN:   [id number]  Re: Abnormal labs noted during PAT appointment   Notified:  Provider Name Provider Role Notification Mode  Schnier, Dolores Lory, MD Vascular Surgery (Surgeon) Routed and/or faxed via Jones Skene, NP-C Vascular Surgery (APP) Routed and/or faxed via [place] and Notes:  ABNORMAL LAB VALUE(S):  Hospital Outpatient Visit on 09/23/2022  Component Date Value Ref Range Status   Sodium 09/23/2022 131 (L)  135 - 145 mmol/L Final   Potassium 09/23/2022 5.5 (H)  3.5 - 5.1 mmol/L Final   Chloride 09/23/2022 91 (L)  98 - 111 mmol/L Final   CO2 09/23/2022 22  22 - 32 mmol/L Final   Glucose, Bld 09/23/2022 344 (H)  70 - 99 mg/dL Final   Glucose reference range applies only to samples taken after fasting for at least 8 hours.   BUN 09/23/2022 76 (H)  6 - 20 mg/dL Final   Creatinine, Ser 09/23/2022 7.83 (H)  0.61 - 1.24 mg/dL Final   Calcium 09/23/2022 7.9 (L)  8.9 - 10.3 mg/dL Final   GFR, Estimated 09/23/2022 8 (L)  >60 mL/min Final   Comment: (NOTE) Calculated using the CKD-EPI Creatinine Equation (2021)    Anion gap 09/23/2022 18 (H)  5 - 15 Final    [name] is scheduled for a REVISON OF ARTERIOVENOUS FISTULA (BRACHIOCEPHALIC WITH ARTEGRAFT)  on 09/24/2022. Patient is on hemodialysis with a Monday, Wednesday, and Friday schedule. Due to surgery this week, patient was dialyzed yesterday (09/23/2019), but is not scheduled to be dialyzed again until Saturday (09/25/2022).   Honor Loh, MSN, APRN, FNP-C, CEN [facility]  Peri-operative Services Nurse Practitioner Phone: [phone] Fax: [phone] 09/23/22 12:58 PM

## 2022-09-24 ENCOUNTER — Ambulatory Visit: Payer: 59 | Admitting: Urgent Care

## 2022-09-24 ENCOUNTER — Ambulatory Visit: Payer: 59

## 2022-09-24 ENCOUNTER — Ambulatory Visit
Admission: RE | Admit: 2022-09-24 | Discharge: 2022-09-24 | Disposition: A | Discharge status: Home / Self Care | Payer: 59 | Attending: Vascular Surgery | Admitting: Vascular Surgery

## 2022-09-24 ENCOUNTER — Other Ambulatory Visit: Payer: Self-pay

## 2022-09-24 ENCOUNTER — Encounter: Payer: Self-pay | Admitting: Vascular Surgery

## 2022-09-24 ENCOUNTER — Encounter
Admission: RE | Disposition: A | Discharge status: Home / Self Care | Payer: Self-pay | Source: Home / Self Care | Attending: Vascular Surgery

## 2022-09-24 DIAGNOSIS — E1122 Type 2 diabetes mellitus with diabetic chronic kidney disease: Secondary | ICD-10-CM | POA: Insufficient documentation

## 2022-09-24 DIAGNOSIS — Z794 Long term (current) use of insulin: Secondary | ICD-10-CM | POA: Insufficient documentation

## 2022-09-24 DIAGNOSIS — Z8249 Family history of ischemic heart disease and other diseases of the circulatory system: Secondary | ICD-10-CM | POA: Insufficient documentation

## 2022-09-24 DIAGNOSIS — Z8711 Personal history of peptic ulcer disease: Secondary | ICD-10-CM | POA: Diagnosis not present

## 2022-09-24 DIAGNOSIS — T82898A Other specified complication of vascular prosthetic devices, implants and grafts, initial encounter: Secondary | ICD-10-CM | POA: Diagnosis not present

## 2022-09-24 DIAGNOSIS — Z01818 Encounter for other preprocedural examination: Secondary | ICD-10-CM

## 2022-09-24 DIAGNOSIS — Z9641 Presence of insulin pump (external) (internal): Secondary | ICD-10-CM | POA: Insufficient documentation

## 2022-09-24 DIAGNOSIS — T82858A Stenosis of vascular prosthetic devices, implants and grafts, initial encounter: Secondary | ICD-10-CM | POA: Diagnosis not present

## 2022-09-24 DIAGNOSIS — Z992 Dependence on renal dialysis: Secondary | ICD-10-CM | POA: Diagnosis not present

## 2022-09-24 DIAGNOSIS — Y832 Surgical operation with anastomosis, bypass or graft as the cause of abnormal reaction of the patient, or of later complication, without mention of misadventure at the time of the procedure: Secondary | ICD-10-CM | POA: Insufficient documentation

## 2022-09-24 DIAGNOSIS — N186 End stage renal disease: Secondary | ICD-10-CM | POA: Insufficient documentation

## 2022-09-24 DIAGNOSIS — I120 Hypertensive chronic kidney disease with stage 5 chronic kidney disease or end stage renal disease: Secondary | ICD-10-CM | POA: Insufficient documentation

## 2022-09-24 DIAGNOSIS — I770 Arteriovenous fistula, acquired: Secondary | ICD-10-CM | POA: Diagnosis not present

## 2022-09-24 HISTORY — PX: REVISON OF ARTERIOVENOUS FISTULA: SHX6074

## 2022-09-24 LAB — POCT I-STAT, CHEM 8
BUN: 86 mg/dL — ABNORMAL HIGH (ref 6–20)
Calcium, Ion: 0.9 mmol/L — ABNORMAL LOW (ref 1.15–1.40)
Chloride: 96 mmol/L — ABNORMAL LOW (ref 98–111)
Creatinine, Ser: 11.1 mg/dL — ABNORMAL HIGH (ref 0.61–1.24)
Glucose, Bld: 144 mg/dL — ABNORMAL HIGH (ref 70–99)
HCT: 39 % (ref 39.0–52.0)
Hemoglobin: 13.3 g/dL (ref 13.0–17.0)
Potassium: 5.6 mmol/L — ABNORMAL HIGH (ref 3.5–5.1)
Sodium: 128 mmol/L — ABNORMAL LOW (ref 135–145)
TCO2: 24 mmol/L (ref 22–32)

## 2022-09-24 LAB — GLUCOSE, CAPILLARY: Glucose-Capillary: 269 mg/dL — ABNORMAL HIGH (ref 70–99)

## 2022-09-24 SURGERY — REVISON OF ARTERIOVENOUS FISTULA
Anesthesia: General | Laterality: Left

## 2022-09-24 MED ORDER — FENTANYL CITRATE PF 50 MCG/ML IJ SOSY
PREFILLED_SYRINGE | INTRAMUSCULAR | Status: AC
  Administered 2022-09-24: 50 ug via INTRAVENOUS
  Filled 2022-09-24: qty 1

## 2022-09-24 MED ORDER — BUPIVACAINE LIPOSOME 1.3 % IJ SUSP
INTRAMUSCULAR | Status: DC | PRN
  Administered 2022-09-24: 20 mL

## 2022-09-24 MED ORDER — MIDAZOLAM HCL 2 MG/2ML IJ SOLN
INTRAMUSCULAR | Status: AC
  Filled 2022-09-24: qty 2

## 2022-09-24 MED ORDER — PROPOFOL 500 MG/50ML IV EMUL
INTRAVENOUS | Status: DC | PRN
  Administered 2022-09-24 (×2): 30 mg via INTRAVENOUS
  Administered 2022-09-24: 100 ug/kg/min via INTRAVENOUS
  Administered 2022-09-24 (×2): 10 mg via INTRAVENOUS

## 2022-09-24 MED ORDER — FENTANYL CITRATE (PF) 100 MCG/2ML IJ SOLN: 25.0000 ug | INTRAMUSCULAR | Status: DC | PRN

## 2022-09-24 MED ORDER — BUPIVACAINE HCL (PF) 0.5 % IJ SOLN
INTRAMUSCULAR | Status: AC
  Filled 2022-09-24: qty 20

## 2022-09-24 MED ORDER — HEPARIN SODIUM (PORCINE) 5000 UNIT/ML IJ SOLN
INTRAMUSCULAR | Status: AC
  Filled 2022-09-24: qty 1

## 2022-09-24 MED ORDER — BUPIVACAINE HCL (PF) 0.5 % IJ SOLN
INTRAMUSCULAR | Status: DC | PRN
  Administered 2022-09-24: 20 mL via PERINEURAL
  Administered 2022-09-24: 10 mL via PERINEURAL

## 2022-09-24 MED ORDER — LIDOCAINE HCL (PF) 1 % IJ SOLN
INTRAMUSCULAR | Status: AC
  Filled 2022-09-24: qty 5

## 2022-09-24 MED ORDER — CHLORHEXIDINE GLUCONATE 0.12 % MT SOLN
OROMUCOSAL | Status: AC
  Administered 2022-09-24: 15 mL via OROMUCOSAL
  Filled 2022-09-24: qty 15

## 2022-09-24 MED ORDER — FENTANYL CITRATE (PF) 100 MCG/2ML IJ SOLN
INTRAMUSCULAR | Status: AC
  Filled 2022-09-24: qty 2

## 2022-09-24 MED ORDER — FENTANYL CITRATE PF 50 MCG/ML IJ SOSY: 50.0000 ug | PREFILLED_SYRINGE | Freq: Once | INTRAMUSCULAR | Status: AC

## 2022-09-24 MED ORDER — HYDROCODONE-ACETAMINOPHEN 5-325 MG PO TABS
1.0000 | ORAL_TABLET | Freq: Four times a day (QID) | ORAL | 0 refills | Status: DC | PRN
  Filled 2022-09-24: qty 30, 4d supply, fill #0

## 2022-09-24 MED ORDER — MIDAZOLAM HCL 2 MG/2ML IJ SOLN
INTRAMUSCULAR | Status: AC
  Administered 2022-09-24: 1 mg via INTRAVENOUS
  Filled 2022-09-24: qty 2

## 2022-09-24 MED ORDER — PROPOFOL 1000 MG/100ML IV EMUL
INTRAVENOUS | Status: AC
  Filled 2022-09-24: qty 100

## 2022-09-24 MED ORDER — FENTANYL CITRATE PF 50 MCG/ML IJ SOSY
PREFILLED_SYRINGE | INTRAMUSCULAR | Status: AC
  Filled 2022-09-24: qty 1

## 2022-09-24 MED ORDER — BUPIVACAINE HCL (PF) 0.5 % IJ SOLN
INTRAMUSCULAR | Status: AC
  Filled 2022-09-24: qty 30

## 2022-09-24 MED ORDER — PROPOFOL 10 MG/ML IV BOLUS
INTRAVENOUS | Status: AC
  Filled 2022-09-24: qty 40

## 2022-09-24 MED ORDER — INSULIN ASPART 100 UNIT/ML IJ SOLN
5.0000 [IU] | Freq: Once | INTRAMUSCULAR | Status: AC
  Administered 2022-09-24: 5 [IU] via SUBCUTANEOUS

## 2022-09-24 MED ORDER — HEPARIN SODIUM (PORCINE) 1000 UNIT/ML IJ SOLN
INTRAMUSCULAR | Status: DC | PRN
  Administered 2022-09-24: 4000 [IU] via INTRAVENOUS

## 2022-09-24 MED ORDER — ONDANSETRON HCL 4 MG/2ML IJ SOLN
INTRAMUSCULAR | Status: DC | PRN
  Administered 2022-09-24: 4 mg via INTRAVENOUS

## 2022-09-24 MED ORDER — INSULIN ASPART 100 UNIT/ML IJ SOLN
INTRAMUSCULAR | Status: AC
  Filled 2022-09-24: qty 1

## 2022-09-24 MED ORDER — HEPARIN 5000 UNITS IN NS 1000 ML (FLUSH)
INTRAMUSCULAR | Status: DC | PRN
  Administered 2022-09-24: 100 mL via INTRAMUSCULAR

## 2022-09-24 MED ORDER — GLYCOPYRROLATE 0.2 MG/ML IJ SOLN
INTRAMUSCULAR | Status: DC | PRN
  Administered 2022-09-24: .2 mg via INTRAVENOUS

## 2022-09-24 MED ORDER — PHENYLEPHRINE HCL (PRESSORS) 10 MG/ML IV SOLN
INTRAVENOUS | Status: DC | PRN
  Administered 2022-09-24 (×2): 80 ug via INTRAVENOUS

## 2022-09-24 MED ORDER — VANCOMYCIN HCL IN DEXTROSE 1-5 GM/200ML-% IV SOLN
INTRAVENOUS | Status: AC
  Administered 2022-09-24: 1000 mg via INTRAVENOUS
  Filled 2022-09-24: qty 200

## 2022-09-24 MED ORDER — MIDAZOLAM HCL 2 MG/2ML IJ SOLN: 1.0000 mg | INTRAMUSCULAR | Status: DC | PRN

## 2022-09-24 MED ORDER — BUPIVACAINE LIPOSOME 1.3 % IJ SUSP
INTRAMUSCULAR | Status: AC
  Filled 2022-09-24: qty 20

## 2022-09-24 SURGICAL SUPPLY — 64 items
APPLIER CLIP 11 MED OPEN (CLIP)
APPLIER CLIP 9.375 SM OPEN (CLIP)
BAG DECANTER FOR FLEXI CONT (MISCELLANEOUS) ×1 IMPLANT
BLADE SURG 15 STRL LF DISP TIS (BLADE) ×1 IMPLANT
BLADE SURG 15 STRL SS (BLADE) ×1
BLADE SURG SZ11 CARB STEEL (BLADE) ×1 IMPLANT
BOOT SUTURE AID YELLOW STND (SUTURE) ×1 IMPLANT
BRUSH SCRUB EZ  4% CHG (MISCELLANEOUS) ×1
BRUSH SCRUB EZ 4% CHG (MISCELLANEOUS) ×1 IMPLANT
CHLORAPREP W/TINT 26 (MISCELLANEOUS) ×1 IMPLANT
CLIP APPLIE 11 MED OPEN (CLIP) IMPLANT
CLIP APPLIE 9.375 SM OPEN (CLIP) IMPLANT
DERMABOND ADVANCED .7 DNX12 (GAUZE/BANDAGES/DRESSINGS) ×1 IMPLANT
DRESSING SURGICEL FIBRLLR 1X2 (HEMOSTASIS) ×1 IMPLANT
DRSG SURGICEL FIBRILLAR 1X2 (HEMOSTASIS) ×1
ELECT CAUTERY BLADE 6.4 (BLADE) ×1 IMPLANT
ELECT REM PT RETURN 9FT ADLT (ELECTROSURGICAL) ×1
ELECTRODE REM PT RTRN 9FT ADLT (ELECTROSURGICAL) ×1 IMPLANT
GEL ULTRASOUND 20GR AQUASONIC (MISCELLANEOUS) ×1 IMPLANT
GLOVE BIO SURGEON STRL SZ7 (GLOVE) ×1 IMPLANT
GLOVE SURG SYN 7.0 (GLOVE) ×3 IMPLANT
GLOVE SURG SYN 7.0 PF PI (GLOVE) ×1 IMPLANT
GLOVE SURG SYN 8.0 (GLOVE) ×3 IMPLANT
GLOVE SURG SYN 8.0 PF PI (GLOVE) ×1 IMPLANT
GOWN STRL REUS W/ TWL LRG LVL3 (GOWN DISPOSABLE) ×3 IMPLANT
GOWN STRL REUS W/ TWL XL LVL3 (GOWN DISPOSABLE) ×1 IMPLANT
GOWN STRL REUS W/TWL LRG LVL3 (GOWN DISPOSABLE) ×3
GOWN STRL REUS W/TWL XL LVL3 (GOWN DISPOSABLE) ×1
GRAFT COLLAGEN VASCULAR 8X45 (Miscellaneous) IMPLANT
IV NS 100ML SINGLE PACK (IV SOLUTION) IMPLANT
IV NS 500ML (IV SOLUTION) ×1
IV NS 500ML BAXH (IV SOLUTION) ×1 IMPLANT
KIT TURNOVER KIT A (KITS) ×1 IMPLANT
LABEL OR SOLS (LABEL) ×1 IMPLANT
LOOP RED MAXI  1X406MM (MISCELLANEOUS) ×1
LOOP VESSEL MAXI 1X406 RED (MISCELLANEOUS) ×1 IMPLANT
LOOP VESSEL MINI 0.8X406 BLUE (MISCELLANEOUS) ×2 IMPLANT
LOOPS BLUE MINI 0.8X406MM (MISCELLANEOUS) ×2
MANIFOLD NEPTUNE II (INSTRUMENTS) ×1 IMPLANT
NDL FILTER BLUNT 18X1 1/2 (NEEDLE) ×1 IMPLANT
NEEDLE FILTER BLUNT 18X1 1/2 (NEEDLE) ×1 IMPLANT
NS IRRIG 500ML POUR BTL (IV SOLUTION) ×1 IMPLANT
PACK EXTREMITY ARMC (MISCELLANEOUS) ×1 IMPLANT
PAD PREP 24X41 OB/GYN DISP (PERSONAL CARE ITEMS) ×1 IMPLANT
SPIKE FLUID TRANSFER (MISCELLANEOUS) ×1 IMPLANT
STOCKINETTE 48X4 2 PLY STRL (GAUZE/BANDAGES/DRESSINGS) ×1 IMPLANT
STOCKINETTE STRL 4IN 9604848 (GAUZE/BANDAGES/DRESSINGS) ×1 IMPLANT
SUT MNCRL+ 5-0 UNDYED PC-3 (SUTURE) ×1 IMPLANT
SUT MONOCRYL 5-0 (SUTURE) ×1
SUT PROLENE 6 0 BV (SUTURE) ×4 IMPLANT
SUT SILK 2 0 (SUTURE) ×1
SUT SILK 2-0 18XBRD TIE 12 (SUTURE) ×1 IMPLANT
SUT SILK 3 0 (SUTURE) ×1
SUT SILK 3-0 18XBRD TIE 12 (SUTURE) ×1 IMPLANT
SUT SILK 4 0 (SUTURE) ×1
SUT SILK 4-0 18XBRD TIE 12 (SUTURE) ×1 IMPLANT
SUT VIC AB 3-0 SH 27 (SUTURE) ×2
SUT VIC AB 3-0 SH 27X BRD (SUTURE) ×2 IMPLANT
SYR 20ML LL LF (SYRINGE) ×1 IMPLANT
SYR 3ML LL SCALE MARK (SYRINGE) ×1 IMPLANT
SYR TOOMEY IRRIG 70ML (MISCELLANEOUS) ×1
SYRINGE TOOMEY IRRIG 70ML (MISCELLANEOUS) IMPLANT
TRAP FLUID SMOKE EVACUATOR (MISCELLANEOUS) ×1 IMPLANT
WATER STERILE IRR 500ML POUR (IV SOLUTION) ×1 IMPLANT

## 2022-09-24 NOTE — Interval H&P Note (Signed)
History and Physical Interval Note:  09/24/2022 7:26 AM  [name]  has presented today for surgery, with the diagnosis of ESRD.  The various methods of treatment have been discussed with the patient and family. After consideration of risks, benefits and other options for treatment, the patient has consented to  Procedure(s): REVISON OF ARTERIOVENOUS FISTULA ([place]) (Left) as a surgical intervention.  The patient's history has been reviewed, patient examined, no change in status, stable for surgery.  I have reviewed the patient's chart and labs.  Questions were answered to the patient's satisfaction.     Hortencia Pilar

## 2022-09-24 NOTE — Progress Notes (Signed)
MRN : [id number]  [name] is a 51 y.o. (01/15/1971) male who presents with chief complaint of check access.  History of Present Illness:   The patient presents to [facility] for treatment regarding a problem with their dialysis access.   The fistula has failed to mature and angiogram dated 09/14/2022 shows the need for surgical revision.  The patient denies hand pain or other symptoms consistent with steal phenomena.  No significant arm swelling.  The patient denies redness or swelling at the access site. The patient denies fever or chills at home or while on dialysis.  No recent shortening of the patient's walking distance or new symptoms consistent with claudication.  No history of rest pain symptoms. No new ulcers or wounds of the lower extremities have occurred.  The patient denies amaurosis fugax or recent TIA symptoms. There are no recent neurological changes noted. There is no history of DVT, PE or superficial thrombophlebitis. No recent episodes of angina or shortness of breath documented.    Current Meds  Medication Sig   acetaminophen (TYLENOL) 500 MG tablet Take 500-1,000 mg by mouth every 6 (six) hours as needed for mild pain or fever.   ALPRAZolam (XANAX) 0.25 MG tablet Take 1 tablet (0.25 mg total) by mouth 2 (two) times daily as needed for Sleep for up to 60 days (Patient taking differently: Take 0.25 mg by mouth 2 (two) times daily as needed.)   amLODipine (NORVASC) 10 MG tablet TAKE 1 TABLET BY MOUTH ONCE DAILY (Patient taking differently: Take 10 mg by mouth every morning.)   atorvastatin (LIPITOR) 80 MG tablet TAKE 1 TABLET BY MOUTH ONCE DAILY (Patient taking differently: Take 80 mg by mouth every morning.)   carvedilol (COREG) 25 MG tablet Take one tablet by mouth 2 times daily with meals (Patient taking differently: Take 25 mg by mouth 2 (two) times daily with a meal.)   cinacalcet (SENSIPAR) 60 MG tablet Take 2 tablets (120 mg total)  by mouth daily.   cloNIDine (CATAPRES) 0.2 MG tablet Take 1 tablet (0.2 mg total) by mouth 2 (two) times daily.   Continuous Blood Gluc Sensor (DEXCOM G6 SENSOR) MISC Change every 10 days   insulin glargine-yfgn (SEMGLEE) 100 UNIT/ML Pen Inject 5 Units into the skin nightly. (Patient taking differently: Inject into the skin at bedtime.)   insulin lispro (HUMALOG) 100 UNIT/ML KwikPen Inject 50 Units into the skin daily in divided doses. (Patient taking differently: Inject 40-45 Units into the skin 3 (three) times daily. Uses SS)   Insulin Pen Needle (UNIFINE PENTIPS) 31G X 5 MM MISC Inject 1 Syringe into the skin 4 times daily. Use to inject insulin as prescribed.   Insulin Pen Needle 31G X 6 MM MISC Use 5 daily with insulin pen   losartan (COZAAR) 100 MG tablet TAKE 1 TABLET BY MOUTH ONCE DAILY (Patient taking differently: Take 100 mg by mouth every morning.)   minoxidil (LONITEN) 2.5 MG tablet Take 1 Tablet Oral as directed (Patient taking differently: Take 2.5 mg by mouth every morning.)   ondansetron (ZOFRAN) 4 MG tablet Take 1 tablet by mouth every 8 hours as needed for nausea or vomiting.   pantoprazole (PROTONIX) 40 MG tablet Take 1 tablet (40 mg total) by mouth once daily (Patient taking differently: Take 40 mg by mouth every morning.)   sucralfate (CARAFATE) 1 g tablet Take  1 tablet (1 g total) by mouth 4 (four) times daily before meals and nightly   sucroferric oxyhydroxide (VELPHORO) 500 MG chewable tablet Chew 2 tablets by mouth 3 times a day with food    Past Medical History:  Diagnosis Date   Cellulitis    Dialysis patient ([place])    M, W, F   DKA (diabetic ketoacidosis) ([place])    Esophageal dysphagia    ESRD (end stage renal disease) ([place])    Gastroparesis    Herniated cervical disc    HTN (hypertension)    Hypercholesteremia    Hypothyroidism    Morbid obesity ([facility])    OSA (obstructive sleep apnea)    Peritoneal dialysis status ([facility])    PONV (postoperative nausea and  vomiting)    after peritoneal dialysis catheter was placed   Presence of insulin pump    a.) parenteral insulin admin with pen as of 09/20/2022; no using pump   S/P cardiac cath    a. 10-15 yrs ago at [facility] due to tachycardia, reportedly normal. b. Normal ETT 03/2012.   Sepsis ([place])    Sinus tachycardia    a. 24-hr Holter 03/2012 - SR, occ PVCs, no VT, avg HR 96bpm.   TIA (transient ischemic attack)    Type 1 diabetes mellitus [facility])     Past Surgical History:  Procedure Laterality Date   A/V FISTULAGRAM Left 09/14/2022   Procedure: A/V Fistulagram;  Surgeon: Katha Cabal, MD;  Location: [place] CV LAB;  Service: Cardiovascular;  Laterality: Left;   AV FISTULA PLACEMENT Left 06/30/2022   Procedure: ARTERIOVENOUS (AV) FISTULA CREATION (BRACHIALCEPHALIC);  Surgeon: Katha Cabal, MD;  Location: [facility] ORS;  Service: Vascular;  Laterality: Left;   BIOPSY  10/03/2019   Procedure: BIOPSY;  Surgeon: Mauri Pole, MD;  Location: [facility];  Service: Endoscopy;;   CATARACT EXTRACTION Bilateral    cervical neck fusion     COLONOSCOPY WITH PROPOFOL N/A 01/02/2021   Procedure: COLONOSCOPY WITH PROPOFOL;  Surgeon: [name], MD;  Location: [place];  Service: Endoscopy;  Laterality: N/A;  priority 4 DIABETIC needs potassium draw   ESOPHAGOGASTRODUODENOSCOPY (EGD) WITH PROPOFOL N/A 10/03/2019   Procedure: ESOPHAGOGASTRODUODENOSCOPY (EGD) WITH PROPOFOL;  Surgeon: Mauri Pole, MD;  Location: [facility];  Service: Endoscopy;  Laterality: N/A;   EYE SURGERY Bilateral    for floaters   HERNIA REPAIR     bilateral   LEFT HEART CATH AND CORONARY ANGIOGRAPHY Left 10/16/2021   Procedure: LEFT HEART CATH AND CORONARY ANGIOGRAPHY;  Surgeon: Wellington Hampshire, MD;  Location: [place] CV LAB;  Service: Cardiovascular;  Laterality: Left;   LEFT HEART CATHETERIZATION WITH CORONARY ANGIOGRAM N/A 12/18/2013   Procedure: LEFT HEART CATHETERIZATION WITH CORONARY  ANGIOGRAM;  Surgeon: [name] M Martinique, MD;  Location: [facility] CATH LAB;  Service: Cardiovascular;  Laterality: N/A;   PERITONEAL CATHETER INSERTION     PERITONEAL CATHETER REMOVAL     POLYPECTOMY  01/02/2021   Procedure: POLYPECTOMY;  Surgeon: [name], MD;  Location: [facility];  Service: Endoscopy;;    Social History Social History   Tobacco Use   Smoking status: Never   Smokeless tobacco: Never  Vaping Use   Vaping Use: Never used  Substance Use Topics   Alcohol use: No   Drug use: No    Family History Family History  Problem Relation Age of Onset   Hypertension Other    Coronary artery disease Other  Mother's side - both her side's grandparents died of heart disease (MIs)   Diabetes Other    Stroke Other        Paternal grandfather (37)   Prostate cancer Neg Hx    Bladder Cancer Neg Hx    Kidney cancer Neg Hx     Allergies  Allergen Reactions   Sulfa Antibiotics Anaphylaxis and Swelling    Pt is not allergic to iodine, has had iodine in the past w/o premeds and w/ no problems   Oxycontin [Oxycodone Hcl] Nausea And Vomiting   Penicillins Other (See Comments)    Tolerated 1st generation cephalosporin (CEFAZOLIN) on 05/29/2020 & 06/30/2022 without documented ADRs.   Possible reaction many years ago per patient.  Has had PCN without issues since time of "reaction".    Prednisone Other (See Comments)    Patient is diabetic, runs sugar up     Shellfish-Derived Products Swelling    Eyes swell with shellfish Pt is not allergic to iodine, has had iodine in the past w/o premeds and w/ no problem   Pregabalin Other (See Comments)    Muscle twitching and jerks     REVIEW OF SYSTEMS (Negative unless checked)  Constitutional: '[]'$ Weight loss  '[]'$ Fever  '[]'$ Chills Cardiac: '[]'$ Chest pain   '[]'$ Chest pressure   '[]'$ Palpitations   '[]'$ Shortness of breath when laying flat   '[]'$ Shortness of breath with exertion. Vascular:  '[]'$ Pain in legs with walking   '[]'$ Pain in legs at  rest  '[]'$ History of DVT   '[]'$ Phlebitis   '[]'$ Swelling in legs   '[]'$ Varicose veins   '[]'$ Non-healing ulcers Pulmonary:   '[]'$ Uses home oxygen   '[]'$ Productive cough   '[]'$ Hemoptysis   '[]'$ Wheeze  '[]'$ COPD   '[]'$ Asthma Neurologic:  '[]'$ Dizziness   '[]'$ Seizures   '[]'$ History of stroke   '[]'$ History of TIA  '[]'$ Aphasia   '[]'$ Vissual changes   '[]'$ Weakness or numbness in arm   '[]'$ Weakness or numbness in leg Musculoskeletal:   '[]'$ Joint swelling   '[]'$ Joint pain   '[]'$ Low back pain Hematologic:  '[]'$ Easy bruising  '[]'$ Easy bleeding   '[]'$ Hypercoagulable state   '[]'$ Anemic Gastrointestinal:  '[]'$ Diarrhea   '[]'$ Vomiting  '[]'$ Gastroesophageal reflux/heartburn   '[]'$ Difficulty swallowing. Genitourinary:  '[x]'$ Chronic kidney disease   '[]'$ Difficult urination  '[]'$ Frequent urination   '[]'$ Blood in urine Skin:  '[]'$ Rashes   '[]'$ Ulcers  Psychological:  '[]'$ History of anxiety   '[]'$  History of major depression.  Physical Examination  Vitals:   09/24/22 0631 09/24/22 0715 09/24/22 0719  BP: (!) 150/65 (!) 162/67 (!) 144/67  Pulse: 63 64 65  Resp: '16 18 13  '$ Temp: 97.7 F (36.5 C)    TempSrc: Temporal    SpO2: 96% 100% 99%  Weight: 93 kg    Height: '5\' 11"'$  (1.803 m)     Body mass index is 28.59 kg/m. Gen: WD/WN, NAD Head: [place]/AT, No temporalis wasting.  Ear/Nose/Throat: Hearing grossly intact, nares w/o erythema or drainage Eyes: PER, EOMI, sclera nonicteric.  Neck: Supple, no gross masses or lesions.  No JVD.  Pulmonary:  Good air movement, no audible wheezing, no use of accessory muscles.  Cardiac: RRR, precordium non-hyperdynamic. Vascular:     left arm fistula is pulsatile and has a weak thrill Vessel Right Left  Radial Palpable Palpable  Brachial Palpable Palpable  Gastrointestinal: soft, non-distended. No guarding/no peritoneal signs.  Musculoskeletal: M/S 5/5 throughout.  No deformity.  Neurologic: CN 2-12 intact. Pain and light touch intact in extremities.  Symmetrical.  Speech is fluent. Motor exam as listed above. Psychiatric: Judgment intact,  Mood & affect  appropriate for pt's clinical situation. Dermatologic: No rashes or ulcers noted.  No changes consistent with cellulitis.   CBC Lab Results  Component Value Date   WBC 6.0 10/13/2021   HGB 12.2 (L) 06/30/2022   HCT 36.0 (L) 06/30/2022   MCV 88 10/13/2021   PLT 272 10/13/2021    BMET    Component Value Date/Time   NA 131 (L) 09/23/2022 1015   NA 136 10/13/2021 1218   K 5.5 (H) 09/23/2022 1015   CL 91 (L) 09/23/2022 1015   CO2 22 09/23/2022 1015   GLUCOSE 344 (H) 09/23/2022 1015   BUN 76 (H) 09/23/2022 1015   BUN 99 (HH) 10/13/2021 1218   CREATININE 7.83 (H) 09/23/2022 1015   CALCIUM 7.9 (L) 09/23/2022 1015   GFRNONAA 8 (L) 09/23/2022 1015   GFRAA 18 (L) 01/28/2020 0719   Estimated Creatinine Clearance: 13.2 mL/min (A) (by C-G formula based on SCr of 7.83 mg/dL (H)).  COAG Lab Results  Component Value Date   INR 0.90 08/28/2018   INR 1.07 09/07/2015   INR 1.01 12/17/2013    Radiology [place] OR NERVE BLOCK-IMAGE ONLY [facility])  Result Date: 09/24/2022 There is no interpretation for this exam.  This order is for images obtained during a surgical procedure.  Please See "Surgeries" Tab for more information regarding the procedure.   PERIPHERAL VASCULAR CATHETERIZATION  Result Date: 09/14/2022 See surgical note for result.    Assessment/Plan 1. ESRD (end stage renal disease) ([place]) Recommend:   The patient is experiencing increasing problems with their dialysis access.   Patient should have a revision of the fistula with artegraft.  The intention for surgery is to restore appropriate flow and prevent thrombosis and possible loss of the access.  As well as improve the quality of dialysis therapy.   The risks, benefits and alternative therapies were reviewed in detail with the patient.  All questions were answered.  The patient agrees to proceed with surgery.     The patient will follow up with me in the office after the procedure.  2. Primary hypertension Continue  antihypertensive medications as already ordered, these medications have been reviewed and there are no changes at this time.   Hortencia Pilar, MD  09/24/2022 7:21 AM

## 2022-09-24 NOTE — Op Note (Signed)
     OPERATIVE NOTE   PROCEDURE: left brachial cephalic arteriovenous fistula revision with Artegraft  PRE-OPERATIVE DIAGNOSIS: End Stage Renal Disease; complication dialysis access with failure of fistula to mature  POST-OPERATIVE DIAGNOSIS: Same  SURGEON: Hortencia Pilar  ASSISTANT(S): Warden Fillers pace, NP  ANESTHESIA: regional  ESTIMATED BLOOD LOSS: <50 cc  FINDING(S): 7 mm cephalic vein proximally but the previously identified greater than 80% stricture is present with multiple tributaries associated with it  SPECIMEN(S):  none  INDICATIONS:   [name] is a 51 y.o. male who presents with end stage renal disease.  The patient is scheduled for revision of left brachiocephalic arteriovenous fistula.  The patient is aware the risks include but are not limited to: bleeding, infection, steal syndrome, nerve damage, ischemic monomelic neuropathy, failure to mature, and need for additional procedures.  The patient is aware of the risks of the procedure and elects to proceed forward.  DESCRIPTION: After full informed written consent was obtained from the patient, the patient was brought back to the operating room and placed supine upon the operating table.  Prior to induction, the patient received IV antibiotics.   After obtaining adequate anesthesia, the patient was then prepped and draped in the standard fashion for a left arm access procedure.   A first assistant was required to provide a safe and appropriate environment for executing the surgery.  The assistant was integral in providing retraction, exposure, running suture providing suction and in the closing process.   A linear incision was then created over the cephalic vein beginning near the actual anastomosis and extending up the arm proximally. The cephalic vein/fistula was then identified and dissected circumferentially.  3 large tributaries were identified and these were ligated and divided between 2-0 silk ties.  The  vein was marked with a surgical marker.    A 7 mm Artegraft was then prepared on the back table.  The Artegraft was then marked with a surgical marker and a segment cut to an approximate length.  The fistula/cephalic vein was then clamped proximally and distally.  The cephalic vein/fistula was then transected and the diseased segment removed.  The Artegraft was delivered to the field and an end to end Artegraft to vein anastomosis was fashioned beginning at the arterial end.  6-0 Prolene was utilized in a four-quadrant technique.  The Artegraft was then pressurized and the suture line inspected.  No leaks were evident and the graft was flushed with heparinized saline and reclamped.  It was then approximated to the proximal cephalic vein.  Again an endograft and vein anastomosis was fashioned using 6-0 Prolene in a four-quadrant technique.  Flushing maneuvers were performed and the suture line was inspected for hemostasis.  2 interrupted sutures were required in the anterior wall and this achieved hemostasis.  Excellent thrill was noted and the wound was irrigated with sterile saline.  There was good  thrill in the venous outflow, and there was 1+ palpable radial pulse.  There was no further active bleeding.  Exparel was then infiltrated into the soft tissues and skin edge.  The subcutaneous tissue was reapproximated with a running stitch of 3-0 Vicryl.  The skin was then reapproximated with a running subcuticular stitch of 4-0 Vicryl.  The skin was then cleaned, dried, and reinforced with Dermabond.    The patient tolerated this procedure well.   COMPLICATIONS: None  CONDITION: [name]  Office: [phone]   09/24/2022, 9:39 AM

## 2022-09-24 NOTE — Transfer of Care (Signed)
Immediate Anesthesia Transfer of Care Note  Patient: [name]  Procedure(s) Performed: REVISON OF ARTERIOVENOUS FISTULA (BRACHIAL CEPHALIC WITH ARTEGRAFT) (Left)  Patient Location: PACU  Anesthesia Type:MAC and Regional  Level of Consciousness: drowsy and patient cooperative  Airway & Oxygen Therapy: Patient Spontanous Breathing and Patient connected to face mask oxygen  Post-op Assessment: Report given to RN and Post -op Vital signs reviewed and stable  Post vital signs: Reviewed and stable  Last Vitals:  Vitals Value Taken Time  BP 111/61 09/24/22 0941  Temp    Pulse 61 09/24/22 0945  Resp 16 09/24/22 0945  SpO2 100 % 09/24/22 0945  Vitals shown include unvalidated device data.  Last Pain:  Vitals:   09/24/22 0719  TempSrc:   PainSc: 0-No pain         Complications: No notable events documented.

## 2022-09-24 NOTE — Anesthesia Procedure Notes (Signed)
Anesthesia Regional Block: Supraclavicular block   Pre-Anesthetic Checklist: , timeout performed,  Correct Patient, Correct Site, Correct Laterality,  Correct Procedure, Correct Position, site marked,  Risks and benefits discussed,  Surgical consent,  Pre-op evaluation,  At surgeon's request and post-op pain management  Laterality: Upper and Left  Prep: chloraprep       Needles:  Injection technique: Single-shot  Needle Type: Stimiplex     Needle Length: 9cm  Needle Gauge: 22     Additional Needles:   Procedures:,,,, ultrasound used (permanent image in chart),,    Narrative:  Start time: 09/24/2022 7:19 AM End time: 09/24/2022 7:21 AM Injection made incrementally with aspirations every 5 mL.  Performed by: Personally  Anesthesiologist: Hadlie Gipson, Precious Haws, MD  Additional Notes: Patient consented for risk and benefits of nerve block including but not limited to nerve damage, Horner's syndrome, failed block, bleeding and infection.  Patient voiced understanding.  Functioning IV was confirmed and monitors were applied.  Timeout done prior to procedure and prior to any sedation being given to the patient.  Patient confirmed procedure site prior to any sedation given to the patient.  A 18m 22ga Stimuplex needle was used. Sterile prep,hand hygiene and sterile gloves were used.  Minimal sedation used for procedure.  No paresthesia endorsed by patient during the procedure.  Negative aspiration and negative test dose prior to incremental administration of local anesthetic. The patient tolerated the procedure well with no immediate complications.

## 2022-09-24 NOTE — H&P (View-Only) (Signed)
MRN : [id number]  [name] is a 51 y.o. ([birth year]) male who presents with chief complaint of check access.  History of Present Illness:   The patient presents to [facility] for treatment regarding a problem with their dialysis access.   The fistula has failed to mature and angiogram dated 09/14/2022 shows the need for surgical revision.  The patient denies hand pain or other symptoms consistent with steal phenomena.  No significant arm swelling.  The patient denies redness or swelling at the access site. The patient denies fever or chills at home or while on dialysis.  No recent shortening of the patient's walking distance or new symptoms consistent with claudication.  No history of rest pain symptoms. No new ulcers or wounds of the lower extremities have occurred.  The patient denies amaurosis fugax or recent TIA symptoms. There are no recent neurological changes noted. There is no history of DVT, PE or superficial thrombophlebitis. No recent episodes of angina or shortness of breath documented.    Current Meds  Medication Sig   acetaminophen (TYLENOL) 500 MG tablet Take 500-1,000 mg by mouth every 6 (six) hours as needed for mild pain or fever.   ALPRAZolam (XANAX) 0.25 MG tablet Take 1 tablet (0.25 mg total) by mouth 2 (two) times daily as needed for Sleep for up to 60 days (Patient taking differently: Take 0.25 mg by mouth 2 (two) times daily as needed.)   amLODipine (NORVASC) 10 MG tablet TAKE 1 TABLET BY MOUTH ONCE DAILY (Patient taking differently: Take 10 mg by mouth every morning.)   atorvastatin (LIPITOR) 80 MG tablet TAKE 1 TABLET BY MOUTH ONCE DAILY (Patient taking differently: Take 80 mg by mouth every morning.)   carvedilol (COREG) 25 MG tablet Take one tablet by mouth 2 times daily with meals (Patient taking differently: Take 25 mg by mouth 2 (two) times daily with a meal.)   cinacalcet (SENSIPAR) 60 MG tablet Take 2 tablets (120 mg total)  by mouth daily.   cloNIDine (CATAPRES) 0.2 MG tablet Take 1 tablet (0.2 mg total) by mouth 2 (two) times daily.   Continuous Blood Gluc Sensor (DEXCOM G6 SENSOR) MISC Change every 10 days   insulin glargine-yfgn (SEMGLEE) 100 UNIT/ML Pen Inject 5 Units into the skin nightly. (Patient taking differently: Inject into the skin at bedtime.)   insulin lispro (HUMALOG) 100 UNIT/ML KwikPen Inject 50 Units into the skin daily in divided doses. (Patient taking differently: Inject 40-45 Units into the skin 3 (three) times daily. Uses SS)   Insulin Pen Needle (UNIFINE PENTIPS) 31G X 5 MM MISC Inject 1 Syringe into the skin 4 times daily. Use to inject insulin as prescribed.   Insulin Pen Needle 31G X 6 MM MISC Use 5 daily with insulin pen   losartan (COZAAR) 100 MG tablet TAKE 1 TABLET BY MOUTH ONCE DAILY (Patient taking differently: Take 100 mg by mouth every morning.)   minoxidil (LONITEN) 2.5 MG tablet Take 1 Tablet Oral as directed (Patient taking differently: Take 2.5 mg by mouth every morning.)   ondansetron (ZOFRAN) 4 MG tablet Take 1 tablet by mouth every 8 hours as needed for nausea or vomiting.   pantoprazole (PROTONIX) 40 MG tablet Take 1 tablet (40 mg total) by mouth once daily (Patient taking differently: Take 40 mg by mouth every morning.)   sucralfate (CARAFATE) 1 g tablet Take  1 tablet (1 g total) by mouth 4 (four) times daily before meals and nightly   sucroferric oxyhydroxide (VELPHORO) 500 MG chewable tablet Chew 2 tablets by mouth 3 times a day with food    Past Medical History:  Diagnosis Date   Cellulitis    Dialysis patient ([place])    M, W, F   DKA (diabetic ketoacidosis) ([place])    Esophageal dysphagia    ESRD (end stage renal disease) ([place])    Gastroparesis    Herniated cervical disc    HTN (hypertension)    Hypercholesteremia    Hypothyroidism    Morbid obesity ([facility])    OSA (obstructive sleep apnea)    Peritoneal dialysis status ([facility])    PONV (postoperative nausea and  vomiting)    after peritoneal dialysis catheter was placed   Presence of insulin pump    a.) parenteral insulin admin with pen as of 09/20/2022; no using pump   S/P cardiac cath    a. 10-15 yrs ago at [facility] due to tachycardia, reportedly normal. b. Normal ETT 03/2012.   Sepsis ([place])    Sinus tachycardia    a. 24-hr Holter 03/2012 - SR, occ PVCs, no VT, avg HR 96bpm.   TIA (transient ischemic attack)    Type 1 diabetes mellitus [facility])     Past Surgical History:  Procedure Laterality Date   A/V FISTULAGRAM Left 09/14/2022   Procedure: A/V Fistulagram;  Surgeon: Katha Cabal, MD;  Location: [place] CV LAB;  Service: Cardiovascular;  Laterality: Left;   AV FISTULA PLACEMENT Left 06/30/2022   Procedure: ARTERIOVENOUS (AV) FISTULA CREATION (BRACHIALCEPHALIC);  Surgeon: Katha Cabal, MD;  Location: [facility] ORS;  Service: Vascular;  Laterality: Left;   BIOPSY  10/03/2019   Procedure: BIOPSY;  Surgeon: Mauri Pole, MD;  Location: [facility];  Service: Endoscopy;;   CATARACT EXTRACTION Bilateral    cervical neck fusion     COLONOSCOPY WITH PROPOFOL N/A 01/02/2021   Procedure: COLONOSCOPY WITH PROPOFOL;  Surgeon: [name], MD;  Location: [place];  Service: Endoscopy;  Laterality: N/A;  priority 4 DIABETIC needs potassium draw   ESOPHAGOGASTRODUODENOSCOPY (EGD) WITH PROPOFOL N/A 10/03/2019   Procedure: ESOPHAGOGASTRODUODENOSCOPY (EGD) WITH PROPOFOL;  Surgeon: Mauri Pole, MD;  [facility];  Service: Endoscopy;  Laterality: N/A;   EYE SURGERY Bilateral    for floaters   HERNIA REPAIR     bilateral   LEFT HEART CATH AND CORONARY ANGIOGRAPHY Left 10/16/2021   Procedure: LEFT HEART CATH AND CORONARY ANGIOGRAPHY;  Surgeon: Wellington Hampshire, MD;  Location: [place] CV LAB;  Service: Cardiovascular;  Laterality: Left;   LEFT HEART CATHETERIZATION WITH CORONARY ANGIOGRAM N/A 12/18/2013   Procedure: LEFT HEART CATHETERIZATION WITH CORONARY  ANGIOGRAM;  Surgeon: [name] M Martinique, MD;  Location: [facility] CATH LAB;  Service: Cardiovascular;  Laterality: N/A;   PERITONEAL CATHETER INSERTION     PERITONEAL CATHETER REMOVAL     POLYPECTOMY  01/02/2021   Procedure: POLYPECTOMY;  Surgeon: [name], MD;  [facility];  Service: Endoscopy;;    Social History Social History   Tobacco Use   Smoking status: Never   Smokeless tobacco: Never  Vaping Use   Vaping Use: Never used  Substance Use Topics   Alcohol use: No   Drug use: No    Family History Family History  Problem Relation Age of Onset   Hypertension Other    Coronary artery disease Other  Mother's side - both her side's grandparents died of heart disease (MIs)   Diabetes Other    Stroke Other        Paternal grandfather (85)   Prostate cancer Neg Hx    Bladder Cancer Neg Hx    Kidney cancer Neg Hx     Allergies  Allergen Reactions   Sulfa Antibiotics Anaphylaxis and Swelling    Pt is not allergic to iodine, has had iodine in the past w/o premeds and w/ no problems   Oxycontin [Oxycodone Hcl] Nausea And Vomiting   Penicillins Other (See Comments)    Tolerated 1st generation cephalosporin (CEFAZOLIN) on 05/29/2020 & 06/30/2022 without documented ADRs.   Possible reaction many years ago per patient.  Has had PCN without issues since time of "reaction".    Prednisone Other (See Comments)    Patient is diabetic, runs sugar up     Shellfish-Derived Products Swelling    Eyes swell with shellfish Pt is not allergic to iodine, has had iodine in the past w/o premeds and w/ no problem   Pregabalin Other (See Comments)    Muscle twitching and jerks     REVIEW OF SYSTEMS (Negative unless checked)  Constitutional: '[]'$ Weight loss  '[]'$ Fever  '[]'$ Chills Cardiac: '[]'$ Chest pain   '[]'$ Chest pressure   '[]'$ Palpitations   '[]'$ Shortness of breath when laying flat   '[]'$ Shortness of breath with exertion. Vascular:  '[]'$ Pain in legs with walking   '[]'$ Pain in legs at  rest  '[]'$ History of DVT   '[]'$ Phlebitis   '[]'$ Swelling in legs   '[]'$ Varicose veins   '[]'$ Non-healing ulcers Pulmonary:   '[]'$ Uses home oxygen   '[]'$ Productive cough   '[]'$ Hemoptysis   '[]'$ Wheeze  '[]'$ COPD   '[]'$ Asthma Neurologic:  '[]'$ Dizziness   '[]'$ Seizures   '[]'$ History of stroke   '[]'$ History of TIA  '[]'$ Aphasia   '[]'$ Vissual changes   '[]'$ Weakness or numbness in arm   '[]'$ Weakness or numbness in leg Musculoskeletal:   '[]'$ Joint swelling   '[]'$ Joint pain   '[]'$ Low back pain Hematologic:  '[]'$ Easy bruising  '[]'$ Easy bleeding   '[]'$ Hypercoagulable state   '[]'$ Anemic Gastrointestinal:  '[]'$ Diarrhea   '[]'$ Vomiting  '[]'$ Gastroesophageal reflux/heartburn   '[]'$ Difficulty swallowing. Genitourinary:  '[x]'$ Chronic kidney disease   '[]'$ Difficult urination  '[]'$ Frequent urination   '[]'$ Blood in urine Skin:  '[]'$ Rashes   '[]'$ Ulcers  Psychological:  '[]'$ History of anxiety   '[]'$  History of major depression.  Physical Examination  Vitals:   09/24/22 0631 09/24/22 0715 09/24/22 0719  BP: (!) 150/65 (!) 162/67 (!) 144/67  Pulse: 63 64 65  Resp: '16 18 13  '$ Temp: 97.7 F (36.5 C)    TempSrc: Temporal    SpO2: 96% 100% 99%  Weight: 93 kg    Height: '5\' 11"'$  (1.803 m)     Body mass index is 28.59 kg/m. Gen: WD/WN, NAD Head: [place]/AT, No temporalis wasting.  Ear/Nose/Throat: Hearing grossly intact, nares w/o erythema or drainage Eyes: PER, EOMI, sclera nonicteric.  Neck: Supple, no gross masses or lesions.  No JVD.  Pulmonary:  Good air movement, no audible wheezing, no use of accessory muscles.  Cardiac: RRR, precordium non-hyperdynamic. Vascular:     left arm fistula is pulsatile and has a weak thrill Vessel Right Left  Radial Palpable Palpable  Brachial Palpable Palpable  Gastrointestinal: soft, non-distended. No guarding/no peritoneal signs.  Musculoskeletal: M/S 5/5 throughout.  No deformity.  Neurologic: CN 2-12 intact. Pain and light touch intact in extremities.  Symmetrical.  Speech is fluent. Motor exam as listed above. Psychiatric: Judgment intact,  Mood & affect  appropriate for pt's clinical situation. Dermatologic: No rashes or ulcers noted.  No changes consistent with cellulitis.   CBC Lab Results  Component Value Date   WBC 6.0 10/13/2021   HGB 12.2 (L) 06/30/2022   HCT 36.0 (L) 06/30/2022   MCV 88 10/13/2021   PLT 272 10/13/2021    BMET    Component Value Date/Time   NA 131 (L) 09/23/2022 1015   NA 136 10/13/2021 1218   K 5.5 (H) 09/23/2022 1015   CL 91 (L) 09/23/2022 1015   CO2 22 09/23/2022 1015   GLUCOSE 344 (H) 09/23/2022 1015   BUN 76 (H) 09/23/2022 1015   BUN 99 (HH) 10/13/2021 1218   CREATININE 7.83 (H) 09/23/2022 1015   CALCIUM 7.9 (L) 09/23/2022 1015   GFRNONAA 8 (L) 09/23/2022 1015   GFRAA 18 (L) 01/28/2020 0719   Estimated Creatinine Clearance: 13.2 mL/min (A) (by C-G formula based on SCr of 7.83 mg/dL (H)).  COAG Lab Results  Component Value Date   INR 0.90 08/28/2018   INR 1.07 09/07/2015   INR 1.01 12/17/2013    Radiology [place] OR NERVE BLOCK-IMAGE ONLY [facility])  Result Date: 09/24/2022 There is no interpretation for this exam.  This order is for images obtained during a surgical procedure.  Please See "Surgeries" Tab for more information regarding the procedure.   PERIPHERAL VASCULAR CATHETERIZATION  Result Date: 09/14/2022 See surgical note for result.    Assessment/Plan 1. ESRD (end stage renal disease) ([place]) Recommend:   The patient is experiencing increasing problems with their dialysis access.   Patient should have a revision of the fistula with artegraft.  The intention for surgery is to restore appropriate flow and prevent thrombosis and possible loss of the access.  As well as improve the quality of dialysis therapy.   The risks, benefits and alternative therapies were reviewed in detail with the patient.  All questions were answered.  The patient agrees to proceed with surgery.     The patient will follow up with me in the office after the procedure.  2. Primary hypertension Continue  antihypertensive medications as already ordered, these medications have been reviewed and there are no changes at this time.   Hortencia Pilar, MD  09/24/2022 7:21 AM

## 2022-09-24 NOTE — Anesthesia Postprocedure Evaluation (Signed)
Anesthesia Post Note  Patient: [name]  Procedure(s) Performed: REVISON OF ARTERIOVENOUS FISTULA (BRACHIAL CEPHALIC WITH ARTEGRAFT) (Left)  Patient location during evaluation: PACU Anesthesia Type: General Level of consciousness: awake and alert Pain management: pain level controlled Vital Signs Assessment: post-procedure vital signs reviewed and stable Respiratory status: spontaneous breathing, nonlabored ventilation, respiratory function stable and patient connected to nasal cannula oxygen Cardiovascular status: blood pressure returned to baseline and stable Postop Assessment: no apparent nausea or vomiting Anesthetic complications: no   No notable events documented.   Last Vitals:  Vitals:   09/24/22 1030 09/24/22 1101  BP: (!) 151/71 (!) 143/57  Pulse: 64 63  Resp: 18 18  Temp: (!) 36.2 C 36.7 C  SpO2: 98% 93%    Last Pain:  Vitals:   09/24/22 1101  TempSrc: Temporal  PainSc: 0-No pain                 Precious Haws [name]

## 2022-09-24 NOTE — Discharge Instructions (Addendum)
AMBULATORY SURGERY  DISCHARGE INSTRUCTIONS   The drugs that you were given will stay in your system until tomorrow so for the next 24 hours you should not:  Drive an automobile Make any legal decisions Drink any alcoholic beverage  You may resume regular meals tomorrow.  Today it is better to start with liquids and gradually work up to solid foods.  You may eat anything you prefer, but it is better to start with liquids, then soup and crackers, and gradually work up to solid foods.    Please notify your doctor immediately if you have any unusual bleeding, trouble breathing, redness and pain at the surgery site, drainage, fever, or pain not relieved by medication.  Additional Instructions:  Please contact your physician with any problems or Same Day Surgery at [phone], Monday through Friday 6 am to 4 pm, or [place] at [facility] number at [phone].    Interscalene Nerve Block (ISNB) Discharge Instructions    For your surgery you have received an Interscalene Nerve Block. Nerve Blocks affect many types of nerves, including nerves that control movement, pain and normal sensation.  You may experience feelings such as numbness, tingling, heaviness, weakness or the inability to move your arm or the feeling or sensation that your arm has "fallen asleep". A nerve block can last for 2 - 36 hours or more depending on the medication used.  Usually the weakness wears off first.  The tingling and heaviness usually wear off next.  Finally you may start to notice pain.  Keep in mind that this may occur in any order.  once a nerve block starts to wear off it is usually completely gone within 60 minutes. ISNB may cause mild shortness of breath, a hoarse voice, blurry vision, unequal pupils, or drooping of the face on the same side as the nerve block.  These symptoms will usually go away within 12 hours.  Very rarely the procedure itself can cause mild seizures. If needed, your surgeon  will give you a prescription for pain medication.  It will take about 60 minutes for the oral pain medication to become fully effective.  So, it is recommended that you start taking this medication before the nerve block first begins to wear off, or when you first begin to feel discomfort. Keep in mind that nerve blocks often wear off in the middle of the night.   If you are going to bed and the block has not started to wear off or you have not started to have any discomfort, consider setting an alarm for 2 to 3 hours, so you can assess your block.  If you notice the block is wearing off or you are starting to have discomfort, you can take your pain medication. Take your pain medication only as prescribed.  Pain medication can cause sedation and decrease your breathing if you take more than you need for the level of pain that you have. Nausea is a common side effect of many pain medications.  You may want to eat something before taking your pain medicine to prevent nausea. After an Interscalene nerve block, you cannot feel pain, pressure or extremes in temperature in the effected arm.  Because your arm is numb it is at an increased risk for injury.  To decrease the possibility of injury, please practice the following:  While you are awake change the position of your arm frequently to prevent too much pressure on any one area for prolonged periods of time.  If you have a cast or tight dressing, check the color or your fingers every couple of hours.  Call your surgeon with the appearance of any discoloration (white or blue). If you are given a sling to wear before you go home, please wear it  at all times until the block has completely worn off.  Do not get up at night without your sling. If you experience any problems or concerns, please contact your surgeon's office. If you experience severe or prolonged shortness of breath go to the nearest emergency department.

## 2022-09-24 NOTE — Anesthesia Preprocedure Evaluation (Signed)
Anesthesia Evaluation  Patient identified by MRN, date of birth, ID band Patient awake    Reviewed: Allergy & Precautions, NPO status , Patient's Chart, lab work & pertinent test results  History of Anesthesia Complications (+) PONV and history of anesthetic complications  Airway Mallampati: III  TM Distance: <3 FB Neck ROM: full    Dental  (+) Chipped, Poor Dentition   Pulmonary neg shortness of breath, sleep apnea    Pulmonary exam normal        Cardiovascular Exercise Tolerance: Good hypertension, (-) angina (-) Past MI Normal cardiovascular exam     Neuro/Psych TIA negative psych ROS   GI/Hepatic Neg liver ROS, PUD,neg GERD  ,,  Endo/Other  diabetes, Type 2, Insulin DependentHypothyroidism    Renal/GU DialysisRenal disease  negative genitourinary   Musculoskeletal   Abdominal   Peds  Hematology negative hematology ROS (+)   Anesthesia Other Findings Past Medical History: No date: Cellulitis No date: Dialysis patient ([place])     Comment:  M, W, F No date: DKA (diabetic ketoacidosis) ([place]) No date: Esophageal dysphagia No date: ESRD (end stage renal disease) ([place]) No date: Gastroparesis No date: Herniated cervical disc No date: HTN (hypertension) No date: Hypercholesteremia No date: Hypothyroidism No date: Morbid obesity ([place]) No date: OSA (obstructive sleep apnea) No date: Peritoneal dialysis status ([place]) No date: PONV (postoperative nausea and vomiting)     Comment:  after peritoneal dialysis catheter was placed No date: Presence of insulin pump     Comment:  a.) parenteral insulin admin with pen as of 09/20/2022;               no using pump No date: S/P cardiac cath     Comment:  a. 10-15 yrs ago at [facility] due to tachycardia, reportedly               normal. b. Normal ETT 03/2012. No date: Sepsis ([place]) No date: Sinus tachycardia     Comment:  a. 24-hr Holter 03/2012 - SR, occ PVCs, no VT, avg HR                96bpm. No date: TIA (transient ischemic attack) No date: Type 1 diabetes mellitus ([place])  Past Surgical History: 09/14/2022: A/V FISTULAGRAM; Left     Comment:  Procedure: A/V Fistulagram;  Surgeon: Katha Cabal, MD;  Location: [place] CV LAB;  Service:               Cardiovascular;  Laterality: Left; 06/30/2022: AV FISTULA PLACEMENT; Left     Comment:  Procedure: ARTERIOVENOUS (AV) FISTULA CREATION               (BRACHIALCEPHALIC);  Surgeon: Katha Cabal, MD;                Location: [facility] ORS;  Service: Vascular;  Laterality:               Left; 10/03/2019: BIOPSY     Comment:  Procedure: BIOPSY;  Surgeon: Mauri Pole, MD;                [facility];  Service: Endoscopy;; No date: CATARACT EXTRACTION; Bilateral No date: cervical neck fusion 01/02/2021: COLONOSCOPY WITH PROPOFOL; N/A     Comment:  Procedure: COLONOSCOPY WITH PROPOFOL;  Surgeon: [name], MD;  Location: [place];  Service:               Endoscopy;  Laterality: N/A;  priority 4 DIABETIC needs               potassium draw 10/03/2019: ESOPHAGOGASTRODUODENOSCOPY (EGD) WITH PROPOFOL; N/A     Comment:  Procedure: ESOPHAGOGASTRODUODENOSCOPY (EGD) WITH               PROPOFOL;  Surgeon: Mauri Pole, MD;  Location:               [facility];  Service: Endoscopy;  Laterality: N/A; No date: EYE SURGERY; Bilateral     Comment:  for floaters No date: HERNIA REPAIR     Comment:  bilateral 10/16/2021: LEFT HEART CATH AND CORONARY ANGIOGRAPHY; Left     Comment:  Procedure: LEFT HEART CATH AND CORONARY ANGIOGRAPHY;                Surgeon: Wellington Hampshire, MD;  Location: [place]               CV LAB;  Service: Cardiovascular;  Laterality: Left; 12/18/2013: LEFT HEART CATHETERIZATION WITH CORONARY ANGIOGRAM; N/A     Comment:  Procedure: LEFT HEART CATHETERIZATION WITH CORONARY               ANGIOGRAM;  Surgeon: [name] M Martinique, MD;   Location: [facility]               CATH LAB;  Service: Cardiovascular;  Laterality: N/A; No date: PERITONEAL CATHETER INSERTION No date: PERITONEAL CATHETER REMOVAL 01/02/2021: POLYPECTOMY     Comment:  Procedure: POLYPECTOMY;  Surgeon: [name], MD;                Location: [place];  Service: Endoscopy;;  BMI    Body Mass Index: 28.59 kg/m      Reproductive/Obstetrics negative OB ROS                             Anesthesia Physical Anesthesia Plan  ASA: 4  Anesthesia Plan: General   Post-op Pain Management: Regional block*   Induction: Intravenous  PONV Risk Score and Plan: Propofol infusion and TIVA  Airway Management Planned: Natural Airway and Nasal Cannula  Additional Equipment:   Intra-op Plan:   Post-operative Plan:   Informed Consent: I have reviewed the patients History and Physical, chart, labs and discussed the procedure including the risks, benefits and alternatives for the proposed anesthesia with the patient or authorized representative who has indicated his/her understanding and acceptance.     Dental Advisory Given  Plan Discussed with: Anesthesiologist, CRNA and Surgeon  Anesthesia Plan Comments: (Patient consented for risks of anesthesia including but not limited to:  - adverse reactions to medications - risk of airway placement if required - damage to eyes, teeth, lips or other oral mucosa - nerve damage due to positioning  - sore throat or hoarseness - Damage to heart, brain, nerves, lungs, other parts of body or loss of life  Patient voiced understanding.)       Anesthesia Quick Evaluation

## 2022-09-25 DIAGNOSIS — Z992 Dependence on renal dialysis: Secondary | ICD-10-CM | POA: Diagnosis not present

## 2022-09-25 DIAGNOSIS — E8770 Fluid overload, unspecified: Secondary | ICD-10-CM | POA: Diagnosis not present

## 2022-09-25 DIAGNOSIS — N186 End stage renal disease: Secondary | ICD-10-CM | POA: Diagnosis not present

## 2022-09-26 DIAGNOSIS — E8770 Fluid overload, unspecified: Secondary | ICD-10-CM | POA: Diagnosis not present

## 2022-09-26 DIAGNOSIS — N186 End stage renal disease: Secondary | ICD-10-CM | POA: Diagnosis not present

## 2022-09-26 DIAGNOSIS — Z992 Dependence on renal dialysis: Secondary | ICD-10-CM | POA: Diagnosis not present

## 2022-09-27 ENCOUNTER — Encounter: Payer: Self-pay | Admitting: Vascular Surgery

## 2022-09-28 LAB — SURGICAL PATHOLOGY

## 2022-09-29 DIAGNOSIS — N186 End stage renal disease: Secondary | ICD-10-CM | POA: Diagnosis not present

## 2022-09-29 DIAGNOSIS — Z992 Dependence on renal dialysis: Secondary | ICD-10-CM | POA: Diagnosis not present

## 2022-09-29 DIAGNOSIS — E8770 Fluid overload, unspecified: Secondary | ICD-10-CM | POA: Diagnosis not present

## 2022-10-01 DIAGNOSIS — N186 End stage renal disease: Secondary | ICD-10-CM | POA: Diagnosis not present

## 2022-10-01 DIAGNOSIS — E8770 Fluid overload, unspecified: Secondary | ICD-10-CM | POA: Diagnosis not present

## 2022-10-01 DIAGNOSIS — Z992 Dependence on renal dialysis: Secondary | ICD-10-CM | POA: Diagnosis not present

## 2022-10-02 DIAGNOSIS — E8770 Fluid overload, unspecified: Secondary | ICD-10-CM | POA: Diagnosis not present

## 2022-10-02 DIAGNOSIS — N186 End stage renal disease: Secondary | ICD-10-CM | POA: Diagnosis not present

## 2022-10-02 DIAGNOSIS — Z992 Dependence on renal dialysis: Secondary | ICD-10-CM | POA: Diagnosis not present

## 2022-10-03 DIAGNOSIS — N186 End stage renal disease: Secondary | ICD-10-CM | POA: Diagnosis not present

## 2022-10-03 DIAGNOSIS — Z992 Dependence on renal dialysis: Secondary | ICD-10-CM | POA: Diagnosis not present

## 2022-10-06 DIAGNOSIS — J9811 Atelectasis: Secondary | ICD-10-CM | POA: Diagnosis not present

## 2022-10-06 DIAGNOSIS — E1022 Type 1 diabetes mellitus with diabetic chronic kidney disease: Secondary | ICD-10-CM | POA: Diagnosis not present

## 2022-10-06 DIAGNOSIS — I10 Essential (primary) hypertension: Secondary | ICD-10-CM | POA: Diagnosis not present

## 2022-10-06 DIAGNOSIS — E1042 Type 1 diabetes mellitus with diabetic polyneuropathy: Secondary | ICD-10-CM | POA: Diagnosis not present

## 2022-10-06 DIAGNOSIS — E669 Obesity, unspecified: Secondary | ICD-10-CM | POA: Diagnosis not present

## 2022-10-06 DIAGNOSIS — E039 Hypothyroidism, unspecified: Secondary | ICD-10-CM | POA: Diagnosis not present

## 2022-10-06 DIAGNOSIS — I272 Pulmonary hypertension, unspecified: Secondary | ICD-10-CM | POA: Diagnosis not present

## 2022-10-06 DIAGNOSIS — N186 End stage renal disease: Secondary | ICD-10-CM | POA: Diagnosis not present

## 2022-10-06 DIAGNOSIS — Z01818 Encounter for other preprocedural examination: Secondary | ICD-10-CM | POA: Diagnosis not present

## 2022-10-06 DIAGNOSIS — E10649 Type 1 diabetes mellitus with hypoglycemia without coma: Secondary | ICD-10-CM | POA: Diagnosis not present

## 2022-10-06 DIAGNOSIS — D8489 Other immunodeficiencies: Secondary | ICD-10-CM | POA: Diagnosis not present

## 2022-10-06 DIAGNOSIS — Z992 Dependence on renal dialysis: Secondary | ICD-10-CM | POA: Diagnosis not present

## 2022-10-07 DIAGNOSIS — E1322 Other specified diabetes mellitus with diabetic chronic kidney disease: Secondary | ICD-10-CM | POA: Diagnosis not present

## 2022-10-07 DIAGNOSIS — Z940 Kidney transplant status: Secondary | ICD-10-CM

## 2022-10-07 DIAGNOSIS — G8918 Other acute postprocedural pain: Secondary | ICD-10-CM | POA: Diagnosis not present

## 2022-10-07 DIAGNOSIS — N186 End stage renal disease: Secondary | ICD-10-CM | POA: Diagnosis not present

## 2022-10-07 DIAGNOSIS — E1021 Type 1 diabetes mellitus with diabetic nephropathy: Secondary | ICD-10-CM | POA: Diagnosis not present

## 2022-10-07 DIAGNOSIS — I120 Hypertensive chronic kidney disease with stage 5 chronic kidney disease or end stage renal disease: Secondary | ICD-10-CM | POA: Diagnosis not present

## 2022-10-07 DIAGNOSIS — Z992 Dependence on renal dialysis: Secondary | ICD-10-CM | POA: Diagnosis not present

## 2022-10-08 DIAGNOSIS — Z940 Kidney transplant status: Secondary | ICD-10-CM | POA: Diagnosis not present

## 2022-10-08 DIAGNOSIS — D849 Immunodeficiency, unspecified: Secondary | ICD-10-CM | POA: Diagnosis not present

## 2022-10-08 DIAGNOSIS — N186 End stage renal disease: Secondary | ICD-10-CM | POA: Diagnosis not present

## 2022-10-08 DIAGNOSIS — I10 Essential (primary) hypertension: Secondary | ICD-10-CM | POA: Diagnosis not present

## 2022-10-08 DIAGNOSIS — Z992 Dependence on renal dialysis: Secondary | ICD-10-CM | POA: Diagnosis not present

## 2022-10-08 DIAGNOSIS — Z792 Long term (current) use of antibiotics: Secondary | ICD-10-CM | POA: Diagnosis not present

## 2022-10-08 DIAGNOSIS — Z9483 Pancreas transplant status: Secondary | ICD-10-CM | POA: Diagnosis not present

## 2022-10-09 DIAGNOSIS — N186 End stage renal disease: Secondary | ICD-10-CM | POA: Diagnosis not present

## 2022-10-09 DIAGNOSIS — Z9483 Pancreas transplant status: Secondary | ICD-10-CM | POA: Diagnosis not present

## 2022-10-09 DIAGNOSIS — Z792 Long term (current) use of antibiotics: Secondary | ICD-10-CM | POA: Diagnosis not present

## 2022-10-09 DIAGNOSIS — G8918 Other acute postprocedural pain: Secondary | ICD-10-CM | POA: Diagnosis not present

## 2022-10-09 DIAGNOSIS — D849 Immunodeficiency, unspecified: Secondary | ICD-10-CM | POA: Diagnosis not present

## 2022-10-09 DIAGNOSIS — Z940 Kidney transplant status: Secondary | ICD-10-CM | POA: Diagnosis not present

## 2022-10-09 DIAGNOSIS — I10 Essential (primary) hypertension: Secondary | ICD-10-CM | POA: Diagnosis not present

## 2022-10-09 DIAGNOSIS — Z992 Dependence on renal dialysis: Secondary | ICD-10-CM | POA: Diagnosis not present

## 2022-10-09 DIAGNOSIS — E1021 Type 1 diabetes mellitus with diabetic nephropathy: Secondary | ICD-10-CM | POA: Diagnosis not present

## 2022-10-10 DIAGNOSIS — Z992 Dependence on renal dialysis: Secondary | ICD-10-CM | POA: Diagnosis not present

## 2022-10-10 DIAGNOSIS — D849 Immunodeficiency, unspecified: Secondary | ICD-10-CM | POA: Diagnosis not present

## 2022-10-10 DIAGNOSIS — I10 Essential (primary) hypertension: Secondary | ICD-10-CM | POA: Diagnosis not present

## 2022-10-10 DIAGNOSIS — N186 End stage renal disease: Secondary | ICD-10-CM | POA: Diagnosis not present

## 2022-10-10 DIAGNOSIS — Z940 Kidney transplant status: Secondary | ICD-10-CM | POA: Diagnosis not present

## 2022-10-10 DIAGNOSIS — G8918 Other acute postprocedural pain: Secondary | ICD-10-CM | POA: Diagnosis not present

## 2022-10-10 DIAGNOSIS — Z792 Long term (current) use of antibiotics: Secondary | ICD-10-CM | POA: Diagnosis not present

## 2022-10-10 DIAGNOSIS — E1021 Type 1 diabetes mellitus with diabetic nephropathy: Secondary | ICD-10-CM | POA: Diagnosis not present

## 2022-10-10 DIAGNOSIS — Z9483 Pancreas transplant status: Secondary | ICD-10-CM | POA: Diagnosis not present

## 2022-10-11 DIAGNOSIS — D849 Immunodeficiency, unspecified: Secondary | ICD-10-CM | POA: Diagnosis not present

## 2022-10-11 DIAGNOSIS — Z9483 Pancreas transplant status: Secondary | ICD-10-CM | POA: Diagnosis not present

## 2022-10-11 DIAGNOSIS — N186 End stage renal disease: Secondary | ICD-10-CM | POA: Diagnosis not present

## 2022-10-11 DIAGNOSIS — I10 Essential (primary) hypertension: Secondary | ICD-10-CM | POA: Diagnosis not present

## 2022-10-11 DIAGNOSIS — Z992 Dependence on renal dialysis: Secondary | ICD-10-CM | POA: Diagnosis not present

## 2022-10-11 DIAGNOSIS — Z452 Encounter for adjustment and management of vascular access device: Secondary | ICD-10-CM | POA: Diagnosis not present

## 2022-10-11 DIAGNOSIS — Z01818 Encounter for other preprocedural examination: Secondary | ICD-10-CM | POA: Diagnosis not present

## 2022-10-11 DIAGNOSIS — G8918 Other acute postprocedural pain: Secondary | ICD-10-CM | POA: Diagnosis not present

## 2022-10-11 DIAGNOSIS — E1021 Type 1 diabetes mellitus with diabetic nephropathy: Secondary | ICD-10-CM | POA: Diagnosis not present

## 2022-10-11 DIAGNOSIS — Z940 Kidney transplant status: Secondary | ICD-10-CM | POA: Diagnosis not present

## 2022-10-12 DIAGNOSIS — Z01818 Encounter for other preprocedural examination: Secondary | ICD-10-CM | POA: Diagnosis not present

## 2022-10-13 ENCOUNTER — Other Ambulatory Visit (INDEPENDENT_AMBULATORY_CARE_PROVIDER_SITE_OTHER): Payer: Self-pay | Admitting: Vascular Surgery

## 2022-10-13 DIAGNOSIS — Z01818 Encounter for other preprocedural examination: Secondary | ICD-10-CM | POA: Diagnosis not present

## 2022-10-13 DIAGNOSIS — Z9483 Pancreas transplant status: Secondary | ICD-10-CM | POA: Diagnosis not present

## 2022-10-13 DIAGNOSIS — N186 End stage renal disease: Secondary | ICD-10-CM

## 2022-10-13 DIAGNOSIS — Z940 Kidney transplant status: Secondary | ICD-10-CM | POA: Diagnosis not present

## 2022-10-14 DIAGNOSIS — Z940 Kidney transplant status: Secondary | ICD-10-CM | POA: Diagnosis not present

## 2022-10-14 DIAGNOSIS — I10 Essential (primary) hypertension: Secondary | ICD-10-CM | POA: Diagnosis not present

## 2022-10-14 DIAGNOSIS — Z01818 Encounter for other preprocedural examination: Secondary | ICD-10-CM | POA: Diagnosis not present

## 2022-10-14 DIAGNOSIS — D849 Immunodeficiency, unspecified: Secondary | ICD-10-CM | POA: Diagnosis not present

## 2022-10-14 DIAGNOSIS — Z9483 Pancreas transplant status: Secondary | ICD-10-CM | POA: Diagnosis not present

## 2022-10-19 ENCOUNTER — Ambulatory Visit (INDEPENDENT_AMBULATORY_CARE_PROVIDER_SITE_OTHER): Payer: Medicare Other | Admitting: Nurse Practitioner

## 2022-10-19 ENCOUNTER — Encounter (INDEPENDENT_AMBULATORY_CARE_PROVIDER_SITE_OTHER): Payer: Medicare Other

## 2022-10-21 DIAGNOSIS — I10 Essential (primary) hypertension: Secondary | ICD-10-CM | POA: Diagnosis not present

## 2022-10-21 DIAGNOSIS — Z5181 Encounter for therapeutic drug level monitoring: Secondary | ICD-10-CM | POA: Diagnosis not present

## 2022-10-21 DIAGNOSIS — Z9483 Pancreas transplant status: Secondary | ICD-10-CM | POA: Diagnosis not present

## 2022-10-21 DIAGNOSIS — D72829 Elevated white blood cell count, unspecified: Secondary | ICD-10-CM | POA: Diagnosis not present

## 2022-10-21 DIAGNOSIS — D849 Immunodeficiency, unspecified: Secondary | ICD-10-CM | POA: Diagnosis not present

## 2022-10-21 DIAGNOSIS — B998 Other infectious disease: Secondary | ICD-10-CM | POA: Diagnosis not present

## 2022-10-21 DIAGNOSIS — Z940 Kidney transplant status: Secondary | ICD-10-CM | POA: Diagnosis not present

## 2022-10-21 DIAGNOSIS — R739 Hyperglycemia, unspecified: Secondary | ICD-10-CM | POA: Diagnosis not present

## 2022-10-21 DIAGNOSIS — G8918 Other acute postprocedural pain: Secondary | ICD-10-CM | POA: Diagnosis not present

## 2022-10-21 DIAGNOSIS — Z960 Presence of urogenital implants: Secondary | ICD-10-CM | POA: Diagnosis not present

## 2022-10-21 DIAGNOSIS — Z9189 Other specified personal risk factors, not elsewhere classified: Secondary | ICD-10-CM | POA: Diagnosis not present

## 2022-10-21 DIAGNOSIS — Z792 Long term (current) use of antibiotics: Secondary | ICD-10-CM | POA: Diagnosis not present

## 2022-10-22 ENCOUNTER — Other Ambulatory Visit: Payer: Self-pay

## 2022-10-22 MED ORDER — LOKELMA 5 G PO PACK
1.0000 | PACK | Freq: Every day | ORAL | 0 refills | Status: DC
  Filled 2022-10-22: qty 30, 30d supply, fill #0

## 2022-10-28 ENCOUNTER — Other Ambulatory Visit: Payer: Self-pay

## 2022-10-28 DIAGNOSIS — Z792 Long term (current) use of antibiotics: Secondary | ICD-10-CM | POA: Diagnosis not present

## 2022-10-28 DIAGNOSIS — Z960 Presence of urogenital implants: Secondary | ICD-10-CM | POA: Diagnosis not present

## 2022-10-28 DIAGNOSIS — G8918 Other acute postprocedural pain: Secondary | ICD-10-CM | POA: Diagnosis not present

## 2022-10-28 DIAGNOSIS — R739 Hyperglycemia, unspecified: Secondary | ICD-10-CM | POA: Diagnosis not present

## 2022-10-28 DIAGNOSIS — T8130XA Disruption of wound, unspecified, initial encounter: Secondary | ICD-10-CM | POA: Diagnosis not present

## 2022-10-28 DIAGNOSIS — N390 Urinary tract infection, site not specified: Secondary | ICD-10-CM | POA: Diagnosis not present

## 2022-10-28 DIAGNOSIS — Z9189 Other specified personal risk factors, not elsewhere classified: Secondary | ICD-10-CM | POA: Diagnosis not present

## 2022-10-28 DIAGNOSIS — I10 Essential (primary) hypertension: Secondary | ICD-10-CM | POA: Diagnosis not present

## 2022-10-28 DIAGNOSIS — D849 Immunodeficiency, unspecified: Secondary | ICD-10-CM | POA: Diagnosis not present

## 2022-10-28 DIAGNOSIS — Z9483 Pancreas transplant status: Secondary | ICD-10-CM | POA: Diagnosis not present

## 2022-10-28 DIAGNOSIS — E875 Hyperkalemia: Secondary | ICD-10-CM | POA: Diagnosis not present

## 2022-10-28 DIAGNOSIS — Z940 Kidney transplant status: Secondary | ICD-10-CM | POA: Diagnosis not present

## 2022-10-28 MED ORDER — HYDRALAZINE HCL 50 MG PO TABS
50.0000 mg | ORAL_TABLET | Freq: Three times a day (TID) | ORAL | 11 refills | Status: DC
  Filled 2022-10-28: qty 90, 30d supply, fill #0
  Filled 2022-11-01: qty 270, 90d supply, fill #0

## 2022-10-28 MED ORDER — CARVEDILOL 25 MG PO TABS
25.0000 mg | ORAL_TABLET | Freq: Two times a day (BID) | ORAL | 11 refills | Status: DC
  Filled 2022-10-28: qty 60, 30d supply, fill #0
  Filled 2022-11-01: qty 180, 90d supply, fill #0
  Filled 2022-11-09: qty 60, 30d supply, fill #0

## 2022-10-28 MED ORDER — ATOVAQUONE 750 MG/5ML PO SUSP
1500.0000 mg | Freq: Every day | ORAL | 5 refills | Status: DC
  Filled 2022-10-28: qty 300, 30d supply, fill #0
  Filled 2022-12-01: qty 300, 30d supply, fill #1
  Filled 2023-01-10: qty 300, 30d supply, fill #2
  Filled 2023-02-15: qty 245, 24d supply, fill #3
  Filled 2023-02-15: qty 55, 6d supply, fill #3
  Filled 2023-03-16: qty 300, 30d supply, fill #4
  Filled 2023-04-18: qty 240, 24d supply, fill #5
  Filled 2023-04-18: qty 60, 6d supply, fill #5

## 2022-10-28 MED ORDER — AMLODIPINE BESYLATE 10 MG PO TABS
10.0000 mg | ORAL_TABLET | Freq: Every day | ORAL | 11 refills | Status: DC
  Filled 2022-10-28: qty 30, 30d supply, fill #0

## 2022-10-28 MED ORDER — PREDNISONE 5 MG PO TABS
30.0000 mg | ORAL_TABLET | Freq: Every day | ORAL | 11 refills | Status: DC
  Filled 2022-10-28 – 2022-11-09 (×3): qty 180, 30d supply, fill #0
  Filled 2022-12-17: qty 540, 90d supply, fill #1

## 2022-10-28 MED ORDER — FAMOTIDINE 20 MG PO TABS
20.0000 mg | ORAL_TABLET | Freq: Every day | ORAL | 5 refills | Status: DC
  Filled 2022-10-28: qty 30, 30d supply, fill #0
  Filled 2022-11-01: qty 90, 90d supply, fill #0

## 2022-10-28 MED ORDER — TAMSULOSIN HCL 0.4 MG PO CAPS
0.4000 mg | ORAL_CAPSULE | Freq: Every day | ORAL | 5 refills | Status: DC
  Filled 2022-10-28: qty 30, 30d supply, fill #0

## 2022-10-28 MED ORDER — VALGANCICLOVIR HCL 450 MG PO TABS
900.0000 mg | ORAL_TABLET | Freq: Every day | ORAL | 5 refills | Status: DC
  Filled 2022-10-28 – 2022-11-04 (×3): qty 60, 30d supply, fill #0
  Filled 2022-12-01: qty 60, 30d supply, fill #1
  Filled 2023-01-10: qty 60, 30d supply, fill #2
  Filled 2023-02-15: qty 60, 30d supply, fill #3
  Filled 2023-03-16: qty 60, 30d supply, fill #4

## 2022-10-28 MED ORDER — TACROLIMUS 1 MG PO CAPS
ORAL_CAPSULE | ORAL | 11 refills | Status: DC
  Filled 2022-10-28: qty 90, 30d supply, fill #0

## 2022-10-28 MED ORDER — MYCOPHENOLATE MOFETIL 250 MG PO CAPS
1000.0000 mg | ORAL_CAPSULE | Freq: Two times a day (BID) | ORAL | 11 refills | Status: AC
  Filled 2022-10-28: qty 240, 30d supply, fill #0
  Filled 2022-11-01: qty 720, 90d supply, fill #0
  Filled 2022-11-09 (×2): qty 240, 30d supply, fill #0
  Filled 2022-12-01: qty 240, 30d supply, fill #1

## 2022-10-29 ENCOUNTER — Other Ambulatory Visit: Payer: Self-pay

## 2022-10-29 MED ORDER — ATORVASTATIN CALCIUM 80 MG PO TABS
80.0000 mg | ORAL_TABLET | Freq: Every day | ORAL | 11 refills | Status: AC
  Filled 2022-10-29: qty 30, 30d supply, fill #0
  Filled 2022-12-17: qty 90, 90d supply, fill #1
  Filled 2023-03-16: qty 90, 90d supply, fill #2
  Filled 2023-07-05: qty 30, 30d supply, fill #3

## 2022-10-29 MED ORDER — TACROLIMUS 1 MG PO CAPS
2.0000 mg | ORAL_CAPSULE | Freq: Two times a day (BID) | ORAL | 11 refills | Status: DC
  Filled 2022-10-29 (×2): qty 120, 30d supply, fill #0
  Filled 2023-04-18: qty 120, 30d supply, fill #1

## 2022-10-29 MED ORDER — HYDRALAZINE HCL 50 MG PO TABS
50.0000 mg | ORAL_TABLET | Freq: Three times a day (TID) | ORAL | 11 refills | Status: DC
  Filled 2022-10-29 – 2022-11-09 (×2): qty 90, 30d supply, fill #0
  Filled 2023-01-10: qty 90, 30d supply, fill #1

## 2022-10-29 MED ORDER — DOXYCYCLINE HYCLATE 100 MG PO CAPS
100.0000 mg | ORAL_CAPSULE | Freq: Two times a day (BID) | ORAL | 0 refills | Status: DC
  Filled 2022-10-29: qty 14, 7d supply, fill #0

## 2022-11-01 ENCOUNTER — Other Ambulatory Visit: Payer: Self-pay

## 2022-11-02 ENCOUNTER — Other Ambulatory Visit: Payer: Self-pay

## 2022-11-03 ENCOUNTER — Other Ambulatory Visit: Payer: Self-pay

## 2022-11-03 DIAGNOSIS — Z992 Dependence on renal dialysis: Secondary | ICD-10-CM | POA: Diagnosis not present

## 2022-11-03 DIAGNOSIS — N186 End stage renal disease: Secondary | ICD-10-CM | POA: Diagnosis not present

## 2022-11-04 ENCOUNTER — Other Ambulatory Visit: Payer: Self-pay

## 2022-11-05 ENCOUNTER — Other Ambulatory Visit: Payer: Self-pay

## 2022-11-05 MED ORDER — TACROLIMUS 1 MG PO CAPS
ORAL_CAPSULE | ORAL | 11 refills | Status: DC
  Filled 2022-11-05 – 2022-12-01 (×2): qty 150, 30d supply, fill #0
  Filled 2022-12-17: qty 450, 90d supply, fill #1

## 2022-11-08 ENCOUNTER — Other Ambulatory Visit: Payer: Self-pay

## 2022-11-09 ENCOUNTER — Other Ambulatory Visit: Payer: Self-pay

## 2022-11-16 ENCOUNTER — Ambulatory Visit (INDEPENDENT_AMBULATORY_CARE_PROVIDER_SITE_OTHER): Payer: Medicare Other | Admitting: Podiatry

## 2022-11-16 ENCOUNTER — Other Ambulatory Visit: Payer: Self-pay

## 2022-11-16 DIAGNOSIS — E0843 Diabetes mellitus due to underlying condition with diabetic autonomic (poly)neuropathy: Secondary | ICD-10-CM

## 2022-11-16 DIAGNOSIS — L97512 Non-pressure chronic ulcer of other part of right foot with fat layer exposed: Secondary | ICD-10-CM | POA: Diagnosis not present

## 2022-11-16 MED ORDER — HYDRALAZINE HCL 100 MG PO TABS
100.0000 mg | ORAL_TABLET | Freq: Three times a day (TID) | ORAL | 11 refills | Status: DC
  Filled 2022-11-16: qty 90, 30d supply, fill #0

## 2022-11-16 MED ORDER — LOSARTAN POTASSIUM 50 MG PO TABS
50.0000 mg | ORAL_TABLET | Freq: Every day | ORAL | 11 refills | Status: DC
  Filled 2022-11-16: qty 30, 30d supply, fill #0

## 2022-11-16 NOTE — Progress Notes (Signed)
No chief complaint on file.   Subjective:  52 y.o. male with PMHx of diabetes mellitus recent kidney and pancreas transplant recipient presenting today for recurrence of an ulcer that developed to the patient's left great toe.  He does have a history of ulcers to the bilateral feet.  Patient states that the ulcer developed a few months ago.  He has been dressing the wound on his own.  He currently has an appointment set up with the [place] [facility] on 12/02/2022.   Past Medical History:  Diagnosis Date   Cellulitis    Dialysis patient [facility])    M, W, F   DKA (diabetic ketoacidosis) ([place])    Esophageal dysphagia    ESRD (end stage renal disease) ([place])    Gastroparesis    Herniated cervical disc    HTN (hypertension)    Hypercholesteremia    Hypothyroidism    Morbid obesity ([facility])    OSA (obstructive sleep apnea)    Peritoneal dialysis status ([facility])    PONV (postoperative nausea and vomiting)    after peritoneal dialysis catheter was placed   Presence of insulin pump    a.) parenteral insulin admin with pen as of 09/20/2022; no using pump   S/P cardiac cath    a. 10-15 yrs ago at [facility] due to tachycardia, reportedly normal. b. Normal ETT 03/2012.   Sepsis ([place])    Sinus tachycardia    a. 24-hr Holter 03/2012 - SR, occ PVCs, no VT, avg HR 96bpm.   TIA (transient ischemic attack)    Type 1 diabetes mellitus [facility])     Past Surgical History:  Procedure Laterality Date   A/V FISTULAGRAM Left 09/14/2022   Procedure: A/V Fistulagram;  Surgeon: Katha Cabal, MD;  Location: [place] [facility];  Service: Cardiovascular;  Laterality: Left;   AV FISTULA PLACEMENT Left 06/30/2022   Procedure: ARTERIOVENOUS (AV) FISTULA CREATION (BRACHIALCEPHALIC);  Surgeon: Katha Cabal, MD;  Location: [facility];  Service: Vascular;  Laterality: Left;   BIOPSY  10/03/2019   Procedure: BIOPSY;  Surgeon: Mauri Pole, MD;  Location: [facility];  Service: Endoscopy;;   CATARACT  EXTRACTION Bilateral    cervical neck fusion     COLONOSCOPY WITH PROPOFOL N/A 01/02/2021   Procedure: COLONOSCOPY WITH PROPOFOL;  Surgeon: [name], MD;  Location: [place];  Service: Endoscopy;  Laterality: N/A;  priority 4 DIABETIC needs potassium draw   ESOPHAGOGASTRODUODENOSCOPY (EGD) WITH PROPOFOL N/A 10/03/2019   Procedure: ESOPHAGOGASTRODUODENOSCOPY (EGD) WITH PROPOFOL;  Surgeon: Mauri Pole, MD;  Location: [facility];  Service: Endoscopy;  Laterality: N/A;   EYE SURGERY Bilateral    for floaters   HERNIA REPAIR     bilateral   LEFT HEART CATH AND CORONARY ANGIOGRAPHY Left 10/16/2021   Procedure: LEFT HEART CATH AND CORONARY ANGIOGRAPHY;  Surgeon: Wellington Hampshire, MD;  Location: [place] [facility];  Service: Cardiovascular;  Laterality: Left;   LEFT HEART CATHETERIZATION WITH CORONARY ANGIOGRAM N/A 12/18/2013   Procedure: LEFT HEART CATHETERIZATION WITH CORONARY ANGIOGRAM;  Surgeon: [name] M Martinique, MD;  Location: [facility];  Service: Cardiovascular;  Laterality: N/A;   PERITONEAL CATHETER INSERTION     PERITONEAL CATHETER REMOVAL     POLYPECTOMY  01/02/2021   Procedure: POLYPECTOMY;  Surgeon: [name], MD;  Location: [place];  Service: Endoscopy;;   REVISON OF ARTERIOVENOUS FISTULA Left 09/24/2022   Procedure: REVISON OF ARTERIOVENOUS FISTULA (BRACHIAL CEPHALIC WITH ARTEGRAFT);  Surgeon: Katha Cabal,  MD;  Location: [facility];  Service: Vascular;  Laterality: Left;    Allergies  Allergen Reactions   Sulfa Antibiotics Anaphylaxis and Swelling    Pt is not allergic to iodine, has had iodine in the past w/o premeds and w/ no problems   Oxycontin [Oxycodone Hcl] Nausea And Vomiting   Penicillins Other (See Comments)    Tolerated 1st generation cephalosporin (CEFAZOLIN) on 05/29/2020 & 06/30/2022 without documented ADRs.   Possible reaction many years ago per patient.  Has had PCN without issues since time of "reaction".     Prednisone Other (See Comments)    Patient is diabetic, runs sugar up     Shellfish-Derived Products Swelling    Eyes swell with shellfish Pt is not allergic to iodine, has had iodine in the past w/o premeds and w/ no problem   Pregabalin Other (See Comments)    Muscle twitching and jerks     Objective/Physical Exam General: The patient is alert and oriented x3 in no acute distress.  Dermatology:  Wound #1 noted to the plantar aspect of the left great toe approximately 1.0 x 1.0 x 0.5 cm (LxWxD).   To the noted ulceration(s), there is no eschar.  Moderate slough fibrin and necrotic tissue noted.  Granulation tissue in wound base is red after debridement.  Serosanguineous drainage.  The wound is very deep and close to the underlying bone.  Clinically the wound appears stable with no acute inflammatory cellulitis or infection at the moment.  Vascular: Palpable pedal pulses bilaterally. No edema or erythema noted. Capillary refill within normal limits.  Neurological: Light touch and protective threshold diminished bilaterally.   Musculoskeletal Exam: Range of motion within normal limits to all pedal and ankle joints bilateral. Muscle strength 5/5 in all groups bilateral.   Radiographic exam LT foot 11/16/2022: X-rays were taken of the left foot but unfortunately the x-ray machine/computer was closed prior to x-rays transferring to the Epic system and I was unable to review the x-rays today. Patient left prior to reviewing xrays.   Assessment: 1. Ulcer left great toe secondary to diabetes mellitus 2. diabetes mellitus w/ peripheral neuropathy   Plan of Care:  1. Patient was evaluated. 2. medically necessary excisional debridement including subcutaneous tissue was performed using a tissue nipper and a chisel blade. Excisional debridement of all the necrotic nonviable tissue down to healthy bleeding viable tissue was performed with post-debridement measurements same as pre-. 3. the wound  was cleansed and dry sterile dressing applied. 4. Patient has collagen dressings at home. Resume 5. CAM boot dispensed. WBAT 6. Patient has appt with [facility] 12/02/2022 7. RTC PRN   Edrick Kins, DPM [facility]  Dr. Edrick Kins, DPM    [address], [place] [address]                Office [phone]  Fax [phone]

## 2022-11-17 ENCOUNTER — Ambulatory Visit: Payer: Medicare Other

## 2022-11-17 DIAGNOSIS — L97512 Non-pressure chronic ulcer of other part of right foot with fat layer exposed: Secondary | ICD-10-CM

## 2022-11-17 NOTE — Addendum Note (Signed)
Addended by: Isidore Moos A on: 11/17/2022 09:21 AM   Modules accepted: Orders

## 2022-11-18 ENCOUNTER — Encounter: Payer: Medicare Other | Attending: Physician Assistant | Admitting: Physician Assistant

## 2022-11-18 DIAGNOSIS — E1122 Type 2 diabetes mellitus with diabetic chronic kidney disease: Secondary | ICD-10-CM | POA: Insufficient documentation

## 2022-11-18 DIAGNOSIS — Z992 Dependence on renal dialysis: Secondary | ICD-10-CM | POA: Insufficient documentation

## 2022-11-18 DIAGNOSIS — Z9483 Pancreas transplant status: Secondary | ICD-10-CM | POA: Diagnosis not present

## 2022-11-18 DIAGNOSIS — N186 End stage renal disease: Secondary | ICD-10-CM | POA: Diagnosis not present

## 2022-11-18 DIAGNOSIS — E11621 Type 2 diabetes mellitus with foot ulcer: Secondary | ICD-10-CM | POA: Insufficient documentation

## 2022-11-18 DIAGNOSIS — L97522 Non-pressure chronic ulcer of other part of left foot with fat layer exposed: Secondary | ICD-10-CM | POA: Diagnosis not present

## 2022-11-18 DIAGNOSIS — E1142 Type 2 diabetes mellitus with diabetic polyneuropathy: Secondary | ICD-10-CM | POA: Diagnosis not present

## 2022-11-18 DIAGNOSIS — I2510 Atherosclerotic heart disease of native coronary artery without angina pectoris: Secondary | ICD-10-CM | POA: Insufficient documentation

## 2022-11-18 DIAGNOSIS — I120 Hypertensive chronic kidney disease with stage 5 chronic kidney disease or end stage renal disease: Secondary | ICD-10-CM | POA: Diagnosis not present

## 2022-11-18 DIAGNOSIS — G4730 Sleep apnea, unspecified: Secondary | ICD-10-CM | POA: Diagnosis not present

## 2022-11-18 DIAGNOSIS — Z940 Kidney transplant status: Secondary | ICD-10-CM | POA: Insufficient documentation

## 2022-11-20 NOTE — Progress Notes (Signed)
[name], [name] ([id number]) [id number] Page 1 of 9 Visit Report for 11/18/2022 Chief Complaint Document Details Patient Name: Date of Service: Matthew Drema [place] [name]. 11/18/2022 10:30 A M Medical Record Number: [id number] Patient Account Number: [id number] Date of Birth/Sex: Treating RN: 1971/02/06 (52 y.o. [name] Primary Care Provider: Harrel [name] Other Clinician: Referring Provider: Treating Provider/Extender: [name] in Treatment: 0 Information Obtained from: Patient Chief Complaint Left Great T Ulcer oe Electronic Signature(s) Signed: 11/18/2022 11:15:53 AM By: Matthew Keeler PA-C Entered By: [name] on 11/18/2022 11:15:53 -------------------------------------------------------------------------------- HPI Details Patient Name: Date of Service: [name], [name]. 11/18/2022 10:30 A M Medical Record Number: [id number] Patient Account Number: [id number] Date of Birth/Sex: Treating RN: 05/22/1971 (53 y.o. [name] Primary Care Provider: Harrel [name] Other Clinician: Referring Provider: Treating Provider/Extender: [name] in Treatment: 0 History of Present Illness HPI Description: 01-08-2022 upon evaluation today patient appears to be doing somewhat poorly here in regard to multiple wounds on his right foot. He has a wound on the fifth toe, third toe, and first toe. The first and fifth toes are the worst the third toe is actually fairly superficial I do not think is can take much to get this to heal. With that being said unfortunately these are burned locations. The patient tells me that he was actually burning stuff out of his yard when this occurred unfortunately. He just was not paying attention he has neuropathy and unfortunately things got out of control. He does have a history of diabetes and that is where the neuropathy comes from. He also has end-stage renal disease he  is on peritoneal dialysis. He is actually on the kidney transplant list and that is the biggest concern since this happened he is actually been contacted for kidney transplant however he was turned down due to the fact that he had open wounds on his foot. Subsequently he has to get this healed in order to get back on the transplant list. He is obviously and understandably extremely frustrated as obviously peritoneal dialysis is a significant issue and very time intensive as well as obviously affecting his daily activities. He has been on Cipro from [facility] Dr. Amalia [name] for about 10 days. Has been using Betadine and dry [name] dressings at this point try to keep it as dry as possible. Honestly I think we need to get a lot of this eschar off. He does have a hemoglobin A1c most recently of 8.2. This actually occurred on March 4 1 month ago. 01-18-2022 upon evaluation today patient appears to be doing okay in regard to his wounds. Fortunately I do not see any signs of infection or inflammation at this point which is great news. Fortunately I think that he is showing some signs of improvement though he did get a little worried and that the wounds look kind of moist and he was afraid of infection so he went to dressing them so that be more dry. Obviously that may have slowed things down just a little bit here but I completely understand his concern. 01-26-2022 upon evaluation today patient appears to be doing well with regard to his toes I do feel like the middle toe on the right foot is healed. I feel like the SKIPPER, MACHIN ([id number]) [id number] Page 2 of 9 fifth toe is causing some issues here with regard to this looking like there is some pressure getting to the area. This  is definitely not what we want to see. Subsequently I discussed with the patient that he probably needs to look for some sandals something that would not put pressure on this toe at all to try  to prevent this from continuing to be an ongoing issue. He voiced understanding he states he may have something he needs to look when he gets home. 02-02-2022 upon evaluation today patient appears to be doing well with regard to his wound. He has been tolerating the dressing changes without complication. Both the great toe and the fifth toe area seems to be doing awesome. There does not appear to be any evidence of active infection locally or systemically at this time which is great news. No fevers, chills, nausea, vomiting, or diarrhea. 02-09-2022 upon evaluation today patient actually appears to be making excellent progress here. I am very pleased with where we stand. I do not see any evidence of active infection locally or systemically which is great news. 02-16-2022 upon evaluation today patient is actually making excellent progress in regard to his wounds. In fact the great toe is healed this is also news. His third toe appears to be doing great and the fifth toe is significantly smaller overall I am very pleased with where things stand at this point. I do not see any evidence of active infection locally or systemically at this time. 02-23-2022 upon evaluation today patient actually appears to be doing awesome in regard to the wound. The last remaining area is actually on his fifth toe and this is significantly improved compared to last week. In fact the overall improvement is roughly 90% compared to last week we are out a very small wound at this time. It is still open but nonetheless I think were probably within 1 maybe 2 [name] at the most of complete closure. It is also possible that it might be even done just come next week even. Nonetheless I do believe that he is on the right track and I think that we are doing quite well here. 03-02-2022 upon evaluation today patient's wound on the right foot actually appears to likely be completely closed. Unfortunately he has a plantar great toe ulcer on the  left foot with significant callus buildup this can need to be worked on today this open since he was last here. With that being said he does note that this has happened over the past 4 years intermittently generally it would heal up but just seems to keep coming back they tried to shave down the bone in that area and they have even tried to give him inserts nothing seems to work. 03-09-2022 upon evaluation today patient unfortunately has a blood blister on the tip of his right great toe. He tells me he "does not know where this came from". With that being said he also still have the plantar aspect of the left great toe ulcer which is doing much better its about half the size of what we measured last week. 05-17-2022 upon evaluation today patient actually appears to be likely completely healed. I do not see any signs of infection at this point which is great news. He was in the hospital for quite some time due to peritonitis he is doing better in that regard now. Readmission: 11-18-2022 this is a gentleman whom I have previously seen although his last visit was actually August 2023. 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 He is  actually on antirejection medications at this point and overall looked much healthier and much happier compared to last time I saw him. Unfortunately he does have a wound on the left great toe he tells me that he is to get this taken care of which I completely agree with. With that being said I am not sure whether he ever had vascular testing or intervention previous and I think we probably need to send him for evaluation at this point. He has medical history other than the pancreas and kidney transplant really has not changed although he tells me his diabetes is really under really good control at this point. He still does have neuropathy, history of coronary artery disease, and otherwise seems to be doing really  well in my opinion. Electronic Signature(s) Signed: 11/18/2022 5:36:06 PM By: Matthew Keeler PA-C Entered By: [name] on 11/18/2022 17:36:06 -------------------------------------------------------------------------------- Physical Exam Details Patient Name: Date of Service: Matthew Drema [place] [name]. 11/18/2022 10:30 A M Medical Record Number: [id number] Patient Account Number: [id number] Date of Birth/Sex: Treating RN: 12-07-1970 (52 y.o. [name] Primary Care Provider: Harrel [name] Other Clinician: Referring Provider: Treating Provider/Extender: [name] in Treatment: 0 Constitutional sitting or standing blood pressure is within target range for patient.. pulse regular and within target range for patient.Marland Kitchen respirations regular, non-labored and within target range for patient.Marland Kitchen temperature within target range for patient.. Well-nourished and well-hydrated in no acute distress. Eyes conjunctiva clear no eyelid edema noted. pupils equal round and reactive to light and accommodation. Ears, Nose, Mouth, and Throat no gross abnormality of ear auricles or external auditory canals. normal hearing noted during conversation. mucus membranes moist. Respiratory normal breathing without difficulty. [name], [name] ([id number]) [id number] Page 3 of 9 Musculoskeletal normal gait and posture. no significant deformity or arthritic changes, no loss or range of motion, no clubbing. Psychiatric this patient is able to make decisions and demonstrates good insight into disease process. Alert and Oriented x 3. pleasant and cooperative. Notes Upon inspection patient's wound bed actually showed signs of good granulation and epithelization at this point. Fortunately I do not see any evidence of infection locally or systemically which is great news and overall I am extremely pleased with where we stand currently. No fevers, chills, nausea,  vomiting, or diarrhea. Electronic Signature(s) Signed: 11/18/2022 5:38:44 PM By: Matthew Keeler PA-C Entered By: [name] on 11/18/2022 17:38:44 -------------------------------------------------------------------------------- Physician Orders Details Patient Name: Date of Service: [name], [name]. 11/18/2022 10:30 A M Medical Record Number: [id number] Patient Account Number: [id number] Date of Birth/Sex: Treating RN: [birth year] (52 y.o. 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 Wound Treatment Wound #6 - T Great oe Wound Laterality: Left, Posterior Cleanser: Soap and Water 3 x Per Week/30 Days Discharge Instructions: Gently cleanse wound with antibacterial soap, rinse and pat dry prior to dressing wounds Prim Dressing: Prisma 4.34 (in) 3 x Per Week/30 Days ary Discharge Instructions: Moisten w/normal saline or sterile water; Cover wound as directed.  Do not remove from wound bed. Secondary Dressing: Conforming Guaze Roll-Small 3 x Per Week/30 Days Discharge Instructions: [place] as directed Services and Therapies Ankle Brachial Index (ABI) Electronic Signature(s) Signed: 11/18/2022 5:39:55 PM By: Matthew Keeler PA-C Signed: 11/19/2022 1:32:16 PM By: Rosalio Loud MSN RN CNS [address] D ([id number])  [phone].pdf Page 4 of 9 Entered By: Rosalio Loud on 11/18/2022 11:53:24 -------------------------------------------------------------------------------- Problem List Details Patient Name: Date of Service: Matthew Drema [place] [name]. 11/18/2022 10:30 A M Medical Record Number: [id number] Patient Account Number: [id number] Date of Birth/Sex: Treating RN: 1971/09/21 (52 y.o. [name] Primary Care Provider: Harrel [name] Other Clinician: Referring Provider: Treating Provider/Extender: [name] in Treatment: 0 Active Problems ICD-10 Encounter Code Description Active Date MDM Diagnosis E11.621 Type 2 diabetes mellitus with foot ulcer 11/18/2022 No Yes E11.43 Type 2 diabetes mellitus with diabetic autonomic (poly)neuropathy 11/18/2022 No Yes L97.522 Non-pressure chronic ulcer of other part of left foot with fat layer exposed 11/18/2022 No Yes I25.10 Atherosclerotic heart disease of native coronary artery without angina pectoris 11/18/2022 No Yes Z94.0 Kidney transplant status 11/18/2022 No Yes 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 11/18/2022 10:30 A M Medical Record Number: [id number] Patient Account Number: [id number] Date of Birth/Sex: Treating RN: 03-03-1971 (52 y.o. [name] Primary Care Provider: Harrel [name] Other Clinician: Referring Provider: Treating Provider/Extender: [name] in Treatment: 0 Subjective Chief Complaint Information obtained from Patient Left Great T Ulcer oe History of Present Illness (HPI) 01-08-2022 upon evaluation today patient appears to be doing somewhat poorly here in regard to multiple wounds on his right foot. He has a wound on the fifth toe, third toe, and first toe. The first and fifth toes are the worst the third toe is actually fairly superficial I do not think is can take much to get this to heal. With that being said unfortunately these are burned locations. The patient tells me that he was actually burning stuff out of his yard when this occurred unfortunately. He just was not paying attention he has neuropathy and unfortunately things got out of control. He does have a history of diabetes and that is where the neuropathy comes from. He also has end-stage renal disease he is on peritoneal dialysis. He is actually on the kidney transplant list and that is the biggest concern since this happened he is actually been contacted for kidney transplant however he was turned down due to the fact that he had open wounds on his foot. Subsequently he has to get this healed in order to get back on the transplant list. He is obviously and understandably extremely frustrated as obviously peritoneal dialysis is a significant issue and very time intensive as well as obviously affecting his daily activities. He has been on Cipro from [facility] Dr. Amalia [name] for about 10 days. Has been using Betadine and dry [name] dressings at this point try to keep it as dry as possible. Honestly I think we need to get a lot of this eschar off. He does have a hemoglobin  A1c most recently of 8.2. This actually occurred on March 4 1 month ago. 01-18-2022 upon evaluation today patient appears to be doing okay in regard to his wounds. Fortunately I do not see any signs of infection or inflammation at this point which is great news. Fortunately I think  that he is showing some signs of improvement though he did get a little worried and that the wounds look kind of moist and he was afraid of infection so he went to dressing them so that be more dry. Obviously that may have slowed things down just a little bit here but I completely understand his concern. 01-26-2022 upon evaluation today patient appears to be doing well with regard to his toes I do feel like the middle toe on the right foot is healed. I feel like the fifth toe is causing some issues here with regard to this looking like there is some pressure getting to the area. This is definitely not what we want to see. Subsequently I discussed with the patient that he probably needs to look for some sandals something that would not put pressure on this toe at all to try to prevent this from continuing to be an ongoing issue. He voiced understanding he states he may have something he needs to look when he gets home. 02-02-2022 upon evaluation today patient appears to be doing well with regard to his wound. He has been tolerating the dressing changes without complication. Both the great toe and the fifth toe area seems to be doing awesome. There does not appear to be any evidence of active infection locally or systemically at this time which is great news. No fevers, chills, nausea, vomiting, or diarrhea. 02-09-2022 upon evaluation today patient actually appears to be making excellent progress here. I am very pleased with where we stand. I do not see any evidence of active infection locally or systemically which is great news. 02-16-2022 upon evaluation today patient is actually making excellent progress in regard to his wounds. In fact the great toe is healed this is also news. His third toe appears to be doing great and the fifth toe is significantly smaller overall I am very pleased with where things stand at this point. I do not see any evidence of active infection locally or systemically at this  time. 02-23-2022 upon evaluation today patient actually appears to be doing awesome in regard to the wound. The last remaining area is actually on his fifth toe and this is significantly improved compared to last week. In fact the overall improvement is roughly 90% compared to last week we are out a very small wound at this time. It is still open but nonetheless I think were probably within 1 maybe 2 [name] at the most of complete closure. It is also possible that it might be even done just come next week even. Nonetheless I do believe that he is on the right track and I think that we are doing quite well here. 03-02-2022 upon evaluation today patient's wound on the right foot actually appears to likely be completely closed. Unfortunately he has a plantar great toe ulcer on the left foot with significant callus buildup this can need to be worked on today this open since he was last here. With that being said he does note that this has happened over the past 4 years intermittently generally it would heal up but just seems to keep coming back they tried to shave down the bone in that area and  they have even tried to give him inserts nothing seems to work. 03-09-2022 upon evaluation today patient unfortunately has a blood blister on the tip of his right great toe. He tells me he "does not know where this came from". With that being said he also still have the plantar aspect of the left great toe ulcer which is doing much better its about half the size of what we measured last week. 05-17-2022 upon evaluation today patient actually appears to be likely completely healed. I do not see any signs of infection at this point which is great news. He was in the hospital for quite some time due to peritonitis he is doing better in that regard now. Readmission: 11-18-2022 this is a gentleman whom I have previously seen although his last visit was actually August 2023. Since I last saw him he actually did have a kidney  and pancreas transplant and is actually doing much better. He is actually on antirejection medications at this point and overall looked much healthier and much happier compared to last time I saw him. Unfortunately he does have a wound on the left great toe he tells me that he is to get this taken care of which I completely agree with. With that being said I am not sure whether he ever had vascular testing or intervention previous and I think we probably need to send him for evaluation at this point. He has medical history other than the pancreas and kidney transplant really has not changed although he tells me his diabetes is really under really good control at this point. He still does have neuropathy, history of coronary artery disease, and otherwise seems to be doing really well in my opinion. [name], [name] ([id number]) [id number] Page 6 of 9 Patient History Information obtained from Patient. Allergies OxyContin, prednisone (Reaction: hyperglycemia), Lyrica (Severity: Severe), Shellfish Containing Products Social History Never smoker, Marital Status - Married, Alcohol Use - Rarely, Drug Use - No History, Caffeine Use - Moderate. Medical History Respiratory Patient has history of Sleep Apnea Cardiovascular Patient has history of Coronary Artery Disease, Hypertension Endocrine Patient has history of Type I Diabetes Genitourinary Patient has history of End Stage Renal Disease Musculoskeletal Patient has history of Osteoarthritis Neurologic Patient has history of Neuropathy Objective Constitutional sitting or standing blood pressure is within target range for patient.. pulse regular and within target range for patient.Marland Kitchen respirations regular, non-labored and within target range for patient.Marland Kitchen temperature within target range for patient.. Well-nourished and well-hydrated in no acute distress. Vitals Time Taken: 10:54 AM, Height: 71 in, Source: Stated,  Weight: 209 lbs, Source: Stated, BMI: 29.1, Temperature: 97.9 F, Pulse: 75 bpm, Respiratory Rate: 16 breaths/min, Blood Pressure: 144/64 mmHg. Eyes conjunctiva clear no eyelid edema noted. pupils equal round and reactive to light and accommodation. Ears, Nose, Mouth, and Throat no gross abnormality of ear auricles or external auditory canals. normal hearing noted during conversation. mucus membranes moist. Respiratory normal breathing without difficulty. Musculoskeletal normal gait and posture. no significant deformity or arthritic changes, no loss or range of motion, no clubbing. Psychiatric this patient is able to make decisions and demonstrates good insight into disease process. Alert and Oriented x 3. pleasant and cooperative. General Notes: Upon inspection patient's wound bed actually showed signs of good granulation and epithelization at this point. Fortunately I do not see any evidence of infection locally or systemically which is great news and overall I am extremely pleased with where we stand currently. No fevers, chills, nausea, vomiting,  or diarrhea. Integumentary (Hair, Skin) Wound #6 status is Open. Original cause of wound was Hematoma. The date acquired was: 08/18/2022. The wound is located on the [place]. oe The wound measures 0.8cm length x 0.7cm width x 0.6cm depth; 0.44cm^2 area and 0.264cm^3 volume. There is Fat Layer (Subcutaneous Tissue) exposed. There is a medium amount of serosanguineous drainage noted. There is medium (34-66%) red, pink granulation within the wound bed. There is a medium (34- 66%) amount of necrotic tissue within the wound bed including Adherent Slough. 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 Services and Therapies ordered were: Ankle Brachial Index (ABI) WOUND #6: - T Great Wound Laterality: Left, Posterior oe Cleanser: Soap and Water 3 x Per Week/30 Days Discharge Instructions: Gently cleanse wound with antibacterial soap, rinse and pat dry prior to dressing wounds Prim Dressing: Prisma 4.34 (in) 3 x Per Week/30 Days ary Discharge Instructions: Moisten w/normal saline or sterile water; Cover wound as directed. Do not remove from wound bed. Secondary Dressing: Conforming Guaze Roll-Small 3 x Per Week/30 Days Discharge Instructions: Apply Conforming Stretch Guaze Bandage as directed 1. Based on what I am seeing I do believe that the patient would benefit from sending him for evaluation with vascular to ensure that he has good arterial flow. I think this is to be of utmost importance I discussed that with the patient today as well. 2. I am also can recommend that the patient should continue with the silver collagen which I think is a good option for the wound currently. 3. I would also recommend that he continue to monitor for any signs of infection right now I do not see anything and I do not know that he needs to be on doxycycline currently that that could be considered depending on how things proceed. We will see patient back for reevaluation in 1 week here in the clinic. If anything worsens or changes patient will contact our office for additional recommendations. Electronic Signature(s) Signed: 11/18/2022 5:39:24 PM By: Matthew Keeler PA-C Entered By: [name] on 11/18/2022 17:39:24 -------------------------------------------------------------------------------- ROS/PFSH Details Patient Name: Date of Service: [name], [name]. 11/18/2022 10:30 A  M Medical Record Number: [id number] Patient Account Number: [id number] Date of Birth/Sex: Treating RN: Apr 05, 1971 (52 y.o. [name] Primary Care Provider: Harrel [name] Other Clinician: Referring Provider: Treating Provider/Extender: [name] in Treatment: 0 Information Obtained From Patient Respiratory Medical History: Positive for: Sleep Apnea Cardiovascular Medical History: Positive for: Coronary Artery Disease; Hypertension Endocrine Medical History: Positive for: Type I Diabetes Time with diabetes: 28 years Treated with: Insulin Blood sugar tested every day: Yes Tested : Genitourinary Medical HistoryLAVERL, ANGERS ([id number]) [id number] Page 8 of 9 Positive for: End Stage Renal Disease Musculoskeletal Medical History: Positive for: Osteoarthritis Neurologic Medical History: Positive for: Neuropathy Immunizations Pneumococcal Vaccine: Received Pneumococcal Vaccination: Yes Received Pneumococcal Vaccination On or After 60th Birthday: No Implantable Devices None Family and Social History Never smoker; Marital Status - Married; Alcohol Use: Rarely; Drug Use: No History; Caffeine Use: Moderate Electronic Signature(s) Signed: 11/18/2022 5:39:55 PM By: Matthew Keeler PA-C Signed: 11/19/2022 1:32:16 PM By: Tamala Julian,  Jocelyn Lamer MSN RN CNS WTA Entered By: Rosalio Loud on 11/18/2022 11:07:10 -------------------------------------------------------------------------------- SuperBill Details Patient Name: Date of Service: Matthew Drema [place] [name]. 11/18/2022 Medical Record Number: [id number] Patient Account Number: [id number] Date of Birth/Sex: Treating RN: 1970/12/28 (52 y.o. 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 Facility Procedures : CPT4 Code: [id number] Description: 99213 - WOUND CARE VISIT-LEV 3 EST PT Modifier: Quantity: 1 Physician Procedures : CPT4 Code Description Modifier [id number] - WC PHYS LEVEL 4 - EST PT ICD-10 Diagnosis Description [name], [name] ([id number]) [id number] E11.621 Type 2 diabetes mellitus with foot ulcer E11.43 Type 2 diabetes mellitus with  diabetic autonomic (poly)neuropathy L97.522 Non-pressure chronic ulcer of other part of left foot with fat layer exposed I25.10 Atherosclerotic heart disease of native coronary artery without angina pectoris Quantity: 1 [id number] Page 9 of 9 Electronic Signature(s) Signed: 11/18/2022 5:39:37 PM By: Matthew Keeler PA-C Entered By: [name] on 11/18/2022 17:39:37

## 2022-11-20 NOTE — Progress Notes (Signed)
JUBEI, HERWICK ([id number]) [phone].pdf Page 1 of 10 Visit Report for 11/18/2022 Allergy List Details Patient Name: Date of Service: Matthew Drema Dallas [name]. 11/18/2022 10:30 A M Medical Record Number: [id number] Patient Account Number: [id number] Date of Birth/Sex: Treating RN: 10/06/1970 (52 y.o. [name] Primary Care Daphnie Venturini: Harrel Lemon Other Clinician: Referring Virgil Slinger: Treating Ercil Cassis/Extender: Griselda Miner Weeks in Treatment: 0 Allergies Active Allergies OxyContin prednisone Reaction: hyperglycemia Lyrica Severity: Severe Shellfish Containing Products Allergy Notes Electronic Signature(s) Signed: 11/19/2022 1:32:16 PM By: Rosalio Loud MSN RN CNS WTA Entered By: Rosalio Loud on 11/18/2022 11:06:31 -------------------------------------------------------------------------------- Arrival Information Details Patient Name: Date of Service: [name], Matthew PHER [name]. 11/18/2022 10:30 A M Medical Record Number: [id number] Patient Account Number: [id number] Date of Birth/Sex: Treating RN: [birth year] (52 y.o. [name] Primary Care Rozena Fierro: Harrel Lemon Other Clinician: Referring Aerik Polan: Treating Jisell Majer/Extender: Primitivo Gauze in Treatment: 0 Visit Information Patient Arrived: Ambulatory Arrival Time: 10:53 Accompanied By: self Transfer Assistance: None Patient Identification Verified: Yes Secondary Verification Process Completed: Yes Patient Requires Transmission-Based No [name], [name] ([id number]) Patient Requires Transmission-Based No Precautions: Patient Has Alerts: Yes Patient Alerts: Kidney and pancreas tr History Since Last Visit Added or deleted any medications: No Any new allergies or adverse reactions: No Had a fall or experienced change in activities of daily living that may affect risk of falls: No Hospitalized since last visit: No Has Dressing in Place as Prescribed: Yes  [id number] Page 2 of 10 Pain Present Now: No anspl Electronic Signature(s) Signed: 11/19/2022 1:32:16 PM By: Rosalio Loud MSN RN CNS WTA Entered By: Rosalio Loud on 11/18/2022 10:54:14 -------------------------------------------------------------------------------- Clinic Level of Care Assessment Details Patient Name: Date of Service: Matthew Drema Dallas [name]. 11/18/2022 10:30 A M Medical Record Number: [id number] Patient Account Number: [id number] Date of Birth/Sex: Treating RN: 06-02-1971 (52 y.o. [name] Primary Care Sydny Schnitzler: Harrel Lemon Other Clinician: Referring Jennessa Trigo: Treating Ronda Kazmi/Extender: Primitivo Gauze in Treatment: 0 Clinic Level of Care Assessment Items TOOL 2 Quantity Score X- 1 0 Use when only an EandM is performed on the INITIAL visit ASSESSMENTS - Nursing Assessment / Reassessment X- 1 20 General Physical Exam (combine w/ comprehensive assessment (listed just below) when performed on new pt. evals) X- 1 25 Comprehensive Assessment (HX, ROS, Risk Assessments, Wounds Hx, etc.) ASSESSMENTS - Wound and Skin A ssessment / Reassessment X - Simple Wound Assessment / Reassessment - one wound 1 5 []$  - 0 Complex Wound Assessment / Reassessment - multiple wounds []$  - 0 Dermatologic / Skin Assessment (not related to wound area) ASSESSMENTS - Ostomy and/or Continence Assessment and Care []$  - 0 Incontinence Assessment and Management []$  - 0 Ostomy Care Assessment and Management (repouching, etc.) PROCESS - Coordination of Care X - Simple Patient / Family Education for ongoing care 1 15 []$  - 0 Complex (extensive) Patient / Family Education for ongoing care []$  - 0 Staff obtains Programmer, systems, Records, T Results / Process Orders est []$  - 0 Staff telephones HHA, Nursing Homes / Clarify orders / etc []$  - 0 Routine Transfer to another Facility (non-emergent condition) []$  - 0 Routine Hospital Admission (non-emergent  condition) []$  - 0 New Admissions / Biomedical engineer / Ordering NPWT Apligraf, etc. , []$  - 0 Emergency Hospital Admission (emergent condition) X- 1 10 Simple Discharge Coordination []$  - 0 Complex (extensive) Discharge Coordination PROCESS - Special Needs []$  - 0 Pediatric / Minor Patient Management []$  - 0 Isolation Patient Management [name],  [name] ([id number]) [id number] Page 3 of 10 []$  - 0 Hearing / Language / Visual special needs []$  - 0 Assessment of Community assistance (transportation, [name]/C planning, etc.) []$  - 0 Additional assistance / Altered mentation []$  - 0 Support Surface(s) Assessment (bed, cushion, seat, etc.) INTERVENTIONS - Wound Cleansing / Measurement X- 1 5 Wound Imaging (photographs - any number of wounds) []$  - 0 Wound Tracing (instead of photographs) X- 1 5 Simple Wound Measurement - one wound []$  - 0 Complex Wound Measurement - multiple wounds X- 1 5 Simple Wound Cleansing - one wound []$  - 0 Complex Wound Cleansing - multiple wounds INTERVENTIONS - Wound Dressings X - Small Wound Dressing one or multiple wounds 1 10 []$  - 0 Medium Wound Dressing one or multiple wounds []$  - 0 Large Wound Dressing one or multiple wounds []$  - 0 Application of Medications - injection INTERVENTIONS - Miscellaneous []$  - 0 External ear exam []$  - 0 Specimen Collection (cultures, biopsies, blood, body fluids, etc.) []$  - 0 Specimen(s) / Culture(s) sent or taken to Lab for analysis []$  - 0 Patient Transfer (multiple staff / Civil Service fast streamer / Similar devices) []$  - 0 Simple Staple / Suture removal (25 or less) []$  - 0 Complex Staple / Suture removal (26 or more) []$  - 0 Hypo / Hyperglycemic Management (close monitor of Blood Glucose) X- 1 15 Ankle / Brachial Index (ABI) - do not check if billed separately Has the patient been seen at the hospital within the last three years: Yes Total Score: 115 Level Of Care: New/Established - Level  3 Electronic Signature(s) Signed: 11/19/2022 1:32:16 PM By: Rosalio Loud MSN RN CNS WTA Entered By: Rosalio Loud on 11/18/2022 11:53:29 -------------------------------------------------------------------------------- Encounter Discharge Information Details Patient Name: Date of Service: [name], Matthew PHER [name]. 11/18/2022 10:30 A M Medical Record Number: [id number] Patient Account Number: [id number] Date of Birth/Sex: Treating RN: 02-20-1971 (52 y.o. [name] Primary Care Skiler Olden: Harrel Lemon Other Clinician: Referring Reida Hem: Treating Talayla Doyel/Extender: Primitivo Gauze in Treatment: 0 Encounter Discharge Information Items Discharge Condition: [name], [name] ([id number]) ([id number]) [id number].pdf Page 4 of 10 Ambulatory Status: Ambulatory Discharge Destination: Home Transportation: Private Auto Accompanied By: self Schedule Follow-up Appointment: Yes Clinical Summary of Care: Electronic Signature(s) Signed: 11/19/2022 1:32:16 PM By: Rosalio Loud MSN RN CNS WTA Entered By: Rosalio Loud on 11/18/2022 11:56:17 -------------------------------------------------------------------------------- Lower Extremity Assessment Details Patient Name: Date of Service: Matthew Drema Dallas [name]. 11/18/2022 10:30 A M Medical Record Number: [id number] Patient Account Number: [id number] Date of Birth/Sex: Treating RN: [birth year] (52 y.o. [name] Primary Care Alexy Heldt: Harrel Lemon Other Clinician: Referring Quanisha Drewry: Treating Leahna Hewson/Extender: Griselda Miner Weeks in Treatment: 0 Edema Assessment Assessed: [Left: No] [Right: No] [Left: Edema] [Right: :] Calf Left: Right: Point of Measurement: 36 cm From Medial Instep 39 cm Ankle Left: Right: Point of Measurement: 12 cm From Medial Instep 24 cm Vascular Assessment Pulses: Dorsalis Pedis Palpable: [Left:Yes] Blood Pressure: Brachial: [Left:120] Ankle: [Left:Dorsalis Pedis:  180 1.50] Electronic Signature(s) Signed: 11/19/2022 1:32:16 PM By: Rosalio Loud MSN RN CNS WTA Entered By: Rosalio Loud on 11/18/2022 11:52:37 [name] ([id number]) [id number] Page 5 of 10 -------------------------------------------------------------------------------- Multi Wound Chart Details Patient Name: Date of Service: Matthew Drema Dallas [name]. 11/18/2022 10:30 A M Medical Record Number: [id number] Patient Account Number: [id number] Date of Birth/Sex: Treating RN: [birth year] (52 y.o. [name] Primary Care Franshesca Chipman: Harrel Lemon Other Clinician: Referring Bradden Tadros: Treating Taneeka Curtner/Extender: Griselda Miner Weeks in Treatment:  0 Vital Signs Height(in): 71 Pulse(bpm): 75 Weight(lbs): 209 Blood Pressure(mmHg): 144/64 Body Mass Index(BMI): 29.1 Temperature(F): 97.9 Respiratory Rate(breaths/min): 16 [6:Photos:] [N/A:N/A] Left, Posterior T Great oe N/A N/A Wound Location: Hematoma N/A N/A Wounding Event: Diabetic Wound/Ulcer of the Lower N/A N/A Primary Etiology: Extremity Sleep Apnea, Coronary Artery N/A N/A Comorbid History: Disease, Hypertension, Type I Diabetes, End Stage Renal Disease, Osteoarthritis, Neuropathy 08/18/2022 N/A N/A Date Acquired: 0 N/A N/A Weeks of Treatment: Open N/A N/A Wound Status: No N/A N/A Wound Recurrence: 0.8x0.7x0.6 N/A N/A Measurements L x W x [name] (cm) 0.44 N/A N/A A (cm) : rea 0.264 N/A N/A Volume (cm) : Grade 3 N/A N/A Classification: Medium N/A N/A Exudate A mount: Serosanguineous N/A N/A Exudate Type: red, brown N/A N/A Exudate Color: Medium (34-66%) N/A N/A Granulation A mount: Red, Pink N/A N/A Granulation Quality: Medium (34-66%) N/A N/A Necrotic A mount: Fat Layer (Subcutaneous Tissue): Yes N/A N/A Exposed Structures: Fascia: No Tendon: No Muscle: No Joint: No Bone: No Treatment Notes Wound #6 (Toe Great) Wound Laterality: Left,  Posterior Cleanser Soap and Water Discharge Instruction: Gently cleanse wound with antibacterial soap, rinse and pat dry prior to dressing wounds Peri-Wound Care RYNA, SCARRY ([id number]) [id number] Page 6 of 10 Topical Primary Dressing Prisma 4.34 (in) Discharge Instruction: Moisten w/normal saline or sterile water; Cover wound as directed. Do not remove from wound bed. Secondary Dressing [place] Roll-Small Discharge Instruction: Apply Conforming Stretch Guaze Bandage as directed Secured With Compression Wrap Compression Stockings Add-Ons Electronic Signature(s) Signed: 11/19/2022 1:32:16 PM By: Rosalio Loud MSN RN CNS WTA Entered By: Rosalio Loud on 11/18/2022 11:55:45 -------------------------------------------------------------------------------- Multi-Disciplinary Care Plan Details Patient Name: Date of Service: [name], Joyice Faster [name]. 11/18/2022 10:30 A M Medical Record Number: [id number] Patient Account Number: [id number] Date of Birth/Sex: Treating RN: Jul 13, 1971 (52 y.o. [name] Primary Care Shadeed Colberg: Harrel Lemon Other Clinician: Referring Kemiyah Tarazon: Treating Evren Shankland/Extender: Griselda Miner Weeks in Treatment: 0 Active Inactive Necrotic Tissue Nursing Diagnoses: Impaired tissue integrity related to necrotic/devitalized tissue Knowledge deficit related to management of necrotic/devitalized tissue Goals: Necrotic/devitalized tissue will be minimized in the wound bed Date Initiated: 11/18/2022 Target Resolution Date: 12/17/2022 Goal Status: Active Patient/caregiver will verbalize understanding of reason and process for debridement of necrotic tissue Date Initiated: 11/18/2022 Target Resolution Date: 12/17/2022 Goal Status: Active Interventions: Assess patient pain level pre-, during and post procedure and prior to discharge Provide education on necrotic tissue and debridement process Treatment  Activities: T ordered outside of clinic : 11/18/2022 est Notes: Wound/Skin Impairment Nursing Diagnoses: Impaired tissue integrity [name], [name] ([id number]) [id number] Page 7 of 10 Knowledge deficit related to ulceration/compromised skin integrity Goals: Patient will demonstrate a reduced rate of smoking or cessation of smoking Date Initiated: 11/18/2022 Target Resolution Date: 12/16/2022 Goal Status: Active Patient will have a decrease in wound volume by X% from date: (specify in notes) Date Initiated: 11/18/2022 Target Resolution Date: 12/16/2022 Goal Status: Active Patient/caregiver will verbalize understanding of skin care regimen Date Initiated: 11/18/2022 Target Resolution Date: 11/24/2022 Goal Status: Active Ulcer/skin breakdown will have a volume reduction of 30% by week 4 Date Initiated: 11/18/2022 Target Resolution Date: 12/17/2022 Goal Status: Active Ulcer/skin breakdown will have a volume reduction of 50% by week 8 Date Initiated: 11/18/2022 Target Resolution Date: 01/17/2023 Goal Status: Active Interventions: Assess patient/caregiver ability to obtain necessary supplies Assess ulceration(s) every visit Provide education on ulcer and skin care Treatment Activities: Skin care regimen initiated : 11/18/2022 Notes: Electronic Signature(s) Signed: 11/19/2022 1:32:16 PM  By: Rosalio Loud MSN RN CNS WTA Entered By: Rosalio Loud on 11/18/2022 11:55:18 -------------------------------------------------------------------------------- Pain Assessment Details Patient Name: Date of Service: Matthew Drema Dallas [name]. 11/18/2022 10:30 A M Medical Record Number: [id number] Patient Account Number: [id number] Date of Birth/Sex: Treating RN: September 03, 1971 (52 y.o. [name] Primary Care Tameem Pullara: Harrel Lemon Other Clinician: Referring Jamori Biggar: Treating Severn Goddard/Extender: Griselda Miner Weeks in Treatment: 0 Active Problems Location of Pain  Severity and Description of Pain Patient Has Paino No Site Locations [name], [name] ([id number]) [phone].pdf Page 8 of 10 Pain Management and Medication Current Pain Management: Electronic Signature(s) Signed: 11/19/2022 1:32:16 PM By: Rosalio Loud MSN RN CNS WTA Entered By: Rosalio Loud on 11/18/2022 11:03:12 -------------------------------------------------------------------------------- Patient/Caregiver Education Details Patient Name: Date of Service: Matthew Sias [name]. 2/15/2024andnbsp10:30 [place] Record Number: [id number] Patient Account Number: [id number] Date of Birth/Gender: Treating RN: 10-01-1971 (52 y.o. [name] Primary Care Physician: Harrel Lemon Other Clinician: Referring Physician: Treating Physician/Extender: Primitivo Gauze in Treatment: 0 Education Assessment Education Provided To: Patient Education Topics Provided Wound/Skin Impairment: Handouts: Caring for Your Ulcer Methods: Explain/Verbal Responses: State content correctly Electronic Signature(s) Signed: 11/19/2022 1:32:16 PM By: Rosalio Loud MSN RN CNS WTA Entered By: Rosalio Loud on 11/18/2022 11:53:41 [name] ([id number]) [id number] Page 9 of 10 -------------------------------------------------------------------------------- Wound Assessment Details Patient Name: Date of Service: Matthew Drema Dallas [name]. 11/18/2022 10:30 A M Medical Record Number: [id number] Patient Account Number: [id number] Date of Birth/Sex: Treating RN: [birth year] (52 y.o. [name] Primary Care Ottie Tillery: Harrel Lemon Other Clinician: Referring Jazminn Pomales: Treating Yaretzi Ernandez/Extender: Griselda Miner Weeks in Treatment: 0 Wound Status Wound Number: 6 Primary Diabetic Wound/Ulcer of the Lower Extremity Etiology: Wound Location: Left, Posterior T Great oe Wound Open Wounding Event: Hematoma Status: Date  Acquired: 08/18/2022 Comorbid Sleep Apnea, Coronary Artery Disease, Hypertension, Type I Weeks Of Treatment: 0 History: Diabetes, End Stage Renal Disease, Osteoarthritis, Neuropathy Clustered Wound: No Photos Wound Measurements Length: (cm) Width: (cm) Depth: (cm) Area: (cm) Volume: (cm) 0.8 % Reduction in Area: 0.7 % Reduction in Volume: 0.6 0.44 0.264 Wound Description Classification: Grade 3 Exudate Amount: Medium Exudate Type: Serosanguineous Exudate Color: red, brown Foul Odor After Cleansing: No Slough/Fibrino No Wound Bed Granulation Amount: Medium (34-66%) Exposed Structure Granulation Quality: Red, Pink Fascia Exposed: No Necrotic Amount: Medium (34-66%) Fat Layer (Subcutaneous Tissue) Exposed: Yes Necrotic Quality: Adherent Slough Tendon Exposed: No Muscle Exposed: No Joint Exposed: No Bone Exposed: No Treatment Notes Wound #6 (Toe Great) Wound Laterality: Left, Posterior Cleanser Soap and Water Discharge Instruction: Gently cleanse wound with antibacterial soap, rinse and pat dry prior to dressing wounds Peri-Wound Care [name], [name] ([id number]) [id number] Page 10 of 10 Topical Primary Dressing Prisma 4.34 (in) Discharge Instruction: Moisten w/normal saline or sterile water; Cover wound as directed. Do not remove from wound bed. Secondary Dressing [place] Roll-Small Discharge Instruction: Apply Conforming Stretch Guaze Bandage as directed Secured With Compression Wrap Compression Stockings Add-Ons Electronic Signature(s) Signed: 11/19/2022 1:32:16 PM By: Rosalio Loud MSN RN CNS WTA Entered By: Rosalio Loud on 11/18/2022 11:11:09 -------------------------------------------------------------------------------- Vitals Details Patient Name: Date of Service: [name], Matthew PHER [name]. 11/18/2022 10:30 A M Medical Record Number: [id number] Patient Account Number: [id number] Date of Birth/Sex: Treating  RN: Jan 16, 1971 (52 y.o. [name] Primary Care Deandria Klute: Harrel Lemon Other Clinician: Referring Jaz Laningham: Treating Lavonn Maxcy/Extender: Griselda Miner Weeks in Treatment: 0 Vital Signs Time Taken: 10:54 Temperature (F): 97.9 Height (in):  71 Pulse (bpm): 75 Source: Stated Respiratory Rate (breaths/min): 16 Weight (lbs): 209 Blood Pressure (mmHg): 144/64 Source: Stated Reference Range: 80 - 120 mg / dl Body Mass Index (BMI): 29.1 Electronic Signature(s) Signed: 11/19/2022 1:32:16 PM By: Rosalio Loud MSN RN CNS WTA Entered By: Rosalio Loud on 11/18/2022 11:03:16

## 2022-11-20 NOTE — Progress Notes (Signed)
JALONI, KETTINGER ([id number]) [phone] [email or web address] Page 1 of 5 Visit Report for 11/18/2022 Abuse Risk Screen Details Patient Name: Date of Service: GO Drema [place] D. 11/18/2022 10:30 A M Medical Record Number: [id number] Patient Account Number: [id number] Date of Birth/Sex: Treating RN: [birth year] (52 y.o. [name] Primary Care Gwenette Wellons: Harrel Lemon Other Clinician: Referring Khaleah Duer: Treating Donyea Gafford/Extender: Griselda Miner Weeks in Treatment: 0 Abuse Risk Screen Items Answer Electronic Signature(s) Signed: 11/19/2022 1:32:16 PM By: Rosalio Loud MSN RN CNS WTA Entered By: Rosalio Loud on 11/18/2022 11:07:14 -------------------------------------------------------------------------------- Activities of Daily Living Details Patient Name: Date of Service: Leodis Sias D. 11/18/2022 10:30 A M Medical Record Number: [id number] Patient Account Number: [id number] Date of Birth/Sex: Treating RN: 05-09-1971 (52 y.o. [name] Primary Care Luzelena Heeg: Harrel Lemon Other Clinician: Referring Riley Papin: Treating Avyay Coger/Extender: Griselda Miner Weeks in Treatment: 0 Activities of Daily Living Items Answer Activities of Daily Living (Please select one for each item) Drive Automobile Completely Able T Medications ake Completely Able Use T elephone Completely Able Care for Appearance Completely Able Use T oilet Completely Able Bath / Shower Completely Able Dress Self Completely Able Feed Self Completely Able Walk Completely Able Get In / Out Bed Completely Able Housework Completely Able Prepare Meals Completely Able Handle Money Completely Able Shop for Self Completely AMARDEEP, ZERBA ([id number]) [phone] [id number] Page 2 of 5 Electronic Signature(s) Signed: 11/19/2022 1:32:16 PM By: Rosalio Loud MSN RN CNS WTA Entered By: Rosalio Loud on 11/18/2022  11:07:29 -------------------------------------------------------------------------------- Education Screening Details Patient Name: Date of Service: GO INS, CHRISTO PHER D. 11/18/2022 10:30 A M Medical Record Number: [id number] Patient Account Number: [id number] Date of Birth/Sex: Treating RN: 1971/04/07 (52 y.o. [name] Primary Care Anaisa Radi: Harrel Lemon Other Clinician: Referring Johnwilliam Shepperson: Treating Aeon Kessner/Extender: Primitivo Gauze in Treatment: 0 Learning Preferences/Education Level/Primary Language Learning Preference: Explanation, Demonstration Highest Education Level: High School Preferred Language: English Cognitive Barrier Language Barrier: No Translator Needed: No Memory Deficit: No Emotional Barrier: No Cultural/Religious Beliefs Affecting Medical Care: No Physical Barrier Impaired Vision: Yes Glasses Impaired Hearing: No Decreased Hand dexterity: No Knowledge/Comprehension Knowledge Level: High Comprehension Level: High Ability to understand written instructions: High Ability to understand verbal instructions: High Motivation Anxiety Level: Calm Cooperation: Cooperative Education Importance: Acknowledges Need Interest in Health Problems: Asks Questions Perception: Coherent Willingness to Engage in Self-Management High Activities: Readiness to Engage in Self-Management High Activities: Electronic Signature(s) Signed: 11/19/2022 1:32:16 PM By: Rosalio Loud MSN RN CNS WTA Entered By: Rosalio Loud on 11/18/2022 11:08:01 Talbert Nan D ([id number]) [id number] Page 3 of 5 -------------------------------------------------------------------------------- Fall Risk Assessment Details Patient Name: Date of Service: GO Drema [place] D. 11/18/2022 10:30 A M Medical Record Number: [id number] Patient Account Number: [id number] Date of Birth/Sex: Treating RN: December 02, 1970 (52 y.o. [name] Primary  Care Keishia Ground: Harrel Lemon Other Clinician: Referring Frayda Egley: Treating Edwardo Wojnarowski/Extender: Primitivo Gauze in Treatment: 0 Fall Risk Assessment Items Have you had 2 or more falls in the last 12 monthso 0 No Have you had any fall that resulted in injury in the last 12 monthso 0 No FALLS RISK SCREEN History of falling - immediate or within 3 months 0 No Secondary diagnosis (Do you have 2 or more medical diagnoseso) 0 No Ambulatory aid None/bed rest/wheelchair/nurse 0 No Crutches/cane/walker 0 No Furniture 0 No Intravenous therapy Access/Saline/Heparin Lock 0 No Gait/Transferring Normal/ bed rest/ wheelchair 0 No Weak (short steps with or without shuffle, stooped  but able to lift head while walking, may seek 0 No support from furniture) Impaired (short steps with shuffle, may have difficulty arising from chair, head down, impaired 0 No balance) Mental Status Oriented to own ability 0 Yes Electronic Signature(s) Signed: 11/19/2022 1:32:16 PM By: Rosalio Loud MSN RN CNS WTA Entered By: Rosalio Loud on 11/18/2022 11:08:15 -------------------------------------------------------------------------------- Foot Assessment Details Patient Name: Date of Service: Leodis Sias D. 11/18/2022 10:30 A M Medical Record Number: [id number] Patient Account Number: [id number] Date of Birth/Sex: Treating RN: [birth year] (52 y.o. [name] Primary Care Jennier Schissler: Harrel Lemon Other Clinician: Referring Jarrid Lienhard: Treating Kyli Sorter/Extender: Griselda Miner Weeks in Treatment: 0 Foot Assessment Items Site Locations KELVIN, LUNDE D ([id number]) [phone] [id number] Page 4 of 5 + = Sensation present, - = Sensation absent, C = Callus, U = Ulcer R = Redness, W = Warmth, M = Maceration, PU = Pre-ulcerative lesion F = Fissure, S = Swelling, D = Dryness Assessment Right: Left: Other Deformity: No No Prior Foot Ulcer: Yes No Prior  Amputation: No No Charcot Joint: No No Ambulatory Status: Ambulatory Without Help Gait: Steady Electronic Signature(s) Signed: 11/19/2022 1:32:16 PM By: Rosalio Loud MSN RN CNS WTA Entered By: Rosalio Loud on 11/18/2022 11:08:55 -------------------------------------------------------------------------------- Nutrition Risk Screening Details Patient Name: Date of Service: Leodis Sias D. 11/18/2022 10:30 A M Medical Record Number: [id number] Patient Account Number: [id number] Date of Birth/Sex: Treating RN: [birth year] (52 y.o. [name] Primary Care Sincere Berlanga: Harrel Lemon Other Clinician: Referring Aqueelah Cotrell: Treating Dionel Archey/Extender: Griselda Miner Weeks in Treatment: 0 Height (in): 71 Weight (lbs): 209 Body Mass Index (BMI): 29.1 Nutrition Risk Screening Items Score Screening NUTRITION RISK SCREEN: I have an illness or condition that made me change the kind and/or amount of food I eat 0 No I eat fewer than two meals per day 0 No I eat few fruits and vegetables, or milk products 0 No I have three or more drinks of beer, liquor or wine almost every day 0 No I have tooth or mouth problems that make it hard for me to eat 0 No I don't always have enough money to buy the food I need 0 No RUDDY, PACHON D ([id number]) [phone] [id number] Page 5 of 5 I eat alone most of the time 0 No I take three or more different prescribed or over-the-counter drugs a day 0 No Without wanting to, I have lost or gained 10 pounds in the last six months 0 No I am not always physically able to shop, cook and/or feed myself 0 No Nutrition Protocols Good Risk Protocol 0 No interventions needed Moderate Risk Protocol High Risk Proctocol Risk Level: Good Risk Score: 0 Electronic Signature(s) Signed: 11/19/2022 1:32:16 PM By: Rosalio Loud MSN RN CNS WTA Entered By: Rosalio Loud on 11/18/2022 11:08:25

## 2022-11-25 ENCOUNTER — Ambulatory Visit: Payer: Commercial Managed Care - PPO | Admitting: Internal Medicine

## 2022-11-25 ENCOUNTER — Ambulatory Visit
Admission: RE | Admit: 2022-11-25 | Discharge: 2022-11-25 | Disposition: A | Discharge status: Home / Self Care | Payer: Medicare Other | Source: Ambulatory Visit | Attending: Physician Assistant | Admitting: Physician Assistant

## 2022-11-25 ENCOUNTER — Other Ambulatory Visit: Payer: Self-pay

## 2022-11-25 ENCOUNTER — Encounter: Payer: Medicare Other | Admitting: Physician Assistant

## 2022-11-25 ENCOUNTER — Other Ambulatory Visit: Payer: Self-pay | Admitting: Physician Assistant

## 2022-11-25 ENCOUNTER — Other Ambulatory Visit
Admission: RE | Admit: 2022-11-25 | Discharge: 2022-11-25 | Disposition: A | Discharge status: Home / Self Care | Payer: Medicare Other | Source: Ambulatory Visit | Attending: Family Medicine | Admitting: Family Medicine

## 2022-11-25 DIAGNOSIS — Z9483 Pancreas transplant status: Secondary | ICD-10-CM | POA: Diagnosis not present

## 2022-11-25 DIAGNOSIS — B999 Unspecified infectious disease: Secondary | ICD-10-CM | POA: Diagnosis not present

## 2022-11-25 DIAGNOSIS — E1122 Type 2 diabetes mellitus with diabetic chronic kidney disease: Secondary | ICD-10-CM | POA: Diagnosis not present

## 2022-11-25 DIAGNOSIS — E11621 Type 2 diabetes mellitus with foot ulcer: Secondary | ICD-10-CM | POA: Diagnosis not present

## 2022-11-25 DIAGNOSIS — L089 Local infection of the skin and subcutaneous tissue, unspecified: Secondary | ICD-10-CM | POA: Insufficient documentation

## 2022-11-25 DIAGNOSIS — Z992 Dependence on renal dialysis: Secondary | ICD-10-CM | POA: Diagnosis not present

## 2022-11-25 DIAGNOSIS — E1142 Type 2 diabetes mellitus with diabetic polyneuropathy: Secondary | ICD-10-CM | POA: Diagnosis not present

## 2022-11-25 DIAGNOSIS — N186 End stage renal disease: Secondary | ICD-10-CM | POA: Diagnosis not present

## 2022-11-25 DIAGNOSIS — L97522 Non-pressure chronic ulcer of other part of left foot with fat layer exposed: Secondary | ICD-10-CM | POA: Diagnosis not present

## 2022-11-25 DIAGNOSIS — Z940 Kidney transplant status: Secondary | ICD-10-CM | POA: Diagnosis not present

## 2022-11-25 DIAGNOSIS — G4730 Sleep apnea, unspecified: Secondary | ICD-10-CM | POA: Diagnosis not present

## 2022-11-25 DIAGNOSIS — I120 Hypertensive chronic kidney disease with stage 5 chronic kidney disease or end stage renal disease: Secondary | ICD-10-CM | POA: Diagnosis not present

## 2022-11-25 MED ORDER — DOXYCYCLINE HYCLATE 100 MG PO CAPS
100.0000 mg | ORAL_CAPSULE | Freq: Two times a day (BID) | ORAL | 0 refills | Status: DC
  Filled 2022-11-25: qty 28, 14d supply, fill #0

## 2022-11-25 NOTE — Progress Notes (Addendum)
RENE, LARDY ([id number]) [id number] Page 1 of 10 Visit Report for 11/25/2022 Chief Complaint Document Details Patient Name: Date of Service: Matthew Drema [place] [name]. 11/25/2022 7:45 A M Medical Record Number: [id number] Patient Account Number: [id number] Date of Birth/Sex: Treating RN: 12/30/1970 (52 y.o. [name] Primary Care Provider: Harrel Lemon Other Clinician: Referring Provider: Treating Provider/Extender: Sheryle Spray in Treatment: 1 Information Obtained from: Patient Chief Complaint Left Great T Ulcer oe Electronic Signature(s) Signed: 11/25/2022 8:06:44 AM By: Worthy Keeler PA-C Entered By: Worthy Keeler on 11/25/2022 08:06:44 -------------------------------------------------------------------------------- Debridement Details Patient Name: Date of Service: Matthew INS, Matthew PHER [name]. 11/25/2022 7:45 A M Medical Record Number: [id number] Patient Account Number: [id number] Date of Birth/Sex: Treating RN: 1971-04-15 (52 y.o. [name] Primary Care Provider: Harrel Lemon Other Clinician: Referring Provider: Treating Provider/Extender: Sheryle Spray in Treatment: 1 Debridement Performed for Assessment: Wound #7 Left,Medial T Great oe Performed By: Physician Tommie Sams., PA-C Debridement Type: Debridement Level of Consciousness (Pre-procedure): Awake and Alert Pre-procedure Verification/Time Out No Taken: Start Time: 08:15 T Area Debrided (L x W): otal 3 (cm) x 1.5 (cm) = 4.5 (cm) Tissue and other material debrided: Viable, Non-Viable, Eschar, Slough, Subcutaneous, Slough Level: Skin/Subcutaneous Tissue Debridement Description: Excisional Instrument: Blade, Forceps Specimen: Swab, Number of Specimens T aken: 1 Bleeding: Moderate Hemostasis Achieved: Pressure Response to Treatment: Procedure was tolerated well Level of Consciousness (Post- Awake and Alert procedure): ROLIN, KOCHANOWSKI ([id number]) [id number] Page 2 of 10 Post Debridement Measurements of Total Wound Length: (cm) 3 Width: (cm) 1.5 Depth: (cm) 0.4 Volume: (cm) 1.414 Character of Wound/Ulcer Post Debridement: Stable Post Procedure Diagnosis Same as Pre-procedure Electronic Signature(s) Signed: 11/25/2022 12:20:58 PM By: Worthy Keeler PA-C Signed: 11/26/2022 5:35:32 PM By: Rosalio Loud MSN RN CNS WTA Entered By: Rosalio Loud on 11/25/2022 08:23:10 -------------------------------------------------------------------------------- Debridement Details Patient Name: Date of Service: [name], Matthew PHER [name]. 11/25/2022 7:45 A M Medical Record Number: [id number] Patient Account Number: [id number] Date of Birth/Sex: Treating RN: Dec 26, 1970 (52 y.o. [name] Primary Care Provider: Harrel Lemon Other Clinician: Referring Provider: Treating Provider/Extender: Sheryle Spray in Treatment: 1 Debridement Performed for Assessment: Wound #6 Left,Posterior T Great oe Performed By: Physician Tommie Sams., PA-C Debridement Type: Debridement Severity of Tissue Pre Debridement: Fat layer exposed Level of Consciousness (Pre-procedure): Awake and Alert Pre-procedure Verification/Time Out No Taken: Start Time: 08:15 T Area Debrided (L x W): otal 1 (cm) x 0.7 (cm) = 0.7 (cm) Tissue and other material debrided: Viable, Non-Viable, Callus, Slough, Subcutaneous, Slough Level: Skin/Subcutaneous Tissue Debridement Description: Excisional Instrument: Curette Bleeding: Moderate Hemostasis Achieved: Pressure Response to Treatment: Procedure was tolerated well Level of Consciousness (Post- Awake and Alert procedure): Post Debridement Measurements of Total Wound Length: (cm) 1 Width: (cm) 0.7 Depth: (cm) 0.4 Volume: (cm) 0.22 Character of Wound/Ulcer Post Debridement: Requires Further Debridement Severity of Tissue Post Debridement: Fat layer  exposed Post Procedure Diagnosis Same as Pre-procedure Electronic Signature(s) Signed: 11/25/2022 12:20:58 PM By: Worthy Keeler PA-C Signed: 11/26/2022 5:35:32 PM By: Rosalio Loud MSN RN CNS WTA Entered By: Rosalio Loud on 11/25/2022 08:27:44 [name] ([id number]) [id number] Page 3 of 10 -------------------------------------------------------------------------------- HPI Details Patient Name: Date of Service: Matthew Drema [place] [name]. 11/25/2022 7:45 A M Medical Record Number: [id number] Patient Account Number: [id number] Date of Birth/Sex: Treating RN: 12-Apr-1971 (52 y.o. [name] Primary Care Provider: Harrel Lemon Other Clinician: Referring Provider: Treating Provider/Extender: Joaquim Lai,  Leanne Chang, John Weeks in Treatment: 1 History of Present Illness HPI Description: 01-08-2022 upon evaluation today patient appears to be doing somewhat poorly here in regard to multiple wounds on his right foot. He has a wound on the fifth toe, third toe, and first toe. The first and fifth toes are the worst the third toe is actually fairly superficial I do not think is can take much to get this to heal. With that being said unfortunately these are burned locations. The patient tells me that he was actually burning stuff out of his yard when this occurred unfortunately. He just was not paying attention he has neuropathy and unfortunately things got out of control. He does have a history of diabetes and that is where the neuropathy comes from. He also has end-stage renal disease he is on peritoneal dialysis. He is actually on the kidney transplant list and that is the biggest concern since this happened he is actually been contacted for kidney transplant however he was turned down due to the fact that he had open wounds on his foot. Subsequently he has to get this healed in order to get back on the transplant list. He is obviously and understandably  extremely frustrated as obviously peritoneal dialysis is a significant issue and very time intensive as well as obviously affecting his daily activities. He has been on Cipro from [facility] Dr. Amalia Hailey for about 10 days. Has been using Betadine and dry gauze dressings at this point try to keep it as dry as possible. Honestly I think we need to get a lot of this eschar off. He does have a hemoglobin A1c most recently of 8.2. This actually occurred on March 4 1 month ago. 01-18-2022 upon evaluation today patient appears to be doing okay in regard to his wounds. Fortunately I do not see any signs of infection or inflammation at this point which is great news. Fortunately I think that he is showing some signs of improvement though he did get a little worried and that the wounds look kind of moist and he was afraid of infection so he went to dressing them so that be more dry. Obviously that may have slowed things down just a little bit here but I completely understand his concern. 01-26-2022 upon evaluation today patient appears to be doing well with regard to his toes I do feel like the middle toe on the right foot is healed. I feel like the fifth toe is causing some issues here with regard to this looking like there is some pressure getting to the area. This is definitely not what we want to see. Subsequently I discussed with the patient that he probably needs to look for some sandals something that would not put pressure on this toe at all to try to prevent this from continuing to be an ongoing issue. He voiced understanding he states he may have something he needs to look when he gets home. 02-02-2022 upon evaluation today patient appears to be doing well with regard to his wound. He has been tolerating the dressing changes without complication. Both the great toe and the fifth toe area seems to be doing awesome. There does not appear to be any evidence of active infection locally or systemically  at this time which is great news. No fevers, chills, nausea, vomiting, or diarrhea. 02-09-2022 upon evaluation today patient actually appears to be making excellent progress here. I am very pleased with where we stand. I do not see any  evidence of active infection locally or systemically which is great news. 02-16-2022 upon evaluation today patient is actually making excellent progress in regard to his wounds. In fact the great toe is healed this is also news. His third toe appears to be doing great and the fifth toe is significantly smaller overall I am very pleased with where things stand at this point. I do not see any evidence of active infection locally or systemically at this time. 02-23-2022 upon evaluation today patient actually appears to be doing awesome in regard to the wound. The last remaining area is actually on his fifth toe and this is significantly improved compared to last week. In fact the overall improvement is roughly 90% compared to last week we are out a very small wound at this time. It is still open but nonetheless I think were probably within 1 maybe 2 weeks at the most of complete closure. It is also possible that it might be even done just come next week even. Nonetheless I do believe that he is on the right track and I think that we are doing quite well here. 03-02-2022 upon evaluation today patient's wound on the right foot actually appears to likely be completely closed. Unfortunately he has a plantar great toe ulcer on the left foot with significant callus buildup this can need to be worked on today this open since he was last here. With that being said he does note that this has happened over the past 4 years intermittently generally it would heal up but just seems to keep coming back they tried to shave down the bone in that area and they have even tried to give him inserts nothing seems to work. 03-09-2022 upon evaluation today patient unfortunately has a blood blister on  the tip of his right great toe. He tells me he "does not know where this came from". With that being said he also still have the plantar aspect of the left great toe ulcer which is doing much better its about half the size of what we measured last week. 05-17-2022 upon evaluation today patient actually appears to be likely completely healed. I do not see any signs of infection at this point which is great news. He was in the hospital for quite some time due to peritonitis he is doing better in that regard now. Readmission: 11-18-2022 this is a gentleman whom I have previously seen although his last visit was actually August 2023. 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 He is actually on antirejection medications at this point and overall looked much healthier and much happier compared to last time I saw him. Unfortunately he does have a wound on the left great toe he tells me that he is to get this taken care of which I completely agree with. With that being said I am not sure whether he ever had vascular testing or intervention previous and I think we probably need to send him for evaluation at this point. He has medical history other than the pancreas and kidney transplant really has not changed although he tells me his diabetes is really under really good control at this point. He still does have neuropathy, history of coronary artery disease, and otherwise seems to be doing really well in my opinion. 11-25-2022 upon evaluation today patient appears to be doing more poorly currently in regard to his wound. Unfortunately he has been having quite a  bit of an issue here with his toe apparently he had a blister that arose shortly after he was in the visit with me last week and this has led to having a fairly significant [name], [name] ([id number]) [id number] Page 4 of 10 wound it has been draining on  the lateral aspect of his great toe the medial aspect is where the actual wound has been that we were taking care of starting last week. Nonetheless this looks like is going require some debridement and I think it goes significantly deep I really believe the 2 may be communicating to some degree. Electronic Signature(s) Signed: 11/25/2022 9:17:22 AM By: Worthy Keeler PA-C Entered By: Worthy Keeler on 11/25/2022 09:17:22 -------------------------------------------------------------------------------- Physical Exam Details Patient Name: Date of Service: Matthew Drema [place] [name]. 11/25/2022 7:45 A M Medical Record Number: [id number] Patient Account Number: [id number] Date of Birth/Sex: Treating RN: May 12, 1971 (52 y.o. [name] Primary Care Provider: Harrel Lemon Other Clinician: Referring Provider: Treating Provider/Extender: Sheryle Spray in Treatment: 1 Constitutional Well-nourished and well-hydrated in no acute distress. Respiratory normal breathing without difficulty. Psychiatric this patient is able to make decisions and demonstrates good insight into disease process. Alert and Oriented x 3. pleasant and cooperative. Notes Upon inspection I did perform debridement and over the lateral aspect of the toe specifically I was able to debride away some of the necrotic tissue which included the use of a scalpel as well as a curette and postdebridement this appears to be doing much better which is great news. With that being said I do think that he is showing signs of excellent improvement in general postdebridement but this is very deep. I think there is also evidence of erythema and likely infection on Monday need to send in a prescription for him and I am to send in doxycycline he has gentamicin he will be using topically. Working to see how things do over the next week. Electronic Signature(s) Signed: 11/25/2022 9:18:00 AM By: Worthy Keeler PA-C Entered By:  Worthy Keeler on 11/25/2022 09:18:00 -------------------------------------------------------------------------------- Physician Orders Details Patient Name: Date of Service: Matthew INS, Matthew PHER [name]. 11/25/2022 7:45 A M Medical Record Number: [id number] Patient Account Number: [id number] Date of Birth/Sex: Treating RN: Dec 12, 1970 (52 y.o. 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 Wound Treatment Wound #6 - T Great oe Wound Laterality: Left, Posterior Cleanser: Dakin 16 (oz) 0.25 3 x Per Week/30 Days Discharge Instructions: Use as directed. Cleanser: Soap and Water 3 x Per Week/30 Days Discharge Instructions: Gently cleanse wound with antibacterial soap, rinse and pat dry prior to dressing wounds Topical: Gentamicin 3 x Per Week/30 Days Discharge Instructions: Apply as directed by provider. Prim Dressing: Gauze 3 x Per Week/30 Days ary Discharge Instructions: As directed: dry, moistened with saline or moistened with Dakins Solution Secondary Dressing: Conforming Guaze Roll-Small 3 x Per Week/30 Days Discharge Instructions: Apply Conforming Stretch Guaze Bandage as directed Secondary Dressing: Tubular Elastic  Net Bandage, Size1, 30 (yd) 3 x Per Week/30 Days Wound #7 - T Great oe Wound  Laterality: Left, Medial Cleanser: Dakin 16 (oz) 0.25 3 x Per Week/30 Days Discharge Instructions: Use as directed. Cleanser: Soap and Water 3 x Per Week/30 Days Discharge Instructions: Gently cleanse wound with antibacterial soap, rinse and pat dry prior to dressing wounds Topical: Gentamicin 3 x Per Week/30 Days Discharge Instructions: Apply as directed by provider. Prim Dressing: Gauze 3 x Per Week/30 Days ary Discharge Instructions: As directed: dry, moistened with saline or moistened with Dakins Solution Secondary Dressing: Conforming Guaze Roll-Small 3 x Per Week/30 Days Discharge Instructions: [place] as directed Secondary Dressing: Tubular Elastic Net Bandage, [place], 30 (yd) 3 x Per Week/30 Days Laboratory Bacteria identified in Wound by Culture (MICRO) LOINC Code: [id number] Convenience Name: Wound culture routine Radiology X-ray, toes Patient Medications llergies: Lyrica, OxyContin, prednisone, Shellfish Containing Products A Notifications Medication Indication Start End 11/25/2022 doxycycline hyclate DOSE 1 - oral 100 mg capsule - 1 capsule oral twice a day x 14 days Electronic Signature(s) JUBAL, ESHBAUGH ([id number]) [id number] Page 6 of 10 Signed: 11/25/2022 10:18:12 AM By: Rosalio Loud MSN RN CNS WTA Signed: 11/25/2022 12:20:58 PM By: Worthy Keeler PA-C Previous Signature: 11/25/2022 10:16:45 AM Version By: Rosalio Loud MSN RN CNS WTA Previous Signature: 11/25/2022 9:19:22 AM Version By: Worthy Keeler PA-C Entered By: Rosalio Loud on 11/25/2022 10:18:11 -------------------------------------------------------------------------------- Problem List Details Patient Name: Date of Service: [name], Matthew PHER [name]. 11/25/2022 7:45 A M Medical Record Number: [id number] Patient Account Number: [id number] Date of Birth/Sex: Treating RN: 08/22/1971 (52 y.o. [name] Primary Care Provider: Harrel Lemon Other  Clinician: Referring Provider: Treating Provider/Extender: Estill Dooms, Moses Manners in Treatment: 1 Active Problems ICD-10 Encounter Code Description Active Date MDM Diagnosis E11.621 Type 2 diabetes mellitus with foot ulcer 11/18/2022 No Yes E11.43 Type 2 diabetes mellitus with diabetic autonomic (poly)neuropathy 11/18/2022 No Yes L97.522 Non-pressure chronic ulcer of other part of left foot with fat layer exposed 11/18/2022 No Yes I25.10 Atherosclerotic heart disease of native coronary artery without angina pectoris 11/18/2022 No Yes Z94.0 Kidney transplant status 11/18/2022 No Yes 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 11/25/2022 7:45 A M Medical Record Number: [id number] Patient Account Number: [id number] Date of Birth/Sex: Treating RN: 03/18/1971 (52 y.o. [name] Primary Care Provider: Harrel Lemon Other Clinician: Referring Provider: Treating Provider/Extender: Sheryle Spray in Treatment: 1 Subjective Chief Complaint Information obtained from Patient Left Great T Ulcer oe History of Present Illness (HPI) 01-08-2022 upon evaluation today patient appears to be doing somewhat poorly here in regard to multiple wounds on his right foot. He has a wound on the fifth toe, third toe, and first toe. The first and fifth toes are the worst the third toe is actually fairly superficial I do not think is can take  much to get this to heal. With that being said unfortunately these are burned locations. The patient tells me that he was actually burning stuff out of his yard when this occurred unfortunately. He just was  not paying attention he has neuropathy and unfortunately things got out of control. He does have a history of diabetes and that is where the neuropathy comes from. He also has end-stage renal disease he is on peritoneal dialysis. He is actually on the kidney transplant list and that is the biggest concern since this happened he is actually been contacted for kidney transplant however he was turned down due to the fact that he had open wounds on his foot. Subsequently he has to get this healed in order to get back on the transplant list. He is obviously and understandably extremely frustrated as obviously peritoneal dialysis is a significant issue and very time intensive as well as obviously affecting his daily activities. He has been on Cipro from [facility] Dr. Amalia Hailey for about 10 days. Has been using Betadine and dry gauze dressings at this point try to keep it as dry as possible. Honestly I think we need to get a lot of this eschar off. He does have a hemoglobin A1c most recently of 8.2. This actually occurred on March 4 1 month ago. 01-18-2022 upon evaluation today patient appears to be doing okay in regard to his wounds. Fortunately I do not see any signs of infection or inflammation at this point which is great news. Fortunately I think that he is showing some signs of improvement though he did get a little worried and that the wounds look kind of moist and he was afraid of infection so he went to dressing them so that be more dry. Obviously that may have slowed things down just a little bit here but I completely understand his concern. 01-26-2022 upon evaluation today patient appears to be doing well with regard to his toes I do feel like the middle toe on the right foot is healed. I feel  like the fifth toe is causing some issues here with regard to this looking like there is some pressure getting to the area. This is definitely not what we want to see. Subsequently I discussed with the patient that he probably needs to look for some sandals something that would not put pressure on this toe at all to try to prevent this from continuing to be an ongoing issue. He voiced understanding he states he may have something he needs to look when he gets home. 02-02-2022 upon evaluation today patient appears to be doing well with regard to his wound. He has been tolerating the dressing changes without complication. Both the great toe and the fifth toe area seems to be doing awesome. There does not appear to be any evidence of active infection locally or systemically at this time which is great news. No fevers, chills, nausea, vomiting, or diarrhea. 02-09-2022 upon evaluation today patient actually appears to be making excellent progress here. I am very pleased with where we stand. I do not see any evidence of active infection locally or systemically which is great news. 02-16-2022 upon evaluation today patient is actually making excellent progress in regard to his wounds. In fact the great toe is healed this is also news. His third toe appears to be doing great and the fifth toe is significantly smaller overall I am very pleased with where things stand at this point. I do not see any evidence of active infection locally or systemically at this time. 02-23-2022 upon evaluation today patient actually appears to be doing awesome in regard to the wound. The last remaining area is actually on his fifth toe and  this is significantly improved compared to last week. In fact the overall improvement is roughly 90% compared to last week we are out a very small wound at this time. It is still open but nonetheless I think were probably within 1 maybe 2 weeks at the most of complete closure. It is also possible that  it might be even done just come next week even. Nonetheless I do believe that he is on the right track and I think that we are doing quite well here. 03-02-2022 upon evaluation today patient's wound on the right foot actually appears to likely be completely closed. Unfortunately he has a plantar great toe ulcer on the left foot with significant callus buildup this can need to be worked on today this open since he was last here. With that being said he does note that this has happened over the past 4 years intermittently generally it would heal up but just seems to keep coming back they tried to shave down the bone in that area and they have even tried to give him inserts nothing seems to work. 03-09-2022 upon evaluation today patient unfortunately has a blood blister on the tip of his right great toe. He tells me he "does not know where this came from". With that being said he also still have the plantar aspect of the left great toe ulcer which is doing much better its about half the size of what we measured last week. 05-17-2022 upon evaluation today patient actually appears to be likely completely healed. I do not see any signs of infection at this point which is great news. He was in the hospital for quite some time due to peritonitis he is doing better in that regard now. Readmission: 11-18-2022 this is a gentleman whom I have previously seen although his last visit was actually August 2023. 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 He is actually on antirejection medications at this point and overall looked much healthier and much happier compared to last time I saw him. Unfortunately he does have a wound on the left great toe he tells me that he is to get this taken care of which I completely agree with. With that being said I am not sure whether he ever had vascular testing or intervention previous and I think we probably need  to send him for evaluation at this point. He has medical history other than the pancreas and kidney transplant really has not changed although he tells me his diabetes is really under really good control at this point. He still does have neuropathy, history of coronary artery disease, and otherwise seems to be doing really well in my opinion. [name], [name] ([id number]) [id number] Page 8 of 10 11-25-2022 upon evaluation today patient appears to be doing more poorly currently in regard to his wound. Unfortunately he has been having quite a bit of an issue here with his toe apparently he had a blister that arose shortly after he was in the visit with me last week and this has led to having a fairly significant wound it has been draining on the lateral aspect of his great toe the medial aspect is where the actual wound has been that we were taking care of starting last week. Nonetheless this looks like is going require some debridement and I think it goes significantly deep I really believe the 2 may be communicating to some degree. Objective Constitutional  Well-nourished and well-hydrated in no acute distress. Vitals Time Taken: 7:45 AM, Height: 71 in, Weight: 209 lbs, BMI: 29.1, Temperature: 98.1 F, Pulse: 77 bpm, Respiratory Rate: 16 breaths/min, Blood Pressure: 160/80 mmHg. Respiratory normal breathing without difficulty. Psychiatric this patient is able to make decisions and demonstrates good insight into disease process. Alert and Oriented x 3. pleasant and cooperative. General Notes: Upon inspection I did perform debridement and over the lateral aspect of the toe specifically I was able to debride away some of the necrotic tissue which included the use of a scalpel as well as a curette and postdebridement this appears to be doing much better which is great news. With that being said I do think that he is showing signs of excellent improvement in general  postdebridement but this is very deep. I think there is also evidence of erythema and likely infection on Monday need to send in a prescription for him and I am to send in doxycycline he has gentamicin he will be using topically. Working to see how things do over the next week. Integumentary (Hair, Skin) Wound #6 status is Open. Original cause of wound was Hematoma. The date acquired was: 08/18/2022. The wound has been in treatment 1 weeks. The wound is located on the [place]. The wound measures 1cm length x 0.7cm width x 0.3cm depth; 0.55cm^2 area and 0.165cm^3 volume. There is Fat oe Layer (Subcutaneous Tissue) exposed. There is a medium amount of serosanguineous drainage noted. There is medium (34-66%) red, pink granulation within the wound bed. There is a medium (34-66%) amount of necrotic tissue within the wound bed including Adherent Slough. Wound #7 status is Open. Original cause of wound was Blister. The date acquired was: 11/19/2022. The wound is located on the Left,Medial T [place]. The oe wound measures 3cm length x 1.5cm width x 0.1cm depth; 3.534cm^2 area and 0.353cm^3 volume. There is Fat Layer (Subcutaneous Tissue) exposed. There is a medium amount of serosanguineous drainage noted. Foul odor after cleansing was noted. There is small (1-33%) red granulation within the wound bed. There is a medium (34-66%) amount of necrotic tissue within the wound bed including Eschar and Adherent Slough. 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 There was a Excisional Skin/Subcutaneous Tissue Debridement with a total area of 0.7 sq cm performed by Tommie Sams., PA-C. With the following instrument(s): Curette to remove Viable and Non-Viable tissue/material. Material removed includes Callus, Subcutaneous Tissue, and Slough. No specimens were taken.A Moderate amount of bleeding was controlled with Pressure. The procedure was tolerated well. Post Debridement Measurements: 1cm length x 0.7cm width x 0.4cm depth; 0.22cm^3 volume. Character of Wound/Ulcer Post Debridement requires further debridement. Severity of Tissue Post Debridement is: Fat layer exposed. Post procedure Diagnosis Wound #6: Same as Pre-Procedure Wound #7 Pre-procedure diagnosis of Wound #7 is an Infection - not elsewhere classified located on the Left,Medial T [place] . There was a Excisional oe Skin/Subcutaneous Tissue Debridement with a total area of 4.5 sq cm performed by Tommie Sams., PA-C. With the following instrument(s): Blade, and Forceps to  remove Viable and Non-Viable tissue/material. Material removed includes Eschar, Subcutaneous Tissue, and Slough. 1 specimen was taken by a Swab and sent to the lab per facility protocol.A Moderate amount of bleeding was controlled with Pressure. The procedure was tolerated well. Post Debridement Measurements: 3cm length x 1.5cm width x 0.4cm depth; 1.414cm^3 volume. Character of Wound/Ulcer Post Debridement is stable. Post procedure Diagnosis Wound #7: Same as Pre-Procedure [name], [name] ([id number]) [id number] Page 9 of 10 Plan Follow-up Appointments: Return Appointment in 1 week. The following medication(s) was prescribed: doxycycline hyclate oral 100 mg capsule 1 1 capsule oral twice a day x 14 days starting 11/25/2022 WOUND #6: - T Great Wound Laterality: Left, Posterior oe Cleanser: Dakin 16 (oz) 0.25 3 x Per Week/30  Days Discharge Instructions: Use as directed. Cleanser: Soap and Water 3 x Per Week/30 Days Discharge Instructions: Gently cleanse wound with antibacterial soap, rinse and pat dry prior to dressing wounds Topical: Gentamicin 3 x Per Week/30 Days Discharge Instructions: Apply as directed by provider. Prim Dressing: Gauze 3 x Per Week/30 Days ary Discharge Instructions: As directed: dry, moistened with saline or moistened with Dakins Solution Secondary Dressing: Conforming Guaze Roll-Small 3 x Per Week/30 Days Discharge Instructions: Apply Conforming Stretch Guaze Bandage as directed Secondary Dressing: Tubular Elastic Net Bandage, Size1, 30 (yd) 3 x Per Week/30 Days WOUND #7: - T Great Wound Laterality: Left, Medial oe Cleanser: Dakin 16 (oz) 0.25 3 x Per Week/30 Days Discharge Instructions: Use as directed. Cleanser: Soap and Water 3 x Per Week/30 Days Discharge Instructions: Gently cleanse wound with antibacterial soap, rinse and pat dry prior to dressing wounds Topical: Gentamicin 3 x Per Week/30 Days Discharge Instructions: Apply as directed by provider. Prim Dressing: Gauze 3 x Per Week/30 Days ary Discharge Instructions: As directed: dry, moistened with saline or moistened with Dakins Solution Secondary Dressing: Conforming Guaze Roll-Small 3 x Per Week/30 Days Discharge Instructions: Apply Conforming Stretch Guaze Bandage as directed Secondary Dressing: Tubular Elastic Net Bandage, Size1, 30 (yd) 3 x Per Week/30 Days 1. I am getting going to send the patient for an x-ray I spoke with Dr. Amalia Hailey unfortunately the x-ray taken in the office last week did not pass through and the images were not saved. For that reason I do think we need to get this checked as quickly as possible. 2. Also would recommend that we have the patient continue with the gentamicin topically which I think is can be helpful. 3. I am also can recommend that the patient should Matthew back on the doxycycline medicine  this into the pharmacy for him today. I think this is probably good to continue and to be perfectly honest one of the medicines we know to be safe with regard to his kidney transplant status. 4. With regard to the wound itself we will get a use of Dakin's moistened gauze dressing at this point he needs to change this at least once a day he may need to change it twice a day depending on the amount of drainage she voiced understanding he is aware. 5. I am also going to Matthew ahead and recommend that we do a wound culture which we did obtain today we will send in this and depend on the results of the culture check and see what we need to do from the antibiotic standpoint going forward obviously were somewhat limited due to the kidney transplant status. We will see patient back for reevaluation in 1 week here in the clinic. If anything worsens  or changes patient will contact our office for additional recommendations. Electronic Signature(s) Signed: 11/25/2022 9:19:59 AM By: Worthy Keeler PA-C Entered By: Worthy Keeler on 11/25/2022 09:19:59 -------------------------------------------------------------------------------- SuperBill Details Patient Name: Date of Service: Matthew INS, Matthew PHER [name]. 11/25/2022 Medical Record Number: [id number] Patient Account Number: [id number] Date of Birth/Sex: Treating RN: 1971/03/03 (52 y.o. [name] Primary Care Provider: Harrel Lemon Other Clinician: Referring Provider: Treating Provider/Extender: Sheryle Spray in Treatment: 1 [name], [name] ([id number]) [phone].pdf Page 10 of 10 Diagnosis Coding ICD-10 Codes Code Description E11.621 Type 2 diabetes mellitus with foot ulcer E11.43 Type 2 diabetes mellitus with diabetic autonomic (poly)neuropathy L97.522 Non-pressure chronic ulcer of other part of left foot with fat layer exposed I25.10 Atherosclerotic heart disease of native coronary artery without  angina pectoris Z94.0 Kidney transplant status Z94.83 Pancreas transplant status Facility Procedures : CPT4 Code: [id number] Description: 11042 - DEB SUBQ TISSUE 20 SQ CM/< ICD-10 Diagnosis Description L97.522 Non-pressure chronic ulcer of other part of left foot with fat layer exposed Modifier: Quantity: 1 Physician Procedures : CPT4 Code Description Modifier [id number] - WC PHYS LEVEL 4 - EST PT 25 ICD-10 Diagnosis Description E11.621 Type 2 diabetes mellitus with foot ulcer E11.43 Type 2 diabetes mellitus with diabetic autonomic (poly)neuropathy L97.522 Non-pressure  chronic ulcer of other part of left foot with fat layer exposed I25.10 Atherosclerotic heart disease of native coronary artery without angina pectoris Quantity: 1 : [id number] 11042 - WC PHYS SUBQ TISS 20 SQ CM ICD-10 Diagnosis Description L97.522 Non-pressure chronic ulcer of other part of left foot with fat layer exposed Quantity: 1 Electronic Signature(s) Signed: 11/25/2022 9:20:16 AM By: Worthy Keeler PA-C Entered By: Worthy Keeler on 11/25/2022 09:20:16

## 2022-11-26 ENCOUNTER — Ambulatory Visit: Payer: Commercial Managed Care - PPO | Admitting: Physician Assistant

## 2022-11-26 DIAGNOSIS — D849 Immunodeficiency, unspecified: Secondary | ICD-10-CM | POA: Diagnosis not present

## 2022-11-26 DIAGNOSIS — Z792 Long term (current) use of antibiotics: Secondary | ICD-10-CM | POA: Diagnosis not present

## 2022-11-26 DIAGNOSIS — Z960 Presence of urogenital implants: Secondary | ICD-10-CM | POA: Diagnosis not present

## 2022-11-26 DIAGNOSIS — Z940 Kidney transplant status: Secondary | ICD-10-CM | POA: Diagnosis not present

## 2022-11-26 DIAGNOSIS — Z9483 Pancreas transplant status: Secondary | ICD-10-CM | POA: Diagnosis not present

## 2022-11-26 DIAGNOSIS — L97529 Non-pressure chronic ulcer of other part of left foot with unspecified severity: Secondary | ICD-10-CM | POA: Diagnosis not present

## 2022-11-27 NOTE — Progress Notes (Addendum)
Matthew, [name] [name] ([id number]) [id number] Page 1 of 7 Visit Report for 11/25/2022 Arrival Information Details Patient Name: Date of Service: Matthew [name]. 11/25/2022 7:45 A M Medical Record Number: [id number] Patient Account Number: [id number] Date of Birth/Sex: Treating RN: 1971/02/06 (52 y.o. [name] Primary Care [name]: [name] Other Clinician: Referring [name]: Treating [name]/Extender: [name] in Treatment: 1 Visit Information History Since Last Visit Added or deleted any medications: No Patient Arrived: Ambulatory Has Dressing in Place as Prescribed: Yes Arrival Time: 07:46 Pain Present Now: No Accompanied By: [name] Transfer Assistance: None Patient Identification Verified: Yes Secondary Verification Process Completed: Yes Patient Requires Transmission-Based No Precautions: Patient Has Alerts: Yes Patient Alerts: Kidney and pancreas transpl Electronic Signature(s) Signed: 11/26/2022 5:35:32 PM By: Matthew Loud MSN RN CNS WTA Entered By: [name] on 11/25/2022 07:46:24 -------------------------------------------------------------------------------- Clinic Level of Care Assessment Details Patient Name: Date of Service: Matthew [name]. 11/25/2022 7:45 A M Medical Record Number: [id number] Patient Account Number: [id number] Date of Birth/Sex: Treating RN: 1971-03-02 (52 y.o. [name] Primary Care [name]: [name] Other Clinician: Referring [name]: Treating [name]/Extender: [name] in Treatment: 1 Clinic Level of Care Assessment Items TOOL 1 Quantity Score []  - 0 Use when EandM and Procedure is performed on INITIAL visit ASSESSMENTS - Nursing Assessment / Reassessment []  - 0 General Physical Exam (combine w/ comprehensive assessment (listed just below) when performed on new pt. evals) []  - 0 Comprehensive Assessment (HX, ROS, Risk  Assessments, Wounds Hx, etc.) ASSESSMENTS - Wound and Skin Assessment / Reassessment []  - 0 Dermatologic / Skin Assessment (not related to wound area) ASSESSMENTS - Ostomy and/or Continence Assessment and Care []  - 0 Incontinence Assessment and Management []  - 0 Ostomy Care Assessment and Management (repouching, etc.) PROCESS - Coordination of Care []  - 0 Simple Patient / Family Education for ongoing care []  - 0 Complex (extensive) Patient / Family Education for ongoing care []  - 0 Staff obtains Programmer, systems, Records, T Results / Process Orders est []  - 0 Staff telephones HHA, Nursing Homes / Clarify orders / etc []  - 0 Routine Transfer to another Facility (non-emergent condition) []  - 0 Routine Hospital Admission (non-emergent condition) []  - 0 New Admissions / Insurance Authorizations / Ordering NPWT Apligraf, etc. , []  - 0 Emergency Hospital Admission (emergent condition) [name], [name] ([id number]) [phone].pdf Page 2 of 7 PROCESS - Special Needs []  - 0 Pediatric / Minor Patient Management []  - 0 Isolation Patient Management []  - 0 Hearing / Language / Visual special needs []  - 0 Assessment of Community assistance (transportation, [name]/C planning, etc.) []  - 0 Additional assistance / Altered mentation []  - 0 Support Surface(s) Assessment (bed, cushion, seat, etc.) INTERVENTIONS - Miscellaneous []  - 0 External ear exam []  - 0 Patient Transfer (multiple staff / Civil Service fast streamer / Similar devices) []  - 0 Simple Staple / Suture removal (25 or less) []  - 0 Complex Staple / Suture removal (26 or more) []  - 0 Hypo/Hyperglycemic Management (do not check if billed separately) []  - 0 Ankle / Brachial Index (ABI) - do not check if billed separately Has the patient been seen at the hospital within the last three years: Yes Total Score: 0 Level Of Care: ____ Electronic Signature(s) Signed: 11/26/2022 5:35:32 PM By: Matthew Loud MSN RN CNS WTA Entered  By: [name] on 11/25/2022 08:29:40 -------------------------------------------------------------------------------- Encounter Discharge Information Details Patient Name: Date of Service: [name], [name]. 11/25/2022  7:45 A M Medical Record Number: [id number] Patient Account Number: [id number] Date of Birth/Sex: Treating RN: 1971/02/09 (52 y.o. [name] Primary Care [name]: [name] Other Clinician: Referring [name]: Treating [name]/Extender: [name] in Treatment: 1 Encounter Discharge Information Items Post Procedure Vitals Discharge Condition: Stable Temperature (F): 98.1 Ambulatory Status: Ambulatory Pulse (bpm): 77 Discharge Destination: Home Respiratory Rate (breaths/min): 16 Transportation: Private Auto Blood Pressure (mmHg): 160/80 Accompanied By: [name] Schedule Follow-up Appointment: Yes Clinical Summary of Care: Electronic Signature(s) Signed: 11/25/2022 10:17:09 AM By: Matthew Loud MSN RN CNS WTA Entered By: [name] on 11/25/2022 10:17:08 -------------------------------------------------------------------------------- Lower Extremity Assessment Details Patient Name: Date of Service: Matthew [name]. 11/25/2022 7:45 A M Medical Record Number: [id number] Patient Account Number: [id number] Date of Birth/Sex: Treating RN: 04/28/1971 (52 y.o. [name] Primary Care Greydon Betke: [name] Other Clinician: Referring [name]: Treating [name]/Extender: [name] in Treatment: 1 Edema Assessment Assessed: [Left: No] Patrice Paradise: No] [Left: Edema] Patrice Paradise: :] [name] [id number] [[id number] Page 3 of 7] Calf Left: Right: Point of Measurement: 36 cm From Medial Instep 40.3 cm Ankle Left: Right: Point of Measurement: 12 cm From Medial Instep 24 cm Vascular Assessment Pulses: Dorsalis Pedis Palpable: [Left:Yes] Electronic  Signature(s) Signed: 11/26/2022 5:35:32 PM By: Matthew Loud MSN RN CNS WTA Entered By: [name] on 11/25/2022 08:01:24 -------------------------------------------------------------------------------- Multi Wound Chart Details Patient Name: Date of Service: [name]. 11/25/2022 7:45 A M Medical Record Number: [id number] Patient Account Number: [id number] Date of Birth/Sex: Treating RN: 04/16/1971 (52 y.o. [name] Primary Care Mccall Lomax: [name] Other Clinician: Referring Chemere Steffler: Treating Abagael Kramm/Extender: [name] in Treatment: 1 Vital Signs Height(in): 62 Pulse(bpm): 55 Weight(lbs): [id number] Blood Pressure(mmHg): 160/80 Body Mass Index(BMI): 29.1 Temperature(F): 98.1 Respiratory Rate(breaths/min): 16 [6:Photos:] [N/A:N/A] Left, Posterior T Great oe Left, Medial T Great oe N/A Wound Location: Hematoma Blister N/A Wounding Event: Diabetic Wound/Ulcer of the Lower Infection - not elsewhere classified N/A Primary Etiology: Extremity Sleep Apnea, Coronary Artery Sleep Apnea, Coronary Artery N/A Comorbid History: Disease, Hypertension, Type I Disease, Hypertension, Type I Diabetes, End Stage Renal Disease, Diabetes, End Stage Renal Disease, Osteoarthritis, Neuropathy Osteoarthritis, Neuropathy 08/18/2022 11/19/2022 N/A Date Acquired: 1 0 N/A Weeks of Treatment: Open Open N/A Wound Status: No No N/A Wound Recurrence: 1x0.7x0.3 3x1.5x0.1 N/A Measurements L x W x [name] (cm) 0.55 3.534 N/A A (cm) : rea 0.165 0.353 N/A Volume (cm) : -25.00% N/A N/A % Reduction in Area: 37.50% N/A N/A % Reduction in Volume: Grade 3 Full Thickness Without Exposed N/A Classification: Support Structures Medium Medium N/A Exudate Amount: Serosanguineous Serosanguineous N/A Exudate Type: red, brown red, brown N/A Exudate Color: No Yes N/A Foul Odor A Cleansing: fter N/A No N/A Odor Anticipated Due to Product Use: Medium (34-66%)  Small (1-33%) N/A Granulation A mount: Red, Pink Red N/A Granulation QualityOBAMA, GARNO ([id number] Page 4 of 7 Medium (34-66%) Medium (34-66%) N/A Necrotic Amount: Adherent Slough Eschar, Adherent Slough N/A Necrotic Tissue: Fat Layer (Subcutaneous Tissue): Yes Fat Layer (Subcutaneous Tissue): Yes N/A Exposed Structures: Fascia: No Fascia: No Tendon: No Tendon: No Muscle: No Muscle: No Joint: No Joint: No Bone: No Bone: No N/A None N/A Epithelialization: Treatment Notes Electronic Signature(s) Signed: 11/26/2022 5:35:32 PM By: Matthew Loud MSN RN CNS WTA Entered By: [name] on 11/25/2022 08:11:29 -------------------------------------------------------------------------------- Multi-Disciplinary Care Plan Details Patient Name: Date of Service: [name], [name]. 11/25/2022 7:45 A  M Medical Record Number: [id number] Patient Account Number: [id number] Date of Birth/Sex: Treating RN: 09-07-1971 (52 y.o. [name] Primary Care Tuvia Woodrick: [name] Other Clinician: Referring Tomeko Scoville: Treating Indy Kuck/Extender: [name] in Treatment: 1 Active Inactive Electronic Signature(s) Signed: 12/21/2022 5:20:32 PM By: Gretta Cool BSN, RN, CWS, Kim RN, BSN Signed: 12/28/2022 11:28:57 AM By: Matthew Loud MSN RN CNS WTA Previous Signature: 11/25/2022 10:50:45 AM Version By: Matthew Loud MSN RN CNS WTA Entered By: Gretta Cool, BSN, RN, CWS, Kim on 12/21/2022 17:20:31 -------------------------------------------------------------------------------- Pain Assessment Details Patient Name: Date of Service: Matthew [name]. 11/25/2022 7:45 A M Medical Record Number: [id number] Patient Account Number: [id number] Date of Birth/Sex: Treating RN: 04/26/1971 (52 y.o. [name] Primary Care Ivi Griffith: [name] Other Clinician: Referring Reino Lybbert: Treating Hadeel Hillebrand/Extender: [name] in Treatment: 1 Active Problems Location of Pain Severity and Description of Pain Patient Has Paino No Site Locations Pain Management and Medication [name], [name] ([id number]) [phone].pdf Page 5 of 7 Current Pain Management: Electronic Signature(s) Signed: 11/26/2022 5:35:32 PM By: Matthew Loud MSN RN CNS WTA Entered By: [name] on 11/25/2022 07:48:13 -------------------------------------------------------------------------------- Patient/Caregiver Education Details Patient Name: Date of Service: [name]. 2/22/2024andnbsp7:45 A M Medical Record Number: [id number] Patient Account Number: [id number] Date of Birth/Gender: Treating RN: Dec 23, 1970 (52 y.o. [name] Primary Care Physician: [name] Other Clinician: Referring Physician: Treating Physician/Extender: [name] in Treatment: 1 Education Assessment Education Provided To: Patient Education Topics Provided Wound Debridement: Handouts: Wound Debridement Methods: Explain/Verbal Responses: State content correctly Wound/Skin Impairment: Handouts: Caring for Your Ulcer Methods: Explain/Verbal Responses: State content correctly Electronic Signature(s) Signed: 11/26/2022 5:35:32 PM By: Matthew Loud MSN RN CNS WTA Entered By: [name] on 11/25/2022 08:30:08 -------------------------------------------------------------------------------- Wound Assessment Details Patient Name: Date of Service: [name]. 11/25/2022 7:45 A M Medical Record Number: [id number] Patient Account Number: [id number] Date of Birth/Sex: Treating RN: [birth year] (52 y.o. [name] Primary Care Shene Maxfield: [name] Other Clinician: Referring Emojean Gertz: Treating Farah Lepak/Extender: [name] in Treatment: 1 Wound Status Wound Number: 6 Primary Diabetic Wound/Ulcer of the Lower Extremity Etiology: Wound Location:  Left, Posterior T Great oe Wound Open Wounding Event: Hematoma Status: Date Acquired: 08/18/2022 Comorbid Sleep Apnea, Coronary Artery Disease, Hypertension, Type I Weeks Of Treatment: 1 History: Diabetes, End Stage Renal Disease, Osteoarthritis, Neuropathy Clustered Wound: No Photos [name], [name] ([id number]) [phone].pdf Page 6 of 7 Wound Measurements Length: (cm) 1 Width: (cm) 0.7 Depth: (cm) 0.3 Area: (cm) 0.55 Volume: (cm) 0.165 % Reduction in Area: -25% % Reduction in Volume: 37.5% Wound Description Classification: Grade 3 Exudate Amount: Medium Exudate Type: Serosanguineous Exudate Color: red, brown Foul Odor After Cleansing: No Slough/Fibrino No Wound Bed Granulation Amount: Medium (34-66%) Exposed Structure Granulation Quality: Red, Pink Fascia Exposed: No Necrotic Amount: Medium (34-66%) Fat Layer (Subcutaneous Tissue) Exposed: Yes Necrotic Quality: Adherent Slough Tendon Exposed: No Muscle Exposed: No Joint Exposed: No Bone Exposed: No Electronic Signature(s) Signed: 11/26/2022 5:35:32 PM By: Matthew Loud MSN RN CNS WTA Entered By: [name] on 11/25/2022 08:00:22 -------------------------------------------------------------------------------- Wound Assessment Details Patient Name: Date of Service: [name], [name]. 11/25/2022 7:45 A M Medical Record Number: [id number] Patient Account Number: [id number] Date of Birth/Sex: Treating RN: [birth year] (52 y.o. [name] Primary Care Isais Klipfel: [name] Other Clinician: Referring Myangel Summons: Treating Trennon Torbeck/Extender: [name] in Treatment: 1 Wound Status Wound Number: 7 Primary Infection -  not elsewhere classified Etiology: Wound Location: Left, Medial T Great oe Wound Open Wounding Event: Blister Status: Date Acquired: 11/19/2022 Comorbid Sleep Apnea, Coronary Artery Disease, Hypertension, Type I Weeks Of Treatment: 0 History:  Diabetes, End Stage Renal Disease, Osteoarthritis, Neuropathy Clustered Wound: No Photos [name], [name] ([id number]) [id number].pdf Page 7 of 7 Wound Measurements Length: (cm) 3 Width: (cm) 1.5 Depth: (cm) 0.1 Area: (cm) 3.534 Volume: (cm) 0.353 % Reduction in Area: % Reduction in Volume: Epithelialization: None Wound Description Classification: Full Thickness Without Exposed Support Structures Exudate Amount: Medium Exudate Type: Serosanguineous Exudate Color: red, brown Foul Odor After Cleansing: Yes Due to Product Use: No Slough/Fibrino Yes Wound Bed Granulation Amount: Small (1-33%) Exposed Structure Granulation Quality: Red Fascia Exposed: No Necrotic Amount: Medium (34-66%) Fat Layer (Subcutaneous Tissue) Exposed: Yes Necrotic Quality: Eschar, Adherent Slough Tendon Exposed: No Muscle Exposed: No Joint Exposed: No Bone Exposed: No Electronic Signature(s) Signed: 11/26/2022 5:35:32 PM By: Matthew Loud MSN RN CNS WTA Entered By: [name] on 11/25/2022 07:59:10 -------------------------------------------------------------------------------- Vitals Details Patient Name: Date of Service: [name], [name]. 11/25/2022 7:45 A M Medical Record Number: [id number] Patient Account Number: [id number] Date of Birth/Sex: Treating RN: 08-14-1971 (52 y.o. [name] Primary Care Roger Kettles: [name] Other Clinician: Referring Tigran Haynie: Treating Infant Zink/Extender: [name] in Treatment: 1 Vital Signs Time Taken: 07:45 Temperature (F): 98.1 Height (in): 71 Pulse (bpm): 77 Weight (lbs): 209 Respiratory Rate (breaths/min): 16 Body Mass Index (BMI): 29.1 Blood Pressure (mmHg): 160/80 Reference Range: 80 - 120 mg / dl Electronic Signature(s) Signed: 11/26/2022 5:35:32 PM By: Matthew Loud MSN RN CNS WTA Entered By: [name] on 11/25/2022 07:48:07

## 2022-12-01 ENCOUNTER — Other Ambulatory Visit: Payer: Self-pay

## 2022-12-01 DIAGNOSIS — Z940 Kidney transplant status: Secondary | ICD-10-CM | POA: Diagnosis not present

## 2022-12-01 DIAGNOSIS — Z9483 Pancreas transplant status: Secondary | ICD-10-CM | POA: Diagnosis not present

## 2022-12-01 DIAGNOSIS — N1330 Unspecified hydronephrosis: Secondary | ICD-10-CM | POA: Diagnosis not present

## 2022-12-01 LAB — AEROBIC CULTURE W GRAM STAIN (SUPERFICIAL SPECIMEN)

## 2022-12-02 ENCOUNTER — Ambulatory Visit: Payer: Commercial Managed Care - PPO | Admitting: Physician Assistant

## 2022-12-02 ENCOUNTER — Other Ambulatory Visit: Payer: Self-pay

## 2022-12-02 DIAGNOSIS — E10621 Type 1 diabetes mellitus with foot ulcer: Secondary | ICD-10-CM | POA: Diagnosis not present

## 2022-12-02 DIAGNOSIS — I10 Essential (primary) hypertension: Secondary | ICD-10-CM | POA: Diagnosis not present

## 2022-12-02 DIAGNOSIS — Z940 Kidney transplant status: Secondary | ICD-10-CM | POA: Diagnosis not present

## 2022-12-02 DIAGNOSIS — D849 Immunodeficiency, unspecified: Secondary | ICD-10-CM | POA: Diagnosis not present

## 2022-12-02 DIAGNOSIS — Z792 Long term (current) use of antibiotics: Secondary | ICD-10-CM | POA: Diagnosis not present

## 2022-12-02 DIAGNOSIS — Z9483 Pancreas transplant status: Secondary | ICD-10-CM | POA: Diagnosis not present

## 2022-12-02 DIAGNOSIS — K432 Incisional hernia without obstruction or gangrene: Secondary | ICD-10-CM | POA: Diagnosis not present

## 2022-12-02 DIAGNOSIS — L97529 Non-pressure chronic ulcer of other part of left foot with unspecified severity: Secondary | ICD-10-CM | POA: Diagnosis not present

## 2022-12-03 ENCOUNTER — Ambulatory Visit: Payer: Commercial Managed Care - PPO | Admitting: Physician Assistant

## 2022-12-03 DIAGNOSIS — L97529 Non-pressure chronic ulcer of other part of left foot with unspecified severity: Secondary | ICD-10-CM | POA: Diagnosis not present

## 2022-12-03 DIAGNOSIS — K432 Incisional hernia without obstruction or gangrene: Secondary | ICD-10-CM | POA: Diagnosis not present

## 2022-12-03 DIAGNOSIS — Z794 Long term (current) use of insulin: Secondary | ICD-10-CM | POA: Diagnosis not present

## 2022-12-03 DIAGNOSIS — S31109A Unspecified open wound of abdominal wall, unspecified quadrant without penetration into peritoneal cavity, initial encounter: Secondary | ICD-10-CM | POA: Diagnosis not present

## 2022-12-03 DIAGNOSIS — I96 Gangrene, not elsewhere classified: Secondary | ICD-10-CM | POA: Diagnosis not present

## 2022-12-03 DIAGNOSIS — E10621 Type 1 diabetes mellitus with foot ulcer: Secondary | ICD-10-CM | POA: Diagnosis not present

## 2022-12-03 DIAGNOSIS — M86172 Other acute osteomyelitis, left ankle and foot: Secondary | ICD-10-CM | POA: Diagnosis not present

## 2022-12-03 DIAGNOSIS — K660 Peritoneal adhesions (postprocedural) (postinfection): Secondary | ICD-10-CM | POA: Diagnosis not present

## 2022-12-04 DIAGNOSIS — Z792 Long term (current) use of antibiotics: Secondary | ICD-10-CM | POA: Diagnosis not present

## 2022-12-04 DIAGNOSIS — D849 Immunodeficiency, unspecified: Secondary | ICD-10-CM | POA: Diagnosis not present

## 2022-12-04 DIAGNOSIS — I10 Essential (primary) hypertension: Secondary | ICD-10-CM | POA: Diagnosis not present

## 2022-12-04 DIAGNOSIS — K432 Incisional hernia without obstruction or gangrene: Secondary | ICD-10-CM | POA: Diagnosis not present

## 2022-12-04 DIAGNOSIS — Z9483 Pancreas transplant status: Secondary | ICD-10-CM | POA: Diagnosis not present

## 2022-12-04 DIAGNOSIS — Z940 Kidney transplant status: Secondary | ICD-10-CM | POA: Diagnosis not present

## 2022-12-05 DIAGNOSIS — Z792 Long term (current) use of antibiotics: Secondary | ICD-10-CM | POA: Diagnosis not present

## 2022-12-05 DIAGNOSIS — D849 Immunodeficiency, unspecified: Secondary | ICD-10-CM | POA: Diagnosis not present

## 2022-12-05 DIAGNOSIS — Z9483 Pancreas transplant status: Secondary | ICD-10-CM | POA: Diagnosis not present

## 2022-12-05 DIAGNOSIS — I10 Essential (primary) hypertension: Secondary | ICD-10-CM | POA: Diagnosis not present

## 2022-12-05 DIAGNOSIS — K432 Incisional hernia without obstruction or gangrene: Secondary | ICD-10-CM | POA: Diagnosis not present

## 2022-12-05 DIAGNOSIS — Z940 Kidney transplant status: Secondary | ICD-10-CM | POA: Diagnosis not present

## 2022-12-06 ENCOUNTER — Other Ambulatory Visit: Payer: Self-pay

## 2022-12-06 DIAGNOSIS — E10621 Type 1 diabetes mellitus with foot ulcer: Secondary | ICD-10-CM | POA: Diagnosis not present

## 2022-12-06 DIAGNOSIS — L97529 Non-pressure chronic ulcer of other part of left foot with unspecified severity: Secondary | ICD-10-CM | POA: Diagnosis not present

## 2022-12-06 DIAGNOSIS — S98119A Complete traumatic amputation of unspecified great toe, initial encounter: Secondary | ICD-10-CM | POA: Insufficient documentation

## 2022-12-06 MED ORDER — CIPROFLOXACIN HCL 500 MG PO TABS
ORAL_TABLET | ORAL | 0 refills | Status: DC
  Filled 2022-12-06: qty 20, 10d supply, fill #0

## 2022-12-10 DIAGNOSIS — Z940 Kidney transplant status: Secondary | ICD-10-CM | POA: Diagnosis not present

## 2022-12-15 DIAGNOSIS — Z89422 Acquired absence of other left toe(s): Secondary | ICD-10-CM

## 2022-12-16 ENCOUNTER — Ambulatory Visit (INDEPENDENT_AMBULATORY_CARE_PROVIDER_SITE_OTHER): Payer: Medicare Other | Admitting: Vascular Surgery

## 2022-12-16 ENCOUNTER — Encounter (INDEPENDENT_AMBULATORY_CARE_PROVIDER_SITE_OTHER): Payer: Medicare Other

## 2022-12-17 ENCOUNTER — Other Ambulatory Visit: Payer: Self-pay

## 2022-12-21 ENCOUNTER — Other Ambulatory Visit: Payer: Self-pay

## 2022-12-24 ENCOUNTER — Other Ambulatory Visit: Payer: Self-pay

## 2022-12-24 DIAGNOSIS — E039 Hypothyroidism, unspecified: Secondary | ICD-10-CM | POA: Diagnosis not present

## 2022-12-24 DIAGNOSIS — K439 Ventral hernia without obstruction or gangrene: Secondary | ICD-10-CM | POA: Diagnosis not present

## 2022-12-24 DIAGNOSIS — D84821 Immunodeficiency due to drugs: Secondary | ICD-10-CM | POA: Diagnosis not present

## 2022-12-24 DIAGNOSIS — Z4822 Encounter for aftercare following kidney transplant: Secondary | ICD-10-CM | POA: Diagnosis not present

## 2022-12-24 DIAGNOSIS — Z89422 Acquired absence of other left toe(s): Secondary | ICD-10-CM | POA: Diagnosis not present

## 2022-12-24 DIAGNOSIS — I10 Essential (primary) hypertension: Secondary | ICD-10-CM | POA: Diagnosis not present

## 2022-12-24 DIAGNOSIS — Z9483 Pancreas transplant status: Secondary | ICD-10-CM | POA: Diagnosis not present

## 2022-12-24 DIAGNOSIS — R7989 Other specified abnormal findings of blood chemistry: Secondary | ICD-10-CM | POA: Diagnosis not present

## 2022-12-24 MED ORDER — MYCOPHENOLATE MOFETIL 250 MG PO CAPS
1000.0000 mg | ORAL_CAPSULE | Freq: Two times a day (BID) | ORAL | 3 refills | Status: DC
  Filled 2022-12-24: qty 720, 90d supply, fill #0
  Filled 2023-03-16: qty 720, 90d supply, fill #1
  Filled 2023-04-18: qty 720, 90d supply, fill #2

## 2022-12-24 MED ORDER — TACROLIMUS 1 MG PO CAPS
ORAL_CAPSULE | ORAL | 3 refills | Status: DC
  Filled 2022-12-24: qty 450, 90d supply, fill #0

## 2022-12-24 MED ORDER — PREDNISONE 5 MG PO TABS
5.0000 mg | ORAL_TABLET | Freq: Every day | ORAL | 4 refills | Status: AC
  Filled 2022-12-24 – 2023-03-16 (×3): qty 90, 90d supply, fill #0
  Filled 2023-07-05: qty 90, 90d supply, fill #1

## 2022-12-27 ENCOUNTER — Other Ambulatory Visit: Payer: Self-pay

## 2023-01-06 ENCOUNTER — Other Ambulatory Visit: Payer: Self-pay

## 2023-01-06 MED ORDER — TACROLIMUS 1 MG PO CAPS
ORAL_CAPSULE | ORAL | 3 refills | Status: DC
  Filled 2023-01-06 – 2023-01-10 (×2): qty 360, 90d supply, fill #0

## 2023-01-10 ENCOUNTER — Other Ambulatory Visit: Payer: Self-pay

## 2023-01-14 DIAGNOSIS — Z940 Kidney transplant status: Secondary | ICD-10-CM | POA: Diagnosis not present

## 2023-01-17 ENCOUNTER — Other Ambulatory Visit: Payer: Self-pay

## 2023-01-17 MED ORDER — TACROLIMUS 1 MG PO CAPS
1.0000 mg | ORAL_CAPSULE | Freq: Two times a day (BID) | ORAL | 3 refills | Status: DC
  Filled 2023-01-17: qty 270, 135d supply, fill #0

## 2023-01-18 ENCOUNTER — Other Ambulatory Visit: Payer: Self-pay

## 2023-01-24 DIAGNOSIS — Z940 Kidney transplant status: Secondary | ICD-10-CM | POA: Diagnosis not present

## 2023-02-04 ENCOUNTER — Other Ambulatory Visit: Payer: Self-pay

## 2023-02-04 MED ORDER — CARVEDILOL 25 MG PO TABS
25.0000 mg | ORAL_TABLET | Freq: Two times a day (BID) | ORAL | 3 refills | Status: DC
  Filled 2023-02-04: qty 180, 90d supply, fill #0

## 2023-02-09 ENCOUNTER — Other Ambulatory Visit: Payer: Self-pay

## 2023-02-09 MED ORDER — TACROLIMUS 1 MG PO CAPS
ORAL_CAPSULE | ORAL | 3 refills | Status: DC
  Filled 2023-02-09 – 2023-03-16 (×3): qty 360, 90d supply, fill #0

## 2023-02-15 ENCOUNTER — Other Ambulatory Visit: Payer: Self-pay

## 2023-02-18 ENCOUNTER — Other Ambulatory Visit: Payer: Self-pay

## 2023-02-23 DIAGNOSIS — Z961 Presence of intraocular lens: Secondary | ICD-10-CM | POA: Diagnosis not present

## 2023-02-23 DIAGNOSIS — H40023 Open angle with borderline findings, high risk, bilateral: Secondary | ICD-10-CM | POA: Diagnosis not present

## 2023-02-23 DIAGNOSIS — H2512 Age-related nuclear cataract, left eye: Secondary | ICD-10-CM | POA: Diagnosis not present

## 2023-03-07 DIAGNOSIS — H2512 Age-related nuclear cataract, left eye: Secondary | ICD-10-CM | POA: Diagnosis not present

## 2023-03-10 DIAGNOSIS — H4312 Vitreous hemorrhage, left eye: Secondary | ICD-10-CM | POA: Diagnosis not present

## 2023-03-10 DIAGNOSIS — E103512 Type 1 diabetes mellitus with proliferative diabetic retinopathy with macular edema, left eye: Secondary | ICD-10-CM | POA: Diagnosis not present

## 2023-03-10 DIAGNOSIS — E113512 Type 2 diabetes mellitus with proliferative diabetic retinopathy with macular edema, left eye: Secondary | ICD-10-CM | POA: Diagnosis not present

## 2023-03-10 DIAGNOSIS — H2512 Age-related nuclear cataract, left eye: Secondary | ICD-10-CM | POA: Diagnosis not present

## 2023-03-16 ENCOUNTER — Other Ambulatory Visit: Payer: Self-pay

## 2023-03-17 ENCOUNTER — Other Ambulatory Visit: Payer: Self-pay

## 2023-03-18 ENCOUNTER — Other Ambulatory Visit: Payer: Self-pay

## 2023-03-25 ENCOUNTER — Other Ambulatory Visit: Payer: Self-pay

## 2023-03-25 DIAGNOSIS — Z940 Kidney transplant status: Secondary | ICD-10-CM | POA: Diagnosis not present

## 2023-03-25 DIAGNOSIS — Z8719 Personal history of other diseases of the digestive system: Secondary | ICD-10-CM | POA: Diagnosis not present

## 2023-03-25 DIAGNOSIS — Z9889 Other specified postprocedural states: Secondary | ICD-10-CM | POA: Diagnosis not present

## 2023-03-25 DIAGNOSIS — D849 Immunodeficiency, unspecified: Secondary | ICD-10-CM | POA: Diagnosis not present

## 2023-03-25 DIAGNOSIS — Z9483 Pancreas transplant status: Secondary | ICD-10-CM | POA: Diagnosis not present

## 2023-03-25 DIAGNOSIS — I10 Essential (primary) hypertension: Secondary | ICD-10-CM | POA: Diagnosis not present

## 2023-03-25 DIAGNOSIS — Z792 Long term (current) use of antibiotics: Secondary | ICD-10-CM | POA: Diagnosis not present

## 2023-03-25 MED ORDER — SILDENAFIL CITRATE 25 MG PO TABS
25.0000 mg | ORAL_TABLET | Freq: Every day | ORAL | 0 refills | Status: DC | PRN
  Filled 2023-03-25: qty 6, 30d supply, fill #0
  Filled 2023-04-18: qty 4, 20d supply, fill #1

## 2023-03-28 ENCOUNTER — Other Ambulatory Visit: Payer: Self-pay

## 2023-03-28 MED ORDER — TACROLIMUS 1 MG PO CAPS
ORAL_CAPSULE | ORAL | 3 refills | Status: DC
  Filled 2023-03-28: qty 270, 90d supply, fill #0

## 2023-04-11 DIAGNOSIS — Z940 Kidney transplant status: Secondary | ICD-10-CM | POA: Diagnosis not present

## 2023-04-13 DIAGNOSIS — E103512 Type 1 diabetes mellitus with proliferative diabetic retinopathy with macular edema, left eye: Secondary | ICD-10-CM | POA: Diagnosis not present

## 2023-04-13 DIAGNOSIS — Z9889 Other specified postprocedural states: Secondary | ICD-10-CM | POA: Diagnosis not present

## 2023-04-18 ENCOUNTER — Other Ambulatory Visit: Payer: Self-pay

## 2023-04-28 DIAGNOSIS — R051 Acute cough: Secondary | ICD-10-CM | POA: Diagnosis not present

## 2023-04-28 DIAGNOSIS — R0981 Nasal congestion: Secondary | ICD-10-CM | POA: Diagnosis not present

## 2023-04-28 DIAGNOSIS — N1330 Unspecified hydronephrosis: Secondary | ICD-10-CM | POA: Diagnosis not present

## 2023-04-28 DIAGNOSIS — Z940 Kidney transplant status: Secondary | ICD-10-CM | POA: Diagnosis not present

## 2023-04-28 DIAGNOSIS — R5383 Other fatigue: Secondary | ICD-10-CM | POA: Diagnosis not present

## 2023-04-28 DIAGNOSIS — Z9483 Pancreas transplant status: Secondary | ICD-10-CM | POA: Diagnosis not present

## 2023-04-28 DIAGNOSIS — N1339 Other hydronephrosis: Secondary | ICD-10-CM | POA: Diagnosis not present

## 2023-04-29 DIAGNOSIS — R531 Weakness: Secondary | ICD-10-CM | POA: Diagnosis not present

## 2023-04-29 DIAGNOSIS — Z792 Long term (current) use of antibiotics: Secondary | ICD-10-CM | POA: Diagnosis not present

## 2023-04-29 DIAGNOSIS — B259 Cytomegaloviral disease, unspecified: Secondary | ICD-10-CM | POA: Diagnosis not present

## 2023-04-29 DIAGNOSIS — R1084 Generalized abdominal pain: Secondary | ICD-10-CM | POA: Diagnosis not present

## 2023-04-29 DIAGNOSIS — Z9483 Pancreas transplant status: Secondary | ICD-10-CM | POA: Diagnosis not present

## 2023-04-29 DIAGNOSIS — D849 Immunodeficiency, unspecified: Secondary | ICD-10-CM | POA: Diagnosis not present

## 2023-04-29 DIAGNOSIS — Z940 Kidney transplant status: Secondary | ICD-10-CM | POA: Diagnosis not present

## 2023-04-30 DIAGNOSIS — Z940 Kidney transplant status: Secondary | ICD-10-CM | POA: Diagnosis not present

## 2023-04-30 DIAGNOSIS — R531 Weakness: Secondary | ICD-10-CM | POA: Diagnosis not present

## 2023-04-30 DIAGNOSIS — Z792 Long term (current) use of antibiotics: Secondary | ICD-10-CM | POA: Diagnosis not present

## 2023-04-30 DIAGNOSIS — B259 Cytomegaloviral disease, unspecified: Secondary | ICD-10-CM | POA: Diagnosis not present

## 2023-04-30 DIAGNOSIS — D849 Immunodeficiency, unspecified: Secondary | ICD-10-CM | POA: Diagnosis not present

## 2023-04-30 DIAGNOSIS — Z9483 Pancreas transplant status: Secondary | ICD-10-CM | POA: Diagnosis not present

## 2023-04-30 DIAGNOSIS — R1084 Generalized abdominal pain: Secondary | ICD-10-CM | POA: Diagnosis not present

## 2023-05-01 ENCOUNTER — Other Ambulatory Visit: Payer: Self-pay

## 2023-05-01 DIAGNOSIS — B259 Cytomegaloviral disease, unspecified: Secondary | ICD-10-CM | POA: Diagnosis not present

## 2023-05-01 DIAGNOSIS — Z9483 Pancreas transplant status: Secondary | ICD-10-CM | POA: Diagnosis not present

## 2023-05-01 DIAGNOSIS — Z940 Kidney transplant status: Secondary | ICD-10-CM | POA: Diagnosis not present

## 2023-05-01 DIAGNOSIS — R1084 Generalized abdominal pain: Secondary | ICD-10-CM | POA: Diagnosis not present

## 2023-05-01 DIAGNOSIS — Z792 Long term (current) use of antibiotics: Secondary | ICD-10-CM | POA: Diagnosis not present

## 2023-05-01 DIAGNOSIS — R531 Weakness: Secondary | ICD-10-CM | POA: Diagnosis not present

## 2023-05-01 DIAGNOSIS — D849 Immunodeficiency, unspecified: Secondary | ICD-10-CM | POA: Diagnosis not present

## 2023-05-01 MED ORDER — ONDANSETRON 4 MG PO TBDP
4.0000 mg | ORAL_TABLET | Freq: Three times a day (TID) | ORAL | 0 refills | Status: DC | PRN
  Filled 2023-05-01: qty 14, 5d supply, fill #0

## 2023-05-01 MED ORDER — FAMOTIDINE 20 MG PO TABS
20.0000 mg | ORAL_TABLET | Freq: Every day | ORAL | 0 refills | Status: DC
  Filled 2023-05-01: qty 30, 30d supply, fill #0

## 2023-05-01 MED ORDER — MYCOPHENOLATE MOFETIL 250 MG PO CAPS
750.0000 mg | ORAL_CAPSULE | Freq: Two times a day (BID) | ORAL | 0 refills | Status: DC
  Filled 2023-05-01: qty 720, 120d supply, fill #0

## 2023-05-01 MED ORDER — CARVEDILOL 6.25 MG PO TABS
6.2500 mg | ORAL_TABLET | Freq: Two times a day (BID) | ORAL | 0 refills | Status: DC
  Filled 2023-05-01: qty 60, 30d supply, fill #0

## 2023-05-01 MED ORDER — VALGANCICLOVIR HCL 450 MG PO TABS
900.0000 mg | ORAL_TABLET | Freq: Two times a day (BID) | ORAL | 2 refills | Status: DC
  Filled 2023-05-01: qty 60, 15d supply, fill #0
  Filled 2023-05-03: qty 120, 30d supply, fill #0
  Filled 2023-06-07 (×2): qty 60, 15d supply, fill #1
  Filled 2023-06-07: qty 12, 3d supply, fill #1

## 2023-05-02 ENCOUNTER — Other Ambulatory Visit: Payer: Self-pay

## 2023-05-03 ENCOUNTER — Other Ambulatory Visit: Payer: Self-pay

## 2023-05-04 DIAGNOSIS — Z940 Kidney transplant status: Secondary | ICD-10-CM | POA: Diagnosis not present

## 2023-05-04 DIAGNOSIS — Z5181 Encounter for therapeutic drug level monitoring: Secondary | ICD-10-CM | POA: Diagnosis not present

## 2023-05-09 ENCOUNTER — Other Ambulatory Visit: Payer: Self-pay

## 2023-05-09 MED ORDER — TACROLIMUS 0.5 MG PO CAPS
ORAL_CAPSULE | ORAL | 90 refills | Status: DC
  Filled 2023-05-09: qty 297, 60d supply, fill #0
  Filled 2023-05-09: qty 153, 30d supply, fill #0

## 2023-05-10 ENCOUNTER — Other Ambulatory Visit: Payer: Self-pay

## 2023-05-18 DIAGNOSIS — H40023 Open angle with borderline findings, high risk, bilateral: Secondary | ICD-10-CM | POA: Diagnosis not present

## 2023-05-18 DIAGNOSIS — Z940 Kidney transplant status: Secondary | ICD-10-CM | POA: Diagnosis not present

## 2023-05-18 DIAGNOSIS — Z961 Presence of intraocular lens: Secondary | ICD-10-CM | POA: Diagnosis not present

## 2023-05-24 ENCOUNTER — Other Ambulatory Visit: Payer: Self-pay

## 2023-05-24 MED ORDER — TACROLIMUS 0.5 MG PO CAPS
ORAL_CAPSULE | ORAL | 3 refills | Status: AC
  Filled 2023-05-24: qty 450, 75d supply, fill #0

## 2023-05-24 MED ORDER — MYCOPHENOLATE MOFETIL 250 MG PO CAPS
500.0000 mg | ORAL_CAPSULE | Freq: Two times a day (BID) | ORAL | 0 refills | Status: AC
  Filled 2023-05-24: qty 720, 180d supply, fill #0

## 2023-06-07 ENCOUNTER — Other Ambulatory Visit: Payer: Self-pay

## 2023-06-08 ENCOUNTER — Other Ambulatory Visit: Payer: Self-pay

## 2023-06-10 DIAGNOSIS — Z940 Kidney transplant status: Secondary | ICD-10-CM | POA: Diagnosis not present

## 2023-06-15 ENCOUNTER — Other Ambulatory Visit: Payer: Self-pay

## 2023-06-15 DIAGNOSIS — Z940 Kidney transplant status: Secondary | ICD-10-CM | POA: Diagnosis not present

## 2023-06-15 DIAGNOSIS — N185 Chronic kidney disease, stage 5: Secondary | ICD-10-CM | POA: Diagnosis not present

## 2023-06-15 DIAGNOSIS — E7800 Pure hypercholesterolemia, unspecified: Secondary | ICD-10-CM | POA: Diagnosis not present

## 2023-06-15 DIAGNOSIS — E213 Hyperparathyroidism, unspecified: Secondary | ICD-10-CM | POA: Diagnosis not present

## 2023-06-15 DIAGNOSIS — D849 Immunodeficiency, unspecified: Secondary | ICD-10-CM | POA: Diagnosis not present

## 2023-06-15 DIAGNOSIS — E1122 Type 2 diabetes mellitus with diabetic chronic kidney disease: Secondary | ICD-10-CM | POA: Diagnosis not present

## 2023-06-15 DIAGNOSIS — Z794 Long term (current) use of insulin: Secondary | ICD-10-CM | POA: Diagnosis not present

## 2023-06-15 DIAGNOSIS — R5383 Other fatigue: Secondary | ICD-10-CM | POA: Diagnosis not present

## 2023-06-15 MED ORDER — OZEMPIC (0.25 OR 0.5 MG/DOSE) 2 MG/3ML ~~LOC~~ SOPN
0.2500 mg | PEN_INJECTOR | SUBCUTANEOUS | 0 refills | Status: DC
Start: 2023-06-15 — End: 2024-05-08
  Filled 2023-06-15: qty 3, 30d supply, fill #0
  Filled 2023-06-15 – 2023-06-22 (×3): qty 3, 28d supply, fill #0

## 2023-06-16 ENCOUNTER — Other Ambulatory Visit: Payer: Self-pay

## 2023-06-20 ENCOUNTER — Other Ambulatory Visit: Payer: Self-pay

## 2023-06-20 MED ORDER — VALGANCICLOVIR HCL 450 MG PO TABS
900.0000 mg | ORAL_TABLET | Freq: Two times a day (BID) | ORAL | 3 refills | Status: DC
  Filled 2023-06-20: qty 120, 30d supply, fill #0
  Filled 2023-07-05 (×2): qty 61, 15d supply, fill #0
  Filled 2023-07-05: qty 117, 29d supply, fill #0
  Filled 2023-07-13: qty 120, 30d supply, fill #1

## 2023-06-20 MED ORDER — ATOVAQUONE 750 MG/5ML PO SUSP
1500.0000 mg | Freq: Every day | ORAL | 5 refills | Status: DC
  Filled 2023-06-20: qty 300, 30d supply, fill #0
  Filled 2023-07-21: qty 300, 30d supply, fill #1

## 2023-06-20 MED ORDER — CARVEDILOL 6.25 MG PO TABS
6.2500 mg | ORAL_TABLET | Freq: Two times a day (BID) | ORAL | 11 refills | Status: DC
  Filled 2023-06-20: qty 60, 30d supply, fill #0
  Filled 2023-07-21: qty 60, 30d supply, fill #1

## 2023-06-21 ENCOUNTER — Other Ambulatory Visit: Payer: Self-pay

## 2023-06-22 ENCOUNTER — Other Ambulatory Visit: Payer: Self-pay

## 2023-06-23 ENCOUNTER — Other Ambulatory Visit: Payer: Self-pay

## 2023-07-05 ENCOUNTER — Other Ambulatory Visit: Payer: Self-pay

## 2023-07-13 ENCOUNTER — Other Ambulatory Visit: Payer: Self-pay

## 2023-07-14 ENCOUNTER — Other Ambulatory Visit: Payer: Self-pay

## 2023-07-18 ENCOUNTER — Other Ambulatory Visit: Payer: Self-pay

## 2023-07-18 DIAGNOSIS — Z9483 Pancreas transplant status: Secondary | ICD-10-CM | POA: Diagnosis not present

## 2023-07-18 DIAGNOSIS — Z940 Kidney transplant status: Secondary | ICD-10-CM | POA: Diagnosis not present

## 2023-07-20 ENCOUNTER — Other Ambulatory Visit: Payer: Self-pay

## 2023-07-20 MED ORDER — VALGANCICLOVIR HCL 450 MG PO TABS
900.0000 mg | ORAL_TABLET | Freq: Every day | ORAL | 3 refills | Status: DC
  Filled 2023-07-20: qty 180, 90d supply, fill #0
  Filled 2023-08-16: qty 60, 30d supply, fill #0

## 2023-07-21 ENCOUNTER — Other Ambulatory Visit: Payer: Self-pay

## 2023-07-21 MED ORDER — OZEMPIC (0.25 OR 0.5 MG/DOSE) 2 MG/3ML ~~LOC~~ SOPN
0.5000 mg | PEN_INJECTOR | SUBCUTANEOUS | 0 refills | Status: DC
  Filled 2023-07-27 (×2): qty 3, 28d supply, fill #0

## 2023-07-25 DIAGNOSIS — Z9483 Pancreas transplant status: Secondary | ICD-10-CM | POA: Diagnosis not present

## 2023-07-25 DIAGNOSIS — Z940 Kidney transplant status: Secondary | ICD-10-CM | POA: Diagnosis not present

## 2023-07-27 ENCOUNTER — Other Ambulatory Visit: Payer: Self-pay

## 2023-08-08 ENCOUNTER — Other Ambulatory Visit: Payer: Self-pay

## 2023-08-10 ENCOUNTER — Other Ambulatory Visit: Payer: Self-pay

## 2023-08-10 MED ORDER — OZEMPIC (1 MG/DOSE) 4 MG/3ML ~~LOC~~ SOPN
1.0000 mg | PEN_INJECTOR | SUBCUTANEOUS | 0 refills | Status: DC
Start: 2023-08-10 — End: 2023-08-26
  Filled 2023-08-18: qty 3, 28d supply, fill #0

## 2023-08-15 ENCOUNTER — Other Ambulatory Visit: Payer: Self-pay

## 2023-08-15 MED ORDER — VALGANCICLOVIR HCL 450 MG PO TABS
900.0000 mg | ORAL_TABLET | Freq: Every day | ORAL | 3 refills | Status: DC
  Filled 2023-08-15: qty 60, 30d supply, fill #0

## 2023-08-15 MED ORDER — MYCOPHENOLATE MOFETIL 250 MG PO CAPS
500.0000 mg | ORAL_CAPSULE | Freq: Two times a day (BID) | ORAL | 3 refills | Status: AC
Start: 2023-08-15 — End: ?
  Filled 2023-08-15 – 2023-08-17 (×2): qty 360, 90d supply, fill #0

## 2023-08-15 MED ORDER — CARVEDILOL 6.25 MG PO TABS
6.2500 mg | ORAL_TABLET | Freq: Two times a day (BID) | ORAL | 3 refills | Status: DC
  Filled 2023-08-15: qty 180, 90d supply, fill #0

## 2023-08-15 MED ORDER — PREDNISONE 5 MG PO TABS: 5.0000 mg | ORAL_TABLET | Freq: Every day | ORAL | 3 refills | Status: AC

## 2023-08-15 MED ORDER — TACROLIMUS 0.5 MG PO CAPS
1.5000 mg | ORAL_CAPSULE | Freq: Two times a day (BID) | ORAL | 3 refills | Status: AC
Start: 2023-08-15 — End: ?
  Filled 2023-08-15: qty 540, 90d supply, fill #0

## 2023-08-15 MED ORDER — ATORVASTATIN CALCIUM 80 MG PO TABS
80.0000 mg | ORAL_TABLET | Freq: Every day | ORAL | 3 refills | Status: AC
  Filled 2023-08-15: qty 90, 90d supply, fill #0

## 2023-08-16 ENCOUNTER — Other Ambulatory Visit: Payer: Self-pay

## 2023-08-17 ENCOUNTER — Other Ambulatory Visit: Payer: Self-pay

## 2023-08-18 ENCOUNTER — Other Ambulatory Visit: Payer: Self-pay

## 2023-08-24 ENCOUNTER — Other Ambulatory Visit: Payer: Self-pay

## 2023-09-14 ENCOUNTER — Other Ambulatory Visit: Payer: Self-pay

## 2023-11-15 ENCOUNTER — Ambulatory Visit (INDEPENDENT_AMBULATORY_CARE_PROVIDER_SITE_OTHER): Payer: Medicare Other

## 2023-11-15 ENCOUNTER — Ambulatory Visit: Payer: Medicare Other | Admitting: Podiatry

## 2023-11-15 ENCOUNTER — Encounter: Payer: Self-pay | Admitting: Podiatry

## 2023-11-15 DIAGNOSIS — L97512 Non-pressure chronic ulcer of other part of right foot with fat layer exposed: Secondary | ICD-10-CM | POA: Diagnosis not present

## 2023-11-15 DIAGNOSIS — E10621 Type 1 diabetes mellitus with foot ulcer: Secondary | ICD-10-CM

## 2023-11-15 DIAGNOSIS — M2042 Other hammer toe(s) (acquired), left foot: Secondary | ICD-10-CM

## 2023-11-15 DIAGNOSIS — L97509 Non-pressure chronic ulcer of other part of unspecified foot with unspecified severity: Secondary | ICD-10-CM

## 2023-11-15 NOTE — Progress Notes (Signed)
Chief Complaint  Patient presents with   Wound Check    "I have a place on the ball of my right foot that I want him to check out.  I also want him to check my 2nd toe on my left foot." N - sore L - 4th met right D - 1.5 weeks O - suddenly C - sore - poke A - none T - My girlfriend cleaned it up  N - toe L - 2nd left D - 4-5 mos O - gradually C - darker, curling since I had the amputation in February 2024 A - none T - none    Subjective:  53 y.o. 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  First noticed about 2 weeks ago.  He cannot recall any eliciting factor that would have started the wound.  He wears good supportive Brooks running shoes   Past Medical History:  Diagnosis Date   Cellulitis    Dialysis patient (HCC)    M, W, F   DKA (diabetic ketoacidosis) (HCC)    Esophageal dysphagia    ESRD (end stage renal disease) (HCC)    Gastroparesis    Herniated cervical disc    HTN (hypertension)    Hypercholesteremia    Hypothyroidism    Morbid obesity (HCC)    OSA (obstructive sleep apnea)    Peritoneal dialysis status (HCC)    PONV (postoperative nausea and vomiting)    after peritoneal dialysis catheter was placed   Presence of insulin pump    a.) parenteral insulin admin with pen as of 09/20/2022; no using pump   S/P cardiac cath    a. 10-15 yrs ago at [facility] due to tachycardia, reportedly normal. b. Normal ETT 03/2012.   Sepsis (HCC)    Sinus tachycardia    a. 24-hr Holter 03/2012 - SR, occ PVCs, no VT, avg HR 96bpm.   TIA (transient ischemic attack)    Type 1 diabetes mellitus [facility])     Past Surgical History:  Procedure Laterality Date   A/V FISTULAGRAM Left 09/14/2022   Procedure: A/V Fistulagram;  Surgeon: Renford Dills, MD;  Location: [facility] INVASIVE CV LAB;  Service:  Cardiovascular;  Laterality: Left;   AV FISTULA PLACEMENT Left 06/30/2022   Procedure: ARTERIOVENOUS (AV) FISTULA CREATION (BRACHIALCEPHALIC);  Surgeon: Renford Dills, MD;  Location: [facility] ORS;  Service: Vascular;  Laterality: Left;   BIOPSY  10/03/2019   Procedure: BIOPSY;  Surgeon: Napoleon Form, MD;  Location: [facility] ENDOSCOPY;  Service: Endoscopy;;   CATARACT EXTRACTION Bilateral    cervical neck fusion     COLONOSCOPY WITH PROPOFOL N/A 01/02/2021   Procedure: COLONOSCOPY WITH PROPOFOL;  Surgeon: Midge Minium, MD;  Location: [facility]ERY [facility];  Service: Endoscopy;  Laterality: N/A;  priority 4 DIABETIC needs potassium draw   ESOPHAGOGASTRODUODENOSCOPY (EGD) WITH PROPOFOL N/A 10/03/2019   Procedure: ESOPHAGOGASTRODUODENOSCOPY (EGD) WITH PROPOFOL;  Surgeon: Napoleon Form, MD;  Location: [facility] ENDOSCOPY;  Service: Endoscopy;  Laterality: N/A;   EYE SURGERY Bilateral    for floaters   HERNIA REPAIR     bilateral   LEFT HEART CATH AND CORONARY ANGIOGRAPHY Left 10/16/2021   Procedure: LEFT HEART CATH AND CORONARY ANGIOGRAPHY;  Surgeon: Iran Ouch, MD;  Location: [facility] INVASIVE CV LAB;  Service: Cardiovascular;  Laterality: Left;   LEFT HEART CATHETERIZATION WITH  CORONARY ANGIOGRAM N/A 12/18/2013   Procedure: LEFT HEART CATHETERIZATION WITH CORONARY ANGIOGRAM;  Surgeon: [name] M Swaziland, MD;  Location: [facility] CATH LAB;  Service: Cardiovascular;  Laterality: N/A;   PERITONEAL CATHETER INSERTION     PERITONEAL CATHETER REMOVAL     POLYPECTOMY  01/02/2021   Procedure: POLYPECTOMY;  Surgeon: Midge Minium, MD;  Location: [facility]ERY [facility];  Service: Endoscopy;;   REVISON OF ARTERIOVENOUS FISTULA Left 09/24/2022   Procedure: REVISON OF ARTERIOVENOUS FISTULA (BRACHIAL CEPHALIC WITH ARTEGRAFT);  Surgeon: Renford Dills, MD;  Location: [facility] ORS;  Service: Vascular;  Laterality: Left;    Allergies  Allergen Reactions   Sulfa Antibiotics Anaphylaxis and Swelling    Pt is not allergic  to iodine, has had iodine in the past w/o premeds and w/ no problems   Oxycontin [Oxycodone Hcl] Nausea And Vomiting   Penicillins Other (See Comments)    Tolerated 1st generation cephalosporin (CEFAZOLIN) on 05/29/2020 & 06/30/2022 without documented ADRs.   Possible reaction many years ago per patient.  Has had PCN without issues since time of "reaction".    Prednisone Other (See Comments)    Patient is diabetic, runs sugar up     Shellfish-Derived Products Swelling    Eyes swell with shellfish Pt is not allergic to iodine, has had iodine in the past w/o premeds and w/ no problem   Pregabalin Other (See Comments)    Muscle twitching and jerks     RT foot 11/15/2023  Objective/Physical Exam General: The patient is alert and oriented x3 in no acute distress.  Dermatology:  Wound #1 noted to the plantar aspect of the right forefoot measuring approximately 0.8 x 0.6 x 0.2 cm (LxWxD).   To the noted ulceration(s), there is no eschar. There is a moderate amount of slough, fibrin, and necrotic tissue noted. Granulation tissue and wound base is red. There is a minimal amount of serosanguineous drainage noted. There is no exposed bone muscle-tendon ligament or joint. There is no malodor. Periwound integrity is intact. Skin is warm, dry and supple bilateral lower extremities.  Vascular: Palpable pedal pulses bilaterally. No edema or erythema noted. Capillary refill within normal limits.  Neurological: Light touch and protective threshold diminished bilateral  Musculoskeletal Exam: History of left great toe amputation.  Subsequent hammertoe contracture noted to the second digit of the left foot  Assessment: 1.  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 diabetes mellitus w/ peripheral neuropathy 3.  History of left great toe amputation 4.  Hammertoe contracture second digit left   Plan of Care:  -Patient was evaluated. -Medically necessary excisional debridement  including subcutaneous tissue was performed using a tissue nipper and a chisel blade. Excisional debridement of all the necrotic nonviable tissue down to healthy bleeding viable tissue was performed with post-debridement measurements same as pre-. -The wound was cleansed and dry sterile dressing applied. -Patient has gentamicin cream at home.  Apply daily with a light dressing -Metatarsal offloading pads were dispensed to applied to the foot to offload pressure from the forefoot -Continue wearing good supportive tennis shoes and sneakers.  Patient has had diabetic shoes in the past but he found them very uncomfortable -In regards to the hammertoe we will simply observe for now.  Currently it is asymptomatic with no wounds or concerning factors -Return to clinic 3 weeks   Felecia Shelling, DPM [facility]  Dr. Felecia Shelling, DPM    [address]  [place], [place] [address]                Office [phone]  Fax [phone]

## 2023-12-06 ENCOUNTER — Encounter: Payer: Self-pay | Admitting: Podiatry

## 2023-12-06 ENCOUNTER — Ambulatory Visit: Payer: Medicare Other | Admitting: Podiatry

## 2023-12-06 VITALS — Ht 71.0 in | Wt 205.0 lb

## 2023-12-06 DIAGNOSIS — L97512 Non-pressure chronic ulcer of other part of right foot with fat layer exposed: Secondary | ICD-10-CM

## 2023-12-06 NOTE — Progress Notes (Signed)
 Chief Complaint  Patient presents with   Wound Check    Pt is here to f/u on ulcer on the bottom of the right foot, pt states its healing well and that his girlfriend looks over it for him.    Subjective:  53 y.o. 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  He believes that the wound is healed completely.  He developed a new wound overlying the great toe about 2 days ago that he first noticed.  He presents for further treatment evaluation   Past Medical History:  Diagnosis Date   Cellulitis    Dialysis patient (HCC)    M, W, F   DKA (diabetic ketoacidosis) (HCC)    Esophageal dysphagia    ESRD (end stage renal disease) (HCC)    Gastroparesis    Herniated cervical disc    HTN (hypertension)    Hypercholesteremia    Hypothyroidism    Morbid obesity (HCC)    OSA (obstructive sleep apnea)    Peritoneal dialysis status (HCC)    PONV (postoperative nausea and vomiting)    after peritoneal dialysis catheter was placed   Presence of insulin pump    a.) parenteral insulin admin with pen as of 09/20/2022; no using pump   S/P cardiac cath    a. 10-15 yrs ago at [facility] due to tachycardia, reportedly normal. b. Normal ETT 03/2012.   Sepsis (HCC)    Sinus tachycardia    a. 24-hr Holter 03/2012 - SR, occ PVCs, no VT, avg HR 96bpm.   TIA (transient ischemic attack)    Type 1 diabetes mellitus [facility])     Past Surgical History:  Procedure Laterality Date   A/V FISTULAGRAM Left 09/14/2022   Procedure: A/V Fistulagram;  Surgeon: Renford Dills, MD;  Location: [facility] INVASIVE CV LAB;  Service: Cardiovascular;  Laterality: Left;   AV FISTULA PLACEMENT Left 06/30/2022   Procedure: ARTERIOVENOUS (AV) FISTULA CREATION (BRACHIALCEPHALIC);  Surgeon: Renford Dills, MD;  Location: [facility] ORS;  Service: Vascular;  Laterality: Left;   BIOPSY   10/03/2019   Procedure: BIOPSY;  Surgeon: Napoleon Form, MD;  Location: [facility];  Service: Endoscopy;;   CATARACT EXTRACTION Bilateral    cervical neck fusion     COLONOSCOPY WITH PROPOFOL N/A 01/02/2021   Procedure: COLONOSCOPY WITH PROPOFOL;  Surgeon: Midge Minium, MD;  Location: [facility]ERY [facility];  Service: Endoscopy;  Laterality: N/A;  priority 4 DIABETIC needs potassium draw   ESOPHAGOGASTRODUODENOSCOPY (EGD) WITH PROPOFOL N/A 10/03/2019   Procedure: ESOPHAGOGASTRODUODENOSCOPY (EGD) WITH PROPOFOL;  Surgeon: Napoleon Form, MD;  [facility]: [facility];  Service: Endoscopy;  Laterality: N/A;   EYE SURGERY Bilateral    for floaters   HERNIA REPAIR     bilateral   LEFT HEART CATH AND CORONARY ANGIOGRAPHY Left 10/16/2021   Procedure: LEFT HEART CATH AND CORONARY ANGIOGRAPHY;  Surgeon: Iran Ouch, MD;  Location: [facility] INV[facility] CV LAB;  Service: Cardiovascular;  Laterality: Left;   LEFT HEART CATHETERIZATION WITH CORONARY ANGIOGRAM N/A 12/18/2013   Procedure: LEFT HEART CATHETERIZATION WITH CORONARY ANGIOGRAM;  Surgeon: [name] M Swaziland, MD;  Location: [facility] CATH LAB;  Service: Cardiovascular;  Laterality: N/A;   PERITONEAL CATHETER INSERTION     PERITONEAL CATHETER REMOVAL     POLYPECTOMY  01/02/2021   Procedure: POLYPECTOMY;  Surgeon: Midge Minium, MD;  Location: [facility] SUR[facility]ERY [facility];  Service:  Endoscopy;;   REVISON OF ARTERIOVENOUS FISTULA Left 09/24/2022   Procedure: REVISON OF ARTERIOVENOUS FISTULA (BRACHIAL CEPHALIC WITH ARTEGRAFT);  Surgeon: Renford Dills, MD;  Location: [facility] ORS;  Service: Vascular;  Laterality: Left;    Allergies  Allergen Reactions   Sulfa Antibiotics Anaphylaxis and Swelling    Pt is not allergic to iodine, has had iodine in the past w/o premeds and w/ no problems   Oxycontin [Oxycodone Hcl] Nausea And Vomiting   Penicillins Other (See Comments)    Tolerated 1st generation cephalosporin (CEFAZOLIN) on 05/29/2020 & 06/30/2022 without  documented ADRs.   Possible reaction many years ago per patient.  Has had PCN without issues since time of "reaction".    Prednisone Other (See Comments)    Patient is diabetic, runs sugar up     Shellfish-Derived Products Swelling    Eyes swell with shellfish Pt is not allergic to iodine, has had iodine in the past w/o premeds and w/ no problem   Pregabalin Other (See Comments)    Muscle twitching and jerks     RT foot 11/15/2023  Objective/Physical Exam General: The patient is alert and oriented x3 in no acute distress.  Dermatology: The wound to the plantar aspect of the right foot has healed and resolved completely.  There is a new wound to the dorsal medial aspect of the IPJ of the right great toe which is superficial and partial-thickness.  Good potential for healing.  He has been applying antibiotic ointment a Band-Aid over the area.   Vascular: Palpable pedal pulses bilaterally. No edema or erythema noted. Capillary refill within normal limits.  Neurological: Light touch and protective threshold diminished bilateral  Musculoskeletal Exam: History of left great toe amputation.  Subsequent hammertoe contracture noted to the second digit of the left foot  Assessment: 1.  Ulcer right plantar forefoot secondary to diabetes mellitus; healed 2. diabetes mellitus w/ peripheral neuropathy 3.  History of left great toe amputation 4.  Hammertoe contracture second digit left 5.  New onset of ulcer to the dorsal medial aspect of the IPJ right great toe  Plan of Care:  -Patient was evaluated. - The wound to the plantar aspect of the foot is healed completely.  Completely resolved. -Advised against going barefoot.  Continue wearing good supportive tennis shoes -Recommend antibiotic ointment and a Band-Aid over the superficial wound to the right great toe which should heal uneventfully -Return to clinic annually   Felecia Shelling, DPM [facility]  Dr. Felecia Shelling, DPM    [address] [place], [place] [address]                Office [phone]  Fax [phone]

## 2024-01-02 ENCOUNTER — Other Ambulatory Visit: Payer: Self-pay

## 2024-03-08 ENCOUNTER — Telehealth: Payer: Self-pay | Admitting: Podiatry

## 2024-03-08 ENCOUNTER — Other Ambulatory Visit: Payer: Self-pay | Admitting: Podiatry

## 2024-03-08 MED ORDER — DOXYCYCLINE HYCLATE 100 MG PO TABS: 100.0000 mg | ORAL_TABLET | Freq: Two times a day (BID) | ORAL | 0 refills | Status: DC

## 2024-03-08 NOTE — Telephone Encounter (Signed)
 Patient has a spot on L foot that appears to be a blister in the middle of his foot that has bust open. I did schedule patient in [place] for Wednesday 6/11 @ 4:30 but patient wanted me to check and see if you would want to see him before then. States he only wants to see you.

## 2024-03-14 ENCOUNTER — Encounter: Payer: Self-pay | Admitting: Podiatry

## 2024-03-14 ENCOUNTER — Ambulatory Visit: Admitting: Podiatry

## 2024-03-14 VITALS — Ht 71.0 in | Wt 205.0 lb

## 2024-03-14 DIAGNOSIS — E08621 Diabetes mellitus due to underlying condition with foot ulcer: Secondary | ICD-10-CM

## 2024-03-14 DIAGNOSIS — L97422 Non-pressure chronic ulcer of left heel and midfoot with fat layer exposed: Secondary | ICD-10-CM | POA: Diagnosis not present

## 2024-03-18 NOTE — Progress Notes (Signed)
 Chief Complaint  Patient presents with   Wound Check    Pt is here due to ulcer on left foot, he is concerned of this due to being a diabetic and losing a toe.    Subjective:  53 y.o. male with PMHx of diabetes mellitus presenting for onset of a new ulcer to the plantar aspect of the left forefoot.  This began after wearing a new pair of shoes and doing increased walking on a treadmill with an incline.   Past Medical History:  Diagnosis Date   Cellulitis    Dialysis patient [facility])    M, W, F   DKA (diabetic ketoacidosis) (HCC)    Esophageal dysphagia    ESRD (end stage renal disease) (HCC)    Gastroparesis    Herniated cervical disc    HTN (hypertension)    Hypercholesteremia    Hypothyroidism    Morbid obesity (HCC)    OSA (obstructive sleep apnea)    Peritoneal dialysis status (HCC)    PONV (postoperative nausea and vomiting)    after peritoneal dialysis catheter was placed   Presence of insulin  pump    a.) parenteral insulin  admin with pen as of 09/20/2022; no using pump   S/P cardiac cath    a. 10-15 yrs ago at [facility] due to tachycardia, reportedly normal. b. Normal ETT 03/2012.   Sepsis (HCC)    Sinus tachycardia    a. 24-hr Holter 03/2012 - SR, occ PVCs, no VT, avg HR 96bpm.   TIA (transient ischemic attack)    Type 1 diabetes mellitus [facility])     Past Surgical History:  Procedure Laterality Date   A/V FISTULAGRAM Left 09/14/2022   Procedure: A/V Fistulagram;  Surgeon: Jackquelyn Mass, MD;  Location: [facility] CV LAB;  Service: Cardiovascular;  Laterality: Left;   AV FISTULA PLACEMENT Left 06/30/2022   Procedure: ARTERIOVENOUS (AV) FISTULA CREATION (BRACHIALCEPHALIC);  Surgeon: Jackquelyn Mass, MD;  Location: [facility] ORS;  Service: Vascular;  Laterality: Left;   BIOPSY  10/03/2019   Procedure: BIOPSY;  Surgeon: Sergio Dandy, MD;  [facility];  Service: Endoscopy;;   CATARACT EXTRACTION Bilateral    cervical neck fusion     COLONOSCOPY WITH  PROPOFOL  N/A 01/02/2021   Procedure: COLONOSCOPY WITH PROPOFOL ;  Surgeon: Marnee Sink, MD;  Location: [facility] SURGERY CNTR;  Service: Endoscopy;  Laterality: N/A;  priority 4 DIABETIC needs potassium draw   ESOPHAGOGASTRODUODENOSCOPY (EGD) WITH PROPOFOL  N/A 10/03/2019   Procedure: ESOPHAGOGASTRODUODENOSCOPY (EGD) WITH PROPOFOL ;  Surgeon: Sergio Dandy, MD;  [facility];  Service: Endoscopy;  Laterality: N/A;   EYE SURGERY Bilateral    for floaters   HERNIA REPAIR     bilateral   LEFT HEART CATH AND CORONARY ANGIOGRAPHY Left 10/16/2021   Procedure: LEFT HEART CATH AND CORONARY ANGIOGRAPHY;  Surgeon: Wenona Hamilton, MD;  Location: [facility] LAB;  Service: Cardiovascular;  Laterality: Left;   LEFT HEART CATHETERIZATION WITH CORONARY ANGIOGRAM N/A 12/18/2013   Procedure: LEFT HEART CATHETERIZATION WITH CORONARY ANGIOGRAM;  Surgeon: [name] M Swaziland, MD;  Location: [facility] CATH LAB;  Service: Cardiovascular;  Laterality: N/A;   PERITONEAL CATHETER INSERTION     PERITONEAL CATHETER REMOVAL     POLYPECTOMY  01/02/2021   Procedure: POLYPECTOMY;  Surgeon: Marnee Sink, MD;  Location: [facility] SURGERY [facility];  Service: Endoscopy;;   REVISON OF ARTERIOVENOUS FISTULA Left 09/24/2022   Procedure: REVISON OF ARTERIOVENOUS FISTULA (BRACHIAL CEPHALIC WITH ARTEGRAFT);  Surgeon: Jackquelyn Mass, MD;  Location:  [facility] ORS;  Service: Vascular;  Laterality: Left;    Allergies  Allergen Reactions   Sulfa  Antibiotics Anaphylaxis and Swelling    Pt is not allergic to iodine , has had iodine  in the past w/o premeds and w/ no problems   Oxycontin  [Oxycodone  Hcl] Nausea And Vomiting   Penicillins Other (See Comments)    Tolerated 1st generation cephalosporin (CEFAZOLIN ) on 05/29/2020 & 06/30/2022 without documented ADRs.   Possible reaction many years ago per patient.  Has had PCN without issues since time of reaction.    Prednisone  Other (See Comments)    Patient is diabetic, runs sugar  up     Shellfish-Derived Products Swelling    Eyes swell with shellfish Pt is not allergic to iodine , has had iodine  in the past w/o premeds and w/ no problem   Pregabalin  Other (See Comments)    Muscle twitching and jerks     LT foot 03/14/2024  Objective/Physical Exam General: The patient is alert and oriented x3 in no acute distress.  Dermatology:  Wound #1 noted to the plantar aspect of the left forefoot measuring approximately 4.5 x 3.5 x 0.2 cm after debridement (LxWxD).   To the noted ulceration(s), there is no eschar. There is a moderate amount of slough, fibrin, and necrotic tissue noted. Granulation tissue and wound base is red. There is a minimal amount of serosanguineous drainage noted. There is no exposed bone muscle-tendon ligament or joint. There is no malodor. Periwound integrity is intact. Skin is warm, dry and supple bilateral lower extremities.  Vascular: Palpable pedal pulses bilaterally. No edema or erythema noted. Capillary refill within normal limits.  Neurological: Light touch and protective threshold diminished bilaterally.   Musculoskeletal Exam: History of left great toe amputation  Assessment: 1.  Ulcer plantar aspect of the left forefoot secondary to diabetes mellitus 2. diabetes mellitus w/ peripheral neuropathy   Plan of Care:  -Patient was evaluated. -Medically necessary excisional debridement including subcutaneous tissue was performed using a tissue nipper and a chisel blade. Excisional debridement of all the necrotic nonviable tissue down to healthy bleeding viable tissue was performed with post-debridement measurements same as pre-. -The wound was cleansed and dry sterile dressing applied. -Recommend daily application of mupirocin ointment or Silvadene  cream. -Currently has a postop shoe.  Continue -Return to clinic 3 weeks   Dot Gazella, DPM [facility]  Dr. Dot Gazella, DPM    [address] [place], [place] [address]                Office [phone]  Fax [phone]

## 2024-04-03 ENCOUNTER — Encounter: Payer: Self-pay | Admitting: Podiatry

## 2024-04-03 ENCOUNTER — Ambulatory Visit: Admitting: Podiatry

## 2024-04-03 VITALS — Ht 71.0 in | Wt 205.0 lb

## 2024-04-03 DIAGNOSIS — L97422 Non-pressure chronic ulcer of left heel and midfoot with fat layer exposed: Secondary | ICD-10-CM

## 2024-04-03 DIAGNOSIS — E08621 Diabetes mellitus due to underlying condition with foot ulcer: Secondary | ICD-10-CM | POA: Diagnosis not present

## 2024-04-03 NOTE — Progress Notes (Signed)
 Chief Complaint  Patient presents with   Wound Check    Pt is here to f/u on wound to left foot, pt states he keeps the foot clean and dry and wrapped up still concern on how it looks and healing.    Subjective:  53 y.o. male with PMHx of diabetes mellitus presenting for follow-up evaluation of an ulcer to the plantar aspect of the left forefoot.  No new complaints   Past Medical History:  Diagnosis Date   Cellulitis    Dialysis patient (HCC)    M, W, F   DKA (diabetic ketoacidosis) (HCC)    Esophageal dysphagia    ESRD (end stage renal disease) (HCC)    Gastroparesis    Herniated cervical disc    HTN (hypertension)    Hypercholesteremia    Hypothyroidism    Morbid obesity (HCC)    OSA (obstructive sleep apnea)    Peritoneal dialysis status (HCC)    PONV (postoperative nausea and vomiting)    after peritoneal dialysis catheter was placed   Presence of insulin  pump    a.) parenteral insulin  admin with pen as of 09/20/2022; no using pump   S/P cardiac cath    a. 10-15 yrs ago at [facility] due to tachycardia, reportedly normal. b. Normal ETT 03/2012.   Sepsis (HCC)    Sinus tachycardia    a. 24-hr Holter 03/2012 - SR, occ PVCs, no VT, avg HR 96bpm.   TIA (transient ischemic attack)    Type 1 diabetes mellitus [facility])     Past Surgical History:  Procedure Laterality Date   A/V FISTULAGRAM Left 09/14/2022   Procedure: A/V Fistulagram;  Surgeon: Jama Cordella MATSU, MD;  Location: [facility] IN[facility] CV LAB;  Service: Cardiovascular;  Laterality: Left;   AV FISTULA PLACEMENT Left 06/30/2022   Procedure: ARTERIOVENOUS (AV) FISTULA CREATION (BRACHIALCEPHALIC);  Surgeon: Jama Cordella MATSU, MD;  Location: [facility] ORS;  Service: Vascular;  Laterality: Left;   BIOPSY  10/03/2019   Procedure: BIOPSY;  Surgeon: Shila Gustav GAILS, MD;  [facility];  Service: Endoscopy;;   CATARACT EXTRACTION Bilateral    cervical neck fusion     COLONOSCOPY WITH PROPOFOL  N/A 01/02/2021   Procedure:  COLONOSCOPY WITH PROPOFOL ;  Surgeon: Jinny Carmine, MD;  Location: [facility] SURGERY CNTR;  Service: Endoscopy;  Laterality: N/A;  priority 4 DIABETIC needs potassium draw   ESOPHAGOGASTRODUODENOSCOPY (EGD) WITH PROPOFOL  N/A 10/03/2019   Procedure: ESOPHAGOGASTRODUODENOSCOPY (EGD) WITH PROPOFOL ;  Surgeon: Shila Gustav GAILS, MD;  Location: [facility];  Service: Endoscopy;  Laterality: N/A;   EYE SURGERY Bilateral    for floaters   HERNIA REPAIR     bilateral   LEFT HEART CATH AND CORONARY ANGIOGRAPHY Left 10/16/2021   Procedure: LEFT HEART CATH AND CORONARY ANGIOGRAPHY;  Surgeon: Darron Deatrice LABOR, MD;  Location: [facility] CV LAB;  Service: Cardiovascular;  Laterality: Left;   LEFT HEART CATHETERIZATION WITH CORONARY ANGIOGRAM N/A 12/18/2013   Procedure: LEFT HEART CATHETERIZATION WITH CORONARY ANGIOGRAM;  Surgeon: [name] M Swaziland, MD;  Location: [facility] CATH LAB;  Service: Cardiovascular;  Laterality: N/A;   PERITONEAL CATHETER INSERTION     PERITONEAL CATHETER REMOVAL     POLYPECTOMY  01/02/2021   Procedure: POLYPECTOMY;  Surgeon: Jinny Carmine, MD;  Location: [facility];  Service: Endoscopy;;   REVISON OF ARTERIOVENOUS FISTULA Left 09/24/2022   Procedure: REVISON OF ARTERIOVENOUS FISTULA (BRACHIAL CEPHALIC WITH ARTEGRAFT);  Surgeon: Jama Cordella MATSU, MD;  Location: [facility] ORS;  Service: Vascular;  Laterality: Left;  Allergies  Allergen Reactions   Sulfa  Antibiotics Anaphylaxis and Swelling    Pt is not allergic to iodine , has had iodine  in the past w/o premeds and w/ no problems   Oxycontin  [Oxycodone  Hcl] Nausea And Vomiting   Penicillins Other (See Comments)    Tolerated 1st generation cephalosporin (CEFAZOLIN ) on 05/29/2020 & 06/30/2022 without documented ADRs.   Possible reaction many years ago per patient.  Has had PCN without issues since time of reaction.    Prednisone  Other (See Comments)    Patient is diabetic, runs sugar up     Shellfish-Derived Products  Swelling    Eyes swell with shellfish Pt is not allergic to iodine , has had iodine  in the past w/o premeds and w/ no problem   Pregabalin  Other (See Comments)    Muscle twitching and jerks     LT foot 03/14/2024  Objective/Physical Exam General: The patient is alert and oriented x3 in no acute distress.  Dermatology:  Moderate improvement.  Wound #1 noted to the plantar aspect of the left forefoot measuring approximately 4.0 x 3.0 x 0.2 cm after debridement (LxWxD).   To the noted ulceration(s), there is no eschar. There is a moderate amount of slough, fibrin, and necrotic tissue noted. Granulation tissue and wound base is red. There is a minimal amount of serosanguineous drainage noted. There is no exposed bone muscle-tendon ligament or joint. There is no malodor. Periwound integrity is intact. Skin is warm, dry and supple bilateral lower extremities.  Vascular: Palpable pedal pulses bilaterally. No edema or erythema noted. Capillary refill within normal limits.  Neurological: Light touch and protective threshold diminished bilaterally.   Musculoskeletal Exam: History of left great toe amputation  Assessment: 1.  Ulcer plantar aspect of the left forefoot secondary to diabetes mellitus 2. diabetes mellitus w/ peripheral neuropathy   Plan of Care:  -Patient was evaluated. -Medically necessary excisional debridement including subcutaneous tissue was performed using a tissue nipper and a chisel blade. Excisional debridement of all the necrotic nonviable tissue down to healthy bleeding viable tissue was performed with post-debridement measurements same as pre-. -The wound was cleansed and dry sterile dressing applied. -Continue Hydrofera Blue and dry dressings -Postop shoe dispensed.  Offloading metatarsal pad was applied to the insole of the postop shoe to offload pressure from the forefoot -Patient has an upcoming appointment at the [facility] wound care center -Return to clinic with me  PRN   Thresa EMERSON Sar, DPM [facility]  Dr. Thresa EMERSON Sar, DPM    [address] [place], [place] [address]                Office [phone]  Fax [phone]

## 2024-04-11 ENCOUNTER — Encounter: Attending: Physician Assistant | Admitting: Physician Assistant

## 2024-04-11 ENCOUNTER — Other Ambulatory Visit: Payer: Self-pay

## 2024-04-11 DIAGNOSIS — I2510 Atherosclerotic heart disease of native coronary artery without angina pectoris: Secondary | ICD-10-CM | POA: Diagnosis not present

## 2024-04-11 DIAGNOSIS — L97522 Non-pressure chronic ulcer of other part of left foot with fat layer exposed: Secondary | ICD-10-CM | POA: Insufficient documentation

## 2024-04-11 DIAGNOSIS — Z940 Kidney transplant status: Secondary | ICD-10-CM | POA: Diagnosis not present

## 2024-04-11 DIAGNOSIS — E1143 Type 2 diabetes mellitus with diabetic autonomic (poly)neuropathy: Secondary | ICD-10-CM | POA: Diagnosis not present

## 2024-04-11 DIAGNOSIS — E11621 Type 2 diabetes mellitus with foot ulcer: Secondary | ICD-10-CM | POA: Diagnosis present

## 2024-04-11 DIAGNOSIS — Z9483 Pancreas transplant status: Secondary | ICD-10-CM | POA: Diagnosis not present

## 2024-04-11 MED ORDER — DOXYCYCLINE HYCLATE 100 MG PO CAPS
100.0000 mg | ORAL_CAPSULE | Freq: Two times a day (BID) | ORAL | 0 refills | Status: AC
  Filled 2024-04-11: qty 28, 14d supply, fill #0

## 2024-04-13 ENCOUNTER — Other Ambulatory Visit: Payer: Self-pay

## 2024-04-13 MED ORDER — LEVOFLOXACIN 500 MG PO TABS
500.0000 mg | ORAL_TABLET | Freq: Every day | ORAL | 0 refills | Status: DC
  Filled 2024-04-13: qty 10, 10d supply, fill #0

## 2024-04-18 ENCOUNTER — Encounter: Admitting: Physician Assistant

## 2024-04-18 DIAGNOSIS — E11621 Type 2 diabetes mellitus with foot ulcer: Secondary | ICD-10-CM | POA: Diagnosis not present

## 2024-04-20 ENCOUNTER — Encounter: Admitting: Physician Assistant

## 2024-04-20 DIAGNOSIS — E11621 Type 2 diabetes mellitus with foot ulcer: Secondary | ICD-10-CM | POA: Diagnosis not present

## 2024-04-24 ENCOUNTER — Encounter: Admitting: Physician Assistant

## 2024-04-24 DIAGNOSIS — E11621 Type 2 diabetes mellitus with foot ulcer: Secondary | ICD-10-CM | POA: Diagnosis not present

## 2024-04-25 ENCOUNTER — Ambulatory Visit: Admitting: Physician Assistant

## 2024-04-26 ENCOUNTER — Ambulatory Visit: Admitting: Physician Assistant

## 2024-05-01 ENCOUNTER — Encounter: Admitting: Physician Assistant

## 2024-05-01 DIAGNOSIS — E11621 Type 2 diabetes mellitus with foot ulcer: Secondary | ICD-10-CM | POA: Diagnosis not present

## 2024-05-04 ENCOUNTER — Other Ambulatory Visit: Payer: Self-pay

## 2024-05-04 MED ORDER — CEFDINIR 300 MG PO CAPS
300.0000 mg | ORAL_CAPSULE | Freq: Two times a day (BID) | ORAL | 0 refills | Status: DC
  Filled 2024-05-04: qty 20, 10d supply, fill #0

## 2024-05-04 NOTE — Progress Notes (Signed)
 Chief Complaint:   Chief Complaint  Patient presents with  . Follow-up    Subjective:   [name] is a 53 y.o. male in today for cough is had about a month.  It was getting better but in the last few days its gotten worse.  He is also had fever and chills.  Current Outpatient Medications  Medication Sig Dispense Refill  . acetaminophen  (TYLENOL ) 325 MG tablet Take 2 tablets (650 mg total) by mouth every 6 (six) hours as needed for Pain or Fever    . albuterol  MDI, PROVENTIL , VENTOLIN , PROAIR , HFA 90 mcg/actuation inhaler Inhale 2 inhalations into the lungs every 6 (six) hours as needed for Wheezing 1 each 2  . aspirin  81 MG EC tablet Take 1 tablet (81 mg total) by mouth once daily 120 tablet 0  . atorvastatin  (LIPITOR ) 80 MG tablet Take 1 tablet (80 mg total) by mouth once daily 90 tablet 3  . carvediloL  (COREG ) 3.125 MG tablet Take 1 tablet (3.125 mg total) by mouth every 12 (twelve) hours (Patient taking differently: Take 3.125 mg by mouth once daily) 60 tablet 11  . cefdinir  (OMNICEF ) 300 mg capsule Take 1 capsule (300 mg total) by mouth 2 (two) times daily for 10 days 20 capsule 0  . cholecalciferol (VITAMIN D3) 2,000 unit tablet Take 2.5 tablets (5,000 Units total) by mouth once daily    . multivitamin tablet Take 1 tablet by mouth once daily    . mycophenolate  (CELLCEPT ) 250 mg capsule Take 2 capsules (500 mg total) by mouth every 12 (twelve) hours 360 capsule 3  . sennosides-docusate (SENOKOT-S) 8.6-50 mg tablet Take 2 tablets by mouth 2 (two) times daily as needed for Constipation 100 tablet 0  . sildenafiL  (VIAGRA ) 50 MG tablet Take 1-2 tablets PRN 30-60 minutes prior to sexual activity. Max dose 100mg  15 tablet 11  . simethicone  (MYLICON) 80 MG chewable tablet Take 1 tablet (80 mg total) by mouth every 6 (six) hours as needed for Flatulence 30 tablet 0  . tacrolimus  (PROGRAF ) 0.5 MG capsule Take 3 capsules (1.5 mg total) by mouth every morning AND 2 capsules (1 mg  total) at bedtime. 150 capsule 11  . tadalafiL (CIALIS) 5 MG tablet Take 1 tablet (5 mg total) by mouth once daily 30 tablet 11  . blood glucose diagnostic test strip Use 1 strip once a week Use as instructed. (Patient not taking: Reported on 05/04/2024)    . blood glucose meter kit Use 1 each once a week (Patient not taking: Reported on 05/04/2024)    . cefdinir  (OMNICEF ) 300 mg capsule Take 1 capsule (300 mg total) by mouth 2 (two) times daily for 10 days 20 capsule 0  . predniSONE  (DELTASONE ) 5 MG tablet Take 1 tablet (5 mg total) by mouth once daily (Patient not taking: Reported on 05/04/2024) 90 tablet 3   No current facility-administered medications for this visit.    Allergies as of 05/04/2024 - Reviewed 05/04/2024  Allergen Reaction Noted  . Shellfish containing products Anaphylaxis 02/25/2017  . Sulfa  (sulfonamide antibiotics) Anaphylaxis, Other (See Comments), and Swelling 12/24/2010  . Gabapentin  Other (See Comments) 04/19/2022  . Oxycodone  Unknown 04/10/2024  . Pregabalin  Other (See Comments) 12/09/2021    Past Medical History:  Diagnosis Date  . Diabetes mellitus type I (CMS/HHS-HCC)    s/p renal-pancreas transplant, 10/2022  . Diabetic nephropathy associated with type 1 diabetes mellitus (CMS/HHS-HCC)   . Diabetic peripheral neuropathy associated with type 1 diabetes mellitus (CMS/HHS-HCC)   .  Diabetic retinopathy associated with type 1 diabetes mellitus (CMS/HHS-HCC)   . Hyperlipidemia   . Hypertension   . OSA (obstructive sleep apnea)   . Secondary hyperparathyroidism of renal origin (CMS/HHS-HCC)     Past Surgical History:  Procedure Laterality Date  . cervical neck fusion  2009  . exostectomy great toe Left 11/16/2018  . 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 FLAP MYOCUTANEOUS/FASCIOCUTANEOUS ABDOMEN N/A 12/03/2022   Procedure: MUSCLE, MYOCUTANEOUS, OR FASCIOCUTANEOUS FLAP; ABDOMEN;  Surgeon: Georgie Cella, MD;  Location: [facility];  Service: Plastic Surgery;  Laterality: N/A;  . COMPLEX REPAIR ABDOMEN N/A 12/03/2022   Procedure: REPAIR, COMPLEX, ABDOMEN; 1.1 CM TO 2.5 CM;  Surgeon: Georgie Cella, MD;  Location: [facility];  Service: Plastic Surgery;  Laterality: N/A;  . DEBRIDEMENT ABDOMEN N/A 12/03/2022   Procedure: DEBRIDEMENT, ABDOMEN; MUSCLE AND/OR FASCIA (INCLUDES EPIDERMIS, DERMIS, AND SUBCUTANEOUS TISSUE, IF PERFORMED); FIRST 20 SQ CM OR LESS;  Surgeon: Georgie Cella, MD;  Location: [facility];  Service: Plastic Surgery;  Laterality: N/A;  . OPEN INCISIONAL HERNIA REPAIR  N/A 12/03/2022   Procedure: OPEN REPAIR OF ANTERIOR ABDOMINAL HERNIA(S), INCISIONAL, INITIAL, INCLUDING IMPLANTATION OF MESH OR OTHER PROSTHESIS WHEN PERFORMED, TOTAL LENGTH OF DEFECT(S); LESS THAN 3 CM, REDUCIBLE;  Surgeon: Georgie Cella, MD;  Location: [facility];  Service: Plastic Surgery;  Laterality: N/A;  . AMPUTATION METATARSAL W/TOE Left 12/03/2022   Procedure: AMPUTATION, METATARSAL, WITH TOE, SINGLE;  Surgeon: Georgie Cella, MD;  Location: [facility];  Service: Plastic Surgery;  Laterality: Left;  . HERNIA REPAIR     x 2     Family History  Problem Relation Name Age of Onset  . Heart disease Maternal Grandmother    . Heart disease Maternal Grandfather    . Liver cancer Paternal Grandmother    . Stroke Paternal Grandmother    . No Known Problems Mother    . No Known Problems Father      Social History:  reports that he has never smoked. He has never used smokeless tobacco. He reports that he does not currently use  alcohol. He reports that he does not use drugs.  Results for orders placed or performed in visit on 05/04/24  Extended Respiratory Viral Panel - [facility]   Specimen: Nasal Swab; Other  Result Value Ref Range   Influenza A PCR Negative Negative   Influenza B PCR Negative Negative   RSV PCR Negative Negative   SARS-CoV2 PCR Negative Negative   Narrative   Positive  Positive results are indicative of the presence of the identified virus, but do not rule out bacterial infection or co-infection with other pathogens not detected by the test. Clinical correlation with patient history and other diagnostic information is necessary to determine patient infection status.  Negative  Negative results do not preclude SARS-CoV-2 infection, Influenza A virus, Influenza B virus and/or RSV infection and should not be used as the sole basis for treatment or other patient management decisions. Negative results must be combined with clinical observations, patient history, and/or epidemiological information.  Test Comments:  This test has not been FDA cleared or approved. This  test has been authorized by FDA under an Emergency Use Authorization (EUA). This test is only authorized for the duration of the declaration that circumstances exist justifying the authorization of emergency use of in vitro diagnostics for detection and/or diagnosis of COVID-19 under Section 564(b) (1) of the Act, 21 U.S.C. 360bbb-3 (b) (1), unless the authorization is terminated or revoked sooner.       ROS:  General: Positive for fever and chills Skin:   No skin lesions, growths, masses, rashes, pruritus  HEENT: No change in vision or hearing. No pain or difficulty with swallowing Respiratory: Positive for cough Objective:   Body mass index is 35.93 kg/m.  BP 110/78   Pulse 108   Temp 37.3 C (99.1 F)   Ht 180.3 cm (5' 11)   Wt (!) 116.8 kg (257 lb 9.6 oz)   SpO2 98%   BMI 35.93 kg/m   General: WD/WN male, in no  acute distress HEENT: Pupils equal and round, EOMI. oral mucosa moist.  Oropharynx clear. Neck: supple, trachea midline; no thyromegaly Respiratory:clear to auscultation.  No dullness to percussion.  No use of accessory muscles. Cardiac:  Regular rate and rhythm without murmur, gallops, or rubs    Assessment/Plan:   Upper respiratory tract infection, unspecified type  (primary encounter diagnosis)  Assessment and Plan  1.  Upper respiratory infection.  Tested patient for RSV and COVID-19.  Both were negative.  Chest x-ray does not show any infiltrates.  Placed him on cefdinir .    Goals     . * Feel better (pt-stated)    . * Lose Weight (pt-stated)    . * Lose Weight (pt-stated)        NORLEEN ALM ROWER, MD  Portions of this note were created using dictation software and may contain typographical errors.  *Some images could not be shown.

## 2024-05-08 ENCOUNTER — Emergency Department

## 2024-05-08 ENCOUNTER — Other Ambulatory Visit: Payer: Self-pay

## 2024-05-08 ENCOUNTER — Inpatient Hospital Stay: Admission: EM | Admit: 2024-05-08 | Discharge: 2024-05-13 | DRG: 853 | Disposition: A | Discharge status: Home Health

## 2024-05-08 DIAGNOSIS — Z79899 Other long term (current) drug therapy: Secondary | ICD-10-CM

## 2024-05-08 DIAGNOSIS — G4733 Obstructive sleep apnea (adult) (pediatric): Secondary | ICD-10-CM | POA: Diagnosis present

## 2024-05-08 DIAGNOSIS — E1043 Type 1 diabetes mellitus with diabetic autonomic (poly)neuropathy: Secondary | ICD-10-CM | POA: Diagnosis present

## 2024-05-08 DIAGNOSIS — N186 End stage renal disease: Secondary | ICD-10-CM | POA: Diagnosis present

## 2024-05-08 DIAGNOSIS — Z9841 Cataract extraction status, right eye: Secondary | ICD-10-CM

## 2024-05-08 DIAGNOSIS — I120 Hypertensive chronic kidney disease with stage 5 chronic kidney disease or end stage renal disease: Secondary | ICD-10-CM | POA: Diagnosis present

## 2024-05-08 DIAGNOSIS — D84821 Immunodeficiency due to drugs: Secondary | ICD-10-CM | POA: Diagnosis present

## 2024-05-08 DIAGNOSIS — E10621 Type 1 diabetes mellitus with foot ulcer: Secondary | ICD-10-CM | POA: Diagnosis present

## 2024-05-08 DIAGNOSIS — E1022 Type 1 diabetes mellitus with diabetic chronic kidney disease: Secondary | ICD-10-CM | POA: Diagnosis present

## 2024-05-08 DIAGNOSIS — R7989 Other specified abnormal findings of blood chemistry: Secondary | ICD-10-CM | POA: Diagnosis present

## 2024-05-08 DIAGNOSIS — Z9483 Pancreas transplant status: Secondary | ICD-10-CM

## 2024-05-08 DIAGNOSIS — Z9842 Cataract extraction status, left eye: Secondary | ICD-10-CM

## 2024-05-08 DIAGNOSIS — Z981 Arthrodesis status: Secondary | ICD-10-CM

## 2024-05-08 DIAGNOSIS — L03116 Cellulitis of left lower limb: Secondary | ICD-10-CM | POA: Diagnosis present

## 2024-05-08 DIAGNOSIS — Z8249 Family history of ischemic heart disease and other diseases of the circulatory system: Secondary | ICD-10-CM

## 2024-05-08 DIAGNOSIS — L0390 Cellulitis, unspecified: Secondary | ICD-10-CM | POA: Diagnosis not present

## 2024-05-08 DIAGNOSIS — E039 Hypothyroidism, unspecified: Secondary | ICD-10-CM | POA: Diagnosis present

## 2024-05-08 DIAGNOSIS — Z7985 Long-term (current) use of injectable non-insulin antidiabetic drugs: Secondary | ICD-10-CM

## 2024-05-08 DIAGNOSIS — Z885 Allergy status to narcotic agent status: Secondary | ICD-10-CM

## 2024-05-08 DIAGNOSIS — Z940 Kidney transplant status: Secondary | ICD-10-CM

## 2024-05-08 DIAGNOSIS — L97522 Non-pressure chronic ulcer of other part of left foot with fat layer exposed: Secondary | ICD-10-CM | POA: Diagnosis present

## 2024-05-08 DIAGNOSIS — Z888 Allergy status to other drugs, medicaments and biological substances status: Secondary | ICD-10-CM

## 2024-05-08 DIAGNOSIS — E10649 Type 1 diabetes mellitus with hypoglycemia without coma: Secondary | ICD-10-CM | POA: Diagnosis not present

## 2024-05-08 DIAGNOSIS — R110 Nausea: Secondary | ICD-10-CM | POA: Diagnosis present

## 2024-05-08 DIAGNOSIS — Z7982 Long term (current) use of aspirin: Secondary | ICD-10-CM | POA: Diagnosis not present

## 2024-05-08 DIAGNOSIS — E7800 Pure hypercholesterolemia, unspecified: Secondary | ICD-10-CM | POA: Diagnosis present

## 2024-05-08 DIAGNOSIS — E1069 Type 1 diabetes mellitus with other specified complication: Secondary | ICD-10-CM | POA: Diagnosis present

## 2024-05-08 DIAGNOSIS — M24572 Contracture, left ankle: Secondary | ICD-10-CM | POA: Diagnosis present

## 2024-05-08 DIAGNOSIS — Z9641 Presence of insulin pump (external) (internal): Secondary | ICD-10-CM | POA: Diagnosis present

## 2024-05-08 DIAGNOSIS — Z91013 Allergy to seafood: Secondary | ICD-10-CM

## 2024-05-08 DIAGNOSIS — A419 Sepsis, unspecified organism: Principal | ICD-10-CM | POA: Diagnosis present

## 2024-05-08 DIAGNOSIS — Z89412 Acquired absence of left great toe: Secondary | ICD-10-CM

## 2024-05-08 DIAGNOSIS — Z79621 Long term (current) use of calcineurin inhibitor: Secondary | ICD-10-CM

## 2024-05-08 DIAGNOSIS — I959 Hypotension, unspecified: Secondary | ICD-10-CM | POA: Diagnosis not present

## 2024-05-08 DIAGNOSIS — Z992 Dependence on renal dialysis: Secondary | ICD-10-CM | POA: Diagnosis not present

## 2024-05-08 DIAGNOSIS — E109 Type 1 diabetes mellitus without complications: Secondary | ICD-10-CM | POA: Diagnosis present

## 2024-05-08 DIAGNOSIS — Z7952 Long term (current) use of systemic steroids: Secondary | ICD-10-CM

## 2024-05-08 DIAGNOSIS — M86172 Other acute osteomyelitis, left ankle and foot: Secondary | ICD-10-CM | POA: Diagnosis not present

## 2024-05-08 DIAGNOSIS — R509 Fever, unspecified: Secondary | ICD-10-CM | POA: Diagnosis present

## 2024-05-08 DIAGNOSIS — M009 Pyogenic arthritis, unspecified: Secondary | ICD-10-CM | POA: Diagnosis present

## 2024-05-08 DIAGNOSIS — R21 Rash and other nonspecific skin eruption: Secondary | ICD-10-CM | POA: Diagnosis present

## 2024-05-08 DIAGNOSIS — Z880 Allergy status to penicillin: Secondary | ICD-10-CM

## 2024-05-08 DIAGNOSIS — D849 Immunodeficiency, unspecified: Secondary | ICD-10-CM | POA: Diagnosis present

## 2024-05-08 DIAGNOSIS — M869 Osteomyelitis, unspecified: Secondary | ICD-10-CM | POA: Diagnosis present

## 2024-05-08 DIAGNOSIS — Z882 Allergy status to sulfonamides status: Secondary | ICD-10-CM

## 2024-05-08 DIAGNOSIS — E10628 Type 1 diabetes mellitus with other skin complications: Secondary | ICD-10-CM | POA: Diagnosis present

## 2024-05-08 DIAGNOSIS — Z823 Family history of stroke: Secondary | ICD-10-CM

## 2024-05-08 DIAGNOSIS — Z89422 Acquired absence of other left toe(s): Secondary | ICD-10-CM

## 2024-05-08 DIAGNOSIS — I10 Essential (primary) hypertension: Secondary | ICD-10-CM | POA: Diagnosis present

## 2024-05-08 DIAGNOSIS — K3184 Gastroparesis: Secondary | ICD-10-CM | POA: Diagnosis present

## 2024-05-08 DIAGNOSIS — R6520 Severe sepsis without septic shock: Principal | ICD-10-CM

## 2024-05-08 DIAGNOSIS — Z794 Long term (current) use of insulin: Secondary | ICD-10-CM

## 2024-05-08 DIAGNOSIS — Z8673 Personal history of transient ischemic attack (TIA), and cerebral infarction without residual deficits: Secondary | ICD-10-CM

## 2024-05-08 DIAGNOSIS — Z833 Family history of diabetes mellitus: Secondary | ICD-10-CM

## 2024-05-08 DIAGNOSIS — R053 Chronic cough: Secondary | ICD-10-CM | POA: Diagnosis present

## 2024-05-08 LAB — CBC WITH DIFFERENTIAL/PLATELET
Abs Immature Granulocytes: 0.06 K/uL (ref 0.00–0.07)
Basophils Absolute: 0 K/uL (ref 0.0–0.1)
Basophils Relative: 0 %
Eosinophils Absolute: 0.2 K/uL (ref 0.0–0.5)
Eosinophils Relative: 2 %
HCT: 43.2 % (ref 39.0–52.0)
Hemoglobin: 14.3 g/dL (ref 13.0–17.0)
Immature Granulocytes: 1 %
Lymphocytes Relative: 21 %
Lymphs Abs: 2.5 K/uL (ref 0.7–4.0)
MCH: 28.9 pg (ref 26.0–34.0)
MCHC: 33.1 g/dL (ref 30.0–36.0)
MCV: 87.4 fL (ref 80.0–100.0)
Monocytes Absolute: 1.4 K/uL — ABNORMAL HIGH (ref 0.1–1.0)
Monocytes Relative: 11 %
Neutro Abs: 7.9 K/uL — ABNORMAL HIGH (ref 1.7–7.7)
Neutrophils Relative %: 65 %
Platelets: 250 K/uL (ref 150–400)
RBC: 4.94 MIL/uL (ref 4.22–5.81)
RDW: 13.6 % (ref 11.5–15.5)
WBC: 12.1 K/uL — ABNORMAL HIGH (ref 4.0–10.5)
nRBC: 0 % (ref 0.0–0.2)

## 2024-05-08 LAB — URINALYSIS, W/ REFLEX TO CULTURE (INFECTION SUSPECTED)
Bacteria, UA: NONE SEEN
Bilirubin Urine: NEGATIVE
Glucose, UA: NEGATIVE mg/dL
Hgb urine dipstick: NEGATIVE
Ketones, ur: 5 mg/dL — AB
Leukocytes,Ua: NEGATIVE
Nitrite: NEGATIVE
Protein, ur: 30 mg/dL — AB
Specific Gravity, Urine: 1.031 — ABNORMAL HIGH (ref 1.005–1.030)
pH: 5 (ref 5.0–8.0)

## 2024-05-08 LAB — RESPIRATORY PANEL BY PCR

## 2024-05-08 LAB — BASIC METABOLIC PANEL WITH GFR
Anion gap: 8 (ref 5–15)
BUN: 17 mg/dL (ref 6–20)
CO2: 24 mmol/L (ref 22–32)
Calcium: 9.7 mg/dL (ref 8.9–10.3)
Chloride: 98 mmol/L (ref 98–111)
Creatinine, Ser: 1.34 mg/dL — ABNORMAL HIGH (ref 0.61–1.24)
GFR, Estimated: >60 mL/min (ref >60)
Glucose, Bld: 150 mg/dL — ABNORMAL HIGH (ref 70–99)
Potassium: 4 mmol/L (ref 3.5–5.1)
Sodium: 130 mmol/L — ABNORMAL LOW (ref 135–145)

## 2024-05-08 LAB — LACTIC ACID, PLASMA: Lactic Acid, Venous: 1.5 mmol/L (ref 0.5–1.9)

## 2024-05-08 LAB — LIPASE, BLOOD: Lipase: 25 U/L (ref 11–51)

## 2024-05-08 LAB — HEPATIC FUNCTION PANEL
ALT: 15 U/L (ref 0–44)
AST: 23 U/L (ref 15–41)
Albumin: 3.1 g/dL — ABNORMAL LOW (ref 3.5–5.0)
Alkaline Phosphatase: 67 U/L (ref 38–126)
Bilirubin, Direct: 0.3 mg/dL — ABNORMAL HIGH (ref 0.0–0.2)
Indirect Bilirubin: 1 mg/dL — ABNORMAL HIGH (ref 0.3–0.9)
Total Bilirubin: 1.3 mg/dL — ABNORMAL HIGH (ref 0.0–1.2)
Total Protein: 7.5 g/dL (ref 6.5–8.1)

## 2024-05-08 LAB — PROTIME-INR
INR: 1.1 (ref 0.8–1.2)
Prothrombin Time: 14.9 s (ref 11.4–15.2)

## 2024-05-08 MED ORDER — PANTOPRAZOLE SODIUM 40 MG IV SOLR
40.0000 mg | INTRAVENOUS | Status: DC
  Administered 2024-05-08: 40 mg via INTRAVENOUS
  Filled 2024-05-08: qty 10

## 2024-05-08 MED ORDER — MYCOPHENOLATE MOFETIL 250 MG PO CAPS: 500.0000 mg | ORAL_CAPSULE | Freq: Two times a day (BID) | ORAL | Status: DC

## 2024-05-08 MED ORDER — ACETAMINOPHEN 500 MG PO TABS
1000.0000 mg | ORAL_TABLET | Freq: Once | ORAL | Status: AC
  Administered 2024-05-08: 1000 mg via ORAL
  Filled 2024-05-08: qty 2

## 2024-05-08 MED ORDER — SODIUM CHLORIDE 0.9 % IV SOLN
2.0000 g | Freq: Three times a day (TID) | INTRAVENOUS | Status: DC
  Administered 2024-05-08 – 2024-05-11 (×8): 2 g via INTRAVENOUS
  Filled 2024-05-08 (×9): qty 12.5

## 2024-05-08 MED ORDER — SODIUM CHLORIDE 0.9 % IV SOLN
2.0000 g | Freq: Once | INTRAVENOUS | Status: AC
  Administered 2024-05-08: 2 g via INTRAVENOUS
  Filled 2024-05-08: qty 12.5

## 2024-05-08 MED ORDER — GUAIFENESIN ER 600 MG PO TB12
600.0000 mg | ORAL_TABLET | Freq: Two times a day (BID) | ORAL | Status: DC
  Administered 2024-05-08 – 2024-05-13 (×8): 600 mg via ORAL
  Filled 2024-05-08 (×9): qty 1

## 2024-05-08 MED ORDER — FAMOTIDINE 20 MG PO TABS
20.0000 mg | ORAL_TABLET | Freq: Once | ORAL | Status: AC
  Administered 2024-05-08: 20 mg via ORAL
  Filled 2024-05-08: qty 1

## 2024-05-08 MED ORDER — ACETAMINOPHEN 325 MG PO TABS
650.0000 mg | ORAL_TABLET | Freq: Four times a day (QID) | ORAL | Status: DC | PRN
  Administered 2024-05-09 – 2024-05-10 (×3): 650 mg via ORAL
  Filled 2024-05-08 (×3): qty 2

## 2024-05-08 MED ORDER — TACROLIMUS 1 MG PO CAPS
1.5000 mg | ORAL_CAPSULE | Freq: Every morning | ORAL | Status: DC
  Administered 2024-05-09 – 2024-05-13 (×5): 1.5 mg via ORAL
  Filled 2024-05-08 (×5): qty 1

## 2024-05-08 MED ORDER — TACROLIMUS 1 MG PO CAPS
1.0000 mg | ORAL_CAPSULE | Freq: Every day | ORAL | Status: DC
  Administered 2024-05-08 – 2024-05-12 (×5): 1 mg via ORAL
  Filled 2024-05-08 (×6): qty 1

## 2024-05-08 MED ORDER — ATORVASTATIN CALCIUM 80 MG PO TABS
80.0000 mg | ORAL_TABLET | Freq: Every day | ORAL | Status: DC
  Administered 2024-05-09 – 2024-05-13 (×5): 80 mg via ORAL
  Filled 2024-05-08 (×5): qty 1

## 2024-05-08 MED ORDER — ONDANSETRON HCL 4 MG/2ML IJ SOLN
4.0000 mg | Freq: Four times a day (QID) | INTRAMUSCULAR | Status: DC | PRN
  Administered 2024-05-12 – 2024-05-13 (×2): 4 mg via INTRAVENOUS
  Filled 2024-05-08 (×2): qty 2

## 2024-05-08 MED ORDER — CARVEDILOL 3.125 MG PO TABS
3.1250 mg | ORAL_TABLET | Freq: Two times a day (BID) | ORAL | Status: DC
Start: 2024-05-08 — End: 2024-05-14
  Administered 2024-05-08 – 2024-05-13 (×8): 3.125 mg via ORAL
  Filled 2024-05-08 (×9): qty 1

## 2024-05-08 MED ORDER — ACETAMINOPHEN 650 MG RE SUPP: 650.0000 mg | Freq: Four times a day (QID) | RECTAL | Status: DC | PRN

## 2024-05-08 MED ORDER — ALUM & MAG HYDROXIDE-SIMETH 200-200-20 MG/5ML PO SUSP
30.0000 mL | Freq: Once | ORAL | Status: AC
  Administered 2024-05-08: 30 mL via ORAL
  Filled 2024-05-08: qty 30

## 2024-05-08 MED ORDER — ONDANSETRON HCL 4 MG PO TABS
4.0000 mg | ORAL_TABLET | Freq: Four times a day (QID) | ORAL | Status: DC | PRN
Start: 2024-05-08 — End: 2024-05-14
  Administered 2024-05-08 – 2024-05-12 (×4): 4 mg via ORAL
  Filled 2024-05-08 (×4): qty 1

## 2024-05-08 MED ORDER — ONDANSETRON HCL 4 MG/2ML IJ SOLN
4.0000 mg | Freq: Once | INTRAMUSCULAR | Status: AC
  Administered 2024-05-08: 4 mg via INTRAVENOUS
  Filled 2024-05-08: qty 2

## 2024-05-08 MED ORDER — PREDNISONE 10 MG PO TABS
5.0000 mg | ORAL_TABLET | Freq: Every day | ORAL | Status: DC
  Administered 2024-05-09 – 2024-05-13 (×5): 5 mg via ORAL
  Filled 2024-05-08 (×4): qty 1
  Filled 2024-05-08: qty 0.5
  Filled 2024-05-08: qty 1

## 2024-05-08 MED ORDER — ASPIRIN 81 MG PO TBEC
81.0000 mg | DELAYED_RELEASE_TABLET | Freq: Every day | ORAL | Status: DC
  Administered 2024-05-09 – 2024-05-13 (×5): 81 mg via ORAL
  Filled 2024-05-08 (×5): qty 1

## 2024-05-08 MED ORDER — ALBUTEROL SULFATE (2.5 MG/3ML) 0.083% IN NEBU: 2.5000 mg | INHALATION_SOLUTION | RESPIRATORY_TRACT | Status: DC | PRN

## 2024-05-08 MED ORDER — INSULIN PUMP
SUBCUTANEOUS | Status: DC
  Filled 2024-05-08: qty 1

## 2024-05-08 MED ORDER — HYDROCODONE-ACETAMINOPHEN 5-325 MG PO TABS
1.0000 | ORAL_TABLET | ORAL | Status: DC | PRN
  Administered 2024-05-09 – 2024-05-12 (×2): 2 via ORAL
  Administered 2024-05-12: 1 via ORAL
  Administered 2024-05-13: 2 via ORAL
  Filled 2024-05-08: qty 1
  Filled 2024-05-08 (×3): qty 2

## 2024-05-08 MED ORDER — VANCOMYCIN HCL 2000 MG/400ML IV SOLN
2000.0000 mg | Freq: Once | INTRAVENOUS | Status: AC
  Administered 2024-05-08: 2000 mg via INTRAVENOUS
  Filled 2024-05-08: qty 400

## 2024-05-08 MED ORDER — LIDOCAINE VISCOUS HCL 2 % MT SOLN
15.0000 mL | Freq: Once | OROMUCOSAL | Status: AC
  Administered 2024-05-08: 15 mL via ORAL
  Filled 2024-05-08: qty 15

## 2024-05-08 MED ORDER — VANCOMYCIN HCL 1250 MG/250ML IV SOLN
1250.0000 mg | Freq: Two times a day (BID) | INTRAVENOUS | Status: DC
  Administered 2024-05-08: 1250 mg via INTRAVENOUS
  Filled 2024-05-08 (×3): qty 250

## 2024-05-08 MED ORDER — LACTATED RINGERS IV SOLN
150.0000 mL/h | INTRAVENOUS | Status: DC
  Administered 2024-05-08: 150 mL/h via INTRAVENOUS

## 2024-05-08 MED ORDER — MYCOPHENOLATE MOFETIL 250 MG PO CAPS
500.0000 mg | ORAL_CAPSULE | Freq: Two times a day (BID) | ORAL | Status: DC
  Administered 2024-05-08 – 2024-05-13 (×10): 500 mg via ORAL
  Filled 2024-05-08 (×11): qty 2

## 2024-05-08 MED ORDER — SODIUM CHLORIDE 0.9 % IV BOLUS
1000.0000 mL | Freq: Once | INTRAVENOUS | Status: AC
  Administered 2024-05-08: 1000 mL via INTRAVENOUS

## 2024-05-08 NOTE — Assessment & Plan Note (Signed)
 S/p pancreas transplant No longer on insulin  Checks CBG daily - low 100s

## 2024-05-08 NOTE — Assessment & Plan Note (Addendum)
 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 from midnight in case of need for OR debridement Pain control Continuing immunosuppressants, per [facility] transplant recommendations

## 2024-05-08 NOTE — Assessment & Plan Note (Deleted)
 PPI ordered

## 2024-05-08 NOTE — Assessment & Plan Note (Addendum)
 Nausea with dry heaving Patient with 3 months of unresolved cough, treated with antibiotics No evidence of pneumonia or bronchitis on chest x-ray Antitussives as needed.  Albuterol  if wheezing  Empiric PPI for possible GERD Outpatient follow-up-can consider outpatient PFTs/referral to pulmonologist

## 2024-05-08 NOTE — ED Notes (Signed)
 [facility] called for transfer per Dr Clarine Md, spoke with Dorian

## 2024-05-08 NOTE — H&P (Incomplete)
 History and Physical    Patient: [name] FMW:[id number] DOB: Oct 11, 1970 DOA: 05/08/2024 DOS: the patient was seen and examined on 05/08/2024 PCP: Rudolpho Norleen JONETTA, MD  Patient coming from: Home  Chief Complaint:  Chief Complaint  Patient presents with   Pneumonia    HPI: [name] is a 53 y.o. 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  Of note, patient has a prior left great toe amputation related to his diabetic neuropathy..  Separately, he also reports a cough for the past 2 months   Has been on different antibiotics for both foot wound and cough, most recently Levaquin  without improvement.  He has now developed fever, chills, fatigue and nausea . denies dysuria but has been urinating less than usual.  Denies change in bowel habits. In the ED he had a temp of 100.3 and heart rate up to 99, normal BP and O2 sats in the high 90s on room air. Labs notable for WBC 12,000 and normal lactic acid of 1.5.  Respiratory viral panel pending. Glucose 150 with sodium 130 with normal anion gap. Creatinine 1.34(baseline 1.1  04/03/2024) Lipase and LFTs unremarkable Urinalysis showing few ketones with no infection EKG with sinus rhythm at 93 Foot x-ray with soft tissue edema left forefoot and possible plantar wound. Chest x-ray nonacute  The ED provider: Spoke with patient's transplant doctors at [facility] who recommended no need for transfer advised on treating with Zosyn .   Patient started on cefepime  and vancomycin , given a NS bolus  Admission requested   Review of Systems: As mentioned in the history of present  illness. All other systems reviewed and are negative.  Past Medical History:  Diagnosis Date   Cellulitis    Dialysis patient [facility])    M, W, F   DKA (diabetic ketoacidosis) (HCC)    Esophageal dysphagia    ESRD (end stage renal disease) (HCC)    Gastroparesis    Herniated cervical disc    HTN (hypertension)    Hypercholesteremia    Hypothyroidism    Morbid obesity (HCC)    OSA (obstructive sleep apnea)    Peritoneal dialysis status (HCC)    PONV (postoperative nausea and vomiting)    after peritoneal dialysis catheter was placed   Presence of insulin  pump    a.) parenteral insulin  admin with pen as of 09/20/2022; no using pump   S/P cardiac cath    a. 10-15 yrs ago at [facility] due to tachycardia, reportedly normal. b. Normal ETT 03/2012.   Sepsis (HCC)    Sinus tachycardia    a. 24-hr Holter 03/2012 - SR, occ PVCs, no VT, avg HR 96bpm.   TIA (transient ischemic attack)    Type 1 diabetes mellitus [facility])    Past Surgical History:  Procedure Laterality Date   A/V FISTULAGRAM Left 09/14/2022   Procedure: A/V Fistulagram;  Surgeon: Jama Cordella MATSU, MD;  Location: [facility] INVASIVE CV LAB;  Service: Cardiovascular;  Laterality: Left;   AV FISTULA PLACEMENT Left 06/30/2022   Procedure: ARTERIOVENOUS (AV) FISTULA CREATION (BRACHIALCEPHALIC);  Surgeon: Jama Cordella MATSU, MD;  Location: [facility] ORS;  Service: Vascular;  Laterality: Left;   BIOPSY  10/03/2019  Procedure: BIOPSY;  Surgeon: Shila Gustav GAILS, MD;  Location: [facility] ENDOSCOPY;  Service: Endoscopy;;   CATARACT EXTRACTION Bilateral    cervical neck fusion     COLONOSCOPY WITH PROPOFOL  N/A 01/02/2021   Procedure: COLONOSCOPY WITH PROPOFOL ;  Surgeon: Jinny Carmine, MD;  Location: [facility];  Service: Endoscopy;  Laterality: N/A;  priority 4 DIABETIC needs potassium draw   ESOPHAGOGASTRODUODENOSCOPY (EGD) WITH PROPOFOL  N/A 10/03/2019   Procedure: ESOPHAGOGASTRODUODENOSCOPY (EGD) WITH PROPOFOL ;  Surgeon: Shila Gustav GAILS, MD;   Location: [facility] ENDOSCOPY;  Service: Endoscopy;  Laterality: N/A;   EYE SURGERY Bilateral    for floaters   HERNIA REPAIR     bilateral   LEFT HEART CATH AND CORONARY ANGIOGRAPHY Left 10/16/2021   Procedure: LEFT HEART CATH AND CORONARY ANGIOGRAPHY;  Surgeon: Darron Deatrice LABOR, MD;  Location: [facility] INVASIVE CV LAB;  Service: Cardiovascular;  Laterality: Left;   LEFT HEART CATHETERIZATION WITH CORONARY ANGIOGRAM N/A 12/18/2013   Procedure: LEFT HEART CATHETERIZATION WITH CORONARY ANGIOGRAM;  Surgeon: [name] M Swaziland, MD;  Location: [facility] CATH LAB;  Service: Cardiovascular;  Laterality: N/A;   PERITONEAL CATHETER INSERTION     PERITONEAL CATHETER REMOVAL     POLYPECTOMY  01/02/2021   Procedure: POLYPECTOMY;  Surgeon: Jinny Carmine, MD;  Location: [facility];  Service: Endoscopy;;   REVISON OF ARTERIOVENOUS FISTULA Left 09/24/2022   Procedure: REVISON OF ARTERIOVENOUS FISTULA (BRACHIAL CEPHALIC WITH ARTEGRAFT);  Surgeon: Jama Cordella MATSU, MD;  Location: [facility] ORS;  Service: Vascular;  Laterality: Left;   Social History:  reports that he has never smoked. He has never used smokeless tobacco. He reports that he does not drink alcohol and does not use drugs.  Allergies  Allergen Reactions   Sulfa  Antibiotics Anaphylaxis and Swelling    Pt is not allergic to iodine , has had iodine  in the past w/o premeds and w/ no problems   Oxycontin  [Oxycodone  Hcl] Nausea And Vomiting   Penicillins Other (See Comments)    Tolerated 1st generation cephalosporin (CEFAZOLIN ) on 05/29/2020 & 06/30/2022 without documented ADRs.   Possible reaction many years ago per patient.  Has had PCN without issues since time of reaction.    Prednisone  Other (See Comments)    Patient is diabetic, runs sugar up     Shellfish-Derived Products Swelling    Eyes swell with shellfish Pt is not allergic to iodine , has had iodine  in the past w/o premeds and w/ no problem   Pregabalin  Other (See Comments)    Muscle twitching  and jerks    Family History  Problem Relation Age of Onset   Hypertension Other    Coronary artery disease Other        Mother's side - both her side's grandparents died of heart disease (MIs)   Diabetes Other    Stroke Other        Paternal grandfather (52)   Prostate cancer Neg Hx    Bladder Cancer Neg Hx    Kidney cancer Neg Hx     Prior to Admission medications   Medication Sig Start Date End Date Taking? Authorizing Provider  albuterol  (VENTOLIN  HFA) 108 (90 Base) MCG/ACT inhaler 2 puffs every 6 (six) hours as needed for wheezing. 04/03/24 04/03/25 Yes [provider]  aspirin  EC 81 MG tablet Take 81 mg by mouth daily. 10/08/22  Yes [provider]  atorvastatin  (LIPITOR ) 80 MG tablet Take 1 tablet (80 mg total) by mouth daily. 10/29/22  Yes   carvedilol  (COREG ) 3.125 MG tablet Take  3.125 mg by mouth every 12 (twelve) hours. 05/08/24  Yes [provider]  Cholecalciferol 50 MCG (2000 UT) TABS Take 5,000 Units by mouth daily. 06/23/23 06/22/24 Yes [provider]  Multiple Vitamin (MULTI-VITAMIN) tablet Take 1 tablet by mouth daily. 06/23/23 06/22/24 Yes [provider]  atorvastatin  (LIPITOR ) 80 MG tablet Take 1 tablet (80 mg total) by mouth once daily 08/15/23     carvedilol  (COREG ) 6.25 MG tablet Take 1 tablet (6.25 mg total) by mouth 2 (two) times daily 06/20/23     carvedilol  (COREG ) 6.25 MG tablet Take 1 tablet (6.25 mg total) by mouth 2 (two) times daily 08/15/23     cefdinir  (OMNICEF ) 300 MG capsule Take 1 capsule (300 mg total) by mouth 2 (two) times daily for 10 days. 05/04/24     doxycycline  (VIBRA -TABS) 100 MG tablet Take 1 tablet (100 mg total) by mouth 2 (two) times daily. 03/08/24   Janit Thresa HERO, DPM  levofloxacin  (LEVAQUIN ) 500 MG tablet Take 1 tablet (500 mg total) by mouth daily for 10 days. 04/13/24     mycophenolate  (CELLCEPT ) 250 MG capsule Take 4 capsules (1,000 mg total) by mouth every 12 (twelve) hours. 10/28/22     mycophenolate   (CELLCEPT ) 250 MG capsule Take 2 capsules (500 mg total) by mouth every 12 (twelve) hours. 05/24/23     mycophenolate  (CELLCEPT ) 250 MG capsule Take 2 capsules (500 mg total) by mouth every 12 (twelve) hours. 08/15/23     predniSONE  (DELTASONE ) 5 MG tablet Take 1 tablet (5 mg total) by mouth daily. 12/24/22     predniSONE  (DELTASONE ) 5 MG tablet Take 1 tablet (5 mg total) by mouth once daily 08/15/23     Semaglutide ,0.25 or 0.5MG /DOS, (OZEMPIC , 0.25 OR 0.5 MG/DOSE,) 2 MG/3ML SOPN Inject 0.25 mg into the skin once a week. 06/15/23     Semaglutide ,0.25 or 0.5MG /DOS, (OZEMPIC , 0.25 OR 0.5 MG/DOSE,) 2 MG/3ML SOPN Inject 0.5 mg into the skin once weekly. 07/21/23     tacrolimus  (PROGRAF ) 0.5 MG capsule Take 3 capsules (1.5 mg total) by mouth every morning AND 3 capsules (1.5 mg total) at bedtime. 05/24/23     tacrolimus  (PROGRAF ) 0.5 MG capsule Take 3 capsules (1.5 mg total) by mouth every morning AND 3 capsules (1.5 mg total) at bedtime. 08/15/23     valGANciclovir  (VALCYTE ) 450 MG tablet Take 2 tablets (900 mg total) by mouth daily with dinner 07/20/23     valGANciclovir  (VALCYTE ) 450 MG tablet Take 2 tablets (900 mg total) by mouth daily with dinner 08/15/23     amLODipine  (NORVASC ) 10 MG tablet Take 1 tablet (10 mg total) by mouth daily. 10/28/22 04/18/23    cloNIDine  (CATAPRES ) 0.2 MG tablet Take 1 tablet (0.2 mg total) by mouth 2 (two) times daily. 08/03/22 04/18/23    hydrALAZINE  (APRESOLINE ) 100 MG tablet Take 1 tablet (100 mg total) by mouth 3 (three) times daily 11/16/22 04/18/23    insulin  lispro (HUMALOG ) 100 UNIT/ML KwikPen Inject 50 Units into the skin daily in divided doses. Patient taking differently: Inject 40-45 Units into the skin 3 (three) times daily. Uses SS 09/09/22 04/18/23  Damian Therisa HERO, MD  minoxidil  (LONITEN ) 2.5 MG tablet Take 1 Tablet Oral as directed Patient taking differently: Take 2.5 mg by mouth every morning. 06/02/22 04/18/23    pantoprazole  (PROTONIX ) 40 MG tablet Take 1 tablet  (40 mg total) by mouth once daily Patient taking differently: Take 40 mg by mouth every morning. 03/18/22 04/18/23    promethazine  (  PHENERGAN ) 25 MG tablet Take 1/2 (12.5mg ) to 1 (25mg ) tab as needed for nausea and vomiting not controlled with Zofran  up to every 6 hours. 09/30/20 12/04/20  Oris Camie BRAVO, NP    Physical Exam: Vitals:   05/08/24 1700 05/08/24 1730 05/08/24 1800 05/08/24 2112  BP: (!) 149/64  137/67 139/69  Pulse: 91  85 74  Resp: 16 (!) 26 16 20   Temp:    98.3 F (36.8 C)  TempSrc:    Oral  SpO2: 98%  94% 95%  Weight:      Height:       Physical Exam Vitals and nursing note reviewed.  Constitutional:      General: He is not in acute distress. HENT:     Head: Normocephalic and atraumatic.  Cardiovascular:     Rate and Rhythm: Normal rate and regular rhythm.     Heart sounds: Normal heart sounds.  Pulmonary:     Effort: Pulmonary effort is normal.     Breath sounds: Normal breath sounds.  Abdominal:     Palpations: Abdomen is soft.     Tenderness: There is no abdominal tenderness.  Musculoskeletal:     Comments: 2 cm in diameter ulcer plantar aspect left 4th and 5th metatarsals with small area of purulent drainage.  Redness extending along lateral edge of forefoot to dorsum of foot up to ankle..  Warmth over erythematous area going up lower leg See pic.  Neurological:     Mental Status: Mental status is at baseline.     Media Information    Media Information  Document Information     Labs on Admission: I have personally reviewed following labs and imaging studies  CBC: Recent Labs  Lab 05/08/24 1422  WBC 12.1*  NEUTROABS 7.9*  HGB 14.3  HCT 43.2  MCV 87.4  PLT 250   Basic Metabolic Panel: Recent Labs  Lab 05/08/24 1422  NA 130*  K 4.0  CL 98  CO2 24  GLUCOSE 150*  BUN 17  CREATININE 1.34*  CALCIUM  9.7   GFR: Estimated Creatinine Clearance: 85.3 mL/min (A) (by C-G formula based on SCr of 1.34 mg/dL (H)). Liver Function  Tests: Recent Labs  Lab 05/08/24 1559  AST 23  ALT 15  ALKPHOS 67  BILITOT 1.3*  PROT 7.5  ALBUMIN 3.1*   Recent Labs  Lab 05/08/24 1559  LIPASE 25   No results for input(s): AMMONIA in the last 168 hours. Coagulation Profile: No results for input(s): INR, PROTIME in the last 168 hours. Cardiac Enzymes: No results for input(s): CKTOTAL, CKMB, CKMBINDEX, TROPONINI in the last 168 hours. BNP (last 3 results) No results for input(s): PROBNP in the last 8760 hours. HbA1C: No results for input(s): HGBA1C in the last 72 hours. CBG: No results for input(s): GLUCAP in the last 168 hours. Lipid Profile: No results for input(s): CHOL, HDL, LDLCALC, TRIG, CHOLHDL, LDLDIRECT in the last 72 hours. Thyroid  Function Tests: No results for input(s): TSH, T4TOTAL, FREET4, T3FREE, THYROIDAB in the last 72 hours. Anemia Panel: No results for input(s): VITAMINB12, FOLATE, FERRITIN, TIBC, IRON , RETICCTPCT in the last 72 hours. Urine analysis:    Component Value Date/Time   COLORURINE AMBER (A) 05/08/2024 1809   APPEARANCEUR HAZY (A) 05/08/2024 1809   LABSPEC 1.031 (H) 05/08/2024 1809   PHURINE 5.0 05/08/2024 1809   GLUCOSEU NEGATIVE 05/08/2024 1809   HGBUR NEGATIVE 05/08/2024 1809   BILIRUBINUR NEGATIVE 05/08/2024 1809   KETONESUR 5 (A) 05/08/2024 1809   PROTEINUR  30 (A) 05/08/2024 1809   UROBILINOGEN 0.2 06/07/2014 0151   NITRITE NEGATIVE 05/08/2024 1809   LEUKOCYTESUR NEGATIVE 05/08/2024 1809    Radiological Exams on Admission: DG Foot Complete Left Result Date: 05/08/2024 EXAM: 3 or more VIEW(S) XRAY OF THE LEFT FOOT 05/08/2024 04:51:13 PM COMPARISON: 11/15/2023 CLINICAL HISTORY: Diabetic foot wound, eval for e/o osteomyelitis. FINDINGS: BONES AND JOINTS: Previous resection of the great toe. Chronic mixed lucency and sclerosis involving the second metatarsal head. No frank erosions or bone destruction. No fracture. SOFT TISSUES:  Questionable wound involving the plantar aspect of the forefoot, seen only on the lateral view. Soft tissue edema of the forefoot. Advanced vascular calcifications. IMPRESSION: 1. No radiographic findings of osteomyelitis. 2. Chronic mixed lucency and sclerosis involving the second metatarsal head. 3. Questionable wound involving the plantar aspect of the forefoot, seen only on the lateral view. Soft tissue edema of the forefoot. Electronically signed by: Andrea Gasman MD 05/08/2024 05:16 PM EDT RP Workstation: [id number]   DG Chest Portable 1 View Result Date: 05/08/2024 CLINICAL DATA:  Cough. EXAM: PORTABLE CHEST 1 VIEW COMPARISON:  01/22/2021. FINDINGS: The heart size and mediastinal contours are within normal limits. 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 No focal consolidation, pleural effusion, or pneumothorax. Partially visualized cervical fusion hardware. No acute osseous abnormality. IMPRESSION: 1. No acute cardiopulmonary findings. 2. 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 Electronically Signed   By: Harrietta Sherry M.D.   On: 05/08/2024 15:08   Data Reviewed for HPI: Relevant notes from primary care and specialist visits, past discharge summaries as available in EHR, including Care Everywhere. Prior diagnostic testing as pertinent to current admission diagnoses Updated medications and problem lists for reconciliation ED course, including vitals, labs, imaging, treatment and response to treatment Triage notes, nursing and pharmacy notes and ED provider's notes Notable results as noted above in HPI      Assessment and Plan: * Sepsis due to left foot cellulitis (HCC) Infected plantar ulcer left foot in type I diabetic History of amputation great toe left foot Chronic immunosuppression with high risk of severe infection Sepsis criteria include fever, tachycardia  with source of infection Patient failed outpatient management with Levaquin  Continue cefepime  and vancomycin  and sepsis fluids Podiatry consult Will keep n.p.o. from midnight in case of need for OR debridement Pain control Continuing immunosuppressants, per [facility] transplant recommendations   Chronic cough Nausea with dry heaving Patient with 3 months of unresolved cough, treated with antibiotics No evidence of pneumonia or bronchitis on chest x-ray Antitussives as needed.  Albuterol  if wheezing  Empiric PPI for possible GERD Outpatient follow-up-can consider outpatient PFTs/referral to pulmonologist    Status post simultaneous kidney and pancreas transplant [facility]) Chronic immunosuppression Transplant MD, Dr. 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 Malvin  Advance Care Planning:   Code Status: Prior   Family Communication: none  Disposition Plan: Back to previous home environment  Severity of Illness: The appropriate patient status for this patient is INPATIENT. Inpatient status is judged to be reasonable and necessary in order to provide the required intensity of service to ensure the patient's safety. The  patient's presenting symptoms, physical exam findings, and initial radiographic and laboratory data in the context of their chronic comorbidities is felt to place them at high risk for further clinical deterioration. Furthermore, it is not anticipated that the patient will be medically stable for discharge from the hospital within 2 midnights of admission.   * I certify that at the point of admission it is my clinical judgment  that the patient will require inpatient hospital care spanning beyond 2 midnights from the point of admission due to high intensity of service, high risk for further deterioration and high frequency of surveillance required.*  Author: Delayne LULLA Solian, MD 05/08/2024 9:17 PM  For on call review [email or web address].

## 2024-05-08 NOTE — Assessment & Plan Note (Signed)
 CPAP at bedtime if desired

## 2024-05-08 NOTE — Consult Note (Signed)
 ED Pharmacy Antibiotic Sign Off An antibiotic consult was received from an ED provider for vancomycin  per pharmacy dosing for sepsis / DFI. A chart review was completed to assess appropriateness.   The following one time order(s) were placed:  --Vancomycin  2 g IV  Further antibiotic and/or antibiotic pharmacy consults should be ordered by the admitting provider if indicated.   Thank you for allowing pharmacy to be a part of this patient's care.   Marolyn KATHEE Mare, [facility]  05/08/24 4:20 PM

## 2024-05-08 NOTE — ED Provider Notes (Signed)
 [facility] Provider Note    Event Date/Time   First MD Initiated Contact with Patient 05/08/24 1506     (approximate)   History   Pneumonia  Pt reports having pneumonia with symptoms for 2 months. 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    HPI [name] is a 53 y.o. 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  Over the past several days he has become more fatigued than usual, dry heaving.  Is also noted development of rash to his left foot spreading from the site of a known diabetic foot wound.  Had recently been started on cefdinir  for presumed upper respiratory infection, took about 2.5 days of this but then lost the medication.  Had also recently been started on levofloxacin  for management of his left foot wound, he is actively taking this. - No significant abdominal pain.  Does note that he has had very little p.o. intake over the past 3-4 days and due to this has not urinated much.   Per chart review, last seen in clinic on 05/04/2024 complaining of cough for about 1 month that was noted to be worsening.  Also notes fever and chills.  COVID/flu/RSV swab negative.  Chest x-ray not showing infiltrates.  Empirically started on cefdinir .     Physical Exam   Triage Vital Signs: ED Triage Vitals  Encounter Vitals Group     BP 05/08/24 1418 139/73     Girls Systolic BP Percentile --      Girls Diastolic BP Percentile --      Boys Systolic BP Percentile --      Boys Diastolic BP Percentile --      Pulse Rate 05/08/24 1418 99     Resp 05/08/24 1418 20     Temp 05/08/24 1418 100.3 F (37.9 C)     Temp Source  05/08/24 1418 Oral     SpO2 05/08/24 1418 96 %     Weight 05/08/24 1419 253 lb (114.8 kg)     Height 05/08/24 1419 6' (1.829 m)     Head Circumference --      Peak Flow --      Pain Score 05/08/24 1419 8     Pain Loc --      Pain Education --      Exclude from Growth Chart --     Most recent vital signs: Vitals:   05/08/24 2244 05/08/24 2248  BP: (!) 148/79 121/72  Pulse: 80 84  Resp: 18 18  Temp: 98.9 F (37.2 C) 98.4 F (36.9 C)  SpO2: 95% 99%     General: Awake, no distress.  CV:  Good peripheral perfusion. RRR, RP 2+ Resp:  Normal effort. CTAB Abd:  No distention. Nontender to deep palpation throughout LLE:   Foot wound over plantar aspect of distal third, fourth, fifth metatarsals.  Mild purulence.  Erythema spreading over dorsum of foot up to ankle.  No crepitus.  No necrotic tissue.   ED Results / Procedures / Treatments   Labs (all labs ordered are listed, but only abnormal results are displayed) Labs Reviewed  CBC WITH DIFFERENTIAL/PLATELET - Abnormal; Notable for the following components:  Result Value   WBC 12.1 (*)    Neutro Abs 7.9 (*)    Monocytes Absolute 1.4 (*)    All other components within normal limits  BASIC METABOLIC PANEL WITH GFR - Abnormal; Notable for the following components:   Sodium 130 (*)    Glucose, Bld 150 (*)    Creatinine, Ser 1.34 (*)    All other components within normal limits  URINALYSIS, W/ REFLEX TO CULTURE (INFECTION SUSPECTED) - Abnormal; Notable for the following components:   Color, Urine AMBER (*)    APPearance HAZY (*)    Specific Gravity, Urine 1.031 (*)    Ketones, ur 5 (*)    Protein, ur 30 (*)    All other components within normal limits  HEPATIC FUNCTION PANEL - Abnormal; Notable for the following components:   Albumin 3.1 (*)    Total Bilirubin 1.3 (*)    Bilirubin, Direct 0.3 (*)    Indirect Bilirubin 1.0 (*)    All other components within normal limits  RESPIRATORY PANEL BY PCR  AEROBIC CULTURE W  GRAM STAIN (SUPERFICIAL SPECIMEN)  CULTURE, BLOOD (ROUTINE X 2)  CULTURE, BLOOD (ROUTINE X 2)  LACTIC ACID, PLASMA  LIPASE, BLOOD  PROTIME-INR  HIV ANTIBODY (ROUTINE TESTING W REFLEX)  BASIC METABOLIC PANEL WITH GFR  CBC     EKG  Ecg = rhythm, rate 93, no gross ST elevation or depression, no significant repolarization abnormality, left axis deviation present.  No clear evidence of ischemia or arrhythmia on my interpretation.   RADIOLOGY Chest x-ray reviewed, no acute pathology identified. X-ray of the foot with no evidence of osteomyelitis.  Radiology reports reviewed.   PROCEDURES:  Critical Care performed: Yes, see critical care procedure note(s)  .Critical Care  Performed by: Clarine Ozell LABOR, MD Authorized by: Clarine Ozell LABOR, MD   Critical care provider statement:    Critical care time (minutes):  30   Critical care time was exclusive of:  Separately billable procedures and treating other patients   Critical care was necessary to treat or prevent imminent or life-threatening deterioration of the following conditions:  Sepsis   Critical care was time spent personally by me on the following activities:  Development of treatment plan with patient or surrogate, discussions with consultants, evaluation of patient's response to treatment, examination of patient, ordering and review of laboratory studies, ordering and review of radiographic studies, ordering and performing treatments and interventions, pulse oximetry, re-evaluation of patient's condition and review of old charts   I assumed direction of critical care for this patient from another provider in my specialty: no     Care discussed with: admitting provider      MEDICATIONS ORDERED IN ED: Medications  pantoprazole  (PROTONIX ) injection 40 mg (40 mg Intravenous Given 05/08/24 2220)  lactated ringers  infusion (150 mL/hr Intravenous New Bag/Given 05/08/24 2301)  acetaminophen  (TYLENOL ) tablet 650 mg (has no  administration in time range)    Or  acetaminophen  (TYLENOL ) suppository 650 mg (has no administration in time range)  HYDROcodone -acetaminophen  (NORCO/VICODIN) 5-325 MG per tablet 1-2 tablet (has no administration in time range)  ondansetron  (ZOFRAN ) tablet 4 mg (4 mg Oral Given 05/08/24 2309)    Or  ondansetron  (ZOFRAN ) injection 4 mg ( Intravenous See Alternative 05/08/24 2309)  guaiFENesin  (MUCINEX ) 12 hr tablet 600 mg (600 mg Oral Given 05/08/24 2220)  albuterol  (PROVENTIL ) (2.5 MG/3ML) 0.083% nebulizer solution 2.5 mg (has no administration in time range)  aspirin  EC tablet 81 mg (has no administration in  time range)  atorvastatin  (LIPITOR ) tablet 80 mg (has no administration in time range)  carvedilol  (COREG ) tablet 3.125 mg (3.125 mg Oral Given 05/08/24 2220)  predniSONE  (DELTASONE ) tablet 5 mg (has no administration in time range)  tacrolimus  (PROGRAF ) capsule 1.5 mg (has no administration in time range)    And  tacrolimus  (PROGRAF ) capsule 1 mg (1 mg Oral Given 05/08/24 2220)  ceFEPIme  (MAXIPIME ) 2 g in sodium chloride  0.9 % 100 mL IVPB (2 g Intravenous New Bag/Given 05/08/24 2304)  vancomycin  (VANCOREADY) IVPB 1250 mg/250 mL (1,250 mg Intravenous New Bag/Given 05/08/24 2346)  mycophenolate  (CELLCEPT ) capsule 500 mg (500 mg Oral Given 05/08/24 2342)  sodium chloride  0.9 % bolus 1,000 mL (0 mLs Intravenous Stopped 05/08/24 1701)  acetaminophen  (TYLENOL ) tablet 1,000 mg (1,000 mg Oral Given 05/08/24 1610)  ondansetron  (ZOFRAN ) injection 4 mg (4 mg Intravenous Given 05/08/24 1611)  ceFEPIme  (MAXIPIME ) 2 g in sodium chloride  0.9 % 100 mL IVPB (0 g Intravenous Stopped 05/08/24 1700)  vancomycin  (VANCOREADY) IVPB 2000 mg/400 mL (0 mg Intravenous Stopped 05/08/24 1947)  alum & mag hydroxide-simeth (MAALOX/MYLANTA) 200-200-20 MG/5ML suspension 30 mL (30 mLs Oral Given 05/08/24 1808)    And  lidocaine  (XYLOCAINE ) 2 % viscous mouth solution 15 mL (15 mLs Oral Given 05/08/24 1808)  famotidine  (PEPCID ) tablet 20 mg (20  mg Oral Given 05/08/24 1808)     IMPRESSION / MDM / ASSESSMENT AND PLAN / ED COURSE  I reviewed the triage vital signs and the nursing notes.                              DDX/MDM/AP: Differential diagnosis includes, but is not limited to, pneumonia, patient does have obvious cellulitis and soft tissue infection of left foot at site of known diabetic foot ulcer evident and I do suspect this is likely source of infection.  Consider UTI.  Do not suspect acute intra-abdominal pathology at this time given very reassuring exam.  I am very concerned about this patient's temperature of 100.3 Fahrenheit orally given his known transplant status on immunosuppression and I am concerned about the possibility of evolving sepsis.  Plan: - Labs - Chest x-ray - X-ray left foot - IV fluid --will start gently with 1 L - Zofran , Tylenol  - Anticipate need for admission, will discuss with [facility] where patient's transplant service is to determine appropriate disposition  Patient's presentation is most consistent with acute presentation with potential threat to life or bodily function.  The patient is on the cardiac monitor to evaluate for evidence of arrhythmia and/or significant heart rate changes.  ED course below.  Workup with leukocytosis, patient febrile here as well, concern for sepsis.  Treated with broad-spectrum antibiotics.  Discussed with kidney transplant service at [facility], no indication for transfer, stable for treatment at facility.  Admitted to medicine service, can consider podiatry consultation as needed in the morning.  Give modified reduced fluid bolus of 1000 cc IV fluid given.  Not meeting septic shock criteria and normotensive here.  Clinical Course as of 05/09/24 0004  Tue May 08, 2024  1510 CBC with leukocytosis 12 [MM]  1510 BMP with mild hyponatremia that is within patient's baseline range [MM]  1510 Lactate wnl [MM]  1512 Chest x-ray reviewed by myself, no obvious  focal consolidation  CXR: IMPRESSION: 1. No acute cardiopulmonary findings. 2. 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   [MM]  1705  Hepatobiliary labs reviewed, overall unremarkable [MM]  1844 Urinalysis equivocal, some pyuria however.  Nitrite and leuk esterase negative.  No significant squamous cells.  No bacteria seen. [MM]  1845 ED secretary reaching out to [facility] kidney transplant service to discuss whether they would like to repeat treat patient or he should continue with treatment at our hospital [MM]  1853 Discussed with [facility] transfer center, they will call me back shortly [MM]  1944 D/w Dr. Adina Lever of nephrology transplant service at [facility] -Spectrum antibiotics are fine, can use Zosyn  if needed -Feels comfortable with patient being cared for at our facility, no indication for transfer at this time.  Is available over the next several days as needed for any questions that may arise, feel free to reach him via the transfer center. [MM]  1953 Hospitalist consult order placed [MM]    Clinical Course User Index [MM] Clarine Ozell LABOR, MD     FINAL CLINICAL IMPRESSION(S) / ED DIAGNOSES   Final diagnoses:  Sepsis with acute organ dysfunction without septic shock, due to unspecified organism, unspecified organ dysfunction type (HCC)  Type 1 diabetes mellitus with diabetic foot infection (HCC)  Cellulitis of left foot     Rx / DC Orders   ED Discharge Orders     None        Note:  This document was prepared using Dragon voice recognition software and may include unintentional dictation errors.   Clarine Ozell LABOR, MD 05/09/24 [phone]

## 2024-05-08 NOTE — Progress Notes (Signed)
 Pharmacy Antibiotic Note  [name] is a 53 y.o. male admitted on 05/08/2024 with cellulitis.  Pharmacy has been consulted for Cefepime  & Vancomycin  dosing.  Plan: Cefepime  2 gm q8hr per indication & renal fxn.  Pt given Vancomycin  2000 mg once. Vancomycin  1250 mg IV Q 12 hrs. Goal AUC 400-550. Expected AUC: 474 SCr used: 1.34,  Pharmacy will continue to follow and will adjust abx dosing whenever warranted.  Temp (24hrs), Avg:99.3 F (37.4 C), Min:98.3 F (36.8 C), Max:100.3 F (37.9 C)   Recent Labs  Lab 05/08/24 1422  WBC 12.1*  CREATININE 1.34*  LATICACIDVEN 1.5    Estimated Creatinine Clearance: 85.3 mL/min (A) (by C-G formula based on SCr of 1.34 mg/dL (H)).    Allergies  Allergen Reactions   Sulfa  Antibiotics Anaphylaxis and Swelling    Pt is not allergic to iodine , has had iodine  in the past w/o premeds and w/ no problems   Oxycontin  [Oxycodone  Hcl] Nausea And Vomiting   Penicillins Other (See Comments)    Tolerated 1st generation cephalosporin (CEFAZOLIN ) on 05/29/2020 & 06/30/2022 without documented ADRs.   Possible reaction many years ago per patient.  Has had PCN without issues since time of reaction.    Prednisone  Other (See Comments)    Patient is diabetic, runs sugar up     Shellfish-Derived Products Swelling    Eyes swell with shellfish Pt is not allergic to iodine , has had iodine  in the past w/o premeds and w/ no problem   Pregabalin  Other (See Comments)    Muscle twitching and jerks    Antimicrobials this admission: 8/05 Cefepime  >>  8/05 Vancomycin  >>   Microbiology results: 8/05 BCx: Pending 8/05 AerobicCx (L Foot): Pending.  Thank you for allowing pharmacy to be a part of this patient's care.  Rankin CANDIE Dills, PharmD, [facility] 05/08/2024 9:34 PM

## 2024-05-08 NOTE — ED Triage Notes (Signed)
 Pt reports having pneumonia with symptoms for 2 months. 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

## 2024-05-08 NOTE — Assessment & Plan Note (Signed)
 Chronic immunosuppression Transplant MD, Dr. Adina Lever at [facility] was contacted and said okay to continue all immunosuppressants Continue tacrolimus , prednisone  and CellCept 

## 2024-05-08 NOTE — Assessment & Plan Note (Signed)
 Continue Coreg

## 2024-05-09 ENCOUNTER — Inpatient Hospital Stay

## 2024-05-09 DIAGNOSIS — L97522 Non-pressure chronic ulcer of other part of left foot with fat layer exposed: Secondary | ICD-10-CM | POA: Diagnosis not present

## 2024-05-09 DIAGNOSIS — M869 Osteomyelitis, unspecified: Secondary | ICD-10-CM

## 2024-05-09 DIAGNOSIS — M86172 Other acute osteomyelitis, left ankle and foot: Secondary | ICD-10-CM

## 2024-05-09 LAB — CBC
HCT: 40.5 % (ref 39.0–52.0)
Hemoglobin: 13 g/dL (ref 13.0–17.0)
MCH: 28.5 pg (ref 26.0–34.0)
MCHC: 32.1 g/dL (ref 30.0–36.0)
MCV: 88.8 fL (ref 80.0–100.0)
Platelets: 218 K/uL (ref 150–400)
RBC: 4.56 MIL/uL (ref 4.22–5.81)
RDW: 13.6 % (ref 11.5–15.5)
WBC: 9.4 K/uL (ref 4.0–10.5)
nRBC: 0 % (ref 0.0–0.2)

## 2024-05-09 LAB — BASIC METABOLIC PANEL WITH GFR
Anion gap: 5 (ref 5–15)
BUN: 15 mg/dL (ref 6–20)
CO2: 24 mmol/L (ref 22–32)
Calcium: 9 mg/dL (ref 8.9–10.3)
Chloride: 105 mmol/L (ref 98–111)
Creatinine, Ser: 1.13 mg/dL (ref 0.61–1.24)
GFR, Estimated: >60 mL/min (ref >60)
Glucose, Bld: 104 mg/dL — ABNORMAL HIGH (ref 70–99)
Potassium: 4.2 mmol/L (ref 3.5–5.1)
Sodium: 134 mmol/L — ABNORMAL LOW (ref 135–145)

## 2024-05-09 LAB — C-REACTIVE PROTEIN: CRP: 12.2 mg/dL — ABNORMAL HIGH (ref <1.0)

## 2024-05-09 LAB — HIV ANTIBODY (ROUTINE TESTING W REFLEX): HIV Screen 4th Generation wRfx: NONREACTIVE

## 2024-05-09 LAB — GLUCOSE, CAPILLARY: Glucose-Capillary: 98 mg/dL (ref 70–99)

## 2024-05-09 LAB — SEDIMENTATION RATE: Sed Rate: 47 mm/h — ABNORMAL HIGH (ref 0–20)

## 2024-05-09 MED ORDER — VANCOMYCIN HCL 750 MG/150ML IV SOLN
750.0000 mg | Freq: Three times a day (TID) | INTRAVENOUS | Status: DC
  Administered 2024-05-09 – 2024-05-11 (×7): 750 mg via INTRAVENOUS
  Filled 2024-05-09 (×8): qty 150

## 2024-05-09 MED ORDER — PANTOPRAZOLE SODIUM 40 MG PO TBEC
40.0000 mg | DELAYED_RELEASE_TABLET | Freq: Every day | ORAL | Status: DC
  Administered 2024-05-09 – 2024-05-12 (×4): 40 mg via ORAL
  Filled 2024-05-09 (×4): qty 1

## 2024-05-09 MED ORDER — LACTATED RINGERS IV SOLN: INTRAVENOUS | Status: DC

## 2024-05-09 NOTE — ED Provider Notes (Incomplete)
 [facility] Provider Note    Event Date/Time   First MD Initiated Contact with Patient 05/08/24 1506     (approximate)   History   Pneumonia  Pt reports having pneumonia with symptoms for 2 months. 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    HPI [name] is a 53 y.o. 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  Over the past several days he has become more fatigued than usual, dry heaving.  Is also noted development of rash to his left foot spreading from the site of a known diabetic foot wound.  Had recently been started on cefdinir  for presumed upper respiratory infection, took about 2.5 days of this but then lost the medication.  Had also recently been started on levofloxacin  for management of his left foot wound, he is actively taking this. - No significant abdominal pain.  Does note that he has had very little p.o. intake over the past 3-4 days and due to this has not urinated much.   Per chart review, last seen in clinic on 05/04/2024 complaining of cough for about 1 month that was noted to be worsening.  Also notes fever and chills.  COVID/flu/RSV swab negative.  Chest x-ray not showing infiltrates.  Empirically started on cefdinir .     Physical Exam   Triage Vital Signs: ED Triage Vitals  Encounter Vitals Group     BP 05/08/24 1418 139/73     Girls Systolic BP Percentile --      Girls Diastolic BP Percentile --      Boys Systolic BP Percentile --      Boys Diastolic BP Percentile --      Pulse Rate 05/08/24 1418 99     Resp 05/08/24 1418 20     Temp 05/08/24 1418 100.3 F (37.9 C)     Temp Source  05/08/24 1418 Oral     SpO2 05/08/24 1418 96 %     Weight 05/08/24 1419 253 lb (114.8 kg)     Height 05/08/24 1419 6' (1.829 m)     Head Circumference --      Peak Flow --      Pain Score 05/08/24 1419 8     Pain Loc --      Pain Education --      Exclude from Growth Chart --     Most recent vital signs: Vitals:   05/08/24 1418  BP: 139/73  Pulse: 99  Resp: 20  Temp: 100.3 F (37.9 C)  SpO2: 96%     General: Awake, no distress.  CV:  Good peripheral perfusion. RRR, RP 2+ Resp:  Normal effort. CTAB Abd:  No distention. Nontender to deep palpation throughout LLE:   Foot wound over plantar aspect of distal third, fourth, fifth metatarsals.  Mild purulence.  Erythema spreading over dorsum of foot up to ankle.  No crepitus.  No necrotic tissue.   ED Results / Procedures / Treatments   Labs (all labs ordered are listed, but only abnormal results are displayed) Labs Reviewed  CBC WITH DIFFERENTIAL/PLATELET - Abnormal; Notable for the following components:      Result Value   WBC 12.1 (*)  Neutro Abs 7.9 (*)    Monocytes Absolute 1.4 (*)    All other components within normal limits  BASIC METABOLIC PANEL WITH GFR - Abnormal; Notable for the following components:   Sodium 130 (*)    Glucose, Bld 150 (*)    Creatinine, Ser 1.34 (*)    All other components within normal limits  LACTIC ACID, PLASMA  LACTIC ACID, PLASMA     EKG  Ecg = rhythm, rate 93, no gross ST elevation or depression, no significant repolarization abnormality, left axis deviation present.  No clear evidence of ischemia or arrhythmia on my interpretation.   RADIOLOGY Chest x-ray reviewed, no acute pathology identified. X-ray of the foot with no evidence of osteomyelitis.  Radiology reports reviewed.   PROCEDURES:  Critical Care performed: Yes, see critical care procedure note(s)  .Critical Care  Performed by: Clarine Ozell LABOR, MD Authorized by: Clarine Ozell LABOR, MD   Critical care provider  statement:    Critical care time (minutes):  30   Critical care time was exclusive of:  Separately billable procedures and treating other patients   Critical care was necessary to treat or prevent imminent or life-threatening deterioration of the following conditions:  Sepsis   Critical care was time spent personally by me on the following activities:  Development of treatment plan with patient or surrogate, discussions with consultants, evaluation of patient's response to treatment, examination of patient, ordering and review of laboratory studies, ordering and review of radiographic studies, ordering and performing treatments and interventions, pulse oximetry, re-evaluation of patient's condition and review of old charts   I assumed direction of critical care for this patient from another provider in my specialty: no     Care discussed with: admitting provider      MEDICATIONS ORDERED IN ED: Medications - No data to display   IMPRESSION / MDM / ASSESSMENT AND PLAN / ED COURSE  I reviewed the triage vital signs and the nursing notes.                              DDX/MDM/AP: Differential diagnosis includes, but is not limited to, pneumonia, patient does have obvious cellulitis and soft tissue infection of left foot at site of known diabetic foot ulcer evident and I do suspect this is likely source of infection.  Consider UTI.  Do not suspect acute intra-abdominal pathology at this time given very reassuring exam.  I am very concerned about this patient's temperature of 100.3 Fahrenheit orally given his known transplant status on immunosuppression and I am concerned about the possibility of evolving sepsis.  Plan: - Labs - Chest x-ray - X-ray left foot - IV fluid --will start gently with 1 L - Zofran , Tylenol  - Anticipate need for admission, will discuss with [facility] where patient's transplant service is to determine appropriate disposition  Patient's presentation is most  consistent with acute presentation with potential threat to life or bodily function.  The patient is on the cardiac monitor to evaluate for evidence of arrhythmia and/or significant heart rate changes.  ED course below.  Workup with leukocytosis, patient febrile here as well, concern for sepsis.  Treated with broad-spectrum antibiotics.  Discussed with kidney transplant service at [facility], no indication for transfer, stable for treatment at facility.  Admitted to medicine service, can consider podiatry consultation as needed in the morning.  Clinical Course as of 05/09/24 0002  Tue May 08, 2024  1510  CBC with leukocytosis 12 [MM]  1510 BMP with mild hyponatremia that is within patient's baseline range [MM]  1510 Lactate wnl [MM]  1512 Chest x-ray reviewed by myself, no obvious focal consolidation  CXR: IMPRESSION: 1. No acute cardiopulmonary findings. 2. 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   [MM]  1705 Hepatobiliary labs reviewed, overall unremarkable [MM]  1844 Urinalysis equivocal, some pyuria however.  Nitrite and leuk esterase negative.  No significant squamous cells.  No bacteria seen. [MM]  1845 ED secretary reaching out to [facility] kidney transplant service to discuss whether they would like to repeat treat patient or he should continue with treatment at our hospital [MM]  1853 Discussed with [facility] transfer center, they will call me back shortly [MM]  1944 D/w Dr. Adina Lever of nephrology transplant service at [facility] -Spectrum antibiotics are fine, can use Zosyn  if needed -Feels comfortable with patient being cared for at our facility, no indication for transfer at this time.  Is available over the next several days as needed for any questions that may arise, feel free to reach him via the transfer center. [MM]  1953 Hospitalist consult order placed [MM]    Clinical Course User Index [MM] Clarine Ozell LABOR, MD     FINAL CLINICAL IMPRESSION(S) / ED DIAGNOSES   Final diagnoses:  None     Rx / DC Orders   ED Discharge Orders     None        Note:  This document was prepared using Dragon voice recognition software and may include unintentional dictation errors.

## 2024-05-09 NOTE — Progress Notes (Signed)
 Pharmacy Antibiotic Note  [name] is a 53 y.o. male admitted on 05/08/2024 with cellulitis.  Pharmacy has been consulted for Cefepime  & Vancomycin  dosing.  8/6: Possible OR debridement plans following podiatry consult . Patient remains afebrile with no leukocytosis. Renal function has improved overnight. Scr decreased from 1.34 to 1.13.   Plan: Continue Cefepime  2 gm q8hr per indication & renal fxn. Change vancomycin  to 750 mg q8h (eAUC 523.7, Scr 1.13, IBW used, Vd 0.5 L/kg) Goal AUC 400-550 Monitor renal function, clinical status, culture data, and LOT    Pharmacy will continue to follow and will adjust abx dosing whenever warranted.  Temp (24hrs), Avg:98.7 F (37.1 C), Min:97.7 F (36.5 C), Max:100.3 F (37.9 C)   Recent Labs  Lab 05/08/24 1422 05/09/24 0447  WBC 12.1* 9.4  CREATININE 1.34* 1.13  LATICACIDVEN 1.5  --     Estimated Creatinine Clearance: 101.2 mL/min (by C-G formula based on SCr of 1.13 mg/dL).    Allergies  Allergen Reactions   Sulfa  Antibiotics Anaphylaxis and Swelling    Pt is not allergic to iodine , has had iodine  in the past w/o premeds and w/ no problems   Oxycontin  [Oxycodone  Hcl] Nausea And Vomiting   Penicillins Other (See Comments)    Tolerated 1st generation cephalosporin (CEFAZOLIN ) on 05/29/2020 & 06/30/2022 without documented ADRs.   Possible reaction many years ago per patient.  Has had PCN without issues since time of reaction.    Prednisone  Other (See Comments)    Patient is diabetic, runs sugar up     Shellfish-Derived Products Swelling    Eyes swell with shellfish Pt is not allergic to iodine , has had iodine  in the past w/o premeds and w/ no problem   Pregabalin  Other (See Comments)    Muscle twitching and jerks    Antimicrobials this admission: 8/05 Cefepime  >>  8/05 Vancomycin  >>   Microbiology results: 8/05 BCx: NGTD 8/05 AerobicCx (L Foot): few staph aureus, susceptibilities to follow   Thank you for  allowing pharmacy to be a part of this patient's care.   Ransom Blanch PGY-1 Pharmacy Resident  [facility]  05/09/2024 12:20 PM

## 2024-05-09 NOTE — Consult Note (Signed)
 Hospital Consult    Reason for Consult:  Left Foot non healing wound Requesting Physician:  Dr Matthew Honour MD  MRN #:  [id number]  History of Present Illness: This is a 53 y.o. male [name] is a 53 y.o. 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  Of note, patient has a prior left great toe amputation related to his diabetic neuropathy.  Separately, he also reports a cough for the past 2 months   Has been on different antibiotics for both foot wound and cough, most recently Levaquin  without improvement.  He has now developed fever, chills, fatigue and nausea . denies dysuria but has been urinating less than usual.  Denies change in bowel habits.   Vascular surgery has been asked to evaluate for ongoing foot ulcer/wound that has been non healing.   Past Medical History:  Diagnosis Date   Cellulitis    Dialysis patient [facility])    M, W, F   DKA (diabetic ketoacidosis) (HCC)    Esophageal dysphagia    ESRD (end stage renal disease) (HCC)    Gastroparesis    Herniated cervical disc    HTN (hypertension)    Hypercholesteremia    Hypothyroidism    Morbid obesity (HCC)    OSA (obstructive sleep apnea)    Peritoneal dialysis status (HCC)    PONV (postoperative nausea and vomiting)    after peritoneal dialysis catheter was placed   Presence of insulin  pump    a.) parenteral insulin  admin with pen as of 09/20/2022; no using pump   S/P cardiac cath    a. 10-15 yrs ago at [facility] due to tachycardia, reportedly normal. b. Normal ETT 03/2012.   Sepsis (HCC)    Sinus tachycardia    a. 24-hr Holter 03/2012 - SR, occ  PVCs, no VT, avg HR 96bpm.   TIA (transient ischemic attack)    Type 1 diabetes mellitus [facility])     Past Surgical History:  Procedure Laterality Date   A/V FISTULAGRAM Left 09/14/2022   Procedure: A/V Fistulagram;  Surgeon: Matthew Cordella MATSU, MD;  Location: [facility] INVASIVE CV LAB;  Service: Cardiovascular;  Laterality: Left;   AV FISTULA PLACEMENT Left 06/30/2022   Procedure: ARTERIOVENOUS (AV) FISTULA CREATION (BRACHIALCEPHALIC);  Surgeon: Matthew Cordella MATSU, MD;  Location: [facility] ORS;  Service: Vascular;  Laterality: Left;   BIOPSY  10/03/2019   Procedure: BIOPSY;  Surgeon: Matthew Gustav GAILS, MD;  Location: [facility] ENDOSCOPY;  Service: Endoscopy;;   CATARACT EXTRACTION Bilateral    cervical neck fusion     COLONOSCOPY WITH PROPOFOL  N/A 01/02/2021   Procedure: COLONOSCOPY WITH PROPOFOL ;  Surgeon: Matthew Carmine, MD;  Location: [facility];  Service: Endoscopy;  Laterality: N/A;  priority 4 DIABETIC needs potassium draw   ESOPHAGOGASTRODUODENOSCOPY (EGD) WITH PROPOFOL  N/A 10/03/2019   Procedure: ESOPHAGOGASTRODUODENOSCOPY (EGD) WITH PROPOFOL ;  Surgeon: Matthew Gustav GAILS, MD;  Location: [facility] ENDOSCOPY;  Service: Endoscopy;  Laterality: N/A;   EYE SURGERY Bilateral    for floaters   HERNIA REPAIR     bilateral   LEFT HEART CATH AND CORONARY ANGIOGRAPHY Left 10/16/2021   Procedure: LEFT HEART CATH AND CORONARY ANGIOGRAPHY;  Surgeon: Matthew Deatrice LABOR,  MD;  Location: [facility] INVASIVE CV LAB;  Service: Cardiovascular;  Laterality: Left;   LEFT HEART CATHETERIZATION WITH CORONARY ANGIOGRAM N/A 12/18/2013   Procedure: LEFT HEART CATHETERIZATION WITH CORONARY ANGIOGRAM;  Surgeon: Matthew M [place], MD;  Location: [facility] CATH LAB;  Service: Cardiovascular;  Laterality: N/A;   PERITONEAL CATHETER INSERTION     PERITONEAL CATHETER REMOVAL     POLYPECTOMY  01/02/2021   Procedure: POLYPECTOMY;  Surgeon: Matthew Carmine, MD;  Location: [facility];  Service: Endoscopy;;   REVISON OF ARTERIOVENOUS FISTULA  Left 09/24/2022   Procedure: REVISON OF ARTERIOVENOUS FISTULA (BRACHIAL CEPHALIC WITH ARTEGRAFT);  Surgeon: Matthew Cordella MATSU, MD;  Location: [facility] ORS;  Service: Vascular;  Laterality: Left;    Allergies  Allergen Reactions   Sulfa  Antibiotics Anaphylaxis and Swelling    Pt is not allergic to iodine , has had iodine  in the past w/o premeds and w/ no problems   Oxycontin  [Oxycodone  Hcl] Nausea And Vomiting   Penicillins Other (See Comments)    Tolerated 1st generation cephalosporin (CEFAZOLIN ) on 05/29/2020 & 06/30/2022 without documented ADRs.   Possible reaction many years ago per patient.  Has had PCN without issues since time of reaction.    Prednisone  Other (See Comments)    Patient is diabetic, runs sugar up     Shellfish-Derived Products Swelling    Eyes swell with shellfish Pt is not allergic to iodine , has had iodine  in the past w/o premeds and w/ no problem   Pregabalin  Other (See Comments)    Muscle twitching and jerks    Prior to Admission medications   Medication Sig Start Date End Date Taking? Authorizing Provider  albuterol  (VENTOLIN  HFA) 108 (90 Base) MCG/ACT inhaler 2 puffs every 6 (six) hours as needed for wheezing. 04/03/24 04/03/25 Yes [provider]  aspirin  EC 81 MG tablet Take 81 mg by mouth daily. 10/08/22  Yes [provider]  atorvastatin  (LIPITOR ) 80 MG tablet Take 1 tablet (80 mg total) by mouth daily. 10/29/22  Yes   carvedilol  (COREG ) 3.125 MG tablet Take 3.125 mg by mouth every 12 (twelve) hours. 05/08/24  Yes [provider]  cefdinir  (OMNICEF ) 300 MG capsule Take 1 capsule (300 mg total) by mouth 2 (two) times daily for 10 days. 05/04/24  Yes   Cholecalciferol 50 MCG (2000 UT) TABS Take 5,000 Units by mouth daily. 06/23/23 06/22/24 Yes [provider]  levofloxacin  (LEVAQUIN ) 500 MG tablet Take 1 tablet (500 mg total) by mouth daily for 10 days. 04/13/24  Yes   Multiple Vitamin (MULTI-VITAMIN) tablet Take 1 tablet by mouth  daily. 06/23/23 06/22/24 Yes [provider]  mycophenolate  (CELLCEPT ) 250 MG capsule Take 2 capsules (500 mg total) by mouth every 12 (twelve) hours. 05/24/23  Yes   predniSONE  (DELTASONE ) 5 MG tablet Take 1 tablet (5 mg total) by mouth daily. 12/24/22  Yes   tacrolimus  (PROGRAF ) 0.5 MG capsule Take 3 capsules (1.5 mg total) by mouth every morning AND 3 capsules (1.5 mg total) at bedtime. Patient taking differently: Take 3 capsules (1.5 mg total) by mouth every morning AND 2 capsules (1 mg total) at bedtime. 05/24/23  Yes   atorvastatin  (LIPITOR ) 80 MG tablet Take 1 tablet (80 mg total) by mouth once daily 08/15/23     carvedilol  (COREG ) 6.25 MG tablet Take 1 tablet (6.25 mg total) by mouth 2 (two) times daily Patient not taking: Reported on 05/08/2024 06/20/23     carvedilol  (COREG ) 6.25 MG tablet Take 1 tablet (6.25 mg total)  by mouth 2 (two) times daily Patient not taking: Reported on 05/08/2024 08/15/23     doxycycline  (VIBRA -TABS) 100 MG tablet Take 1 tablet (100 mg total) by mouth 2 (two) times daily. Patient not taking: Reported on 05/08/2024 03/08/24   Janit Thresa HERO, DPM  mycophenolate  (CELLCEPT ) 250 MG capsule Take 4 capsules (1,000 mg total) by mouth every 12 (twelve) hours. Patient not taking: Reported on 05/08/2024 10/28/22     mycophenolate  (CELLCEPT ) 250 MG capsule Take 2 capsules (500 mg total) by mouth every 12 (twelve) hours. 08/15/23     predniSONE  (DELTASONE ) 5 MG tablet Take 1 tablet (5 mg total) by mouth once daily 08/15/23     tacrolimus  (PROGRAF ) 0.5 MG capsule Take 3 capsules (1.5 mg total) by mouth every morning AND 3 capsules (1.5 mg total) at bedtime. 08/15/23     amLODipine  (NORVASC ) 10 MG tablet Take 1 tablet (10 mg total) by mouth daily. 10/28/22 04/18/23    cloNIDine  (CATAPRES ) 0.2 MG tablet Take 1 tablet (0.2 mg total) by mouth 2 (two) times daily. 08/03/22 04/18/23    hydrALAZINE  (APRESOLINE ) 100 MG tablet Take 1 tablet (100 mg total) by mouth 3 (three) times daily 11/16/22  04/18/23    insulin  lispro (HUMALOG ) 100 UNIT/ML KwikPen Inject 50 Units into the skin daily in divided doses. Patient taking differently: Inject 40-45 Units into the skin 3 (three) times daily. Uses SS 09/09/22 04/18/23  Damian Therisa HERO, MD  minoxidil  (LONITEN ) 2.5 MG tablet Take 1 Tablet Oral as directed Patient taking differently: Take 2.5 mg by mouth every morning. 06/02/22 04/18/23    pantoprazole  (PROTONIX ) 40 MG tablet Take 1 tablet (40 mg total) by mouth once daily Patient taking differently: Take 40 mg by mouth every morning. 03/18/22 04/18/23    promethazine  (PHENERGAN ) 25 MG tablet Take 1/2 (12.5mg ) to 1 (25mg ) tab as needed for nausea and vomiting not controlled with Zofran  up to every 6 hours. 09/30/20 12/04/20  EarlyCamie BRAVO, NP    Social History   Socioeconomic History   Marital status: Married    Spouse name: Not on file   Number of children: Not on file   Years of education: Not on file   Highest education level: Not on file  Occupational History   Not on file  Tobacco Use   Smoking status: Never   Smokeless tobacco: Never  Vaping Use   Vaping status: Never Used  Substance and Sexual Activity   Alcohol use: No   Drug use: No   Sexual activity: Yes  Other Topics Concern   Not on file  Social History Narrative   Not on file   Social Drivers of Health   Financial Resource Strain: Low Risk  (04/09/2024)   Received from [facility]   Overall Financial Resource Strain (CARDIA)    Difficulty of Paying Living Expenses: Not very hard  Food Insecurity: No Food Insecurity (05/08/2024)   Hunger Vital Sign    Worried About Running Out of Food in the Last Year: Never true    Ran Out of Food in the Last Year: Never true  Transportation Needs: No Transportation Needs (05/08/2024)   PRAPARE - Administrator, Civil Service (Medical): No    Lack of Transportation (Non-Medical): No  Physical Activity: Not on file  Stress: Not on file  Social Connections:  Socially Integrated (05/08/2024)   Social Connection and Isolation Panel    Frequency of Communication with Friends and Family: More than three  times a week    Frequency of Social Gatherings with Friends and Family: Three times a week    Attends Religious Services: More than 4 times per year    Active Member of Clubs or Organizations: No    Attends Banker Meetings: 1 to 4 times per year    Marital Status: Married  Catering manager Violence: Not At Risk (05/08/2024)   Humiliation, Afraid, Rape, and Kick questionnaire    Fear of Current or Ex-Partner: No    Emotionally Abused: No    Physically Abused: No    Sexually Abused: No     Family History  Problem Relation Age of Onset   Hypertension Other    Coronary artery disease Other        Mother's side - both her side's grandparents died of heart disease (MIs)   Diabetes Other    Stroke Other        Paternal grandfather (82)   Prostate cancer Neg Hx    Bladder Cancer Neg Hx    Kidney cancer Neg Hx     ROS: Otherwise negative unless mentioned in HPI  Physical Examination  Vitals:   05/09/24 0810 05/09/24 1436  BP: (!) 156/63 134/63  Pulse: 76 75  Resp: 16 16  Temp: 97.7 F (36.5 C) 98.1 F (36.7 C)  SpO2: 97% 96%   Body mass index is 34.31 kg/m.  General:  WDWN in NAD Gait: Not observed HENT: WNL, normocephalic Pulmonary: normal non-labored breathing, without Rales, rhonchi,  wheezing Cardiac: regular, without  Murmurs, rubs or gallops; without carotid bruits Abdomen: Positive bowel sounds throughout, soft, NT/ND, no masses Skin: without rashes Vascular Exam/Pulses: Palpable pulses throughout, ll extremities are warm to touch.  Extremities: without ischemic changes, without Gangrene , without cellulitis; with open wounds;  Musculoskeletal: no muscle wasting or atrophy  Neurologic: A&O X 3;  No focal weakness or paresthesias are detected; speech is fluent/normal Psychiatric:  The pt has Normal  affect. Lymph:  Unremarkable  CBC    Component Value Date/Time   WBC 9.4 05/09/2024 0447   RBC 4.56 05/09/2024 0447   HGB 13.0 05/09/2024 0447   HGB 10.6 (L) 10/13/2021 1218   HCT 40.5 05/09/2024 0447   HCT 32.3 (L) 10/13/2021 1218   PLT 218 05/09/2024 0447   PLT 272 10/13/2021 1218   MCV 88.8 05/09/2024 0447   MCV 88 10/13/2021 1218   MCH 28.5 05/09/2024 0447   MCHC 32.1 05/09/2024 0447   RDW 13.6 05/09/2024 0447   RDW 15.2 10/13/2021 1218   LYMPHSABS 2.5 05/08/2024 1422   LYMPHSABS 1.3 10/13/2021 1218   MONOABS 1.4 (H) 05/08/2024 1422   EOSABS 0.2 05/08/2024 1422   EOSABS 0.4 10/13/2021 1218   BASOSABS 0.0 05/08/2024 1422   BASOSABS 0.1 10/13/2021 1218    BMET    Component Value Date/Time   NA 134 (L) 05/09/2024 0447   NA 136 10/13/2021 1218   K 4.2 05/09/2024 0447   CL 105 05/09/2024 0447   CO2 24 05/09/2024 0447   GLUCOSE 104 (H) 05/09/2024 0447   BUN 15 05/09/2024 0447   BUN 99 (HH) 10/13/2021 1218   CREATININE 1.13 05/09/2024 0447   CALCIUM  9.0 05/09/2024 0447   GFRNONAA >60 05/09/2024 0447   GFRAA 18 (L) 01/28/2020 0719    COAGS: Lab Results  Component Value Date   INR 1.1 05/08/2024   INR 0.90 08/28/2018   INR 1.07 09/07/2015     Non-Invasive Vascular Imaging:  EXAM: NONINVASIVE PHYSIOLOGIC VASCULAR STUDY OF BILATERAL LOWER EXTREMITIES   TECHNIQUE: Evaluation of both lower extremities were performed at rest, including calculation of ankle-brachial indices with single level pressure measurements and doppler recording.   COMPARISON:  None available.   FINDINGS: Right ABI:  Noncompressible   Left ABI:  Noncompressible   Right Lower Extremity: Posterior tibial and dorsalis pedis artery waveforms are monophasic.   Left Lower Extremity: Posterior tibial and dorsalis pedis artery waveforms are monophasic.   > 1.4 Non diagnostic secondary to incompressible vessel calcifications (medial arterial sclerosis of [place])    IMPRESSION: Nondiagnostic evaluation of the lower extremities due to incompressible vessels.  Statin:  Yes.   Beta Blocker:  Yes.   Aspirin :  Yes.   ACEI:  No. ARB:  No. CCB use:  Yes Other antiplatelets/anticoagulants:  No.    ASSESSMENT/PLAN: This is a 53 y.o. male who presents to [facility] emergency department for a non healing left foot plantar wound.  Patient has a distinct long medical history of pancreatic and kidney transplant.  Patient also endorses today that he has been approved by his insurance for hyperbaric oxygen treatment for his foot.  Therefore after a long discussion at the bedside which encompassed his past medical history, current bilateral lower extremity duplex ultrasounds with ABIs, his current foot wound/ulcer that is not healing, his prior treatment at [facility] he expresses he would rather not undergo a right lower extremity angiogram and put his kidney at risk due to dye load when he feels he has other options for healing his wounds.  Patient endorses he feels he has enough blood flow to heal things and that hyperbaric oxygen may be a better option first.  However he is inclined to let podiatry do a toe amputation or partial amputation at this time and continue IV antibiotics as part of his long-term treatment.  After reviewing the patient's bilateral lower extremity arterial duplex ultrasounds with ABIs the patient has acceptable waveforms but noncompressible vessels which I believe is due to his longstanding DM1 disease rather than extensive sclerosis of his right lower extremity.  And a long discussion with Dr. Marolyn [name] of podiatry it would be acceptable to do toe amputation first and come back to do an angiogram if needed if the patient shows they did not have enough blood flow to heal the surgical procedure.  At this time this plan agrees with what the patient would like to do.  Vascular surgery is available if needed postprocedure.  Please contact  us .   -I discussed the case in detail with Dr. Selinda Gu MD and he agrees with the plan.   [name] [facility] 05/09/2024 3:17 PM

## 2024-05-09 NOTE — Plan of Care (Signed)

## 2024-05-09 NOTE — Assessment & Plan Note (Addendum)
 No longer needs insulin  A1c 6.0 on 04/03/24

## 2024-05-09 NOTE — Progress Notes (Signed)
 PROGRESS NOTE    [name]  FMW:[id number] DOB: 1971/03/07 DOA: 05/08/2024 PCP: Rudolpho Norleen JONETTA, MD  Chief Complaint  Patient presents with   Pneumonia    Hospital Course:  [name] is a 53 y.o. 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  Of note, patient has a prior left great toe amputation related to his diabetic neuropathy..  Separately, he also reports a cough for the past 2 months   Has been on different antibiotics for both foot wound and cough, most recently Levaquin  without improvement.  He has now developed fever, chills, fatigue and nausea . denies dysuria but has been urinating less than usual.  Denies change in bowel habits. In the ED he had a temp of 100.3 and heart rate up to 99, normal BP and O2 sats in the high 90s on room air. Labs notable for WBC 12,000 and normal lactic acid of 1.5.  Respiratory viral panel pending. Glucose 150 with sodium 130 with normal anion gap. Creatinine 1.34(baseline 1.1  04/03/2024) Lipase and LFTs unremarkable Urinalysis showing few ketones with no infection EKG with sinus rhythm at 93 Foot x-ray with soft tissue edema left forefoot and possible plantar wound. Chest x-ray nonacute   The ED provider: Spoke with patient's transplant doctors at [facility] who recommended no need for transfer advised on treating with Zosyn .    Patient started on cefepime  and vancomycin , given a NS bolus  Subjective: Patient was examined at the bedside, new to me today.  States cough somewhat better but otherwise denies any other complaints today.  Seen by  podiatry   Objective: Vitals:   05/08/24 2248 05/09/24 0656 05/09/24 0810 05/09/24 1436  BP: 121/72 137/72 (!) 156/63 134/63  Pulse: 84 82 76 75  Resp: 18 18 16 16   Temp: 98.4 F (36.9 C) 98.4 F (36.9 C) 97.7 F (36.5 C) 98.1 F (36.7 C)  TempSrc:   Oral Oral  SpO2: 99% 94% 97% 96%  Weight:      Height:        Intake/Output Summary (Last 24 hours) at 05/09/2024 1813 Last data filed at 05/09/2024 1436 Gross per 24 hour  Intake 1400 ml  Output 1800 ml  Net -400 ml   Filed Weights   05/08/24 1419  Weight: 114.8 kg    Examination: Constitutional:      General: He is not in acute distress. HENT:     Head: Normocephalic and atraumatic.  Cardiovascular:     Rate and Rhythm: Normal rate and regular rhythm.     Heart sounds: Normal heart sounds.  Pulmonary:     Effort: Pulmonary effort is normal.     Breath sounds: Normal breath sounds.  Abdominal:     Palpations: Abdomen is soft.     Tenderness: There is no abdominal tenderness.  Musculoskeletal:     Comments: 2 cm in diameter ulcer plantar aspect left 4th and 5th metatarsals with small area of purulent drainage.  Redness extending along lateral edge of forefoot to dorsum of foot up to ankle..  Warmth over erythematous area going up lower leg See pic.  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)   Type 1 diabetes mellitus with diabetic foot infection (HCC)   Nausea   Chronic cough   Status post simultaneous kidney and pancreas transplant (HCC)   Type 1 diabetes s/p pancreas transplant 10/2022 (HCC)   Benign essential hypertension   Hypothyroidism   Presence of insulin  pump   Immunosuppression (HCC)   OSA (obstructive sleep apnea)   Ulcer of left foot with fat layer exposed (HCC)   Pyogenic inflammation of bone (HCC)   Sepsis due to left foot  cellulitis Concern for Osteomyelitis at 5th Metatarsal head History of amputation great toe left foot Chronic immunosuppression with high risk of severe infection -Sepsis criteria include fever, tachycardia with source of infection -Patient failed outpatient management with Levaquin  -MRI Left foot fifth MTP compatible with osteomyelitis/septic arthritis.  No abscess.  Flattening of the second metatarsal wound findings suggestive of avascular necrosis -US  Arterial ABI nondiagnostic due to incompressible vessels -Continue IV cefepime  and vancomycin  -Seen by Podiatry, appreciate recs.  Discussed options including partial fifth ray amputation, does not want to proceed.  Also discussed possible debridement of the ulceration with bone biopsy of fifth metatarsal head to assess for osteomyelitis definitively and to guide antibiotic therapy.  Tentatively planned for Friday 08/08 - NPO PM - Refused angiogram, also considering potential transfer to [facility] -Vascular surgery consult pending -Wound care at bedside, weightbearing as tolerated - Pain control - Follow wound culture, blood culture   Chronic cough Nausea with dry heaving Patient with 3 months of unresolved cough, treated with antibiotics No evidence of pneumonia or bronchitis on chest x-ray Antitussives as needed.  Albuterol  if wheezing  Empiric PPI for possible GERD Outpatient follow-up-can consider outpatient PFTs/referral to pulmonologist  Status post simultaneous kidney and pancreas transplant (HCC) Chronic immunosuppression Transplant MD, Dr. Adina Lever at [facility] was contacted and said okay to continue all immunosuppressants Continue tacrolimus , prednisone  and CellCept   Type 1 diabetes s/p pancreas transplant 10/2022 [facility]) No longer needs insulin  A1c 6.0 on 04/03/24   OSA (obstructive sleep apnea) CPAP at bedtime if desired   Benign essential hypertension Continue Coreg   DVT prophylaxis: Held - possible OR   Code Status: Full  Code Disposition:  TBD  Consultants:  Treatment Team:  Consulting Physician: [name], DPM  Procedures:  TBD  Antimicrobials:  Anti-infectives (From admission, onward)    Start     Dose/Rate Route Frequency Ordered Stop   05/09/24 1200  vancomycin  (VANCOREADY) IVPB 750 mg/150 mL        750 mg 150 mL/hr over 60 Minutes Intravenous Every 8 hours 05/09/24 1107     05/09/24 0000  vancomycin  (VANCOREADY) IVPB 1250 mg/250 mL  Status:  Discontinued        1,250 mg 166.7 mL/hr over 90 Minutes Intravenous Every 12 hours 05/08/24 2142 05/09/24 1107   05/08/24 2200  ceFEPIme  (MAXIPIME ) 2 g in sodium chloride  0.9 % 100 mL IVPB        2 g 200 mL/hr over 30 Minutes Intravenous Every 8 hours 05/08/24 2142     05/08/24 1630  ceFEPIme  (MAXIPIME ) 2 g in sodium chloride  0.9 % 100 mL IVPB        2 g 200 mL/hr over 30 Minutes Intravenous  Once 05/08/24 1619 05/08/24 1700   05/08/24 1630  vancomycin  (VANCOREADY) IVPB 2000  mg/400 mL        2,000 mg 200 mL/hr over 120 Minutes Intravenous  Once 05/08/24 1619 05/08/24 1947       Data Reviewed: I have personally reviewed following labs and imaging studies CBC: Recent Labs  Lab 05/08/24 1422 05/09/24 0447  WBC 12.1* 9.4  NEUTROABS 7.9*  --   HGB 14.3 13.0  HCT 43.2 40.5  MCV 87.4 88.8  PLT 250 218   Basic Metabolic Panel: Recent Labs  Lab 05/08/24 1422 05/09/24 0447  NA 130* 134*  K 4.0 4.2  CL 98 105  CO2 24 24  GLUCOSE 150* 104*  BUN 17 15  CREATININE 1.34* 1.13  CALCIUM  9.7 9.0   GFR: Estimated Creatinine Clearance: 101.2 mL/min (by C-G formula based on SCr of 1.13 mg/dL). Liver Function Tests: Recent Labs  Lab 05/08/24 1559  AST 23  ALT 15  ALKPHOS 67  BILITOT 1.3*  PROT 7.5  ALBUMIN 3.1*   CBG: Recent Labs  Lab 05/09/24 0807  GLUCAP 98    Recent Results (from the past 240 hours)  Blood Culture (routine x 2)     Status: None (Preliminary result)   Collection Time: 05/08/24  3:55 PM   Specimen:  BLOOD  Result Value Ref Range Status   Specimen Description BLOOD RIGHT ANTECUBITAL  Final   Special Requests   Final    BOTTLES DRAWN AEROBIC AND ANAEROBIC Blood Culture results may not be optimal due to an inadequate volume of blood received in culture bottles   Culture   Final    NO GROWTH < 24 HOURS Performed at [facility], [address] [place], [place] [address]    Report Status PENDING  Incomplete  Blood Culture (routine x 2)     Status: None (Preliminary result)   Collection Time: 05/08/24  3:55 PM   Specimen: BLOOD  Result Value Ref Range Status   Specimen Description BLOOD BLOOD RIGHT FOREARM  Final   Special Requests   Final    BOTTLES DRAWN AEROBIC ONLY Blood Culture results may not be optimal due to an inadequate volume of blood received in culture bottles   Culture   Final    NO GROWTH < 24 HOURS Performed at [facility], [address] [place], [place] [address]    Report Status PENDING  Incomplete  Respiratory (~20 pathogens) panel by PCR     Status: None   Collection Time: 05/08/24  3:55 PM   Specimen: Nasopharyngeal Swab; Respiratory  Result Value Ref Range Status   Adenovirus NOT DETECTED NOT DETECTED Final   Coronavirus 229E NOT DETECTED NOT DETECTED Final    Comment: (NOTE) The Coronavirus on the Respiratory Panel, DOES NOT test for the novel  Coronavirus (2019 nCoV)    Coronavirus HKU1 NOT DETECTED NOT DETECTED Final   Coronavirus NL63 NOT DETECTED NOT DETECTED Final   Coronavirus OC43 NOT DETECTED NOT DETECTED Final   Metapneumovirus NOT DETECTED NOT DETECTED Final   Rhinovirus / Enterovirus NOT DETECTED NOT DETECTED Final   Influenza A NOT DETECTED NOT DETECTED Final   Influenza B NOT DETECTED NOT DETECTED Final   Parainfluenza Virus 1 NOT DETECTED NOT DETECTED Final   Parainfluenza Virus 2 NOT DETECTED NOT DETECTED Final   Parainfluenza Virus 3 NOT DETECTED NOT DETECTED Final   Parainfluenza Virus 4 NOT DETECTED NOT DETECTED  Final   Respiratory Syncytial Virus NOT DETECTED NOT DETECTED Final   Bordetella pertussis NOT DETECTED NOT DETECTED Final  Bordetella Parapertussis NOT DETECTED NOT DETECTED Final   Chlamydophila pneumoniae NOT DETECTED NOT DETECTED Final   Mycoplasma pneumoniae NOT DETECTED NOT DETECTED Final    Comment: Performed at [facility], [address] [place], [place] [address]  Aerobic Culture w Gram Stain (superficial specimen)     Status: None (Preliminary result)   Collection Time: 05/08/24  3:59 PM   Specimen: Wound  Result Value Ref Range Status   Specimen Description   Final    WOUND Performed at [facility], [address], [place], [place] [address]    Special Requests   Final    LEFT FOOT Performed at [facility], [address] [place], [place] [address]    Gram Stain NO WBC SEEN RARE GRAM POSITIVE COCCI   Final   Culture   Final    FEW STAPHYLOCOCCUS AUREUS SUSCEPTIBILITIES TO FOLLOW Performed at [facility], [address], [place], [place] [address]    Report Status PENDING  Incomplete     Radiology Studies: US  ARTERIAL ABI (SCREENING LOWER EXTREMITY) Result Date: 05/09/2024 CLINICAL DATA:  LEFT first toe amputation March 2024. Foot ulcer. Hypertension. LEFT claudication. EXAM: NONINVASIVE PHYSIOLOGIC VASCULAR STUDY OF BILATERAL LOWER EXTREMITIES TECHNIQUE: Evaluation of both lower extremities were performed at rest, including calculation of ankle-brachial indices with single level pressure measurements and doppler recording. COMPARISON:  None available. FINDINGS: Right ABI:  Noncompressible Left ABI:  Noncompressible Right Lower Extremity: Posterior tibial and dorsalis pedis artery waveforms are monophasic. Left Lower Extremity: Posterior tibial and dorsalis pedis artery waveforms are monophasic. > 1.4 Non diagnostic secondary to incompressible vessel calcifications (medial arterial sclerosis of [place]) IMPRESSION: Nondiagnostic  evaluation of the lower extremities due to incompressible vessels. Electronically Signed   By: Aliene Lloyd M.D.   On: 05/09/2024 13:05   MR FOOT LEFT WO CONTRAST Result Date: 05/09/2024 CLINICAL DATA:  Diabetic foot wound.  Concern for osteomyelitis. EXAM: MRI OF THE LEFT FOOT WITHOUT CONTRAST TECHNIQUE: Multiplanar, multisequence MR imaging of the left foot was performed. No intravenous contrast was administered. COMPARISON:  left foot radiographs dated 05/09/2024. FINDINGS: Bones/Joint/Cartilage Marrow edema with corresponding T1 hypointensity of the fifth proximal phalanx and the fifth metatarsal head and neck at the level of the fifth MTP joint is compatible with osteomyelitis/septic arthritis. Trace fifth MTP joint effusion. Overlying cutaneous wound. No convincing marrow signal abnormality identified elsewhere to suggest osteomyelitis. Prior amputation of the great toe proximal and distal phalanges. Flattening of the second metatarsal head with underlying serpiginous appearing curvilinear T1 and T2 hypo and hyperintense signal, suggestive of avascular necrosis. Mild degenerative arthropathy of the midfoot. Ligaments Lisfranc ligament is intact. Muscles and Tendons Diffuse atrophy of the intrinsic musculature of the foot likely reflects chronic denervation changes. No significant tenosynovitis. Soft tissue Cutaneous irregularity/wound at the plantar lateral forefoot, overlying the level of the fifth MTP joint. No abscess. Subcutaneous edema along the dorsal foot. IMPRESSION: 1. Marrow edema with corresponding T1 hypointensity of the fifth proximal phalanx and the fifth metatarsal head and neck at the level of the fifth MTP joint is compatible with osteomyelitis/septic arthritis. Trace fifth MTP joint effusion. Overlying cutaneous wound. No abscess. 2. Flattening of the second metatarsal head with findings suggestive of avascular necrosis. 3. Prior amputation of the great toe proximal and distal phalanges. 4.  Chronic denervation changes of the intrinsic foot musculature. Electronically Signed   By: Harrietta Sherry M.D.   On: 05/09/2024 11:14   DG Foot Complete Left  Result Date: 05/08/2024 EXAM: 3 or more VIEW(S) XRAY OF THE LEFT FOOT 05/08/2024 04:51:13 PM COMPARISON: 11/15/2023 CLINICAL HISTORY: Diabetic foot wound, eval for e/o osteomyelitis. FINDINGS: BONES AND JOINTS: Previous resection of the great toe. Chronic mixed lucency and sclerosis involving the second metatarsal head. No frank erosions or bone destruction. No fracture. SOFT TISSUES: Questionable wound involving the plantar aspect of the forefoot, seen only on the lateral view. Soft tissue edema of the forefoot. Advanced vascular calcifications. IMPRESSION: 1. No radiographic findings of osteomyelitis. 2. Chronic mixed lucency and sclerosis involving the second metatarsal head. 3. Questionable wound involving the plantar aspect of the forefoot, seen only on the lateral view. Soft tissue edema of the forefoot. Electronically signed by: Andrea Gasman MD 05/08/2024 05:16 PM EDT RP Workstation: [id number]   DG Chest Portable 1 View Result Date: 05/08/2024 CLINICAL DATA:  Cough. EXAM: PORTABLE CHEST 1 VIEW COMPARISON:  01/22/2021. FINDINGS: The heart size and mediastinal contours are within normal limits. 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 No focal consolidation, pleural effusion, or pneumothorax. Partially visualized cervical fusion hardware. No acute osseous abnormality. IMPRESSION: 1. No acute cardiopulmonary findings. 2. 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 Electronically Signed   By: Harrietta Sherry M.D.   On: 05/08/2024 15:08    Scheduled Meds:  aspirin  EC  81 mg Oral Daily   atorvastatin   80 mg Oral Daily   carvedilol   3.125 mg Oral Q12H   guaiFENesin   600 mg Oral BID   mycophenolate   500 mg Oral BID   pantoprazole    40 mg Oral Daily   predniSONE   5 mg Oral Daily   tacrolimus   1.5 mg Oral q morning   And   tacrolimus   1 mg Oral QHS   Continuous Infusions:  ceFEPime  (MAXIPIME ) IV 2 g (05/09/24 1445)   vancomycin  750 mg (05/09/24 1240)     LOS: 1 day  MDM: Patient is high risk for one or more organ failure.  They necessitate ongoing hospitalization for continued IV therapies and subsequent lab monitoring. Total time spent interpreting labs and vitals, reviewing the medical record, coordinating care amongst consultants and care team members, directly assessing and discussing care with the patient and/or family: 55 min  Laree Lock, MD [facility]  To contact the attending physician between 7A-7P please use Epic Chat. To contact the covering physician during after hours 7P-7A, please review Amion.  05/09/2024, 6:13 PM   *This document has been created with the assistance of dictation software. Please excuse typographical errors. *

## 2024-05-09 NOTE — Consult Note (Signed)
 PODIATRY CONSULTATION  NAME [name] MRN [id number] DOB 09-29-1971 DOA 05/08/2024   Reason for consult:  Left foot ulceration, possible osteomyelitis  Attending/Consulting physician: S. Ponnala MD  History of present illness: [name] is a 53 y.o. 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  Of note, patient has a prior left great toe amputation related to his diabetic neuropathy..  Separately, he also reports a cough for the past 2 months   Has been on different antibiotics for both foot wound and cough, most recently Levaquin  without improvement.  He has now developed fever, chills, fatigue and nausea . denies dysuria but has been urinating less than usual.  Denies change in bowel habits. In the ED he had a temp of 100.3 and heart rate up to 99, normal BP and O2 sats in the high 90s on room air.  Discussed with patient he is followed with Dr. Janit in the past for this wound.  He also sees wound care center for this.  He has a prior left great toe amputation.  Also end-stage renal disease status post kidney and pancreas transplant.  On immunosuppression.  He does report the area got much more red and swollen recently.  Was on outpatient oral antibiotics but there is recent concern for worsening of the wound and possible underlying infection.  He does have some sensation at the outside of the foot near the fifth toe joint but does not have pain at the wound itself.  Past Medical History:  Diagnosis Date   Cellulitis    Dialysis patient [facility])    M, W, F   DKA (diabetic ketoacidosis) (HCC)     Esophageal dysphagia    ESRD (end stage renal disease) (HCC)    Gastroparesis    Herniated cervical disc    HTN (hypertension)    Hypercholesteremia    Hypothyroidism    Morbid obesity (HCC)    OSA (obstructive sleep apnea)    Peritoneal dialysis status (HCC)    PONV (postoperative nausea and vomiting)    after peritoneal dialysis catheter was placed   Presence of insulin  pump    a.) parenteral insulin  admin with pen as of 09/20/2022; no using pump   S/P cardiac cath    a. 10-15 yrs ago at [facility] due to tachycardia, reportedly normal. b. Normal ETT 03/2012.   Sepsis (HCC)    Sinus tachycardia    a. 24-hr Holter 03/2012 - SR, occ PVCs, no VT, avg HR 96bpm.   TIA (transient ischemic attack)    Type 1 diabetes mellitus (HCC)        Latest Ref Rng & Units 05/09/2024    4:47 AM 05/08/2024    2:22 PM 09/24/2022    6:27 AM  CBC  WBC 4.0 - 10.5 K/uL 9.4  12.1    Hemoglobin 13.0 - 17.0 g/dL 86.9  85.6  86.6   Hematocrit 39.0 - 52.0 % 40.5  43.2  39.0   Platelets 150 - 400 K/uL 218  250         Latest Ref Rng & Units 05/09/2024    4:47 AM 05/08/2024    2:22 PM 09/24/2022  6:27 AM  BMP  Glucose 70 - 99 mg/dL 895  849  855   BUN 6 - 20 mg/dL 15  17  86   Creatinine 0.61 - 1.24 mg/dL 8.86  8.65  88.89   Sodium 135 - 145 mmol/L 134  130  128   Potassium 3.5 - 5.1 mmol/L 4.2  4.0  5.6   Chloride 98 - 111 mmol/L 105  98  96   CO2 22 - 32 mmol/L 24  24    Calcium  8.9 - 10.3 mg/dL 9.0  9.7        Physical Exam: Lower Extremity Exam Left foot DP pulse 2+ palpable, PT pulse 1+ palpable edema noted of the left foot.   Edema noted of the left foot to the ankle  Per patient he does bleed well upon debridement  Ulceration at the plantar aspect of the 4th and 5th metatarsal heads more centered over the fourth met head.  There is healthy wound bed with subcutaneous fat tissue present in the wound base.  No significant purulence or necrotic tissues at that area.  There is also a separate area of  small ulceration at the lateral aspect of the fifth metatarsal head with some pain on palpation and possible mild fluctuance.   Prior left great toe amputation at MPJ level  Sensation significantly diminished to light touch at the left forefoot though does have pain at the lateral aspect of the fifth metatarsal head    ASSESSMENT/PLAN OF CARE 53 y.o. 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   WBC 9.4 from 12.1 on admission ESR 47, CRP pending  Wound culture: Staph aureus susceptibility pending  MRI left foot without contrast: Marrow edema with corresponding T1 hypointensity concerning for osteomyelitis of the fifth proximal phalanx of fifth metatarsal head   as well as possible fifth MPJ septic arthritis.  -Had a long discussion with the patient regarding concern for osteomyelitis at the fifth metatarsal head and proximal phalanx.  Discussed surgical options including partial fifth ray amputation which she is at risk of needing.  He does not want to proceed with this at this time.  I also discussed the option of possible debridement of the ulceration with bone biopsy of the fifth metatarsal head to assess for osteomyelitis definitively as well as guide antibiotic therapy.  He is more amenable to this.  Will tentatively plan to do this on Friday.  N.p.o. midnight prior. -ABI completed concern for noncompressible vessels and dampened waveform.  Appreciate vascular consultation. - Continue IV abx broad spectrum pending further culture data - Anticoagulation: Hold pending OR - Wound care: Performed bedside debridement of the ulceration and took wound culture.  Applied  Xeroform 4 x 4 gauze Kerlix Ace roll dressing - WB status: Weightbearing as tolerated in postop shoe, ordered - Will continue to follow   Thank you for the consult.  Please contact me directly with any questions or concerns.           Marolyn JULIANNA Honour, DPM [facility]    [address]  [place], [place] [address]                Office [phone]  Fax [phone]

## 2024-05-09 NOTE — Plan of Care (Signed)
   Problem: Fluid Volume: Goal: Hemodynamic stability will improve Outcome: Progressing   Problem: Clinical Measurements: Goal: Diagnostic test results will improve Outcome: Progressing Goal: Signs and symptoms of infection will decrease Outcome: Progressing   Problem: Respiratory: Goal: Ability to maintain adequate ventilation will improve Outcome: Progressing

## 2024-05-09 NOTE — Progress Notes (Signed)
 PHARMACIST - PHYSICIAN COMMUNICATION  CONCERNING: IV to Oral Route Change Policy  RECOMMENDATION: This patient is receiving Pantoprazole  40 mg q24h by the intravenous route.  Based on criteria approved by the Pharmacy and Therapeutics Committee, the intravenous medication(s) is/are being converted to the equivalent oral dose form(s).  DESCRIPTION: These criteria include: The patient is eating (either orally or via tube) and/or has been taking other orally administered medications for a least 24 hours The patient has no evidence of active gastrointestinal bleeding or impaired GI absorption (gastrectomy, short bowel, patient on TNA or NPO).  If you have questions about this conversion, please contact the Pharmacy Department  []   [phone] )  [facility] [x]   [phone] )  [facility] []   [phone] )  [facility] []   [phone] )  [facility] []   [phone] )  [facility]    Thank you for involving pharmacy in this patient's care.     Ransom Blanch PGY-1 Pharmacy Resident  [facility]  05/09/2024 10:45 AM

## 2024-05-10 ENCOUNTER — Ambulatory Visit: Admitting: Physician Assistant

## 2024-05-10 DIAGNOSIS — L0390 Cellulitis, unspecified: Secondary | ICD-10-CM | POA: Diagnosis not present

## 2024-05-10 DIAGNOSIS — A419 Sepsis, unspecified organism: Secondary | ICD-10-CM | POA: Diagnosis not present

## 2024-05-10 LAB — AEROBIC CULTURE W GRAM STAIN (SUPERFICIAL SPECIMEN): Gram Stain: NONE SEEN

## 2024-05-10 LAB — CBC
HCT: 42.1 % (ref 39.0–52.0)
Hemoglobin: 13.4 g/dL (ref 13.0–17.0)
MCH: 28.5 pg (ref 26.0–34.0)
MCHC: 31.8 g/dL (ref 30.0–36.0)
MCV: 89.4 fL (ref 80.0–100.0)
Platelets: 265 K/uL (ref 150–400)
RBC: 4.71 MIL/uL (ref 4.22–5.81)
RDW: 13.4 % (ref 11.5–15.5)
WBC: 9.2 K/uL (ref 4.0–10.5)
nRBC: 0 % (ref 0.0–0.2)

## 2024-05-10 LAB — BASIC METABOLIC PANEL WITH GFR
Anion gap: 7 (ref 5–15)
BUN: 12 mg/dL (ref 6–20)
CO2: 22 mmol/L (ref 22–32)
Calcium: 9.3 mg/dL (ref 8.9–10.3)
Chloride: 105 mmol/L (ref 98–111)
Creatinine, Ser: 1.2 mg/dL (ref 0.61–1.24)
GFR, Estimated: >60 mL/min (ref >60)
Glucose, Bld: 102 mg/dL — ABNORMAL HIGH (ref 70–99)
Potassium: 4.5 mmol/L (ref 3.5–5.1)
Sodium: 134 mmol/L — ABNORMAL LOW (ref 135–145)

## 2024-05-10 NOTE — Plan of Care (Signed)

## 2024-05-10 NOTE — Progress Notes (Signed)
 PODIATRY PROGRESS NOTE Patient Name: [name]  DOB 1971-07-17 DOA 05/08/2024  Hospital Day: 3  Assessment:  54 y.o. 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    WBC 9.2 ESR 47, CRP  12.2   Wound culture:  MRSA   MRI left foot without contrast: Marrow edema with corresponding T1 hypointensity concerning for osteomyelitis of the fifth proximal phalanx of fifth metatarsal head   as well as possible fifth MPJ septic arthritis.  Plan:  - NPO p MN for OR tomorrow AM for left foot partial fifth ray amputation with graft application and wound vac application which he elected for after further discussion.  -ABI completed concern for noncompressible vessels and dampened waveform.  Appreciate vascular consultation. He wishes to defer angiogram at this time. - Continue IV abx broad spectrum pending further culture data - Anticoagulation: Hold pending OR - Wound care: Dry gauze dressing until OR - WB status: Weightbearing as tolerated in postop shoe, ordered - Will continue to follow         Marolyn JULIANNA Honour, DPM [facility]    Subjective:  Discussed with patient and he informed me he would rather go ahead with partial fifth ray amputation. Discussed risks benefits and alternatives including biopsy he wishes to proceed. Has discussed his care with other doctors who agree he should proceed.   Objective:   Vitals:   05/10/24 0825 05/10/24 1528  BP: 131/76 (!) 133/53  Pulse: 71 69  Resp: 18 17  Temp: 98.7 F (37.1 C) 98.1 F (36.7 C)  SpO2: 92% 95%       Latest Ref Rng & Units 05/10/2024    5:06 AM 05/09/2024    4:47 AM 05/08/2024     2:22 PM  CBC  WBC 4.0 - 10.5 K/uL 9.2  9.4  12.1   Hemoglobin 13.0 - 17.0 g/dL 86.5  86.9  85.6   Hematocrit 39.0 - 52.0 % 42.1  40.5  43.2   Platelets 150 - 400 K/uL 265  218  250        Latest Ref Rng & Units 05/10/2024    5:06 AM 05/09/2024    4:47 AM 05/08/2024    2:22 PM  BMP  Glucose 70 - 99 mg/dL 897  895  849   BUN 6 - 20 mg/dL 12  15  17    Creatinine 0.61 - 1.24 mg/dL 8.79  8.86  8.65   Sodium 135 - 145 mmol/L 134  134  130   Potassium 3.5 - 5.1 mmol/L 4.5  4.2  4.0   Chloride 98 - 111 mmol/L 105  105  98   CO2 22 - 32 mmol/L 22  24  24    Calcium  8.9 - 10.3 mg/dL 9.3  9.0  9.7     General: AAOx3, NAD  Lower Extremity Exam Left foot DP pulse 2+ palpable, PT pulse 1+ palpable edema noted of the left foot.    Edema noted of the left foot to the ankle   Per patient he does bleed well upon debridement   Ulceration at the plantar aspect of the 4th and 5th metatarsal heads more centered over  the fourth met head.  There is healthy wound bed with subcutaneous fat tissue present in the wound base.  No significant purulence or necrotic tissues at that area.  There is also a separate area of small ulceration at the lateral aspect of the fifth metatarsal head with some pain on palpation and possible mild fluctuance.     Prior left great toe amputation at MPJ level   Sensation significantly diminished to light touch at the left forefoot though does have pain at the lateral aspect of the fifth metatarsal head   Radiology:  Results reviewed. See assessment for pertinent imaging results

## 2024-05-10 NOTE — Progress Notes (Signed)
 PROGRESS NOTE    [name]  FMW:[id number] DOB: October 22, 1970 DOA: 05/08/2024 PCP: Rudolpho Norleen JONETTA, MD  Chief Complaint  Patient presents with   Pneumonia    Hospital Course:  [name] is a 53 y.o. 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   Patient admitted due to sepsis due to left foot cellulitis, lower extremity infection metatarsal head.  Plan for I&D by podiatry tomorrow  Subjective: Patient was examined at the bedside, states he feels much better today.  Cough is also improving Plan for I&D by podiatry tomorrow   Objective: Vitals:   05/09/24 1436 05/09/24 1930 05/10/24 0825 05/10/24 1528  BP: 134/63 (!) 118/58 131/76 (!) 133/53  Pulse: 75 76 71 69  Resp: 16 18 18 17   Temp: 98.1 F (36.7 C) 98.3 F (36.8 C) 98.7 F (37.1 C) 98.1 F (36.7 C)  TempSrc: Oral Oral Oral Oral  SpO2: 96% 94% 92% 95%  Weight:      Height:        Intake/Output Summary (Last 24 hours) at 05/10/2024 1546 Last data filed at 05/10/2024 1500 Gross per 24 hour  Intake 1580.1 ml  Output 200 ml  Net 1380.1 ml   Filed Weights   05/08/24 1419  Weight: 114.8 kg    Examination: Constitutional:      General: He is not in acute distress. HENT:     Head: Normocephalic and atraumatic.  Cardiovascular:     Rate and Rhythm: Normal rate and regular rhythm.     Heart sounds: Normal heart sounds.  Pulmonary:     Effort: Pulmonary effort is normal.     Breath sounds: Normal breath sounds.  Abdominal:     Palpations: Abdomen is soft.     Tenderness: There is no abdominal tenderness.  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)   Type 1 diabetes mellitus with diabetic foot infection (HCC)   Nausea   Chronic cough   Status post simultaneous kidney and pancreas transplant (HCC)   Type 1 diabetes s/p pancreas transplant 10/2022 (HCC)   Benign essential hypertension   Hypothyroidism   Presence of insulin  pump   Immunosuppression (HCC)   OSA (obstructive sleep apnea)   Ulcer of left foot with fat layer exposed (HCC)   Pyogenic inflammation of bone (HCC)   Sepsis due to left foot cellulitis 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  No abscess.  Flattening of the second metatarsal wound findings suggestive of avascular necrosis -US  Arterial ABI nondiagnostic due to incompressible vessels -Wcx 08/05 Rare GPC, Few MRSA -Continue IV cefepime  and vancomycin  -Seen by Podiatry, appreciate recs.  Discussed options including partial fifth  ray amputation, does not want to proceed.  Also discussed possible debridement of the ulceration with bone biopsy of fifth metatarsal head to assess for osteomyelitis definitively and to guide antibiotic therapy.  Planned for Friday 08/08 - NPO PM - Refused angiogram, Vascular surgery consult pending -Wound care at bedside, weightbearing as tolerated - Pain control - Follow wound culture, blood culture   Chronic cough Nausea with dry heaving Patient with 3 months of unresolved cough, treated with antibiotics No evidence  of pneumonia or bronchitis on chest x-ray Antitussives as needed.  Albuterol  if wheezing  Empiric PPI for possible GERD Outpatient follow-up-can consider outpatient PFTs/referral to pulmonologist  Status post simultaneous kidney and pancreas transplant [facility]) Chronic immunosuppression Transplant MD, Dr[facility] Adina Lever at [facility] was contacted and said okay to continue all immunosuppressants Continue tacrolimus , prednisone  and CellCept   Type 1 diabetes s/p pancreas transplant 10/2022 [facility]) No longer needs insulin  A1c 6.0 on 04/03/24   OSA (obstructive sleep apnea) CPAP at bedtime if desired   Benign essential hypertension Continue Coreg   DVT prophylaxis:  SCD's   Code Status: Full Code Disposition:  TBD  Consultants:  Treatment Team:  Consulting Physician: [name], DPM  Procedures:  TBD  Antimicrobials:  Anti-infectives (From admission, onward)    Start     Dose/Rate Route Frequency Ordered Stop   05/09/24 1200  vancomycin  (VANCOREADY) IVPB 750 mg/150 mL        750 mg 150 mL/hr over 60 Minutes Intravenous Every 8 hours 05/09/24 1107     05/09/24 0000  vancomycin  (VANCOREADY) IVPB 1250 mg/250 mL  Status:  Discontinued        1,250 mg 166.7 mL/hr over 90 Minutes Intravenous Every 12 hours 05/08/24 2142 05/09/24 1107   05/08/24 2200  ceFEPIme  (MAXIPIME ) 2 g in sodium chloride  0.9 % 100 mL IVPB        2 g 200 mL/hr over 30 Minutes Intravenous Every 8 hours 05/08/24 2142     05/08/24 1630  ceFEPIme  (MAXIPIME ) 2 g in sodium chloride  0.9 % 100 mL IVPB        2 g 200 mL/hr over 30 Minutes Intravenous  Once 05/08/24 1619 05/08/24 1700   05/08/24 1630  vancomycin  (VANCOREADY) IVPB 2000 mg/400 mL        2,000 mg 200 mL/hr over 120 Minutes Intravenous  Once 05/08/24 1619 05/08/24 1947       Data Reviewed: I have personally reviewed following labs and imaging studies CBC: Recent Labs  Lab 05/08/24 1422 05/09/24 0447 05/10/24 0506  WBC 12.1* 9.4 9.2  NEUTROABS 7.9*   --   --   HGB 14.3 13.0 13.4  HCT 43.2 40.5 42.1  MCV 87.4 88.8 89.4  PLT 250 218 265   Basic Metabolic Panel: Recent Labs  Lab 05/08/24 1422 05/09/24 0447 05/10/24 0506  NA 130* 134* 134*  K 4.0 4.2 4.5  CL 98 105 105  CO2 24 24 22   GLUCOSE 150* 104* 102*  BUN 17 15 12   CREATININE 1.34* 1.13 1.20  CALCIUM  9.7 9.0 9.3   GFR: Estimated Creatinine Clearance: 95.3 mL/min (by C-G formula based on SCr of 1.2 mg/dL). Liver Function Tests: Recent Labs  Lab 05/08/24 1559  AST 23  ALT 15  ALKPHOS 67  BILITOT 1.3*  PROT 7.5  ALBUMIN 3.1*   CBG: Recent Labs  Lab 05/09/24 0807  GLUCAP 98    Recent Results (from the past 240 hours)  Blood Culture (routine x 2)  Status: None (Preliminary result)   Collection Time: 05/08/24  3:55 PM   Specimen: BLOOD  Result Value Ref Range Status   Specimen Description BLOOD RIGHT ANTECUBITAL  Final   Special Requests   Final    BOTTLES DRAWN AEROBIC AND ANAEROBIC Blood Culture results may not be optimal due to an inadequate volume of blood received in culture bottles   Culture   Final    NO GROWTH 2 DAYS Performed at [facility], [address], [place], [place] [address]    Report Status PENDING  Incomplete  Blood Culture (routine x 2)     Status: None (Preliminary result)   Collection Time: 05/08/24  3:55 PM   Specimen: BLOOD  Result Value Ref Range Status   Specimen Description BLOOD BLOOD RIGHT FOREARM  Final   Special Requests   Final    BOTTLES DRAWN AEROBIC ONLY Blood Culture results may not be optimal due to an inadequate volume of blood received in culture bottles   Culture   Final    NO GROWTH 2 DAYS Performed at [facility], [address] [place], [place] [address]    Report Status PENDING  Incomplete  Respiratory (~20 pathogens) panel by PCR     Status: None   Collection Time: 05/08/24  3:55 PM   Specimen: Nasopharyngeal Swab; Respiratory  Result Value Ref Range Status   Adenovirus NOT  DETECTED NOT DETECTED Final   Coronavirus 229E NOT DETECTED NOT DETECTED Final    Comment: (NOTE) The Coronavirus on the Respiratory Panel, DOES NOT test for the novel  Coronavirus (2019 nCoV)    Coronavirus HKU1 NOT DETECTED NOT DETECTED Final   Coronavirus NL63 NOT DETECTED NOT DETECTED Final   Coronavirus OC43 NOT DETECTED NOT DETECTED Final   Metapneumovirus NOT DETECTED NOT DETECTED Final   Rhinovirus / Enterovirus NOT DETECTED NOT DETECTED Final   Influenza A NOT DETECTED NOT DETECTED Final   Influenza B NOT DETECTED NOT DETECTED Final   Parainfluenza Virus 1 NOT DETECTED NOT DETECTED Final   Parainfluenza Virus 2 NOT DETECTED NOT DETECTED Final   Parainfluenza Virus 3 NOT DETECTED NOT DETECTED Final   Parainfluenza Virus 4 NOT DETECTED NOT DETECTED Final   Respiratory Syncytial Virus NOT DETECTED NOT DETECTED Final   Bordetella pertussis NOT DETECTED NOT DETECTED Final   Bordetella Parapertussis NOT DETECTED NOT DETECTED Final   Chlamydophila pneumoniae NOT DETECTED NOT DETECTED Final   Mycoplasma pneumoniae NOT DETECTED NOT DETECTED Final    Comment: Performed at [facility], [address], [place], [place] [address]  Aerobic Culture w Gram Stain (superficial specimen)     Status: None   Collection Time: 05/08/24  3:59 PM   Specimen: Wound  Result Value Ref Range Status   Specimen Description   Final    WOUND Performed at [facility], [address], [place], [place] [address]    Special Requests   Final    LEFT FOOT Performed at [facility], [address] [place], [place] [address]    Gram Stain   Final    NO WBC SEEN RARE GRAM POSITIVE COCCI Performed at [facility], [address], [place], [place] [address]    Culture FEW METHICILLIN RESISTANT STAPHYLOCOCCUS AUREUS  Final   Report Status 05/10/2024 FINAL  Final   Organism ID, Bacteria METHICILLIN RESISTANT STAPHYLOCOCCUS AUREUS  Final      Susceptibility   Methicillin  resistant staphylococcus aureus - MIC*  CIPROFLOXACIN  >=8 RESISTANT Resistant     ERYTHROMYCIN  >=8 RESISTANT Resistant     GENTAMICIN  <=0.5 SENSITIVE Sensitive     OXACILLIN >=4 RESISTANT Resistant     TETRACYCLINE <=1 SENSITIVE Sensitive     VANCOMYCIN  1 SENSITIVE Sensitive     TRIMETH /SULFA  >=320 RESISTANT Resistant     CLINDAMYCIN <=0.25 SENSITIVE Sensitive     RIFAMPIN <=0.5 SENSITIVE Sensitive     Inducible Clindamycin NEGATIVE Sensitive     LINEZOLID 2 SENSITIVE Sensitive     * FEW METHICILLIN RESISTANT STAPHYLOCOCCUS AUREUS     Radiology Studies: US  ARTERIAL ABI (SCREENING LOWER EXTREMITY) Result Date: 05/09/2024 CLINICAL DATA:  LEFT first toe amputation March 2024. Foot ulcer. Hypertension. LEFT claudication. EXAM: NONINVASIVE PHYSIOLOGIC VASCULAR STUDY OF BILATERAL LOWER EXTREMITIES TECHNIQUE: Evaluation of both lower extremities were performed at rest, including calculation of ankle-brachial indices with single level pressure measurements and doppler recording. COMPARISON:  None available. FINDINGS: Right ABI:  Noncompressible Left ABI:  Noncompressible Right Lower Extremity: Posterior tibial and dorsalis pedis artery waveforms are monophasic. Left Lower Extremity: Posterior tibial and dorsalis pedis artery waveforms are monophasic. > 1.4 Non diagnostic secondary to incompressible vessel calcifications (medial arterial sclerosis of [place]) IMPRESSION: Nondiagnostic evaluation of the lower extremities due to incompressible vessels. Electronically Signed   By: Aliene Lloyd M.D.   On: 05/09/2024 13:05   MR FOOT LEFT WO CONTRAST Result Date: 05/09/2024 CLINICAL DATA:  Diabetic foot wound.  Concern for osteomyelitis. EXAM: MRI OF THE LEFT FOOT WITHOUT CONTRAST TECHNIQUE: Multiplanar, multisequence MR imaging of the left foot was performed. No intravenous contrast was administered. COMPARISON:  left foot radiographs dated 05/09/2024. FINDINGS: Bones/Joint/Cartilage Marrow edema with  corresponding T1 hypointensity of the fifth proximal phalanx and the fifth metatarsal head and neck at the level of the fifth MTP joint is compatible with osteomyelitis/septic arthritis. Trace fifth MTP joint effusion. Overlying cutaneous wound. No convincing marrow signal abnormality identified elsewhere to suggest osteomyelitis. Prior amputation of the great toe proximal and distal phalanges. Flattening of the second metatarsal head with underlying serpiginous appearing curvilinear T1 and T2 hypo and hyperintense signal, suggestive of avascular necrosis. Mild degenerative arthropathy of the midfoot. Ligaments Lisfranc ligament is intact. Muscles and Tendons Diffuse atrophy of the intrinsic musculature of the foot likely reflects chronic denervation changes. No significant tenosynovitis. Soft tissue Cutaneous irregularity/wound at the plantar lateral forefoot, overlying the level of the fifth MTP joint. No abscess. Subcutaneous edema along the dorsal foot. IMPRESSION: 1. Marrow edema with corresponding T1 hypointensity of the fifth proximal phalanx and the fifth metatarsal head and neck at the level of the fifth MTP joint is compatible with osteomyelitis/septic arthritis. Trace fifth MTP joint effusion. Overlying cutaneous wound. No abscess. 2. Flattening of the second metatarsal head with findings suggestive of avascular necrosis. 3. Prior amputation of the great toe proximal and distal phalanges. 4. Chronic denervation changes of the intrinsic foot musculature. Electronically Signed   By: Harrietta Sherry M.D.   On: 05/09/2024 11:14   DG Foot Complete Left Result Date: 05/08/2024 EXAM: 3 or more VIEW(S) XRAY OF THE LEFT FOOT 05/08/2024 04:51:13 PM COMPARISON: 11/15/2023 CLINICAL HISTORY: Diabetic foot wound, eval for e/o osteomyelitis. FINDINGS: BONES AND JOINTS: Previous resection of the great toe. Chronic mixed lucency and sclerosis involving the second metatarsal head. No frank erosions or bone destruction.  No fracture. SOFT TISSUES: Questionable wound involving the plantar aspect of the forefoot, seen only on the lateral view. Soft tissue edema of the forefoot. Advanced vascular calcifications.  IMPRESSION: 1. No radiographic findings of osteomyelitis. 2. Chronic mixed lucency and sclerosis involving the second metatarsal head. 3. Questionable wound involving the plantar aspect of the forefoot, seen only on the lateral view. Soft tissue edema of the forefoot. Electronically signed by: Andrea Gasman MD 05/08/2024 05:16 PM EDT RP Workstation: [id number]    Scheduled Meds:  aspirin  EC  81 mg Oral Daily   atorvastatin   80 mg Oral Daily   carvedilol   3.125 mg Oral Q12H   guaiFENesin   600 mg Oral BID   mycophenolate   500 mg Oral BID   pantoprazole   40 mg Oral Daily   predniSONE   5 mg Oral Daily   tacrolimus   1.5 mg Oral q morning   And   tacrolimus   1 mg Oral QHS   Continuous Infusions:  ceFEPime  (MAXIPIME ) IV 2 g (05/10/24 1308)   vancomycin  750 mg (05/10/24 1202)     LOS: 2 days  MDM: Patient is high risk for one or more organ failure.  They necessitate ongoing hospitalization for continued IV therapies and subsequent lab monitoring. Total time spent interpreting labs and vitals, reviewing the medical record, coordinating care amongst consultants and care team members, directly assessing and discussing care with the patient and/or family: 55 min  Laree Lock, MD [facility]  To contact the attending physician between 7A-7P please use Epic Chat. To contact the covering physician during after hours 7P-7A, please review Amion.  05/10/2024, 3:46 PM   *This document has been created with the assistance of dictation software. Please excuse typographical errors. *

## 2024-05-10 NOTE — Plan of Care (Signed)
?  Problem: Clinical Measurements: ?Goal: Will remain free from infection ?Outcome: Progressing ?Goal: Diagnostic test results will improve ?Outcome: Progressing ?Goal: Respiratory complications will improve ?Outcome: Progressing ?  ?

## 2024-05-11 ENCOUNTER — Inpatient Hospital Stay: Admitting: Certified Registered"

## 2024-05-11 ENCOUNTER — Other Ambulatory Visit: Payer: Self-pay

## 2024-05-11 ENCOUNTER — Encounter: Admission: EM | Disposition: A | Discharge status: Home Health | Payer: Self-pay | Source: Home / Self Care

## 2024-05-11 ENCOUNTER — Encounter: Payer: Self-pay | Admitting: Internal Medicine

## 2024-05-11 ENCOUNTER — Inpatient Hospital Stay

## 2024-05-11 DIAGNOSIS — L97522 Non-pressure chronic ulcer of other part of left foot with fat layer exposed: Secondary | ICD-10-CM | POA: Diagnosis not present

## 2024-05-11 DIAGNOSIS — L0390 Cellulitis, unspecified: Secondary | ICD-10-CM | POA: Diagnosis not present

## 2024-05-11 DIAGNOSIS — M24572 Contracture, left ankle: Secondary | ICD-10-CM

## 2024-05-11 DIAGNOSIS — E10649 Type 1 diabetes mellitus with hypoglycemia without coma: Secondary | ICD-10-CM

## 2024-05-11 DIAGNOSIS — M86172 Other acute osteomyelitis, left ankle and foot: Secondary | ICD-10-CM | POA: Diagnosis not present

## 2024-05-11 DIAGNOSIS — A419 Sepsis, unspecified organism: Secondary | ICD-10-CM | POA: Diagnosis not present

## 2024-05-11 HISTORY — PX: ACHILLES TENDON LENGTHENING: SHX6455

## 2024-05-11 HISTORY — PX: TRANSMETATARSAL AMPUTATION: SHX6197

## 2024-05-11 LAB — BASIC METABOLIC PANEL WITH GFR
Anion gap: 6 (ref 5–15)
BUN: 14 mg/dL (ref 6–20)
CO2: 25 mmol/L (ref 22–32)
Calcium: 9.2 mg/dL (ref 8.9–10.3)
Chloride: 105 mmol/L (ref 98–111)
Creatinine, Ser: 1.07 mg/dL (ref 0.61–1.24)
GFR, Estimated: >60 mL/min (ref >60)
Glucose, Bld: 128 mg/dL — ABNORMAL HIGH (ref 70–99)
Potassium: 4.3 mmol/L (ref 3.5–5.1)
Sodium: 136 mmol/L (ref 135–145)

## 2024-05-11 SURGERY — AMPUTATION, FOOT, TRANSMETATARSAL
Anesthesia: General | Site: Foot | Laterality: Left

## 2024-05-11 MED ORDER — LIDOCAINE HCL (CARDIAC) PF 100 MG/5ML IV SOSY
PREFILLED_SYRINGE | INTRAVENOUS | Status: DC | PRN
  Administered 2024-05-11: 50 mg via INTRAVENOUS

## 2024-05-11 MED ORDER — 0.9 % SODIUM CHLORIDE (POUR BTL) OPTIME
TOPICAL | Status: DC | PRN
  Administered 2024-05-11: 200 mL

## 2024-05-11 MED ORDER — FENTANYL CITRATE (PF) 100 MCG/2ML IJ SOLN
INTRAMUSCULAR | Status: DC | PRN
  Administered 2024-05-11 (×2): 50 ug via INTRAVENOUS

## 2024-05-11 MED ORDER — DEXAMETHASONE SODIUM PHOSPHATE 10 MG/ML IJ SOLN
INTRAMUSCULAR | Status: DC | PRN
Start: 2024-05-11 — End: 2024-05-11
  Administered 2024-05-11: 10 mg via INTRAVENOUS

## 2024-05-11 MED ORDER — MIDAZOLAM HCL 2 MG/2ML IJ SOLN
INTRAMUSCULAR | Status: DC | PRN
  Administered 2024-05-11: 2 mg via INTRAVENOUS

## 2024-05-11 MED ORDER — DOXYCYCLINE HYCLATE 100 MG PO TABS
100.0000 mg | ORAL_TABLET | Freq: Two times a day (BID) | ORAL | Status: DC
  Administered 2024-05-11 – 2024-05-13 (×5): 100 mg via ORAL
  Filled 2024-05-11 (×5): qty 1

## 2024-05-11 MED ORDER — MIDAZOLAM HCL 2 MG/2ML IJ SOLN
INTRAMUSCULAR | Status: AC
  Filled 2024-05-11: qty 2

## 2024-05-11 MED ORDER — PROPOFOL 10 MG/ML IV BOLUS
INTRAVENOUS | Status: DC | PRN
  Administered 2024-05-11: 200 mg via INTRAVENOUS

## 2024-05-11 MED ORDER — PHENYLEPHRINE 80 MCG/ML (10ML) SYRINGE FOR IV PUSH (FOR BLOOD PRESSURE SUPPORT)
PREFILLED_SYRINGE | INTRAVENOUS | Status: DC | PRN
  Administered 2024-05-11 (×4): 80 ug via INTRAVENOUS

## 2024-05-11 MED ORDER — FENTANYL CITRATE (PF) 100 MCG/2ML IJ SOLN: 25.0000 ug | INTRAMUSCULAR | Status: DC | PRN

## 2024-05-11 MED ORDER — DOXYCYCLINE HYCLATE 100 MG PO TABS: 100.0000 mg | ORAL_TABLET | Freq: Two times a day (BID) | ORAL | Status: DC

## 2024-05-11 MED ORDER — HYDROCORTISONE SOD SUC (PF) 100 MG IJ SOLR
INTRAMUSCULAR | Status: DC | PRN
  Administered 2024-05-11: 100 mg via INTRAVENOUS

## 2024-05-11 MED ORDER — PROPOFOL 1000 MG/100ML IV EMUL
INTRAVENOUS | Status: AC
Start: 2024-05-11 — End: 2024-05-11
  Filled 2024-05-11: qty 100

## 2024-05-11 MED ORDER — FENTANYL CITRATE (PF) 100 MCG/2ML IJ SOLN
INTRAMUSCULAR | Status: AC
  Filled 2024-05-11: qty 2

## 2024-05-11 MED ORDER — LIDOCAINE HCL (PF) 1 % IJ SOLN
INTRAMUSCULAR | Status: DC | PRN
  Administered 2024-05-11: 20 mL

## 2024-05-11 MED ORDER — DROPERIDOL 2.5 MG/ML IJ SOLN
0.6250 mg | Freq: Once | INTRAMUSCULAR | Status: AC
  Administered 2024-05-11: 0.625 mg via INTRAVENOUS

## 2024-05-11 MED ORDER — HYDROMORPHONE HCL 1 MG/ML IJ SOLN
0.5000 mg | INTRAMUSCULAR | Status: DC | PRN
  Administered 2024-05-11 – 2024-05-12 (×4): 0.5 mg via INTRAVENOUS
  Filled 2024-05-11 (×4): qty 0.5

## 2024-05-11 MED ORDER — LACTATED RINGERS IV SOLN: INTRAVENOUS | Status: DC | PRN

## 2024-05-11 MED ORDER — SODIUM CHLORIDE 0.9 % IR SOLN
Status: DC | PRN
  Administered 2024-05-11: 1000 mL

## 2024-05-11 MED ORDER — PHENYLEPHRINE HCL-NACL 20-0.9 MG/250ML-% IV SOLN
INTRAVENOUS | Status: DC | PRN
Start: 2024-05-11 — End: 2024-05-11
  Administered 2024-05-11: 20 ug/min via INTRAVENOUS

## 2024-05-11 MED ORDER — ONDANSETRON HCL 4 MG/2ML IJ SOLN
INTRAMUSCULAR | Status: DC | PRN
  Administered 2024-05-11: 4 mg via INTRAVENOUS

## 2024-05-11 MED ORDER — DROPERIDOL 2.5 MG/ML IJ SOLN
INTRAMUSCULAR | Status: AC
  Filled 2024-05-11: qty 2

## 2024-05-11 MED ORDER — BUPIVACAINE HCL (PF) 0.5 % IJ SOLN
INTRAMUSCULAR | Status: AC
  Filled 2024-05-11: qty 30

## 2024-05-11 MED ORDER — LIDOCAINE HCL (PF) 1 % IJ SOLN
INTRAMUSCULAR | Status: AC
  Filled 2024-05-11: qty 30

## 2024-05-11 SURGICAL SUPPLY — 37 items
BLADE MED AGGRESSIVE (BLADE) IMPLANT
BLADE SURG 15 STRL LF DISP TIS (BLADE) ×4 IMPLANT
BNDG ELASTIC 4INX 5YD STR LF (GAUZE/BANDAGES/DRESSINGS) ×2 IMPLANT
BNDG ESMARCH 4X12 STRL LF (GAUZE/BANDAGES/DRESSINGS) IMPLANT
BNDG GAUZE DERMACEA FLUFF 4 (GAUZE/BANDAGES/DRESSINGS) ×2 IMPLANT
CNTNR URN SCR LID CUP LEK RST (MISCELLANEOUS) IMPLANT
CUFF TOURN SGL QUICK 34 NS (TOURNIQUET CUFF) IMPLANT
ELECTRODE REM PT RTRN 9FT ADLT (ELECTROSURGICAL) ×2 IMPLANT
GAUZE PAD ABD 8X10 STRL (GAUZE/BANDAGES/DRESSINGS) IMPLANT
GAUZE SPONGE 4X4 12PLY STRL (GAUZE/BANDAGES/DRESSINGS) ×2 IMPLANT
GAUZE STRETCH 2X75IN STRL (MISCELLANEOUS) IMPLANT
GAUZE XEROFORM 1X8 LF (GAUZE/BANDAGES/DRESSINGS) ×2 IMPLANT
GLOVE BIOGEL PI IND STRL 7.5 (GLOVE) ×2 IMPLANT
GLOVE SURG SYN 7.5 PF PI (GLOVE) ×2 IMPLANT
GOWN STRL REUS W/ TWL LRG LVL3 (GOWN DISPOSABLE) IMPLANT
GOWN STRL REUS W/ TWL XL LVL3 (GOWN DISPOSABLE) ×2 IMPLANT
IRRIGATION STRYKERFLOW (MISCELLANEOUS) IMPLANT
KIT TURNOVER KIT A (KITS) ×2 IMPLANT
MANIFOLD NEPTUNE II (INSTRUMENTS) ×2 IMPLANT
NDL FILTER BLUNT 18X1 1/2 (NEEDLE) IMPLANT
NDL HYPO 25X1 1.5 SAFETY (NEEDLE) ×2 IMPLANT
NEEDLE FILTER BLUNT 18X1 1/2 (NEEDLE) IMPLANT
NEEDLE HYPO 25X1 1.5 SAFETY (NEEDLE) ×2 IMPLANT
NS IRRIG 500ML POUR BTL (IV SOLUTION) ×2 IMPLANT
PACK EXTREMITY ARMC (MISCELLANEOUS) ×2 IMPLANT
PAD ABD DERMACEA PRESS 5X9 (GAUZE/BANDAGES/DRESSINGS) IMPLANT
PAD PREP OB/GYN DISP 24X41 (PERSONAL CARE ITEMS) ×2 IMPLANT
PENCIL SMOKE EVACUATOR (MISCELLANEOUS) ×2 IMPLANT
SOLUTION PREP PVP 2OZ (MISCELLANEOUS) IMPLANT
SPONGE T-LAP 18X18 ~~LOC~~+RFID (SPONGE) IMPLANT
STAPLER SKIN PROX 35W (STAPLE) ×2 IMPLANT
STOCKINETTE 48X4 2 PLY STRL (GAUZE/BANDAGES/DRESSINGS) ×2 IMPLANT
STOCKINETTE STRL 4IN 9604848 (GAUZE/BANDAGES/DRESSINGS) ×2 IMPLANT
SUT MNCRL AB 3-0 PS2 27 (SUTURE) IMPLANT
SUT PROLENE 3 0 PS 2 (SUTURE) ×2 IMPLANT
SYR 10ML LL (SYRINGE) ×2 IMPLANT
TRAP FLUID SMOKE EVACUATOR (MISCELLANEOUS) ×2 IMPLANT

## 2024-05-11 NOTE — Plan of Care (Signed)

## 2024-05-11 NOTE — Progress Notes (Signed)
 PROGRESS NOTE    [name]  FMW:[id number] DOB: 1970-12-04 DOA: 05/08/2024 PCP: Rudolpho Norleen JONETTA, MD  Chief Complaint  Patient presents with   Pneumonia    Hospital Course:  [name] is a 53 y.o. 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   Patient admitted due to sepsis due to left foot cellulitis, lower extremity infection metatarsal head.  S/p TMA left foot and tendo achilles lengthening left ankle  Subjective: Patient was examined at the bedside, states he feels much better today.  Cough is also improving S/p TMA left foot and tendo achilles lengthening left ankle Anticipate discharge tomorrow on po abx   Objective: Vitals:   05/11/24 1130 05/11/24 1145 05/11/24 1155 05/11/24 1218  BP: (!) 105/46 (!) 101/45 (!) 113/58 (!) 156/74  Pulse: 70 67 69 72  Resp: 15 13 17 18   Temp:  (!) 97 F (36.1 C)  98 F (36.7 C)  TempSrc:      SpO2: 92% 93% (!) 80% 92%  Weight:      Height:        Intake/Output Summary (Last 24 hours) at 05/11/2024 1556 Last data filed at 05/11/2024 1300 Gross per 24 hour  Intake 1000 ml  Output 10 ml  Net 990 ml   Filed Weights   05/08/24 1419 05/11/24 0611  Weight: 114.8 kg 121.6 kg    Examination: Constitutional:      General: He is not in acute distress. HENT:     Head: Normocephalic and atraumatic.  Cardiovascular:     Rate and Rhythm: Normal rate and regular rhythm.     Heart sounds: Normal heart sounds.  Pulmonary:     Effort: Pulmonary effort is normal.     Breath sounds: Normal breath sounds.  Abdominal:     Palpations: Abdomen is soft.      Tenderness: There is no abdominal tenderness.  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)   Type 1 diabetes mellitus with diabetic foot infection (HCC)   Nausea   Chronic cough   Status post simultaneous kidney and pancreas transplant (HCC)   Type 1 diabetes s/p pancreas transplant 10/2022 (HCC)   Benign essential hypertension   Hypothyroidism   Presence of insulin  pump   Immunosuppression (HCC)   OSA (obstructive sleep apnea)   Ulcer of left foot with fat layer exposed (HCC)   Pyogenic inflammation of bone (HCC)   Equinus contracture of left ankle   Sepsis due to left foot cellulitis Osteomyelitis and abscess of 5th ray left foot with plantar foot ulceration  S/p TMA left foot and Tendo achilles lengthening left ankle -Sepsis criteria include fever, tachycardia with source of infection -Patient failed outpatient management with Levaquin  -MRI Left foot fifth MTP compatible with osteomyelitis/septic arthritis.  No abscess.  Flattening of the second metatarsal wound findings suggestive of avascular necrosis -US  Arterial ABI nondiagnostic due to incompressible vessels -Wcx 08/05  Rare GPC, Few MRSA -was on IV cefepime  and vancomycin , changed to po doxycycline  to complete another 7 days -Seen by Podiatry, appreciate recs. S/p TMA left foot and Tendo achilles lengthening left ankle - Refused angiogram, Vascular surgery consult pending -Wound care at bedside, weightbearing as tolerated - Pain control - Follow wound culture, blood culture, Intra-op pathology pending   Chronic cough Nausea with dry heaving Patient with 3 months of unresolved cough, treated with antibiotics No evidence of pneumonia or bronchitis on chest x-ray Antitussives as needed.  Albuterol  if  wheezing  Empiric PPI for possible GERD Outpatient follow-up-can consider outpatient PFTs/referral to pulmonologist  Status post simultaneous kidney and pancreas transplant [facility]) Chronic immunosuppression Transplant MD, Dr. Adina Lever at [facility] was contacted and said okay to continue all immunosuppressants Continue tacrolimus , prednisone  and CellCept   Type 1 diabetes s/p pancreas transplant 10/2022 [facility]) No longer needs insulin  A1c 6.0 on 04/03/24   OSA (obstructive sleep apnea) CPAP at bedtime if desired   Benign essential hypertension Continue Coreg   DVT prophylaxis:  SCD's   Code Status: Full Code Disposition:  TBD  Consultants:  Treatment Team:  Consulting Physician: [name], DPM  Procedures:  S/p TMA left foot and Tendo achilles lengthening left ankle  Antimicrobials:  Anti-infectives (From admission, onward)    Start     Dose/Rate Route Frequency Ordered Stop   05/11/24 1530  doxycycline  (VIBRA -TABS) tablet 100 mg  Status:  Discontinued        100 mg Oral Every 12 hours 05/11/24 1436 05/11/24 1436   05/11/24 1530  doxycycline  (VIBRA -TABS) tablet 100 mg        100 mg Oral Every 12 hours 05/11/24 1436 05/18/24 0959   05/09/24 1200  vancomycin  (VANCOREADY) IVPB 750 mg/150 mL  Status:  Discontinued        750 mg 150 mL/hr over 60 Minutes Intravenous Every 8 hours 05/09/24 1107 05/11/24 1436   05/09/24 0000  vancomycin  (VANCOREADY) IVPB 1250 mg/250 mL  Status:  Discontinued        1,250 mg 166.7 mL/hr over 90 Minutes Intravenous Every 12 hours 05/08/24 2142 05/09/24 1107   05/08/24 2200  ceFEPIme  (MAXIPIME ) 2 g in sodium chloride  0.9 % 100 mL IVPB  Status:  Discontinued        2 g 200 mL/hr over 30 Minutes Intravenous Every 8 hours 05/08/24 2142 05/11/24 1436   05/08/24 1630  ceFEPIme  (MAXIPIME ) 2 g in sodium chloride  0.9 % 100 mL IVPB        2 g 200 mL/hr over 30 Minutes Intravenous  Once 05/08/24 1619 05/08/24 1700   05/08/24 1630  vancomycin   (VANCOREADY) IVPB 2000 mg/400 mL        2,000 mg 200 mL/hr over 120 Minutes Intravenous  Once 05/08/24 1619 05/08/24 1947       Data Reviewed: I have personally reviewed following labs and imaging studies CBC: Recent Labs  Lab 05/08/24 1422 05/09/24 0447 05/10/24 0506  WBC 12.1* 9.4 9.2  NEUTROABS 7.9*  --   --   HGB 14.3 13.0 13.4  HCT 43.2 40.5 42.1  MCV 87.4 88.8 89.4  PLT 250 218 265   Basic Metabolic Panel: Recent Labs  Lab 05/08/24 1422 05/09/24 0447 05/10/24 0506 05/11/24 0204  NA 130* 134* 134* 136  K 4.0 4.2 4.5 4.3  CL 98 105 105 105  CO2 24 24 22 25   GLUCOSE 150* 104* 102* 128*  BUN 17 15 12 14   CREATININE 1.34* 1.13 1.20  1.07  CALCIUM  9.7 9.0 9.3 9.2   GFR: Estimated Creatinine Clearance: 110 mL/min (by C-G formula based on SCr of 1.07 mg/dL). Liver Function Tests: Recent Labs  Lab 05/08/24 1559  AST 23  ALT 15  ALKPHOS 67  BILITOT 1.3*  PROT 7.5  ALBUMIN 3.1*   CBG: Recent Labs  Lab 05/09/24 0807  GLUCAP 98    Recent Results (from the past 240 hours)  Blood Culture (routine x 2)     Status: None (Preliminary result)   Collection Time: 05/08/24  3:55 PM   Specimen: BLOOD  Result Value Ref Range Status   Specimen Description BLOOD RIGHT ANTECUBITAL  Final   Special Requests   Final    BOTTLES DRAWN AEROBIC AND ANAEROBIC Blood Culture results may not be optimal due to an inadequate volume of blood received in culture bottles   Culture   Final    NO GROWTH 3 DAYS Performed at [facility], [address] [place], [place] [address]    Report Status PENDING  Incomplete  Blood Culture (routine x 2)     Status: None (Preliminary result)   Collection Time: 05/08/24  3:55 PM   Specimen: BLOOD  Result Value Ref Range Status   Specimen Description BLOOD BLOOD RIGHT FOREARM  Final   Special Requests   Final    BOTTLES DRAWN AEROBIC ONLY Blood Culture results may not be optimal due to an inadequate volume of blood received in  culture bottles   Culture   Final    NO GROWTH 3 DAYS Performed at [facility], [address] [place], [place] [address]    Report Status PENDING  Incomplete  Respiratory (~20 pathogens) panel by PCR     Status: None   Collection Time: 05/08/24  3:55 PM   Specimen: Nasopharyngeal Swab; Respiratory  Result Value Ref Range Status   Adenovirus NOT DETECTED NOT DETECTED Final   Coronavirus 229E NOT DETECTED NOT DETECTED Final    Comment: (NOTE) The Coronavirus on the Respiratory Panel, DOES NOT test for the novel  Coronavirus (2019 nCoV)    Coronavirus HKU1 NOT DETECTED NOT DETECTED Final   Coronavirus NL63 NOT DETECTED NOT DETECTED Final   Coronavirus OC43 NOT DETECTED NOT DETECTED Final   Metapneumovirus NOT DETECTED NOT DETECTED Final   Rhinovirus / Enterovirus NOT DETECTED NOT DETECTED Final   Influenza A NOT DETECTED NOT DETECTED Final   Influenza B NOT DETECTED NOT DETECTED Final   Parainfluenza Virus 1 NOT DETECTED NOT DETECTED Final   Parainfluenza Virus 2 NOT DETECTED NOT DETECTED Final   Parainfluenza Virus 3 NOT DETECTED NOT DETECTED Final   Parainfluenza Virus 4 NOT DETECTED NOT DETECTED Final   Respiratory Syncytial Virus NOT DETECTED NOT DETECTED Final   Bordetella pertussis NOT DETECTED NOT DETECTED Final   Bordetella Parapertussis NOT DETECTED NOT DETECTED Final   Chlamydophila pneumoniae NOT DETECTED NOT DETECTED Final   Mycoplasma pneumoniae NOT DETECTED NOT DETECTED Final    Comment: Performed at [facility], [address], [place], [place] [address]  Aerobic Culture w Gram Stain (superficial specimen)     Status: None   Collection Time: 05/08/24  3:59 PM   Specimen: Wound  Result Value Ref Range Status   Specimen Description   Final    WOUND Performed at [facility], [address], [place], [place] [address]    Special Requests   Final    LEFT FOOT Performed at [facility], [phone]  [address] [place], [place]  [address]    Gram Stain   Final    NO WBC SEEN RARE GRAM POSITIVE COCCI Performed at [facility], [address], [place], [place] [address]    Culture FEW METHICILLIN RESISTANT STAPHYLOCOCCUS AUREUS  Final   Report Status 05/10/2024 FINAL  Final   Organism ID, Bacteria METHICILLIN RESISTANT STAPHYLOCOCCUS AUREUS  Final      Susceptibility   Methicillin resistant staphylococcus aureus - MIC*    CIPROFLOXACIN  >=8 RESISTANT Resistant     ERYTHROMYCIN  >=8 RESISTANT Resistant     GENTAMICIN  <=0.5 SENSITIVE Sensitive     OXACILLIN >=4 RESISTANT Resistant     TETRACYCLINE <=1 SENSITIVE Sensitive     VANCOMYCIN  1 SENSITIVE Sensitive     TRIMETH /SULFA  >=320 RESISTANT Resistant     CLINDAMYCIN <=0.25 SENSITIVE Sensitive     RIFAMPIN <=0.5 SENSITIVE Sensitive     Inducible Clindamycin NEGATIVE Sensitive     LINEZOLID 2 SENSITIVE Sensitive     * FEW METHICILLIN RESISTANT STAPHYLOCOCCUS AUREUS     Radiology Studies: DG Foot 2 Views Left Result Date: 05/11/2024 CLINICAL DATA:  Postop. EXAM: LEFT FOOT - 2 VIEW COMPARISON:  05/08/2024 FINDINGS: Interval transmetatarsal amputation toes 1 through 5. Skin staples over the distal soft tissues. Remainder the exam is unchanged. IMPRESSION: Interval transmetatarsal amputation toes 1 through 5. Electronically Signed   By: Toribio Agreste M.D.   On: 05/11/2024 15:14    Scheduled Meds:  aspirin  EC  81 mg Oral Daily   atorvastatin   80 mg Oral Daily   carvedilol   3.125 mg Oral Q12H   doxycycline   100 mg Oral Q12H   guaiFENesin   600 mg Oral BID   mycophenolate   500 mg Oral BID   pantoprazole   40 mg Oral Daily   predniSONE   5 mg Oral Daily   tacrolimus   1.5 mg Oral q morning   And   tacrolimus   1 mg Oral QHS   Continuous Infusions:     LOS: 3 days  MDM: Patient is high risk for one or more organ failure.  They necessitate ongoing hospitalization for continued IV therapies and subsequent lab monitoring. Total time spent interpreting labs and  vitals, reviewing the medical record, coordinating care amongst consultants and care team members, directly assessing and discussing care with the patient and/or family: 55 min  Laree Lock, MD [facility]  To contact the attending physician between 7A-7P please use Epic Chat. To contact the covering physician during after hours 7P-7A, please review Amion.  05/11/2024, 3:56 PM   *This document has been created with the assistance of dictation software. Please excuse typographical errors. *

## 2024-05-11 NOTE — Anesthesia Procedure Notes (Signed)
 Procedure Name: LMA Insertion Date/Time: 05/11/2024 10:18 AM  Performed by: Germaine Maeola CROME, CRNAPre-anesthesia Checklist: Patient identified, Emergency Drugs available, Suction available and Patient being monitored Patient Re-evaluated:Patient Re-evaluated prior to induction Oxygen Delivery Method: Circle system utilized Preoxygenation: Pre-oxygenation with 100% oxygen Induction Type: IV induction Ventilation: Mask ventilation without difficulty LMA: LMA inserted LMA Size: 5.0 Tube type: Oral Number of attempts: 1 Placement Confirmation: positive ETCO2 and breath sounds checked- equal and bilateral Tube secured with: Tape Dental Injury: Teeth and Oropharynx as per pre-operative assessment

## 2024-05-11 NOTE — Transfer of Care (Signed)
 Immediate Anesthesia Transfer of Care Note  Patient: [name]  Procedure(s) Performed: AMPUTATION, FOOT, TRANSMETATARSAL (Left) LENGTHENING, TENDON, ACHILLES (Left: Foot)  Patient Location: PACU  Anesthesia Type:General  Level of Consciousness: awake and drowsy  Airway & Oxygen Therapy: Patient Spontanous Breathing and Patient connected to face mask oxygen  Post-op Assessment: Report given to RN and Post -op Vital signs reviewed and stable  Post vital signs: Reviewed and stable  Last Vitals:  Vitals Value Taken Time  BP 122/48 05/11/24 11:02  Temp    Pulse 72 05/11/24 11:05  Resp 19 05/11/24 11:05  SpO2 96 % 05/11/24 11:05  Vitals shown include unfiled device data.  Last Pain:  Vitals:   05/11/24 0902  TempSrc: Temporal  PainSc: 4          Complications: No notable events documented.

## 2024-05-11 NOTE — Plan of Care (Signed)

## 2024-05-11 NOTE — Anesthesia Preprocedure Evaluation (Signed)
 Anesthesia Evaluation  Patient identified by MRN, date of birth, ID band Patient awake    Reviewed: Allergy & Precautions, NPO status , Patient's Chart, lab work & pertinent test results  History of Anesthesia Complications (+) PONV and history of anesthetic complications  Airway Mallampati: III  TM Distance: >3 FB Neck ROM: full    Dental  (+) Chipped   Pulmonary sleep apnea and Continuous Positive Airway Pressure Ventilation    Pulmonary exam normal        Cardiovascular hypertension, On Medications negative cardio ROS Normal cardiovascular exam     Neuro/Psych TIA negative psych ROS   GI/Hepatic Neg liver ROS, PUD,,,  Endo/Other  diabetes, Type 1Hypothyroidism    Renal/GU ESRFRenal diseases/p kidney and pancreas transplant  negative genitourinary   Musculoskeletal   Abdominal   Peds  Hematology negative hematology ROS (+)   Anesthesia Other Findings Past Medical History: No date: Cellulitis No date: Dialysis patient (HCC)     Comment:  M, W, F No date: DKA (diabetic ketoacidosis) (HCC) No date: Esophageal dysphagia No date: ESRD (end stage renal disease) (HCC) No date: Gastroparesis No date: Herniated cervical disc No date: HTN (hypertension) No date: Hypercholesteremia No date: Hypothyroidism No date: Morbid obesity (HCC) No date: OSA (obstructive sleep apnea) No date: Peritoneal dialysis status (HCC) No date: PONV (postoperative nausea and vomiting)     Comment:  after peritoneal dialysis catheter was placed No date: Presence of insulin  pump     Comment:  a.) parenteral insulin  admin with pen as of 09/20/2022;               no using pump No date: S/P cardiac cath     Comment:  a. 10-15 yrs ago at [facility] due to tachycardia, reportedly               normal. b. Normal ETT 03/2012. No date: Sepsis (HCC) No date: Sinus tachycardia     Comment:  a. 24-hr Holter 03/2012 - SR, occ PVCs, no VT, avg HR                96bpm. No date: TIA (transient ischemic attack) No date: Type 1 diabetes mellitus (HCC)  Past Surgical History: 09/14/2022: A/V FISTULAGRAM; Left     Comment:  Procedure: A/V Fistulagram;  Surgeon: Jama Cordella MATSU, MD;  Location: [facility] INVASIVE CV LAB;  Service:               Cardiovascular;  Laterality: Left; 06/30/2022: AV FISTULA PLACEMENT; Left     Comment:  Procedure: ARTERIOVENOUS (AV) FISTULA CREATION               (BRACHIALCEPHALIC);  Surgeon: Jama Cordella MATSU, MD;                Location: [facility] ORS;  Service: Vascular;  Laterality:               Left; 10/03/2019: BIOPSY     Comment:  Procedure: BIOPSY;  Surgeon: Shila Gustav GAILS, MD;                Location: [facility]OS[facility];  Service: Endoscopy;; No date: CATARACT EXTRACTION; Bilateral No date: cervical neck fusion 01/02/2021: COLONOSCOPY WITH PROPOFOL ; N/A     Comment:  Procedure: COLONOSCOPY WITH PROPOFOL ;  Surgeon: Jinny Carmine, MD;  Location: [facility];  Service:               Endoscopy;  Laterality: N/A;  priority 4 DIABETIC needs               potassium draw 10/03/2019: ESOPHAGOGASTRODUODENOSCOPY (EGD) WITH PROPOFOL ; N/A     Comment:  Procedure: ESOPHAGOGASTRODUODENOSCOPY (EGD) WITH               PROPOFOL ;  Surgeon: Shila Gustav GAILS, MD;  Location:               [facility] ENDOSCOPY;  Service: Endoscopy;  Laterality: N/A; No date: EYE SURGERY; Bilateral     Comment:  for floaters No date: HERNIA REPAIR     Comment:  bilateral 10/16/2021: LEFT HEART CATH AND CORONARY ANGIOGRAPHY; Left     Comment:  Procedure: LEFT HEART CATH AND CORONARY ANGIOGRAPHY;                Surgeon: Darron Deatrice LABOR, MD;  Location: [facility] INVASIVE               CV LAB;  Service: Cardiovascular;  Laterality: Left; 12/18/2013: LEFT HEART CATHETERIZATION WITH CORONARY ANGIOGRAM; N/A     Comment:  Procedure: LEFT HEART CATHETERIZATION WITH CORONARY               ANGIOGRAM;  Surgeon: [name] M Swaziland, MD;   Location: [facility]               CATH LAB;  Service: Cardiovascular;  Laterality: N/A; No date: PERITONEAL CATHETER INSERTION No date: PERITONEAL CATHETER REMOVAL 01/02/2021: POLYPECTOMY     Comment:  Procedure: POLYPECTOMY;  Surgeon: Jinny Carmine, MD;                Location: [facility];  Service: Endoscopy;; 09/24/2022: REVISON OF ARTERIOVENOUS FISTULA; Left     Comment:  Procedure: REVISON OF ARTERIOVENOUS FISTULA (BRACHIAL               CEPHALIC WITH ARTEGRAFT);  Surgeon: Jama Cordella MATSU,               MD;  Location: [facility] ORS;  Service: Vascular;  Laterality:              Left;  BMI    Body Mass Index: 36.36 kg/m      Reproductive/Obstetrics negative OB ROS                              Anesthesia Physical Anesthesia Plan  ASA: 3  Anesthesia Plan: General   Post-op Pain Management:    Induction: Intravenous  PONV Risk Score and Plan: 2 and Propofol  infusion and TIVA  Airway Management Planned: Natural Airway and Nasal Cannula  Additional Equipment:   Intra-op Plan:   Post-operative Plan:   Informed Consent: I have reviewed the patients History and Physical, chart, labs and discussed the procedure including the risks, benefits and alternatives for the proposed anesthesia with the patient or authorized representative who has indicated his/her understanding and acceptance.     Dental Advisory Given  Plan Discussed with: Anesthesiologist, CRNA and Surgeon  Anesthesia Plan Comments: (Patient consented for risks of anesthesia including but not limited to:  - adverse reactions to medications - risk of airway placement if required - damage to eyes, teeth, lips or other oral mucosa - nerve damage due to positioning  - sore throat or hoarseness - Damage to heart, brain, nerves, lungs,  other parts of body or loss of life  Patient voiced understanding and assent.)         Anesthesia Quick Evaluation

## 2024-05-11 NOTE — Progress Notes (Signed)
 History and Physical Interval Note:  05/11/2024 10:04 AM  [name]  has presented today for surgery, with the diagnosis of osteomyelitis and abscess of 5th ray left foot with plantar foot ulceration  Discussed with patient pre op and he informed me he has elected to proceed with transmetatarsal amputation to achieve wound closure which I think is reasonable. Also recommend tendon lengthening due to equinus deformity of left ankle . He agrees.   .  The various methods of treatment have been discussed with the patient and family. After consideration of risks, benefits and other options for treatment, the patient has consented to   Procedure(s) with comments: TRANSMETATARSAL amputation and tendo achilles lengthening  as a surgical intervention.  The patient's history has been reviewed, patient examined, no change in status, stable for surgery.  I have reviewed the patient's chart and labs.  Questions were answered to the patient's satisfaction.     [name]

## 2024-05-11 NOTE — Op Note (Signed)
 Full Operative Report  Date of Operation: 10:59 AM, 05/11/2024   Patient: [name] - 53 y.o. male  Surgeon: [name], DPM   Assistant: None  Diagnosis: Ulceration and osteomyelitis of left foot, equinus of left ankle  Procedure:  1. Transmetatarsal amputation of left foot 2. Tendo achilles lengthening of left ankle    Anesthesia: Anesthesia type not filed in the log.  Chesley Lendia CROME, MD  Anesthesiologist: Chesley Lendia CROME, MD CRNA: Germaine Maeola CROME, CRNA   Estimated Blood Loss: 75 mL  Hemostasis: 1) Anatomical dissection, mechanical compression, electrocautery 2) Thigh tourniquet  to 300 mm Hg x 13 min  Implants: * No implants in log *  Materials: Prolene 2-0 and skin staples  Injectables: 1) Pre-operatively: 20 cc of 50:50 mixture 1%lidocaine  plain and 0.5% marcaine  plain 2) Post-operatively: None   Specimens: - Pathology: left forefoot for pathology - Microbiology: bone culture left 5th metatarsal head   Antibiotics: IV antibiotics given per schedule on the floor  Drains: None  Complications: Patient tolerated the procedure well without complication.   Operative findings: As below in detailed report  Indications for Procedure: [name] presents to [name], DPM with a chief complaint of osteomyelitis of the left 5th ray with ulceration plantar 4th and 5th met head with prior hallux amputation. He has elected for TMA with TAL to achieve wound closure. The patient has failed conservative treatments of various modalities. At this time the patient has elected to proceed with surgical correction. All alternatives, risks, and complications of the procedures were thoroughly explained to the patient. Patient exhibits appropriate understanding of all discussion points and informed consent was signed and obtained in the chart with no guarantees to surgical outcome given or implied.  Description of Procedure: Patient was  brought to the operating room. Patient remained on their hospital bed in the supine position. A surgical timeout was performed and all members of the operating room, the procedure, and the surgical site were identified. anesthesia occurred as per anesthesia record. Local anesthetic as previously described was then injected about the operative field in a local infiltrative block.  The operative lower extremity as noted above was then prepped and draped in the usual sterile manner. The following procedure then began.  Attention was directed to the posterior LEFT ankle where the Achilles tendon was identified using topographical anatomy.  Here, a triple hemisection was performed to lengthen the Achilles tendon and thus improve dorsiflexion of the foot. Two centimeters from the Achilles insertion, a 15 blade was inserted parallel with the long axis of the tendon and then rotated 90 degrees medially to release the medial half of the tendon.  In a similar fashion, the 15 blade was then inserted midline of the Achilles tendon 4 cm from the insertion and rotated 90 degrees laterally to transect the lateral half of the tendon.  Lastly, the 15 blade was inserted midline of the tendon 6 cm proximal from the insertion and rotated 90 degrees medially to release the medial half of the tendon. This was all done under passive dorsiflexion and an increase in ankle range of motion in the sagittal plane was noted.  All 3 sites were then flushed with sterile normal saline and closed with the suture material previously described.   Attention was directed to the LEFT lower extremity. A fish-mouth type incision was made proximal to the web spaces and encompassed the entire forefoot. The full-thickness incision was made with a longer dorsal  flap to allow for wound closure in order to excise the platnar ulceration. The incision was continued through the soft tissue down to the shafts of the metatarsal bones. A 15 blade was then used to  free up the periosteum on all the metatarsal shafts. Using an oscillating saw, the metatarsals were cut in a dorsal distal to plantar proximal orientation. The first metatarsal was beveled so that the medial cortex was shorter than the lateral, and the fifth metatarsal was beveled so that the lateral cortex was shorter than the medial, thus less prominent. The amputation was done so that a metatarsal parabola was maintained.  The distal portion including all the digits were freed from the metatarsals and soft tissue attachments. The specimen was passed off the field and sent for gross pathology. All remaining non-viable and necrotic tissues were sharply resected and removed. Extensor and flexors tendons were grasped with a hemostat and cut proximally. Good bleeding was seen prior to inflation of the tourniquet.   The surgical site was then flushed with 1000ml of saline under power pulse lavage. The dorsal flap was brought in approximation with the plantar flap and the sutures material previously described was used for closure. Care was taken not to place the flaps under tension in order not to jeopardize the vascular supply.  The surgical site was then dressed with xeroform 4x4 gauze abd pad kerlix and ace wrap. The patient tolerated both the procedure and anesthesia well with vital signs stable throughout. The patient was transferred in good condition and all vital signs stable  from the OR to recovery under the discretion of anesthesia.  Condition: Vital signs stable, neurovascular status unchanged from preoperative   Surgical plan:  Probable clean margin, 5th metatarsal head was necrotic and soft consistent with OM and I believe bone was healthy where I made the 5th metatarsal cut. NWB in CAM boot while working with PT. Recommend 7 days doxycyline from the procedure. Leave dressing C/D/I, elevate.   The patient will be NWB in a CAM boot (does not need to wear while in bed) to the operative limb until  further instructed. The dressing is to remain clean, dry, and intact. Will continue to follow unless noted elsewhere.   Marsa Honour, DPM [facility]

## 2024-05-12 DIAGNOSIS — L0390 Cellulitis, unspecified: Secondary | ICD-10-CM | POA: Diagnosis not present

## 2024-05-12 DIAGNOSIS — A419 Sepsis, unspecified organism: Secondary | ICD-10-CM | POA: Diagnosis not present

## 2024-05-12 LAB — CBC
HCT: 41.5 % (ref 39.0–52.0)
Hemoglobin: 13.4 g/dL (ref 13.0–17.0)
MCH: 28.9 pg (ref 26.0–34.0)
MCHC: 32.3 g/dL (ref 30.0–36.0)
MCV: 89.4 fL (ref 80.0–100.0)
Platelets: 312 K/uL (ref 150–400)
RBC: 4.64 MIL/uL (ref 4.22–5.81)
RDW: 13.6 % (ref 11.5–15.5)
WBC: 13.7 K/uL — ABNORMAL HIGH (ref 4.0–10.5)
nRBC: 0 % (ref 0.0–0.2)

## 2024-05-12 LAB — BASIC METABOLIC PANEL WITH GFR
Anion gap: 9 (ref 5–15)
BUN: 14 mg/dL (ref 6–20)
CO2: 23 mmol/L (ref 22–32)
Calcium: 9.4 mg/dL (ref 8.9–10.3)
Chloride: 102 mmol/L (ref 98–111)
Creatinine, Ser: 1.32 mg/dL — ABNORMAL HIGH (ref 0.61–1.24)
GFR, Estimated: >60 mL/min (ref >60)
Glucose, Bld: 155 mg/dL — ABNORMAL HIGH (ref 70–99)
Potassium: 4.4 mmol/L (ref 3.5–5.1)
Sodium: 134 mmol/L — ABNORMAL LOW (ref 135–145)

## 2024-05-12 LAB — GLUCOSE, CAPILLARY
Glucose-Capillary: 145 mg/dL — ABNORMAL HIGH (ref 70–99)
Glucose-Capillary: 120 mg/dL — ABNORMAL HIGH (ref 70–99)
Glucose-Capillary: 150 mg/dL — ABNORMAL HIGH (ref 70–99)

## 2024-05-12 MED ORDER — LACTATED RINGERS IV BOLUS
500.0000 mL | Freq: Once | INTRAVENOUS | Status: AC
  Administered 2024-05-12: 500 mL via INTRAVENOUS

## 2024-05-12 MED ORDER — LACTATED RINGERS IV SOLN: INTRAVENOUS | Status: DC

## 2024-05-12 NOTE — Plan of Care (Signed)

## 2024-05-12 NOTE — Progress Notes (Signed)
 PROGRESS NOTE    [name]  FMW:[id number] DOB: [birth year] DOA: 05/08/2024 PCP: Rudolpho Norleen JONETTA, MD  Chief Complaint  Patient presents with   Pneumonia    Hospital Course:  [name] is a 53 y.o. 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   Patient admitted due to sepsis due to left foot cellulitis, lower extremity infection metatarsal head.  S/p TMA left foot and tendo achilles lengthening left ankle  Subjective: Patient was examined at the bedside, states he had felt some weird sensation like something popping in his left foot earlier today, denies any symptoms after Hypotensive at noon, will give LR bolus.  Continue IV fluids for elevated creatinine Anticipate discharge tomorrow   Objective: Vitals:   05/11/24 2032 05/12/24 0043 05/12/24 0736 05/12/24 1512  BP: 138/63 133/75 (!) 147/64 (!) 92/57  Pulse: 83 74 66   Resp: 16 16 17    Temp: 97.6 F (36.4 C) 97.8 F (36.6 C) 97.7 F (36.5 C)   TempSrc: Oral  Oral   SpO2: 94% 93% 97%   Weight:      Height:        Intake/Output Summary (Last 24 hours) at 05/12/2024 1600 Last data filed at 05/12/2024 1300 Gross per 24 hour  Intake 420 ml  Output 675 ml  Net -255 ml   Filed Weights   05/08/24 1419 05/11/24 0611  Weight: 114.8 kg 121.6 kg    Examination: Constitutional:      General: He is not in acute distress. HENT:     Head: Normocephalic and atraumatic.  Cardiovascular:     Rate and Rhythm: Normal rate and regular rhythm.     Heart sounds: Normal heart sounds.  Pulmonary:     Effort: Pulmonary effort is normal.     Breath  sounds: Normal breath sounds.  Abdominal:     Palpations: Abdomen is soft.     Tenderness: There is no abdominal tenderness.  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/2022 (HCC)   Benign essential hypertension   Hypothyroidism   Presence of insulin  pump   Immunosuppression (HCC)   OSA (obstructive sleep apnea)   Ulcer of left foot with fat layer exposed (HCC)   Pyogenic inflammation of bone (HCC)   Equinus contracture of left ankle   Sepsis due to left foot cellulitis Osteomyelitis and abscess of 5th ray left foot with plantar foot ulceration  S/p TMA left foot and Tendo achilles lengthening left ankle -Sepsis criteria include fever, tachycardia with source of infection -Patient failed outpatient management with Levaquin  -MRI Left foot fifth MTP compatible with osteomyelitis/septic arthritis.  No abscess.  Flattening of the second metatarsal wound findings suggestive of avascular necrosis -US   Arterial ABI nondiagnostic due to incompressible vessels -Wcx 08/05 Rare GPC, Few MRSA -was on IV cefepime  and vancomycin , changed to po doxycycline  to complete another 7 days -Seen by Podiatry, appreciate recs. S/p TMA left foot and Tendo achilles lengthening left ankle - Refused angiogram, Vascular surgery consult pending -Wound care at bedside, weightbearing as tolerated - Pain control - Follow wound culture, blood culture, Intra-op pathology pending   Chronic cough Nausea with dry heaving Patient with 3 months of unresolved cough, treated with antibiotics No evidence  of pneumonia or bronchitis on chest x-ray Antitussives as needed.  Albuterol  if wheezing  Empiric PPI for possible GERD Outpatient follow-up-can consider outpatient PFTs/referral to pulmonologist  Status post simultaneous kidney and pancreas transplant [facility]) Chronic immunosuppression Transplant MD, Dr. Adina Lever at [facility] was contacted and said okay to continue all immunosuppressants Continue tacrolimus , prednisone  and CellCept   Elevated Cr - does not meet criteria for AKI, suspect pre-renal - IV fluids  Type 1 diabetes s/p pancreas transplant 10/2022 [facility]) No longer needs insulin  A1c 6.0 on 04/03/24   OSA (obstructive sleep apnea) CPAP at bedtime if desired   Benign essential hypertension Hypotensive today - LR 500 ml bolus - Coreg  with holding parameters if BP improves  DVT prophylaxis:  SCD's   Code Status: Full Code Disposition:  TBD  Consultants:  Treatment Team:  Consulting Physician: [name], DPM  Procedures:  S/p TMA left foot and Tendo achilles lengthening left ankle  Antimicrobials:  Anti-infectives (From admission, onward)    Start     Dose/Rate Route Frequency Ordered Stop   05/11/24 1530  doxycycline  (VIBRA -TABS) tablet 100 mg  Status:  Discontinued        100 mg Oral Every 12 hours 05/11/24 1436 05/11/24 1436   05/11/24 1530  doxycycline  (VIBRA -TABS) tablet 100 mg        100 mg Oral Every 12 hours 05/11/24 1436 05/18/24 0959   05/09/24 1200  vancomycin  (VANCOREADY) IVPB 750 mg/150 mL  Status:  Discontinued        750 mg 150 mL/hr over 60 Minutes Intravenous Every 8 hours 05/09/24 1107 05/11/24 1436   05/09/24 0000  vancomycin  (VANCOREADY) IVPB 1250 mg/250 mL  Status:  Discontinued        1,250 mg 166.7 mL/hr over 90 Minutes Intravenous Every 12 hours 05/08/24 2142 05/09/24 1107   05/08/24 2200  ceFEPIme  (MAXIPIME ) 2 g in sodium chloride  0.9 % 100 mL IVPB  Status:  Discontinued        2 g 200 mL/hr over 30 Minutes Intravenous Every 8 hours  05/08/24 2142 05/11/24 1436   05/08/24 1630  ceFEPIme  (MAXIPIME ) 2 g in sodium chloride  0.9 % 100 mL IVPB        2 g 200 mL/hr over 30 Minutes Intravenous  Once 05/08/24 1619 05/08/24 1700   05/08/24 1630  vancomycin  (VANCOREADY) IVPB 2000 mg/400 mL        2,000 mg 200 mL/hr over 120 Minutes Intravenous  Once 05/08/24 1619 05/08/24 1947       Data Reviewed: I have personally reviewed following labs and imaging studies CBC: Recent Labs  Lab 05/08/24 1422 05/09/24 0447 05/10/24 0506 05/12/24 0309  WBC 12.1* 9.4 9.2 13.7*  NEUTROABS 7.9*  --   --   --   HGB 14.3 13.0 13.4 13.4  HCT 43.2 40.5 42.1 41.5  MCV 87.4 88.8 89.4 89.4  PLT 250 218 265 312   Basic Metabolic Panel: Recent Labs  Lab 05/08/24 1422  05/09/24 0447 05/10/24 0506 05/11/24 0204 05/12/24 0309  NA 130* 134* 134* 136 134*  K 4.0 4.2 4.5 4.3 4.4  CL 98 105 105 105 102  CO2 24 24 22 25 23   GLUCOSE 150* 104* 102* 128* 155*  BUN 17 15 12 14 14   CREATININE 1.34* 1.13 1.20 1.07 1.32*  CALCIUM  9.7 9.0 9.3 9.2 9.4   GFR: Estimated Creatinine Clearance: 89.1 mL/min (A) (by C-G formula based on SCr of 1.32 mg/dL (H)). Liver Function Tests: Recent Labs  Lab 05/08/24 1559  AST 23  ALT 15  ALKPHOS 67  BILITOT 1.3*  PROT 7.5  ALBUMIN 3.1*   CBG: Recent Labs  Lab 05/09/24 0807 05/12/24 0738 05/12/24 1150  GLUCAP 98 145* 120*    Recent Results (from the past 240 hours)  Blood Culture (routine x 2)     Status: None (Preliminary result)   Collection Time: 05/08/24  3:55 PM   Specimen: BLOOD  Result Value Ref Range Status   Specimen Description BLOOD RIGHT ANTECUBITAL  Final   Special Requests   Final    BOTTLES DRAWN AEROBIC AND ANAEROBIC Blood Culture results may not be optimal due to an inadequate volume of blood received in culture bottles   Culture   Final    NO GROWTH 4 DAYS Performed at [facility], [address], [place], [place] [address]    Report Status PENDING  Incomplete   Blood Culture (routine x 2)     Status: None (Preliminary result)   Collection Time: 05/08/24  3:55 PM   Specimen: BLOOD  Result Value Ref Range Status   Specimen Description BLOOD BLOOD RIGHT FOREARM  Final   Special Requests   Final    BOTTLES DRAWN AEROBIC ONLY Blood Culture results may not be optimal due to an inadequate volume of blood received in culture bottles   Culture   Final    NO GROWTH 4 DAYS Performed at [facility], [address] [place], [place] [address]    Report Status PENDING  Incomplete  Respiratory (~20 pathogens) panel by PCR     Status: None   Collection Time: 05/08/24  3:55 PM   Specimen: Nasopharyngeal Swab; Respiratory  Result Value Ref Range Status   Adenovirus NOT DETECTED NOT DETECTED Final   Coronavirus 229E NOT DETECTED NOT DETECTED Final    Comment: (NOTE) The Coronavirus on the Respiratory Panel, DOES NOT test for the novel  Coronavirus (2019 nCoV)    Coronavirus HKU1 NOT DETECTED NOT DETECTED Final   Coronavirus NL63 NOT DETECTED NOT DETECTED Final   Coronavirus OC43 NOT DETECTED NOT DETECTED Final   Metapneumovirus NOT DETECTED NOT DETECTED Final   Rhinovirus / Enterovirus NOT DETECTED NOT DETECTED Final   Influenza A NOT DETECTED NOT DETECTED Final   Influenza B NOT DETECTED NOT DETECTED Final   Parainfluenza Virus 1 NOT DETECTED NOT DETECTED Final   Parainfluenza Virus 2 NOT DETECTED NOT DETECTED Final   Parainfluenza Virus 3 NOT DETECTED NOT DETECTED Final   Parainfluenza Virus 4 NOT DETECTED NOT DETECTED Final   Respiratory Syncytial Virus NOT DETECTED NOT DETECTED Final   Bordetella pertussis NOT DETECTED NOT DETECTED Final   Bordetella Parapertussis NOT DETECTED NOT DETECTED Final   Chlamydophila pneumoniae NOT DETECTED NOT DETECTED Final   Mycoplasma pneumoniae NOT DETECTED NOT DETECTED Final    Comment: Performed at [facility], [address], [place], [place] [address]  Aerobic Culture w Gram Stain (superficial  specimen)  Status: None   Collection Time: 05/08/24  3:59 PM   Specimen: Wound  Result Value Ref Range Status   Specimen Description   Final    WOUND Performed at [facility], [address], [place], [place] [address]    Special Requests   Final    LEFT FOOT Performed at [facility], [address] [place], [place] [address]    Gram Stain   Final    NO WBC SEEN RARE GRAM POSITIVE COCCI Performed at [facility], [address] [place], [place] [address]    Culture FEW METHICILLIN RESISTANT STAPHYLOCOCCUS AUREUS  Final   Report Status 05/10/2024 FINAL  Final   Organism ID, Bacteria METHICILLIN RESISTANT STAPHYLOCOCCUS AUREUS  Final      Susceptibility   Methicillin resistant staphylococcus aureus - MIC*    CIPROFLOXACIN  >=8 RESISTANT Resistant     ERYTHROMYCIN  >=8 RESISTANT Resistant     GENTAMICIN  <=0.5 SENSITIVE Sensitive     OXACILLIN >=4 RESISTANT Resistant     TETRACYCLINE <=1 SENSITIVE Sensitive     VANCOMYCIN  1 SENSITIVE Sensitive     TRIMETH /SULFA  >=320 RESISTANT Resistant     CLINDAMYCIN <=0.25 SENSITIVE Sensitive     RIFAMPIN <=0.5 SENSITIVE Sensitive     Inducible Clindamycin NEGATIVE Sensitive     LINEZOLID 2 SENSITIVE Sensitive     * FEW METHICILLIN RESISTANT STAPHYLOCOCCUS AUREUS  Aerobic/Anaerobic Culture w Gram Stain (surgical/deep wound)     Status: None (Preliminary result)   Collection Time: 05/11/24  8:47 AM   Specimen: PATH Digit amputation; Tissue  Result Value Ref Range Status   Specimen Description   Final    TISSUE Performed at [facility], [address] [place], [place] [address]    Special Requests   Final    LEFT 5TH METATARSEL HEAD Performed at [facility], [address] [place], [place] [address]    Gram Stain NO WBC SEEN NO ORGANISMS SEEN   Final   Culture   Final    NO GROWTH < 24 HOURS Performed at [facility], [address] [place], [place] [address]    Report  Status PENDING  Incomplete     Radiology Studies: DG Foot 2 Views Left Result Date: 05/11/2024 CLINICAL DATA:  Postop. EXAM: LEFT FOOT - 2 VIEW COMPARISON:  05/08/2024 FINDINGS: Interval transmetatarsal amputation toes 1 through 5. Skin staples over the distal soft tissues. Remainder the exam is unchanged. IMPRESSION: Interval transmetatarsal amputation toes 1 through 5. Electronically Signed   By: Toribio Agreste M.D.   On: 05/11/2024 15:14    Scheduled Meds:  aspirin  EC  81 mg Oral Daily   atorvastatin   80 mg Oral Daily   carvedilol   3.125 mg Oral Q12H   doxycycline   100 mg Oral Q12H   guaiFENesin   600 mg Oral BID   mycophenolate   500 mg Oral BID   pantoprazole   40 mg Oral Daily   predniSONE   5 mg Oral Daily   tacrolimus   1.5 mg Oral q morning   And   tacrolimus   1 mg Oral QHS   Continuous Infusions:  lactated ringers      lactated ringers  75 mL/hr at 05/12/24 1109      LOS: 4 days  MDM: Patient is high risk for one or more organ failure.  They necessitate ongoing hospitalization for continued IV therapies and subsequent lab monitoring. Total time spent interpreting labs and vitals, reviewing the medical record, coordinating care amongst  consultants and care team members, directly assessing and discussing care with the patient and/or family: 55 min  Laree Lock, MD [facility]  To contact the attending physician between 7A-7P please use Epic Chat. To contact the covering physician during after hours 7P-7A, please review Amion.  05/12/2024, 4:00 PM   *This document has been created with the assistance of dictation software. Please excuse typographical errors. *

## 2024-05-12 NOTE — Anesthesia Postprocedure Evaluation (Signed)
 Anesthesia Post Note  Patient: [name]  Procedure(s) Performed: AMPUTATION, FOOT, TRANSMETATARSAL (Left) LENGTHENING, TENDON, ACHILLES (Left: Foot)  Patient location during evaluation: PACU Anesthesia Type: General Level of consciousness: awake and alert Pain management: pain level controlled Vital Signs Assessment: post-procedure vital signs reviewed and stable Respiratory status: spontaneous breathing, nonlabored ventilation, respiratory function stable and patient connected to nasal cannula oxygen Cardiovascular status: blood pressure returned to baseline and stable Postop Assessment: no apparent nausea or vomiting Anesthetic complications: no   No notable events documented.   Last Vitals:  Vitals:   05/12/24 0043 05/12/24 0736  BP: 133/75 (!) 147/64  Pulse: 74 66  Resp: 16 17  Temp: 36.6 C 36.5 C  SpO2: 93% 97%    Last Pain:  Vitals:   05/12/24 0736  TempSrc: Oral  PainSc:                  [name]

## 2024-05-12 NOTE — Progress Notes (Signed)
   PODIATRY PROGRESS NOTE Patient Name: [name]  DOB September 02, 1971 DOA 05/08/2024  Hospital Day: 5  Assessment:  53 y.o. male with medical history significant for Type 1 diabetes with ESRD s/p kidney and pancreas transplant (10/2022) on tacrolimus , prednisone  and CellCept , no longer on insulin , as well as history of HTN, HLD, OSA status post left foot transmetatarsal amputation with tendo Achilles lengthening procedure with Dr. Malvin on 8/18.  AF, VSS  WBC: 13.7  Wound/Bone Cultures:Pending Preliminary wound cultures did grow few MRSA  Imaging: Left foot 2 views 8/8 COMPARISON:  05/08/2024   FINDINGS: Interval transmetatarsal amputation toes 1 through 5. Skin staples over the distal soft tissues. Remainder the exam is unchanged.   IMPRESSION: Interval transmetatarsal amputation toes 1 through 5.     Electronically Signed   By: Toribio Agreste M.D.   On: 05/11/2024 15:14 Plan:  - S/p above procedures - NWB in CAM boot - Definitive cultures pending, anticipate 7 days PO doxycline for discharge - Dressing left clean, dry and intact today. Will change tomorrow if in house. - Please have patient follow up with Dr. Malvin in [place] within 1 week of discharge once medically stable - Recommend course of Percocet for postoperative pain Will continue to follow        Ethan LITTIE Saddler, DPM [facility]    Subjective:  Patient seen resting comfortably bedside.  Pain well-controlled. Dressings clean, dry and intact.  Objective:   Vitals:   05/12/24 0736 05/12/24 1512  BP: (!) 147/64 (!) 92/57  Pulse: 66   Resp: 17   Temp: 97.7 F (36.5 C)   SpO2: 97%        Latest Ref Rng & Units 05/12/2024    3:09 AM 05/10/2024    5:06 AM 05/09/2024    4:47 AM  CBC  WBC 4.0 - 10.5 K/uL 13.7  9.2  9.4   Hemoglobin 13.0 - 17.0 g/dL 86.5  86.5  86.9   Hematocrit 39.0 - 52.0 % 41.5  42.1  40.5   Platelets 150 - 400 K/uL 312  265  218        Latest Ref Rng &  Units 05/12/2024    3:09 AM 05/11/2024    2:04 AM 05/10/2024    5:06 AM  BMP  Glucose 70 - 99 mg/dL 844  871  897   BUN 6 - 20 mg/dL 14  14  12    Creatinine 0.61 - 1.24 mg/dL 8.67  8.92  8.79   Sodium 135 - 145 mmol/L 134  136  134   Potassium 3.5 - 5.1 mmol/L 4.4  4.3  4.5   Chloride 98 - 111 mmol/L 102  105  105   CO2 22 - 32 mmol/L 23  25  22    Calcium  8.9 - 10.3 mg/dL 9.4  9.2  9.3     General: AAOx3, NAD  Lower Extremity Exam  Left foot dressings clean dry and intact.  No ascending erythema or streaking lymphangitis.  No pain with posterior calf squeeze.  Negative T[place]a' sign    Radiology:  Results reviewed. See assessment for pertinent imaging results

## 2024-05-12 NOTE — Plan of Care (Signed)

## 2024-05-12 NOTE — Evaluation (Signed)
 Physical Therapy Evaluation Patient Details Name: [name] MRN: [id number] DOB: 1971-07-21 Today's Date: 05/12/2024  History of Present Illness  [name] is a 53 y.o. 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 Patient was diagnosed with left foot cellulitis and osteomyelitis. 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 He is NWB on LLE with CAM Boot per podiatry order.  Clinical Impression  53 yo Male s/p LLE Transmetatarsal amputation of left foot and achilles lengthening of LLE. Patient lives at home with his wife. He is currently on disability. His wife works and is home intermittently throughout the day. He has 3 steps to enter house. Patient lives in 2 story home but is able to live on the main floor. He was independent in bed mobility. Per MD order he is NWB on LLE with CAM Boot. Patient was given CAM Boot however it does not currently fit appropriately (unable to close along top of foot due to thick dressing. Pt able to stand and ambulate while maintaining true NWB of LLE (foot off floor). Therefore, PT instructed patient in mobility with post op shoe while reaching out to podiatry on call for clarification on orders. Patient was concerned about maintaining NWB with CAM Boot due to weight of boot. Following evaluation, Podiatry did clarify that CAM Boot is necessary and should be used when out of bed. PT notified RN and requested that strap extensions be ordered. Patient educated in gait with RW and knee scooter. He was able to maintain NWB well with both.  Patient does report feeling increased stretch and fatigue when walking with knee scooter, but was able to travel 100 feet with min A/CGA. He reports feeling more secure with RW but fatigues quickly, being limited to 20 feet. Patient would benefit from additional skilled PT intervention for additional gait training including education in stair negotiation.        If plan is discharge home, recommend the following: A little help with walking and/or transfers;A little help with bathing/dressing/bathroom;Assistance with cooking/housework;Assist for transportation;Help with stairs or ramp for entrance   Can travel by private vehicle        Equipment Recommendations Other (comment) (to be determined)  Recommendations for Other Services       Functional Status Assessment Patient has had a recent decline in their functional status and demonstrates the ability to make significant improvements in function in a reasonable and predictable amount of time.     Precautions / Restrictions Precautions Precautions: Fall Recall of Precautions/Restrictions: Intact Restrictions Weight Bearing Restrictions Per Provider Order: Yes LLE Weight Bearing Per Provider Order: Non weight bearing Other Position/Activity Restrictions: with CAM Boot      Mobility  Bed Mobility Overal bed mobility: Independent             General bed mobility comments: able to transition supine<>sit without difficulty while maintaining NWB in LLE    Transfers Overall transfer level: Needs assistance Equipment used: Rolling walker (2 wheels) Transfers: Sit to/from Stand Sit to Stand: Supervision, Contact guard assist           General transfer comment: Initially required CGA,  progressed to close stand by assist with cues for hand placement and LE position; Pt rests LLE down on ground with leg extended but does not push through LLE; transferred from bed x2 reps, from recliner x3 reps with RW and with knee scooter     Ambulation/Gait Ambulation/Gait assistance: Min assist Gait Distance (Feet): 100 Feet (20 feet with RW; 100 feet with knee scooter) Assistive device: Knee scooter, Rolling walker (2 wheels)   Gait velocity: decreased     General Gait Details: pt able to maintain NWB in LLE with RW, ambulating with hop to gait pattern while wearing post op shoe on LLE; Pt instructed in gait training with knee scooter x100 feet with min A progressing to CGA/close stand by assist with cues for upright positioning, slow down during turns and cues for positioning for transfers; Pt reports increased stretch in LLE quad as well as fatigue with prolonged positioning with knee scooter; Pt reports feeling more secure with RW, but fatigues quickly  Stairs            Wheelchair Mobility     Tilt Bed    Modified Rankin (Stroke Patients Only)       Balance Overall balance assessment: Needs assistance Sitting-balance support: No upper extremity supported, Feet supported Sitting balance-Leahy Scale: Good     Standing balance support: Bilateral upper extremity supported, Reliant on assistive device for balance Standing balance-Leahy Scale: Fair Standing balance comment: Requires intermittent CGA/min A for steady during dynamic mobility with AD while maintaining NWB LLE;                             Pertinent Vitals/Pain Pain Assessment Pain Assessment: 0-10 Pain Score: 5  Pain Location: left foot Pain Descriptors / Indicators: Aching, Sore Pain Intervention(s): Limited activity within patient's tolerance, Monitored during session, Repositioned    Home Living Family/patient expects to be discharged to:: Private residence Living Arrangements: Spouse/significant other Available Help at Discharge: Family;Available PRN/intermittently Type of Home: House Home Access: Stairs to enter Entrance Stairs-Rails: Doctor, general practice of Steps: 3 steps to enter garage (R) rail; 3  steps to enter back door with (L) handrail   Home Layout: Able to live on main level with bedroom/bathroom Home Equipment: Shower seat;BSC/3in1      Prior Function Prior Level of Function : Independent/Modified Independent             Mobility Comments: did not use any assistive device; still driving; On disability ADLs Comments: mod I for self care ADLs;     Extremity/Trunk Assessment   Upper Extremity Assessment Upper Extremity Assessment: Overall WFL for tasks assessed    Lower Extremity Assessment Lower Extremity Assessment: Generalized weakness    Cervical / Trunk Assessment Cervical / Trunk Assessment: Normal  Communication   Communication Communication: No apparent difficulties    Cognition Arousal: Alert Behavior During Therapy: WFL for tasks assessed/performed   PT - Cognitive impairments: No apparent impairments                         Following commands: Intact       Cueing Cueing Techniques: Verbal cues     General Comments General comments (skin integrity, edema, etc.): Pt complained of feeling fatigued and mildly lightheaded. Vitals assessed, BP low: 92/57, RN informed; PT also identified that IV site was draining, RN notified and redid dressing;    Exercises Other Exercises Other Exercises: Pt  educated on recommendations including lengthy discussion about appropriate assistive device. Patient also educated in role of PT and recommendations for post hospital stay   Assessment/Plan    PT Assessment Patient needs continued PT services  PT Problem List Decreased strength;Pain;Decreased activity tolerance;Decreased knowledge of use of DME;Decreased balance;Decreased safety awareness;Decreased mobility;Decreased knowledge of precautions       PT Treatment Interventions DME instruction;Balance training;Gait training;Stair training;Functional mobility training;Therapeutic activities;Therapeutic exercise;Patient/family education    PT  Goals (Current goals can be found in the Care Plan section)  Acute Rehab PT Goals Patient Stated Goal: to go home PT Goal Formulation: With patient Time For Goal Achievement: 05/26/24 Potential to Achieve Goals: Good    Frequency 7X/week     Co-evaluation               AM-PAC PT 6 Clicks Mobility  Outcome Measure Help needed turning from your back to your side while in a flat bed without using bedrails?: None Help needed moving from lying on your back to sitting on the side of a flat bed without using bedrails?: None Help needed moving to and from a bed to a chair (including a wheelchair)?: A Little Help needed standing up from a chair using your arms (e.g., wheelchair or bedside chair)?: A Little Help needed to walk in hospital room?: A Little Help needed climbing 3-5 steps with a railing? : A Lot 6 Click Score: 19    End of Session Equipment Utilized During Treatment: Gait belt Activity Tolerance: No increased pain;Patient limited by lethargy Patient left: in bed;with call bell/phone within reach;with nursing/sitter in room Nurse Communication: Mobility status PT Visit Diagnosis: Difficulty in walking, not elsewhere classified (R26.2);Pain;Muscle weakness (generalized) (M62.81) Pain - Right/Left: Left Pain - part of body: Ankle and joints of foot    Time: 1400-1510 PT Time Calculation (min) (ACUTE ONLY): 70 min   Charges:   PT Evaluation $PT Eval Low Complexity: 1 Low PT Treatments $Gait Training: 8-22 mins PT General Charges $$ ACUTE PT VISIT: 1 Visit         Brayton Baumgartner PT, DPT 05/12/2024, 4:44 PM

## 2024-05-13 ENCOUNTER — Encounter: Payer: Self-pay | Admitting: Podiatry

## 2024-05-13 ENCOUNTER — Inpatient Hospital Stay

## 2024-05-13 DIAGNOSIS — A419 Sepsis, unspecified organism: Secondary | ICD-10-CM | POA: Diagnosis not present

## 2024-05-13 DIAGNOSIS — L0390 Cellulitis, unspecified: Secondary | ICD-10-CM | POA: Diagnosis not present

## 2024-05-13 LAB — CBC
HCT: 38.4 % — ABNORMAL LOW (ref 39.0–52.0)
Hemoglobin: 12.2 g/dL — ABNORMAL LOW (ref 13.0–17.0)
MCH: 28.5 pg (ref 26.0–34.0)
MCHC: 31.8 g/dL (ref 30.0–36.0)
MCV: 89.7 fL (ref 80.0–100.0)
Platelets: 266 K/uL (ref 150–400)
RBC: 4.28 MIL/uL (ref 4.22–5.81)
RDW: 13.5 % (ref 11.5–15.5)
WBC: 9.9 K/uL (ref 4.0–10.5)
nRBC: 0 % (ref 0.0–0.2)

## 2024-05-13 LAB — BASIC METABOLIC PANEL WITH GFR
Anion gap: 6 (ref 5–15)
BUN: 19 mg/dL (ref 6–20)
CO2: 28 mmol/L (ref 22–32)
Calcium: 8.9 mg/dL (ref 8.9–10.3)
Chloride: 100 mmol/L (ref 98–111)
Creatinine, Ser: 1.27 mg/dL — ABNORMAL HIGH (ref 0.61–1.24)
GFR, Estimated: >60 mL/min (ref >60)
Glucose, Bld: 156 mg/dL — ABNORMAL HIGH (ref 70–99)
Potassium: 4 mmol/L (ref 3.5–5.1)
Sodium: 134 mmol/L — ABNORMAL LOW (ref 135–145)

## 2024-05-13 LAB — CULTURE, BLOOD (ROUTINE X 2)
Culture: NO GROWTH
Culture: NO GROWTH

## 2024-05-13 LAB — GLUCOSE, CAPILLARY
Glucose-Capillary: 124 mg/dL — ABNORMAL HIGH (ref 70–99)
Glucose-Capillary: 104 mg/dL — ABNORMAL HIGH (ref 70–99)
Glucose-Capillary: 167 mg/dL — ABNORMAL HIGH (ref 70–99)

## 2024-05-13 MED ORDER — POLYETHYLENE GLYCOL 3350 17 G PO PACK
17.0000 g | PACK | Freq: Every day | ORAL | Status: DC
  Administered 2024-05-13: 17 g via ORAL
  Filled 2024-05-13: qty 1

## 2024-05-13 MED ORDER — HYDROCODONE-ACETAMINOPHEN 5-325 MG PO TABS: 1.0000 | ORAL_TABLET | Freq: Four times a day (QID) | ORAL | 0 refills | Status: DC | PRN

## 2024-05-13 MED ORDER — POLYETHYLENE GLYCOL 3350 17 G PO PACK: 17.0000 g | PACK | Freq: Every day | ORAL | 0 refills | Status: AC

## 2024-05-13 MED ORDER — BISACODYL 10 MG RE SUPP: 10.0000 mg | Freq: Every day | RECTAL | Status: DC | PRN

## 2024-05-13 MED ORDER — BISACODYL 5 MG PO TBEC
5.0000 mg | DELAYED_RELEASE_TABLET | Freq: Every day | ORAL | Status: DC | PRN
  Administered 2024-05-13: 5 mg via ORAL
  Filled 2024-05-13: qty 1

## 2024-05-13 MED ORDER — PANTOPRAZOLE SODIUM 40 MG PO TBEC: 40.0000 mg | DELAYED_RELEASE_TABLET | Freq: Every day | ORAL | 0 refills | Status: DC

## 2024-05-13 MED ORDER — LACTATED RINGERS IV SOLN: INTRAVENOUS | Status: DC

## 2024-05-13 MED ORDER — DOXYCYCLINE HYCLATE 100 MG PO TABS
100.0000 mg | ORAL_TABLET | Freq: Two times a day (BID) | ORAL | 0 refills | Status: DC
Start: 2024-05-13 — End: 2024-05-18

## 2024-05-13 NOTE — Discharge Summary (Addendum)
 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 *  Hospital Course: [name] is a 53 y.o. 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    Patient admitted due to sepsis due to left foot cellulitis, lower extremity infection metatarsal head.  S/p TMA left foot and tendo achilles lengthening left ankle, pending definite cultures and intra op pathology, discharged po po doxycycline  to complete 7 days post procedure.  Also, clinically suggestive of left achilles rupture. Will obtain MRI  ankle and discharge to follow up outpatient.   Addendum:  DME unable to be delivered at the bedside timely and patient very upset about the situation. Has a walker and states can get wheelchair by tomorrow morning. 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  No abscess.  Flattening of the second metatarsal wound findings suggestive of avascular necrosis -US  Arterial ABI nondiagnostic due to incompressible vessels -Wcx 08/05 Rare GPC, Few MRSA, Surgical pathology pending -was on IV cefepime  and vancomycin , changed to po doxycycline  to complete another 7 days -Seen by Podiatry, appreciate recs. S/p TMA left foot and Tendo achilles lengthening left ankle - Refused angiogram, Vascular surgery consult pending - Non weight bearing, CAM boot, daily dressings - Pain control Norco, Miralax  for bowel regimen  Suspect Left achilles rupture - MRI left ankle pending - Seen by podiatry, suggest nonoperative treatment if rupture confirmed - Follow-up outpatient   Chronic cough Nausea with dry heaving - Patient with 3 months of unresolved cough, treated with antibiotics - No evidence of pneumonia or bronchitis on chest x-ray - Empiric PPI for possible GERD, with improvement   Status post simultaneous kidney and pancreas transplant (HCC) Chronic immunosuppression - Transplant MD, [facility] 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) No longer needs insulin  A1c 6.0 on 04/03/24   OSA (obstructive sleep apnea) CPAP at bedtime if desired   Benign essential hypertension - Coreg   -- Home PT/OT/RN arranged    Consultants: Podiatry Procedures performed: S/p TMA left foot and Tendo achilles lengthening left ankle  Disposition: Home health Diet recommendation:  Discharge Diet Orders (From admission, onward)     Start     Ordered   05/13/24 0000  Diet - low sodium heart healthy        05/13/24 1256           Carb modified diet DISCHARGE MEDICATION: Allergies as of 05/13/2024       Reactions   Sulfa  Antibiotics Anaphylaxis, Swelling   Pt is not allergic to iodine , has had iodine  in the past w/o premeds and w/ no problems   Oxycontin  [oxycodone  Hcl] Nausea And Vomiting   Penicillins Other (See Comments)   Tolerated 1st generation cephalosporin (CEFAZOLIN ) on 05/29/2020 & 06/30/2022 without documented ADRs.  Possible reaction many years ago per patient.  Has had PCN without issues since time of reaction.   Shellfish-derived Products Swelling   Eyes swell with shellfish Pt is not allergic to iodine , has had iodine  in the past w/o premeds and w/ no problem   Pregabalin  Other (See Comments)   Muscle twitching and jerks        Medication List     STOP taking these medications    cefdinir  300 MG capsule Commonly known as: OMNICEF    levofloxacin  500 MG tablet Commonly known as: LEVAQUIN        TAKE these medications    albuterol  108 (90 Base) MCG/ACT inhaler Commonly known as: VENTOLIN  HFA 2 puffs every 6 (six) hours as needed for wheezing.   aspirin  EC 81 MG tablet Take 81 mg by mouth daily.   atorvastatin  80 MG tablet Commonly known as: LIPITOR  Take 1 tablet (80 mg total) by mouth daily.   atorvastatin  80 MG tablet Commonly known as: LIPITOR  Take 1 tablet (80 mg total) by  mouth once daily   carvedilol  3.125 MG tablet Commonly known as: COREG  Take 3.125 mg by mouth every 12 (twelve) hours. What changed: Another medication with the same name was removed. Continue taking this medication, and follow the directions you see here.   Cholecalciferol 50 MCG (2000 UT) Tabs Take 5,000 Units by mouth daily.   doxycycline  100 MG tablet Commonly known as: VIBRA -TABS Take 1 tablet (100 mg total) by mouth 2 (two) times daily for 5 days.   HYDROcodone -acetaminophen  5-325 MG tablet Commonly known as: NORCO/VICODIN Take 1 tablet by mouth every 6 (six) hours as needed for moderate pain (pain score 4-6) or severe pain (pain score 7-10).   Multi-Vitamin tablet Take 1 tablet by mouth daily.   mycophenolate  250 MG capsule Commonly known as: CELLCEPT  Take 4 capsules (1,000 mg total) by mouth every 12 (twelve) hours.   mycophenolate  250 MG capsule Commonly known as: CELLCEPT  Take 2 capsules (500 mg total) by mouth every 12 (twelve) hours.   mycophenolate  250 MG capsule Commonly known as: CELLCEPT  Take 2 capsules (500 mg total) by mouth every 12 (twelve) hours.   pantoprazole  40 MG tablet  Commonly known as: PROTONIX  Take 1 tablet (40 mg total) by mouth daily.   polyethylene glycol 17 g packet Commonly known as: MIRALAX  / GLYCOLAX  Take 17 g by mouth daily.   predniSONE  5 MG tablet Commonly known as: DELTASONE  Take 1 tablet (5 mg total) by mouth daily.   predniSONE  5 MG tablet Commonly known as: DELTASONE  Take 1 tablet (5 mg total) by mouth once daily   tacrolimus  0.5 MG capsule Commonly known as: PROGRAF  Take 3 capsules (1.5 mg total) by mouth every morning AND 3 capsules (1.5 mg total) at bedtime. What changed: See the new instructions.   tacrolimus  0.5 MG capsule Commonly known as: PROGRAF  Take 3 capsules (1.5 mg total) by mouth every morning AND 3 capsules (1.5 mg total) at bedtime. What changed: Another medication with the same name was changed. Make  sure you understand how and when to take each.               Discharge Care Instructions  (From admission, onward)           Start     Ordered   05/13/24 0000  Leave dressing on - Keep it clean, dry, and intact until clinic visit        05/13/24 1256            Discharge Exam: Filed Weights   05/08/24 1419 05/11/24 0611  Weight: 114.8 kg 121.6 kg   Constitutional:      General: He is not in acute distress. Cardiovascular:     Rate and Rhythm: Normal rate and regular rhythm.     Heart sounds: Normal heart sounds.  Pulmonary:     Effort: Pulmonary effort is normal.     Breath sounds: Normal breath sounds.  Abdominal:     Palpations: Abdomen is soft.     Tenderness: There is no abdominal tenderness.  Musculoskeletal:     Comments: LLE post op dressing, palpable dell area of Achilles tendon Neurological:     Mental Status: Mental status is at baseline  Condition at discharge: good  The results of significant diagnostics from this hospitalization (including imaging, microbiology, ancillary and laboratory) are listed below for reference.   Imaging Studies: DG Foot 2 Views Left Result Date: 05/11/2024 CLINICAL DATA:  Postop. EXAM: LEFT FOOT - 2 VIEW COMPARISON:  05/08/2024 FINDINGS: Interval transmetatarsal amputation toes 1 through 5. Skin staples over the distal soft tissues. Remainder the exam is unchanged. IMPRESSION: Interval transmetatarsal amputation toes 1 through 5. Electronically Signed   By: Toribio Agreste M.D.   On: 05/11/2024 15:14   US  ARTERIAL ABI (SCREENING LOWER EXTREMITY) Result Date: 05/09/2024 CLINICAL DATA:  LEFT first toe amputation March 2024. Foot ulcer. Hypertension. LEFT claudication. EXAM: NONINVASIVE PHYSIOLOGIC VASCULAR STUDY OF BILATERAL LOWER EXTREMITIES TECHNIQUE: Evaluation of both lower extremities were performed at rest, including calculation of ankle-brachial indices with single level pressure measurements and doppler recording.  COMPARISON:  None available. FINDINGS: Right ABI:  Noncompressible Left ABI:  Noncompressible Right Lower Extremity: Posterior tibial and dorsalis pedis artery waveforms are monophasic. Left Lower Extremity: Posterior tibial and dorsalis pedis artery waveforms are monophasic. > 1.4 Non diagnostic secondary to incompressible vessel calcifications (medial arterial sclerosis of [place]) IMPRESSION: Nondiagnostic evaluation of the lower extremities due to incompressible vessels. Electronically Signed   By: Aliene Lloyd M.D.   On: 05/09/2024 13:05   MR FOOT LEFT WO CONTRAST Result Date: 05/09/2024 CLINICAL DATA:  Diabetic foot wound.  Concern for osteomyelitis. EXAM: MRI OF  THE LEFT FOOT WITHOUT CONTRAST TECHNIQUE: Multiplanar, multisequence MR imaging of the left foot was performed. No intravenous contrast was administered. COMPARISON:  left foot radiographs dated 05/09/2024. FINDINGS: Bones/Joint/Cartilage Marrow edema with corresponding T1 hypointensity of the fifth proximal phalanx and the fifth metatarsal head and neck at the level of the fifth MTP joint is compatible with osteomyelitis/septic arthritis. Trace fifth MTP joint effusion. Overlying cutaneous wound. No convincing marrow signal abnormality identified elsewhere to suggest osteomyelitis. Prior amputation of the great toe proximal and distal phalanges. Flattening of the second metatarsal head with underlying serpiginous appearing curvilinear T1 and T2 hypo and hyperintense signal, suggestive of avascular necrosis. Mild degenerative arthropathy of the midfoot. Ligaments Lisfranc ligament is intact. Muscles and Tendons Diffuse atrophy of the intrinsic musculature of the foot likely reflects chronic denervation changes. No significant tenosynovitis. Soft tissue Cutaneous irregularity/wound at the plantar lateral forefoot, overlying the level of the fifth MTP joint. No abscess. Subcutaneous edema along the dorsal foot. IMPRESSION: 1. Marrow edema with  corresponding T1 hypointensity of the fifth proximal phalanx and the fifth metatarsal head and neck at the level of the fifth MTP joint is compatible with osteomyelitis/septic arthritis. Trace fifth MTP joint effusion. Overlying cutaneous wound. No abscess. 2. Flattening of the second metatarsal head with findings suggestive of avascular necrosis. 3. Prior amputation of the great toe proximal and distal phalanges. 4. Chronic denervation changes of the intrinsic foot musculature. Electronically Signed   By: Harrietta Sherry M.D.   On: 05/09/2024 11:14   DG Foot Complete Left Result Date: 05/08/2024 EXAM: 3 or more VIEW(S) XRAY OF THE LEFT FOOT 05/08/2024 04:51:13 PM COMPARISON: 11/15/2023 CLINICAL HISTORY: Diabetic foot wound, eval for e/o osteomyelitis. FINDINGS: BONES AND JOINTS: Previous resection of the great toe. Chronic mixed lucency and sclerosis involving the second metatarsal head. No frank erosions or bone destruction. No fracture. SOFT TISSUES: Questionable wound involving the plantar aspect of the forefoot, seen only on the lateral view. Soft tissue edema of the forefoot. Advanced vascular calcifications. IMPRESSION: 1. No radiographic findings of osteomyelitis. 2. Chronic mixed lucency and sclerosis involving the second metatarsal head. 3. Questionable wound involving the plantar aspect of the forefoot, seen only on the lateral view. Soft tissue edema of the forefoot. Electronically signed by: Andrea Gasman MD 05/08/2024 05:16 PM EDT RP Workstation: [id number]   DG Chest Portable 1 View Result Date: 05/08/2024 CLINICAL DATA:  Cough. EXAM: PORTABLE CHEST 1 VIEW COMPARISON:  01/22/2021. FINDINGS: The heart size and mediastinal contours are within normal limits. 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 No focal consolidation, pleural effusion, or pneumothorax. Partially visualized cervical fusion hardware. No acute osseous abnormality.  IMPRESSION: 1. No acute cardiopulmonary findings. 2. 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 Electronically Signed   By: Harrietta Sherry M.D.   On: 05/08/2024 15:08    Microbiology: Results for orders placed or performed during the hospital encounter of 05/08/24  Blood Culture (routine x 2)     Status: None   Collection Time: 05/08/24  3:55 PM   Specimen: BLOOD  Result Value Ref Range Status   Specimen Description BLOOD RIGHT ANTECUBITAL  Final   Special Requests   Final    BOTTLES DRAWN AEROBIC AND ANAEROBIC Blood Culture results may not be optimal due to an inadequate volume of blood received in culture bottles   Culture   Final    NO GROWTH 5 DAYS Performed at [facility], [address] [place], [place] [address]    Report Status 05/13/2024 FINAL  Final  Blood Culture (routine x 2)     Status: None   Collection Time: 05/08/24  3:55 PM   Specimen: BLOOD  Result Value Ref Range Status   Specimen Description BLOOD BLOOD RIGHT FOREARM  Final   Special Requests   Final    BOTTLES DRAWN AEROBIC ONLY Blood Culture results may not be optimal due to an inadequate volume of blood received in culture bottles   Culture   Final    NO GROWTH 5 DAYS Performed at [facility], [address] [place], [place] [address]    Report Status 05/13/2024 FINAL  Final  Respiratory (~20 pathogens) panel by PCR     Status: None   Collection Time: 05/08/24  3:55 PM   Specimen: Nasopharyngeal Swab; Respiratory  Result Value Ref Range Status   Adenovirus NOT DETECTED NOT DETECTED Final   Coronavirus 229E NOT DETECTED NOT DETECTED Final    Comment: (NOTE) The Coronavirus on the Respiratory Panel, DOES NOT test for the novel  Coronavirus (2019 nCoV)    Coronavirus HKU1 NOT DETECTED NOT DETECTED Final   Coronavirus NL63 NOT DETECTED NOT DETECTED Final   Coronavirus OC43 NOT DETECTED NOT DETECTED Final   Metapneumovirus  NOT DETECTED NOT DETECTED Final   Rhinovirus / Enterovirus NOT DETECTED NOT DETECTED Final   Influenza A NOT DETECTED NOT DETECTED Final   Influenza B NOT DETECTED NOT DETECTED Final   Parainfluenza Virus 1 NOT DETECTED NOT DETECTED Final   Parainfluenza Virus 2 NOT DETECTED NOT DETECTED Final   Parainfluenza Virus 3 NOT DETECTED NOT DETECTED Final   Parainfluenza Virus 4 NOT DETECTED NOT DETECTED Final   Respiratory Syncytial Virus NOT DETECTED NOT DETECTED Final   Bordetella pertussis NOT DETECTED NOT DETECTED Final   Bordetella Parapertussis NOT DETECTED NOT DETECTED Final   Chlamydophila pneumoniae NOT DETECTED NOT DETECTED Final   Mycoplasma pneumoniae NOT DETECTED NOT DETECTED Final    Comment: Performed at [facility], [address], [place], [place] [address]  Aerobic Culture w Gram Stain (superficial specimen)     Status: None   Collection Time: 05/08/24  3:59 PM   Specimen: Wound  Result Value Ref Range Status   Specimen Description   Final    WOUND Performed at [facility], [address] [place], [place] [address]    Special Requests   Final    LEFT FOOT Performed at [facility], [address] [place], [place] [address]    Gram Stain   Final    NO WBC SEEN RARE GRAM POSITIVE COCCI Performed at [facility], [address] [place], [place] [address]    Culture FEW METHICILLIN RESISTANT STAPHYLOCOCCUS AUREUS  Final   Report Status 05/10/2024 FINAL  Final   Organism ID, Bacteria METHICILLIN RESISTANT STAPHYLOCOCCUS AUREUS  Final      Susceptibility   Methicillin resistant staphylococcus aureus - MIC*    CIPROFLOXACIN  >=8 RESISTANT Resistant     ERYTHROMYCIN  >=8 RESISTANT Resistant     GENTAMICIN  <=0.5 SENSITIVE Sensitive     OXACILLIN >=4 RESISTANT Resistant     TETRACYCLINE <=1 SENSITIVE Sensitive     VANCOMYCIN  1 SENSITIVE Sensitive     TRIMETH /SULFA  >=320 RESISTANT Resistant     CLINDAMYCIN <=0.25 SENSITIVE Sensitive      RIFAMPIN <=0.5 SENSITIVE Sensitive     Inducible Clindamycin NEGATIVE Sensitive     LINEZOLID  2 SENSITIVE Sensitive     * FEW METHICILLIN RESISTANT STAPHYLOCOCCUS AUREUS  Aerobic/Anaerobic Culture w Gram Stain (surgical/deep wound)     Status: None (Preliminary result)   Collection Time: 05/11/24  8:47 AM   Specimen: PATH Digit amputation; Tissue  Result Value Ref Range Status   Specimen Description   Final    TISSUE Performed at [facility], [address], [place], [place] [address]    Special Requests   Final    LEFT 5TH METATARSEL HEAD Performed at [facility], [address] [place], [place] [address]    Gram Stain   Final    NO WBC SEEN NO ORGANISMS SEEN Performed at [facility], [address], [place], [place] [address]    Culture   Final    CULTURE REINCUBATED FOR BETTER GROWTH NO ANAEROBES ISOLATED; CULTURE IN PROGRESS FOR 5 DAYS    Report Status PENDING  Incomplete    Labs: CBC: Recent Labs  Lab 05/08/24 1422 05/09/24 0447 05/10/24 0506 05/12/24 0309 05/13/24 0412  WBC 12.1* 9.4 9.2 13.7* 9.9  NEUTROABS 7.9*  --   --   --   --   HGB 14.3 13.0 13.4 13.4 12.2*  HCT 43.2 40.5 42.1 41.5 38.4*  MCV 87.4 88.8 89.4 89.4 89.7  PLT 250 218 265 312 266   Basic Metabolic Panel: Recent Labs  Lab 05/09/24 0447 05/10/24 0506 05/11/24 0204 05/12/24 0309 05/13/24 0412  NA 134* 134* 136 134* 134*  K 4.2 4.5 4.3 4.4 4.0  CL 105 105 105 102 100  CO2 24 22 25 23 28   GLUCOSE 104* 102* 128* 155* 156*  BUN 15 12 14 14 19   CREATININE 1.13 1.20 1.07 1.32* 1.27*  CALCIUM  9.0 9.3 9.2 9.4 8.9   Liver Function Tests: Recent Labs  Lab 05/08/24 1559  AST 23  ALT 15  ALKPHOS 67  BILITOT 1.3*  PROT 7.5  ALBUMIN 3.1*   CBG: Recent Labs  Lab 05/12/24 0738 05/12/24 1150 05/12/24 1616 05/13/24 0601 05/13/24 0801  GLUCAP 145* 120* 150* 124* 104*    Discharge time spent: greater than 30 minutes.  Signed: Laree Lock,  MD [facility] 05/13/2024

## 2024-05-13 NOTE — Progress Notes (Signed)
 Reviewed discharge instructions with patient. Patient acknowledged understanding. Patient did not receive his wheelchair from [employer]. Patient stated he spoke with [employer] and was told he needed to pay a copay with monthly payments before receiving the wheelchair. He stated he would not pay the amount for the wheelchair and asked to be discharged. MD was notified. Patient discharged with personal belongings. Patient wheeled out by staff. Patient transported home via family vehicle. No distress noted in patient.

## 2024-05-13 NOTE — Plan of Care (Signed)
   Problem: Education: Goal: Knowledge of General Education information will improve Description Including pain rating scale, medication(s)/side effects and non-pharmacologic comfort measures Outcome: Progressing

## 2024-05-13 NOTE — Progress Notes (Signed)
 Physical Therapy Treatment Patient Details Name: [name] MRN: [id number] DOB: 1971-05-07 Today's Date: 05/13/2024   History of Present Illness [name] is a 53 y.o. 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 Patient was diagnosed with left foot cellulitis and osteomyelitis. 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 He is NWB on LLE with CAM Boot per podiatry order.    PT Comments  Gait and stair training.  Pt nervous about step training with walker.  Opts for seated up/down and educated on steps and on/off floor.  Will need wheelchair for home.  Has RW access and will purchase knee scooter on his own as needed.     Patient suffers from NWB foot wound which impairs his/her ability to perform daily activities like toileting, feeding, dressing, grooming, bathing in the home. A cane, walker, crutch will not resolve the patient's issue with performing activities of daily living. A lightweight wheelchair and cushion is required/recommended and will allow patient to safely perform daily activities.   Patient can safely propel the wheelchair in the home or has a caregiver who can provide assistance.     If plan is discharge home, recommend the following: A little help with walking and/or transfers;A little help with bathing/dressing/bathroom;Assistance with cooking/housework;Assist for transportation;Help with stairs or ramp for entrance   Can travel by private vehicle        Equipment Recommendations       Recommendations for Other Services        Precautions / Restrictions Precautions Precautions: Fall Recall of Precautions/Restrictions: Intact Restrictions Weight Bearing Restrictions Per Provider Order: Yes LLE Weight Bearing Per Provider Order: Non weight bearing Other Position/Activity Restrictions: with CAM Boot     Mobility  Bed Mobility Overal bed mobility: Independent               Patient Response: Cooperative  Transfers Overall transfer level: Needs assistance Equipment used: Rolling walker (2 wheels) Transfers: Sit to/from Stand Sit to Stand: Supervision, Contact guard assist                Ambulation/Gait Ambulation/Gait assistance: Contact guard assist, Min assist Gait Distance (Feet): 10 Feet Assistive device: Rolling walker (2 wheels) Gait Pattern/deviations: Step-to pattern Gait velocity: decreased         Stairs Stairs: Yes Stairs assistance: Contact guard assist Stair Management: Seated/boosting, Backwards, Forwards Number of Stairs: 4     Wheelchair Mobility     Tilt Bed Tilt Bed Patient Response: Cooperative  Modified Rankin (Stroke Patients Only)       Balance Overall balance assessment: Needs assistance Sitting-balance support: No upper extremity supported, Feet supported Sitting balance-Leahy Scale: Good     Standing balance support: Bilateral upper extremity supported, Reliant on assistive device for balance Standing balance-Leahy Scale: Fair                              Hotel manager: No apparent difficulties  Cognition Arousal: Alert Behavior During Therapy: WFL for tasks assessed/performed   PT - Cognitive impairments: No apparent impairments  Following commands: Intact      Cueing Cueing Techniques: Verbal cues, Visual cues  Exercises      General Comments        Pertinent Vitals/Pain Pain Assessment Pain Assessment: Faces Faces Pain Scale: Hurts a little  bit Pain Location: left foot Pain Descriptors / Indicators: Aching, Sore Pain Intervention(s): Limited activity within patient's tolerance, Monitored during session, Repositioned    Home Living                          Prior Function            PT Goals (current goals can now be found in the care plan section) Progress towards PT goals: Progressing toward goals    Frequency    7X/week      PT Plan      Co-evaluation              AM-PAC PT 6 Clicks Mobility   Outcome Measure  Help needed turning from your back to your side while in a flat bed without using bedrails?: None Help needed moving from lying on your back to sitting on the side of a flat bed without using bedrails?: None Help needed moving to and from a bed to a chair (including a wheelchair)?: A Little Help needed standing up from a chair using your arms (e.g., wheelchair or bedside chair)?: A Little Help needed to walk in hospital room?: A Little Help needed climbing 3-5 steps with a railing? : A Little 6 Click Score: 20    End of Session Equipment Utilized During Treatment: Gait belt Activity Tolerance: Patient tolerated treatment well Patient left: in bed;with call bell/phone within reach Nurse Communication: Mobility status PT Visit Diagnosis: Difficulty in walking, not elsewhere classified (R26.2);Pain;Muscle weakness (generalized) (M62.81) Pain - Right/Left: Left Pain - part of body: Ankle and joints of foot     Time: 1140-1204 PT Time Calculation (min) (ACUTE ONLY): 24 min  Charges:    $Gait Training: 23-37 mins PT General Charges $$ ACUTE PT VISIT: 1 Visit                   Lauraine Gills, PTA 05/13/24, 12:13 PM

## 2024-05-13 NOTE — Significant Event (Addendum)
       CROSS COVER NOTE  NAME: [name] MRN: [id number] DOB : May 12, 1971 ATTENDING PHYSICIAN: Jerelene Critchley, MD    Date of Service   05/13/2024   HPI/Events of Note   Message received from nurs  [name] is a 53 y.o. male with medical history significant for Type 1 diabetes with ESRD s/p kidney and pancreas transplant (10/2022) on tacrolimus , prednisone  and CellCept , no longer on insulin , as well as history of HTN, HLD, OSA being admitted with cellulitis secondary to an infected left plantar foot wound not improving with outpatient antibiotics and debridements with podiatry(last session 04/03/2024) and wound care clinic treatments. Patient was diagnosed with left foot cellulitis and osteomyelitis. 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 Patient hasn't had a bowel movement since 8/4, asked if he could get something to help.   Interventions   Assessment/Plan: Enema ordered refused   Uses dulcolax as needed at home for constipation. Confirmed with RN patient is able to pass gas, no abd pain or vomiting Oral dulcolax dose ordered and prn dulcolax suppository        Erminio LITTIE Cone NP [facility] Cross Cover 7pm-7am - check amion for availability Pager [phone]

## 2024-05-13 NOTE — Plan of Care (Signed)
  Problem: Education: Goal: Knowledge of General Education information will improve Description: Including pain rating scale, medication(s)/side effects and non-pharmacologic comfort measures 05/13/2024 0804 by Franceen Mattock D, LPN Outcome: Progressing 05/12/2024 2022 by Franceen Mattock D, LPN Outcome: Progressing   Problem: Activity: Goal: Risk for activity intolerance will decrease 05/13/2024 0804 by Franceen Mattock D, LPN Outcome: Progressing 05/12/2024 2022 by Franceen Mattock D, LPN Outcome: Progressing   Problem: Nutrition: Goal: Adequate nutrition will be maintained 05/13/2024 0804 by Franceen Mattock D, LPN Outcome: Progressing 05/12/2024 2022 by Franceen Mattock D, LPN Outcome: Progressing   Problem: Safety: Goal: Ability to remain free from injury will improve 05/13/2024 0804 by Franceen Mattock D, LPN Outcome: Progressing 05/12/2024 2022 by Franceen Mattock D, LPN Outcome: Progressing   Problem: Skin Integrity: Goal: Risk for impaired skin integrity will decrease Outcome: Progressing

## 2024-05-13 NOTE — Progress Notes (Signed)
 Patient asks to see charge nurse, understandably unhappy about DME not being delivered yet. Patient is NWB so patient needs wheelchair for discharge. Nurse sent secure chat to CM with no response looking for number for [employer]. Nurse looked up number for [employer] - [phone], nurse called and held for after hours rep, rep took all information and stated a delivery driver would return nurses call within 30 minutes. Nurse expressed this to patient and requested patient please wait at least the thirty minutes so nurse could speak to driver to get an estimate on delivery. Patient stated he would wait 30 minutes.

## 2024-05-13 NOTE — Progress Notes (Signed)
 PODIATRY PROGRESS NOTE Patient Name: [name]  DOB Oct 03, 1971 DOA 05/08/2024  Hospital Day: 6  Assessment:  53 y.o. male with medical history significant for Type 1 diabetes with ESRD s/p kidney and pancreas transplant (10/2022) on tacrolimus , prednisone  and CellCept , no longer on insulin , as well as history of HTN, HLD, OSA status post left foot transmetatarsal amputation with tendo Achilles lengthening procedure with Dr. Malvin on 8/8. Concern for left achilles tendon rupture  AF, VSS  WBC: 13.7  Wound/Bone Cultures:Pending Preliminary wound cultures did grow few MRSA  Imaging: Left foot 2 views 8/8 COMPARISON:  05/08/2024   FINDINGS: Interval transmetatarsal amputation toes 1 through 5. Skin staples over the distal soft tissues. Remainder the exam is unchanged.   IMPRESSION: Interval transmetatarsal amputation toes 1 through 5.     Electronically Signed   By: Toribio Agreste M.D.   On: 05/11/2024 15:14  MRI left ankle ordered Plan:  - S/p above procedures - Exam suggestive of left achilles rupture, obtain MRI prior to DC - If MRI confirms rupture, did discuss operative vs non operative treatment. I do recommend non operative treatment given overall clinical picture and did discuss similar outcomes between the two options. He is in agreement, can discuss this further during outpatient follow up.  - Strict NWB in CAM boot. Heel padding applied to keep foot in slight plantarflexion. Keep boot applied at all times. - Definitive cultures pending, anticipate 7 days PO doxycline for discharge - Dressings reapplied today with xeroform to incisions, 4 x 4 gauze, abd, kerlix, ace wrap. - Patient is requesting to follow up with Dr. Janit in [facility]. Follow up within 1 week of discharge - Recommend course of Percocet for postoperative pain Will continue to follow        Ethan LITTIE Saddler, DPM [facility]    Subjective:  Patient  seen resting comfortably bedside.  Pain well-controlled. Dressings clean, dry and intact.  Did report feeling a small pop posterior left leg yesterday.  He has some concerns about the Achilles.  Objective:   Vitals:   05/13/24 0443 05/13/24 0720  BP: (!) 130/58 (!) 169/69  Pulse: 68 73  Resp: 16 17  Temp: 98.2 F (36.8 C) 97.8 F (36.6 C)  SpO2: 97% 97%       Latest Ref Rng & Units 05/13/2024    4:12 AM 05/12/2024    3:09 AM 05/10/2024    5:06 AM  CBC  WBC 4.0 - 10.5 K/uL 9.9  13.7  9.2   Hemoglobin 13.0 - 17.0 g/dL 87.7  86.5  86.5   Hematocrit 39.0 - 52.0 % 38.4  41.5  42.1   Platelets 150 - 400 K/uL 266  312  265        Latest Ref Rng & Units 05/13/2024    4:12 AM 05/12/2024    3:09 AM 05/11/2024    2:04 AM  BMP  Glucose 70 - 99 mg/dL 843  844  871   BUN 6 - 20 mg/dL 19  14  14    Creatinine 0.61 - 1.24 mg/dL 8.72  8.67  8.92   Sodium 135 - 145 mmol/L 134  134  136   Potassium 3.5 - 5.1 mmol/L 4.0  4.4  4.3   Chloride 98 - 111 mmol/L 100  102  105   CO2 22 - 32 mmol/L 28  23  25    Calcium  8.9 - 10.3 mg/dL 8.9  9.4  9.2     General: AAOx3, NAD  Lower Extremity Exam  TMA site sutures and skin staples intact without evidence of dehiscence or gapping.  Edema control.  No erythema.  Skin edges well-approximated.  No drainage.  Foot appears warm and well-perfused  TAL site, there is some loosening of the proximal suture but there is good approximation of the proximal percutaneous incision  Does have palpable dell within the watershed area of the left Achilles tendon with positive Thompson test.  Does have intact plantarflexion left lower extremity, somewhat weaker than right lower extremity.    Radiology:  MRI ordered to evaluate for left Achilles rupture

## 2024-05-13 NOTE — Care Management Important Message (Signed)
 Important Message  Patient Details  Name: [name] MRN: [id number] Date of Birth: 24-Jan-1971   Important Message Given:  Yes - Medicare IM     Tarra Pence W, CMA 05/13/2024, 10:50 AM

## 2024-05-13 NOTE — Progress Notes (Signed)
 [employer] returns call saying copay was needed for wheelchair to be delivered. Nurse gave patient number to call for copay [phone]. [employer] stated wheelchair would be released for delivery after payment made.

## 2024-05-13 NOTE — TOC Transition Note (Addendum)
   Transition of Care [facility]) - Discharge Note   Patient Details  Name: [name] MRN: [id number] Date of Birth: 10-02-1971  Transition of Care [facility]) CM/SW Contact:  [place] L Janari Yamada, LCSW Phone Number: 05/13/2024, 12:36 PM   Clinical Narrative:     CSW spoke with patient and offered choice for [facility] and DME. Patient advised of no preferences. CSW advised that based on insurance for [facility], [facility] is accepting patients. Patient was agreeable to this choice.   Patient had no preference for DME agencies. Wheelchair was ordered through [employer].   HH set up with [name]. CSW spoke with [place]. Patient will receive PT/OT and RN.      5:16pm: CSW attempted to contact [employer] to get an ETA on wheelchair. Call went straight to voicemail. CSW sent a text and there has been no response. Medical team notified.    Patient Goals and CMS Choice            Discharge Placement                       Discharge Plan and Services Additional resources added to the After Visit Summary for                                       Social Drivers of Health (SDOH) Interventions SDOH Screenings   Food Insecurity: No Food Insecurity (05/08/2024)  Housing: Low Risk  (05/08/2024)  Transportation Needs: No Transportation Needs (05/08/2024)  Utilities: Not At Risk (05/08/2024)  Financial Resource Strain: Low Risk  (04/09/2024)   Received from [facility]  Social Connections: Socially Integrated (05/08/2024)  Tobacco Use: Low Risk  (05/11/2024)     Readmission Risk Interventions     No data to display

## 2024-05-14 ENCOUNTER — Other Ambulatory Visit: Payer: Self-pay | Admitting: Podiatry

## 2024-05-15 LAB — SURGICAL PATHOLOGY

## 2024-05-16 LAB — AEROBIC/ANAEROBIC CULTURE W GRAM STAIN (SURGICAL/DEEP WOUND): Gram Stain: NONE SEEN

## 2024-05-18 ENCOUNTER — Encounter: Payer: Self-pay | Admitting: Podiatry

## 2024-05-18 ENCOUNTER — Ambulatory Visit (INDEPENDENT_AMBULATORY_CARE_PROVIDER_SITE_OTHER): Admitting: Podiatry

## 2024-05-18 VITALS — Ht 72.0 in | Wt 268.0 lb

## 2024-05-18 DIAGNOSIS — L97422 Non-pressure chronic ulcer of left heel and midfoot with fat layer exposed: Secondary | ICD-10-CM

## 2024-05-18 DIAGNOSIS — E08621 Diabetes mellitus due to underlying condition with foot ulcer: Secondary | ICD-10-CM

## 2024-05-18 MED ORDER — DOXYCYCLINE HYCLATE 100 MG PO TABS: 100.0000 mg | ORAL_TABLET | Freq: Two times a day (BID) | ORAL | 0 refills | Status: AC

## 2024-05-18 MED ORDER — CLINDAMYCIN HCL 300 MG PO CAPS: 300.0000 mg | ORAL_CAPSULE | Freq: Three times a day (TID) | ORAL | 0 refills | Status: DC

## 2024-05-18 NOTE — Progress Notes (Signed)
 Chief Complaint  Patient presents with   Routine Post Op    Pt is here to f/u on left foot due to amputation and rupture achilles tendon.    Subjective:  Patient presents today status post  LT TMA w/ TAL.  Inpatient w/ Dr. Malvin.  DOS: 05/11/2024.  Postoperatively the patient states that he heard an audible pop and MRI was ordered consistent with complete tear of the Achilles tendon.  He presents for further treatment and evaluation  Past Medical History:  Diagnosis Date   Cellulitis    Dialysis patient (HCC)    M, W, F   DKA (diabetic ketoacidosis) (HCC)    Esophageal dysphagia    ESRD (end stage renal disease) (HCC)    Gastroparesis    Herniated cervical disc    HTN (hypertension)    Hypercholesteremia    Hypothyroidism    Morbid obesity (HCC)    OSA (obstructive sleep apnea)    Peritoneal dialysis status (HCC)    PONV (postoperative nausea and vomiting)    after peritoneal dialysis catheter was placed   Presence of insulin  pump    a.) parenteral insulin  admin with pen as of 09/20/2022; no using pump   S/P cardiac cath    a. 10-15 yrs ago at [facility] due to tachycardia, reportedly normal. b. Normal ETT 03/2012.   Sepsis (HCC)    Sinus tachycardia    a. 24-hr Holter 03/2012 - SR, occ PVCs, no VT, avg HR 96bpm.   TIA (transient ischemic attack)    Type 1 diabetes mellitus [facility])     Past Surgical History:  Procedure Laterality Date   A/V FISTULAGRAM Left 09/14/2022   Procedure: A/V Fistulagram;  Surgeon: Jama Cordella MATSU, MD;  Location: [facility] INVASIVE CV LAB;  Service: Cardiovascular;  Laterality: Left;   ACHILLES TENDON LENGTHENING Left 05/11/2024   Procedure: LENGTHENING, TENDON, ACHILLES;  Surgeon: [name], DPM;  Location: [facility] ORS;  Service: Orthopedics/Podiatry;  Laterality: Left;   AV FISTULA PLACEMENT Left 06/30/2022   Procedure: ARTERIOVENOUS (AV) FISTULA CREATION (BRACHIALCEPHALIC);  Surgeon: Jama Cordella MATSU, MD;  Location: [facility] ORS;  Service:  Vascular;  Laterality: Left;   BIOPSY  10/03/2019   Procedure: BIOPSY;  Surgeon: Shila Gustav GAILS, MD;  Location: [facility] ENDOSCOPY;  Service: Endoscopy;;   CATARACT EXTRACTION Bilateral    cervical neck fusion     COLONOSCOPY WITH PROPOFOL  N/A 01/02/2021   Procedure: COLONOSCOPY WITH PROPOFOL ;  Surgeon: Jinny Carmine, MD;  Location: [facility];  Service: Endoscopy;  Laterality: N/A;  priority 4 DIABETIC needs potassium draw   ESOPHAGOGASTRODUODENOSCOPY (EGD) WITH PROPOFOL  N/A 10/03/2019   Procedure: ESOPHAGOGASTRODUODENOSCOPY (EGD) WITH PROPOFOL ;  Surgeon: Shila Gustav GAILS, MD;  Location: [facility];  Service: Endoscopy;  Laterality: N/A;   EYE SURGERY Bilateral    for floaters   HERNIA REPAIR     bilateral   LEFT HEART CATH AND CORONARY ANGIOGRAPHY Left 10/16/2021   Procedure: LEFT HEART CATH AND CORONARY ANGIOGRAPHY;  Surgeon: Darron Deatrice LABOR, MD;  Location: [facility] INVASIVE CV LAB;  Service: Cardiovascular;  Laterality: Left;   LEFT HEART CATHETERIZATION WITH CORONARY ANGIOGRAM N/A 12/18/2013   Procedure: LEFT HEART CATHETERIZATION WITH CORONARY ANGIOGRAM;  Surgeon: [name] M [place], MD;  Location: [facility] CATH LAB;  Service: Cardiovascular;  Laterality: N/A;   PERITONEAL CATHETER INSERTION     PERITONEAL CATHETER REMOVAL     POLYPECTOMY  01/02/2021   Procedure: POLYPECTOMY;  Surgeon: Jinny Carmine, MD;  Location[facility];  Service: Endoscopy;;  REVISON OF ARTERIOVENOUS FISTULA Left 09/24/2022   Procedure: REVISON OF ARTERIOVENOUS FISTULA (BRACHIAL CEPHALIC WITH ARTEGRAFT);  Surgeon: Jama Cordella MATSU, MD;  Location: [facility] ORS;  Service: Vascular;  Laterality: Left;   TRANSMETATARSAL AMPUTATION Left 05/11/2024   Procedure: AMPUTATION, FOOT, TRANSMETATARSAL;  Surgeon: [name], DPM;  Location: [facility] ORS;  Service: Orthopedics/Podiatry;  Laterality: Left;  partial fifth ray    Allergies  Allergen Reactions   Sulfa  Antibiotics Anaphylaxis and Swelling    Pt  is not allergic to iodine , has had iodine  in the past w/o premeds and w/ no problems   Oxycontin  [Oxycodone  Hcl] Nausea And Vomiting   Penicillins Other (See Comments)    Tolerated 1st generation cephalosporin (CEFAZOLIN ) on 05/29/2020 & 06/30/2022 without documented ADRs.   Possible reaction many years ago per patient.  Has had PCN without issues since time of reaction.    Shellfish-Derived Products Swelling    Eyes swell with shellfish Pt is not allergic to iodine , has had iodine  in the past w/o premeds and w/ no problem   Pregabalin  Other (See Comments)    Muscle twitching and jerks    LT foot 05/18/2024  Objective/Physical Exam Neurovascular status intact.  Incision coapted with sutures and staples intact.  There is a small amount of serous drainage however.  No malodor. heavy edema with localized erythema noted to the distal amputation stump.  MR ANKLE LEFT WO CONTRAST  IMPRESSION: There is a complete tear of the Achilles tendon 5 cm from the insertion and retracted a few centimeters. Mild insertional tendinopathy of the Achilles tendon and a very small interstitial tear just proximal to the insertion as well. This is best seen on sagittal sequences. Diffuse muscle atrophy and mild to moderate diffuse myositis to the residual plantar musculature which is likely reactive. Mild degenerative change to the calcaneocuboid joint. Partial imaging of postoperative change to the fourth and fifth metatarsals.   Assessment: 1. s/p TMA w/ TAL LT. DOS: 05/11/2024.  Inpatient Dr. Malvin   Plan of Care:  -Patient was evaluated. - MRI reviewed.  Explained to the patient that the primary concern is healing and resolution of any infection to the amputation stump.  For now the Achilles tendon is stable. -Refill prescription for doxycycline  100 mg twice daily x 10 days -Also prescription for clindamycin  300 mg TID x 10 days based on cultures -Recommend Betadine wet-to-dry dressings every other  day.  Supplies provided -Continue WBAT CAM boot -Return to clinic 1 week   Thresa EMERSON Sar, DPM [facility]  Dr. Thresa EMERSON Sar, DPM    [address] [place], [place] [address]                Office [phone]  Fax [phone]

## 2024-05-24 ENCOUNTER — Telehealth: Payer: Self-pay

## 2024-05-24 NOTE — Telephone Encounter (Signed)
 Just an RICK Rick with [facility] OT called and left a message. Patient's evaluation has been done and determined that the patient does not need OT at this time

## 2024-05-25 ENCOUNTER — Encounter: Payer: Self-pay | Admitting: Podiatry

## 2024-05-25 ENCOUNTER — Ambulatory Visit (INDEPENDENT_AMBULATORY_CARE_PROVIDER_SITE_OTHER): Admitting: Podiatry

## 2024-05-25 VITALS — Ht 72.0 in | Wt 268.0 lb

## 2024-05-25 DIAGNOSIS — E08621 Diabetes mellitus due to underlying condition with foot ulcer: Secondary | ICD-10-CM

## 2024-05-25 DIAGNOSIS — L97422 Non-pressure chronic ulcer of left heel and midfoot with fat layer exposed: Secondary | ICD-10-CM

## 2024-05-25 NOTE — Progress Notes (Signed)
 Chief Complaint  Patient presents with   Routine Post Op    POV 2 DOS 05/11/24 left foot amputation and achilles tendon, pt states everything is going well.    Subjective:  Patient presents today status post  LT TMA w/ TAL.  Inpatient w/ Dr. Malvin.  DOS: 05/11/2024.  Overall the patient is doing well.  He took the antibiotics as prescribed without any issues.  Overall he has noticed significant improvement  Past Medical History:  Diagnosis Date   Cellulitis    Dialysis patient (HCC)    M, W, F   DKA (diabetic ketoacidosis) (HCC)    Esophageal dysphagia    ESRD (end stage renal disease) (HCC)    Gastroparesis    Herniated cervical disc    HTN (hypertension)    Hypercholesteremia    Hypothyroidism    Morbid obesity (HCC)    OSA (obstructive sleep apnea)    Peritoneal dialysis status (HCC)    PONV (postoperative nausea and vomiting)    after peritoneal dialysis catheter was placed   Presence of insulin  pump    a.) parenteral insulin  admin with pen as of 09/20/2022; no using pump   S/P cardiac cath    a. 10-15 yrs ago at [facility] due to tachycardia, reportedly normal. b. Normal ETT 03/2012.   Sepsis (HCC)    Sinus tachycardia    a. 24-hr Holter 03/2012 - SR, occ PVCs, no VT, avg HR 96bpm.   TIA (transient ischemic attack)    Type 1 diabetes mellitus [facility])     Past Surgical History:  Procedure Laterality Date   A/V FISTULAGRAM Left 09/14/2022   Procedure: A/V Fistulagram;  Surgeon: Jama Cordella MATSU, MD;  Location: [facility];  Service: Cardiovascular;  Laterality: Left;   ACHILLES TENDON LENGTHENING Left 05/11/2024   Procedure: LENGTHENING, TENDON, ACHILLES;  Surgeon: [name], DPM;  Location: [facility];  Service: Orthopedics/Podiatry;  Laterality: Left;   AV FISTULA PLACEMENT Left 06/30/2022   Procedure: ARTERIOVENOUS (AV) FISTULA CREATION (BRACHIALCEPHALIC);  Surgeon: Jama Cordella MATSU, MD;  Location: [facility];  Service: Vascular;  Laterality: Left;    BIOPSY  10/03/2019   Procedure: BIOPSY;  Surgeon: Shila Gustav GAILS, MD;  Location: [facility];  Service: Endoscopy;;   CATARACT EXTRACTION Bilateral    cervical neck fusion     COLONOSCOPY WITH PROPOFOL  N/A 01/02/2021   Procedure: COLONOSCOPY WITH PROPOFOL ;  Surgeon: Jinny Carmine, MD;  Location: [facility];  Service: Endoscopy;  Laterality: N/A;  priority 4 DIABETIC needs potassium draw   ESOPHAGOGASTRODUODENOSCOPY (EGD) WITH PROPOFOL  N/A 10/03/2019   Procedure: ESOPHAGOGASTRODUODENOSCOPY (EGD) WITH PROPOFOL ;  Surgeon: Shila Gustav GAILS, MD;  Location: [facility];  Service: Endoscopy;  Laterality: N/A;   EYE SURGERY Bilateral    for floaters   HERNIA REPAIR     bilateral   LEFT HEART CATH AND CORONARY ANGIOGRAPHY Left 10/16/2021   Procedure: LEFT HEART CATH AND CORONARY ANGIOGRAPHY;  Surgeon: Darron Deatrice LABOR, MD;  Location: [facility];  Service: Cardiovascular;  Laterality: Left;   LEFT HEART CATHETERIZATION WITH CORONARY ANGIOGRAM N/A 12/18/2013   Procedure: LEFT HEART CATHETERIZATION WITH CORONARY ANGIOGRAM;  Surgeon: [name] M Swaziland, MD;  Location: [facility] CATH LAB;  Service: Cardiovascular;  Laterality: N/A;   PERITONEAL CATHETER INSERTION     PERITONEAL CATHETER REMOVAL     POLYPECTOMY  01/02/2021   Procedure: POLYPECTOMY;  Surgeon: Jinny Carmine, MD;  Location: [facility];  Service: Endoscopy;;   REVISON OF ARTERIOVENOUS FISTULA  Left 09/24/2022   Procedure: REVISON OF ARTERIOVENOUS FISTULA (BRACHIAL CEPHALIC WITH ARTEGRAFT);  Surgeon: Jama Cordella MATSU, MD;  Location: [facility];  Service: Vascular;  Laterality: Left;   TRANSMETATARSAL AMPUTATION Left 05/11/2024   Procedure: AMPUTATION, FOOT, TRANSMETATARSAL;  Surgeon: [name], DPM;  Location: [facility];  Service: Orthopedics/Podiatry;  Laterality: Left;  partial fifth ray    Allergies  Allergen Reactions   Sulfa  Antibiotics Anaphylaxis and Swelling    Pt is not allergic to iodine , has  had iodine  in the past w/o premeds and w/ no problems   Oxycontin  [Oxycodone  Hcl] Nausea And Vomiting   Penicillins Other (See Comments)    Tolerated 1st generation cephalosporin (CEFAZOLIN ) on 05/29/2020 & 06/30/2022 without documented ADRs.   Possible reaction many years ago per patient.  Has had PCN without issues since time of reaction.    Shellfish-Derived Products Swelling    Eyes swell with shellfish Pt is not allergic to iodine , has had iodine  in the past w/o premeds and w/ no problem   Pregabalin  Other (See Comments)    Muscle twitching and jerks    LT foot 05/18/2024   Objective/Physical Exam Significant reduction of the erythema and edema around the amputation site.  Sutures and staples are intact with incision site well coapted.  Overall well-healing surgical foot with resolution of the surrounding cellulitis from last visit  MR ANKLE LEFT WO CONTRAST  IMPRESSION: There is a complete tear of the Achilles tendon 5 cm from the insertion and retracted a few centimeters. Mild insertional tendinopathy of the Achilles tendon and a very small interstitial tear just proximal to the insertion as well. This is best seen on sagittal sequences. Diffuse muscle atrophy and mild to moderate diffuse myositis to the residual plantar musculature which is likely reactive. Mild degenerative change to the calcaneocuboid joint. Partial imaging of postoperative change to the fourth and fifth metatarsals.   Assessment: 1. s/p TMA w/ TAL LT. DOS: 05/11/2024.  Inpatient Dr. Malvin   Plan of Care:  -Patient was evaluated. - Continue doxycycline  100 mg twice daily x 10 days and clindamycin  300 mg TID x 10 days until completed - Continue Betadine wet-to-dry dressings every other day.  Supplies provided -Continue WBAT CAM boot -Return to clinic 2 weeks suture and staple removal   Thresa EMERSON Sar, DPM [facility]  Dr. Thresa EMERSON Sar, DPM    [address] [place], [place] [address]                Office [phone]  Fax [phone]

## 2024-06-08 ENCOUNTER — Encounter: Payer: Self-pay | Admitting: Podiatry

## 2024-06-08 ENCOUNTER — Encounter: Admitting: Podiatry

## 2024-06-08 ENCOUNTER — Telehealth: Payer: Self-pay | Admitting: Lab

## 2024-06-08 ENCOUNTER — Ambulatory Visit (INDEPENDENT_AMBULATORY_CARE_PROVIDER_SITE_OTHER): Admitting: Podiatry

## 2024-06-08 VITALS — Ht 72.0 in | Wt 268.0 lb

## 2024-06-08 DIAGNOSIS — Z89432 Acquired absence of left foot: Secondary | ICD-10-CM

## 2024-06-08 NOTE — Progress Notes (Signed)
 Chief Complaint  Patient presents with   Routine Post Op    POV #3 DOS 05/11/24 left foot amputation and achilles tendon, pt states everything is going well, still continues to wear cam boot while ambulating.    Subjective:  Patient presents today status post  LT TMA w/ TAL.  Inpatient w/ Dr. Malvin.  DOS: 05/11/2024.  No new complaints  Past Medical History:  Diagnosis Date   Cellulitis    Dialysis patient (HCC)    M, W, F   DKA (diabetic ketoacidosis) (HCC)    Esophageal dysphagia    ESRD (end stage renal disease) (HCC)    Gastroparesis    Herniated cervical disc    HTN (hypertension)    Hypercholesteremia    Hypothyroidism    Morbid obesity (HCC)    OSA (obstructive sleep apnea)    Peritoneal dialysis status (HCC)    PONV (postoperative nausea and vomiting)    after peritoneal dialysis catheter was placed   Presence of insulin  pump    a.) parenteral insulin  admin with pen as of 09/20/2022; no using pump   S/P cardiac cath    a. 10-15 yrs ago at [facility] due to tachycardia, reportedly normal. b. Normal ETT 03/2012.   Sepsis (HCC)    Sinus tachycardia    a. 24-hr Holter 03/2012 - SR, occ PVCs, no VT, avg HR 96bpm.   TIA (transient ischemic attack)    Type 1 diabetes mellitus [facility])     Past Surgical History:  Procedure Laterality Date   A/V FISTULAGRAM Left 09/14/2022   Procedure: A/V Fistulagram;  Surgeon: Jama Cordella MATSU, MD;  Location: [facility];  Service: Cardiovascular;  Laterality: Left;   ACHILLES TENDON LENGTHENING Left 05/11/2024   Procedure: LENGTHENING, TENDON, ACHILLES;  Surgeon: [name], DPM;  Location: [facility];  Service: Orthopedics/Podiatry;  Laterality: Left;   AV FISTULA PLACEMENT Left 06/30/2022   Procedure: ARTERIOVENOUS (AV) FISTULA CREATION (BRACHIALCEPHALIC);  Surgeon: Jama Cordella MATSU, MD;  Location: [facility];  Service: Vascular;  Laterality: Left;   BIOPSY  10/03/2019   Procedure: BIOPSY;  Surgeon: Shila Gustav GAILS,  MD;  [facility];  Service: Endoscopy;;   CATARACT EXTRACTION Bilateral    cervical neck fusion     COLONOSCOPY WITH PROPOFOL  N/A 01/02/2021   Procedure: COLONOSCOPY WITH PROPOFOL ;  Surgeon: Jinny Carmine, MD;  Location: [facility];  Service: Endoscopy;  Laterality: N/A;  priority 4 DIABETIC needs potassium draw   ESOPHAGOGASTRODUODENOSCOPY (EGD) WITH PROPOFOL  N/A 10/03/2019   Procedure: ESOPHAGOGASTRODUODENOSCOPY (EGD) WITH PROPOFOL ;  Surgeon: Shila Gustav GAILS, MD;  [facility];  Service: Endoscopy;  Laterality: N/A;   EYE SURGERY Bilateral    for floaters   HERNIA REPAIR     bilateral   LEFT HEART CATH AND CORONARY ANGIOGRAPHY Left 10/16/2021   Procedure: LEFT HEART CATH AND CORONARY ANGIOGRAPHY;  Surgeon: Darron Deatrice LABOR, MD;  Location: [facility];  Service: Cardiovascular;  Laterality: Left;   LEFT HEART CATHETERIZATION WITH CORONARY ANGIOGRAM N/A 12/18/2013   Procedure: LEFT HEART CATHETERIZATION WITH CORONARY ANGIOGRAM;  Surgeon: [name] M Swaziland, MD;  Location: [facility] CATH LAB;  Service: Cardiovascular;  Laterality: N/A;   PERITONEAL CATHETER INSERTION     PERITONEAL CATHETER REMOVAL     POLYPECTOMY  01/02/2021   Procedure: POLYPECTOMY;  Surgeon: Jinny Carmine, MD;  Location: [facility];  Service: Endoscopy;;   REVISON OF ARTERIOVENOUS FISTULA Left 09/24/2022   Procedure: REVISON OF ARTERIOVENOUS FISTULA (BRACHIAL CEPHALIC WITH  ARTEGRAFT);  Surgeon: Jama Cordella MATSU, MD;  Location: [facility];  Service: Vascular;  Laterality: Left;   TRANSMETATARSAL AMPUTATION Left 05/11/2024   Procedure: AMPUTATION, FOOT, TRANSMETATARSAL;  Surgeon: [name], DPM;  Location: [facility];  Service: Orthopedics/Podiatry;  Laterality: Left;  partial fifth ray    Allergies  Allergen Reactions   Sulfa  Antibiotics Anaphylaxis and Swelling    Pt is not allergic to iodine , has had iodine  in the past w/o premeds and w/ no problems   Oxycontin   [Oxycodone  Hcl] Nausea And Vomiting   Penicillins Other (See Comments)    Tolerated 1st generation cephalosporin (CEFAZOLIN ) on 05/29/2020 & 06/30/2022 without documented ADRs.   Possible reaction many years ago per patient.  Has had PCN without issues since time of reaction.    Shellfish-Derived Products Swelling    Eyes swell with shellfish Pt is not allergic to iodine , has had iodine  in the past w/o premeds and w/ no problem   Pregabalin  Other (See Comments)    Muscle twitching and jerks    LT foot 05/18/2024   Objective/Physical Exam No erythema or edema around the amputation site.  Sutures and staples are intact.  No drainage.  Clinically there is no indication of persistent infection  MR ANKLE LEFT WO CONTRAST  IMPRESSION: There is a complete tear of the Achilles tendon 5 cm from the insertion and retracted a few centimeters. Mild insertional tendinopathy of the Achilles tendon and a very small interstitial tear just proximal to the insertion as well. This is best seen on sagittal sequences. Diffuse muscle atrophy and mild to moderate diffuse myositis to the residual plantar musculature which is likely reactive. Mild degenerative change to the calcaneocuboid joint. Partial imaging of postoperative change to the fourth and fifth metatarsals.   Assessment: 1. s/p TMA w/ TAL LT. DOS: 05/11/2024.  Inpatient Dr. Malvin   Plan of Care:  -Patient was evaluated. -completed course of doxycycline  100 mg twice daily x 10 days and clindamycin  300 mg TID x 10 days - Staples and sutures removed.  After removal of the sutures and staples there are some very focal small areas of dehiscence that have not healed along the incision site.  They appear very superficial and stable -Silvadene  cream provided for the patient to apply over the amputation site daily with nonadherent gauze and Ace wrap.  Supplies provided -Continue WBAT cam boot.  New cam boot dispensed today -Return to clinic 3 weeks  follow-up x-ray LT foot   Thresa EMERSON Sar, DPM [facility]  Dr. Thresa EMERSON Sar, DPM    [address] [place], [place] [address]                Office [phone]  Fax [phone]

## 2024-06-08 NOTE — Telephone Encounter (Signed)
 Home health is calling to inform us  that patient is refusing any services from home health agency and wants to know if we can discontinue his services please contact [phone].

## 2024-06-08 NOTE — Telephone Encounter (Signed)
 Left message with S[facility] to discontinue home health services since patient is refusing services .

## 2024-07-06 ENCOUNTER — Other Ambulatory Visit

## 2024-07-06 ENCOUNTER — Encounter: Admitting: Podiatry

## 2024-07-13 ENCOUNTER — Ambulatory Visit (INDEPENDENT_AMBULATORY_CARE_PROVIDER_SITE_OTHER)

## 2024-07-13 ENCOUNTER — Ambulatory Visit (INDEPENDENT_AMBULATORY_CARE_PROVIDER_SITE_OTHER): Admitting: Podiatry

## 2024-07-13 ENCOUNTER — Encounter: Payer: Self-pay | Admitting: Podiatry

## 2024-07-13 VITALS — Ht 72.0 in | Wt 268.0 lb

## 2024-07-13 DIAGNOSIS — Z89432 Acquired absence of left foot: Secondary | ICD-10-CM | POA: Diagnosis not present

## 2024-07-13 NOTE — Progress Notes (Signed)
 Chief Complaint  Patient presents with   Routine Post Op    POV #4 DOS 05/11/24 left foot amputation, pt is here to f/u on the left foot he states everything is going well has no complaints.      Subjective:  Patient presents today status post  LT TMA w/ TAL.  Inpatient w/ Dr. Malvin.  DOS: 05/11/2024.  Doing well.  No new complaints.  WBAT CAM boot  Past Medical History:  Diagnosis Date   Cellulitis    Dialysis patient    M, W, F   DKA (diabetic ketoacidosis) (HCC)    Esophageal dysphagia    ESRD (end stage renal disease) (HCC)    Gastroparesis    Herniated cervical disc    HTN (hypertension)    Hypercholesteremia    Hypothyroidism    Morbid obesity (HCC)    OSA (obstructive sleep apnea)    Peritoneal dialysis status    PONV (postoperative nausea and vomiting)    after peritoneal dialysis catheter was placed   Presence of insulin  pump    a.) parenteral insulin  admin with pen as of 09/20/2022; no using pump   S/P cardiac cath    a. 10-15 yrs ago at [facility] due to tachycardia, reportedly normal. b. Normal ETT 03/2012.   Sepsis (HCC)    Sinus tachycardia    a. 24-hr Holter 03/2012 - SR, occ PVCs, no VT, avg HR 96bpm.   TIA (transient ischemic attack)    Type 1 diabetes mellitus [facility])     Past Surgical History:  Procedure Laterality Date   A/V FISTULAGRAM Left 09/14/2022   Procedure: A/V Fistulagram;  Surgeon: Jama Cordella MATSU, MD;  Location: [facility];  Service: Cardiovascular;  Laterality: Left;   ACHILLES TENDON LENGTHENING Left 05/11/2024   Procedure: LENGTHENING, TENDON, ACHILLES;  Surgeon: [name], DPM;  Location: [facility];  Service: Orthopedics/Podiatry;  Laterality: Left;   AV FISTULA PLACEMENT Left 06/30/2022   Procedure: ARTERIOVENOUS (AV) FISTULA CREATION (BRACHIALCEPHALIC);  Surgeon: Jama Cordella MATSU, MD;  Location: [facility];  Service: Vascular;  Laterality: Left;   BIOPSY  10/03/2019   Procedure: BIOPSY;  Surgeon: Shila Gustav GAILS, MD;  [facility];  Service: Endoscopy;;   CATARACT EXTRACTION Bilateral    cervical neck fusion     COLONOSCOPY WITH PROPOFOL  N/A 01/02/2021   Procedure: COLONOSCOPY WITH PROPOFOL ;  Surgeon: Jinny Carmine, MD;  Location: [facility];  Service: Endoscopy;  Laterality: N/A;  priority 4 DIABETIC needs potassium draw   ESOPHAGOGASTRODUODENOSCOPY (EGD) WITH PROPOFOL  N/A 10/03/2019   Procedure: ESOPHAGOGASTRODUODENOSCOPY (EGD) WITH PROPOFOL ;  Surgeon: Shila Gustav GAILS, MD;  Location: [facility];  Service: Endoscopy;  Laterality: N/A;   EYE SURGERY Bilateral    for floaters   HERNIA REPAIR     bilateral   LEFT HEART CATH AND CORONARY ANGIOGRAPHY Left 10/16/2021   Procedure: LEFT HEART CATH AND CORONARY ANGIOGRAPHY;  Surgeon: Darron Deatrice LABOR, MD;  Location: [facility];  Service: Cardiovascular;  Laterality: Left;   LEFT HEART CATHETERIZATION WITH CORONARY ANGIOGRAM N/A 12/18/2013   Procedure: LEFT HEART CATHETERIZATION WITH CORONARY ANGIOGRAM;  Surgeon: [name] M Swaziland, MD;  Location: [facility] C[facility];  Service: Cardiovascular;  Laterality: N/A;   PERITONEAL CATHETER INSERTION     PERITONEAL CATHETER REMOVAL     POLYPECTOMY  01/02/2021   Procedure: POLYPECTOMY;  Surgeon: Jinny Carmine, MD;  Location: [facility];  Service: Endoscopy;;   REVISON OF ARTERIOVENOUS FISTULA Left 09/24/2022  Procedure: REVISON OF ARTERIOVENOUS FISTULA (BRACHIAL CEPHALIC WITH ARTEGRAFT);  Surgeon: Jama Cordella MATSU, MD;  Location: [facility];  Service: Vascular;  Laterality: Left;   TRANSMETATARSAL AMPUTATION Left 05/11/2024   Procedure: AMPUTATION, FOOT, TRANSMETATARSAL;  Surgeon: [name], DPM;  Location: [facility];  Service: Orthopedics/Podiatry;  Laterality: Left;  partial fifth ray    Allergies  Allergen Reactions   Sulfa  Antibiotics Anaphylaxis and Swelling    Pt is not allergic to iodine , has had iodine  in the past w/o premeds and w/ no problems   Oxycontin   [Oxycodone  Hcl] Nausea And Vomiting   Penicillins Other (See Comments)    Tolerated 1st generation cephalosporin (CEFAZOLIN ) on 05/29/2020 & 06/30/2022 without documented ADRs.   Possible reaction many years ago per patient.  Has had PCN without issues since time of reaction.    Shellfish Protein-Containing Drug Products Swelling    Eyes swell with shellfish Pt is not allergic to iodine , has had iodine  in the past w/o premeds and w/ no problem   Pregabalin  Other (See Comments)    Muscle twitching and jerks    LT foot 05/18/2024   Objective/Physical Exam The incision is nicely healed.  No dehiscence.  No erythema or edema noted to the foot.  Muscle strength 5/5 all compartments.  Good plantarflexion against resistance although there was concern with tear of the Achilles tendon  MR ANKLE LEFT WO CONTRAST  IMPRESSION: There is a complete tear of the Achilles tendon 5 cm from the insertion and retracted a few centimeters. Mild insertional tendinopathy of the Achilles tendon and a very small interstitial tear just proximal to the insertion as well. This is best seen on sagittal sequences. Diffuse muscle atrophy and mild to moderate diffuse myositis to the residual plantar musculature which is likely reactive. Mild degenerative change to the calcaneocuboid joint. Partial imaging of postoperative change to the fourth and fifth metatarsals.   Assessment: 1. s/p TMA w/ TAL LT. DOS: 05/11/2024.  Inpatient Dr. Malvin   Plan of Care:  -Patient was evaluated. - Okay to discontinue cam boot -Full activity no restrictions.  Recommend good supportive tennis shoes and sneakers -Maintain good foot hygiene -Return to clinic PRN  Thresa EMERSON Sar, DPM [facility]  Dr. Thresa EMERSON Sar, DPM    [address] [place], [place] [address]                Office [phone]  Fax [phone]
